# Patient Record
Sex: Female | Born: 1950 | Race: White | Hispanic: No | Smoking: Former smoker
Health system: Southern US, Community
[De-identification: ages and names within clinical notes are randomized; demographics above are authoritative.]

## PROBLEM LIST (undated history)

## (undated) DIAGNOSIS — I10 Essential (primary) hypertension: Secondary | ICD-10-CM

## (undated) DIAGNOSIS — Z85118 Personal history of other malignant neoplasm of bronchus and lung: Secondary | ICD-10-CM

## (undated) DIAGNOSIS — I350 Nonrheumatic aortic (valve) stenosis: Principal | ICD-10-CM

## (undated) DIAGNOSIS — R0600 Dyspnea, unspecified: Secondary | ICD-10-CM

## (undated) DIAGNOSIS — M545 Low back pain, unspecified: Secondary | ICD-10-CM

## (undated) DIAGNOSIS — I499 Cardiac arrhythmia, unspecified: Secondary | ICD-10-CM

## (undated) DIAGNOSIS — S99921A Unspecified injury of right foot, initial encounter: Secondary | ICD-10-CM

## (undated) DIAGNOSIS — E785 Hyperlipidemia, unspecified: Secondary | ICD-10-CM

## (undated) HISTORY — DX: Essential (primary) hypertension: I10

## (undated) HISTORY — PX: ANKLE FUSION: SHX881

## (undated) HISTORY — PX: TUBAL LIGATION: SHX77

## (undated) HISTORY — DX: Hyperlipidemia, unspecified: E78.5

## (undated) HISTORY — DX: Morbid (severe) obesity due to excess calories: E66.01

## (undated) HISTORY — DX: Low back pain, unspecified: M54.50

## (undated) HISTORY — DX: Nonrheumatic aortic (valve) stenosis: I35.0

## (undated) HISTORY — PX: TEE WITHOUT CARDIOVERSION: SHX5443

## (undated) HISTORY — DX: Low back pain: M54.5

## (undated) HISTORY — PX: CHOLECYSTECTOMY: SHX55

## (undated) HISTORY — PX: LUNG REMOVAL, PARTIAL: SHX233

## (undated) HISTORY — DX: Personal history of other malignant neoplasm of bronchus and lung: Z85.118

---

## 1997-08-20 ENCOUNTER — Other Ambulatory Visit: Admission: RE | Admit: 1997-08-20 | Discharge: 1997-08-20 | Payer: Self-pay | Admitting: Gynecology

## 1998-11-11 ENCOUNTER — Other Ambulatory Visit: Admission: RE | Admit: 1998-11-11 | Discharge: 1998-11-11 | Payer: Self-pay | Admitting: Gynecology

## 1999-10-28 ENCOUNTER — Other Ambulatory Visit: Admission: RE | Admit: 1999-10-28 | Discharge: 1999-10-28 | Payer: Self-pay | Admitting: Gynecology

## 1999-10-28 ENCOUNTER — Encounter (INDEPENDENT_AMBULATORY_CARE_PROVIDER_SITE_OTHER): Payer: Self-pay

## 1999-11-24 ENCOUNTER — Other Ambulatory Visit: Admission: RE | Admit: 1999-11-24 | Discharge: 1999-11-24 | Payer: Self-pay | Admitting: Gynecology

## 2000-07-05 ENCOUNTER — Other Ambulatory Visit: Admission: RE | Admit: 2000-07-05 | Discharge: 2000-07-05 | Payer: Self-pay | Admitting: Gynecology

## 2000-12-19 ENCOUNTER — Other Ambulatory Visit: Admission: RE | Admit: 2000-12-19 | Discharge: 2000-12-19 | Payer: Self-pay | Admitting: Gynecology

## 2001-07-13 ENCOUNTER — Encounter: Admission: RE | Admit: 2001-07-13 | Discharge: 2001-07-13 | Payer: Self-pay | Admitting: Family Medicine

## 2001-07-13 ENCOUNTER — Encounter: Payer: Self-pay | Admitting: Family Medicine

## 2002-01-08 ENCOUNTER — Other Ambulatory Visit: Admission: RE | Admit: 2002-01-08 | Discharge: 2002-01-08 | Payer: Self-pay | Admitting: Gynecology

## 2002-06-22 ENCOUNTER — Encounter: Admission: RE | Admit: 2002-06-22 | Discharge: 2002-06-22 | Payer: Self-pay | Admitting: Family Medicine

## 2002-06-22 ENCOUNTER — Encounter: Payer: Self-pay | Admitting: Family Medicine

## 2003-07-25 ENCOUNTER — Other Ambulatory Visit: Admission: RE | Admit: 2003-07-25 | Discharge: 2003-07-25 | Payer: Self-pay | Admitting: Gynecology

## 2003-08-27 ENCOUNTER — Encounter: Admission: RE | Admit: 2003-08-27 | Discharge: 2003-08-27 | Payer: Self-pay | Admitting: Gynecology

## 2004-04-13 ENCOUNTER — Observation Stay (HOSPITAL_COMMUNITY): Admission: RE | Admit: 2004-04-13 | Discharge: 2004-04-14 | Payer: Self-pay | Admitting: General Surgery

## 2004-04-13 ENCOUNTER — Encounter (INDEPENDENT_AMBULATORY_CARE_PROVIDER_SITE_OTHER): Payer: Self-pay | Admitting: *Deleted

## 2004-09-07 ENCOUNTER — Other Ambulatory Visit: Admission: RE | Admit: 2004-09-07 | Discharge: 2004-09-07 | Payer: Self-pay | Admitting: Gynecology

## 2004-09-07 ENCOUNTER — Encounter: Admission: RE | Admit: 2004-09-07 | Discharge: 2004-09-07 | Payer: Self-pay | Admitting: Gynecology

## 2004-12-17 ENCOUNTER — Ambulatory Visit (HOSPITAL_COMMUNITY): Admission: RE | Admit: 2004-12-17 | Discharge: 2004-12-17 | Payer: Self-pay | Admitting: Family Medicine

## 2005-11-09 ENCOUNTER — Other Ambulatory Visit: Admission: RE | Admit: 2005-11-09 | Discharge: 2005-11-09 | Payer: Self-pay | Admitting: Gynecology

## 2006-01-10 ENCOUNTER — Ambulatory Visit: Payer: Self-pay | Admitting: Family Medicine

## 2006-02-14 ENCOUNTER — Ambulatory Visit: Payer: Self-pay | Admitting: Family Medicine

## 2006-03-04 ENCOUNTER — Ambulatory Visit: Payer: Self-pay | Admitting: Internal Medicine

## 2006-04-14 ENCOUNTER — Ambulatory Visit: Payer: Self-pay | Admitting: Family Medicine

## 2006-04-14 LAB — CONVERTED CEMR LAB
ALT: 29 units/L (ref 0–40)
Chloride: 104 meq/L (ref 96–112)
Chol/HDL Ratio, serum: 3.1
Cholesterol: 137 mg/dL (ref 0–200)
GFR calc non Af Amer: 69 mL/min
Glomerular Filtration Rate, Af Am: 83 mL/min/{1.73_m2}
Glucose, Bld: 91 mg/dL (ref 70–99)
HDL: 44.1 mg/dL (ref >39.0)
LDL Cholesterol: 78 mg/dL (ref 0–99)
Potassium: 4.2 meq/L (ref 3.5–5.1)
Sodium: 142 meq/L (ref 135–145)
VLDL: 15 mg/dL (ref 0–40)

## 2006-05-06 ENCOUNTER — Ambulatory Visit: Payer: Self-pay | Admitting: Family Medicine

## 2006-05-10 ENCOUNTER — Ambulatory Visit: Payer: Self-pay | Admitting: Cardiology

## 2006-09-15 ENCOUNTER — Encounter: Payer: Self-pay | Admitting: Family Medicine

## 2006-09-15 ENCOUNTER — Encounter (INDEPENDENT_AMBULATORY_CARE_PROVIDER_SITE_OTHER): Payer: Self-pay | Admitting: Family Medicine

## 2006-09-15 ENCOUNTER — Ambulatory Visit: Payer: Self-pay | Admitting: Family Medicine

## 2006-09-15 DIAGNOSIS — E785 Hyperlipidemia, unspecified: Secondary | ICD-10-CM | POA: Insufficient documentation

## 2006-09-15 DIAGNOSIS — I10 Essential (primary) hypertension: Secondary | ICD-10-CM | POA: Insufficient documentation

## 2006-09-15 DIAGNOSIS — M545 Low back pain, unspecified: Secondary | ICD-10-CM | POA: Insufficient documentation

## 2006-11-01 ENCOUNTER — Encounter (INDEPENDENT_AMBULATORY_CARE_PROVIDER_SITE_OTHER): Payer: Self-pay | Admitting: Family Medicine

## 2006-11-01 ENCOUNTER — Other Ambulatory Visit: Admission: RE | Admit: 2006-11-01 | Discharge: 2006-11-01 | Payer: Self-pay | Admitting: Gynecology

## 2006-11-08 ENCOUNTER — Ambulatory Visit: Payer: Self-pay | Admitting: Family Medicine

## 2006-11-09 LAB — CONVERTED CEMR LAB
ALT: 35 units/L (ref 0–35)
AST: 38 units/L — ABNORMAL HIGH (ref 0–37)
BUN: 15 mg/dL (ref 6–23)
CO2: 33 meq/L — ABNORMAL HIGH (ref 19–32)
Calcium: 9.2 mg/dL (ref 8.4–10.5)
Free T4: 0.9 ng/dL (ref 0.6–1.6)
GFR calc Af Amer: 83 mL/min
GFR calc non Af Amer: 69 mL/min
LDL Cholesterol: 66 mg/dL (ref 0–99)
Potassium: 3.3 meq/L — ABNORMAL LOW (ref 3.5–5.1)
Total CHOL/HDL Ratio: 3.1
Triglycerides: 105 mg/dL (ref 0–149)
VLDL: 21 mg/dL (ref 0–40)

## 2006-11-10 ENCOUNTER — Telehealth (INDEPENDENT_AMBULATORY_CARE_PROVIDER_SITE_OTHER): Payer: Self-pay | Admitting: *Deleted

## 2006-11-15 ENCOUNTER — Ambulatory Visit: Payer: Self-pay | Admitting: Cardiology

## 2006-11-16 ENCOUNTER — Telehealth (INDEPENDENT_AMBULATORY_CARE_PROVIDER_SITE_OTHER): Payer: Self-pay | Admitting: *Deleted

## 2006-11-16 DIAGNOSIS — R599 Enlarged lymph nodes, unspecified: Secondary | ICD-10-CM | POA: Insufficient documentation

## 2006-11-17 ENCOUNTER — Ambulatory Visit: Payer: Self-pay | Admitting: Family Medicine

## 2006-11-21 LAB — CONVERTED CEMR LAB: Potassium: 3.8 meq/L (ref 3.5–5.1)

## 2006-12-26 ENCOUNTER — Telehealth (INDEPENDENT_AMBULATORY_CARE_PROVIDER_SITE_OTHER): Payer: Self-pay | Admitting: *Deleted

## 2006-12-29 ENCOUNTER — Ambulatory Visit: Payer: Self-pay | Admitting: Family Medicine

## 2007-01-03 ENCOUNTER — Telehealth (INDEPENDENT_AMBULATORY_CARE_PROVIDER_SITE_OTHER): Payer: Self-pay | Admitting: *Deleted

## 2007-02-02 ENCOUNTER — Ambulatory Visit: Payer: Self-pay | Admitting: Family Medicine

## 2007-03-09 ENCOUNTER — Encounter (INDEPENDENT_AMBULATORY_CARE_PROVIDER_SITE_OTHER): Payer: Self-pay | Admitting: Family Medicine

## 2007-04-06 ENCOUNTER — Ambulatory Visit: Payer: Self-pay | Admitting: Family Medicine

## 2007-04-20 ENCOUNTER — Ambulatory Visit: Payer: Self-pay | Admitting: Family Medicine

## 2007-04-20 LAB — CONVERTED CEMR LAB
ALT: 49 units/L — ABNORMAL HIGH (ref 0–35)
Creatinine, Ser: 0.9 mg/dL (ref 0.4–1.2)
GFR calc Af Amer: 83 mL/min
HDL: 31.6 mg/dL — ABNORMAL LOW (ref >39.0)
Potassium: 4.1 meq/L (ref 3.5–5.1)
Sodium: 143 meq/L (ref 135–145)
Total CHOL/HDL Ratio: 4.7
Triglycerides: 108 mg/dL (ref 0–149)
VLDL: 22 mg/dL (ref 0–40)

## 2007-04-21 ENCOUNTER — Encounter (INDEPENDENT_AMBULATORY_CARE_PROVIDER_SITE_OTHER): Payer: Self-pay | Admitting: *Deleted

## 2007-07-07 ENCOUNTER — Encounter (INDEPENDENT_AMBULATORY_CARE_PROVIDER_SITE_OTHER): Payer: Self-pay | Admitting: *Deleted

## 2007-07-07 ENCOUNTER — Ambulatory Visit: Payer: Self-pay | Admitting: Family Medicine

## 2007-07-07 DIAGNOSIS — B353 Tinea pedis: Secondary | ICD-10-CM | POA: Insufficient documentation

## 2007-07-11 ENCOUNTER — Telehealth (INDEPENDENT_AMBULATORY_CARE_PROVIDER_SITE_OTHER): Payer: Self-pay | Admitting: *Deleted

## 2007-07-27 ENCOUNTER — Encounter: Payer: Self-pay | Admitting: Family Medicine

## 2007-07-31 ENCOUNTER — Telehealth (INDEPENDENT_AMBULATORY_CARE_PROVIDER_SITE_OTHER): Payer: Self-pay | Admitting: *Deleted

## 2007-08-28 ENCOUNTER — Telehealth (INDEPENDENT_AMBULATORY_CARE_PROVIDER_SITE_OTHER): Payer: Self-pay | Admitting: *Deleted

## 2007-09-05 ENCOUNTER — Telehealth (INDEPENDENT_AMBULATORY_CARE_PROVIDER_SITE_OTHER): Payer: Self-pay | Admitting: *Deleted

## 2007-09-07 ENCOUNTER — Ambulatory Visit: Payer: Self-pay | Admitting: Family Medicine

## 2007-09-13 ENCOUNTER — Encounter (INDEPENDENT_AMBULATORY_CARE_PROVIDER_SITE_OTHER): Payer: Self-pay | Admitting: *Deleted

## 2007-09-13 LAB — CONVERTED CEMR LAB: Vit D, 1,25-Dihydroxy: 20 — ABNORMAL LOW (ref 30–89)

## 2007-09-15 ENCOUNTER — Telehealth (INDEPENDENT_AMBULATORY_CARE_PROVIDER_SITE_OTHER): Payer: Self-pay | Admitting: *Deleted

## 2007-09-16 LAB — CONVERTED CEMR LAB: AST: 37 units/L (ref 0–37)

## 2007-09-18 ENCOUNTER — Encounter (INDEPENDENT_AMBULATORY_CARE_PROVIDER_SITE_OTHER): Payer: Self-pay | Admitting: *Deleted

## 2007-10-24 ENCOUNTER — Encounter: Payer: Self-pay | Admitting: Family Medicine

## 2007-11-07 ENCOUNTER — Ambulatory Visit: Payer: Self-pay | Admitting: Family Medicine

## 2007-11-07 ENCOUNTER — Other Ambulatory Visit: Admission: RE | Admit: 2007-11-07 | Discharge: 2007-11-07 | Payer: Self-pay | Admitting: Gynecology

## 2007-11-07 DIAGNOSIS — E559 Vitamin D deficiency, unspecified: Secondary | ICD-10-CM

## 2007-11-09 ENCOUNTER — Telehealth (INDEPENDENT_AMBULATORY_CARE_PROVIDER_SITE_OTHER): Payer: Self-pay | Admitting: *Deleted

## 2007-11-09 LAB — CONVERTED CEMR LAB: Vit D, 1,25-Dihydroxy: 18 — ABNORMAL LOW (ref 30–89)

## 2007-11-13 ENCOUNTER — Ambulatory Visit: Payer: Self-pay | Admitting: Family Medicine

## 2007-11-13 DIAGNOSIS — R05 Cough: Secondary | ICD-10-CM

## 2007-11-13 DIAGNOSIS — R059 Cough, unspecified: Secondary | ICD-10-CM | POA: Insufficient documentation

## 2007-11-13 DIAGNOSIS — E538 Deficiency of other specified B group vitamins: Secondary | ICD-10-CM | POA: Insufficient documentation

## 2007-11-13 LAB — CONVERTED CEMR LAB
Calcium: 9.2 mg/dL (ref 8.4–10.5)
Chloride: 98 meq/L (ref 96–112)
Creatinine, Ser: 1 mg/dL (ref 0.4–1.2)
Folate: 7.3 ng/mL
GFR calc non Af Amer: 61 mL/min
Sodium: 138 meq/L (ref 135–145)
TSH: 2.6 microintl units/mL (ref 0.35–5.50)
Total Bilirubin: 0.7 mg/dL (ref 0.3–1.2)
Vitamin B-12: 277 pg/mL (ref 211–911)

## 2007-11-14 ENCOUNTER — Ambulatory Visit: Payer: Self-pay | Admitting: Family Medicine

## 2007-11-20 ENCOUNTER — Ambulatory Visit: Payer: Self-pay | Admitting: Family Medicine

## 2007-11-23 ENCOUNTER — Ambulatory Visit: Payer: Self-pay | Admitting: Family Medicine

## 2007-11-24 ENCOUNTER — Encounter (INDEPENDENT_AMBULATORY_CARE_PROVIDER_SITE_OTHER): Payer: Self-pay | Admitting: *Deleted

## 2007-11-25 LAB — CONVERTED CEMR LAB
Basophils Absolute: 0 10*3/uL (ref 0.0–0.1)
Bilirubin, Direct: 0.1 mg/dL (ref 0.0–0.3)
Calcium: 9.5 mg/dL (ref 8.4–10.5)
Cholesterol: 178 mg/dL (ref 0–200)
Eosinophils Absolute: 0.1 10*3/uL (ref 0.0–0.7)
GFR calc Af Amer: 83 mL/min
Glucose, Bld: 100 mg/dL — ABNORMAL HIGH (ref 70–99)
HCT: 38.4 % (ref 36.0–46.0)
Hemoglobin: 13.2 g/dL (ref 12.0–15.0)
MCHC: 34.4 g/dL (ref 30.0–36.0)
MCV: 95.8 fL (ref 78.0–100.0)
Monocytes Absolute: 0.5 10*3/uL (ref 0.1–1.0)
Monocytes Relative: 7.1 % (ref 3.0–12.0)
Neutro Abs: 4.4 10*3/uL (ref 1.4–7.7)
RDW: 12.2 % (ref 11.5–14.6)
Sodium: 141 meq/L (ref 135–145)
Total Protein: 7.2 g/dL (ref 6.0–8.3)
Triglycerides: 179 mg/dL — ABNORMAL HIGH (ref 0–149)

## 2007-11-27 ENCOUNTER — Ambulatory Visit: Payer: Self-pay | Admitting: Family Medicine

## 2007-11-27 ENCOUNTER — Encounter (INDEPENDENT_AMBULATORY_CARE_PROVIDER_SITE_OTHER): Payer: Self-pay | Admitting: *Deleted

## 2007-12-04 ENCOUNTER — Ambulatory Visit: Payer: Self-pay | Admitting: Family Medicine

## 2007-12-11 ENCOUNTER — Telehealth (INDEPENDENT_AMBULATORY_CARE_PROVIDER_SITE_OTHER): Payer: Self-pay | Admitting: *Deleted

## 2007-12-11 ENCOUNTER — Ambulatory Visit: Payer: Self-pay | Admitting: Family Medicine

## 2007-12-12 ENCOUNTER — Encounter (INDEPENDENT_AMBULATORY_CARE_PROVIDER_SITE_OTHER): Payer: Self-pay | Admitting: *Deleted

## 2007-12-12 LAB — CONVERTED CEMR LAB
Folate: 8.6 ng/mL
Vitamin B-12: 696 pg/mL (ref 211–911)

## 2008-01-15 ENCOUNTER — Telehealth (INDEPENDENT_AMBULATORY_CARE_PROVIDER_SITE_OTHER): Payer: Self-pay | Admitting: *Deleted

## 2008-02-26 ENCOUNTER — Ambulatory Visit: Payer: Self-pay | Admitting: Family Medicine

## 2008-02-28 ENCOUNTER — Encounter (INDEPENDENT_AMBULATORY_CARE_PROVIDER_SITE_OTHER): Payer: Self-pay | Admitting: *Deleted

## 2008-03-04 ENCOUNTER — Telehealth (INDEPENDENT_AMBULATORY_CARE_PROVIDER_SITE_OTHER): Payer: Self-pay | Admitting: *Deleted

## 2008-04-01 ENCOUNTER — Ambulatory Visit: Payer: Self-pay | Admitting: Family Medicine

## 2008-04-01 DIAGNOSIS — Z8639 Personal history of other endocrine, nutritional and metabolic disease: Secondary | ICD-10-CM

## 2008-04-01 DIAGNOSIS — Z862 Personal history of diseases of the blood and blood-forming organs and certain disorders involving the immune mechanism: Secondary | ICD-10-CM

## 2008-04-02 LAB — CONVERTED CEMR LAB
ALT: 54 units/L — ABNORMAL HIGH (ref 0–35)
AST: 51 units/L — ABNORMAL HIGH (ref 0–37)
Albumin: 4 g/dL (ref 3.5–5.2)
Alkaline Phosphatase: 62 units/L (ref 39–117)
BUN: 26 mg/dL — ABNORMAL HIGH (ref 6–23)
CO2: 31 meq/L (ref 19–32)
Chloride: 101 meq/L (ref 96–112)
Glucose, Bld: 112 mg/dL — ABNORMAL HIGH (ref 70–99)
Potassium: 4.1 meq/L (ref 3.5–5.1)
Total Protein: 7.6 g/dL (ref 6.0–8.3)

## 2008-04-03 ENCOUNTER — Encounter (INDEPENDENT_AMBULATORY_CARE_PROVIDER_SITE_OTHER): Payer: Self-pay | Admitting: *Deleted

## 2008-04-03 ENCOUNTER — Telehealth (INDEPENDENT_AMBULATORY_CARE_PROVIDER_SITE_OTHER): Payer: Self-pay | Admitting: *Deleted

## 2008-04-05 ENCOUNTER — Encounter (INDEPENDENT_AMBULATORY_CARE_PROVIDER_SITE_OTHER): Payer: Self-pay | Admitting: *Deleted

## 2008-04-15 ENCOUNTER — Ambulatory Visit: Payer: Self-pay | Admitting: Cardiovascular Disease

## 2008-04-15 ENCOUNTER — Encounter (INDEPENDENT_AMBULATORY_CARE_PROVIDER_SITE_OTHER): Payer: Self-pay | Admitting: *Deleted

## 2008-04-18 ENCOUNTER — Ambulatory Visit: Payer: Self-pay | Admitting: Family Medicine

## 2008-04-18 DIAGNOSIS — J984 Other disorders of lung: Secondary | ICD-10-CM | POA: Insufficient documentation

## 2008-05-02 ENCOUNTER — Ambulatory Visit: Payer: Self-pay | Admitting: Family Medicine

## 2008-05-16 ENCOUNTER — Ambulatory Visit: Payer: Self-pay | Admitting: Internal Medicine

## 2008-05-20 ENCOUNTER — Encounter (INDEPENDENT_AMBULATORY_CARE_PROVIDER_SITE_OTHER): Payer: Self-pay | Admitting: *Deleted

## 2008-06-20 ENCOUNTER — Ambulatory Visit: Payer: Self-pay | Admitting: Family Medicine

## 2008-06-24 LAB — CONVERTED CEMR LAB
ALT: 46 units/L — ABNORMAL HIGH (ref 0–35)
AST: 46 units/L — ABNORMAL HIGH (ref 0–37)
Bilirubin, Direct: 0.1 mg/dL (ref 0.0–0.3)
CO2: 31 meq/L (ref 19–32)
Calcium: 9.6 mg/dL (ref 8.4–10.5)
Chloride: 103 meq/L (ref 96–112)
GFR calc non Af Amer: 91 mL/min
Sodium: 141 meq/L (ref 135–145)
Total Bilirubin: 0.9 mg/dL (ref 0.3–1.2)
Total CHOL/HDL Ratio: 5

## 2008-06-25 ENCOUNTER — Encounter (INDEPENDENT_AMBULATORY_CARE_PROVIDER_SITE_OTHER): Payer: Self-pay | Admitting: *Deleted

## 2008-07-11 ENCOUNTER — Ambulatory Visit: Payer: Self-pay | Admitting: Family Medicine

## 2008-07-11 DIAGNOSIS — K5289 Other specified noninfective gastroenteritis and colitis: Secondary | ICD-10-CM

## 2008-07-29 ENCOUNTER — Encounter (INDEPENDENT_AMBULATORY_CARE_PROVIDER_SITE_OTHER): Payer: Self-pay | Admitting: *Deleted

## 2008-07-29 LAB — CONVERTED CEMR LAB
Albumin: 3.9 g/dL (ref 3.5–5.2)
Alkaline Phosphatase: 66 units/L (ref 39–117)
BUN: 21 mg/dL (ref 6–23)
Calcium: 9.3 mg/dL (ref 8.4–10.5)
Creatinine, Ser: 1 mg/dL (ref 0.4–1.2)
Eosinophils Absolute: 0.3 10*3/uL (ref 0.0–0.7)
Eosinophils Relative: 3.1 % (ref 0.0–5.0)
GFR calc Af Amer: 73 mL/min
GFR calc non Af Amer: 61 mL/min
Glucose, Bld: 88 mg/dL (ref 70–99)
HCT: 36.9 % (ref 36.0–46.0)
Hemoglobin: 12.6 g/dL (ref 12.0–15.0)
MCV: 96.8 fL (ref 78.0–100.0)
Monocytes Absolute: 0.3 10*3/uL (ref 0.1–1.0)
Neutro Abs: 5.5 10*3/uL (ref 1.4–7.7)
Platelets: 181 10*3/uL (ref 150–400)
Potassium: 4.4 meq/L (ref 3.5–5.1)
TSH: 1.54 microintl units/mL (ref 0.35–5.50)
Total Protein: 7.3 g/dL (ref 6.0–8.3)
WBC: 9.4 10*3/uL (ref 4.5–10.5)

## 2008-08-28 ENCOUNTER — Telehealth (INDEPENDENT_AMBULATORY_CARE_PROVIDER_SITE_OTHER): Payer: Self-pay | Admitting: *Deleted

## 2008-08-28 ENCOUNTER — Ambulatory Visit: Payer: Self-pay | Admitting: Family Medicine

## 2008-09-11 ENCOUNTER — Telehealth (INDEPENDENT_AMBULATORY_CARE_PROVIDER_SITE_OTHER): Payer: Self-pay | Admitting: *Deleted

## 2008-09-18 ENCOUNTER — Ambulatory Visit: Payer: Self-pay | Admitting: Cardiology

## 2008-09-20 ENCOUNTER — Ambulatory Visit: Payer: Self-pay | Admitting: Internal Medicine

## 2008-09-23 ENCOUNTER — Encounter: Payer: Self-pay | Admitting: Internal Medicine

## 2008-09-23 ENCOUNTER — Ambulatory Visit (HOSPITAL_COMMUNITY): Admission: RE | Admit: 2008-09-23 | Discharge: 2008-09-23 | Payer: Self-pay | Admitting: Internal Medicine

## 2008-09-26 ENCOUNTER — Ambulatory Visit: Payer: Self-pay | Admitting: Thoracic Surgery

## 2008-09-26 ENCOUNTER — Ambulatory Visit (HOSPITAL_COMMUNITY): Admission: RE | Admit: 2008-09-26 | Discharge: 2008-09-26 | Payer: Self-pay | Admitting: Thoracic Surgery

## 2008-09-27 ENCOUNTER — Ambulatory Visit: Payer: Self-pay | Admitting: Family Medicine

## 2008-10-01 DIAGNOSIS — Z85118 Personal history of other malignant neoplasm of bronchus and lung: Secondary | ICD-10-CM

## 2008-10-01 HISTORY — DX: Personal history of other malignant neoplasm of bronchus and lung: Z85.118

## 2008-10-18 ENCOUNTER — Encounter: Payer: Self-pay | Admitting: Thoracic Surgery

## 2008-10-18 ENCOUNTER — Inpatient Hospital Stay (HOSPITAL_COMMUNITY): Admission: RE | Admit: 2008-10-18 | Discharge: 2008-10-24 | Payer: Self-pay | Admitting: Thoracic Surgery

## 2008-10-18 ENCOUNTER — Ambulatory Visit: Payer: Self-pay | Admitting: Thoracic Surgery

## 2008-10-30 ENCOUNTER — Ambulatory Visit: Payer: Self-pay | Admitting: Thoracic Surgery

## 2008-10-30 ENCOUNTER — Encounter: Admission: RE | Admit: 2008-10-30 | Discharge: 2008-10-30 | Payer: Self-pay | Admitting: Thoracic Surgery

## 2008-10-31 ENCOUNTER — Ambulatory Visit: Payer: Self-pay | Admitting: Internal Medicine

## 2008-11-12 ENCOUNTER — Encounter: Payer: Self-pay | Admitting: Family Medicine

## 2008-11-12 LAB — COMPREHENSIVE METABOLIC PANEL
CO2: 25 mEq/L (ref 19–32)
Calcium: 9.3 mg/dL (ref 8.4–10.5)
Glucose, Bld: 116 mg/dL — ABNORMAL HIGH (ref 70–99)
Sodium: 141 mEq/L (ref 135–145)
Total Bilirubin: 0.5 mg/dL (ref 0.3–1.2)
Total Protein: 7.7 g/dL (ref 6.0–8.3)

## 2008-11-12 LAB — CBC WITH DIFFERENTIAL/PLATELET
Eosinophils Absolute: 0.5 10*3/uL (ref 0.0–0.5)
HCT: 38.1 % (ref 34.8–46.6)
LYMPH%: 32.7 % (ref 14.0–49.7)
MONO#: 0.4 10*3/uL (ref 0.1–0.9)
NEUT#: 2.9 10*3/uL (ref 1.5–6.5)
NEUT%: 51.8 % (ref 38.4–76.8)
Platelets: 193 10*3/uL (ref 145–400)
WBC: 5.7 10*3/uL (ref 3.9–10.3)

## 2008-11-13 ENCOUNTER — Encounter: Admission: RE | Admit: 2008-11-13 | Discharge: 2008-11-13 | Payer: Self-pay | Admitting: Thoracic Surgery

## 2008-11-13 ENCOUNTER — Ambulatory Visit: Payer: Self-pay | Admitting: Thoracic Surgery

## 2008-11-19 ENCOUNTER — Ambulatory Visit (HOSPITAL_COMMUNITY): Admission: RE | Admit: 2008-11-19 | Discharge: 2008-11-19 | Payer: Self-pay | Admitting: Internal Medicine

## 2008-11-25 ENCOUNTER — Ambulatory Visit: Payer: Self-pay | Admitting: Family Medicine

## 2008-11-25 LAB — CONVERTED CEMR LAB
Alkaline Phosphatase: 57 units/L (ref 39–117)
Bilirubin, Direct: 0 mg/dL (ref 0.0–0.3)
Total Bilirubin: 0.8 mg/dL (ref 0.3–1.2)

## 2008-11-26 ENCOUNTER — Encounter (INDEPENDENT_AMBULATORY_CARE_PROVIDER_SITE_OTHER): Payer: Self-pay | Admitting: *Deleted

## 2008-12-01 LAB — CONVERTED CEMR LAB
HCV Ab: NEGATIVE
Hep A IgM: NEGATIVE
Hep B C IgM: NEGATIVE
Hepatitis B Surface Ag: NEGATIVE
Vit D, 25-Hydroxy: 40 ng/mL (ref 30–89)

## 2008-12-03 ENCOUNTER — Encounter (INDEPENDENT_AMBULATORY_CARE_PROVIDER_SITE_OTHER): Payer: Self-pay | Admitting: *Deleted

## 2008-12-24 ENCOUNTER — Ambulatory Visit: Payer: Self-pay | Admitting: Thoracic Surgery

## 2008-12-24 ENCOUNTER — Encounter: Admission: RE | Admit: 2008-12-24 | Discharge: 2008-12-24 | Payer: Self-pay | Admitting: Thoracic Surgery

## 2009-01-08 ENCOUNTER — Telehealth (INDEPENDENT_AMBULATORY_CARE_PROVIDER_SITE_OTHER): Payer: Self-pay | Admitting: *Deleted

## 2009-03-04 ENCOUNTER — Encounter: Admission: RE | Admit: 2009-03-04 | Discharge: 2009-03-04 | Payer: Self-pay | Admitting: Thoracic Surgery

## 2009-03-04 ENCOUNTER — Ambulatory Visit: Payer: Self-pay | Admitting: Thoracic Surgery

## 2009-03-17 ENCOUNTER — Ambulatory Visit: Payer: Self-pay | Admitting: Family Medicine

## 2009-03-17 DIAGNOSIS — M25569 Pain in unspecified knee: Secondary | ICD-10-CM | POA: Insufficient documentation

## 2009-04-23 ENCOUNTER — Ambulatory Visit: Payer: Self-pay | Admitting: Internal Medicine

## 2009-04-29 ENCOUNTER — Ambulatory Visit (HOSPITAL_COMMUNITY): Admission: RE | Admit: 2009-04-29 | Discharge: 2009-04-29 | Payer: Self-pay | Admitting: Internal Medicine

## 2009-04-29 LAB — COMPREHENSIVE METABOLIC PANEL
CO2: 29 mEq/L (ref 19–32)
Creatinine, Ser: 0.95 mg/dL (ref 0.40–1.20)
Glucose, Bld: 112 mg/dL — ABNORMAL HIGH (ref 70–99)
Total Bilirubin: 0.8 mg/dL (ref 0.3–1.2)

## 2009-04-29 LAB — CBC WITH DIFFERENTIAL/PLATELET
BASO%: 0.5 % (ref 0.0–2.0)
EOS%: 2.7 % (ref 0.0–7.0)
HCT: 37.8 % (ref 34.8–46.6)
LYMPH%: 34 % (ref 14.0–49.7)
MCH: 32.3 pg (ref 25.1–34.0)
MCHC: 34 g/dL (ref 31.5–36.0)
MONO%: 6.9 % (ref 0.0–14.0)
NEUT%: 55.9 % (ref 38.4–76.8)
lymph#: 2.8 10*3/uL (ref 0.9–3.3)

## 2009-05-01 ENCOUNTER — Encounter: Payer: Self-pay | Admitting: Family Medicine

## 2009-05-07 ENCOUNTER — Ambulatory Visit: Payer: Self-pay | Admitting: Thoracic Surgery

## 2009-05-08 ENCOUNTER — Ambulatory Visit: Payer: Self-pay | Admitting: Family Medicine

## 2009-05-13 LAB — CONVERTED CEMR LAB: Vit D, 25-Hydroxy: 50 ng/mL (ref 30–89)

## 2009-07-29 ENCOUNTER — Ambulatory Visit: Payer: Self-pay | Admitting: Family Medicine

## 2009-07-30 ENCOUNTER — Telehealth (INDEPENDENT_AMBULATORY_CARE_PROVIDER_SITE_OTHER): Payer: Self-pay | Admitting: *Deleted

## 2009-07-30 LAB — CONVERTED CEMR LAB
ALT: 62 units/L — ABNORMAL HIGH (ref 0–35)
AST: 57 units/L — ABNORMAL HIGH (ref 0–37)
BUN: 17 mg/dL (ref 6–23)
Basophils Relative: 1.1 % (ref 0.0–3.0)
Bilirubin, Direct: 0.1 mg/dL (ref 0.0–0.3)
Chloride: 103 meq/L (ref 96–112)
Cholesterol: 134 mg/dL (ref 0–200)
Creatinine, Ser: 0.9 mg/dL (ref 0.4–1.2)
Eosinophils Relative: 2.7 % (ref 0.0–5.0)
GFR calc non Af Amer: 67.93 mL/min (ref >60)
HCT: 39.7 % (ref 36.0–46.0)
LDL Cholesterol: 67 mg/dL (ref 0–99)
MCV: 97.2 fL (ref 78.0–100.0)
Monocytes Relative: 7 % (ref 3.0–12.0)
Neutrophils Relative %: 59.9 % (ref 43.0–77.0)
Platelets: 166 10*3/uL (ref 150.0–400.0)
Potassium: 3.7 meq/L (ref 3.5–5.1)
RBC: 4.09 M/uL (ref 3.87–5.11)
Total Bilirubin: 0.6 mg/dL (ref 0.3–1.2)
Total CHOL/HDL Ratio: 3
Total Protein: 8.4 g/dL — ABNORMAL HIGH (ref 6.0–8.3)
VLDL: 22.8 mg/dL (ref 0.0–40.0)
WBC: 6.7 10*3/uL (ref 4.5–10.5)

## 2009-10-09 ENCOUNTER — Ambulatory Visit: Payer: Self-pay | Admitting: Internal Medicine

## 2009-10-13 ENCOUNTER — Ambulatory Visit (HOSPITAL_COMMUNITY): Admission: RE | Admit: 2009-10-13 | Discharge: 2009-10-13 | Payer: Self-pay | Admitting: Internal Medicine

## 2009-10-13 LAB — CBC WITH DIFFERENTIAL/PLATELET
Basophils Absolute: 0 10*3/uL (ref 0.0–0.1)
Eosinophils Absolute: 0.2 10*3/uL (ref 0.0–0.5)
HCT: 39.2 % (ref 34.8–46.6)
LYMPH%: 34.6 % (ref 14.0–49.7)
MCHC: 34.7 g/dL (ref 31.5–36.0)
MONO#: 0.6 10*3/uL (ref 0.1–0.9)
NEUT%: 54.5 % (ref 38.4–76.8)
Platelets: 192 10*3/uL (ref 145–400)
WBC: 7.2 10*3/uL (ref 3.9–10.3)

## 2009-10-13 LAB — COMPREHENSIVE METABOLIC PANEL
BUN: 24 mg/dL — ABNORMAL HIGH (ref 6–23)
CO2: 33 mEq/L — ABNORMAL HIGH (ref 19–32)
Creatinine, Ser: 0.88 mg/dL (ref 0.40–1.20)
Glucose, Bld: 107 mg/dL — ABNORMAL HIGH (ref 70–99)
Total Bilirubin: 0.7 mg/dL (ref 0.3–1.2)

## 2009-10-15 ENCOUNTER — Ambulatory Visit: Payer: Self-pay | Admitting: Thoracic Surgery

## 2009-10-16 ENCOUNTER — Encounter: Payer: Self-pay | Admitting: Family Medicine

## 2009-10-23 ENCOUNTER — Encounter: Payer: Self-pay | Admitting: Family Medicine

## 2010-01-28 ENCOUNTER — Telehealth (INDEPENDENT_AMBULATORY_CARE_PROVIDER_SITE_OTHER): Payer: Self-pay | Admitting: *Deleted

## 2010-02-06 ENCOUNTER — Ambulatory Visit: Payer: Self-pay | Admitting: Family Medicine

## 2010-02-09 LAB — CONVERTED CEMR LAB
BUN: 22 mg/dL (ref 6–23)
Bilirubin, Direct: 0.1 mg/dL (ref 0.0–0.3)
CO2: 34 meq/L — ABNORMAL HIGH (ref 19–32)
Chloride: 103 meq/L (ref 96–112)
Glucose, Bld: 94 mg/dL (ref 70–99)
Potassium: 4.5 meq/L (ref 3.5–5.1)
Total Bilirubin: 0.7 mg/dL (ref 0.3–1.2)

## 2010-02-13 ENCOUNTER — Ambulatory Visit: Payer: Self-pay | Admitting: Family Medicine

## 2010-03-16 ENCOUNTER — Ambulatory Visit: Payer: Self-pay | Admitting: Family Medicine

## 2010-04-23 ENCOUNTER — Ambulatory Visit: Payer: Self-pay | Admitting: Internal Medicine

## 2010-04-24 ENCOUNTER — Ambulatory Visit (HOSPITAL_COMMUNITY)
Admission: RE | Admit: 2010-04-24 | Discharge: 2010-04-24 | Payer: Self-pay | Source: Home / Self Care | Attending: Internal Medicine | Admitting: Internal Medicine

## 2010-04-24 LAB — CMP (CANCER CENTER ONLY)
CO2: 30 mEq/L (ref 18–33)
Calcium: 8.7 mg/dL (ref 8.0–10.3)
Chloride: 100 mEq/L (ref 98–108)
Creat: 0.8 mg/dl (ref 0.6–1.2)
Glucose, Bld: 103 mg/dL (ref 73–118)
Sodium: 142 mEq/L (ref 128–145)
Total Bilirubin: 0.5 mg/dl (ref 0.20–1.60)
Total Protein: 7.2 g/dL (ref 6.4–8.1)

## 2010-04-24 LAB — CBC WITH DIFFERENTIAL/PLATELET
Eosinophils Absolute: 0.2 10*3/uL (ref 0.0–0.5)
HCT: 37.4 % (ref 34.8–46.6)
LYMPH%: 35.8 % (ref 14.0–49.7)
MONO#: 0.6 10*3/uL (ref 0.1–0.9)
NEUT#: 4.1 10*3/uL (ref 1.5–6.5)
NEUT%: 53.6 % (ref 38.4–76.8)
Platelets: 186 10*3/uL (ref 145–400)
WBC: 7.7 10*3/uL (ref 3.9–10.3)
lymph#: 2.7 10*3/uL (ref 0.9–3.3)

## 2010-04-28 ENCOUNTER — Encounter: Payer: Self-pay | Admitting: Internal Medicine

## 2010-05-05 ENCOUNTER — Ambulatory Visit
Admission: RE | Admit: 2010-05-05 | Discharge: 2010-05-05 | Payer: Self-pay | Source: Home / Self Care | Attending: Family Medicine | Admitting: Family Medicine

## 2010-05-06 ENCOUNTER — Telehealth (INDEPENDENT_AMBULATORY_CARE_PROVIDER_SITE_OTHER): Payer: Self-pay | Admitting: *Deleted

## 2010-05-06 LAB — CONVERTED CEMR LAB
Albumin: 3.8 g/dL (ref 3.5–5.2)
CO2: 31 meq/L (ref 19–32)
Calcium: 9.3 mg/dL (ref 8.4–10.5)
Creatinine, Ser: 0.9 mg/dL (ref 0.4–1.2)
Glucose, Bld: 110 mg/dL — ABNORMAL HIGH (ref 70–99)
Total Protein: 7.5 g/dL (ref 6.0–8.3)

## 2010-05-18 ENCOUNTER — Other Ambulatory Visit: Payer: Self-pay | Admitting: Family Medicine

## 2010-05-18 ENCOUNTER — Encounter: Payer: Self-pay | Admitting: Family Medicine

## 2010-05-18 ENCOUNTER — Ambulatory Visit
Admission: RE | Admit: 2010-05-18 | Discharge: 2010-05-18 | Payer: Self-pay | Source: Home / Self Care | Attending: Family Medicine | Admitting: Family Medicine

## 2010-05-19 LAB — HEPATIC FUNCTION PANEL
ALT: 68 U/L — ABNORMAL HIGH (ref 0–35)
AST: 76 U/L — ABNORMAL HIGH (ref 0–37)
Albumin: 3.9 g/dL (ref 3.5–5.2)
Alkaline Phosphatase: 74 U/L (ref 39–117)
Bilirubin, Direct: 0.1 mg/dL (ref 0.0–0.3)
Total Bilirubin: 0.6 mg/dL (ref 0.3–1.2)
Total Protein: 7.5 g/dL (ref 6.0–8.3)

## 2010-05-19 LAB — BASIC METABOLIC PANEL
BUN: 14 mg/dL (ref 6–23)
CO2: 32 mEq/L (ref 19–32)
Calcium: 9.1 mg/dL (ref 8.4–10.5)
Chloride: 104 mEq/L (ref 96–112)
Creatinine, Ser: 0.9 mg/dL (ref 0.4–1.2)
GFR: 68.63 mL/min (ref >60.00)
Glucose, Bld: 115 mg/dL — ABNORMAL HIGH (ref 70–99)
Potassium: 4.4 mEq/L (ref 3.5–5.1)
Sodium: 143 mEq/L (ref 135–145)

## 2010-05-19 LAB — GAMMA GT: GGT: 35 U/L (ref 7–51)

## 2010-05-19 LAB — HEMOGLOBIN A1C: Hgb A1c MFr Bld: 5.9 % (ref 4.6–6.5)

## 2010-05-20 LAB — CONVERTED CEMR LAB
Hep A IgM: NEGATIVE
Hep B C IgM: NEGATIVE
Hepatitis B Surface Ag: NEGATIVE

## 2010-05-22 ENCOUNTER — Other Ambulatory Visit: Payer: Self-pay | Admitting: Internal Medicine

## 2010-05-22 DIAGNOSIS — C3490 Malignant neoplasm of unspecified part of unspecified bronchus or lung: Secondary | ICD-10-CM

## 2010-05-24 ENCOUNTER — Encounter: Payer: Self-pay | Admitting: Family Medicine

## 2010-05-27 ENCOUNTER — Encounter
Admission: RE | Admit: 2010-05-27 | Discharge: 2010-05-27 | Payer: Self-pay | Source: Home / Self Care | Attending: Family Medicine | Admitting: Family Medicine

## 2010-05-31 LAB — CONVERTED CEMR LAB
ALT: 64 units/L — ABNORMAL HIGH (ref 0–35)
AST: 68 units/L — ABNORMAL HIGH (ref 0–37)
Bilirubin, Direct: 0.1 mg/dL (ref 0.0–0.3)
HDL: 39.3 mg/dL (ref >39.0)
Hgb A1c MFr Bld: 5.7 % (ref 4.6–6.0)
Pap Smear: NORMAL
Total Bilirubin: 0.7 mg/dL (ref 0.3–1.2)

## 2010-06-04 NOTE — Assessment & Plan Note (Signed)
Summary: cough/cbs   Vital Signs:  Patient profile:   60 year old female Weight:      281.4 pounds O2 Sat:      93 % on Room air Temp:     98.0 degrees F oral Pulse rate:   96 / minute Pulse rhythm:   regular BP sitting:   128 / 74  (right arm) Cuff size:   large  Vitals Entered By: Almeta Monas CMA Duncan Dull) (March 16, 2010 10:46 AM)  O2 Flow:  Room air CC: c/o cough, sinus pressure, post nasal drainage and green sputum, URI symptoms   History of Present Illness:       This is a 60 year old woman who presents with URI symptoms.  The symptoms began 5 days ago.  Pt here c/o sinus pressure,  no fever.   No otc meds.  + b/l sinus pressure.  The patient complains of nasal congestion, purulent nasal discharge, productive cough, and sick contacts, but denies clear nasal discharge, sore throat, dry cough, and earache.  Associated symptoms include wheezing.  The patient denies fever, low-grade fever (<100.5 degrees), fever of 100.5-103 degrees, fever of 103.1-104 degrees, fever to >104 degrees, stiff neck, dyspnea, rash, vomiting, diarrhea, use of an antipyretic, and response to antipyretic.  The patient also reports itchy throat and headache.  The patient denies itchy watery eyes, sneezing, seasonal symptoms, response to antihistamine, muscle aches, and severe fatigue.  The patient denies the following risk factors for Strep sinusitis: unilateral facial pain, unilateral nasal discharge, poor response to decongestant, double sickening, tooth pain, Strep exposure, tender adenopathy, and absence of cough.    Current Medications (verified): 1)  Mobic 7.5 Mg Tabs (Meloxicam) .Marland Kitchen.. 1 Tablet By Mouth Twice A Day 2)  Multivitamins  Tabs (Multiple Vitamin) .... 2 By Mouth Once Daily 3)  Vitamin D3 2000 Unit Caps (Cholecalciferol) .... Take 2 Tab Once Daily 4)  Diovan Hct 160-12.5 Mg  Tabs (Valsartan-Hydrochlorothiazide) .... One By Mouth Daily  Allergies (verified): 1)  ! Penicillin  Past  History:  Past Medical History: Last updated: 09/20/2008 Hyperlipidemia Hypertension    - Try off ace Sep 20, 2008 (upper airway cough) Low back pain vita d defi Right lung nodule............................................................................Marland KitchenWert    - CT 04/15/08  6mm    - CT   09/18/08  7x12 mm > referred to [facility] Sep 20, 2008   Past Surgical History: Last updated: 11/13/2007  Tubal ligation Cholecystectomy  Family History: Last updated: 05/16/2008 Family History Hypertension Family History of Stroke F 1st degree relative at 68 y-- deceased Family History Lung cancer- Mother (smoker) F-triple bypass---at age 31 Family History of Skin cancer-- brother  Social History: Last updated: 05/16/2008 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 Family History Hypertension Family History of Stroke F 1st degree relative at 66 y-- deceased Family History Lung cancer- Mother (smoker) F-triple bypass---at age 22 Family History of Skin cancer-- brother  Social History: Reviewed history from 05/16/2008 and no changes required. 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  Otoscopic examination reveals clear canals, tympanic membranes  are intact bilaterally without bulging, retraction, inflammation or discharge. Hearing is grossly normal bilaterally. Nose:  L maxillary sinus tenderness and R maxillary sinus tenderness.   Mouth:  Oral mucosa and oropharynx without lesions or exudates.  Teeth in good repair. Neck:  No deformities, masses, or tenderness noted. Lungs:  few scattered rhonci b/l Heart:  normal rate and no murmur.   Psych:  Cognition and judgment appear intact. Alert and cooperative with normal attention span and concentration. No apparent delusions, illusions, hallucinations   Impression & Recommendations:  Problem # 1:  BRONCHITIS- ACUTE (ICD-466.0)  Her updated medication list for this problem includes:    Zithromax Z-pak 250 Mg Tabs (Azithromycin) .Marland Kitchen... As directed  Take antibiotics and other medications as directed. Encouraged to push clear liquids, get enough rest, and take acetaminophen as needed. To be seen in 5-7 days if no improvement, sooner if worse.  Complete Medication List: 1)  Mobic 7.5 Mg Tabs (Meloxicam) .Marland Kitchen.. 1 tablet by mouth twice a day 2)  Multivitamins Tabs (Multiple vitamin) .... 2 by mouth once daily 3)  Vitamin D3 2000 Unit Caps (Cholecalciferol) .... Take 2 tab once daily 4)  Diovan Hct 160-12.5 Mg Tabs (Valsartan-hydrochlorothiazide) .... One by mouth daily 5)  Zithromax Z-pak 250 Mg Tabs (Azithromycin) .... As directed 6)  Omnaris 50 Mcg/act Susp (Ciclesonide) .... 2 sprays each nostril once daily  Patient Instructions: 1)  Acute Bronchitis symptoms for less then 10 days are not  helped by antibiotics. Take over the counter cough medications. Call if no improvement in 5-7 days, sooner if increasing cough, fever, or new symptoms ( shortness of breath, chest pain) .  Prescriptions: MOBIC 7.5 MG TABS (MELOXICAM) 1 tablet by mouth twice a day  #60 x 3   Entered and Authorized by:   Loreen Freud DO   Signed by:   Loreen Freud DO on 03/16/2010   Method used:   Electronically to         [facility] [phone]* (retail)       [address]       [place], [place]  [address]       Ph: [phone]       Fax: [phone]   RxID:   [id number] Christena Deem Z-PAK 250 MG TABS (AZITHROMYCIN) as directed  #1 x 0   Entered and Authorized by:   Loreen Freud DO   Signed by:   Loreen Freud DO on 03/16/2010   Method used:   Electronically to        [facility] [phone]* (retail)       [address]       [place], [place]  [address]       Ph: [phone]       Fax: [phone]   RxID:   [id number]    Orders Added: 1)  Est. Patient Level III [19622]

## 2010-06-04 NOTE — Letter (Signed)
Summary: Work Dietitian at [employer]  [address] [place], [place] [address]   Phone: [phone]  Fax: [phone]    Today's Date: July 29, 2009  Name of Patient: Elizabeth [name]  The above named patient had a medical visit today at:  1045 am.   Please take this into consideration when reviewing the time away from work/school.    Special Instructions:  [  ] None  [  ] To be off the remainder of today, returning to the normal work / school schedule tomorrow.  [  ] To be off until the next scheduled appointment on ______________________.  Arly.Keller  ] Other _________The above patient needs to be able to sit down as needed secondary to Knee pain.  _______________________________________________________ ________________________________________________________________________   Sincerely yours,   Loreen Freud DO

## 2010-06-04 NOTE — Letter (Signed)
Summary: Out of Work  [employer] at [employer]  [address] [place], [place] [address]   Phone: [phone]  Fax: [phone]    March 16, 2010   Employee:  Elizabeth [name]    To Whom It May Concern:   For Medical reasons, please excuse the above named employee from work for the following dates:  Start:   March 16, 2010  End:   March 17, 2010   If you need additional information, please feel free to contact our office.         Sincerely,    Loreen Freud DO

## 2010-06-04 NOTE — Letter (Signed)
[facility]   Imported By: Lanelle Bal 11/11/2009 10:54:19  _____________________________________________________________________  External Attachment:    Type:   Image     Comment:   External Document

## 2010-06-04 NOTE — Progress Notes (Signed)
Summary: discuss labs  Phone Note Outgoing Call   Call placed by: Doristine Devoid,  July 30, 2009 10:21 AM Call placed to: Patient Summary of Call: LFT slightly elevated----  any increased tylenol , alcohol or otc meds?----  only [employer]  --recheck 3 months  790. 4  hep  low normal-----con't vita D3 2000u daily recheck 3 months  Follow-up for Phone Call        left message on machine ..........Marland KitchenDoristine Devoid  July 30, 2009 10:21 AM   spoke w/ patient says she had been having some leg pain over the last week and has been taking up to 4 tablets daily informed to recheck in 3 months....Marland KitchenMarland KitchenDoristine Devoid  July 30, 2009 2:24 PM

## 2010-06-04 NOTE — Progress Notes (Signed)
Summary: Results   Phone Note Outgoing Call   Call placed by: Almeta Monas CMA Duncan Dull),  May 06, 2010 10:02 AM Details for Reason: LFT elevated---any alcohol or tylenol?   recheck 2 weeks---790.4   790.6  hgba1c,, bmp, hep, GGT, acute hep   Summary of Call: Left message to call back..... Almeta Monas CMA (AAMA)  May 06, 2010 10:03 AM  Pt aware appt scheduled...................Marland KitchenFelecia Deloach CMA  May 06, 2010 2:28 PM

## 2010-06-04 NOTE — Assessment & Plan Note (Signed)
Summary: RT HAND INDEX FINGER HAS BUMP, DISCUSS LAB///SPH   Vital Signs:  Patient profile:   60 year old female Height:      65 inches (165.10 cm) Weight:      283.38 pounds (128.81 kg) BMI:     47.33 Temp:     98.1 degrees F (36.72 degrees C) oral BP sitting:   136 / 72  (right arm) Cuff size:   large  Vitals Entered By: Lucious Groves CMA (February 13, 2010 4:09 PM) CC: C/O right hand index finger has a bump on it/discuss labs./kb Is Patient Diabetic? No Pain Assessment Patient in pain? no      Comments Patient notes that the bump is soft to the touch and only hurts if she hits it on something. Patient denies pain, injury, or it affecting her motion./kb   History of Present Illness: Pt here to go over labs and c/o knot on Right index finger.    Current Medications (verified): 1)  Mobic 7.5 Mg Tabs (Meloxicam) .Marland Kitchen.. 1 Tablet By Mouth Twice A Day 2)  Multivitamins  Tabs (Multiple Vitamin) .... 2 By Mouth Once Daily 3)  Vitamin D3 2000 Unit Caps (Cholecalciferol) .... Take 2 Tab Once Daily 4)  Diovan Hct 160-12.5 Mg  Tabs (Valsartan-Hydrochlorothiazide) .... One By Mouth Daily  Allergies (verified): 1)  ! Penicillin  Past History:  Past medical, surgical, family and social histories (including risk factors) reviewed for relevance to current acute and chronic problems.  Past Medical History: Reviewed history from 09/20/2008 and no changes required. Hyperlipidemia Hypertension    - Try off ace Sep 20, 2008 (upper airway cough) Low back pain vita d defi Right lung nodule............................................................................Marland KitchenWert    - CT 04/15/08  6mm    - CT   09/18/08  7x12 mm > referred to [facility] Sep 20, 2008   Past Surgical History: Reviewed history from 11/13/2007 and no changes required.  Tubal ligation Cholecystectomy  Family History: Reviewed history from 05/16/2008 and no changes required. Family History Hypertension Family History of  Stroke F 1st degree relative at 49 y-- deceased Family History Lung cancer- Mother (smoker) F-triple bypass---at age 55 Family History of Skin cancer-- brother  Social History: Reviewed history from 05/16/2008 and no changes required. 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 ganglion cyst--soft round Psych:  Cognition and judgment appear intact. Alert and cooperative with normal attention span and concentration. No apparent delusions, illusions, hallucinations   Impression & Recommendations:  Problem # 1:  LIVER FUNCTION TESTS, ABNORMAL, HX OF (ICD-V12.2) Assessment Improved much better con't to monitor  Problem # 2:  ? of GANGLION CYST (ICD-727.43) Assessment: Improved pt wants to hold off any further evaluation   Complete Medication List: 1)  Mobic 7.5 Mg Tabs (Meloxicam) .Marland Kitchen.. 1 tablet by mouth twice a day 2)  Multivitamins Tabs (Multiple vitamin) .... 2 by mouth once daily 3)  Vitamin D3 2000 Unit Caps (Cholecalciferol) .... Take 2 tab once daily 4)  Diovan Hct 160-12.5 Mg Tabs (Valsartan-hydrochlorothiazide) .... One by mouth daily  Patient Instructions: 1)  rto 3 months for bmp, hep  790.4  401.9    Orders Added: 1)  Est. Patient Level  III [id number]

## 2010-06-04 NOTE — Procedures (Signed)
Summary: [facility]   Imported By: Lanelle Bal 11/06/2009 11:07:36  _____________________________________________________________________  External Attachment:    Type:   Image     Comment:   External Document

## 2010-06-04 NOTE — Assessment & Plan Note (Signed)
Summary: fu on meds/kdc   Vital Signs:  Patient profile:   60 year old female Height:      65 inches Weight:      276 pounds BMI:     46.09 Pulse rate:   78 / minute Pulse rhythm:   regular BP sitting:   130 / 76  (left arm) Cuff size:   large  Vitals Entered By: Army Fossa CMA (July 29, 2009 10:42 AM) CC: Pt here for follow up on meds, having pain in her left knee.    History of Present Illness:  Hypertension follow-up      This is a 60 year old woman who presents for Hypertension follow-up.  Pt states she is not exercising because of pain knee Left.  No known injury.  The patient denies lightheadedness, urinary frequency, headaches, edema, impotence, rash, and fatigue.  The patient denies the following associated symptoms: chest pain, chest pressure, exercise intolerance, dyspnea, palpitations, syncope, leg edema, and pedal edema.  Compliance with medications (by patient report) has been near 100%.  The patient reports that dietary compliance has been good.  The patient reports no exercise.  Adjunctive measures currently used by the patient include salt restriction.    Pt c/o L knee pain.  no known injury.     Current Medications (verified): 1)  Mobic 7.5 Mg Tabs (Meloxicam) .Marland Kitchen.. 1 Tablet By Mouth Twice A Day 2)  Multivitamins  Tabs (Multiple Vitamin) .... 2 By Mouth Once Daily 3)  Vitamin D3 2000 Unit Caps (Cholecalciferol) .... Take 1 Tab Once Daily 4)  Diovan Hct 160-12.5 Mg  Tabs (Valsartan-Hydrochlorothiazide) .... One By Mouth Daily  Allergies: 1)  ! Penicillin  Past History:  Past medical, surgical, family and social histories (including risk factors) reviewed for relevance to current acute and chronic problems.  Past Medical History: Reviewed history from 09/20/2008 and no changes required. Hyperlipidemia Hypertension    - Try off ace Sep 20, 2008 (upper airway cough) Low back pain vita d defi Right lung  nodule............................................................................Marland KitchenWert    - CT 04/15/08  6mm    - CT   09/18/08  7x12 mm > referred to [facility] Sep 20, 2008   Past Surgical History: Reviewed history from 11/13/2007 and no changes required.  Tubal ligation Cholecystectomy  Family History: Reviewed history from 05/16/2008 and no changes required. Family History Hypertension Family History of Stroke F 1st degree relative at 81 y-- deceased Family History Lung cancer- Mother (smoker) F-triple bypass---at age 15 Family History of Skin cancer-- brother  Social History: Reviewed history from 05/16/2008 and no changes required. 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 Lungs are clear to auscultation, no crackles or wheezes. Heart:  normal rate and no murmur.   Msk:  normal ROM, no joint tenderness, no joint swelling, no joint warmth, no redness over joints, and no joint deformities.   Skin:  Intact without suspicious lesions or rashes Cervical Nodes:  No lymphadenopathy noted Psych:  Cognition and judgment appear intact. Alert and cooperative with normal attention span and concentration. No apparent delusions, illusions, hallucinations   Impression & Recommendations:  Problem # 1:  UNSPECIFIED VITAMIN D DEFICIENCY (ICD-268.9)  Orders: Venipuncture (62694) TLB-B12 + Folate Pnl (82746_82607-B12/FOL) TLB-Lipid Panel (80061-LIPID) TLB-BMP (Basic Metabolic Panel-BMET) (80048-METABOL) TLB-CBC Platelet - w/Differential (85025-CBCD) TLB-Hepatic/Liver Function Pnl (80076-HEPATIC) TLB-TSH (Thyroid Stimulating  Hormone) (84443-TSH) T-Vitamin D (25-Hydroxy) (85462-70350)  Problem # 2:  HYPERTENSION (ICD-401.9)  Her updated medication list for this problem includes:    Diovan Hct 160-12.5 Mg Tabs (Valsartan-hydrochlorothiazide) ..... One by mouth daily  Orders: Venipuncture (09381) TLB-B12 + Folate Pnl (82993_71696-V89/FYB) TLB-Lipid Panel (80061-LIPID) TLB-BMP (Basic Metabolic Panel-BMET) (80048-METABOL) TLB-CBC Platelet - w/Differential (85025-CBCD) TLB-Hepatic/Liver Function Pnl (80076-HEPATIC) TLB-TSH (Thyroid Stimulating Hormone) (84443-TSH) T-Vitamin D (25-Hydroxy) (01751-02585)  BP today: 130/76 Prior BP: 132/82 (03/17/2009)  Labs Reviewed: K+: 4.4 (07/11/2008) Creat: : 1.0 (07/11/2008)   Chol: 171 (06/20/2008)   HDL: 34.5 (06/20/2008)   LDL: 107 (06/20/2008)   TG: 149 (06/20/2008)  Problem # 3:  HYPERLIPIDEMIA (ICD-272.4)  Orders: Venipuncture (27782) TLB-B12 + Folate Pnl (42353_61443-X54/MGQ) TLB-Lipid Panel (80061-LIPID) TLB-BMP (Basic Metabolic Panel-BMET) (80048-METABOL) TLB-CBC Platelet - w/Differential (85025-CBCD) TLB-Hepatic/Liver Function Pnl (80076-HEPATIC) TLB-TSH (Thyroid Stimulating Hormone) (84443-TSH) T-Vitamin D (25-Hydroxy) (67619-50932)  Labs Reviewed: SGOT: 34 (11/25/2008)   SGPT: 33 (11/25/2008)   HDL:34.5 (06/20/2008), 39.3 (02/26/2008)  LDL:107 (06/20/2008), 95 (67/04/4579)  Chol:171 (06/20/2008), 162 (02/26/2008)  Trig:149 (06/20/2008), 140 (02/26/2008)  Problem # 4:  KNEE PAIN, LEFT (ICD-719.46)  Her updated medication list for this problem includes:    Mobic 7.5 Mg Tabs (Meloxicam) .Marland Kitchen... 1 tablet by mouth twice a day  Discussed strengthening exercises, use of ice or heat, and medications.   Complete Medication List: 1)  Mobic 7.5 Mg Tabs (Meloxicam) .Marland Kitchen.. 1 tablet by mouth twice a day 2)  Multivitamins Tabs (Multiple vitamin) .... 2 by mouth once daily 3)  Vitamin D3 2000 Unit Caps (Cholecalciferol) .... Take 1 tab once daily 4)  Diovan Hct  160-12.5 Mg Tabs (Valsartan-hydrochlorothiazide) .... One by mouth daily Prescriptions: DIOVAN HCT 160-12.5 MG  TABS (VALSARTAN-HYDROCHLOROTHIAZIDE) One by mouth daily  #30 x 5   Entered and Authorized by:   Loreen Freud DO   Signed by:   Loreen Freud DO on 07/29/2009   Method used:   Electronically to        [facility] [phone]* (retail)       [address]       [place], [place]  [address]       Ph: [phone]       Fax: [phone]   RxID:   [id number] MOBIC 7.5 MG TABS (MELOXICAM) 1 tablet by mouth twice a day  #60 x 3   Entered and Authorized by:   Loreen Freud DO   Signed by:   Loreen Freud DO on 07/29/2009   Method used:   Electronically to        [facility] [phone]* (retail)       [address]       [place], [place]  [address]       Ph: [phone]       Fax: [phone]   RxID:   [id number]

## 2010-06-04 NOTE — Letter (Signed)
[facility]   Imported By: Lanelle Bal 06/09/2009 08:53:13  _____________________________________________________________________  External Attachment:    Type:   Image     Comment:   External Document

## 2010-06-04 NOTE — Letter (Signed)
Summary: [facility]  [facility]   Imported By: Lester Appling 05/06/2010 09:00:55  _____________________________________________________________________  External Attachment:    Type:   Image     Comment:   External Document

## 2010-06-04 NOTE — Progress Notes (Signed)
Summary: ADDL? ORDERS FOR 10/7 LABS??  Phone Note Call from Patient   Caller: Patient Summary of Call: PATIENT HAS LABS ON 10/7 FOR HEP 790.4 AND RECHECK VIT D--WHAT CODE FOR VIT D RECHECK;   DOES DR LOWNE NEED TO ADD ANY MORE LABS??     PT HAS APPT WITH DR Laury Axon ON 10/14 Initial call taken by: Jerolyn Shin,  January 28, 2010 2:04 PM  Follow-up for Phone Call        please advise Follow-up by: Almeta Monas CMA Duncan Dull),  January 29, 2010 4:41 PM  Additional Follow-up for Phone Call Additional follow up Details #1::        vita d deficiency 268.9 add bmp 401.9 Additional Follow-up by: Loreen Freud DO,  January 29, 2010 4:42 PM    Additional Follow-up for Phone Call Additional follow up Details #2::    ADDED BMP 401.9 AND ADDED CODE 268.9 FOR VIT D DEFICIENCY Follow-up by: Jerolyn Shin,  January 30, 2010 3:22 PM

## 2010-08-10 LAB — COMPREHENSIVE METABOLIC PANEL
ALT: 42 U/L — ABNORMAL HIGH (ref 0–35)
Alkaline Phosphatase: 56 U/L (ref 39–117)
BUN: 6 mg/dL (ref 6–23)
CO2: 28 mEq/L (ref 19–32)
Calcium: 9.5 mg/dL (ref 8.4–10.5)
Chloride: 95 mEq/L — ABNORMAL LOW (ref 96–112)
Chloride: 99 mEq/L (ref 96–112)
Creatinine, Ser: 0.9 mg/dL (ref 0.4–1.2)
Creatinine, Ser: 0.96 mg/dL (ref 0.4–1.2)
GFR calc non Af Amer: >60 mL/min (ref >60)
Glucose, Bld: 122 mg/dL — ABNORMAL HIGH (ref 70–99)
Glucose, Bld: 81 mg/dL (ref 70–99)
Potassium: 4 mEq/L (ref 3.5–5.1)
Total Bilirubin: 0.5 mg/dL (ref 0.3–1.2)
Total Bilirubin: 1 mg/dL (ref 0.3–1.2)
Total Protein: 6.6 g/dL (ref 6.0–8.3)

## 2010-08-10 LAB — CULTURE, BLOOD (ROUTINE X 2): Culture: NO GROWTH

## 2010-08-10 LAB — CBC
HCT: 32.9 % — ABNORMAL LOW (ref 36.0–46.0)
HCT: 34.8 % — ABNORMAL LOW (ref 36.0–46.0)
Hemoglobin: 11.5 g/dL — ABNORMAL LOW (ref 12.0–15.0)
Hemoglobin: 13 g/dL (ref 12.0–15.0)
MCHC: 33.9 g/dL (ref 30.0–36.0)
MCHC: 34.5 g/dL (ref 30.0–36.0)
MCV: 95.4 fL (ref 78.0–100.0)
MCV: 96.5 fL (ref 78.0–100.0)
MCV: 97.1 fL (ref 78.0–100.0)
MCV: 97.2 fL (ref 78.0–100.0)
MCV: 97.5 fL (ref 78.0–100.0)
Platelets: 128 10*3/uL — ABNORMAL LOW (ref 150–400)
Platelets: 132 10*3/uL — ABNORMAL LOW (ref 150–400)
Platelets: 148 10*3/uL — ABNORMAL LOW (ref 150–400)
RBC: 3.32 MIL/uL — ABNORMAL LOW (ref 3.87–5.11)
RBC: 3.41 MIL/uL — ABNORMAL LOW (ref 3.87–5.11)
RBC: 3.96 MIL/uL (ref 3.87–5.11)
RDW: 12.9 % (ref 11.5–15.5)
RDW: 13.1 % (ref 11.5–15.5)
WBC: 12.8 10*3/uL — ABNORMAL HIGH (ref 4.0–10.5)
WBC: 7.8 10*3/uL (ref 4.0–10.5)
WBC: 9.7 10*3/uL (ref 4.0–10.5)

## 2010-08-10 LAB — TYPE AND SCREEN
ABO/RH(D): O POS
Antibody Screen: NEGATIVE

## 2010-08-10 LAB — GRAM STAIN

## 2010-08-10 LAB — URINALYSIS, ROUTINE W REFLEX MICROSCOPIC
Bilirubin Urine: NEGATIVE
Glucose, UA: NEGATIVE mg/dL
Ketones, ur: NEGATIVE mg/dL
Protein, ur: NEGATIVE mg/dL

## 2010-08-10 LAB — BASIC METABOLIC PANEL
BUN: 8 mg/dL (ref 6–23)
BUN: 8 mg/dL (ref 6–23)
CO2: 34 mEq/L — ABNORMAL HIGH (ref 19–32)
Chloride: 95 mEq/L — ABNORMAL LOW (ref 96–112)
Chloride: 97 mEq/L (ref 96–112)
Creatinine, Ser: 0.99 mg/dL (ref 0.4–1.2)
GFR calc Af Amer: >60 mL/min (ref >60)
GFR calc Af Amer: >60 mL/min (ref >60)
GFR calc non Af Amer: >60 mL/min (ref >60)
Glucose, Bld: 113 mg/dL — ABNORMAL HIGH (ref 70–99)
Potassium: 3.7 mEq/L (ref 3.5–5.1)
Sodium: 132 mEq/L — ABNORMAL LOW (ref 135–145)

## 2010-08-10 LAB — BLOOD GAS, ARTERIAL
Bicarbonate: 28.1 mEq/L — ABNORMAL HIGH (ref 20.0–24.0)
Drawn by: 313941
FIO2: 0.21 %
O2 Saturation: 95.7 %
Patient temperature: 98.6
pH, Arterial: 7.42 — ABNORMAL HIGH (ref 7.350–7.400)

## 2010-08-10 LAB — POCT I-STAT 3, ART BLOOD GAS (G3+)
pCO2 arterial: 57 mmHg — ABNORMAL HIGH (ref 35.0–45.0)
pH, Arterial: 7.305 — ABNORMAL LOW (ref 7.350–7.400)
pO2, Arterial: 85 mmHg (ref 80.0–100.0)

## 2010-08-10 LAB — PROTIME-INR: Prothrombin Time: 13.5 seconds (ref 11.6–15.2)

## 2010-08-10 LAB — EXPECTORATED SPUTUM ASSESSMENT W GRAM STAIN, RFLX TO RESP C

## 2010-08-10 LAB — URINE CULTURE

## 2010-08-10 LAB — APTT: aPTT: 29 seconds (ref 24–37)

## 2010-08-10 LAB — ABO/RH: ABO/RH(D): O POS

## 2010-08-11 LAB — GLUCOSE, CAPILLARY: Glucose-Capillary: 105 mg/dL — ABNORMAL HIGH (ref 70–99)

## 2010-08-31 ENCOUNTER — Other Ambulatory Visit: Payer: Self-pay

## 2010-08-31 MED ORDER — VALSARTAN-HYDROCHLOROTHIAZIDE 160-12.5 MG PO TABS: 1.0000 | ORAL_TABLET | Freq: Every day | ORAL | Status: DC

## 2010-09-15 NOTE — Op Note (Signed)
Elizabeth [name], Elizabeth [name]                 ACCOUNT NO.:  [id number]   MEDICAL RECORD NO.:  [id number]          PATIENT TYPE:  INP   LOCATION:  2315                         FACILITY:  [facility]   PHYSICIAN:  [name], M.D. DATE OF BIRTH:  [birth year]   DATE OF PROCEDURE:  10/18/2008  DATE OF DISCHARGE:                               OPERATIVE REPORT   PREOPERATIVE DIAGNOSIS:  Right lower lobe mass.   POSTOPERATIVE DIAGNOSIS:  Right lower lobe nonsmall-cell lung cancer.   OPERATION PERFORMED:  Right video-assisted thoracoscopy, right  thoracotomy, and right lower lobe superior segmentectomy.   SURGEON:  [name], M.D.   ANESTHESIA:  General anesthesia.   PROCEDURE IN DETAIL:  After percutaneous insertion of all monitor lines,  the patient underwent general anesthesia, was prepped and draped in the  usual sterile manner.  A dual-lumen tube was inserted.  The patient was  turned to the right lateral thoracotomy position.  The patient was  morbidly obese.  A trocar site was made in the eighth intercostal space  at the anterior axillary line, and a 0-degree scope was inserted.  After  this had been done, we decided to go with thoracotomy, and a  posterolateral thoracotomy was made, and this was divided with  electrocautery, 2300 electrocautery of the subcutaneous tissues and the  latissimus dorsi muscle were divided.  The serratus was reflected  anterior.  The fifth intercostal space was entered.  Two Tuffiers were  placed at right angles.  The lesion was palpated in the superior segment  of the right lower lobe.  Dissection was started in the fissure, and the  patient had pretty much complete fissure.  We dissected out several 11R  and 10R nodes and then stapled and divided superior segmental artery,  exposed the superior segmental bronchus, and this was dissected out,  stapled, and divided with the 3.2 mm stapler.  Next, the inferior  pulmonary ligament was taken down with  electrocautery.  Several more 10L  nodes were removed.  We then identified the superior segmental veins,  and this was stapled and divided with the 3.2 mm Autosuture stapler.  The segmentectomy was completed with the Autosuture 4.5 mm stapler with  several applications.  Diagnosis was nonsmall-cell lung cancer, and  margins were negative.  We then applied ProGEL to the staple line,  brought in 2 chest tubes, one through the trocar sites and one through  another stab strip, sutured in place with 0 silk.  A single On-Q was  placed in the usual fashion.  Chest was closed with 4 pericostals  drilling through the sixth rib and passing around the fifth rib, #1  Vicryl in the muscle layer, 2-0 Vicryl in the subcutaneous tissue, and  #0 Prolene horizontal mattresses in the skin as well as staples.  The  patient was returned to the recovery room in stable condition.      [name], M.D.  Electronically Signed     DPB/MEDQ  D:  10/18/2008  T:  10/18/2008  Job:  [id number]

## 2010-09-15 NOTE — Letter (Signed)
October 15, 2009   Lajuana Matte, MD  [phone]  [place], [place] [address]   Re:  Elizabeth [name], Elizabeth [name]                 DOB:  [birth year]   Dear Arbutus Ped:   I saw the patient today and her CT scan is fine, 1 year after having a  right lower lobe superior segmentectomy.  She is doing well overall, and  I will let you continue her long-term followup.  I appreciate the  opportunity of seeing the patient.   Sincerely,   [name], M.D.  Electronically Signed   DPB/MEDQ  D:  10/15/2009  T:  10/16/2009  Job:  [id number]

## 2010-09-15 NOTE — Assessment & Plan Note (Signed)
OFFICE VISIT   Elizabeth [name], Elizabeth [name] A  DOB:  01-06-1951                                        March 04, 2009  CHART #:  [id number]   The patient came for follow up today.  Her chest x-ray was stable.  Her  blood pressure is 146/70, pulse 78, respirations 18, sats were 96%.  Lungs clear to auscultation and percussion.  Heart regular sinus rhythm.  She is going to get a CT scan in January by Dr. Arbutus Ped, and I will see  her back again at that time.   [name], M.D.  Electronically Signed   DPB/MEDQ  D:  03/04/2009  T:  03/05/2009  Job:  [id number]

## 2010-09-15 NOTE — Assessment & Plan Note (Signed)
OFFICE VISIT   Elizabeth [name], Elizabeth [name]  DOB:  Oct 01, 1950                                        November 13, 2008  CHART #:  [id number]   The patient came today.  She is doing well.  She saw Dr. Arbutus Ped and he  recommended no further treatment.  Her blood pressure was 115/79, pulse  86, respirations 20, and sats were 93%.  I told her, she can start  increasing her activities.  Her chest x-ray was stable.  We will see her  back again in 6 weeks.   [name], M.D.  Electronically Signed   DPB/MEDQ  D:  11/13/2008  T:  11/13/2008  Job:  [id number]

## 2010-09-15 NOTE — Assessment & Plan Note (Signed)
OFFICE VISIT   Elizabeth [name], Elizabeth [name] A  DOB:  [birth year]                                        May 07, 2009  CHART #:  [id number]   The patient returns today and her CT scan done 2 weeks ago was negative  for recurrence of cancer, some mild postthoracotomy pain after right  lower lobe segmentectomy.  Her blood pressure is 120/64, pulse 90,  respiration 18, sats were 99%.  I will see her back again in 4 months  with a chest x-ray.   [name], M.D.  Electronically Signed   DPB/MEDQ  D:  05/07/2009  T:  05/08/2009  Job:  [id number]

## 2010-09-15 NOTE — Assessment & Plan Note (Signed)
OFFICE VISIT   SHERBY, MONCAYO A  DOB:  [birth year]                                        December 24, 2008  CHART #:  [id number]   The patient came today and she is seen.  Her blood pressure is 120/70,  pulse 98, respirations 18, sats were 96%.  Her incisions are well-  healed.  She is having some mild dyspnea and some mild pain.  Her chest  x-ray showed normal changes in the right and she is scheduled for a CT  scan in December.  Overall, she is doing well from my standpoint.  I  will plan to see her back again in 2 months with a chest x-ray.   [name], M.D.  Electronically Signed   DPB/MEDQ  D:  12/24/2008  T:  12/25/2008  Job:  [id number]

## 2010-09-15 NOTE — Discharge Summary (Signed)
NAMEJYLIAN, Elizabeth [name]                 ACCOUNT NO.:  [id number]   MEDICAL RECORD NO.:  [id number]          PATIENT TYPE:  INP   LOCATION:  2023                         FACILITY:  [facility]   PHYSICIAN:  [name], M.D. DATE OF BIRTH:  November 10, 1950   DATE OF ADMISSION:  10/18/2008  DATE OF DISCHARGE:                               DISCHARGE SUMMARY   ADDENDUM   Again, the patient had been started on her Diovan/HCTZ.  Blood pressure  was stable on this.  Her lisinopril was held, and due to blood pressure  being stable it was not restarted during her admission.  Postoperatively, the patient was up ambulating well without difficulty.  She was tolerating diet well.  No nausea or vomiting noted.  Incisions  were clean, dry, and intact and healing well.  Plan will be 2  discontinue half of her staples today and other half in the a.m. prior  to discharge.   On postop day 5, October 23, 2008, the patient was noted to be afebrile.  Vital signs stable.  Her most recent lab work shows white blood cell  count of 9.7, hemoglobin of 10.2, hematocrit 29., platelet count 128.  Sodium of 137, potassium 4.5, chloride of 95, BUN of 8, creatinine of  0.99, glucose of 113.  PA and lateral chest x-ray is stable with no  pneumothorax noted.  Plan will be to obtain PA and lateral chest x-ray  in a.m. for followup and discharge to home in a.m., October 24, 2008,  postop day #6.   FOLLOWUP APPOINTMENTS:  A followup appointment has been arranged with  Dr. Edwyna Shell for October 30, 2008, at 4:15 p.m.  The patient will need to  obtain PA and lateral chest x-ray 45 minutes prior to this appointment.   ACTIVITY:  The patient instructed no driving until released to do so, no  heavy lifting over 10 pounds.  She is told to ambulate 3-4 times per  day, progress as tolerated, and continue her breathing exercises.   INCISIONAL CARE:  The patient is told to shower, washing her incisions  using soap and water.  She is to contact the  office if she develops any  drainage or opening from any of her incision sites.   DIET:  The patient educated on diet to be low fat, low salt.   DISCHARGE MEDICATIONS:  1. Diovan HCT 160/12.5 mg daily.  2. Vitamin D 2000 units daily.  3. Multivitamin daily.  4. Calcium and vitamin D daily.  5. Omega-3 daily.  6. Vitamin D 50,000 units weekly.  7. Folic acid daily.  8. Vitamin C daily.  9. Avelox 400 mg daily x5 days.  10.Percocet 5/325 one to two tabs q.4- 6 hours p.r.n. pain.  11.Leg cramp pills daily.       Sol Blazing, PA      [name], M.D.  Electronically Signed    KMD/MEDQ  D:  10/23/2008  T:  10/23/2008  Job:  [id number]

## 2010-09-15 NOTE — Discharge Summary (Signed)
Elizabeth [name], Elizabeth [name]                 ACCOUNT NO.:  [id number]   MEDICAL RECORD NO.:  [id number]          PATIENT TYPE:  INP   LOCATION:  2023                         FACILITY:  [facility]   PHYSICIAN:  [name], M.D. DATE OF BIRTH:  04/15/1951   DATE OF ADMISSION:  10/18/2008  DATE OF DISCHARGE:                               DISCHARGE SUMMARY   FINAL DIAGNOSIS:  Right lower lobe mass, positive for squamous cell  carcinoma T1a N0 MX.   IN-HOSPITAL DIAGNOSIS:  Postoperative urinary tract infection.   SECONDARY DIAGNOSES:  1. Hypertension.  2. Hyperlipidemia.  3. Morbid obesity.  4. Status post tubal ligation.  5. Status post cholecystectomy.   IN-HOSPITAL OPERATIONS AND PROCEDURES:  Right video-assisted  thoracoscopic surgery with right thoracotomy; superior segmentectomy,  right lower lobe with lymph node biopsy.   HISTORY AND PHYSICAL AND HOSPITAL COURSE:  The patient is a 60 year old  Caucasian female who was found to have a pulmonary nodule on abdominal  CT scan for her abdominal pain.  She has had no cough or excessive  sputum.  There was an 8- to 10- x 12-cm nodule in the right lower lobe  superior segment.  The patient quit smoking in 2006.  A PET scan was  then done, which showed uptake of 2.6.  Pulmonary function tests done  showed an FVC of 2.86 with an FEV-1 of 2.15 and diffusion capacity of  100%.  The patient was seen and evaluated by Dr. Edwyna Shell.  Dr. Edwyna Shell  discussed with the patient undergoing resection of this lesion.  He  discussed the risks and benefits with the patient.  The patient nods her  understanding and agreed to proceed.  Surgery was scheduled for October 18, 2008.  For details of the patient's past medical history and physical  exam please see dictated H and P.   The patient was taken to the operating room on October 18, 2008, where she  underwent right video-assisted thoracoscopic surgery with right  thoracotomy, superior segmentectomy of right lower  lobe with lymph node  biopsy.  The patient tolerated this procedure well was transferred to  the intensive care unit in stable condition.  The patient's pathology  report came back positive for squamous cell carcinoma T1a N0 MX.  Postoperatively, the patient was able to be extubated following surgery.  Post extubation, she was noted to be alert and oriented x4.  She was  noted to be hemodynamically stable.  Chest x-rays were done daily,  postoperatively.  These were stable.  No pneumothorax noted.  The  patient had no air leak noted from chest tubes.  Posterior chest tube  discontinued postop day #2 with remaining chest tube discontinuing on  postop day #3.  Follow up chest x-ray remained stable with left lower  lobe atelectasis.  During this time, the patient was encouraged to use  her incentive spirometer.  Currently, attempting to wean off oxygen to  maintain O2 sats of 88% or greater.  Postoperatively, the patient's  vitals were monitored.  She did develop a fever of 102.9, by postop day  #  3.  Blood cultures were obtained, which were negative.  Urinalysis and  urine culture were obtained, which was positive for Enterobacter.  The  patient was started on antibiotics.  The patient's fever resolved.  Her  white blood cell count was mildly elevated and stabilized after  initiation of antibiotics.  Postoperatively, the patient remained in  normal sinus rhythm.  Blood pressure was stable.  She had been restarted  on her Diovan/hydrochlorothiazide.  The patient's blood pressure was  noted to be stable.    Dictation ended at this point.      Sol Blazing, PA      [name], M.D.  Electronically Signed    KMD/MEDQ  D:  10/23/2008  T:  10/23/2008  Job:  [id number]

## 2010-09-15 NOTE — Letter (Signed)
October 30, 2008   Casimiro Needle B. Sherene Sires, MD, FCCP  [address]  [place], [place] [address]   Re:  [name], [name]                 DOB:  14-Aug-1950   Dear [name],   I saw the patient back in the office today.  We did a right lower lobe  superior segmentectomy and she was a stage IA cancer and referred to Dr.  Arbutus Ped for followup.  We removed her chest sutures today.  Her blood  pressure was 131/78, pulse 64, respirations 18, and saturations were  97%.  She is doing well overall and I will see her back again in 3 weeks  with a chest x-ray.  The chest x-ray today showed normal postoperative  changes.   [name], M.D.  Electronically Signed   DPB/MEDQ  D:  10/30/2008  T:  10/31/2008  Job:  [id number]

## 2010-09-15 NOTE — Letter (Signed)
Sep 26, 2008   Casimiro Needle B. Sherene Sires, MD, FCCP  [address]  [place], [place] [address]   Re:  Elizabeth [name], Elizabeth [name]                 DOB:  Jul 09, 1950   Dear [name],   I appreciate the opportunity of seeing the patient who was found to have  a pulmonary nodule that was found after her abdominal CT scan for her  recurrent abdominal pain.  She has had no cough, fever, chills, or  excessive sputum.  She was found to have an 8-12 mm lesion in the right  lower lobe superior segment.  She quit smoking in  2006.  She smoked  approximately at least a pack a day for 20 years.  PET scan was positive  with an SUV of 2.6.  This lesion has increased in size.  It was  previously 7 x 7 mm and now increased to 12 x 7 mm when this is compared  to the CT scan of approximately 6 months ago.  Her pulmonary function  test showed an FVC of 2.86 with an FEV1 of 2.17, diffusion capacity is  100%.   MEDICATIONS:  1. Mobic 7.5 mg twice a day.  2. Klor-Con daily.  3. Multivitamin.  4. Vitamin C.  5. Vitamin B12.  6. Vitamin D.  7. __________  8. Caltrate.   ALLERGIES:  She is allergic to PENICILLIN.   She has hyperlipidemia, hypertension, lobectomy, and morbid obesity.  She was on lisinopril and was treated for chronic cough.   FAMILY HISTORY:  Noncontributory.  Positive for CVA, stroke, she had  previous surgical history of a tubal ligation and cholecystectomy.   SOCIAL HISTORY:  She works as a Conservation officer, nature, she is  single, one child, quit  smoking in 2006.  Does not drink alcohol.   REVIEW OF SYSTEMS:  Vital Signs:  Her weight is 275 pounds.  She is 5  feet 3 inches.  Cardiac:  No angina or atrial fibrillation.  She gets  shortness of breath on exertion.  Pulmonary:  See history of present  illness.  GI:  No nausea, vomiting, constipation, or diarrhea.  GU:  No  kidney disease, dysuria, or frequent urination.  Vascular:  No  claudication, DVT, or TIAs.  Neurologic:  No dizziness, headaches,  blackouts, or  seizures.  Musculoskeletal:  She has got arthritis.  Psychiatric:  No depression or nervousness.  Eye/ENT:  No changes in  eyesight or hearing.  Hematologic:  No problems with bleeding, clotting  disorders, or anemia.   PHYSICAL EXAMINATION:  GENERAL:  She is a well developed,  obese  Caucasian female in no acute distress.  VITAL SIGNS:  Her blood pressure is 137/80, pulse 80, respirations 18,  and sats were 95%.  HEAD, EYES, EARS, NOSE, AND THROAT:  Unremarkable.  NECK:  Supple without thyromegaly.  There is no supraclavicular or  axillary adenopathy.  CHEST:  Clear to auscultation and percussion.  HEART:  Regular, sinus rhythm, no murmurs.  ABDOMEN:  Obese.  Bowel sounds are normal.  EXTREMITIES:  Pulses are 2+.  There is no clubbing or edema.  NEUROLOGIC:  She is oriented x3.  Sensory and motor intact.  Cranial  nerves are intact.   We feel this is an enlarging lesion that does require removal.  Her main  risk factor is her morbid obesity, which will probably require a larger  incision than usual.  We plan to do this  in approximately 2 weeks at the  [facility].  We will try the best approach, which will  probably  require a thoracotomy given the size of her lesion.  I hope to do a  right lower lobe segmentectomy, but may have to do a right lower  lobectomy.   Sincerely,   [name], M.D.  Electronically Signed   DPB/MEDQ  D:  09/26/2008  T:  09/26/2008  Job:  [id number]

## 2010-09-15 NOTE — H&P (Signed)
Elizabeth [name], Elizabeth [name]                 ACCOUNT NO.:  [id number]   MEDICAL RECORD NO.:  [id number]          PATIENT TYPE:  INP   LOCATION:  NA                           FACILITY:  [facility]   PHYSICIAN:  [name], M.D. DATE OF BIRTH:  Nov 24, 1950   DATE OF ADMISSION:  10/16/2008  DATE OF DISCHARGE:                              HISTORY & PHYSICAL   HISTORY OF PRESENT ILLNESS:  This 60 year old Caucasian female was found  to have pulmonary nodule on abdominal CT scan for recurrent abdominal  pain.  She has had no cough, excessive sputum.  There is an 8-10 x 12 cm  nodule in the right lower lobe superior segment. She quit smoking in  2006. Had smoked at least a pack a day for 20 years. The standard uptake  value on PET was 2.6. The lesion increased from size from 7.7 mm to 12 x  7 mm in the last 4 to 5 months. Her pulmonary function tests shows an  FVC of 2.86 with a FEV-1 of 2.15 and diffusion capacity of 100%.  She  has had no hemoptysis.   MEDICATIONS:  1. Mobic 7.5 mg twice day.  2. Klor-Con daily.  3. Multivitamins.  4. Vitamin C and B12.  5. Caltrate.  6. She was on Lisinopril but was taken off of that because of chronic      cough.   ALLERGIES:  PENICILLIN.   PAST MEDICAL HISTORY:  1. Hyperlipidemia.  2. Hypertension.  3. Morbid obesity.   FAMILY HISTORY:  Positive for CVA and stroke.   PAST SURGICAL HISTORY:  1. Tubal ligation.  2. Cholecystectomy.   SOCIAL HISTORY:  She is single. Quit smoking in 2006. Does not drink  alcohol on a regular basis.   REVIEW OF SYSTEMS:  VITAL SIGNS:  She is 5 feet 3. She is 275 pounds.  CARDIOVASCULAR:  No angina or atrial fibrillation.  She gets shortness  of breath exertion.  PULMONARY:  See history of present illness. GI:  No nausea, vomiting,  constipation, diarrhea. GU:  No kidney disease, dysuria or frequent  urination.  VASCULAR:  No claudication, DVT, TIAs. NEUROLOGICAL:  No dizziness,  headaches, blackouts or seizures.  MUSCULOSKELETAL:  Arthritis.  PSYCHIATRIC:  No depression or nervous. HEENT:  No changes in eyesight  or hearing. HEMATOLOGICAL:  No problems with bleeding, clotting  disorders or anemia.   PHYSICAL EXAMINATION:  GENERAL:  She is a obese Caucasian female in no  acute distress.  VITAL SIGNS:  Her blood pressure is 137/80, pulse 80, respirations 18,  sat's were 95%.  HEENT:  Head is atraumatic.  Eyes: Pupils equal and reactive to light  and accommodation.  Extraocular movements are normal.  Ears: Tympanic  membranes intact.  Nose: There is no septal deviation.  Throat without  lesion.  NECK:  Supple without thyromegaly.  There is no supraclavicular axillary  adenopathy.  CHEST:  Clear to auscultation and percussion.  HEART:  Regular sinus rhythm, no murmurs.  ABDOMEN:  Soft and obese.  Bowel sounds are normal.  EXTREMITIES:  Pulses are 1+. There  is no clubbing or edema.  NEUROLOGICAL:  She is oriented x3.  Sensory and motor intact.  Cranial  nerves intact.   IMPRESSION:  PET positive right lower lobe superior segmental lesion,  rule out cancer.  1. Morbid obesity.  2. Hyperlipidemia.  3. Hypertension.   PLAN:  Right VATS, right lower lobe superior segmentectomy.      [name], M.D.  Electronically Signed     DPB/MEDQ  D:  10/16/2008  T:  10/16/2008  Job:  [id number]

## 2010-09-18 NOTE — Op Note (Signed)
Elizabeth [name], Elizabeth [name]                 ACCOUNT NO.:  [id number]   MEDICAL RECORD NO.:  [id number]          PATIENT TYPE:  AMB   LOCATION:  DAY                          FACILITY:  [facility]   PHYSICIAN:  Leonie Man, M.D.   DATE OF BIRTH:  1950-08-09   DATE OF PROCEDURE:  04/13/2004  DATE OF DISCHARGE:                                 OPERATIVE REPORT   PREOPERATIVE DIAGNOSIS:  Chronic calculous cholecystitis.   POSTOPERATIVE DIAGNOSIS:  Chronic calculous cholecystitis.   PROCEDURE:  Laparoscopic cholecystectomy, with intraoperative cholangiogram.   SURGEON:  Leonie Man, M.D.   ASSISTANT:  Angelia Mould. Derrell Lolling, M.D.   ANESTHESIA:  General.   NOTE:  The patient is a 60 year old woman presenting with upper abdominal  pain located primarily in the right upper quadrant, associated with nausea  and vomiting.  She was seen and evaluated by gallbladder ultrasound which  showed cholelithiasis.  Liver function studies, amylase, and lipase were all  within normal limits.  She had no leukocytosis, fever, or jaundice.  She  comes to the operating room now after the risks and potential benefits of  surgery have been fully discussed, all questions answered, and consent  obtained.   PROCEDURE:  Following the induction of satisfactory general anesthesia, with  the patient positioned supinely, the abdomen was routinely prepped and  draped to be included in the sterile operative field.  Open laparoscopy  created at the umbilicus, with insertion of a Hasson type cannula and  insufflation of the peritoneal cavity to 40 mmHg pressure using carbon  dioxide.  The camera was inserted, and visual exploration of the abdomen  carried out, and the gallbladder was scarred, with multiple adhesions to the  gallbladder wall.  Liver edge was sharp.  Liver surface was smooth.  The  anterior gastric wall, duodenal sweep was normal.  No other small or large  intestine viewed appeared to be abnormal.  Under direct  vision, epigastric  and lateral ports were placed.  The gallbladder was grasped and retracted  cephalad, and the adhesions to the gallbladder were taken down, carrying the  dissection down to the ampulla.  At the ampulla, the cystic artery and  cystic duct were isolated, tracing the cystic duct out to the cystic  duct/gallbladder junction, and I could see the cystic duct/common duct  junction.  The cystic artery was similarly traced up to its entry into the  gallbladder wall.  The cystic duct was clipped proximally and opened.  Cystic ductal angiogram was then carried out by placing a Cook catheter  through the abdominal wall and into the cystic duct, injecting one-half  strength Hypaque into the biliary system under fluoroscopic guidance.  Resulting cholangiogram showed prompt flow of contrast into the duodenum.  The ducts appeared to be of normal caliber.  The upper radicals were also  normal.  I think I did get a gas bubble in there floated within the duct.  This was round and smooth and did not appear to be a stone.  The cystic duct  catheter was then removed, and the cystic duct was then  triply clamped and  transected, the cystic artery then triply clipped and transected.  The  gallbladder was dissected free from the liver bed using electrocautery and  maintaining hemostasis along the course of the dissection.  At the end of  the dissection, the right upper quadrant was thoroughly irrigated with  normal saline.  Additional bleeding points were treated with electrocautery.  The camera was moved to the epigastric port and the gallbladder retrieved  through the umbilical port without difficulty.  Sponge and instrument counts  were then verified.  The lateral trocars were removed under direct vision.  Sponge, instrument, and sharp counts were again verified, and the wound was  closed in layers as follows:  The umbilical wound closed in two layers using  0 Vicryl and 4-0 Monocryl.  The  pneumoperitoneum was released, and the  epigastric and lateral flank wounds closed with 4-0 Monocryl sutures.  All  wounds were reinforced with Steri-Strips.  Sterile dressings applied.  Anesthetic reversed.  Patient removed from the operating room to the  recovery room in stable condition.  She tolerated the procedure well.     Patr   PB/MEDQ  D:  04/13/2004  T:  04/13/2004  Job:  [id number]   cc:   Leonie Man, M.D.  [address]  [address]  [place]  [place] [address]  Fax: [phone]   Leanne Chang, M.D.  [address]  [place]  [place] [address]  Fax: [phone]

## 2010-10-27 ENCOUNTER — Ambulatory Visit (HOSPITAL_COMMUNITY)
Admission: RE | Admit: 2010-10-27 | Discharge: 2010-10-27 | Disposition: A | Discharge status: Home / Self Care | Payer: BC Managed Care – PPO | Source: Ambulatory Visit | Attending: Internal Medicine | Admitting: Internal Medicine

## 2010-10-27 ENCOUNTER — Encounter (HOSPITAL_COMMUNITY): Payer: Self-pay

## 2010-10-27 ENCOUNTER — Encounter (HOSPITAL_BASED_OUTPATIENT_CLINIC_OR_DEPARTMENT_OTHER): Payer: BC Managed Care – PPO | Admitting: Internal Medicine

## 2010-10-27 ENCOUNTER — Other Ambulatory Visit: Payer: Self-pay | Admitting: Internal Medicine

## 2010-10-27 DIAGNOSIS — C3490 Malignant neoplasm of unspecified part of unspecified bronchus or lung: Secondary | ICD-10-CM | POA: Insufficient documentation

## 2010-10-27 DIAGNOSIS — Z902 Acquired absence of lung [part of]: Secondary | ICD-10-CM | POA: Insufficient documentation

## 2010-10-27 DIAGNOSIS — C3430 Malignant neoplasm of lower lobe, unspecified bronchus or lung: Secondary | ICD-10-CM

## 2010-10-27 LAB — CBC WITH DIFFERENTIAL/PLATELET
BASO%: 0.4 % (ref 0.0–2.0)
EOS%: 3.1 % (ref 0.0–7.0)
MCH: 32 pg (ref 25.1–34.0)
MCHC: 34.5 g/dL (ref 31.5–36.0)
MCV: 92.8 fL (ref 79.5–101.0)
MONO%: 7 % (ref 0.0–14.0)
RBC: 4.31 10*6/uL (ref 3.70–5.45)
RDW: 13.2 % (ref 11.2–14.5)
lymph#: 2.4 10*3/uL (ref 0.9–3.3)

## 2010-10-27 LAB — CMP (CANCER CENTER ONLY)
ALT(SGPT): 50 U/L — ABNORMAL HIGH (ref 10–47)
AST: 59 U/L — ABNORMAL HIGH (ref 11–38)
Albumin: 3.6 g/dL (ref 3.3–5.5)
Alkaline Phosphatase: 70 U/L (ref 26–84)
BUN, Bld: 15 mg/dL (ref 7–22)
Calcium: 9.2 mg/dL (ref 8.0–10.3)
Chloride: 95 mEq/L — ABNORMAL LOW (ref 98–108)
Potassium: 4.4 mEq/L (ref 3.3–4.7)
Sodium: 144 mEq/L (ref 128–145)
Total Protein: 7.7 g/dL (ref 6.4–8.1)

## 2010-10-27 MED ORDER — IOHEXOL 300 MG/ML  SOLN
100.0000 mL | Freq: Once | INTRAMUSCULAR | Status: AC | PRN
  Administered 2010-10-27: 100 mL via INTRAVENOUS

## 2010-10-29 ENCOUNTER — Other Ambulatory Visit: Payer: Self-pay | Admitting: Internal Medicine

## 2010-10-29 ENCOUNTER — Encounter (HOSPITAL_BASED_OUTPATIENT_CLINIC_OR_DEPARTMENT_OTHER): Payer: BC Managed Care – PPO | Admitting: Internal Medicine

## 2010-10-29 DIAGNOSIS — C3430 Malignant neoplasm of lower lobe, unspecified bronchus or lung: Secondary | ICD-10-CM

## 2010-10-29 DIAGNOSIS — C3490 Malignant neoplasm of unspecified part of unspecified bronchus or lung: Secondary | ICD-10-CM

## 2010-10-30 ENCOUNTER — Telehealth: Payer: Self-pay | Admitting: Family Medicine

## 2010-10-30 NOTE — Telephone Encounter (Signed)
Added lab info to 7/6 lab

## 2010-10-30 NOTE — Telephone Encounter (Signed)
---  790.6  790.4  hgba1c, lipid, hep---- KP

## 2010-11-05 ENCOUNTER — Other Ambulatory Visit: Payer: Self-pay | Admitting: Family Medicine

## 2010-11-05 DIAGNOSIS — R7989 Other specified abnormal findings of blood chemistry: Secondary | ICD-10-CM

## 2010-11-06 ENCOUNTER — Other Ambulatory Visit (INDEPENDENT_AMBULATORY_CARE_PROVIDER_SITE_OTHER): Payer: BC Managed Care – PPO

## 2010-11-06 DIAGNOSIS — R7402 Elevation of levels of lactic acid dehydrogenase (LDH): Secondary | ICD-10-CM

## 2010-11-06 DIAGNOSIS — R7989 Other specified abnormal findings of blood chemistry: Secondary | ICD-10-CM

## 2010-11-06 LAB — LIPID PANEL
Cholesterol: 175 mg/dL (ref 0–200)
Total CHOL/HDL Ratio: 4
Triglycerides: 174 mg/dL — ABNORMAL HIGH (ref 0.0–149.0)

## 2010-11-06 LAB — HEPATIC FUNCTION PANEL
AST: 52 U/L — ABNORMAL HIGH (ref 0–37)
Albumin: 4 g/dL (ref 3.5–5.2)
Alkaline Phosphatase: 63 U/L (ref 39–117)

## 2010-11-06 NOTE — Progress Notes (Signed)
Labs only

## 2010-11-09 ENCOUNTER — Ambulatory Visit (INDEPENDENT_AMBULATORY_CARE_PROVIDER_SITE_OTHER): Payer: BC Managed Care – PPO | Admitting: Family Medicine

## 2010-11-09 ENCOUNTER — Encounter: Payer: Self-pay | Admitting: Family Medicine

## 2010-11-09 VITALS — BP 142/80 | HR 78 | Temp 98.9°F | Wt 279.8 lb

## 2010-11-09 DIAGNOSIS — M129 Arthropathy, unspecified: Secondary | ICD-10-CM

## 2010-11-09 DIAGNOSIS — M1990 Unspecified osteoarthritis, unspecified site: Secondary | ICD-10-CM

## 2010-11-09 DIAGNOSIS — R7309 Other abnormal glucose: Secondary | ICD-10-CM

## 2010-11-09 DIAGNOSIS — I10 Essential (primary) hypertension: Secondary | ICD-10-CM

## 2010-11-09 DIAGNOSIS — R739 Hyperglycemia, unspecified: Secondary | ICD-10-CM

## 2010-11-09 MED ORDER — VALSARTAN-HYDROCHLOROTHIAZIDE 160-12.5 MG PO TABS: 1.0000 | ORAL_TABLET | Freq: Every day | ORAL | Status: DC

## 2010-11-09 MED ORDER — MELOXICAM 7.5 MG PO TABS: 7.5000 mg | ORAL_TABLET | ORAL | Status: DC | PRN

## 2010-11-09 NOTE — Patient Instructions (Signed)

## 2010-11-09 NOTE — Progress Notes (Signed)
  Subjective:    Patient here for follow-up of elevated blood pressure.  She is not exercising and is adherent to a low-salt diet.  Blood pressure not checked at home. Cardiac symptoms: none. Patient denies: chest pain, chest pressure/discomfort, dyspnea, exertional chest pressure/discomfort, fatigue and palpitations. Cardiovascular risk factors: hypertension, obesity (BMI >= 30 kg/m2) and sedentary lifestyle. Use of agents associated with hypertension: none. History of target organ damage: none.  The following portions of the patient's history were reviewed and updated as appropriate: allergies, current medications, past family history, past medical history, past social history, past surgical history and problem list.  Review of Systems Pertinent items are noted in HPI.     Objective:    BP 142/80  Pulse 78  Temp(Src) 98.9 F (37.2 C) (Oral)  Wt 279 lb 12.8 oz (126.916 kg)  SpO2 97% General appearance: alert, cooperative, appears stated age and no distress Neck: no adenopathy, no carotid bruit, no JVD, supple, symmetrical, trachea midline and thyroid not enlarged, symmetric, no tenderness/mass/nodules Lungs: clear to auscultation bilaterally Heart: S1, S2 normal Extremities: trace pitting edema LE b/l   Assessment:    Hypertension, stage 1 . Evidence of target organ damage: none.   Hyperlipidemia  arthritis Plan:    Medication: no change. Regular aerobic exercise. Follow up: 3 months and as needed.  D/w pt diet and exercise

## 2010-11-16 ENCOUNTER — Encounter: Payer: Self-pay | Admitting: *Deleted

## 2011-02-09 ENCOUNTER — Other Ambulatory Visit: Payer: BC Managed Care – PPO

## 2011-03-02 ENCOUNTER — Other Ambulatory Visit: Payer: Self-pay | Admitting: Family Medicine

## 2011-03-02 DIAGNOSIS — R7989 Other specified abnormal findings of blood chemistry: Secondary | ICD-10-CM

## 2011-03-02 DIAGNOSIS — E785 Hyperlipidemia, unspecified: Secondary | ICD-10-CM

## 2011-03-02 DIAGNOSIS — I10 Essential (primary) hypertension: Secondary | ICD-10-CM

## 2011-03-03 ENCOUNTER — Other Ambulatory Visit (INDEPENDENT_AMBULATORY_CARE_PROVIDER_SITE_OTHER): Payer: BC Managed Care – PPO

## 2011-03-03 DIAGNOSIS — I10 Essential (primary) hypertension: Secondary | ICD-10-CM

## 2011-03-03 DIAGNOSIS — R7989 Other specified abnormal findings of blood chemistry: Secondary | ICD-10-CM

## 2011-03-03 DIAGNOSIS — E785 Hyperlipidemia, unspecified: Secondary | ICD-10-CM

## 2011-03-03 LAB — HEPATIC FUNCTION PANEL
ALT: 45 U/L — ABNORMAL HIGH (ref 0–35)
Albumin: 3.8 g/dL (ref 3.5–5.2)
Total Protein: 7.5 g/dL (ref 6.0–8.3)

## 2011-03-03 LAB — HEMOGLOBIN A1C: Hgb A1c MFr Bld: 5.6 % (ref 4.6–6.5)

## 2011-03-03 LAB — BASIC METABOLIC PANEL
Chloride: 105 mEq/L (ref 96–112)
GFR: 70.27 mL/min (ref >60.00)
Potassium: 4.1 mEq/L (ref 3.5–5.1)
Sodium: 141 mEq/L (ref 135–145)

## 2011-03-03 LAB — POCT URINALYSIS DIPSTICK
Bilirubin, UA: NEGATIVE
Blood, UA: NEGATIVE
Glucose, UA: NEGATIVE
Leukocytes, UA: NEGATIVE
Nitrite, UA: NEGATIVE
Urobilinogen, UA: 0.2

## 2011-03-03 LAB — LIPID PANEL
Cholesterol: 152 mg/dL (ref 0–200)
HDL: 41.4 mg/dL (ref >39.00)
LDL Cholesterol: 89 mg/dL (ref 0–99)
VLDL: 21.4 mg/dL (ref 0.0–40.0)

## 2011-03-03 NOTE — Progress Notes (Signed)
Labs only

## 2011-03-08 ENCOUNTER — Encounter: Payer: Self-pay | Admitting: Family Medicine

## 2011-03-09 ENCOUNTER — Encounter: Payer: Self-pay | Admitting: Family Medicine

## 2011-03-09 ENCOUNTER — Ambulatory Visit (INDEPENDENT_AMBULATORY_CARE_PROVIDER_SITE_OTHER): Payer: BC Managed Care – PPO | Admitting: Family Medicine

## 2011-03-09 VITALS — BP 136/80 | HR 72 | Temp 97.9°F | Wt 274.0 lb

## 2011-03-09 DIAGNOSIS — K1379 Other lesions of oral mucosa: Secondary | ICD-10-CM

## 2011-03-09 DIAGNOSIS — R739 Hyperglycemia, unspecified: Secondary | ICD-10-CM

## 2011-03-09 DIAGNOSIS — M1990 Unspecified osteoarthritis, unspecified site: Secondary | ICD-10-CM

## 2011-03-09 DIAGNOSIS — I10 Essential (primary) hypertension: Secondary | ICD-10-CM

## 2011-03-09 MED ORDER — MELOXICAM 7.5 MG PO TABS: 7.5000 mg | ORAL_TABLET | ORAL | Status: DC | PRN

## 2011-03-09 MED ORDER — VALSARTAN-HYDROCHLOROTHIAZIDE 160-12.5 MG PO TABS: 1.0000 | ORAL_TABLET | Freq: Every day | ORAL | Status: DC

## 2011-03-09 NOTE — Patient Instructions (Signed)

## 2011-03-09 NOTE — Progress Notes (Signed)
  Subjective:    Patient here for follow-up of elevated blood pressure.  She is not exercising and is adherent to a low-salt diet.  Blood pressure is well controlled at home. Cardiac symptoms: none. Patient denies: chest pain, chest pressure/discomfort, claudication, dyspnea, exertional chest pressure/discomfort, fatigue, irregular heart beat, lower extremity edema, near-syncope, orthopnea, palpitations, paroxysmal nocturnal dyspnea, syncope and tachypnea. Cardiovascular risk factors: hypertension, obesity (BMI >= 30 kg/m2) and sedentary lifestyle. Use of agents associated with hypertension: none. History of target organ damage: none.  The following portions of the patient's history were reviewed and updated as appropriate: allergies, current medications, past family history, past medical history, past social history, past surgical history and problem list.  Review of Systems Pertinent items are noted in HPI.     Objective:    BP 136/80  Pulse 72  Temp(Src) 97.9 F (36.6 C) (Oral)  Wt 274 lb (124.286 kg)  SpO2 97% General appearance: alert, cooperative, appears stated age and no distress Head: Normocephalic, without obvious abnormality, atraumatic Ears: normal TM's and external ear canals both ears Nose: Nares normal. Septum midline. Mucosa normal. No drainage or sinus tenderness. Throat: abnormal findings: mild oropharyngeal erythema and papule in R side of mouth Neck: no adenopathy and thyroid not enlarged, symmetric, no tenderness/mass/nodules Lungs: clear to auscultation bilaterally Heart: regular rate and rhythm, S1, S2 normal, no murmur, click, rub or gallop Extremities: extremities normal, atraumatic, no cyanosis or edema    Assessment:    Hypertension, normal blood pressure . Evidence of target organ damage: none.    Plan:    Medication: no change. Dietary sodium restriction. Regular aerobic exercise. Check blood pressures 2-3 times weekly and record.

## 2011-04-22 ENCOUNTER — Other Ambulatory Visit (HOSPITAL_COMMUNITY): Payer: BC Managed Care – PPO

## 2011-04-26 ENCOUNTER — Other Ambulatory Visit: Payer: Self-pay | Admitting: Internal Medicine

## 2011-04-26 ENCOUNTER — Other Ambulatory Visit (HOSPITAL_BASED_OUTPATIENT_CLINIC_OR_DEPARTMENT_OTHER): Payer: BC Managed Care – PPO | Admitting: Lab

## 2011-04-26 ENCOUNTER — Ambulatory Visit (HOSPITAL_COMMUNITY)
Admission: RE | Admit: 2011-04-26 | Discharge: 2011-04-26 | Disposition: A | Discharge status: Home / Self Care | Payer: BC Managed Care – PPO | Source: Ambulatory Visit | Attending: Internal Medicine | Admitting: Internal Medicine

## 2011-04-26 DIAGNOSIS — C3490 Malignant neoplasm of unspecified part of unspecified bronchus or lung: Secondary | ICD-10-CM

## 2011-04-26 DIAGNOSIS — C3430 Malignant neoplasm of lower lobe, unspecified bronchus or lung: Secondary | ICD-10-CM

## 2011-04-26 DIAGNOSIS — Z0389 Encounter for observation for other suspected diseases and conditions ruled out: Secondary | ICD-10-CM | POA: Insufficient documentation

## 2011-04-26 LAB — CBC WITH DIFFERENTIAL/PLATELET
Basophils Absolute: 0 10*3/uL (ref 0.0–0.1)
HCT: 39.8 % (ref 34.8–46.6)
HGB: 13.5 g/dL (ref 11.6–15.9)
MONO#: 0.5 10*3/uL (ref 0.1–0.9)
NEUT#: 4.2 10*3/uL (ref 1.5–6.5)
NEUT%: 58.8 % (ref 38.4–76.8)
WBC: 7.1 10*3/uL (ref 3.9–10.3)
lymph#: 2.2 10*3/uL (ref 0.9–3.3)

## 2011-04-26 LAB — CMP (CANCER CENTER ONLY)
ALT(SGPT): 43 U/L (ref 10–47)
BUN, Bld: 14 mg/dL (ref 7–22)
CO2: 33 mEq/L (ref 18–33)
Calcium: 8.9 mg/dL (ref 8.0–10.3)
Chloride: 100 mEq/L (ref 98–108)
Creat: 1.1 mg/dl (ref 0.6–1.2)

## 2011-04-26 MED ORDER — IOHEXOL 300 MG/ML  SOLN
80.0000 mL | Freq: Once | INTRAMUSCULAR | Status: AC | PRN
  Administered 2011-04-26: 80 mL via INTRAVENOUS

## 2011-04-28 ENCOUNTER — Ambulatory Visit (HOSPITAL_BASED_OUTPATIENT_CLINIC_OR_DEPARTMENT_OTHER): Payer: BC Managed Care – PPO | Admitting: Internal Medicine

## 2011-04-28 VITALS — BP 136/73 | HR 71 | Temp 97.1°F | Ht 65.0 in | Wt 271.8 lb

## 2011-04-28 DIAGNOSIS — Z85118 Personal history of other malignant neoplasm of bronchus and lung: Secondary | ICD-10-CM

## 2011-04-28 DIAGNOSIS — C3490 Malignant neoplasm of unspecified part of unspecified bronchus or lung: Secondary | ICD-10-CM

## 2011-04-28 NOTE — Progress Notes (Signed)
[facility] OFFICE PROGRESS NOTE  Loreen Freud, DO, [place] [address] [facility] [address] [place] [address]  DIAGNOSIS: Stage IA (T1a, NX, MX) non-small cell lung cancer, squamous cell carcinoma diagnosed in May 2010.  PRIOR THERAPY: Status post right upper lobe superior segmentectomy under the care of Dr. Edwyna Shell on 10/18/2008.  CURRENT THERAPY: Observation.  INTERVAL HISTORY: Elizabeth [name] 60 y.o. female returns to the clinic today for six-month followup visit. The patient has no complaints today she is feeling fine. She denied having any significant weight loss or night sweats, no chest pain or shortness of breath, no cough or hemoptysis. She has repeat CT scan of the chest performed recently and she is here for evaluation and discussion of her scan results.  MEDICAL HISTORY: Past Medical History  Diagnosis Date  . Cancer     lung ca  . Hyperlipidemia   . Hypertension   . Low back pain   . Vitamin D deficiency     ALLERGIES:  is allergic to penicillins.  MEDICATIONS:  Current Outpatient Prescriptions  Medication Sig Dispense Refill  . Cholecalciferol (VITAMIN D) 2000 UNITS tablet Take 3,000 Units by mouth daily.       . meloxicam (MOBIC) 7.5 MG tablet Take 1 tablet (7.5 mg total) by mouth as needed.  90 tablet  1  . valsartan-hydrochlorothiazide (DIOVAN HCT) 160-12.5 MG per tablet Take 1 tablet by mouth daily.  30 tablet  5    SURGICAL HISTORY:  Past Surgical History  Procedure Date  . Cholecystectomy   . Tubal ligation     REVIEW OF SYSTEMS:  A comprehensive review of systems was negative.   PHYSICAL EXAMINATION: General appearance: alert, cooperative and no distress Head: Normocephalic, without obvious abnormality, atraumatic Neck: no adenopathy Lymph nodes: Cervical, supraclavicular, and axillary nodes normal. Resp: clear to auscultation bilaterally Cardio: regular rate and rhythm, S1, S2 normal, no murmur, click, rub or  gallop GI: soft, non-tender; bowel sounds normal; no masses,  no organomegaly Extremities: extremities normal, atraumatic, no cyanosis or edema Neurologic: Alert and oriented X 3, normal strength and tone. Normal symmetric reflexes. Normal coordination and gait  ECOG PERFORMANCE STATUS: 0 - Asymptomatic  Blood pressure 136/73, pulse 71, temperature 97.1 F (36.2 C), temperature source Oral, height 5\' 5"  (1.651 m), weight 271 lb 12.8 oz (123.288 kg).  LABORATORY DATA: Lab Results  Component Value Date   WBC 7.1 04/26/2011   HGB 13.5 04/26/2011   HCT 39.8 04/26/2011   MCV 93.6 04/26/2011   PLT 193 04/26/2011      Chemistry      Component Value Date/Time   NA 143 04/26/2011 0943   NA 141 03/03/2011 1052   K 4.6 04/26/2011 0943   K 4.1 03/03/2011 1052   CL 100 04/26/2011 0943   CL 105 03/03/2011 1052   CO2 33 04/26/2011 0943   CO2 30 03/03/2011 1052   BUN 14 04/26/2011 0943   BUN 13 03/03/2011 1052   CREATININE 1.1 04/26/2011 0943   CREATININE 0.9 03/03/2011 1052      Component Value Date/Time   CALCIUM 8.9 04/26/2011 0943   CALCIUM 8.9 03/03/2011 1052   ALKPHOS 74 04/26/2011 0943   ALKPHOS 60 03/03/2011 1052   AST 41* 04/26/2011 0943   AST 47* 03/03/2011 1052   ALT 45* 03/03/2011 1052   BILITOT 0.80 04/26/2011 0943   BILITOT 0.7 03/03/2011 1052       RADIOGRAPHIC STUDIES: Ct Chest W Contrast  04/26/2011  *RADIOLOGY REPORT*  Clinical Data: evaluate for lung cancer  CT CHEST WITH CONTRAST  Technique:  Multidetector CT imaging of the chest was performed following the standard protocol during bolus administration of intravenous contrast.  Contrast: 80mL OMNIPAQUE IOHEXOL 300 MG/ML IV SOLN  Comparison: 10/27/2010  Findings: No enlarged supraclavicular or axillary adenopathy.  The subcarinal lymph node is stable measuring 1.7 cm, image 32.  There is a precarinal lymph node which measures 1 cm, image number 26.  This is compared with the same previously.  No pericardial or  pleural effusions identified.  Stable postoperative change within the right lower lobe.  The lungs appear clear.  The review of the visualized osseous structures shows thoracic spondylosis.  No worrisome lytic or sclerotic lesion.  Limited imaging through the upper abdomen is unremarkable.  IMPRESSION:  1.  Stable CT of the chest.  Original Report Authenticated By: Rosealee Albee, M.D.    ASSESSMENT: Ms. a very pleasant 60 years old white female with history of stage IA non-small cell lung cancer status post right upper lobe superior segmentectomy in June of 2010. The patient is doing fine and she has no evidence for disease recurrence. I discussed the scan results with her.   PLAN: I recommended for her continuous observation. I would see her back for followup visit in 6 months with repeat CT scan of the chest. She was advised to call me immediately if she has any questions having symptoms in the interval   All questions were answered. The patient knows to call the clinic with any problems, questions or concerns. We can certainly see the patient much sooner if necessary.

## 2011-05-27 ENCOUNTER — Other Ambulatory Visit (HOSPITAL_COMMUNITY): Payer: BC Managed Care – PPO

## 2011-06-04 ENCOUNTER — Other Ambulatory Visit: Payer: Self-pay

## 2011-06-04 DIAGNOSIS — I10 Essential (primary) hypertension: Secondary | ICD-10-CM

## 2011-06-04 MED ORDER — DIOVAN HCT 160-12.5 MG PO TABS: 1.0000 | ORAL_TABLET | Freq: Every day | ORAL | Status: DC

## 2011-06-04 NOTE — Telephone Encounter (Signed)
Patient called and needed the Diovan DAW so she could use the discount card. Rx faxed      KP

## 2011-06-21 ENCOUNTER — Other Ambulatory Visit (INDEPENDENT_AMBULATORY_CARE_PROVIDER_SITE_OTHER): Payer: BC Managed Care – PPO

## 2011-06-21 DIAGNOSIS — I10 Essential (primary) hypertension: Secondary | ICD-10-CM

## 2011-06-21 DIAGNOSIS — C3490 Malignant neoplasm of unspecified part of unspecified bronchus or lung: Secondary | ICD-10-CM

## 2011-06-21 DIAGNOSIS — R739 Hyperglycemia, unspecified: Secondary | ICD-10-CM

## 2011-06-21 DIAGNOSIS — R7309 Other abnormal glucose: Secondary | ICD-10-CM

## 2011-06-21 LAB — LIPID PANEL
Cholesterol: 153 mg/dL (ref 0–200)
VLDL: 20.6 mg/dL (ref 0.0–40.0)

## 2011-06-21 LAB — HEPATIC FUNCTION PANEL
ALT: 28 U/L (ref 0–35)
AST: 30 U/L (ref 0–37)
Bilirubin, Direct: 0.1 mg/dL (ref 0.0–0.3)
Total Protein: 7.3 g/dL (ref 6.0–8.3)

## 2011-06-21 LAB — BASIC METABOLIC PANEL
BUN: 13 mg/dL (ref 6–23)
Calcium: 9.1 mg/dL (ref 8.4–10.5)
Creatinine, Ser: 1 mg/dL (ref 0.4–1.2)
GFR: 60.47 mL/min (ref >60.00)
Glucose, Bld: 98 mg/dL (ref 70–99)
Potassium: 4.3 mEq/L (ref 3.5–5.1)

## 2011-06-21 LAB — HEMOGLOBIN A1C: Hgb A1c MFr Bld: 5.7 % (ref 4.6–6.5)

## 2011-06-28 ENCOUNTER — Encounter: Payer: Self-pay | Admitting: Family Medicine

## 2011-06-28 ENCOUNTER — Ambulatory Visit (INDEPENDENT_AMBULATORY_CARE_PROVIDER_SITE_OTHER): Payer: BC Managed Care – PPO | Admitting: Family Medicine

## 2011-06-28 VITALS — BP 142/70 | HR 73 | Temp 98.2°F | Wt 265.8 lb

## 2011-06-28 DIAGNOSIS — M25559 Pain in unspecified hip: Secondary | ICD-10-CM

## 2011-06-28 MED ORDER — MELOXICAM 15 MG PO TABS: 15.0000 mg | ORAL_TABLET | Freq: Every day | ORAL | Status: DC

## 2011-06-28 NOTE — Progress Notes (Signed)
  Subjective:    Patient ID: Elizabeth [name], female    DOB: Oct 07, 1950, 61 y.o.   MRN: [id number]  HPI Pt here to review labs which were normal.  She also c/o R shoulder  Pain x 2 months which is improving and R hip pain that has been bothering her a week.  Shoulder pain has almost resolved.      Review of Systems As above    Objective:   Physical Exam  Constitutional: She is oriented to person, place, and time. She appears well-developed and well-nourished.  Musculoskeletal: Normal range of motion. She exhibits tenderness. She exhibits no edema.       Pain R hip with walking and sitting long periods No pain with palpation  Neurological: She is alert and oriented to person, place, and time.  Psychiatric: She has a normal mood and affect. Her behavior is normal. Judgment and thought content normal.          Assessment & Plan:

## 2011-06-28 NOTE — Patient Instructions (Signed)
Hip Pain The hips join the upper legs to the lower pelvis. The bones, cartilage, tendons, and muscles of the hip joint perform a lot of work each day holding your body weight and allowing you to move around. Hip pain is a common symptom. It can range from a minor ache to severe pain on 1 or both hips. Pain may be felt on the inside of the hip joint near the groin, or the outside near the buttocks and upper thigh. There may be swelling or stiffness as well. It occurs more often when a person walks or performs activity. There are many reasons hip pain can develop. CAUSES  It is important to work with your caregiver to identify the cause since many conditions can impact the bones, cartilage, muscles, and tendons of the hips. Causes for hip pain include:  Broken (fractured) bones.   Separation of the thighbone from the hip socket (dislocation).   Torn cartilage of the hip joint.   Swelling (inflammation) of a tendon (tendonitis), the sac within the hip joint (bursitis), or a joint.   A weakening in the abdominal wall (hernia), affecting the nerves to the hip.   Arthritis in the hip joint or lining of the hip joint.   Pinched nerves in the back, hip, or upper thigh.   A bulging disc in the spine (herniated disc).   Rarely, bone infection or cancer.  DIAGNOSIS  The location of your hip pain will help your caregiver understand what may be causing the pain. A diagnosis is based on your medical history, your symptoms, results from your physical exam, and results from diagnostic tests. Diagnostic tests may include X-ray exams, a computerized magnetic scan (magnetic resonance imaging, MRI), or bone scan. TREATMENT  Treatment will depend on the cause of your hip pain. Treatment may include:  Limiting activities and resting until symptoms improve.   Crutches or other walking supports (a cane or brace).   Ice, elevation, and compression.   Physical therapy or home exercises.   Shoe inserts or  special shoes.   Losing weight.   Medications to reduce pain.   Undergoing surgery.  HOME CARE INSTRUCTIONS   Only take over-the-counter or prescription medicines for pain, discomfort, or fever as directed by your caregiver.   Put ice on the injured area:   Put ice in a plastic bag.   Place a towel between your skin and the bag.   Leave the ice on for 15 to 20 minutes at a time, 3 to 4 times a day.   Keep your leg raised (elevated) when possible to lessen swelling.   Avoid activities that cause pain.   Follow specific exercises as directed by your caregiver.   Sleep with a pillow between your legs on your most comfortable side.   Record how often you have hip pain, the location of the pain, and what it feels like. This information may be helpful to you and your caregiver.   Ask your caregiver about returning to work or sports and whether you should drive.   Follow up with your caregiver for further exams, therapy, or testing as directed.  SEEK MEDICAL CARE IF:   Your pain or swelling continues or worsens after 1 week.   You are feeling unwell or have chills.   You have increasing difficulty with walking.   You have a loss of sensation or other new symptoms.   You have questions or concerns.  SEEK IMMEDIATE MEDICAL CARE IF:   You   cannot put weight on the affected hip.   You have fallen.   You have a sudden increase in pain and swelling in your hip.   You have a fever.  MAKE SURE YOU:   Understand these instructions.   Will watch your condition.   Will get help right away if you are not doing well or get worse.  Document Released: 10/07/2009 Document Revised: 12/30/2010 Document Reviewed: 10/07/2009 ExitCare Patient Information 2012 [employer]. 

## 2011-06-29 DIAGNOSIS — M25552 Pain in left hip: Secondary | ICD-10-CM | POA: Insufficient documentation

## 2011-06-29 NOTE — Assessment & Plan Note (Signed)
mobic , heat, rest Ortho vs pt prn

## 2011-06-30 ENCOUNTER — Encounter (HOSPITAL_BASED_OUTPATIENT_CLINIC_OR_DEPARTMENT_OTHER): Payer: Self-pay | Admitting: *Deleted

## 2011-06-30 ENCOUNTER — Emergency Department (HOSPITAL_BASED_OUTPATIENT_CLINIC_OR_DEPARTMENT_OTHER)
Admission: EM | Admit: 2011-06-30 | Discharge: 2011-06-30 | Disposition: A | Discharge status: Home / Self Care | Payer: Worker's Compensation | Attending: Emergency Medicine | Admitting: Emergency Medicine

## 2011-06-30 DIAGNOSIS — Y9289 Other specified places as the place of occurrence of the external cause: Secondary | ICD-10-CM | POA: Insufficient documentation

## 2011-06-30 DIAGNOSIS — E785 Hyperlipidemia, unspecified: Secondary | ICD-10-CM | POA: Insufficient documentation

## 2011-06-30 DIAGNOSIS — S61209A Unspecified open wound of unspecified finger without damage to nail, initial encounter: Secondary | ICD-10-CM | POA: Insufficient documentation

## 2011-06-30 DIAGNOSIS — W268XXA Contact with other sharp object(s), not elsewhere classified, initial encounter: Secondary | ICD-10-CM | POA: Insufficient documentation

## 2011-06-30 DIAGNOSIS — S61219A Laceration without foreign body of unspecified finger without damage to nail, initial encounter: Secondary | ICD-10-CM

## 2011-06-30 NOTE — Discharge Instructions (Signed)
Tissue Adhesive Wound Care °A wound can be repaired by using tissue adhesive. Tissue adhesive holds the skin together and allows faster healing. It forms a strong bond on the skin in about 1 minute and reaches its full strength in about 2 or 3 minutes. The adhesive disappears naturally while healing. Follow up is required if your caregiver wants to recheck for infection and to make sure your wound is healing properly.  °You may need a tetanus shot if: °· You cannot remember when you had your last tetanus shot.  °· You have never had a tetanus shot.  °· The injury broke your skin.  °If you got a tetanus shot, your arm may swell, get red, and feel warm to the touch. This is common and not a problem. If you need a tetanus shot and you choose not to have one, there is a rare chance of getting tetanus. Sickness from tetanus can be serious. °HOME CARE INSTRUCTIONS  °· Only take over-the-counter or prescription medicines for pain, discomfort, or fever as directed by your caregiver.  °· Showers are allowed. Do not soak the area containing the tissue adhesive. Do not take baths, swim, or use hot tubs. Do not use any soaps or ointments on the wound until it has healed.  °· If a bandage (dressing) has been applied, follow your caregiver's instructions for how often to change the dressing.  °· Keep the dressing dry if one has been applied.  °· Do not scratch, pick, or rub the adhesive.  °· Do not place tape over the adhesive. The adhesive could come off when pulling the tape off.  °· Protect the wound from further injury until it is healed.  °· Protect the wound from sun and tanning bed exposure while it is healing and for several weeks after healing.  °· Keep all follow-up appointments as directed by your caregiver.  °SEEK IMMEDIATE MEDICAL CARE IF:  °· Your wound becomes red, swollen, hot, or tender.  °· You have increasing pain in the wound.  °· You have a red streak that goes away from the wound.  °· You have pus coming  from the wound.  °· You have a fever.  °· You have shaking chills.  °· There is a bad smell coming from the wound.  °· The wound or adhesive breaks open.  °MAKE SURE YOU:  °· Understand these instructions.  °· Will watch your condition.  °· Will get help right away if you are not doing well or get worse.  °Document Released: 10/13/2000 Document Revised: 12/31/2010 Document Reviewed: 08/23/2010 °ExitCare® Patient Information ©2012 [employer]. °

## 2011-06-30 NOTE — ED Notes (Signed)
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

## 2011-06-30 NOTE — ED Provider Notes (Signed)
History     CSN: [id number]  Arrival date & time 06/30/11  1142   First MD Initiated Contact with Patient 06/30/11 1235      Chief Complaint  Patient presents with  . Finger Injury    (Consider location/radiation/quality/duration/timing/severity/associated sxs/prior treatment) Patient is a 61 y.o. female presenting with hand pain. The history is provided by the patient.  Hand Pain This is a new problem. The current episode started today. The problem occurs constantly. Pertinent negatives include no numbness. The symptoms are aggravated by bending. She has tried immobilization for the symptoms. The treatment provided no relief.  Pt reports she cut her finger on a metal pan at work.  Pt complains of a cut to her rigth 4th finger,  Minimal gapping  Past Medical History  Diagnosis Date  . Cancer     lung ca  . Hyperlipidemia   . Hypertension   . Low back pain   . Vitamin d deficiency   . Lung cancer     Past Surgical History  Procedure Date  . Cholecystectomy   . Tubal ligation   . Lung removal, partial     Family History  Problem Relation Age of Onset  . Hypertension Mother   . Stroke Mother   . Cancer Mother     lung  . Heart disease Father     cabg  . Cancer Brother     skin    History  Substance Use Topics  . Smoking status: Never Smoker   . Smokeless tobacco: Not on file  . Alcohol Use: No    OB History    Grav Para Term Preterm Abortions TAB SAB Ect Mult Living                  Review of Systems  Skin: Positive for wound.  Neurological: Negative for numbness.  All other systems reviewed and are negative.    Allergies  Penicillins  Home Medications   Current Outpatient Rx  Name Route Sig Dispense Refill  . VITAMIN B-12 CR PO Oral Take by mouth.    Marland Kitchen BIOTIN 5000 MCG PO CAPS Oral Take 1 capsule by mouth daily.    Marland Kitchen VITAMIN D 2000 UNITS PO TABS Oral Take 3,000 Units by mouth daily.     Marland Kitchen DIOVAN HCT 160-12.5 MG PO TABS Oral Take 1 tablet by  mouth daily. 30 tablet 5    Dispense as written.  . MELOXICAM 15 MG PO TABS Oral Take 1 tablet (15 mg total) by mouth daily. 90 tablet 1    BP 148/78  Pulse 70  Temp(Src) 97.6 F (36.4 C) (Oral)  Resp 20  Ht 5\' 4"  (1.626 m)  Wt 260 lb (117.935 kg)  BMI 44.63 kg/m2  SpO2 98%  Physical Exam  Nursing note and vitals reviewed. Constitutional: She is oriented to person, place, and time. She appears well-developed and well-nourished.  HENT:  Head: Normocephalic.  Musculoskeletal:       4mm superficial flap laceration right 4th finger  from  Neurological: She is alert and oriented to person, place, and time.  Skin: Skin is warm and dry.  Psychiatric: She has a normal mood and affect.    ED Course  Procedures (including critical care time)  Labs Reviewed - No data to display No results found.   No diagnosis found.    MDM      steristips x 2,  Bandage applied,     Langston Masker, [place] 06/30/11 1550

## 2011-07-01 NOTE — ED Provider Notes (Signed)
Medical screening examination/treatment/procedure(s) were performed by non-physician practitioner and as supervising physician I was immediately available for consultation/collaboration.   Rolan Bucco, MD 07/01/11 1034

## 2011-07-07 ENCOUNTER — Emergency Department (INDEPENDENT_AMBULATORY_CARE_PROVIDER_SITE_OTHER): Payer: Worker's Compensation

## 2011-07-07 ENCOUNTER — Encounter (HOSPITAL_BASED_OUTPATIENT_CLINIC_OR_DEPARTMENT_OTHER): Payer: Self-pay

## 2011-07-07 ENCOUNTER — Emergency Department (HOSPITAL_BASED_OUTPATIENT_CLINIC_OR_DEPARTMENT_OTHER)
Admission: EM | Admit: 2011-07-07 | Discharge: 2011-07-07 | Disposition: A | Discharge status: Home Health | Payer: Worker's Compensation | Attending: Emergency Medicine | Admitting: Emergency Medicine

## 2011-07-07 DIAGNOSIS — E785 Hyperlipidemia, unspecified: Secondary | ICD-10-CM | POA: Insufficient documentation

## 2011-07-07 DIAGNOSIS — S8990XA Unspecified injury of unspecified lower leg, initial encounter: Secondary | ICD-10-CM

## 2011-07-07 DIAGNOSIS — E559 Vitamin D deficiency, unspecified: Secondary | ICD-10-CM | POA: Insufficient documentation

## 2011-07-07 DIAGNOSIS — M25569 Pain in unspecified knee: Secondary | ICD-10-CM

## 2011-07-07 DIAGNOSIS — Y990 Civilian activity done for income or pay: Secondary | ICD-10-CM | POA: Insufficient documentation

## 2011-07-07 DIAGNOSIS — S8390XA Sprain of unspecified site of unspecified knee, initial encounter: Secondary | ICD-10-CM

## 2011-07-07 DIAGNOSIS — I10 Essential (primary) hypertension: Secondary | ICD-10-CM | POA: Insufficient documentation

## 2011-07-07 DIAGNOSIS — Y9289 Other specified places as the place of occurrence of the external cause: Secondary | ICD-10-CM | POA: Insufficient documentation

## 2011-07-07 DIAGNOSIS — X500XXA Overexertion from strenuous movement or load, initial encounter: Secondary | ICD-10-CM | POA: Insufficient documentation

## 2011-07-07 DIAGNOSIS — IMO0002 Reserved for concepts with insufficient information to code with codable children: Secondary | ICD-10-CM | POA: Insufficient documentation

## 2011-07-07 MED ORDER — HYDROCODONE-ACETAMINOPHEN 5-500 MG PO TABS: 1.0000 | ORAL_TABLET | Freq: Four times a day (QID) | ORAL | Status: AC | PRN

## 2011-07-07 NOTE — Discharge Instructions (Signed)
Joint Sprain A sprain is a tear or stretch in the ligaments that hold a joint together. Severe sprains may need as long as 3-6 weeks of immobilization and/or exercises to heal completely. Sprained joints should be rested and protected. If not, they can become unstable and prone to re-injury. Proper treatment can reduce your pain, shorten the period of disability, and reduce the risk of repeated injuries. TREATMENT   Rest and elevate the injured joint to reduce pain and swelling.   Apply ice packs to the injury for 20-30 minutes every 2-3 hours for the next 2-3 days.   Keep the injury wrapped in a compression bandage or splint as long as the joint is painful or as instructed by your caregiver.   Do not use the injured joint until it is completely healed to prevent re-injury and chronic instability. Follow the instructions of your caregiver.   Long-term sprain management may require exercises and/or treatment by a physical therapist. Taping or special braces may help stabilize the joint until it is completely better.  SEEK MEDICAL CARE IF:   You develop increased pain or swelling of the joint.   You develop increasing redness and warmth of the joint.   You develop a fever.   It becomes stiff.   Your hand or foot gets cold or numb.  Document Released: 05/27/2004 Document Revised: 04/08/2011 Document Reviewed: 05/06/2008 [facility] Patient Information 2012 [place], [place].Knee Pain The knee is the complex joint between your thigh and your lower leg. It is made up of bones, tendons, ligaments, and cartilage. The bones that make up the knee are:  The femur in the thigh.   The tibia and fibula in the lower leg.   The patella or kneecap riding in the groove on the lower femur.  CAUSES  Knee pain is a common complaint with many causes. A few of these causes are:  Injury, such as:   A ruptured ligament or tendon injury.   Torn cartilage.   Medical conditions, such as:   Gout    Arthritis   Infections   Overuse, over training or overdoing a physical activity.  Knee pain can be minor or severe. Knee pain can accompany debilitating injury. Minor knee problems often respond well to self-care measures or get well on their own. More serious injuries may need medical intervention or even surgery. SYMPTOMS The knee is complex. Symptoms of knee problems can vary widely. Some of the problems are:  Pain with movement and weight bearing.   Swelling and tenderness.   Buckling of the knee.   Inability to straighten or extend your knee.   Your knee locks and you cannot straighten it.   Warmth and redness with pain and fever.   Deformity or dislocation of the kneecap.  DIAGNOSIS  Determining what is wrong may be very straight forward such as when there is an injury. It can also be challenging because of the complexity of the knee. Tests to make a diagnosis may include:  Your caregiver taking a history and doing a physical exam.   Routine X-rays can be used to rule out other problems. X-rays will not reveal a cartilage tear. Some injuries of the knee can be diagnosed by:   Arthroscopy a surgical technique by which a small video camera is inserted through tiny incisions on the sides of the knee. This procedure is used to examine and repair internal knee joint problems. Tiny instruments can be used during arthroscopy to repair the torn knee cartilage (  meniscus).   Arthrography is a radiology technique. A contrast liquid is directly injected into the knee joint. Internal structures of the knee joint then become visible on X-ray film.   An MRI scan is a non x-ray radiology procedure in which magnetic fields and a computer produce two- or three-dimensional images of the inside of the knee. Cartilage tears are often visible using an MRI scanner. MRI scans have largely replaced arthrography in diagnosing cartilage tears of the knee.   Blood work.   Examination of the fluid  that helps to lubricate the knee joint (synovial fluid). This is done by taking a sample out using a needle and a syringe.  TREATMENT The treatment of knee problems depends on the cause. Some of these treatments are:  Depending on the injury, proper casting, splinting, surgery or physical therapy care will be needed.   Give yourself adequate recovery time. Do not overuse your joints. If you begin to get sore during workout routines, back off. Slow down or do fewer repetitions.   For repetitive activities such as cycling or running, maintain your strength and nutrition.   Alternate muscle groups. For example if you are a weight lifter, work the upper body on one day and the lower body the next.   Either tight or weak muscles do not give the proper support for your knee. Tight or weak muscles do not absorb the stress placed on the knee joint. Keep the muscles surrounding the knee strong.   Take care of mechanical problems.   If you have flat feet, orthotics or special shoes may help. See your caregiver if you need help.   Arch supports, sometimes with wedges on the inner or outer aspect of the heel, can help. These can shift pressure away from the side of the knee most bothered by osteoarthritis.   A brace called an "unloader" brace also may be used to help ease the pressure on the most arthritic side of the knee.   If your caregiver has prescribed crutches, braces, wraps or ice, use as directed. The acronym for this is PRICE. This means protection, rest, ice, compression and elevation.   Nonsteroidal anti-inflammatory drugs (NSAID's), can help relieve pain. But if taken immediately after an injury, they may actually increase swelling. Take NSAID's with food in your stomach. Stop them if you develop stomach problems. Do not take these if you have a history of ulcers, stomach pain or bleeding from the bowel. Do not take without your caregiver's approval if you have problems with fluid retention,  heart failure, or kidney problems.   For ongoing knee problems, physical therapy may be helpful.   Glucosamine and chondroitin are over-the-counter dietary supplements. Both may help relieve the pain of osteoarthritis in the knee. These medicines are different from the usual anti-inflammatory drugs. Glucosamine may decrease the rate of cartilage destruction.   Injections of a corticosteroid drug into your knee joint may help reduce the symptoms of an arthritis flare-up. They may provide pain relief that lasts a few months. You may have to wait a few months between injections. The injections do have a small increased risk of infection, water retention and elevated blood sugar levels.   Hyaluronic acid injected into damaged joints may ease pain and provide lubrication. These injections may work by reducing inflammation. A series of shots may give relief for as long as 6 months.   Topical painkillers. Applying certain ointments to your skin may help relieve the pain and stiffness of osteoarthritis.  Ask your pharmacist for suggestions. Many over the-counter products are approved for temporary relief of arthritis pain.   In some countries, doctors often prescribe topical NSAID's for relief of chronic conditions such as arthritis and tendinitis. A review of treatment with NSAID creams found that they worked as well as oral medications but without the serious side effects.  PREVENTION  Maintain a healthy weight. Extra pounds put more strain on your joints.   Get strong, stay limber. Weak muscles are a common cause of knee injuries. Stretching is important. Include flexibility exercises in your workouts.   Be smart about exercise. If you have osteoarthritis, chronic knee pain or recurring injuries, you may need to change the way you exercise. This does not mean you have to stop being active. If your knees ache after jogging or playing basketball, consider switching to swimming, water aerobics or other  low-impact activities, at least for a few days a week. Sometimes limiting high-impact activities will provide relief.   Make sure your shoes fit well. Choose footwear that is right for your sport.   Protect your knees. Use the proper gear for knee-sensitive activities. Use kneepads when playing volleyball or laying carpet. Buckle your seat belt every time you drive. Most shattered kneecaps occur in car accidents.   Rest when you are tired.  SEEK MEDICAL CARE IF:  You have knee pain that is continual and does not seem to be getting better.  SEEK IMMEDIATE MEDICAL CARE IF:  Your knee joint feels hot to the touch and you have a high fever. MAKE SURE YOU:   Understand these instructions.   Will watch your condition.   Will get help right away if you are not doing well or get worse.  Document Released: 02/14/2007 Document Revised: 04/08/2011 Document Reviewed: 02/14/2007 [facility] Patient Information 2012 [place], [place].

## 2011-07-07 NOTE — ED Provider Notes (Addendum)
History     CSN: [id number]  Arrival date & time 07/07/11  0830   First MD Initiated Contact with Patient 07/07/11 0845      Chief Complaint  Patient presents with  . Knee Injury    (Consider location/radiation/quality/duration/timing/severity/associated sxs/prior treatment) Patient is a 61 y.o. female presenting with knee pain. The history is provided by the patient.  Knee Pain This is a new (Patient was at work and she twisted her left knee causing pain but she was able to walk and then twisted a second time causing severe pain and inability to bear weight) problem. The current episode started 1 to 2 hours ago. The problem occurs constantly. The problem has not changed since onset.Associated symptoms comments: None. The symptoms are aggravated by twisting, bending and walking. The symptoms are relieved by nothing. She has tried acetaminophen for the symptoms. The treatment provided no relief.    Past Medical History  Diagnosis Date  . Cancer     lung ca  . Hyperlipidemia   . Hypertension   . Low back pain   . Vitamin d deficiency   . Lung cancer     Past Surgical History  Procedure Date  . Cholecystectomy   . Tubal ligation   . Lung removal, partial     Family History  Problem Relation Age of Onset  . Hypertension Mother   . Stroke Mother   . Cancer Mother     lung  . Heart disease Father     cabg  . Cancer Brother     skin    History  Substance Use Topics  . Smoking status: Never Smoker   . Smokeless tobacco: Not on file  . Alcohol Use: No    OB History    Grav Para Term Preterm Abortions TAB SAB Ect Mult Living                  Review of Systems  All other systems reviewed and are negative.    Allergies  Penicillins  Home Medications   Current Outpatient Rx  Name Route Sig Dispense Refill  . BIOTIN 5000 MCG PO CAPS Oral Take 1 capsule by mouth daily.    Marland Kitchen VITAMIN D 2000 UNITS PO TABS Oral Take 3,000 Units by mouth daily.     Marland Kitchen VITAMIN  B-12 CR PO Oral Take by mouth.    . DIOVAN HCT 160-12.5 MG PO TABS Oral Take 1 tablet by mouth daily. 30 tablet 5    Dispense as written.  . MELOXICAM 15 MG PO TABS Oral Take 1 tablet (15 mg total) by mouth daily. 90 tablet 1    BP 133/69  Pulse 83  Temp(Src) 97.5 F (36.4 C) (Oral)  Resp 20  Ht 5\' 4"  (1.626 m)  Wt 260 lb (117.935 kg)  BMI 44.63 kg/m2  SpO2 96%  Physical Exam  Nursing note and vitals reviewed. Constitutional: She is oriented to person, place, and time. She appears well-developed and well-nourished. No distress.  HENT:  Head: Normocephalic and atraumatic.  Eyes: EOM are normal. Pupils are equal, round, and reactive to light.  Musculoskeletal:       Left knee: She exhibits no swelling, no effusion, no LCL laxity, normal patellar mobility and no MCL laxity. tenderness found. No medial joint line, no lateral joint line and no MCL tenderness noted.       Legs: Neurological: She is alert and oriented to person, place, and time. She has normal strength. No  sensory deficit.  Skin: Skin is warm and dry. No rash noted. No erythema.    ED Course  Procedures (including critical care time)  Labs Reviewed - No data to display Dg Knee Complete 4 Views Left  07/07/2011  *RADIOLOGY REPORT*  Clinical Data: Knee injury with pain.  LEFT KNEE - COMPLETE 4+ VIEW  Comparison: None.  Findings: No evidence for fracture.  No subluxation or dislocation. There is loss of joint space in medial compartment.  Hypertrophic spurring is visible in the medial and patellofemoral compartments. No evidence for joint effusion.  IMPRESSION: Degenerative changes without acute bony findings.  Original Report Authenticated By: ERIC A. MANSELL, M.D.     1. Knee sprain       MDM   Issue with mechanical injury to her left knee while at work. She twisted it twice and having a lot of pain in the posterior area of her left knee and pain with extension. No pain along the medial or lateral joint lines or  patella. 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 Patient will continue to take meloxicam and will be placed in knee immobilizer and given followup.        Gwyneth Sprout, MD 07/07/11 1610  Gwyneth Sprout, MD 07/07/11 [id number]

## 2011-07-07 NOTE — ED Notes (Signed)
Patient transported to X-ray via wheelchair 

## 2011-07-07 NOTE — ED Notes (Signed)
20in Knee immobilizer applied.

## 2011-07-07 NOTE — ED Notes (Signed)
Injury to left knee PTA while at work.

## 2011-07-14 ENCOUNTER — Encounter (HOSPITAL_BASED_OUTPATIENT_CLINIC_OR_DEPARTMENT_OTHER): Payer: Self-pay | Admitting: *Deleted

## 2011-07-14 ENCOUNTER — Emergency Department (HOSPITAL_BASED_OUTPATIENT_CLINIC_OR_DEPARTMENT_OTHER)
Admission: EM | Admit: 2011-07-14 | Discharge: 2011-07-14 | Disposition: A | Discharge status: Home / Self Care | Payer: BC Managed Care – PPO | Attending: Emergency Medicine | Admitting: Emergency Medicine

## 2011-07-14 DIAGNOSIS — X500XXA Overexertion from strenuous movement or load, initial encounter: Secondary | ICD-10-CM | POA: Insufficient documentation

## 2011-07-14 DIAGNOSIS — E785 Hyperlipidemia, unspecified: Secondary | ICD-10-CM | POA: Insufficient documentation

## 2011-07-14 DIAGNOSIS — IMO0002 Reserved for concepts with insufficient information to code with codable children: Secondary | ICD-10-CM | POA: Insufficient documentation

## 2011-07-14 DIAGNOSIS — I10 Essential (primary) hypertension: Secondary | ICD-10-CM | POA: Insufficient documentation

## 2011-07-14 DIAGNOSIS — Y9289 Other specified places as the place of occurrence of the external cause: Secondary | ICD-10-CM | POA: Insufficient documentation

## 2011-07-14 DIAGNOSIS — S8392XA Sprain of unspecified site of left knee, initial encounter: Secondary | ICD-10-CM

## 2011-07-14 DIAGNOSIS — Z85118 Personal history of other malignant neoplasm of bronchus and lung: Secondary | ICD-10-CM | POA: Insufficient documentation

## 2011-07-14 DIAGNOSIS — Y990 Civilian activity done for income or pay: Secondary | ICD-10-CM | POA: Insufficient documentation

## 2011-07-14 NOTE — ED Notes (Signed)
Pt amb to room 5 with slow, steady gait smiling in nad. Pt reports left knee injury while working one week ago, states she is supposed to go back to work tomorrow and cont with knee pain.

## 2011-07-14 NOTE — ED Provider Notes (Signed)
History     CSN: [id number]  Arrival date & time 07/14/11  5621   First MD Initiated Contact with Patient 07/14/11 1036      Chief Complaint  Patient presents with  . Knee Pain    (Consider location/radiation/quality/duration/timing/severity/associated sxs/prior treatment) HPI Complains of left anterior knee pain onset 07/07/2011 after she twisted her knee while at work. Pain is much improving with time she denies other injury no other complaint treated with hydrocodone-A. Pap with relief. Pain worse with moving or walking. Presents today as she was due to go back to work today and feels she needs a few more days of rest. No other complaint no other associated symptoms. Pain is mild at present, nonradiating, dull in call Past Medical History  Diagnosis Date  . Cancer     lung ca  . Hyperlipidemia   . Hypertension   . Low back pain   . Vitamin d deficiency   . Lung cancer     Past Surgical History  Procedure Date  . Cholecystectomy   . Tubal ligation   . Lung removal, partial     Family History  Problem Relation Age of Onset  . Hypertension Mother   . Stroke Mother   . Cancer Mother     lung  . Heart disease Father     cabg  . Cancer Brother     skin    History  Substance Use Topics  . Smoking status: Never Smoker   . Smokeless tobacco: Not on file  . Alcohol Use: No    OB History    Grav Para Term Preterm Abortions TAB SAB Ect Mult Living                  Review of Systems  Constitutional: Negative.   Respiratory: Positive for wheezing.   Musculoskeletal: Positive for arthralgias.       Left knee pain    Allergies  Penicillins  Home Medications   Current Outpatient Rx  Name Route Sig Dispense Refill  . BIOTIN 5000 MCG PO CAPS Oral Take 1 capsule by mouth daily.    Marland Kitchen VITAMIN D 2000 UNITS PO TABS Oral Take 3,000 Units by mouth daily.     Marland Kitchen VITAMIN B-12 CR PO Oral Take by mouth.    . DIOVAN HCT 160-12.5 MG PO TABS Oral Take 1 tablet by mouth  daily. 30 tablet 5    Dispense as written.  Marland Kitchen HYDROCODONE-ACETAMINOPHEN 5-500 MG PO TABS Oral Take 1-2 tablets by mouth every 6 (six) hours as needed for pain. 15 tablet 0  . MELOXICAM 15 MG PO TABS Oral Take 1 tablet (15 mg total) by mouth daily. 90 tablet 1    BP 145/68  Pulse 86  Temp(Src) 97.8 F (36.6 C) (Oral)  Resp 20  SpO2 98%  Physical Exam  Nursing note and vitals reviewed. Constitutional: She is oriented to person, place, and time. She appears well-developed and well-nourished. No distress.  HENT:  Head: Normocephalic and atraumatic.  Eyes: EOM are normal.  Neck: Neck supple.  Cardiovascular: Normal rate.   Pulmonary/Chest: Effort normal.  Abdominal:       Obese  Musculoskeletal: Normal range of motion. She exhibits no edema.       Left lower extremity Minimally tender anterior knee, no effusion no ligamentous laxity no redness no warmth neurovascularly intact. All other extremities without redness or deformity or tenderness neurovascularly intact  Neurological: She is alert and oriented to person, place, and  time.        Gait normal walks without limp    ED Course  Procedures (including critical care time)  Labs Reviewed - No data to display No results found.   No diagnosis found.    MDM  ED chart from 07/07/2011 reviewed. Patient declines further pain medicine Plan followup Dr.Ramos if unable to return to work full duty by 07/19/2011 Diagnosis sprain of left knee        Doug Sou, MD 07/14/11 1053

## 2011-07-14 NOTE — Discharge Instructions (Signed)
Knee Pain The knee is the complex joint between your thigh and your lower leg. It is made up of bones, tendons, ligaments, and cartilage. The bones that make up the knee are:  The femur in the thigh.   The tibia and fibula in the lower leg.   The patella or kneecap riding in the groove on the lower femur.  CAUSES  Knee pain is a common complaint with many causes. A few of these causes are:  Injury, such as:   A ruptured ligament or tendon injury.   Torn cartilage.   Medical conditions, such as:   Gout   Arthritis   Infections   Overuse, over training or overdoing a physical activity.  Knee pain can be minor or severe. Knee pain can accompany debilitating injury. Minor knee problems often respond well to self-care measures or get well on their own. More serious injuries may need medical intervention or even surgery. SYMPTOMS The knee is complex. Symptoms of knee problems can vary widely. Some of the problems are:  Pain with movement and weight bearing.   Swelling and tenderness.   Buckling of the knee.   Inability to straighten or extend your knee.   Your knee locks and you cannot straighten it.   Warmth and redness with pain and fever.   Deformity or dislocation of the kneecap.  DIAGNOSIS  Determining what is wrong may be very straight forward such as when there is an injury. It can also be challenging because of the complexity of the knee. Tests to make a diagnosis may include:  Your caregiver taking a history and doing a physical exam.   Routine X-rays can be used to rule out other problems. X-rays will not reveal a cartilage tear. Some injuries of the knee can be diagnosed by:   Arthroscopy a surgical technique by which a small video camera is inserted through tiny incisions on the sides of the knee. This procedure is used to examine and repair internal knee joint problems. Tiny instruments can be used during arthroscopy to repair the torn knee cartilage  (meniscus).   Arthrography is a radiology technique. A contrast liquid is directly injected into the knee joint. Internal structures of the knee joint then become visible on X-ray film.   An MRI scan is a non x-ray radiology procedure in which magnetic fields and a computer produce two- or three-dimensional images of the inside of the knee. Cartilage tears are often visible using an MRI scanner. MRI scans have largely replaced arthrography in diagnosing cartilage tears of the knee.   Blood work.   Examination of the fluid that helps to lubricate the knee joint (synovial fluid). This is done by taking a sample out using a needle and a syringe.  TREATMENT The treatment of knee problems depends on the cause. Some of these treatments are:  Depending on the injury, proper casting, splinting, surgery or physical therapy care will be needed.   Give yourself adequate recovery time. Do not overuse your joints. If you begin to get sore during workout routines, back off. Slow down or do fewer repetitions.   For repetitive activities such as cycling or running, maintain your strength and nutrition.   Alternate muscle groups. For example if you are a weight lifter, work the upper body on one day and the lower body the next.   Either tight or weak muscles do not give the proper support for your knee. Tight or weak muscles do not absorb the stress placed  on the knee joint. Keep the muscles surrounding the knee strong.   Take care of mechanical problems.   If you have flat feet, orthotics or special shoes may help. See your caregiver if you need help.   Arch supports, sometimes with wedges on the inner or outer aspect of the heel, can help. These can shift pressure away from the side of the knee most bothered by osteoarthritis.   A brace called an "unloader" brace also may be used to help ease the pressure on the most arthritic side of the knee.   If your caregiver has prescribed crutches, braces,  wraps or ice, use as directed. The acronym for this is PRICE. This means protection, rest, ice, compression and elevation.   Nonsteroidal anti-inflammatory drugs (NSAID's), can help relieve pain. But if taken immediately after an injury, they may actually increase swelling. Take NSAID's with food in your stomach. Stop them if you develop stomach problems. Do not take these if you have a history of ulcers, stomach pain or bleeding from the bowel. Do not take without your caregiver's approval if you have problems with fluid retention, heart failure, or kidney problems.   For ongoing knee problems, physical therapy may be helpful.   Glucosamine and chondroitin are over-the-counter dietary supplements. Both may help relieve the pain of osteoarthritis in the knee. These medicines are different from the usual anti-inflammatory drugs. Glucosamine may decrease the rate of cartilage destruction.   Injections of a corticosteroid drug into your knee joint may help reduce the symptoms of an arthritis flare-up. They may provide pain relief that lasts a few months. You may have to wait a few months between injections. The injections do have a small increased risk of infection, water retention and elevated blood sugar levels.   Hyaluronic acid injected into damaged joints may ease pain and provide lubrication. These injections may work by reducing inflammation. A series of shots may give relief for as long as 6 months.   Topical painkillers. Applying certain ointments to your skin may help relieve the pain and stiffness of osteoarthritis. Ask your pharmacist for suggestions. Many over the-counter products are approved for temporary relief of arthritis pain.   In some countries, doctors often prescribe topical NSAID's for relief of chronic conditions such as arthritis and tendinitis. A review of treatment with NSAID creams found that they worked as well as oral medications but without the serious side effects.    PREVENTION  Maintain a healthy weight. Extra pounds put more strain on your joints.   Get strong, stay limber. Weak muscles are a common cause of knee injuries. Stretching is important. Include flexibility exercises in your workouts.   Be smart about exercise. If you have osteoarthritis, chronic knee pain or recurring injuries, you may need to change the way you exercise. This does not mean you have to stop being active. If your knees ache after jogging or playing basketball, consider switching to swimming, water aerobics or other low-impact activities, at least for a few days a week. Sometimes limiting high-impact activities will provide relief.   Make sure your shoes fit well. Choose footwear that is right for your sport.   Protect your knees. Use the proper gear for knee-sensitive activities. Use kneepads when playing volleyball or laying carpet. Buckle your seat belt every time you drive. Most shattered kneecaps occur in car accidents.   Rest when you are tired.  SEEK MEDICAL CARE IF:  You have knee pain that is continual and does not  seem to be getting better.  SEEK IMMEDIATE MEDICAL CARE IF:  Your knee joint feels hot to the touch and you have a high fever. MAKE SURE YOU:   Understand these instructions.   Will watch your condition.   Will get help right away if you are not doing well or get worse.  Document Released: 02/14/2007 Document Revised: 04/08/2011 Document Reviewed: 02/14/2007 [facility] Patient Information 2012 [place], [place].  See Dr. Ethelene Hal or your workers comp clinic if unable to work full duty by 07/19/11

## 2011-07-29 ENCOUNTER — Ambulatory Visit (INDEPENDENT_AMBULATORY_CARE_PROVIDER_SITE_OTHER): Payer: BC Managed Care – PPO | Admitting: Family Medicine

## 2011-07-29 ENCOUNTER — Encounter: Payer: Self-pay | Admitting: Family Medicine

## 2011-07-29 VITALS — BP 114/72 | HR 76 | Temp 98.2°F | Wt 264.6 lb

## 2011-07-29 DIAGNOSIS — M7710 Lateral epicondylitis, unspecified elbow: Secondary | ICD-10-CM

## 2011-07-29 MED ORDER — PREDNISONE 10 MG PO TABS: ORAL_TABLET | ORAL | Status: DC

## 2011-07-29 NOTE — Progress Notes (Signed)
  Subjective:    Elizabeth [name] is a 61 y.o. female who presents with right elbow pain. Onset of the symptoms was several months ago. 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 Current symptoms include: point tenderness lat mall--- some pain in forearm and upper arm on occassion. Pain is aggravated by: supination/pronation as when opening doors, stirring. Symptoms have progressed to a point and plateaued. Patient has had no prior elbow problems. Evaluation to date: none. Treatment to date: nothing specific.  The following portions of the patient's history were reviewed and updated as appropriate: allergies, current medications, past family history, past medical history, past social history, past surgical history and problem list.  Review of Systems Pertinent items are noted in HPI.   Objective:    BP 114/72  Pulse 76  Temp(Src) 98.2 F (36.8 C) (Oral)  Wt 264 lb 9.6 oz (120.022 kg)  SpO2 94% Right elbow: without deformity, full active ROM and tenderness over lateral epicondyle  Left elbow:  without deformity and full active ROM   X-ray right elbow: not indicated   Assessment:    right elbow tendonitis    Plan:    Natural history and expected course discussed. Questions answered. Rest, ice, compression, and elevation (RICE) therapy. Reduction in offending activity. Tennis elbow splint. refer to ortho if no better

## 2011-07-29 NOTE — Patient Instructions (Signed)
Tennis Elbow  Your caregiver has diagnosed you with a condition often referred to as "tennis elbow." This results from small tears or soreness (inflammation) at the start (origin) of the extensor muscles of the forearm. Although the condition is often called tennis or golfer's elbow, it is caused by any repetitive action performed by your elbow.  HOME CARE INSTRUCTIONS   If the condition has been short lived, rest may be the only treatment required. Using your opposite hand or arm to perform the task may help. Even changing your grip may help rest the extremity. These may even prevent the condition from recurring.   Longer standing problems, however, will often be relieved faster by:   Using anti-inflammatory agents.   Applying ice packs for 30 minutes at the end of the working day, at bed time, or when activities are finished.   Your caregiver may also have you wear a splint or sling. This will allow the inflamed tendon to heal.  At times, steroid injections aided with a local anesthetic will be required along with splinting for 1 to 2 weeks. Two to three steroid injections will often solve the problem. In some long standing cases, the inflamed tendon does not respond to conservative (non-surgical) therapy. Then surgery may be required to repair it.  MAKE SURE YOU:    Understand these instructions.   Will watch your condition.   Will get help right away if you are not doing well or get worse.  Document Released: 04/19/2005 Document Revised: 04/08/2011 Document Reviewed: 12/06/2007  [employer] Patient Information 2012 [employer].

## 2011-09-13 ENCOUNTER — Telehealth: Payer: Self-pay | Admitting: Internal Medicine

## 2011-09-13 NOTE — Telephone Encounter (Signed)
oved 6/13 appt to 6/17 - MM out. S/w pt and also confirmed 6/12 lb/ct.

## 2011-10-11 ENCOUNTER — Encounter: Payer: Self-pay | Admitting: Family Medicine

## 2011-10-11 ENCOUNTER — Ambulatory Visit (INDEPENDENT_AMBULATORY_CARE_PROVIDER_SITE_OTHER): Payer: BC Managed Care – PPO | Admitting: Family Medicine

## 2011-10-11 VITALS — BP 122/70 | HR 76 | Temp 98.3°F | Ht 63.5 in | Wt 271.0 lb

## 2011-10-11 DIAGNOSIS — Z23 Encounter for immunization: Secondary | ICD-10-CM

## 2011-10-11 DIAGNOSIS — M1990 Unspecified osteoarthritis, unspecified site: Secondary | ICD-10-CM

## 2011-10-11 DIAGNOSIS — I10 Essential (primary) hypertension: Secondary | ICD-10-CM

## 2011-10-11 DIAGNOSIS — Z0000 Encounter for general adult medical examination without abnormal findings: Secondary | ICD-10-CM

## 2011-10-11 DIAGNOSIS — E785 Hyperlipidemia, unspecified: Secondary | ICD-10-CM

## 2011-10-11 DIAGNOSIS — M25559 Pain in unspecified hip: Secondary | ICD-10-CM

## 2011-10-11 LAB — CBC WITH DIFFERENTIAL/PLATELET
Basophils Absolute: 0 10*3/uL (ref 0.0–0.1)
HCT: 40.9 % (ref 36.0–46.0)
Lymphs Abs: 2 10*3/uL (ref 0.7–4.0)
Monocytes Absolute: 0.5 10*3/uL (ref 0.1–1.0)
Monocytes Relative: 5.8 % (ref 3.0–12.0)
Neutrophils Relative %: 65.9 % (ref 43.0–77.0)
Platelets: 195 10*3/uL (ref 150.0–400.0)
RDW: 13.4 % (ref 11.5–14.6)
WBC: 7.9 10*3/uL (ref 4.5–10.5)

## 2011-10-11 LAB — LIPID PANEL
Cholesterol: 173 mg/dL (ref 0–200)
LDL Cholesterol: 101 mg/dL — ABNORMAL HIGH (ref 0–99)
Total CHOL/HDL Ratio: 4
Triglycerides: 142 mg/dL (ref 0.0–149.0)
VLDL: 28.4 mg/dL (ref 0.0–40.0)

## 2011-10-11 LAB — BASIC METABOLIC PANEL
BUN: 18 mg/dL (ref 6–23)
Creatinine, Ser: 0.9 mg/dL (ref 0.4–1.2)
GFR: 68.31 mL/min (ref >60.00)
Glucose, Bld: 96 mg/dL (ref 70–99)
Potassium: 4 mEq/L (ref 3.5–5.1)

## 2011-10-11 LAB — POCT URINALYSIS DIPSTICK
Blood, UA: NEGATIVE
Nitrite, UA: NEGATIVE
Protein, UA: NEGATIVE
Spec Grav, UA: <=1.005
Urobilinogen, UA: 0.2

## 2011-10-11 LAB — HEPATIC FUNCTION PANEL
AST: 25 U/L (ref 0–37)
Bilirubin, Direct: 0 mg/dL (ref 0.0–0.3)
Total Bilirubin: 0.5 mg/dL (ref 0.3–1.2)

## 2011-10-11 MED ORDER — MELOXICAM 7.5 MG PO TABS: 7.5000 mg | ORAL_TABLET | Freq: Every day | ORAL | Status: DC

## 2011-10-11 MED ORDER — DIOVAN HCT 160-12.5 MG PO TABS: 1.0000 | ORAL_TABLET | Freq: Every day | ORAL | Status: DC

## 2011-10-11 MED ORDER — DIOVAN HCT 160-12.5 MG PO TABS
1.0000 | ORAL_TABLET | Freq: Every day | ORAL | Status: DC
Start: 2011-10-11 — End: 2012-06-12

## 2011-10-11 NOTE — Patient Instructions (Signed)
Preventive Care for Adults, Female A healthy lifestyle and preventive care can promote health and wellness. Preventive health guidelines for women include the following key practices.  A routine yearly physical is a good way to check with your caregiver about your health and preventive screening. It is a chance to share any concerns and updates on your health, and to receive a thorough exam.   Visit your dentist for a routine exam and preventive care every 6 months. Brush your teeth twice a day and floss once a day. Good oral hygiene prevents tooth decay and gum disease.   The frequency of eye exams is based on your age, health, family medical history, use of contact lenses, and other factors. Follow your caregiver's recommendations for frequency of eye exams.   Eat a healthy diet. Foods like vegetables, fruits, whole grains, low-fat dairy products, and lean protein foods contain the nutrients you need without too many calories. Decrease your intake of foods high in solid fats, added sugars, and salt. Eat the right amount of calories for you.Get information about a proper diet from your caregiver, if necessary.   Regular physical exercise is one of the most important things you can do for your health. Most adults should get at least 150 minutes of moderate-intensity exercise (any activity that increases your heart rate and causes you to sweat) each week. In addition, most adults need muscle-strengthening exercises on 2 or more days a week.   Maintain a healthy weight. The body mass index (BMI) is a screening tool to identify possible weight problems. It provides an estimate of body fat based on height and weight. Your caregiver can help determine your BMI, and can help you achieve or maintain a healthy weight.For adults 20 years and older:   A BMI below 18.5 is considered underweight.   A BMI of 18.5 to 24.9 is normal.   A BMI of 25 to 29.9 is considered overweight.   A BMI of 30 and above is  considered obese.   Maintain normal blood lipids and cholesterol levels by exercising and minimizing your intake of saturated fat. Eat a balanced diet with plenty of fruit and vegetables. Blood tests for lipids and cholesterol should begin at age 20 and be repeated every 5 years. If your lipid or cholesterol levels are high, you are over 50, or you are at high risk for heart disease, you may need your cholesterol levels checked more frequently.Ongoing high lipid and cholesterol levels should be treated with medicines if diet and exercise are not effective.   If you smoke, find out from your caregiver how to quit. If you do not use tobacco, do not start.   If you are pregnant, do not drink alcohol. If you are breastfeeding, be very cautious about drinking alcohol. If you are not pregnant and choose to drink alcohol, do not exceed 1 drink per day. One drink is considered to be 12 ounces (355 mL) of beer, 5 ounces (148 mL) of wine, or 1.5 ounces (44 mL) of liquor.   Avoid use of street drugs. Do not share needles with anyone. Ask for help if you need support or instructions about stopping the use of drugs.   High blood pressure causes heart disease and increases the risk of stroke. Your blood pressure should be checked at least every 1 to 2 years. Ongoing high blood pressure should be treated with medicines if weight loss and exercise are not effective.   If you are 55 to 62   years old, ask your caregiver if you should take aspirin to prevent strokes.   Diabetes screening involves taking a blood sample to check your fasting blood sugar level. This should be done once every 3 years, after age 45, if you are within normal weight and without risk factors for diabetes. Testing should be considered at a younger age or be carried out more frequently if you are overweight and have at least 1 risk factor for diabetes.   Breast cancer screening is essential preventive care for women. You should practice "breast  self-awareness." This means understanding the normal appearance and feel of your breasts and may include breast self-examination. Any changes detected, no matter how small, should be reported to a caregiver. Women in their 20s and 30s should have a clinical breast exam (CBE) by a caregiver as part of a regular health exam every 1 to 3 years. After age 40, women should have a CBE every year. Starting at age 40, women should consider having a mammography (breast X-ray test) every year. Women who have a family history of breast cancer should talk to their caregiver about genetic screening. Women at a high risk of breast cancer should talk to their caregivers about having magnetic resonance imaging (MRI) and a mammography every year.   The Pap test is a screening test for cervical cancer. A Pap test can show cell changes on the cervix that might become cervical cancer if left untreated. A Pap test is a procedure in which cells are obtained and examined from the lower end of the uterus (cervix).   Women should have a Pap test starting at age 21.   Between ages 21 and 29, Pap tests should be repeated every 2 years.   Beginning at age 30, you should have a Pap test every 3 years as long as the past 3 Pap tests have been normal.   Some women have medical problems that increase the chance of getting cervical cancer. Talk to your caregiver about these problems. It is especially important to talk to your caregiver if a new problem develops soon after your last Pap test. In these cases, your caregiver may recommend more frequent screening and Pap tests.   The above recommendations are the same for women who have or have not gotten the vaccine for human papillomavirus (HPV).   If you had a hysterectomy for a problem that was not cancer or a condition that could lead to cancer, then you no longer need Pap tests. Even if you no longer need a Pap test, a regular exam is a good idea to make sure no other problems are  starting.   If you are between ages 65 and 70, and you have had normal Pap tests going back 10 years, you no longer need Pap tests. Even if you no longer need a Pap test, a regular exam is a good idea to make sure no other problems are starting.   If you have had past treatment for cervical cancer or a condition that could lead to cancer, you need Pap tests and screening for cancer for at least 20 years after your treatment.   If Pap tests have been discontinued, risk factors (such as a new sexual partner) need to be reassessed to determine if screening should be resumed.   The HPV test is an additional test that may be used for cervical cancer screening. The HPV test looks for the virus that can cause the cell changes on the cervix.   The cells collected during the Pap test can be tested for HPV. The HPV test could be used to screen women aged 30 years and older, and should be used in women of any age who have unclear Pap test results. After the age of 30, women should have HPV testing at the same frequency as a Pap test.   Colorectal cancer can be detected and often prevented. Most routine colorectal cancer screening begins at the age of 50 and continues through age 75. However, your caregiver may recommend screening at an earlier age if you have risk factors for colon cancer. On a yearly basis, your caregiver may provide home test kits to check for hidden blood in the stool. Use of a small camera at the end of a tube, to directly examine the colon (sigmoidoscopy or colonoscopy), can detect the earliest forms of colorectal cancer. Talk to your caregiver about this at age 50, when routine screening begins. Direct examination of the colon should be repeated every 5 to 10 years through age 75, unless early forms of pre-cancerous polyps or small growths are found.   Hepatitis C blood testing is recommended for all people born from 1945 through 1965 and any individual with known risks for hepatitis C.    Practice safe sex. Use condoms and avoid high-risk sexual practices to reduce the spread of sexually transmitted infections (STIs). STIs include gonorrhea, chlamydia, syphilis, trichomonas, herpes, HPV, and human immunodeficiency virus (HIV). Herpes, HIV, and HPV are viral illnesses that have no cure. They can result in disability, cancer, and death. Sexually active women aged 25 and younger should be checked for chlamydia. Older women with new or multiple partners should also be tested for chlamydia. Testing for other STIs is recommended if you are sexually active and at increased risk.   Osteoporosis is a disease in which the bones lose minerals and strength with aging. This can result in serious bone fractures. The risk of osteoporosis can be identified using a bone density scan. Women ages 65 and over and women at risk for fractures or osteoporosis should discuss screening with their caregivers. Ask your caregiver whether you should take a calcium supplement or vitamin D to reduce the rate of osteoporosis.   Menopause can be associated with physical symptoms and risks. Hormone replacement therapy is available to decrease symptoms and risks. You should talk to your caregiver about whether hormone replacement therapy is right for you.   Use sunscreen with sun protection factor (SPF) of 30 or more. Apply sunscreen liberally and repeatedly throughout the day. You should seek shade when your shadow is shorter than you. Protect yourself by wearing long sleeves, pants, a wide-brimmed hat, and sunglasses year round, whenever you are outdoors.   Once a month, do a whole body skin exam, using a mirror to look at the skin on your back. Notify your caregiver of new moles, moles that have irregular borders, moles that are larger than a pencil eraser, or moles that have changed in shape or color.   Stay current with required immunizations.   Influenza. You need a dose every fall (or winter). The composition of  the flu vaccine changes each year, so being vaccinated once is not enough.   Pneumococcal polysaccharide. You need 1 to 2 doses if you smoke cigarettes or if you have certain chronic medical conditions. You need 1 dose at age 65 (or older) if you have never been vaccinated.   Tetanus, diphtheria, pertussis (Tdap, Td). Get 1 dose of   Tdap vaccine if you are younger than age 65, are over 65 and have contact with an infant, are a healthcare worker, are pregnant, or simply want to be protected from whooping cough. After that, you need a Td booster dose every 10 years. Consult your caregiver if you have not had at least 3 tetanus and diphtheria-containing shots sometime in your life or have a deep or dirty wound.   HPV. You need this vaccine if you are a woman age 26 or younger. The vaccine is given in 3 doses over 6 months.   Measles, mumps, rubella (MMR). You need at least 1 dose of MMR if you were born in 1957 or later. You may also need a second dose.   Meningococcal. If you are age 19 to 21 and a first-year college student living in a residence hall, or have one of several medical conditions, you need to get vaccinated against meningococcal disease. You may also need additional booster doses.   Zoster (shingles). If you are age 60 or older, you should get this vaccine.   Varicella (chickenpox). If you have never had chickenpox or you were vaccinated but received only 1 dose, talk to your caregiver to find out if you need this vaccine.   Hepatitis A. You need this vaccine if you have a specific risk factor for hepatitis A virus infection or you simply wish to be protected from this disease. The vaccine is usually given as 2 doses, 6 to 18 months apart.   Hepatitis B. You need this vaccine if you have a specific risk factor for hepatitis B virus infection or you simply wish to be protected from this disease. The vaccine is given in 3 doses, usually over 6 months.  Preventive Services /  Frequency Ages 19 to 39  Blood pressure check.** / Every 1 to 2 years.   Lipid and cholesterol check.** / Every 5 years beginning at age 20.   Clinical breast exam.** / Every 3 years for women in their 20s and 30s.   Pap test.** / Every 2 years from ages 21 through 29. Every 3 years starting at age 30 through age 65 or 70 with a history of 3 consecutive normal Pap tests.   HPV screening.** / Every 3 years from ages 30 through ages 65 to 70 with a history of 3 consecutive normal Pap tests.   Hepatitis C blood test.** / For any individual with known risks for hepatitis C.   Skin self-exam. / Monthly.   Influenza immunization.** / Every year.   Pneumococcal polysaccharide immunization.** / 1 to 2 doses if you smoke cigarettes or if you have certain chronic medical conditions.   Tetanus, diphtheria, pertussis (Tdap, Td) immunization. / A one-time dose of Tdap vaccine. After that, you need a Td booster dose every 10 years.   HPV immunization. / 3 doses over 6 months, if you are 26 and younger.   Measles, mumps, rubella (MMR) immunization. / You need at least 1 dose of MMR if you were born in 1957 or later. You may also need a second dose.   Meningococcal immunization. / 1 dose if you are age 19 to 21 and a first-year college student living in a residence hall, or have one of several medical conditions, you need to get vaccinated against meningococcal disease. You may also need additional booster doses.   Varicella immunization.** / Consult your caregiver.   Hepatitis A immunization.** / Consult your caregiver. 2 doses, 6 to 18 months   apart.   Hepatitis B immunization.** / Consult your caregiver. 3 doses usually over 6 months.  Ages 40 to 64  Blood pressure check.** / Every 1 to 2 years.   Lipid and cholesterol check.** / Every 5 years beginning at age 20.   Clinical breast exam.** / Every year after age 40.   Mammogram.** / Every year beginning at age 40 and continuing for as  long as you are in good health. Consult with your caregiver.   Pap test.** / Every 3 years starting at age 30 through age 65 or 70 with a history of 3 consecutive normal Pap tests.   HPV screening.** / Every 3 years from ages 30 through ages 65 to 70 with a history of 3 consecutive normal Pap tests.   Fecal occult blood test (FOBT) of stool. / Every year beginning at age 50 and continuing until age 75. You may not need to do this test if you get a colonoscopy every 10 years.   Flexible sigmoidoscopy or colonoscopy.** / Every 5 years for a flexible sigmoidoscopy or every 10 years for a colonoscopy beginning at age 50 and continuing until age 75.   Hepatitis C blood test.** / For all people born from 1945 through 1965 and any individual with known risks for hepatitis C.   Skin self-exam. / Monthly.   Influenza immunization.** / Every year.   Pneumococcal polysaccharide immunization.** / 1 to 2 doses if you smoke cigarettes or if you have certain chronic medical conditions.   Tetanus, diphtheria, pertussis (Tdap, Td) immunization.** / A one-time dose of Tdap vaccine. After that, you need a Td booster dose every 10 years.   Measles, mumps, rubella (MMR) immunization. / You need at least 1 dose of MMR if you were born in 1957 or later. You may also need a second dose.   Varicella immunization.** / Consult your caregiver.   Meningococcal immunization.** / Consult your caregiver.   Hepatitis A immunization.** / Consult your caregiver. 2 doses, 6 to 18 months apart.   Hepatitis B immunization.** / Consult your caregiver. 3 doses, usually over 6 months.  Ages 65 and over  Blood pressure check.** / Every 1 to 2 years.   Lipid and cholesterol check.** / Every 5 years beginning at age 20.   Clinical breast exam.** / Every year after age 40.   Mammogram.** / Every year beginning at age 40 and continuing for as long as you are in good health. Consult with your caregiver.   Pap test.** /  Every 3 years starting at age 30 through age 65 or 70 with a 3 consecutive normal Pap tests. Testing can be stopped between 65 and 70 with 3 consecutive normal Pap tests and no abnormal Pap or HPV tests in the past 10 years.   HPV screening.** / Every 3 years from ages 30 through ages 65 or 70 with a history of 3 consecutive normal Pap tests. Testing can be stopped between 65 and 70 with 3 consecutive normal Pap tests and no abnormal Pap or HPV tests in the past 10 years.   Fecal occult blood test (FOBT) of stool. / Every year beginning at age 50 and continuing until age 75. You may not need to do this test if you get a colonoscopy every 10 years.   Flexible sigmoidoscopy or colonoscopy.** / Every 5 years for a flexible sigmoidoscopy or every 10 years for a colonoscopy beginning at age 50 and continuing until age 75.   Hepatitis   C blood test.** / For all people born from 1945 through 1965 and any individual with known risks for hepatitis C.   Osteoporosis screening.** / A one-time screening for women ages 65 and over and women at risk for fractures or osteoporosis.   Skin self-exam. / Monthly.   Influenza immunization.** / Every year.   Pneumococcal polysaccharide immunization.** / 1 dose at age 65 (or older) if you have never been vaccinated.   Tetanus, diphtheria, pertussis (Tdap, Td) immunization. / A one-time dose of Tdap vaccine if you are over 65 and have contact with an infant, are a healthcare worker, or simply want to be protected from whooping cough. After that, you need a Td booster dose every 10 years.   Varicella immunization.** / Consult your caregiver.   Meningococcal immunization.** / Consult your caregiver.   Hepatitis A immunization.** / Consult your caregiver. 2 doses, 6 to 18 months apart.   Hepatitis B immunization.** / Check with your caregiver. 3 doses, usually over 6 months.  ** Family history and personal history of risk and conditions may change your caregiver's  recommendations. Document Released: 06/15/2001 Document Revised: 04/08/2011 Document Reviewed: 09/14/2010 [employer] Patient Information 2012 [employer]. 

## 2011-10-11 NOTE — Assessment & Plan Note (Signed)
Stable con't meds 

## 2011-10-11 NOTE — Progress Notes (Signed)
  Subjective:     Elizabeth [name] is a 61 y.o. female and is here for a comprehensive physical exam. The patient reports no problems.  History   Social History  . Marital Status: Divorced    Spouse Name: N/A    Number of Children: 1  . Years of Education: N/A   Occupational History  . School Nutritionist    Social History Main Topics  . Smoking status: Never Smoker   . Smokeless tobacco: Not on file  . Alcohol Use: No  . Drug Use: No  . Sexually Active: Yes   Other Topics Concern  . Not on file   Social History Narrative   Exercise--  no   Health Maintenance  Topic Date Due  . Zostavax  10/14/2009  . Influenza Vaccine  02/01/2012  . Mammogram  12/25/2012  . Pap Smear  12/25/2013  . Colonoscopy  06/28/2019  . Tetanus/tdap  10/10/2021    The following portions of the patient's history were reviewed and updated as appropriate: allergies, current medications, past family history, past medical history, past social history, past surgical history and problem list.  Review of Systems Review of Systems  Constitutional: Negative for activity change, appetite change and fatigue.  HENT: Negative for hearing loss, congestion, tinnitus and ear discharge.  dentist -no Eyes: Negative for visual disturbance (see optho --no) Respiratory: Negative for cough, chest tightness and shortness of breath.   Cardiovascular: Negative for chest pain, palpitations and leg swelling.  Gastrointestinal: Negative for abdominal pain, diarrhea, constipation and abdominal distention.  Genitourinary: Negative for urgency, frequency, decreased urine volume and difficulty urinating.  Musculoskeletal: Negative for back pain, arthralgias and gait problem.  Skin: Negative for color change, pallor and rash. --Derm--Dr Yetta Barre Neurological: Negative for dizziness, light-headedness, numbness and headaches.  Hematological: Negative for adenopathy. Does not bruise/bleed easily.  Psychiatric/Behavioral: Negative for  suicidal ideas, confusion, sleep disturbance, self-injury, dysphoric mood, decreased concentration and agitation.       Objective:    BP 122/70  Pulse 76  Temp(Src) 98.3 F (36.8 C) (Oral)  Ht 5' 3.5" (1.613 m)  Wt 271 lb (122.925 kg)  BMI 47.25 kg/m2  SpO2 97% General appearance: alert, cooperative, appears stated age and no distress Head: Normocephalic, without obvious abnormality, atraumatic Eyes: conjunctivae/corneas clear. PERRL, EOM's intact. Fundi benign. Ears: normal TM's and external ear canals both ears Nose: Nares normal. Septum midline. Mucosa normal. No drainage or sinus tenderness. Throat: lips, mucosa, and tongue normal; teeth and gums normal Neck: no adenopathy, no carotid bruit, no JVD, supple, symmetrical, trachea midline and thyroid not enlarged, symmetric, no tenderness/mass/nodules Back: symmetric, no curvature. ROM normal. No CVA tenderness. Lungs: clear to auscultation bilaterally Breasts: gyn Heart: regular rate and rhythm, S1, S2 normal, no murmur, click, rub or gallop Abdomen: soft, non-tender; bowel sounds normal; no masses,  no organomegaly Pelvic: gyn Extremities: extremities normal, atraumatic, no cyanosis or edema Pulses: 2+ and symmetric Skin: Skin color, texture, turgor normal. No rashes or lesions Lymph nodes: Cervical, supraclavicular, and axillary nodes normal. Neurologic: Alert and oriented X 3, normal strength and tone. Normal symmetric reflexes. Normal coordination and gait psych--  no depression/ anxiety    Assessment:    Healthy female exam.      Plan:    ghm utd Check fasting labs See After Visit Summary for Counseling Recommendations

## 2011-10-11 NOTE — Assessment & Plan Note (Signed)
Check labs 

## 2011-10-13 ENCOUNTER — Ambulatory Visit (HOSPITAL_COMMUNITY)
Admission: RE | Admit: 2011-10-13 | Discharge: 2011-10-13 | Disposition: A | Discharge status: Home / Self Care | Payer: BC Managed Care – PPO | Source: Ambulatory Visit | Attending: Internal Medicine | Admitting: Internal Medicine

## 2011-10-13 ENCOUNTER — Other Ambulatory Visit (HOSPITAL_BASED_OUTPATIENT_CLINIC_OR_DEPARTMENT_OTHER): Payer: BC Managed Care – PPO | Admitting: Lab

## 2011-10-13 DIAGNOSIS — C3490 Malignant neoplasm of unspecified part of unspecified bronchus or lung: Secondary | ICD-10-CM

## 2011-10-13 DIAGNOSIS — I2510 Atherosclerotic heart disease of native coronary artery without angina pectoris: Secondary | ICD-10-CM | POA: Insufficient documentation

## 2011-10-13 DIAGNOSIS — C3430 Malignant neoplasm of lower lobe, unspecified bronchus or lung: Secondary | ICD-10-CM

## 2011-10-13 LAB — CBC WITH DIFFERENTIAL/PLATELET
Basophils Absolute: 0 10*3/uL (ref 0.0–0.1)
EOS%: 2.3 % (ref 0.0–7.0)
HCT: 40.2 % (ref 34.8–46.6)
HGB: 13.4 g/dL (ref 11.6–15.9)
LYMPH%: 27.2 % (ref 14.0–49.7)
MCH: 31.1 pg (ref 25.1–34.0)
MCV: 92.9 fL (ref 79.5–101.0)
MONO%: 6.5 % (ref 0.0–14.0)
NEUT%: 63.6 % (ref 38.4–76.8)
Platelets: 202 10*3/uL (ref 145–400)

## 2011-10-13 LAB — CMP (CANCER CENTER ONLY)
AST: 31 U/L (ref 11–38)
BUN, Bld: 16 mg/dL (ref 7–22)
Calcium: 9.1 mg/dL (ref 8.0–10.3)
Chloride: 101 mEq/L (ref 98–108)
Creat: 1 mg/dl (ref 0.6–1.2)
Total Bilirubin: 0.6 mg/dl (ref 0.20–1.60)

## 2011-10-13 MED ORDER — IOHEXOL 300 MG/ML  SOLN
100.0000 mL | Freq: Once | INTRAMUSCULAR | Status: AC | PRN
  Administered 2011-10-13: 100 mL via INTRAVENOUS

## 2011-10-14 ENCOUNTER — Ambulatory Visit: Payer: BC Managed Care – PPO | Admitting: Internal Medicine

## 2011-10-18 ENCOUNTER — Telehealth: Payer: Self-pay | Admitting: Internal Medicine

## 2011-10-18 ENCOUNTER — Ambulatory Visit (HOSPITAL_BASED_OUTPATIENT_CLINIC_OR_DEPARTMENT_OTHER): Payer: BC Managed Care – PPO | Admitting: Internal Medicine

## 2011-10-18 VITALS — BP 141/66 | HR 81 | Temp 98.6°F | Ht 63.5 in | Wt 272.9 lb

## 2011-10-18 DIAGNOSIS — C3410 Malignant neoplasm of upper lobe, unspecified bronchus or lung: Secondary | ICD-10-CM

## 2011-10-18 DIAGNOSIS — C3490 Malignant neoplasm of unspecified part of unspecified bronchus or lung: Secondary | ICD-10-CM

## 2011-10-18 DIAGNOSIS — C3411 Malignant neoplasm of upper lobe, right bronchus or lung: Secondary | ICD-10-CM | POA: Insufficient documentation

## 2011-10-18 NOTE — Telephone Encounter (Signed)
appts made and printed for pt aom °

## 2011-10-18 NOTE — Progress Notes (Signed)
[facility] [phone]   [phone]  OFFICE PROGRESS NOTE  Loreen Freud, DO [phone] [facility] [address] [place] [address]  DIAGNOSIS: Stage IA (T1a, NX, MX) non-small cell lung cancer, squamous cell carcinoma diagnosed in May 2010.   PRIOR THERAPY: Status post right upper lobe superior segmentectomy under the care of Dr. Edwyna Shell on 10/18/2008.   CURRENT THERAPY: Observation.  INTERVAL HISTORY: Elizabeth [name] 61 y.o. female returns to the clinic today for routine six-month followup visit. The patient is doing fine with no specific complaints today. He denied having any significant chest pain or shortness breath, no cough or hemoptysis. He has no weight loss or night sweats. She has repeat CT scan of the chest with contrast performed recently and she is here today for evaluation and discussion of her scan results.  MEDICAL HISTORY: Past Medical History  Diagnosis Date  . Cancer     lung ca  . Hyperlipidemia   . Hypertension   . Low back pain   . Vitamin d deficiency   . Lung cancer     ALLERGIES:  is allergic to penicillins.  MEDICATIONS:  Current Outpatient Prescriptions  Medication Sig Dispense Refill  . Biotin 5000 MCG CAPS Take 1 capsule by mouth daily.      . Cholecalciferol (VITAMIN D) 2000 UNITS tablet Take 3,000 Units by mouth daily.       . Cyanocobalamin (VITAMIN B-12 CR PO) Take by mouth.      . DIOVAN HCT 160-12.5 MG per tablet Take 1 tablet by mouth daily.  30 tablet  5  . meloxicam (MOBIC) 7.5 MG tablet Take 1 tablet (7.5 mg total) by mouth daily.  30 tablet  11  . quiNINE (QUALAQUIN) 324 MG capsule Take 648 mg by mouth as needed. Brand name: legcramps        SURGICAL HISTORY:  Past Surgical History  Procedure Date  . Cholecystectomy   . Tubal ligation   . Lung removal, partial     REVIEW OF SYSTEMS:  A comprehensive review of systems was negative.   PHYSICAL EXAMINATION: General  appearance: alert, cooperative and no distress Head: Normocephalic, without obvious abnormality, atraumatic Neck: no adenopathy Lymph nodes: Cervical, supraclavicular, and axillary nodes normal. Resp: clear to auscultation bilaterally Cardio: regular rate and rhythm, S1, S2 normal, no murmur, click, rub or gallop GI: soft, non-tender; bowel sounds normal; no masses,  no organomegaly Extremities: extremities normal, atraumatic, no cyanosis or edema Neurologic: Alert and oriented X 3, normal strength and tone. Normal symmetric reflexes. Normal coordination and gait  ECOG PERFORMANCE STATUS: 1 - Symptomatic but completely ambulatory  Blood pressure 141/66, pulse 81, temperature 98.6 F (37 C), temperature source Oral, height 5' 3.5" (1.613 m), weight 272 lb 14.4 oz (123.787 kg).  LABORATORY DATA: Lab Results  Component Value Date   WBC 7.6 10/13/2011   HGB 13.4 10/13/2011   HCT 40.2 10/13/2011   MCV 92.9 10/13/2011   PLT 202 10/13/2011      Chemistry      Component Value Date/Time   NA 144 10/13/2011 1204   NA 142 10/11/2011 1022   K 5.1* 10/13/2011 1204   K 4.0 10/11/2011 1022   CL 101 10/13/2011 1204   CL 103 10/11/2011 1022   CO2 33 10/13/2011 1204   CO2 32 10/11/2011 1022   BUN 16 10/13/2011 1204   BUN 18 10/11/2011 1022   CREATININE 1.0 10/13/2011 1204  CREATININE 0.9 10/11/2011 1022      Component Value Date/Time   CALCIUM 9.1 10/13/2011 1204   CALCIUM 9.1 10/11/2011 1022   ALKPHOS 71 10/13/2011 1204   ALKPHOS 63 10/11/2011 1022   AST 31 10/13/2011 1204   AST 25 10/11/2011 1022   ALT 24 10/11/2011 1022   BILITOT 0.60 10/13/2011 1204   BILITOT 0.5 10/11/2011 1022       RADIOGRAPHIC STUDIES: Ct Chest W Contrast  10/13/2011  *RADIOLOGY REPORT*  Clinical Data: Lung cancer with right lower lobectomy.  CT CHEST WITH CONTRAST  Technique:  Multidetector CT imaging of the chest was performed following the standard protocol during bolus administration of intravenous contrast.  Contrast:  OMNIPAQUE IOHEXOL 300 MG/ML  SOLN  Comparison: 04/26/2011  Findings: No axillary, mediastinal, or hilar lymphadenopathy. Postsurgical changes in the right hilum are stable.  10 mm short- axis right hilar lymph node measured on the previous study is now 8 mm in short axis.  Heart size is normal. Coronary artery calcification is noted.  No pericardial or pleural effusion.  Surgical suture line with associated scarring again noted in the right lower lobe.  No focal airspace consolidation.  No worrisome lung nodule or mass.  Bone windows reveal no worrisome lytic or sclerotic osseous lesions.  IMPRESSION: Stable exam.  No evidence for new or progressive disease.  Original Report Authenticated By: ERIC A. MANSELL, M.D.    ASSESSMENT: This is a very pleasant 61 years old white female with history of stage IA non-small cell lung cancer status post right upper lobe superior segmentectomy and she has been observation since June of 2000 and with no evidence for disease recurrence.  PLAN: I discussed the scan results with the patient and recommended for her continuous observation for now with repeat CT scan of the chest in one year. She was advised to call me immediately if she has any concerning symptoms in the interval.  All questions were answered. The patient knows to call the clinic with any problems, questions or concerns. We can certainly see the patient much sooner if necessary.

## 2011-10-25 ENCOUNTER — Ambulatory Visit (INDEPENDENT_AMBULATORY_CARE_PROVIDER_SITE_OTHER): Payer: BC Managed Care – PPO

## 2011-10-25 DIAGNOSIS — Z2911 Encounter for prophylactic immunotherapy for respiratory syncytial virus (RSV): Secondary | ICD-10-CM

## 2011-10-25 DIAGNOSIS — Z23 Encounter for immunization: Secondary | ICD-10-CM

## 2011-12-23 ENCOUNTER — Other Ambulatory Visit: Payer: Self-pay | Admitting: Family Medicine

## 2011-12-23 DIAGNOSIS — M1990 Unspecified osteoarthritis, unspecified site: Secondary | ICD-10-CM

## 2011-12-23 MED ORDER — MELOXICAM 7.5 MG PO TABS: 7.5000 mg | ORAL_TABLET | Freq: Every day | ORAL | Status: DC

## 2011-12-23 NOTE — Telephone Encounter (Signed)
MELOXICAM 15 MG TABLET QTY:90 LAST REFILL: 09/23/11 TAKE 1 TABLET (15MG  TOTAL) BY MOUTH DAILY

## 2012-02-08 ENCOUNTER — Other Ambulatory Visit: Payer: Self-pay | Admitting: Family Medicine

## 2012-05-17 ENCOUNTER — Other Ambulatory Visit: Payer: Self-pay | Admitting: Family Medicine

## 2012-05-22 ENCOUNTER — Other Ambulatory Visit: Payer: Self-pay | Admitting: Family Medicine

## 2012-05-23 ENCOUNTER — Telehealth: Payer: Self-pay | Admitting: *Deleted

## 2012-05-23 NOTE — Telephone Encounter (Signed)
Pt left VM stating that diovan is too expensive and would like to know if there is a cheaper alternative. .Please advise

## 2012-05-23 NOTE — Telephone Encounter (Signed)
She can get it for $4 with coupon

## 2012-05-23 NOTE — Telephone Encounter (Signed)
Coupon card left at check in.       [place]

## 2012-06-01 ENCOUNTER — Ambulatory Visit: Payer: BC Managed Care – PPO | Admitting: Family Medicine

## 2012-06-08 ENCOUNTER — Ambulatory Visit: Payer: BC Managed Care – PPO | Admitting: Family Medicine

## 2012-06-12 ENCOUNTER — Ambulatory Visit (INDEPENDENT_AMBULATORY_CARE_PROVIDER_SITE_OTHER): Payer: BC Managed Care – PPO | Admitting: Family Medicine

## 2012-06-12 ENCOUNTER — Encounter: Payer: Self-pay | Admitting: Family Medicine

## 2012-06-12 VITALS — BP 120/72 | HR 82 | Temp 98.2°F | Wt 276.0 lb

## 2012-06-12 DIAGNOSIS — I10 Essential (primary) hypertension: Secondary | ICD-10-CM

## 2012-06-12 DIAGNOSIS — M1990 Unspecified osteoarthritis, unspecified site: Secondary | ICD-10-CM

## 2012-06-12 MED ORDER — DIOVAN HCT 160-12.5 MG PO TABS: ORAL_TABLET | ORAL | Status: DC

## 2012-06-12 MED ORDER — MELOXICAM 7.5 MG PO TABS: 7.5000 mg | ORAL_TABLET | Freq: Every day | ORAL | Status: DC

## 2012-06-12 NOTE — Progress Notes (Signed)
  Subjective:    Patient here for follow-up of elevated blood pressure.  She is not exercising and is adherent to a low-salt diet.  Blood pressure is well controlled at home. Cardiac symptoms: none. Patient denies: chest pain, chest pressure/discomfort, claudication, dyspnea, exertional chest pressure/discomfort, fatigue, irregular heart beat, lower extremity edema, near-syncope, orthopnea, palpitations, paroxysmal nocturnal dyspnea, syncope and tachypnea. Cardiovascular risk factors: hypertension, obesity (BMI >= 30 kg/m2) and sedentary lifestyle. Use of agents associated with hypertension: none. History of target organ damage: none.  The following portions of the patient's history were reviewed and updated as appropriate: allergies, current medications, past family history, past medical history, past social history, past surgical history and problem list.  Review of Systems Pertinent items are noted in HPI.     Objective:    BP 120/72  Pulse 82  Temp(Src) 98.2 F (36.8 C) (Oral)  Wt 276 lb (125.193 kg)  BMI 48.12 kg/m2  SpO2 96% General appearance: alert, cooperative, appears stated age and no distress Neck: no adenopathy, supple, symmetrical, trachea midline and thyroid not enlarged, symmetric, no tenderness/mass/nodules Lungs: clear to auscultation bilaterally Heart: S1, S2 normal Extremities: extremities normal, atraumatic, no cyanosis or edema    Assessment:    Hypertension, normal blood pressure . Evidence of target organ damage: none.    Plan:    Medication: no change. Dietary sodium restriction. Regular aerobic exercise. Follow up: 6 months and as needed.

## 2012-06-12 NOTE — Patient Instructions (Addendum)

## 2012-06-23 ENCOUNTER — Telehealth: Payer: Self-pay | Admitting: *Deleted

## 2012-06-23 NOTE — Telephone Encounter (Signed)
Pt Left VM that she received a no show charge.Pt notes that at the time of appt she did not call to cancel or reschedule it because she did not know about it, however she has since reschedule and had appt. Pt indicated that she has been coming here for awhile and has never not show for a appt. Pt is requesting that we waive this fee this time. .Please advise

## 2012-06-23 NOTE — Telephone Encounter (Signed)
We can waive it this time but it is common courtesy to inform us so that we can put someone else in that spot.   We started the no show fee a while ago because too many people were being told they could not come in because schedule was full.  If she had given [place] notice a sick pt could have come in her place.

## 2012-06-27 ENCOUNTER — Encounter: Payer: Self-pay | Admitting: Family Medicine

## 2012-06-27 NOTE — Telephone Encounter (Signed)
Waiver request was sent to [employer] for a one time waiver-pt was mailed a letter explaining office policy as well as waiver of this charge only

## 2012-07-31 ENCOUNTER — Encounter: Payer: Self-pay | Admitting: Family Medicine

## 2012-07-31 ENCOUNTER — Ambulatory Visit (INDEPENDENT_AMBULATORY_CARE_PROVIDER_SITE_OTHER): Payer: BC Managed Care – PPO | Admitting: Family Medicine

## 2012-07-31 VITALS — BP 130/76 | HR 75 | Temp 98.4°F | Wt 278.2 lb

## 2012-07-31 DIAGNOSIS — L089 Local infection of the skin and subcutaneous tissue, unspecified: Secondary | ICD-10-CM

## 2012-07-31 DIAGNOSIS — W57XXXA Bitten or stung by nonvenomous insect and other nonvenomous arthropods, initial encounter: Secondary | ICD-10-CM

## 2012-07-31 MED ORDER — DOXYCYCLINE HYCLATE 100 MG PO TABS: 100.0000 mg | ORAL_TABLET | Freq: Two times a day (BID) | ORAL | Status: DC

## 2012-07-31 NOTE — Assessment & Plan Note (Signed)
Doxy for 10 days Warm soaks

## 2012-07-31 NOTE — Patient Instructions (Signed)
Insect Bite Mosquitoes, flies, fleas, bedbugs, and many other insects can bite. Insect bites are different from insect stings. A sting is when venom is injected into the skin. Some insect bites can transmit infectious diseases. SYMPTOMS  Insect bites usually turn red, swell, and itch for 2 to 4 days. They often go away on their own. TREATMENT  Your caregiver may prescribe antibiotic medicines if a bacterial infection develops in the bite. HOME CARE INSTRUCTIONS  Do not scratch the bite area.  Keep the bite area clean and dry. Wash the bite area thoroughly with soap and water.  Put ice or cool compresses on the bite area.  Put ice in a plastic bag.  Place a towel between your skin and the bag.  Leave the ice on for 20 minutes, 4 times a day for the first 2 to 3 days, or as directed.  You may apply a baking soda paste, cortisone cream, or calamine lotion to the bite area as directed by your caregiver. This can help reduce itching and swelling.  Only take over-the-counter or prescription medicines as directed by your caregiver.  If you are given antibiotics, take them as directed. Finish them even if you start to feel better. You may need a tetanus shot if:  You cannot remember when you had your last tetanus shot.  You have never had a tetanus shot.  The injury broke your skin. If you get a tetanus shot, your arm may swell, get red, and feel warm to the touch. This is common and not a problem. If you need a tetanus shot and you choose not to have one, there is a rare chance of getting tetanus. Sickness from tetanus can be serious. SEEK IMMEDIATE MEDICAL CARE IF:   You have increased pain, redness, or swelling in the bite area.  You see a red line on the skin coming from the bite.  You have a fever.  You have joint pain.  You have a headache or neck pain.  You have unusual weakness.  You have a rash.  You have chest pain or shortness of breath.  You have abdominal pain,  nausea, or vomiting.  You feel unusually tired or sleepy. MAKE SURE YOU:   Understand these instructions.  Will watch your condition.  Will get help right away if you are not doing well or get worse. Document Released: 05/27/2004 Document Revised: 07/12/2011 Document Reviewed: 11/18/2010 Exit[employer] Patient Information 2013 [employer].  

## 2012-07-31 NOTE — Progress Notes (Signed)
  Subjective:    Patient ID: Elizabeth [name], female    DOB: 1950/11/04, 62 y.o.   MRN: [id number]  HPI Pt here c/o possible insect bite R ankle.  It has become red and irritated.     Review of Systems As above     Objective:   Physical Exam BP 130/76  Pulse 75  Temp(Src) 98.4 F (36.9 C) (Oral)  Wt 278 lb 3.2 oz (126.191 kg)  BMI 48.5 kg/m2  SpO2 97% General appearance: alert, cooperative, appears stated age and no distress Skin: + ? insect bite back R ankle---warm to touch and some induration with central ulceration       Assessment & Plan:

## 2012-09-05 ENCOUNTER — Telehealth: Payer: Self-pay | Admitting: Family Medicine

## 2012-09-05 ENCOUNTER — Ambulatory Visit: Payer: Self-pay | Admitting: Family Medicine

## 2012-09-05 ENCOUNTER — Encounter: Payer: Self-pay | Admitting: Family Medicine

## 2012-09-05 ENCOUNTER — Ambulatory Visit (INDEPENDENT_AMBULATORY_CARE_PROVIDER_SITE_OTHER): Payer: BC Managed Care – PPO | Admitting: Family Medicine

## 2012-09-05 VITALS — BP 132/76 | HR 64 | Temp 98.0°F | Wt 279.0 lb

## 2012-09-05 DIAGNOSIS — J40 Bronchitis, not specified as acute or chronic: Secondary | ICD-10-CM

## 2012-09-05 MED ORDER — AZITHROMYCIN 250 MG PO TABS: ORAL_TABLET | ORAL | Status: DC

## 2012-09-05 MED ORDER — GUAIFENESIN-CODEINE 100-10 MG/5ML PO SYRP: ORAL_SOLUTION | ORAL | Status: DC

## 2012-09-05 NOTE — Telephone Encounter (Signed)
Pt has a appt for 09/05/2012 11:30:00

## 2012-09-05 NOTE — Telephone Encounter (Signed)
Patient Information:  Caller Name: Imari  Phone: [phone]  Patient: Elizabeth [name], Elizabeth [name]  Gender: Female  DOB: [birth year]  Age: 62 Years  PCP: Lelon Perla.  Office Follow Up:  Does the office need to follow up with this patient?: No  Instructions For The Office: N/A   Symptoms  Reason For Call & Symptoms: Nasal congestion, scratchy/raw throat, productive cough with green/brown mucus. Patient is requesting prescription for symptoms.  Reviewed Health History In EMR: Yes  Reviewed Medications In EMR: Yes  Reviewed Allergies In EMR: Yes  Reviewed Surgeries / Procedures: Yes  Date of Onset of Symptoms: 09/02/2012  Guideline(s) Used:  Colds  Disposition Per Guideline:   See Today or Tomorrow in Office  Reason For Disposition Reached:   Patient wants to be seen  Advice Given:  N/A  Patient Will Follow Care Advice:  YES  Appointment Scheduled:  09/05/2012 11:30:00 Appointment Scheduled Provider:  Lelon Perla.

## 2012-09-05 NOTE — Patient Instructions (Signed)

## 2012-09-05 NOTE — Progress Notes (Signed)
  Subjective:     Elizabeth [name] is a 62 y.o. female who presents for evaluation of sinus pain. Symptoms include: congestion, cough, facial pain, headaches, nasal congestion and sinus pressure. Onset of symptoms was 10 days ago. Symptoms have been gradually worsening since that time. Past history is significant for no history of pneumonia or bronchitis. Patient is a former smoker.  The following portions of the patient's history were reviewed and updated as appropriate: allergies, current medications, past family history, past medical history, past social history, past surgical history and problem list.  Review of Systems Pertinent items are noted in HPI.   Objective:    BP 132/76  Pulse 64  Temp(Src) 98 F (36.7 C) (Oral)  Wt 279 lb (126.554 kg)  BMI 48.64 kg/m2  SpO2 96% General appearance: alert, cooperative, appears stated age and no distress Ears: normal TM's and external ear canals both ears Nose: green discharge, mild congestion, turbinates red, swollen Throat: lips, mucosa, and tongue normal; teeth and gums normal Neck: no adenopathy, supple, symmetrical, trachea midline and thyroid not enlarged, symmetric, no tenderness/mass/nodules Lungs: diminished breath sounds RLL Heart: S1, S2 normal    Assessment:    Acute bacterial sinusitis.    Plan:    Nasal steroids per medication orders. Antihistamines per medication orders. Azithromycin per medication orders.

## 2012-09-21 ENCOUNTER — Telehealth: Payer: Self-pay | Admitting: *Deleted

## 2012-09-21 NOTE — Telephone Encounter (Signed)
Pt called stating that her PCP auscultated her lungs when she had bronchitis and they did not hear any air movement in the RLL.  She wanted to see if she could get her CT scan done sooner.  Per Dr Donnald Garre, okay to move f/u sooner.  Left pt message with new appts.  SLJ

## 2012-10-02 ENCOUNTER — Ambulatory Visit (HOSPITAL_COMMUNITY)
Admission: RE | Admit: 2012-10-02 | Discharge: 2012-10-02 | Disposition: A | Discharge status: Home / Self Care | Payer: BC Managed Care – PPO | Source: Ambulatory Visit | Attending: Internal Medicine | Admitting: Internal Medicine

## 2012-10-02 ENCOUNTER — Other Ambulatory Visit (HOSPITAL_BASED_OUTPATIENT_CLINIC_OR_DEPARTMENT_OTHER): Payer: BC Managed Care – PPO | Admitting: Lab

## 2012-10-02 DIAGNOSIS — C3430 Malignant neoplasm of lower lobe, unspecified bronchus or lung: Secondary | ICD-10-CM

## 2012-10-02 DIAGNOSIS — C3410 Malignant neoplasm of upper lobe, unspecified bronchus or lung: Secondary | ICD-10-CM

## 2012-10-02 DIAGNOSIS — I2510 Atherosclerotic heart disease of native coronary artery without angina pectoris: Secondary | ICD-10-CM | POA: Insufficient documentation

## 2012-10-02 DIAGNOSIS — C3490 Malignant neoplasm of unspecified part of unspecified bronchus or lung: Secondary | ICD-10-CM

## 2012-10-02 DIAGNOSIS — K7689 Other specified diseases of liver: Secondary | ICD-10-CM | POA: Insufficient documentation

## 2012-10-02 LAB — COMPREHENSIVE METABOLIC PANEL (CC13)
ALT: 29 U/L (ref 0–55)
AST: 31 U/L (ref 5–34)
Albumin: 3.6 g/dL (ref 3.5–5.0)
BUN: 13 mg/dL (ref 7.0–26.0)
Calcium: 9.5 mg/dL (ref 8.4–10.4)
Chloride: 104 mEq/L (ref 98–107)
Potassium: 4.5 mEq/L (ref 3.5–5.1)

## 2012-10-02 LAB — CBC WITH DIFFERENTIAL/PLATELET
BASO%: 0.6 % (ref 0.0–2.0)
EOS%: 3.1 % (ref 0.0–7.0)
HGB: 12.7 g/dL (ref 11.6–15.9)
MCH: 30.6 pg (ref 25.1–34.0)
MCHC: 33.1 g/dL (ref 31.5–36.0)
RDW: 13.1 % (ref 11.2–14.5)
lymph#: 2.1 10*3/uL (ref 0.9–3.3)

## 2012-10-02 MED ORDER — IOHEXOL 300 MG/ML  SOLN
80.0000 mL | Freq: Once | INTRAMUSCULAR | Status: AC | PRN
  Administered 2012-10-02: 80 mL via INTRAVENOUS

## 2012-10-05 ENCOUNTER — Ambulatory Visit (HOSPITAL_BASED_OUTPATIENT_CLINIC_OR_DEPARTMENT_OTHER): Payer: BC Managed Care – PPO | Admitting: Internal Medicine

## 2012-10-05 ENCOUNTER — Other Ambulatory Visit: Payer: Self-pay | Admitting: Lab

## 2012-10-05 ENCOUNTER — Encounter: Payer: Self-pay | Admitting: Internal Medicine

## 2012-10-05 ENCOUNTER — Telehealth: Payer: Self-pay | Admitting: Internal Medicine

## 2012-10-05 VITALS — BP 163/72 | HR 75 | Temp 97.6°F | Resp 18 | Ht 63.0 in | Wt 281.2 lb

## 2012-10-05 DIAGNOSIS — C3490 Malignant neoplasm of unspecified part of unspecified bronchus or lung: Secondary | ICD-10-CM

## 2012-10-05 DIAGNOSIS — C3430 Malignant neoplasm of lower lobe, unspecified bronchus or lung: Secondary | ICD-10-CM

## 2012-10-05 NOTE — Telephone Encounter (Signed)
sw. pt and advised on June 2015 appt....pt ok and aware

## 2012-10-05 NOTE — Progress Notes (Signed)
[facility] [phone]   Fax:(336) [phone]  OFFICE PROGRESS NOTE  Loreen Freud, DO [phone] [facility] [address] [place] [address]  DIAGNOSIS: Stage IA (T1a, NX, MX) non-small cell lung cancer, squamous cell carcinoma diagnosed in May 2010.   PRIOR THERAPY: Status post right upper lobe superior segmentectomy under the care of Dr. Edwyna Shell on 10/18/2008.   CURRENT THERAPY: Observation.  INTERVAL HISTORY: Elizabeth [name] 62 y.o. female returns to the clinic today for annual followup visit. The patient is feeling fine today with no specific complaints. She denied having any significant chest pain, shortness breath, cough or hemoptysis. She has no weight loss and night sweats. The patient denied having any fever or chills. She had repeat CT scan of the chest performed recently and she is here for evaluation and discussion of her scan results.  MEDICAL HISTORY: Past Medical History  Diagnosis Date  . Hyperlipidemia   . Hypertension   . Low back pain   . Vitamin D deficiency   . Hx of cancer of lung 6/10    Removed June 2010    ALLERGIES:  is allergic to penicillins.  MEDICATIONS:  Current Outpatient Prescriptions  Medication Sig Dispense Refill  . Cyanocobalamin (VITAMIN B-12 CR PO) Take by mouth.      . DIOVAN HCT 160-12.5 MG per tablet 1 po qd  90 tablet  3  . meloxicam (MOBIC) 7.5 MG tablet Take 1 tablet (7.5 mg total) by mouth daily.  90 tablet  2  . quiNINE (QUALAQUIN) 324 MG capsule Take 648 mg by mouth as needed. Brand name: legcramps      . Cholecalciferol (VITAMIN D) 2000 UNITS tablet Take 3,000 Units by mouth daily.       Marland Kitchen triamcinolone cream (KENALOG) 0.1 % Apply 1 application topically 2 (two) times daily.       No current facility-administered medications for this visit.    SURGICAL HISTORY:  Past Surgical History  Procedure Laterality Date  . Cholecystectomy    . Tubal ligation    . Lung removal, partial        REVIEW OF SYSTEMS:  A comprehensive review of systems was negative.   PHYSICAL EXAMINATION: General appearance: alert, cooperative and no distress Head: Normocephalic, without obvious abnormality, atraumatic Neck: no adenopathy Lymph nodes: Cervical, supraclavicular, and axillary nodes normal. Resp: clear to auscultation bilaterally Cardio: regular rate and rhythm, S1, S2 normal, no murmur, click, rub or gallop GI: soft, non-tender; bowel sounds normal; no masses,  no organomegaly Extremities: extremities normal, atraumatic, no cyanosis or edema  ECOG PERFORMANCE STATUS: 0 - Asymptomatic  Blood pressure 163/72, pulse 75, temperature 97.6 F (36.4 C), temperature source Oral, resp. rate 18, height 5\' 3"  (1.6 m), weight 281 lb 3.2 oz (127.551 kg).  LABORATORY DATA: Lab Results  Component Value Date   WBC 8.3 10/02/2012   HGB 12.7 10/02/2012   HCT 38.4 10/02/2012   MCV 92.6 10/02/2012   PLT 179 10/02/2012      Chemistry      Component Value Date/Time   NA 143 10/02/2012 0949   NA 144 10/13/2011 1204   NA 142 10/11/2011 1022   K 4.5 10/02/2012 0949   K 5.1* 10/13/2011 1204   K 4.0 10/11/2011 1022   CL 104 10/02/2012 0949   CL 101 10/13/2011 1204   CL 103 10/11/2011 1022   CO2 31* 10/02/2012 0949   CO2 33 10/13/2011 1204  CO2 32 10/11/2011 1022   BUN 13.0 10/02/2012 0949   BUN 16 10/13/2011 1204   BUN 18 10/11/2011 1022   CREATININE 0.9 10/02/2012 0949   CREATININE 1.0 10/13/2011 1204   CREATININE 0.9 10/11/2011 1022      Component Value Date/Time   CALCIUM 9.5 10/02/2012 0949   CALCIUM 9.1 10/13/2011 1204   CALCIUM 9.1 10/11/2011 1022   ALKPHOS 71 10/02/2012 0949   ALKPHOS 71 10/13/2011 1204   ALKPHOS 63 10/11/2011 1022   AST 31 10/02/2012 0949   AST 31 10/13/2011 1204   AST 25 10/11/2011 1022   ALT 29 10/02/2012 0949   ALT 24 10/11/2011 1022   BILITOT 0.59 10/02/2012 0949   BILITOT 0.60 10/13/2011 1204   BILITOT 0.5 10/11/2011 1022       RADIOGRAPHIC STUDIES: Ct Chest W Contrast  10/02/2012    *RADIOLOGY REPORT*  Clinical Data: Lung cancer, restaging.  CT CHEST WITH CONTRAST  Technique:  Multidetector CT imaging of the chest was performed following the standard protocol during bolus administration of intravenous contrast.  Contrast: 80mL OMNIPAQUE IOHEXOL 300 MG/ML  SOLN  Comparison: 10/13/2011.  Findings: No pathologically enlarged mediastinal lymph nodes. Hilar lymph nodes measure up to 9 mm on the left, as before.  Right hilar lymph node measures 8 mm, stable.  No axillary adenopathy. Atherosclerotic calcification of the arterial vasculature, including coronary arteries.  Heart size normal.  No pericardial effusion.  Pre pericardiac lymph node is sub centimeter in size.  There are postoperative changes and scarring in the right hemithorax with associated volume loss and pleural thickening.  3 mm nodular density in the peripheral left upper lobe is unchanged. No pleural fluid.  Airway is otherwise unremarkable.  Incidental imaging of the upper abdomen shows low attenuation throughout the visualized portion of the liver.  No worrisome lytic or sclerotic lesions.  IMPRESSION:  1.  Postoperative changes and volume loss in the right hemithorax without evidence of recurrent or metastatic disease. 2.  Coronary artery calcification. 3.  Hepatic steatosis.   Original Report Authenticated By: Leanna Battles, M.D.    ASSESSMENT: This is a very pleasant 62 years old white female with history of stage IA non-small cell lung cancer status post right upper lobe superior segmentectomy in June of 2000 and has been observation since that time with no evidence for disease recurrence.   PLAN: I discussed the scan results with the patient today. I recommended for her to continue on observation with repeat CT scan of the chest in one year. She was advised to call immediately if she has any concerning symptoms in the interval.   All questions were answered. The patient knows to call the clinic with any problems,  questions or concerns. We can certainly see the patient much sooner if necessary.

## 2012-10-05 NOTE — Patient Instructions (Signed)
No evidence for disease recurrence on recent scan.  Followup in one year with repeat CT scan of the chest.

## 2012-10-11 ENCOUNTER — Ambulatory Visit (HOSPITAL_COMMUNITY): Payer: BC Managed Care – PPO

## 2012-10-11 ENCOUNTER — Other Ambulatory Visit: Payer: Self-pay | Admitting: Lab

## 2012-10-16 ENCOUNTER — Ambulatory Visit: Payer: Self-pay | Admitting: Internal Medicine

## 2013-03-07 ENCOUNTER — Other Ambulatory Visit: Payer: Self-pay

## 2013-03-07 DIAGNOSIS — M1990 Unspecified osteoarthritis, unspecified site: Secondary | ICD-10-CM

## 2013-03-07 MED ORDER — MELOXICAM 7.5 MG PO TABS: 7.5000 mg | ORAL_TABLET | Freq: Every day | ORAL | Status: DC

## 2013-03-07 NOTE — Telephone Encounter (Signed)
Last seen 09/05/12 and filled 06/12/12 #90 with 2 refills. Please advise      KP

## 2013-03-08 ENCOUNTER — Other Ambulatory Visit: Payer: Self-pay

## 2013-04-06 ENCOUNTER — Encounter: Payer: Self-pay | Admitting: Family Medicine

## 2013-04-06 ENCOUNTER — Ambulatory Visit (INDEPENDENT_AMBULATORY_CARE_PROVIDER_SITE_OTHER): Payer: BC Managed Care – PPO | Admitting: Family Medicine

## 2013-04-06 VITALS — BP 130/86 | HR 81 | Temp 98.1°F | Resp 16 | Wt 279.1 lb

## 2013-04-06 DIAGNOSIS — A084 Viral intestinal infection, unspecified: Secondary | ICD-10-CM

## 2013-04-06 DIAGNOSIS — A088 Other specified intestinal infections: Secondary | ICD-10-CM

## 2013-04-06 MED ORDER — ONDANSETRON 4 MG PO TBDP: 4.0000 mg | ORAL_TABLET | Freq: Three times a day (TID) | ORAL | Status: DC | PRN

## 2013-04-06 NOTE — Assessment & Plan Note (Signed)
New.  No evidence of bacterial infxn.  No abd pain on PE.  Start Zofran for nausea, immodium prn.  Reviewed supportive care and red flags that should prompt return.  Pt expressed understanding and is in agreement w/ plan.

## 2013-04-06 NOTE — Patient Instructions (Signed)
Follow up as needed This is a viral illness and will continue to improve w/ time Drink plenty of fluids- you will eat when you're ready Use the Zofran as needed for nausea- this dissolves under your tongue Immodium over the counter as needed for diarrhea REST! Hang in there! Happy Holidays!!!

## 2013-04-06 NOTE — Progress Notes (Signed)
   Subjective:    Patient ID: Elizabeth [name], female    DOB: 27-May-1950, 62 y.o.   MRN: [id number]  HPI Pre visit review using our clinic review tool, if applicable. No additional management support is needed unless otherwise documented below in the visit note.  Vomiting and diarrhea- no fever, + chills.  No sore throat.  sxs started Tuesday morning.  + sick contacts.  Able to hold down fluids but 'food- no so much'.  Is slowly improving.   Review of Systems For ROS see HPI     Objective:   Physical Exam  Vitals reviewed. Constitutional: She is oriented to person, place, and time. She appears well-developed and well-nourished. No distress.  HENT:  Head: Normocephalic and atraumatic.  MMM  Neck: Neck supple.  Cardiovascular: Normal rate, regular rhythm and intact distal pulses.   Pulmonary/Chest: Effort normal and breath sounds normal. No respiratory distress. She has no wheezes. She has no rales.  Abdominal: Soft. She exhibits no distension. There is no tenderness. There is no rebound.  Hyperactive BS  Lymphadenopathy:    She has no cervical adenopathy.  Neurological: She is alert and oriented to person, place, and time.  Skin: Skin is warm and dry.          Assessment & Plan:

## 2013-05-31 ENCOUNTER — Other Ambulatory Visit: Payer: Self-pay

## 2013-05-31 DIAGNOSIS — I10 Essential (primary) hypertension: Secondary | ICD-10-CM

## 2013-05-31 MED ORDER — DIOVAN HCT 160-12.5 MG PO TABS: ORAL_TABLET | ORAL | Status: DC

## 2013-05-31 NOTE — Telephone Encounter (Signed)
Diovan-HCT 160-12.5 mg PO Daily.  Refilled 90, 1 refill.   Last office visit 04/06/13.    Patient made aware.

## 2013-06-01 ENCOUNTER — Telehealth: Payer: Self-pay | Admitting: Family Medicine

## 2013-06-01 NOTE — Telephone Encounter (Signed)
Pt states she is on her way right now to pick up coupons for Diovan.

## 2013-06-01 NOTE — Telephone Encounter (Signed)
Pt given coupon.

## 2013-06-29 ENCOUNTER — Telehealth: Payer: Self-pay | Admitting: Medical Oncology

## 2013-06-29 NOTE — Telephone Encounter (Signed)
Requests to r/s June appt to week of June 23.Onc Tx request sent.

## 2013-07-02 ENCOUNTER — Telehealth: Payer: Self-pay | Admitting: Internal Medicine

## 2013-07-02 NOTE — Telephone Encounter (Signed)
, °

## 2013-10-02 ENCOUNTER — Other Ambulatory Visit: Payer: Self-pay

## 2013-10-02 ENCOUNTER — Other Ambulatory Visit (HOSPITAL_COMMUNITY): Payer: Self-pay

## 2013-10-04 ENCOUNTER — Ambulatory Visit: Payer: Self-pay | Admitting: Internal Medicine

## 2013-10-23 ENCOUNTER — Encounter (HOSPITAL_COMMUNITY): Payer: Self-pay

## 2013-10-23 ENCOUNTER — Other Ambulatory Visit (HOSPITAL_BASED_OUTPATIENT_CLINIC_OR_DEPARTMENT_OTHER): Payer: BC Managed Care – PPO

## 2013-10-23 ENCOUNTER — Ambulatory Visit (HOSPITAL_COMMUNITY)
Admission: RE | Admit: 2013-10-23 | Discharge: 2013-10-23 | Disposition: A | Discharge status: Home / Self Care | Payer: BC Managed Care – PPO | Source: Ambulatory Visit | Attending: Internal Medicine | Admitting: Internal Medicine

## 2013-10-23 DIAGNOSIS — C3490 Malignant neoplasm of unspecified part of unspecified bronchus or lung: Secondary | ICD-10-CM

## 2013-10-23 DIAGNOSIS — C3430 Malignant neoplasm of lower lobe, unspecified bronchus or lung: Secondary | ICD-10-CM

## 2013-10-23 LAB — COMPREHENSIVE METABOLIC PANEL (CC13)
ALK PHOS: 75 U/L (ref 40–150)
ALT: 21 U/L (ref 0–55)
AST: 23 U/L (ref 5–34)
Albumin: 3.6 g/dL (ref 3.5–5.0)
Anion Gap: 8 mEq/L (ref 3–11)
BILIRUBIN TOTAL: 0.35 mg/dL (ref 0.20–1.20)
BUN: 22.3 mg/dL (ref 7.0–26.0)
CO2: 29 mEq/L (ref 22–29)
Calcium: 9.3 mg/dL (ref 8.4–10.4)
Chloride: 106 mEq/L (ref 98–109)
Creatinine: 1 mg/dL (ref 0.6–1.1)
Glucose: 105 mg/dl (ref 70–140)
Potassium: 4.5 mEq/L (ref 3.5–5.1)
SODIUM: 143 meq/L (ref 136–145)
Total Protein: 7.5 g/dL (ref 6.4–8.3)

## 2013-10-25 ENCOUNTER — Telehealth: Payer: Self-pay | Admitting: Internal Medicine

## 2013-10-25 ENCOUNTER — Encounter: Payer: Self-pay | Admitting: Internal Medicine

## 2013-10-25 ENCOUNTER — Ambulatory Visit (HOSPITAL_BASED_OUTPATIENT_CLINIC_OR_DEPARTMENT_OTHER): Payer: BC Managed Care – PPO | Admitting: Internal Medicine

## 2013-10-25 VITALS — BP 162/70 | HR 76 | Temp 97.9°F | Resp 20 | Ht 63.0 in | Wt 289.2 lb

## 2013-10-25 DIAGNOSIS — Z85118 Personal history of other malignant neoplasm of bronchus and lung: Secondary | ICD-10-CM

## 2013-10-25 DIAGNOSIS — C3490 Malignant neoplasm of unspecified part of unspecified bronchus or lung: Secondary | ICD-10-CM

## 2013-10-25 NOTE — Progress Notes (Signed)
[facility] [phone]   [phone] OFFICE PROGRESS NOTE  Elizabeth Koyanagi, DO [phone] [place] [address]  DIAGNOSIS: Stage IA (T1a, NX, MX) non-small cell lung cancer, squamous cell carcinoma diagnosed in May 2010.   PRIOR THERAPY: Status post right upper lobe superior segmentectomy under the care of Dr. Arlyce Dice on 10/18/2008.   CURRENT THERAPY: Observation.  INTERVAL HISTORY: RENALDA LOCKLIN 63 y.o. female returns to the clinic today for annual followup visit. The patient has been on observation for the last 5 years. The patient is feeling fine today with no specific complaints. She denied having any significant chest pain, shortness breath, cough or hemoptysis. She has no weight loss and night sweats. The patient denied having any fever or chills. She had repeat CT scan of the chest performed recently and she is here for evaluation and discussion of her scan results.  MEDICAL HISTORY: Past Medical History  Diagnosis Date  . Hyperlipidemia   . Hypertension   . Low back pain   . Vitamin D deficiency   . Hx of cancer of lung 6/10    Removed June 2010    ALLERGIES:  is allergic to penicillins.  MEDICATIONS:  Current Outpatient Prescriptions  Medication Sig Dispense Refill  . Cholecalciferol (VITAMIN D) 2000 UNITS tablet Take 3,000 Units by mouth daily.       . clobetasol ointment (TEMOVATE) 0.05 %       . Cyanocobalamin (VITAMIN B-12 CR PO) Take by mouth as needed.       Marland Kitchen DIOVAN HCT 160-12.5 MG per tablet 1 po qd  90 tablet  1  . meloxicam (MOBIC) 7.5 MG tablet Take 1 tablet (7.5 mg total) by mouth daily.  90 tablet  2  . UNABLE TO FIND Brand name: Legcramps   2 tablets as needed for muscle cramps       No current facility-administered medications for this visit.    SURGICAL HISTORY:  Past Surgical History  Procedure Laterality Date  . Cholecystectomy    . Tubal ligation    . Lung removal, partial      REVIEW OF  SYSTEMS:  A comprehensive review of systems was negative.   PHYSICAL EXAMINATION: General appearance: alert, cooperative and no distress Head: Normocephalic, without obvious abnormality, atraumatic Neck: no adenopathy Lymph nodes: Cervical, supraclavicular, and axillary nodes normal. Resp: clear to auscultation bilaterally Cardio: regular rate and rhythm, S1, S2 normal, no murmur, click, rub or gallop GI: soft, non-tender; bowel sounds normal; no masses,  no organomegaly Extremities: extremities normal, atraumatic, no cyanosis or edema  ECOG PERFORMANCE STATUS: 0 - Asymptomatic  Blood pressure 162/70, pulse 76, temperature 97.9 F (36.6 C), temperature source Oral, resp. rate 20, height 5\' 3"  (1.6 m), weight 289 lb 3.2 oz (131.18 kg), SpO2 100.00%.  LABORATORY DATA: Lab Results  Component Value Date   WBC 8.3 10/02/2012   HGB 12.7 10/02/2012   HCT 38.4 10/02/2012   MCV 92.6 10/02/2012   PLT 179 10/02/2012      Chemistry      Component Value Date/Time   NA 143 10/23/2013 0843   NA 144 10/13/2011 1204   NA 142 10/11/2011 1022   K 4.5 10/23/2013 0843   K 5.1* 10/13/2011 1204   K 4.0 10/11/2011 1022   CL 104 10/02/2012 0949   CL 101 10/13/2011 1204   CL 103 10/11/2011 1022   CO2 29 10/23/2013 0843   CO2 33 10/13/2011  1204   CO2 32 10/11/2011 1022   BUN 22.3 10/23/2013 0843   BUN 16 10/13/2011 1204   BUN 18 10/11/2011 1022   CREATININE 1.0 10/23/2013 0843   CREATININE 1.0 10/13/2011 1204   CREATININE 0.9 10/11/2011 1022      Component Value Date/Time   CALCIUM 9.3 10/23/2013 0843   CALCIUM 9.1 10/13/2011 1204   CALCIUM 9.1 10/11/2011 1022   ALKPHOS 75 10/23/2013 0843   ALKPHOS 71 10/13/2011 1204   ALKPHOS 63 10/11/2011 1022   AST 23 10/23/2013 0843   AST 31 10/13/2011 1204   AST 25 10/11/2011 1022   ALT 21 10/23/2013 0843   ALT 31 10/13/2011 1204   ALT 24 10/11/2011 1022   BILITOT 0.35 10/23/2013 0843   BILITOT 0.60 10/13/2011 1204   BILITOT 0.5 10/11/2011 1022       RADIOGRAPHIC STUDIES: Ct  Chest Wo Contrast  10/23/2013   CLINICAL DATA:  History of lung cancer diagnosed in June 2010 status post right lower lobe wedge resection.  EXAM: CT CHEST WITHOUT CONTRAST  TECHNIQUE: Multidetector CT imaging of the chest was performed following the standard protocol without IV contrast.  COMPARISON:  Chest CT 10/02/2012.  FINDINGS: Mediastinum: Heart size is borderline enlarged. There is no significant pericardial fluid, thickening or pericardial calcification. There is atherosclerosis of the thoracic aorta, the great vessels of the mediastinum and the coronary arteries, including calcified atherosclerotic plaque in the left main, left anterior descending, left circumflex and right coronary arteries. Mildly enlarged subcarinal lymph node measuring 14 mm in short axis, similar to the prior examination. No definite pathologically enlarged hilar lymph nodes are noted on today's noncontrast CT examination. Please note that accurate exclusion of hilar adenopathy is limited on noncontrast CT scans. Esophagus is unremarkable in appearance.  Lungs/Pleura: Postoperative changes of wedge resection in the right lower lobe are again noted. This is associated with some architectural distortion in the central aspect of the remaining portions of the right lower lobe that is somewhat nodular in appearance, best appreciated on image 35 of series 2. However, this appears stable when compared to prior studies dating back to at least 10/13/2011 (compared to image 36 of series 2 of that examination). This presumably represents chronic postoperative scarring. There is also some focal pleural thickening and expansion of extrapleural fat in the periphery of the apex of the right hemithorax posterolaterally (image 16 of series 2) which is unchanged. Mild septal thickening throughout the right middle and lower lobes appears to be chronic and unchanged compared to prior examinations. No new suspicious appearing pulmonary nodules or masses  are noted. No acute consolidative airspace disease. There is a small amount of chronic pleural thickening in the right hemithorax. No pleural effusions.  Upper Abdomen: Status post cholecystectomy. Diffuse low attenuation throughout the hepatic parenchyma, compatible with hepatic steatosis.  Musculoskeletal: There are no aggressive appearing lytic or blastic lesions noted in the visualized portions of the skeleton.  IMPRESSION: 1. Status post right lower lobe wedge resection, with stable postoperative scarring in the right hemithorax, as discussed above. No definite signs to suggest local recurrence of disease or metastatic disease in the thorax on today's examination. 2. Mildly enlarged subcarinal lymph node is stable in retrospect compared to numerous prior examinations, and is presumably reactive. 3. Atherosclerosis, including left main and 3 vessel coronary artery disease. Please note that although the presence of coronary artery calcium documents the presence of coronary artery disease, the severity of this disease and any potential stenosis  cannot be assessed on this non-gated CT examination. Assessment for potential risk factor modification, dietary therapy or pharmacologic therapy may be warranted, if clinically indicated. 4. Hepatic steatosis.   Electronically Signed   By: Vinnie Langton M.D.   On: 10/23/2013 11:37   ASSESSMENT AND PLAN: This is a very pleasant 63 years old white female with history of stage IA non-small cell lung cancer status post right upper lobe superior segmentectomy in June of 2010 and has been observation since that time with no evidence for disease recurrence. I discussed the scan results with the patient today. I recommended for her to continue on observation with repeat CT scan of the chest in one year.  I advised her to consult with her primary care physician regarding the findings on the CT scan of coronary artery disease. She was advised to call immediately if she has  any concerning symptoms in the interval.   All questions were answered. The patient knows to call the clinic with any problems, questions or concerns. We can certainly see the patient much sooner if necessary.  Disclaimer: This note was dictated with voice recognition software. Similar sounding words can inadvertently be transcribed and may not be corrected upon review.

## 2013-10-25 NOTE — Telephone Encounter (Signed)
Gave pt appt for lab and MD for June 2016

## 2013-11-25 ENCOUNTER — Other Ambulatory Visit: Payer: Self-pay | Admitting: Family Medicine

## 2013-11-26 ENCOUNTER — Other Ambulatory Visit: Payer: Self-pay | Admitting: Family Medicine

## 2014-02-27 ENCOUNTER — Other Ambulatory Visit: Payer: Self-pay | Admitting: Family Medicine

## 2014-02-28 NOTE — Telephone Encounter (Signed)
Patient has not been seen since 09/05/12 and last filled 11/26/13 #90. Please advise     KP

## 2014-03-27 ENCOUNTER — Other Ambulatory Visit: Payer: Self-pay | Admitting: Family Medicine

## 2014-03-30 ENCOUNTER — Other Ambulatory Visit: Payer: Self-pay | Admitting: Family Medicine

## 2014-04-03 ENCOUNTER — Telehealth: Payer: Self-pay | Admitting: Family Medicine

## 2014-04-03 NOTE — Telephone Encounter (Signed)
Caller name:Louischarles, Ivin Booty Relation to ZH:GDJM Call back number:[phone] Pharmacy:[facility]  Reason for call: pt states pharmacy is stated they did not receive request for DIOVAN HCT 160-12.5 MG per. Please resend

## 2014-04-04 MED ORDER — DIOVAN HCT 160-12.5 MG PO TABS: ORAL_TABLET | ORAL | Status: DC

## 2014-04-04 NOTE — Telephone Encounter (Signed)
Rx sent      KP 

## 2014-04-16 ENCOUNTER — Encounter: Payer: Self-pay | Admitting: Family Medicine

## 2014-04-16 ENCOUNTER — Ambulatory Visit (INDEPENDENT_AMBULATORY_CARE_PROVIDER_SITE_OTHER): Payer: BC Managed Care – PPO | Admitting: Family Medicine

## 2014-04-16 VITALS — BP 122/68 | HR 74 | Temp 98.2°F | Wt 273.8 lb

## 2014-04-16 DIAGNOSIS — M159 Polyosteoarthritis, unspecified: Secondary | ICD-10-CM

## 2014-04-16 DIAGNOSIS — M150 Primary generalized (osteo)arthritis: Secondary | ICD-10-CM

## 2014-04-16 DIAGNOSIS — I10 Essential (primary) hypertension: Secondary | ICD-10-CM

## 2014-04-16 MED ORDER — MELOXICAM 7.5 MG PO TABS: ORAL_TABLET | ORAL | Status: DC

## 2014-04-16 MED ORDER — DIOVAN HCT 160-12.5 MG PO TABS: ORAL_TABLET | ORAL | Status: DC

## 2014-04-16 NOTE — Patient Instructions (Signed)

## 2014-04-16 NOTE — Progress Notes (Signed)
Pre visit review using our clinic review tool, if applicable. No additional management support is needed unless otherwise documented below in the visit note. 

## 2014-04-16 NOTE — Progress Notes (Signed)
  Subjective:    Patient here for follow-up of elevated blood pressure.  She is not exercising and is adherent to a low-salt diet.  Blood pressure is well controlled at home. Cardiac symptoms: none. Patient denies: chest pain, chest pressure/discomfort, claudication, dyspnea, exertional chest pressure/discomfort, fatigue, irregular heart beat, lower extremity edema, near-syncope, orthopnea, palpitations, paroxysmal nocturnal dyspnea, syncope and tachypnea. Cardiovascular risk factors: advanced age (older than 57 for men, 62 for women), hypertension, obesity (BMI >= 30 kg/m2) and sedentary lifestyle. Use of agents associated with hypertension: none. History of target organ damage: none.  The following portions of the patient's history were reviewed and updated as appropriate: allergies, current medications, past family history, past medical history, past social history, past surgical history and problem list.  Review of Systems Pertinent items are noted in HPI.     Objective:    BP 122/68 mmHg  Pulse 74  Temp(Src) 98.2 F (36.8 C) (Oral)  Wt 273 lb 12.8 oz (124.195 kg)  SpO2 96% General appearance: alert, cooperative, appears stated age and no distress Neck: no adenopathy, no carotid bruit, no JVD, supple, symmetrical, trachea midline and thyroid not enlarged, symmetric, no tenderness/mass/nodules Lungs: clear to auscultation bilaterally Heart: S1, S2 normal Extremities: extremities normal, atraumatic, no cyanosis or edema and venous stasis dermatitis noted    Assessment:    Hypertension, normal blood pressure . Evidence of target organ damage: none.    Plan:    Medication: no change. Dietary sodium restriction. Regular aerobic exercise. Follow up: 6 months and as needed. -- or sooner prn

## 2014-04-23 ENCOUNTER — Ambulatory Visit: Payer: Self-pay | Admitting: Family Medicine

## 2014-04-24 ENCOUNTER — Other Ambulatory Visit (INDEPENDENT_AMBULATORY_CARE_PROVIDER_SITE_OTHER): Payer: BC Managed Care – PPO

## 2014-04-24 DIAGNOSIS — C3490 Malignant neoplasm of unspecified part of unspecified bronchus or lung: Secondary | ICD-10-CM

## 2014-04-24 LAB — BASIC METABOLIC PANEL
BUN: 18 mg/dL (ref 6–23)
CALCIUM: 9.1 mg/dL (ref 8.4–10.5)
CO2: 28 meq/L (ref 19–32)
Chloride: 103 mEq/L (ref 96–112)
Creatinine, Ser: 0.9 mg/dL (ref 0.4–1.2)
GFR: 71.45 mL/min (ref >60.00)
Glucose, Bld: 93 mg/dL (ref 70–99)
POTASSIUM: 3.8 meq/L (ref 3.5–5.1)
SODIUM: 138 meq/L (ref 135–145)

## 2014-04-25 ENCOUNTER — Other Ambulatory Visit: Payer: Self-pay | Admitting: Medical Oncology

## 2014-04-25 DIAGNOSIS — C3490 Malignant neoplasm of unspecified part of unspecified bronchus or lung: Secondary | ICD-10-CM

## 2014-05-08 ENCOUNTER — Other Ambulatory Visit: Payer: Self-pay | Admitting: Family Medicine

## 2014-05-21 ENCOUNTER — Encounter: Payer: Self-pay | Admitting: Family Medicine

## 2014-05-21 ENCOUNTER — Ambulatory Visit (INDEPENDENT_AMBULATORY_CARE_PROVIDER_SITE_OTHER): Payer: BC Managed Care – PPO | Admitting: Family Medicine

## 2014-05-21 VITALS — BP 126/74 | HR 73 | Temp 98.2°F | Wt 277.0 lb

## 2014-05-21 DIAGNOSIS — R252 Cramp and spasm: Secondary | ICD-10-CM

## 2014-05-21 NOTE — Progress Notes (Signed)
Pre visit review using our clinic review tool, if applicable. No additional management support is needed unless otherwise documented below in the visit note. 

## 2014-05-21 NOTE — Patient Instructions (Signed)

## 2014-05-21 NOTE — Progress Notes (Signed)
   Subjective:    Patient ID: Elizabeth [name], female    DOB: Sep 26, 1950, 64 y.o.   MRN: [id number]  HPI Pt here c/o leg cramps in both legs but mostly right.   No cp, sob.   Review of Systems  Respiratory: Negative for cough, chest tightness, shortness of breath and wheezing.   Cardiovascular: Negative for chest pain and palpitations.  Musculoskeletal: Negative for myalgias, joint swelling and neck stiffness.       Leg cramps --comes and goes  Psychiatric/Behavioral: Negative.        Objective:   Physical Exam  BP 126/74 mmHg  Pulse 73  Temp(Src) 98.2 F (36.8 C) (Oral)  Wt 277 lb (125.646 kg)  SpO2 96% General appearance: alert, cooperative and no distress Lungs: clear to auscultation bilaterally Heart: S1, S2 normal Extremities: extremities normal, atraumatic, no cyanosis or edema  No calf pain      Assessment & Plan:  1. Cramp of both lower extremities K rich diet, check labs  - Basic metabolic panel - Vitamin D 1,25 dihydroxy - Magnesium - Phosphorus

## 2014-05-22 ENCOUNTER — Other Ambulatory Visit (INDEPENDENT_AMBULATORY_CARE_PROVIDER_SITE_OTHER): Payer: BC Managed Care – PPO

## 2014-05-22 DIAGNOSIS — R252 Cramp and spasm: Secondary | ICD-10-CM

## 2014-05-22 LAB — BASIC METABOLIC PANEL
BUN: 20 mg/dL (ref 6–23)
CHLORIDE: 101 meq/L (ref 96–112)
CO2: 32 meq/L (ref 19–32)
Calcium: 9.5 mg/dL (ref 8.4–10.5)
Creatinine, Ser: 0.87 mg/dL (ref 0.40–1.20)
GFR: 69.54 mL/min (ref >60.00)
GLUCOSE: 98 mg/dL (ref 70–99)
POTASSIUM: 3.9 meq/L (ref 3.5–5.1)
SODIUM: 138 meq/L (ref 135–145)

## 2014-05-22 LAB — MAGNESIUM: Magnesium: 1.9 mg/dL (ref 1.5–2.5)

## 2014-05-22 LAB — PHOSPHORUS: Phosphorus: 3.8 mg/dL (ref 2.3–4.6)

## 2014-05-27 ENCOUNTER — Encounter: Payer: Self-pay | Admitting: Family Medicine

## 2014-05-27 LAB — VITAMIN D 1,25 DIHYDROXY
VITAMIN D3 1, 25 (OH): 39 pg/mL
Vitamin D 1, 25 (OH)2 Total: 39 pg/mL (ref 18–72)
Vitamin D2 1, 25 (OH)2: <8 pg/mL

## 2014-05-27 MED ORDER — VITAMIN D (ERGOCALCIFEROL) 1.25 MG (50000 UNIT) PO CAPS: 50000.0000 [IU] | ORAL_CAPSULE | ORAL | Status: DC

## 2014-05-29 ENCOUNTER — Other Ambulatory Visit: Payer: Self-pay | Admitting: Family Medicine

## 2014-06-21 ENCOUNTER — Encounter: Payer: Self-pay | Admitting: Family Medicine

## 2014-06-21 ENCOUNTER — Ambulatory Visit (INDEPENDENT_AMBULATORY_CARE_PROVIDER_SITE_OTHER): Payer: BC Managed Care – PPO | Admitting: Family Medicine

## 2014-06-21 VITALS — BP 122/74 | HR 71 | Temp 97.7°F | Wt 276.2 lb

## 2014-06-21 DIAGNOSIS — H6500 Acute serous otitis media, unspecified ear: Secondary | ICD-10-CM

## 2014-06-21 MED ORDER — LEVOCETIRIZINE DIHYDROCHLORIDE 5 MG PO TABS: ORAL_TABLET | ORAL | Status: DC

## 2014-06-21 MED ORDER — FLUTICASONE PROPIONATE 50 MCG/ACT NA SUSP: 2.0000 | Freq: Every day | NASAL | Status: DC

## 2014-06-21 NOTE — Patient Instructions (Signed)
Serous Otitis Media °Serous otitis media is fluid in the middle ear space. This space contains the bones for hearing and air. Air in the middle ear space helps to transmit sound.  °The air gets there through the eustachian tube. This tube goes from the back of the nose (nasopharynx) to the middle ear space. It keeps the pressure in the middle ear the same as the outside world. It also helps to drain fluid from the middle ear space. °CAUSES  °Serous otitis media occurs when the eustachian tube gets blocked. Blockage can come from: °· Ear infections. °· Colds and other upper respiratory infections. °· Allergies. °· Irritants such as cigarette smoke. °· Sudden changes in air pressure (such as descending in an airplane). °· Enlarged adenoids. °· A mass in the nasopharynx. °During colds and upper respiratory infections, the middle ear space can become temporarily filled with fluid. This can happen after an ear infection also. Once the infection clears, the fluid will generally drain out of the ear through the eustachian tube. If it does not, then serous otitis media occurs. °SIGNS AND SYMPTOMS  °· Hearing loss. °· A feeling of fullness in the ear, without pain. °· Young children may not show any symptoms but may show slight behavioral changes, such as agitation, ear pulling, or crying. °DIAGNOSIS  °Serous otitis media is diagnosed by an ear exam. Tests may be done to check on the movement of the eardrum. Hearing exams may also be done. °TREATMENT  °The fluid most often goes away without treatment. If allergy is the cause, allergy treatment may be helpful. Fluid that persists for several months may require minor surgery. A small tube is placed in the eardrum to: °· Drain the fluid. °· Restore the air in the middle ear space. °In certain situations, antibiotic medicines are used to avoid surgery. Surgery may be done to remove enlarged adenoids (if this is the cause). °HOME CARE INSTRUCTIONS  °· Keep children away from  tobacco smoke. °· Keep all follow-up visits as directed by your health care provider. °SEEK MEDICAL CARE IF:  °· Your hearing is not better in 3 months. °· Your hearing is worse. °· You have ear pain. °· You have drainage from the ear. °· You have dizziness. °· You have serous otitis media only in one ear or have any bleeding from your nose (epistaxis). °· You notice a lump on your neck. °MAKE SURE YOU: °· Understand these instructions.   °· Will watch your condition.   °· Will get help right away if you are not doing well or get worse.   °Document Released: 07/10/2003 Document Revised: 09/03/2013 Document Reviewed: 11/14/2012 °[employer]® Patient Information ©2015 [employer]. This information is not intended to replace advice given to you by your health care provider. Make sure you discuss any questions you have with your health care provider. ° °

## 2014-06-21 NOTE — Progress Notes (Signed)
Subjective:    Patient ID: Elizabeth [name], female    DOB: [birth year], 64 y.o.   MRN: [id number]  HPI  Patient here for R ear pain --ear feels full ---"feels like she is under water".  No other complaints.    Past Medical History  Diagnosis Date  . Hyperlipidemia   . Hypertension   . Low back pain   . Vitamin D deficiency   . Hx of cancer of lung 6/10    Removed June 2010    Review of Systems  Constitutional: Positive for chills. Negative for fever and fatigue.  HENT: Positive for ear pain, postnasal drip and rhinorrhea. Negative for congestion and sinus pressure.   Respiratory: Negative for cough, chest tightness, shortness of breath and wheezing.   Cardiovascular: Negative for chest pain, palpitations and leg swelling.  Allergic/Immunologic: Negative for environmental allergies.  Psychiatric/Behavioral: Negative for decreased concentration and agitation. The patient is not nervous/anxious.        Objective:    Physical Exam  Constitutional: She is oriented to person, place, and time. She appears well-developed and well-nourished.  HENT:  Right Ear: Hearing, external ear and ear canal normal. Tympanic membrane is bulging. Tympanic membrane mobility is abnormal.  Left Ear: Hearing, tympanic membrane, external ear and ear canal normal.  + PND + errythema  Eyes: Conjunctivae are normal. Right eye exhibits no discharge. Left eye exhibits no discharge.  Cardiovascular: Normal rate, regular rhythm and normal heart sounds.   No murmur heard. Pulmonary/Chest: Effort normal and breath sounds normal. No respiratory distress. She has no wheezes. She has no rales. She exhibits no tenderness.  Musculoskeletal: She exhibits no edema.  Lymphadenopathy:    She has no cervical adenopathy.  Neurological: She is alert and oriented to person, place, and time.    BP 122/74 mmHg  Pulse 71  Temp(Src) 97.7 F (36.5 C) (Oral)  Wt 276 lb 3.2 oz (125.283 kg)  SpO2 97% Wt Readings from Last  3 Encounters:  06/21/14 276 lb 3.2 oz (125.283 kg)  05/21/14 277 lb (125.646 kg)  04/16/14 273 lb 12.8 oz (124.195 kg)     Lab Results  Component Value Date   WBC 8.3 10/02/2012   HGB 12.7 10/02/2012   HCT 38.4 10/02/2012   PLT 179 10/02/2012   GLUCOSE 98 05/22/2014   CHOL 173 10/11/2011   TRIG 142.0 10/11/2011   HDL 43.50 10/11/2011   LDLCALC 101* 10/11/2011   ALT 21 10/23/2013   AST 23 10/23/2013   NA 138 05/22/2014   K 3.9 05/22/2014   CL 101 05/22/2014   CREATININE 0.87 05/22/2014   BUN 20 05/22/2014   CO2 32 05/22/2014   TSH 1.90 10/11/2011   INR 1.0 10/16/2008   HGBA1C 5.7 06/21/2011    Ct Chest Wo Contrast  10/23/2013   CLINICAL DATA:  History of lung cancer diagnosed in June 2010 status post right lower lobe wedge resection.  EXAM: CT CHEST WITHOUT CONTRAST  TECHNIQUE: Multidetector CT imaging of the chest was performed following the standard protocol without IV contrast.  COMPARISON:  Chest CT 10/02/2012.  FINDINGS: Mediastinum: Heart size is borderline enlarged. There is no significant pericardial fluid, thickening or pericardial calcification. There is atherosclerosis of the thoracic aorta, the great vessels of the mediastinum and the coronary arteries, including calcified atherosclerotic plaque in the left main, left anterior descending, left circumflex and right coronary arteries. Mildly enlarged subcarinal lymph node measuring 14 mm in short axis, similar to the prior examination.  No definite pathologically enlarged hilar lymph nodes are noted on today's noncontrast CT examination. Please note that accurate exclusion of hilar adenopathy is limited on noncontrast CT scans. Esophagus is unremarkable in appearance.  Lungs/Pleura: Postoperative changes of wedge resection in the right lower lobe are again noted. This is associated with some architectural distortion in the central aspect of the remaining portions of the right lower lobe that is somewhat nodular in appearance,  best appreciated on image 35 of series 2. However, this appears stable when compared to prior studies dating back to at least 10/13/2011 (compared to image 36 of series 2 of that examination). This presumably represents chronic postoperative scarring. There is also some focal pleural thickening and expansion of extrapleural fat in the periphery of the apex of the right hemithorax posterolaterally (image 16 of series 2) which is unchanged. Mild septal thickening throughout the right middle and lower lobes appears to be chronic and unchanged compared to prior examinations. No new suspicious appearing pulmonary nodules or masses are noted. No acute consolidative airspace disease. There is a small amount of chronic pleural thickening in the right hemithorax. No pleural effusions.  Upper Abdomen: Status post cholecystectomy. Diffuse low attenuation throughout the hepatic parenchyma, compatible with hepatic steatosis.  Musculoskeletal: There are no aggressive appearing lytic or blastic lesions noted in the visualized portions of the skeleton.  IMPRESSION: 1. Status post right lower lobe wedge resection, with stable postoperative scarring in the right hemithorax, as discussed above. No definite signs to suggest local recurrence of disease or metastatic disease in the thorax on today's examination. 2. Mildly enlarged subcarinal lymph node is stable in retrospect compared to numerous prior examinations, and is presumably reactive. 3. Atherosclerosis, including left main and 3 vessel coronary artery disease. Please note that although the presence of coronary artery calcium documents the presence of coronary artery disease, the severity of this disease and any potential stenosis cannot be assessed on this non-gated CT examination. Assessment for potential risk factor modification, dietary therapy or pharmacologic therapy may be warranted, if clinically indicated. 4. Hepatic steatosis.   Electronically Signed   By: Vinnie Langton M.D.   On: 10/23/2013 11:37       Assessment & Plan:   Problem List Items Addressed This Visit    None    Visit Diagnoses    Acute serous otitis media, recurrence not specified, unspecified laterality    -  Primary    Relevant Medications    levocetirizine (XYZAL) tablet 5 mg    fluticasone (FLONASE) 50 MCG nasal spray      rto or call if symptoms no better or worsen.     [name], DO

## 2014-06-21 NOTE — Progress Notes (Signed)
Pre visit review using our clinic review tool, if applicable. No additional management support is needed unless otherwise documented below in the visit note. 

## 2014-07-15 ENCOUNTER — Ambulatory Visit (INDEPENDENT_AMBULATORY_CARE_PROVIDER_SITE_OTHER): Payer: BC Managed Care – PPO | Admitting: Family Medicine

## 2014-07-15 ENCOUNTER — Encounter: Payer: Self-pay | Admitting: Family Medicine

## 2014-07-15 VITALS — BP 128/82 | HR 75 | Temp 98.1°F | Wt 274.6 lb

## 2014-07-15 DIAGNOSIS — J208 Acute bronchitis due to other specified organisms: Secondary | ICD-10-CM

## 2014-07-15 MED ORDER — AZITHROMYCIN 250 MG PO TABS: ORAL_TABLET | ORAL | Status: DC

## 2014-07-15 NOTE — Progress Notes (Signed)
Subjective:    Patient ID: Elizabeth [name], female    DOB: [birth year], 64 y.o.   MRN: [id number]  HPI  Patient here for c/o cough that is productive and has specks of blood in it.  She has hx lung cancer and sees onc annually.  CT set for 6/ 2016.   No fevers-- + chills. + nasal congestion.  + body aches Symptoms x 1 month  Past Medical History  Diagnosis Date  . Hyperlipidemia   . Hypertension   . Low back pain   . Vitamin D deficiency   . Hx of cancer of lung 6/10    Removed June 2010    Review of Systems  Constitutional: Positive for chills. Negative for fever.  HENT: Positive for congestion and ear pain. Negative for postnasal drip and sinus pressure.   Respiratory: Positive for cough, chest tightness and shortness of breath. Negative for wheezing.   Cardiovascular: Negative for chest pain, palpitations and leg swelling.  Allergic/Immunologic: Negative for environmental allergies.  Psychiatric/Behavioral: Negative for dysphoric mood, decreased concentration and agitation. The patient is not nervous/anxious.     Current Outpatient Prescriptions on File Prior to Visit  Medication Sig Dispense Refill  . clobetasol ointment (TEMOVATE) 0.05 %     . DIOVAN HCT 160-12.5 MG per tablet Take 1 tablet by mouth daily. 30 tablet 5  . Homeopathic Products ([employer] LEG CRAMPS PAIN RELIEF) TABS Take by mouth.    . levocetirizine (XYZAL) 5 MG tablet 1 po qd prn 30 tablet 11  . Magnesium 200 MG TABS Take 1 tablet by mouth as needed.    . meloxicam (MOBIC) 7.5 MG tablet Take 1 tablet (7.5 mg total) by mouth daily. 90 tablet 0  . Vitamin D, Ergocalciferol, (DRISDOL) 50000 UNITS CAPS capsule Take 1 capsule (50,000 Units total) by mouth every 7 (seven) days. 12 capsule 0   No current facility-administered medications on file prior to visit.       Objective:    Physical Exam  Constitutional: She is oriented to person, place, and time. She appears well-developed and well-nourished. No distress.   HENT:  Head: Normocephalic and atraumatic.  Right Ear: Tympanic membrane and external ear normal.  Left Ear: There is tenderness. Tympanic membrane is injected, erythematous and bulging. No decreased hearing is noted.  Nose: Nose normal.  Mouth/Throat: Oropharynx is clear and moist.  TMs normal bilaterally Mild nasal congestion Throat w/out erythema, edema, or exudate  Eyes: Conjunctivae and EOM are normal. Pupils are equal, round, and reactive to light.  Neck: Normal range of motion. Neck supple.  Cardiovascular: Normal rate, regular rhythm, normal heart sounds and intact distal pulses.   No murmur heard. Pulmonary/Chest: Breath sounds normal. No respiratory distress. She has no wheezes. She has no rales. She exhibits no tenderness.  + hacking cough  Lymphadenopathy:    She has no cervical adenopathy.  Neurological: She is alert and oriented to person, place, and time.  Psychiatric: She has a normal mood and affect. Her behavior is normal. Judgment and thought content normal.    BP 128/82 mmHg  Pulse 75  Temp(Src) 98.1 F (36.7 C) (Oral)  Wt 274 lb 9.6 oz (124.558 kg)  SpO2 94% Wt Readings from Last 3 Encounters:  07/15/14 274 lb 9.6 oz (124.558 kg)  06/21/14 276 lb 3.2 oz (125.283 kg)  05/21/14 277 lb (125.646 kg)     Lab Results  Component Value Date   WBC 8.3 10/02/2012   HGB 12.7 10/02/2012  HCT 38.4 10/02/2012   PLT 179 10/02/2012   GLUCOSE 98 05/22/2014   CHOL 173 10/11/2011   TRIG 142.0 10/11/2011   HDL 43.50 10/11/2011   LDLCALC 101* 10/11/2011   ALT 21 10/23/2013   AST 23 10/23/2013   NA 138 05/22/2014   K 3.9 05/22/2014   CL 101 05/22/2014   CREATININE 0.87 05/22/2014   BUN 20 05/22/2014   CO2 32 05/22/2014   TSH 1.90 10/11/2011   INR 1.0 10/16/2008   HGBA1C 5.7 06/21/2011       Assessment & Plan:   Problem List Items Addressed This Visit    None      I have discontinued Ms. Hudspeth's Vitamin D and fluticasone. I am also having her maintain  her clobetasol ointment, DIOVAN HCT, [employer] LE[employer] CRAMPS PAIN RELIEF, Vitamin D (Ergocalciferol), meloxicam, Magnesium, levocetirizine, and Cholecalciferol.  Meds ordered this encounter  Medications  . Cholecalciferol 4000 UNITS CAPS    Sig: Take 4,000 capsules by mouth daily.   1. Acute bronchitis due to other specified organisms mucinex otc rto 2 weeks --- if no better will get cxr and consider ct with hx lung ca - azithromycin (ZITHROMAX Z-PAK) 250 MG tablet; As directed  Dispense: 6 each; Refill: 0   [name], DO

## 2014-07-15 NOTE — Patient Instructions (Signed)

## 2014-07-15 NOTE — Progress Notes (Signed)
Pre visit review using our clinic review tool, if applicable. No additional management support is needed unless otherwise documented below in the visit note. 

## 2014-07-17 ENCOUNTER — Encounter: Payer: Self-pay | Admitting: Family Medicine

## 2014-07-19 NOTE — Telephone Encounter (Signed)
Ok to extend note

## 2014-08-09 ENCOUNTER — Encounter: Payer: Self-pay | Admitting: Family Medicine

## 2014-08-09 NOTE — Telephone Encounter (Signed)
We can not do abx over phone-- she can use otc for symptomatic relief.  If it does come back we would go with something different anyway

## 2014-08-19 ENCOUNTER — Encounter: Payer: Self-pay | Admitting: Family Medicine

## 2014-08-19 DIAGNOSIS — R7989 Other specified abnormal findings of blood chemistry: Secondary | ICD-10-CM

## 2014-08-19 NOTE — Telephone Encounter (Signed)
Future lab ordered and lab appointment scheduled for 08/22/14 per patient preference.

## 2014-08-19 NOTE — Telephone Encounter (Signed)
We should check it again

## 2014-08-20 ENCOUNTER — Encounter: Payer: Self-pay | Admitting: Family Medicine

## 2014-08-22 ENCOUNTER — Other Ambulatory Visit (INDEPENDENT_AMBULATORY_CARE_PROVIDER_SITE_OTHER): Payer: BC Managed Care – PPO

## 2014-08-22 ENCOUNTER — Encounter: Payer: Self-pay | Admitting: Family Medicine

## 2014-08-22 ENCOUNTER — Ambulatory Visit (INDEPENDENT_AMBULATORY_CARE_PROVIDER_SITE_OTHER): Payer: BC Managed Care – PPO | Admitting: Family Medicine

## 2014-08-22 ENCOUNTER — Ambulatory Visit (HOSPITAL_BASED_OUTPATIENT_CLINIC_OR_DEPARTMENT_OTHER)
Admission: RE | Admit: 2014-08-22 | Discharge: 2014-08-22 | Disposition: A | Discharge status: Home / Self Care | Payer: BC Managed Care – PPO | Source: Ambulatory Visit | Attending: Family Medicine | Admitting: Family Medicine

## 2014-08-22 VITALS — BP 144/78 | HR 76 | Temp 97.8°F | Resp 18 | Ht 64.0 in | Wt 272.0 lb

## 2014-08-22 DIAGNOSIS — R7989 Other specified abnormal findings of blood chemistry: Secondary | ICD-10-CM | POA: Diagnosis not present

## 2014-08-22 DIAGNOSIS — R059 Cough, unspecified: Secondary | ICD-10-CM

## 2014-08-22 DIAGNOSIS — Z85118 Personal history of other malignant neoplasm of bronchus and lung: Secondary | ICD-10-CM | POA: Insufficient documentation

## 2014-08-22 DIAGNOSIS — Z87891 Personal history of nicotine dependence: Secondary | ICD-10-CM | POA: Insufficient documentation

## 2014-08-22 DIAGNOSIS — R918 Other nonspecific abnormal finding of lung field: Secondary | ICD-10-CM | POA: Diagnosis not present

## 2014-08-22 DIAGNOSIS — R05 Cough: Secondary | ICD-10-CM

## 2014-08-22 MED ORDER — BECLOMETHASONE DIPROPIONATE 40 MCG/ACT IN AERS: 2.0000 | INHALATION_SPRAY | Freq: Two times a day (BID) | RESPIRATORY_TRACT | Status: DC

## 2014-08-22 MED ORDER — ALBUTEROL SULFATE HFA 108 (90 BASE) MCG/ACT IN AERS: 2.0000 | INHALATION_SPRAY | Freq: Four times a day (QID) | RESPIRATORY_TRACT | Status: DC | PRN

## 2014-08-22 NOTE — Progress Notes (Signed)
Pre visit review using our clinic review tool, if applicable. No additional management support is needed unless otherwise documented below in the visit note. 

## 2014-08-22 NOTE — Progress Notes (Signed)
Patient ID: Elizabeth [name], female    DOB: 31-Jan-1951  Age: 64 y.o. MRN: [id number]    Subjective:  Subjective HPI Elizabeth [name] presents for recurrent cough.  She got better after last ov but then symptoms returned.  She thinks it has to do with being in walk in freezer at work.  When she does not have to go in she is fine.  She has not had to be in it for last few days and symptoms have improved significantly.   Review of Systems  Constitutional: Negative for fever and chills.  HENT: Negative for congestion, postnasal drip, rhinorrhea and sinus pressure.   Respiratory: Positive for cough and chest tightness. Negative for shortness of breath and wheezing.   Cardiovascular: Negative for chest pain, palpitations and leg swelling.  Allergic/Immunologic: Negative for environmental allergies.    History Past Medical History  Diagnosis Date  . Hyperlipidemia   . Hypertension   . Low back pain   . Vitamin D deficiency   . Hx of cancer of lung 6/10    Removed June 2010    She has past surgical history that includes Cholecystectomy; Tubal ligation; and Lung removal, partial.   Her family history includes Cancer in her brother and mother; Heart disease in her father; Hypertension in her mother; Stroke in her mother.She reports that she quit smoking about 9 years ago. Her smoking use included Cigarettes. She has never used smokeless tobacco. She reports that she does not drink alcohol or use illicit drugs.  Current Outpatient Prescriptions on File Prior to Visit  Medication Sig Dispense Refill  . Cholecalciferol 4000 UNITS CAPS Take 4,000 capsules by mouth daily.    . clobetasol ointment (TEMOVATE) 0.05 %     . DIOVAN HCT 160-12.5 MG per tablet Take 1 tablet by mouth daily. 30 tablet 5  . Homeopathic Products ([employer] LEG CRAMPS PAIN RELIEF) TABS Take by mouth.    . meloxicam (MOBIC) 7.5 MG tablet Take 1 tablet (7.5 mg total) by mouth daily. 90 tablet 0   No current facility-administered  medications on file prior to visit.     Objective:  Objective Physical Exam  Constitutional: She is oriented to person, place, and time. She appears well-developed and well-nourished. No distress.  HENT:  Head: Normocephalic and atraumatic.  TMs normal bilaterally Throat w/out erythema, edema, or exudate  Eyes: Conjunctivae and EOM are normal. Pupils are equal, round, and reactive to light.  Neck: Normal range of motion. Neck supple.  Cardiovascular: Normal rate, regular rhythm, normal heart sounds and intact distal pulses.   No murmur heard. Pulmonary/Chest: Effort normal and breath sounds normal. No respiratory distress. She has no wheezes.  + hacking cough  Lymphadenopathy:    She has no cervical adenopathy.  Neurological: She is alert and oriented to person, place, and time.  Psychiatric: She has a normal mood and affect. Her behavior is normal. Thought content normal.   BP 144/78 mmHg  Pulse 76  Temp(Src) 97.8 F (36.6 C) (Oral)  Resp 18  Ht '5\' 4"'$  (1.626 m)  Wt 272 lb (123.378 kg)  BMI 46.67 kg/m2  SpO2 97% Wt Readings from Last 3 Encounters:  08/22/14 272 lb (123.378 kg)  07/15/14 274 lb 9.6 oz (124.558 kg)  06/21/14 276 lb 3.2 oz (125.283 kg)     Lab Results  Component Value Date   WBC 8.3 10/02/2012   HGB 12.7 10/02/2012   HCT 38.4 10/02/2012   PLT 179 10/02/2012   GLUCOSE  98 05/22/2014   CHOL 173 10/11/2011   TRIG 142.0 10/11/2011   HDL 43.50 10/11/2011   LDLCALC 101* 10/11/2011   ALT 21 10/23/2013   AST 23 10/23/2013   NA 138 05/22/2014   K 3.9 05/22/2014   CL 101 05/22/2014   CREATININE 0.87 05/22/2014   BUN 20 05/22/2014   CO2 32 05/22/2014   TSH 1.90 10/11/2011   INR 1.0 10/16/2008   HGBA1C 5.7 06/21/2011    No results found.   Assessment & Plan:  Plan I have discontinued Ms. Samples's Vitamin D (Ergocalciferol), Magnesium, levocetirizine, and azithromycin. I am also having her start on beclomethasone and albuterol. Additionally, I am having  her maintain her clobetasol ointment, DIOVAN HCT, [employer] LEG CRAMPS PAIN RELIEF, meloxicam, and Cholecalciferol.  Meds ordered this encounter  Medications  . beclomethasone (QVAR) 40 MCG/ACT inhaler    Sig: Inhale 2 puffs into the lungs 2 (two) times daily.    Dispense:  1 Inhaler    Refill:  12  . albuterol (PROAIR HFA) 108 (90 BASE) MCG/ACT inhaler    Sig: Inhale 2 puffs into the lungs every 6 (six) hours as needed for wheezing or shortness of breath.    Dispense:  1 Inhaler    Refill:  2    Problem List Items Addressed This Visit    COUGH - Primary   Relevant Medications   beclomethasone (QVAR) 40 MCG/ACT inhaler   albuterol (PROAIR HFA) 108 (90 BASE) MCG/ACT inhaler   Other Relevant Orders   DG Chest 2 View      Follow-up: Return if symptoms worsen or fail to improve.  [name], DO

## 2014-08-22 NOTE — Patient Instructions (Signed)
Bronchospasm °A bronchospasm is a spasm or tightening of the airways going into the lungs. During a bronchospasm breathing becomes more difficult because the airways get smaller. When this happens there can be coughing, a whistling sound when breathing (wheezing), and difficulty breathing. Bronchospasm is often associated with asthma, but not all patients who experience a bronchospasm have asthma. °CAUSES  °A bronchospasm is caused by inflammation or irritation of the airways. The inflammation or irritation may be triggered by:  °· Allergies (such as to animals, pollen, food, or mold). Allergens that cause bronchospasm may cause wheezing immediately after exposure or many hours later.   °· Infection. Viral infections are believed to be the most common cause of bronchospasm.   °· Exercise.   °· Irritants (such as pollution, cigarette smoke, strong odors, aerosol sprays, and paint fumes).   °· Weather changes. Winds increase molds and pollens in the air. Rain refreshes the air by washing irritants out. Cold air may cause inflammation.   °· Stress and emotional upset.   °SIGNS AND SYMPTOMS  °· Wheezing.   °· Excessive nighttime coughing.   °· Frequent or severe coughing with a simple cold.   °· Chest tightness.   °· Shortness of breath.   °DIAGNOSIS  °Bronchospasm is usually diagnosed through a history and physical exam. Tests, such as chest X-rays, are sometimes done to look for other conditions. °TREATMENT  °· Inhaled medicines can be given to open up your airways and help you breathe. The medicines can be given using either an inhaler or a nebulizer machine. °· Corticosteroid medicines may be given for severe bronchospasm, usually when it is associated with asthma. °HOME CARE INSTRUCTIONS  °· Always have a plan prepared for seeking medical care. Know when to call your health care provider and local emergency services (911 in the [place] Know where you can access local emergency care. °· Only take medicines as  directed by your health care provider. °· If you were prescribed an inhaler or nebulizer machine, ask your health care provider to explain how to use it correctly. Always use a spacer with your inhaler if you were given one. °· It is necessary to remain calm during an attack. Try to relax and breathe more slowly.  °· Control your home environment in the following ways:   °¨ Change your heating and air conditioning filter at least once a month.   °¨ Limit your use of fireplaces and wood stoves. °¨ Do not smoke and do not allow smoking in your home.   °¨ Avoid exposure to perfumes and fragrances.   °¨ Get rid of pests (such as roaches and mice) and their droppings.   °¨ Throw away plants if you see mold on them.   °¨ Keep your house clean and dust free.   °¨ Replace carpet with wood, tile, or vinyl flooring. Carpet can trap dander and dust.   °¨ Use allergy-proof pillows, mattress covers, and box spring covers.   °¨ Wash bed sheets and blankets every week in hot water and dry them in a dryer.   °¨ Use blankets that are made of polyester or cotton.   °¨ Wash hands frequently. °SEEK MEDICAL CARE IF:  °· You have muscle aches.   °· You have chest pain.   °· The sputum changes from clear or white to yellow, green, gray, or bloody.   °· The sputum you cough up gets thicker.   °· There are problems that may be related to the medicine you are given, such as a rash, itching, swelling, or trouble breathing.   °SEEK IMMEDIATE MEDICAL CARE IF:  °· You have worsening wheezing and coughing even   after taking your prescribed medicines.   °· You have increased difficulty breathing.   °· You develop severe chest pain. °MAKE SURE YOU:  °· Understand these instructions. °· Will watch your condition. °· Will get help right away if you are not doing well or get worse. °Document Released: 04/22/2003 Document Revised: 04/24/2013 Document Reviewed: 10/09/2012 °[employer]® Patient Information ©2015 [employer]. This information is not  intended to replace advice given to you by your health care provider. Make sure you discuss any questions you have with your health care provider. ° °

## 2014-08-23 ENCOUNTER — Encounter: Payer: Self-pay | Admitting: Family Medicine

## 2014-08-23 ENCOUNTER — Telehealth: Payer: Self-pay | Admitting: Family Medicine

## 2014-08-23 DIAGNOSIS — Z85118 Personal history of other malignant neoplasm of bronchus and lung: Secondary | ICD-10-CM

## 2014-08-23 DIAGNOSIS — R911 Solitary pulmonary nodule: Secondary | ICD-10-CM

## 2014-08-23 NOTE — Telephone Encounter (Signed)
Caller name: Beverlee Nims from [facility]  Call back number: [phone]   Reason for call:  Please call regarding  chest x ray results

## 2014-08-23 NOTE — Telephone Encounter (Signed)
Diane wanted to be sure we see the report of thus CXR.      Advise patient,    No acute findings such as pneumonia.    She has developed a very small nodule on the left side, please arrange for a CT chest without contrast DX nodule, history of lung cancer.    Arrange a visit with PCP to discuss CT results    CT scan entered.  Message left for the patient to call the office       KP

## 2014-08-24 ENCOUNTER — Ambulatory Visit (HOSPITAL_BASED_OUTPATIENT_CLINIC_OR_DEPARTMENT_OTHER)
Admission: RE | Admit: 2014-08-24 | Discharge: 2014-08-24 | Disposition: A | Discharge status: Home / Self Care | Payer: BC Managed Care – PPO | Source: Ambulatory Visit | Attending: Family Medicine | Admitting: Family Medicine

## 2014-08-24 ENCOUNTER — Ambulatory Visit (HOSPITAL_BASED_OUTPATIENT_CLINIC_OR_DEPARTMENT_OTHER): Payer: BC Managed Care – PPO

## 2014-08-24 DIAGNOSIS — Z87891 Personal history of nicotine dependence: Secondary | ICD-10-CM | POA: Insufficient documentation

## 2014-08-24 DIAGNOSIS — R911 Solitary pulmonary nodule: Secondary | ICD-10-CM | POA: Diagnosis not present

## 2014-08-24 DIAGNOSIS — Z85118 Personal history of other malignant neoplasm of bronchus and lung: Secondary | ICD-10-CM | POA: Diagnosis not present

## 2014-08-25 ENCOUNTER — Other Ambulatory Visit: Payer: Self-pay | Admitting: Family Medicine

## 2014-08-25 LAB — VITAMIN D 1,25 DIHYDROXY
VITAMIN D 1, 25 (OH) TOTAL: 36 pg/mL (ref 18–72)
VITAMIN D2 1, 25 (OH): 11 pg/mL
Vitamin D3 1, 25 (OH)2: 25 pg/mL

## 2014-08-26 ENCOUNTER — Ambulatory Visit: Payer: Self-pay | Admitting: Family Medicine

## 2014-08-26 ENCOUNTER — Ambulatory Visit (INDEPENDENT_AMBULATORY_CARE_PROVIDER_SITE_OTHER): Payer: BC Managed Care – PPO | Admitting: Family Medicine

## 2014-08-26 ENCOUNTER — Encounter: Payer: Self-pay | Admitting: Family Medicine

## 2014-08-26 VITALS — BP 118/70 | HR 76 | Temp 97.9°F | Resp 18 | Ht 64.0 in | Wt 274.0 lb

## 2014-08-26 DIAGNOSIS — R938 Abnormal findings on diagnostic imaging of other specified body structures: Secondary | ICD-10-CM | POA: Diagnosis not present

## 2014-08-26 DIAGNOSIS — R9389 Abnormal findings on diagnostic imaging of other specified body structures: Secondary | ICD-10-CM

## 2014-08-26 DIAGNOSIS — E559 Vitamin D deficiency, unspecified: Secondary | ICD-10-CM | POA: Diagnosis not present

## 2014-08-26 DIAGNOSIS — E785 Hyperlipidemia, unspecified: Secondary | ICD-10-CM | POA: Diagnosis not present

## 2014-08-26 MED ORDER — VITAMIN D (ERGOCALCIFEROL) 1.25 MG (50000 UNIT) PO CAPS: 50000.0000 [IU] | ORAL_CAPSULE | ORAL | Status: DC

## 2014-08-26 NOTE — Progress Notes (Signed)
Patient ID: Elizabeth [name], female    DOB: [birth year]  Age: 64 y.o. MRN: [id number]    Subjective:  Subjective HPI Elizabeth [name] presents for f/u ct and has questions about vita d and mammogram.  Review of Systems  Constitutional: Negative for activity change, appetite change, fatigue and unexpected weight change.  Respiratory: Negative for cough and shortness of breath.   Cardiovascular: Negative for chest pain and palpitations.  Psychiatric/Behavioral: Negative for behavioral problems and dysphoric mood. The patient is not nervous/anxious.     History Past Medical History  Diagnosis Date  . Hyperlipidemia   . Hypertension   . Low back pain   . Vitamin D deficiency   . Hx of cancer of lung 6/10    Removed June 2010    She has past surgical history that includes Cholecystectomy; Tubal ligation; and Lung removal, partial.   Her family history includes Cancer in her brother and mother; Heart disease in her father; Hypertension in her mother; Stroke in her mother.She reports that she quit smoking about 9 years ago. Her smoking use included Cigarettes. She has never used smokeless tobacco. She reports that she does not drink alcohol or use illicit drugs.  Current Outpatient Prescriptions on File Prior to Visit  Medication Sig Dispense Refill  . albuterol (PROAIR HFA) 108 (90 BASE) MCG/ACT inhaler Inhale 2 puffs into the lungs every 6 (six) hours as needed for wheezing or shortness of breath. 1 Inhaler 2  . beclomethasone (QVAR) 40 MCG/ACT inhaler Inhale 2 puffs into the lungs 2 (two) times daily. 1 Inhaler 12  . Cholecalciferol 4000 UNITS CAPS Take 4,000 capsules by mouth daily.    . clobetasol ointment (TEMOVATE) 0.05 %     . DIOVAN HCT 160-12.5 MG per tablet Take 1 tablet by mouth daily. 30 tablet 5  . Homeopathic Products ([employer] LEG CRAMPS PAIN RELIEF) TABS Take by mouth.    . meloxicam (MOBIC) 7.5 MG tablet TAKE 1 TABLET (7.5 MG TOTAL) BY MOUTH DAILY. 90 tablet 1   No current  facility-administered medications on file prior to visit.     Objective:  Objective Physical Exam  Constitutional: She is oriented to person, place, and time. She appears well-developed and well-nourished. No distress.  HENT:  Head: Normocephalic and atraumatic.  Eyes: Conjunctivae and EOM are normal. Pupils are equal, round, and reactive to light.  Neck: Normal range of motion. Neck supple. No thyromegaly present.  Cardiovascular: Normal rate, regular rhythm, normal heart sounds and intact distal pulses.   No murmur heard. Pulmonary/Chest: Effort normal and breath sounds normal. No respiratory distress.  Abdominal: Soft. She exhibits no distension. There is no tenderness.  Musculoskeletal: She exhibits no edema.  Lymphadenopathy:    She has no cervical adenopathy.  Neurological: She is alert and oriented to person, place, and time.  Skin: Skin is warm and dry.  Psychiatric: She has a normal mood and affect. Her behavior is normal.   BP 118/70 mmHg  Pulse 76  Temp(Src) 97.9 F (36.6 C) (Oral)  Resp 18  Ht '5\' 4"'$  (1.626 m)  Wt 274 lb (124.286 kg)  BMI 47.01 kg/m2  SpO2 97% Wt Readings from Last 3 Encounters:  08/26/14 274 lb (124.286 kg)  08/22/14 272 lb (123.378 kg)  07/15/14 274 lb 9.6 oz (124.558 kg)     Lab Results  Component Value Date   WBC 8.3 10/02/2012   HGB 12.7 10/02/2012   HCT 38.4 10/02/2012   PLT 179 10/02/2012  GLUCOSE 98 05/22/2014   CHOL 173 10/11/2011   TRIG 142.0 10/11/2011   HDL 43.50 10/11/2011   LDLCALC 101* 10/11/2011   ALT 21 10/23/2013   AST 23 10/23/2013   NA 138 05/22/2014   K 3.9 05/22/2014   CL 101 05/22/2014   CREATININE 0.87 05/22/2014   BUN 20 05/22/2014   CO2 32 05/22/2014   TSH 1.90 10/11/2011   INR 1.0 10/16/2008   HGBA1C 5.7 06/21/2011    Ct Chest Wo Contrast  08/24/2014   CLINICAL DATA:  RIGHT lower lobe lung cancer 7 years ago. Pulmonary nodule on chest x-ray 08/22/2014.  EXAM: CT CHEST WITHOUT CONTRAST  TECHNIQUE:  Multidetector CT imaging of the chest was performed following the standard protocol without IV contrast.  COMPARISON:  Chest CT 10/23/2013.  FINDINGS: There is no corresponding pulmonary nodule identified in the LEFT mid lung. Buckling of the LEFT fifth and sixth ribs is incidentally noted, chronic. Coronary artery atherosclerosis is present. If office based assessment of coronary risk factors has not been performed, it is now recommended. Aortic and branch vessel atherosclerosis. Chronic pleural thickening at the RIGHT lung base. No axillary adenopathy. No mediastinal adenopathy. Hilar adenopathy poorly evaluated in the absence of IV contrast. No pericardial effusion. No pleural effusion.  Incidental imaging of the upper abdomen is within normal limits. Cholecystectomy clips are present. Abdominal aortic atherosclerosis. Surgical clips are present at the RIGHT hilum extending to the RIGHT lower lobe. RIGHT lower lobe scarring. Prominent extrapleural fat is present in the RIGHT upper chest associated with prior surgery. No aggressive osseous lesions are identified. Thoracic vertebral body height is preserved.  IMPRESSION: 1. RIGHT upper lobe nodule on prior chest radiograph represents extrapleural fat associated with prior operation. 2. No LEFT lung pulmonary nodule is identified on today's exam. 3. Atherosclerosis and coronary artery disease. 4. Postsurgical changes at the RIGHT hilum compatible with partial lobectomy.   Electronically Signed   By: Dereck Ligas M.D.   On: 08/24/2014 21:59     Assessment & Plan:  Plan I am having Elizabeth [name] start on Vitamin D (Ergocalciferol). I am also having her maintain her clobetasol ointment, DIOVAN HCT, [employer] LEG CRAMPS PAIN RELIEF, Cholecalciferol, beclomethasone, albuterol, and meloxicam.  Meds ordered this encounter  Medications  . Vitamin D, Ergocalciferol, (DRISDOL) 50000 UNITS CAPS capsule    Sig: Take 1 capsule (50,000 Units total) by mouth every 7 (seven)  days.    Dispense:  12 capsule    Refill:  5    Problem List Items Addressed This Visit    Hyperlipidemia    rto for cpe and check labs Asa 81 mg daily       Other Visit Diagnoses    Vitamin D deficiency    -  Primary    Relevant Medications    Vitamin D, Ergocalciferol, (DRISDOL) 50000 UNITS CAPS capsule    Abnormal CXR           Follow-up: Return if symptoms worsen or fail to improve, for cpe as scheduled.  [name], DO

## 2014-08-26 NOTE — Patient Instructions (Signed)
Vitamin D Deficiency Vitamin D is an important vitamin that your body needs. Having too little of it in your body is called a deficiency. A very bad deficiency can make your bones soft and can cause a condition called rickets.  Vitamin D is important to your body for different reasons, such as:   It helps your body absorb 2 minerals called calcium and phosphorus.  It helps make your bones healthy.  It may prevent some diseases, such as diabetes and multiple sclerosis.  It helps your muscles and heart. You can get vitamin D in several ways. It is a natural part of some foods. The vitamin is also added to some dairy products and cereals. Some people take vitamin D supplements. Also, your body makes vitamin D when you are in the sun. It changes the sun's rays into a form of the vitamin that your body can use. CAUSES   Not eating enough foods that contain vitamin D.  Not getting enough sunlight.  Having certain digestive system diseases that make it hard to absorb vitamin D. These diseases include Crohn's disease, chronic pancreatitis, and cystic fibrosis.  Having a surgery in which part of the stomach or small intestine is removed.  Being obese. Fat cells pull vitamin D out of your blood. That means that obese people may not have enough vitamin D left in their blood and in other body tissues.  Having chronic kidney or liver disease. RISK FACTORS Risk factors are things that make you more likely to develop a vitamin D deficiency. They include:  Being older.  Not being able to get outside very much.  Living in a nursing home.  Having had broken bones.  Having weak or thin bones (osteoporosis).  Having a disease or condition that changes how your body absorbs vitamin D.  Having dark skin.  Some medicines such as seizure medicines or steroids.  Being overweight or obese. SYMPTOMS Mild cases of vitamin D deficiency may not have any symptoms. If you have a very bad case, symptoms  may include:  Bone pain.  Muscle pain.  Falling often.  Broken bones caused by a minor injury, due to osteoporosis. DIAGNOSIS A blood test is the best way to tell if you have a vitamin D deficiency. TREATMENT Vitamin D deficiency can be treated in different ways. Treatment for vitamin D deficiency depends on what is causing it. Options include:  Taking vitamin D supplements.  Taking a calcium supplement. Your caregiver will suggest what dose is best for you. HOME CARE INSTRUCTIONS  Take any supplements that your caregiver prescribes. Follow the directions carefully. Take only the suggested amount.  Have your blood tested 2 months after you start taking supplements.  Eat foods that contain vitamin D. Healthy choices include:  Fortified dairy products, cereals, or juices. Fortified means vitamin D has been added to the food. Check the label on the package to be sure.  Fatty fish like salmon or trout.  Eggs.  Oysters.  Do not use a tanning bed.  Keep your weight at a healthy level. Lose weight if you need to.  Keep all follow-up appointments. Your caregiver will need to perform blood tests to make sure your vitamin D deficiency is going away. SEEK MEDICAL CARE IF:  You have any questions about your treatment.  You continue to have symptoms of vitamin D deficiency.  You have nausea or vomiting.  You are constipated.  You feel confused.  You have severe abdominal or back pain. MAKE   SURE YOU:  Understand these instructions.  Will watch your condition.  Will get help right away if you are not doing well or get worse. Document Released: 07/12/2011 Document Revised: 08/14/2012 Document Reviewed: 07/12/2011 [employer] Patient Information 2015 [employer]. This information is not intended to replace advice given to you by your health care provider. Make sure you discuss any questions you have with your health care provider.  

## 2014-08-26 NOTE — Assessment & Plan Note (Signed)
Abn xray-- CT was done-- essentially normal--d/w pt F/u onc

## 2014-08-26 NOTE — Progress Notes (Signed)
Pre visit review using our clinic review tool, if applicable. No additional management support is needed unless otherwise documented below in the visit note. 

## 2014-08-26 NOTE — Assessment & Plan Note (Signed)
rto for cpe and check labs Asa 81 mg daily

## 2014-09-09 ENCOUNTER — Encounter: Payer: Self-pay | Admitting: Internal Medicine

## 2014-10-03 ENCOUNTER — Encounter: Payer: Self-pay | Admitting: Internal Medicine

## 2014-10-03 NOTE — Telephone Encounter (Signed)
I called and cancelled Ct for June.

## 2014-10-04 ENCOUNTER — Other Ambulatory Visit: Payer: Self-pay | Admitting: Medical Oncology

## 2014-10-04 ENCOUNTER — Telehealth: Payer: Self-pay | Admitting: Internal Medicine

## 2014-10-04 NOTE — Telephone Encounter (Signed)
s.w. pt and advised on June appt

## 2014-10-15 ENCOUNTER — Telehealth: Payer: Self-pay

## 2014-10-15 NOTE — Telephone Encounter (Signed)
Pre visit completed

## 2014-10-17 ENCOUNTER — Ambulatory Visit (INDEPENDENT_AMBULATORY_CARE_PROVIDER_SITE_OTHER): Payer: BC Managed Care – PPO | Admitting: Family Medicine

## 2014-10-17 ENCOUNTER — Encounter: Payer: Self-pay | Admitting: Family Medicine

## 2014-10-17 VITALS — BP 136/83 | HR 72 | Temp 98.0°F | Ht 64.0 in | Wt 276.0 lb

## 2014-10-17 DIAGNOSIS — Z23 Encounter for immunization: Secondary | ICD-10-CM

## 2014-10-17 DIAGNOSIS — R7989 Other specified abnormal findings of blood chemistry: Secondary | ICD-10-CM

## 2014-10-17 DIAGNOSIS — Z0000 Encounter for general adult medical examination without abnormal findings: Secondary | ICD-10-CM | POA: Diagnosis not present

## 2014-10-17 DIAGNOSIS — R252 Cramp and spasm: Secondary | ICD-10-CM | POA: Diagnosis not present

## 2014-10-17 DIAGNOSIS — I10 Essential (primary) hypertension: Secondary | ICD-10-CM

## 2014-10-17 LAB — LIPID PANEL
CHOLESTEROL: 183 mg/dL (ref 0–200)
HDL: 42.7 mg/dL (ref >39.00)
LDL Cholesterol: 104 mg/dL — ABNORMAL HIGH (ref 0–99)
NonHDL: 140.3
Total CHOL/HDL Ratio: 4
Triglycerides: 180 mg/dL — ABNORMAL HIGH (ref 0.0–149.0)
VLDL: 36 mg/dL (ref 0.0–40.0)

## 2014-10-17 LAB — CBC WITH DIFFERENTIAL/PLATELET
BASOS ABS: 0 10*3/uL (ref 0.0–0.1)
Basophils Relative: 0.5 % (ref 0.0–3.0)
EOS ABS: 0.2 10*3/uL (ref 0.0–0.7)
Eosinophils Relative: 2.3 % (ref 0.0–5.0)
HEMATOCRIT: 40.2 % (ref 36.0–46.0)
Hemoglobin: 13.4 g/dL (ref 12.0–15.0)
Lymphocytes Relative: 26.2 % (ref 12.0–46.0)
Lymphs Abs: 1.9 10*3/uL (ref 0.7–4.0)
MCHC: 33.5 g/dL (ref 30.0–36.0)
MCV: 91.8 fl (ref 78.0–100.0)
MONO ABS: 0.5 10*3/uL (ref 0.1–1.0)
Monocytes Relative: 7.3 % (ref 3.0–12.0)
NEUTROS PCT: 63.7 % (ref 43.0–77.0)
Neutro Abs: 4.5 10*3/uL (ref 1.4–7.7)
PLATELETS: 222 10*3/uL (ref 150.0–400.0)
RBC: 4.38 Mil/uL (ref 3.87–5.11)
RDW: 13.5 % (ref 11.5–15.5)
WBC: 7.1 10*3/uL (ref 4.0–10.5)

## 2014-10-17 LAB — BASIC METABOLIC PANEL
BUN: 20 mg/dL (ref 6–23)
CALCIUM: 9.5 mg/dL (ref 8.4–10.5)
CO2: 33 meq/L — AB (ref 19–32)
CREATININE: 0.83 mg/dL (ref 0.40–1.20)
Chloride: 102 mEq/L (ref 96–112)
GFR: 73.33 mL/min (ref >60.00)
Glucose, Bld: 100 mg/dL — ABNORMAL HIGH (ref 70–99)
Potassium: 4.1 mEq/L (ref 3.5–5.1)
Sodium: 139 mEq/L (ref 135–145)

## 2014-10-17 LAB — HEPATIC FUNCTION PANEL
ALBUMIN: 4.2 g/dL (ref 3.5–5.2)
ALK PHOS: 66 U/L (ref 39–117)
ALT: 17 U/L (ref 0–35)
AST: 19 U/L (ref 0–37)
Bilirubin, Direct: 0.1 mg/dL (ref 0.0–0.3)
Total Bilirubin: 0.5 mg/dL (ref 0.2–1.2)
Total Protein: 7.5 g/dL (ref 6.0–8.3)

## 2014-10-17 LAB — MAGNESIUM: Magnesium: 1.9 mg/dL (ref 1.5–2.5)

## 2014-10-17 LAB — TSH: TSH: 1.93 u[IU]/mL (ref 0.35–4.50)

## 2014-10-17 NOTE — Progress Notes (Signed)
Subjective:     Elizabeth [name] is a 64 y.o. female and is here for a comprehensive physical exam. The patient reports no problems.  History   Social History  . Marital Status: Divorced    Spouse Name: N/A  . Number of Children: 1  . Years of Education: N/A   Occupational History  . School Nutritionist    Social History Main Topics  . Smoking status: Former Smoker    Types: Cigarettes    Quit date: 02/01/2005  . Smokeless tobacco: Never Used  . Alcohol Use: No  . Drug Use: No  . Sexual Activity: Yes   Other Topics Concern  . Not on file   Social History Narrative   Exercise--  no   Health Maintenance  Topic Date Due  . HIV Screening  08/26/2015 (Originally 10/14/1965)  . INFLUENZA VACCINE  12/02/2014  . MAMMOGRAM  12/12/2014  . PNA vac Low Risk Adult (2 of 2 - PPSV23) 10/17/2015  . COLONOSCOPY  06/28/2019  . TETANUS/TDAP  10/10/2021  . DEXA SCAN  Completed  . ZOSTAVAX  Completed    The following portions of the patient's history were reviewed and updated as appropriate:  She  has a past medical history of Hyperlipidemia; Hypertension; Low back pain; Vitamin D deficiency; and cancer of lung (6/10). She  does not have any pertinent problems on file. She  has past surgical history that includes Cholecystectomy; Tubal ligation; and Lung removal, partial. Her family history includes Cancer in her brother and mother; Heart disease in her father; Hypertension in her mother; Stroke in her mother. She  reports that she quit smoking about 9 years ago. Her smoking use included Cigarettes. She has never used smokeless tobacco. She reports that she does not drink alcohol or use illicit drugs. She has a current medication list which includes the following prescription(s): albuterol, aspirin, beclomethasone, cholecalciferol, clobetasol ointment, diovan hct, cvs leg cramps pain relief, levocetirizine, meloxicam, and vitamin d (ergocalciferol). Current Outpatient Prescriptions on File  Prior to Visit  Medication Sig Dispense Refill  . albuterol (PROAIR HFA) 108 (90 BASE) MCG/ACT inhaler Inhale 2 puffs into the lungs every 6 (six) hours as needed for wheezing or shortness of breath. 1 Inhaler 2  . beclomethasone (QVAR) 40 MCG/ACT inhaler Inhale 2 puffs into the lungs 2 (two) times daily. 1 Inhaler 12  . Cholecalciferol 4000 UNITS CAPS Take 4,000 capsules by mouth daily.    . clobetasol ointment (TEMOVATE) 0.05 %     . DIOVAN HCT 160-12.5 MG per tablet Take 1 tablet by mouth daily. 30 tablet 5  . Homeopathic Products ([facility] LEG CRAMPS PAIN RELIEF) TABS Take by mouth.    . meloxicam (MOBIC) 7.5 MG tablet TAKE 1 TABLET (7.5 MG TOTAL) BY MOUTH DAILY. 90 tablet 1  . Vitamin D, Ergocalciferol, (DRISDOL) 50000 UNITS CAPS capsule Take 1 capsule (50,000 Units total) by mouth every 7 (seven) days. 12 capsule 5   No current facility-administered medications on file prior to visit.   She is allergic to penicillins..  Review of Systems Review of Systems  Constitutional: Negative for activity change, appetite change and fatigue.  HENT: Negative for hearing loss, congestion, tinnitus and ear discharge.  dentist q40mEyes: Negative for visual disturbance--due Respiratory: Negative for cough, chest tightness and shortness of breath.   Cardiovascular: Negative for chest pain, palpitations and leg swelling.  Gastrointestinal: Negative for abdominal pain, diarrhea, constipation and abdominal distention.  Genitourinary: Negative for urgency, frequency, decreased urine volume and  difficulty urinating.  Musculoskeletal: Negative for back pain, arthralgias and gait problem.  Skin: Negative for color change, pallor and rash.  Neurological: Negative for dizziness, light-headedness, numbness and headaches.  Hematological: Negative for adenopathy. Does not bruise/bleed easily.  Psychiatric/Behavioral: Negative for suicidal ideas, confusion, sleep disturbance, self-injury, dysphoric mood, decreased  concentration and agitation.       Objective:    BP 136/83 mmHg  Pulse 72  Temp(Src) 98 F (36.7 C) (Oral)  Ht '5\' 4"'$  (1.626 m)  Wt 276 lb (125.193 kg)  BMI 47.35 kg/m2  SpO2 96% General appearance: alert, cooperative, appears stated age and no distress Head: Normocephalic, without obvious abnormality, atraumatic Eyes: conjunctivae/corneas clear. PERRL, EOM's intact. Fundi benign. Ears: normal TM's and external ear canals both ears Nose: Nares normal. Septum midline. Mucosa normal. No drainage or sinus tenderness. Throat: lips, mucosa, and tongue normal; teeth and gums normal Neck: no adenopathy, no carotid bruit, no JVD, supple, symmetrical, trachea midline and thyroid not enlarged, symmetric, no tenderness/mass/nodules Back: symmetric, no curvature. ROM normal. No CVA tenderness. Lungs: clear to auscultation bilaterally Breasts: gyn Heart: regular rate and rhythm, S1, S2 normal, no murmur, click, rub or gallop Abdomen: soft, non-tender; bowel sounds normal; no masses,  no organomegaly Pelvic: deferred--- gyn Extremities: extremities normal, atraumatic, no cyanosis or edema Pulses: 2+ and symmetric Skin: Skin color, texture, turgor normal. No rashes or lesions Lymph nodes: Cervical, supraclavicular, and axillary nodes normal. Neurologic: Alert and oriented X 3, normal strength and tone. Normal symmetric reflexes. Normal coordination and gait-- pt c/o achy legs but no calf pain Psych- no depression, no anxiety      Assessment:    Healthy female exam.       Plan:    see AVS con't meds See After Visit Summary for Counseling Recommendations   1. Need for pneumococcal vaccination   - Pneumococcal conjugate vaccine 13-valent  2. Low serum vitamin D   - Vitamin D 1,25 dihydroxy  3. Essential hypertension stable - Basic metabolic panel - CBC with Differential/Platelet - Hepatic function panel - Lipid panel - POCT urinalysis dipstick  4. Preventative health  care See avs - TSH  5. Cramp of both lower extremities Check labs Pt is taking magnesium.   She will also f/u with Dr Nelva Bush - --- ? If some of this is from her back Consider neurology - Magnesium

## 2014-10-17 NOTE — Patient Instructions (Signed)
Preventive Care for Adults A healthy lifestyle and preventive care can promote health and wellness. Preventive health guidelines for women include the following key practices.  A routine yearly physical is a good way to check with your health care provider about your health and preventive screening. It is a chance to share any concerns and updates on your health and to receive a thorough exam.  Visit your dentist for a routine exam and preventive care every 6 months. Brush your teeth twice a day and floss once a day. Good oral hygiene prevents tooth decay and gum disease.  The frequency of eye exams is based on your age, health, family medical history, use of contact lenses, and other factors. Follow your health care provider's recommendations for frequency of eye exams.  Eat a healthy diet. Foods like vegetables, fruits, whole grains, low-fat dairy products, and lean protein foods contain the nutrients you need without too many calories. Decrease your intake of foods high in solid fats, added sugars, and salt. Eat the right amount of calories for you.Get information about a proper diet from your health care provider, if necessary.  Regular physical exercise is one of the most important things you can do for your health. Most adults should get at least 150 minutes of moderate-intensity exercise (any activity that increases your heart rate and causes you to sweat) each week. In addition, most adults need muscle-strengthening exercises on 2 or more days a week.  Maintain a healthy weight. The body mass index (BMI) is a screening tool to identify possible weight problems. It provides an estimate of body fat based on height and weight. Your health care provider can find your BMI and can help you achieve or maintain a healthy weight.For adults 20 years and older:  A BMI below 18.5 is considered underweight.  A BMI of 18.5 to 24.9 is normal.  A BMI of 25 to 29.9 is considered overweight.  A BMI of  30 and above is considered obese.  Maintain normal blood lipids and cholesterol levels by exercising and minimizing your intake of saturated fat. Eat a balanced diet with plenty of fruit and vegetables. Blood tests for lipids and cholesterol should begin at age 76 and be repeated every 5 years. If your lipid or cholesterol levels are high, you are over 50, or you are at high risk for heart disease, you may need your cholesterol levels checked more frequently.Ongoing high lipid and cholesterol levels should be treated with medicines if diet and exercise are not working.  If you smoke, find out from your health care provider how to quit. If you do not use tobacco, do not start.  Lung cancer screening is recommended for adults aged 22-80 years who are at high risk for developing lung cancer because of a history of smoking. A yearly low-dose CT scan of the lungs is recommended for people who have at least a 30-pack-year history of smoking and are a current smoker or have quit within the past 15 years. A pack year of smoking is smoking an average of 1 pack of cigarettes a day for 1 year (for example: 1 pack a day for 30 years or 2 packs a day for 15 years). Yearly screening should continue until the smoker has stopped smoking for at least 15 years. Yearly screening should be stopped for people who develop a health problem that would prevent them from having lung cancer treatment.  If you are pregnant, do not drink alcohol. If you are breastfeeding,  be very cautious about drinking alcohol. If you are not pregnant and choose to drink alcohol, do not have more than 1 drink per day. One drink is considered to be 12 ounces (355 mL) of beer, 5 ounces (148 mL) of wine, or 1.5 ounces (44 mL) of liquor.  Avoid use of street drugs. Do not share needles with anyone. Ask for help if you need support or instructions about stopping the use of drugs.  High blood pressure causes heart disease and increases the risk of  stroke. Your blood pressure should be checked at least every 1 to 2 years. Ongoing high blood pressure should be treated with medicines if weight loss and exercise do not work.  If you are 75-52 years old, ask your health care provider if you should take aspirin to prevent strokes.  Diabetes screening involves taking a blood sample to check your fasting blood sugar level. This should be done once every 3 years, after age 15, if you are within normal weight and without risk factors for diabetes. Testing should be considered at a younger age or be carried out more frequently if you are overweight and have at least 1 risk factor for diabetes.  Breast cancer screening is essential preventive care for women. You should practice "breast self-awareness." This means understanding the normal appearance and feel of your breasts and may include breast self-examination. Any changes detected, no matter how small, should be reported to a health care provider. Women in their 58s and 30s should have a clinical breast exam (CBE) by a health care provider as part of a regular health exam every 1 to 3 years. After age 16, women should have a CBE every year. Starting at age 53, women should consider having a mammogram (breast X-ray test) every year. Women who have a family history of breast cancer should talk to their health care provider about genetic screening. Women at a high risk of breast cancer should talk to their health care providers about having an MRI and a mammogram every year.  Breast cancer gene (BRCA)-related cancer risk assessment is recommended for women who have family members with BRCA-related cancers. BRCA-related cancers include breast, ovarian, tubal, and peritoneal cancers. Having family members with these cancers may be associated with an increased risk for harmful changes (mutations) in the breast cancer genes BRCA1 and BRCA2. Results of the assessment will determine the need for genetic counseling and  BRCA1 and BRCA2 testing.  Routine pelvic exams to screen for cancer are no longer recommended for nonpregnant women who are considered low risk for cancer of the pelvic organs (ovaries, uterus, and vagina) and who do not have symptoms. Ask your health care provider if a screening pelvic exam is right for you.  If you have had past treatment for cervical cancer or a condition that could lead to cancer, you need Pap tests and screening for cancer for at least 20 years after your treatment. If Pap tests have been discontinued, your risk factors (such as having a new sexual partner) need to be reassessed to determine if screening should be resumed. Some women have medical problems that increase the chance of getting cervical cancer. In these cases, your health care provider may recommend more frequent screening and Pap tests.  The HPV test is an additional test that may be used for cervical cancer screening. The HPV test looks for the virus that can cause the cell changes on the cervix. The cells collected during the Pap test can be  tested for HPV. The HPV test could be used to screen women aged 30 years and older, and should be used in women of any age who have unclear Pap test results. After the age of 30, women should have HPV testing at the same frequency as a Pap test.  Colorectal cancer can be detected and often prevented. Most routine colorectal cancer screening begins at the age of 50 years and continues through age 75 years. However, your health care provider may recommend screening at an earlier age if you have risk factors for colon cancer. On a yearly basis, your health care provider may provide home test kits to check for hidden blood in the stool. Use of a small camera at the end of a tube, to directly examine the colon (sigmoidoscopy or colonoscopy), can detect the earliest forms of colorectal cancer. Talk to your health care provider about this at age 50, when routine screening begins. Direct  exam of the colon should be repeated every 5-10 years through age 75 years, unless early forms of pre-cancerous polyps or small growths are found.  People who are at an increased risk for hepatitis B should be screened for this virus. You are considered at high risk for hepatitis B if:  You were born in a country where hepatitis B occurs often. Talk with your health care provider about which countries are considered high risk.  Your parents were born in a high-risk country and you have not received a shot to protect against hepatitis B (hepatitis B vaccine).  You have HIV or AIDS.  You use needles to inject street drugs.  You live with, or have sex with, someone who has hepatitis B.  You get hemodialysis treatment.  You take certain medicines for conditions like cancer, organ transplantation, and autoimmune conditions.  Hepatitis C blood testing is recommended for all people born from 1945 through 1965 and any individual with known risks for hepatitis C.  Practice safe sex. Use condoms and avoid high-risk sexual practices to reduce the spread of sexually transmitted infections (STIs). STIs include gonorrhea, chlamydia, syphilis, trichomonas, herpes, HPV, and human immunodeficiency virus (HIV). Herpes, HIV, and HPV are viral illnesses that have no cure. They can result in disability, cancer, and death.  You should be screened for sexually transmitted illnesses (STIs) including gonorrhea and chlamydia if:  You are sexually active and are younger than 24 years.  You are older than 24 years and your health care provider tells you that you are at risk for this type of infection.  Your sexual activity has changed since you were last screened and you are at an increased risk for chlamydia or gonorrhea. Ask your health care provider if you are at risk.  If you are at risk of being infected with HIV, it is recommended that you take a prescription medicine daily to prevent HIV infection. This is  called preexposure prophylaxis (PrEP). You are considered at risk if:  You are a heterosexual woman, are sexually active, and are at increased risk for HIV infection.  You take drugs by injection.  You are sexually active with a partner who has HIV.  Talk with your health care provider about whether you are at high risk of being infected with HIV. If you choose to begin PrEP, you should first be tested for HIV. You should then be tested every 3 months for as long as you are taking PrEP.  Osteoporosis is a disease in which the bones lose minerals and strength   with aging. This can result in serious bone fractures or breaks. The risk of osteoporosis can be identified using a bone density scan. Women ages 65 years and over and women at risk for fractures or osteoporosis should discuss screening with their health care providers. Ask your health care provider whether you should take a calcium supplement or vitamin D to reduce the rate of osteoporosis.  Menopause can be associated with physical symptoms and risks. Hormone replacement therapy is available to decrease symptoms and risks. You should talk to your health care provider about whether hormone replacement therapy is right for you.  Use sunscreen. Apply sunscreen liberally and repeatedly throughout the day. You should seek shade when your shadow is shorter than you. Protect yourself by wearing long sleeves, pants, a wide-brimmed hat, and sunglasses year round, whenever you are outdoors.  Once a month, do a whole body skin exam, using a mirror to look at the skin on your back. Tell your health care provider of new moles, moles that have irregular borders, moles that are larger than a pencil eraser, or moles that have changed in shape or color.  Stay current with required vaccines (immunizations).  Influenza vaccine. All adults should be immunized every year.  Tetanus, diphtheria, and acellular pertussis (Td, Tdap) vaccine. Pregnant women should  receive 1 dose of Tdap vaccine during each pregnancy. The dose should be obtained regardless of the length of time since the last dose. Immunization is preferred during the 27th-36th week of gestation. An adult who has not previously received Tdap or who does not know her vaccine status should receive 1 dose of Tdap. This initial dose should be followed by tetanus and diphtheria toxoids (Td) booster doses every 10 years. Adults with an unknown or incomplete history of completing a 3-dose immunization series with Td-containing vaccines should begin or complete a primary immunization series including a Tdap dose. Adults should receive a Td booster every 10 years.  Varicella vaccine. An adult without evidence of immunity to varicella should receive 2 doses or a second dose if she has previously received 1 dose. Pregnant females who do not have evidence of immunity should receive the first dose after pregnancy. This first dose should be obtained before leaving the health care facility. The second dose should be obtained 4-8 weeks after the first dose.  Human papillomavirus (HPV) vaccine. Females aged 13-26 years who have not received the vaccine previously should obtain the 3-dose series. The vaccine is not recommended for use in pregnant females. However, pregnancy testing is not needed before receiving a dose. If a female is found to be pregnant after receiving a dose, no treatment is needed. In that case, the remaining doses should be delayed until after the pregnancy. Immunization is recommended for any person with an immunocompromised condition through the age of 26 years if she did not get any or all doses earlier. During the 3-dose series, the second dose should be obtained 4-8 weeks after the first dose. The third dose should be obtained 24 weeks after the first dose and 16 weeks after the second dose.  Zoster vaccine. One dose is recommended for adults aged 60 years or older unless certain conditions are  present.  Measles, mumps, and rubella (MMR) vaccine. Adults born before 1957 generally are considered immune to measles and mumps. Adults born in 1957 or later should have 1 or more doses of MMR vaccine unless there is a contraindication to the vaccine or there is laboratory evidence of immunity to   each of the three diseases. A routine second dose of MMR vaccine should be obtained at least 28 days after the first dose for students attending postsecondary schools, health care workers, or international travelers. People who received inactivated measles vaccine or an unknown type of measles vaccine during 1963-1967 should receive 2 doses of MMR vaccine. People who received inactivated mumps vaccine or an unknown type of mumps vaccine before 1979 and are at high risk for mumps infection should consider immunization with 2 doses of MMR vaccine. For females of childbearing age, rubella immunity should be determined. If there is no evidence of immunity, females who are not pregnant should be vaccinated. If there is no evidence of immunity, females who are pregnant should delay immunization until after pregnancy. Unvaccinated health care workers born before 1957 who lack laboratory evidence of measles, mumps, or rubella immunity or laboratory confirmation of disease should consider measles and mumps immunization with 2 doses of MMR vaccine or rubella immunization with 1 dose of MMR vaccine.  Pneumococcal 13-valent conjugate (PCV13) vaccine. When indicated, a person who is uncertain of her immunization history and has no record of immunization should receive the PCV13 vaccine. An adult aged 19 years or older who has certain medical conditions and has not been previously immunized should receive 1 dose of PCV13 vaccine. This PCV13 should be followed with a dose of pneumococcal polysaccharide (PPSV23) vaccine. The PPSV23 vaccine dose should be obtained at least 8 weeks after the dose of PCV13 vaccine. An adult aged 19  years or older who has certain medical conditions and previously received 1 or more doses of PPSV23 vaccine should receive 1 dose of PCV13. The PCV13 vaccine dose should be obtained 1 or more years after the last PPSV23 vaccine dose.  Pneumococcal polysaccharide (PPSV23) vaccine. When PCV13 is also indicated, PCV13 should be obtained first. All adults aged 65 years and older should be immunized. An adult younger than age 65 years who has certain medical conditions should be immunized. Any person who resides in a nursing home or long-term care facility should be immunized. An adult smoker should be immunized. People with an immunocompromised condition and certain other conditions should receive both PCV13 and PPSV23 vaccines. People with human immunodeficiency virus (HIV) infection should be immunized as soon as possible after diagnosis. Immunization during chemotherapy or radiation therapy should be avoided. Routine use of PPSV23 vaccine is not recommended for American Indians, [place] Natives, or people younger than 65 years unless there are medical conditions that require PPSV23 vaccine. When indicated, people who have unknown immunization and have no record of immunization should receive PPSV23 vaccine. One-time revaccination 5 years after the first dose of PPSV23 is recommended for people aged 19-64 years who have chronic kidney failure, nephrotic syndrome, asplenia, or immunocompromised conditions. People who received 1-2 doses of PPSV23 before age 65 years should receive another dose of PPSV23 vaccine at age 65 years or later if at least 5 years have passed since the previous dose. Doses of PPSV23 are not needed for people immunized with PPSV23 at or after age 65 years.  Meningococcal vaccine. Adults with asplenia or persistent complement component deficiencies should receive 2 doses of quadrivalent meningococcal conjugate (MenACWY-D) vaccine. The doses should be obtained at least 2 months apart.  Microbiologists working with certain meningococcal bacteria, military recruits, people at risk during an outbreak, and people who travel to or live in countries with a high rate of meningitis should be immunized. A first-year college student up through age   21 years who is living in a residence hall should receive a dose if she did not receive a dose on or after her 16th birthday. Adults who have certain high-risk conditions should receive one or more doses of vaccine.  Hepatitis A vaccine. Adults who wish to be protected from this disease, have certain high-risk conditions, work with hepatitis A-infected animals, work in hepatitis A research labs, or travel to or work in countries with a high rate of hepatitis A should be immunized. Adults who were previously unvaccinated and who anticipate close contact with an international adoptee during the first 60 days after arrival in the [place] from a country with a high rate of hepatitis A should be immunized.  Hepatitis B vaccine. Adults who wish to be protected from this disease, have certain high-risk conditions, may be exposed to blood or other infectious body fluids, are household contacts or sex partners of hepatitis B positive people, are clients or workers in certain care facilities, or travel to or work in countries with a high rate of hepatitis B should be immunized.  Haemophilus influenzae type b (Hib) vaccine. A previously unvaccinated person with asplenia or sickle cell disease or having a scheduled splenectomy should receive 1 dose of Hib vaccine. Regardless of previous immunization, a recipient of a hematopoietic stem cell transplant should receive a 3-dose series 6-12 months after her successful transplant. Hib vaccine is not recommended for adults with HIV infection. Preventive Services / Frequency Ages 64 to 68 years  Blood pressure check.** / Every 1 to 2 years.  Lipid and cholesterol check.** / Every 5 years beginning at age  22.  Clinical breast exam.** / Every 3 years for women in their 88s and 53s.  BRCA-related cancer risk assessment.** / For women who have family members with a BRCA-related cancer (breast, ovarian, tubal, or peritoneal cancers).  Pap test.** / Every 2 years from ages 90 through 51. Every 3 years starting at age 21 through age 56 or 3 with a history of 3 consecutive normal Pap tests.  HPV screening.** / Every 3 years from ages 24 through ages 1 to 46 with a history of 3 consecutive normal Pap tests.  Hepatitis C blood test.** / For any individual with known risks for hepatitis C.  Skin self-exam. / Monthly.  Influenza vaccine. / Every year.  Tetanus, diphtheria, and acellular pertussis (Tdap, Td) vaccine.** / Consult your health care provider. Pregnant women should receive 1 dose of Tdap vaccine during each pregnancy. 1 dose of Td every 10 years.  Varicella vaccine.** / Consult your health care provider. Pregnant females who do not have evidence of immunity should receive the first dose after pregnancy.  HPV vaccine. / 3 doses over 6 months, if 72 and younger. The vaccine is not recommended for use in pregnant females. However, pregnancy testing is not needed before receiving a dose.  Measles, mumps, rubella (MMR) vaccine.** / You need at least 1 dose of MMR if you were born in 1957 or later. You may also need a 2nd dose. For females of childbearing age, rubella immunity should be determined. If there is no evidence of immunity, females who are not pregnant should be vaccinated. If there is no evidence of immunity, females who are pregnant should delay immunization until after pregnancy.  Pneumococcal 13-valent conjugate (PCV13) vaccine.** / Consult your health care provider.  Pneumococcal polysaccharide (PPSV23) vaccine.** / 1 to 2 doses if you smoke cigarettes or if you have certain conditions.  Meningococcal vaccine.** /  1 dose if you are age 19 to 21 years and a first-year college  student living in a residence hall, or have one of several medical conditions, you need to get vaccinated against meningococcal disease. You may also need additional booster doses.  Hepatitis A vaccine.** / Consult your health care provider.  Hepatitis B vaccine.** / Consult your health care provider.  Haemophilus influenzae type b (Hib) vaccine.** / Consult your health care provider. Ages 40 to 64 years  Blood pressure check.** / Every 1 to 2 years.  Lipid and cholesterol check.** / Every 5 years beginning at age 20 years.  Lung cancer screening. / Every year if you are aged 55-80 years and have a 30-pack-year history of smoking and currently smoke or have quit within the past 15 years. Yearly screening is stopped once you have quit smoking for at least 15 years or develop a health problem that would prevent you from having lung cancer treatment.  Clinical breast exam.** / Every year after age 40 years.  BRCA-related cancer risk assessment.** / For women who have family members with a BRCA-related cancer (breast, ovarian, tubal, or peritoneal cancers).  Mammogram.** / Every year beginning at age 40 years and continuing for as long as you are in good health. Consult with your health care provider.  Pap test.** / Every 3 years starting at age 30 years through age 65 or 70 years with a history of 3 consecutive normal Pap tests.  HPV screening.** / Every 3 years from ages 30 years through ages 65 to 70 years with a history of 3 consecutive normal Pap tests.  Fecal occult blood test (FOBT) of stool. / Every year beginning at age 50 years and continuing until age 75 years. You may not need to do this test if you get a colonoscopy every 10 years.  Flexible sigmoidoscopy or colonoscopy.** / Every 5 years for a flexible sigmoidoscopy or every 10 years for a colonoscopy beginning at age 50 years and continuing until age 75 years.  Hepatitis C blood test.** / For all people born from 1945 through  1965 and any individual with known risks for hepatitis C.  Skin self-exam. / Monthly.  Influenza vaccine. / Every year.  Tetanus, diphtheria, and acellular pertussis (Tdap/Td) vaccine.** / Consult your health care provider. Pregnant women should receive 1 dose of Tdap vaccine during each pregnancy. 1 dose of Td every 10 years.  Varicella vaccine.** / Consult your health care provider. Pregnant females who do not have evidence of immunity should receive the first dose after pregnancy.  Zoster vaccine.** / 1 dose for adults aged 60 years or older.  Measles, mumps, rubella (MMR) vaccine.** / You need at least 1 dose of MMR if you were born in 1957 or later. You may also need a 2nd dose. For females of childbearing age, rubella immunity should be determined. If there is no evidence of immunity, females who are not pregnant should be vaccinated. If there is no evidence of immunity, females who are pregnant should delay immunization until after pregnancy.  Pneumococcal 13-valent conjugate (PCV13) vaccine.** / Consult your health care provider.  Pneumococcal polysaccharide (PPSV23) vaccine.** / 1 to 2 doses if you smoke cigarettes or if you have certain conditions.  Meningococcal vaccine.** / Consult your health care provider.  Hepatitis A vaccine.** / Consult your health care provider.  Hepatitis B vaccine.** / Consult your health care provider.  Haemophilus influenzae type b (Hib) vaccine.** / Consult your health care provider. Ages 65   years and over  Blood pressure check.** / Every 1 to 2 years.  Lipid and cholesterol check.** / Every 5 years beginning at age 22 years.  Lung cancer screening. / Every year if you are aged 73-80 years and have a 30-pack-year history of smoking and currently smoke or have quit within the past 15 years. Yearly screening is stopped once you have quit smoking for at least 15 years or develop a health problem that would prevent you from having lung cancer  treatment.  Clinical breast exam.** / Every year after age 4 years.  BRCA-related cancer risk assessment.** / For women who have family members with a BRCA-related cancer (breast, ovarian, tubal, or peritoneal cancers).  Mammogram.** / Every year beginning at age 40 years and continuing for as long as you are in good health. Consult with your health care provider.  Pap test.** / Every 3 years starting at age 9 years through age 34 or 91 years with 3 consecutive normal Pap tests. Testing can be stopped between 65 and 70 years with 3 consecutive normal Pap tests and no abnormal Pap or HPV tests in the past 10 years.  HPV screening.** / Every 3 years from ages 57 years through ages 64 or 45 years with a history of 3 consecutive normal Pap tests. Testing can be stopped between 65 and 70 years with 3 consecutive normal Pap tests and no abnormal Pap or HPV tests in the past 10 years.  Fecal occult blood test (FOBT) of stool. / Every year beginning at age 15 years and continuing until age 17 years. You may not need to do this test if you get a colonoscopy every 10 years.  Flexible sigmoidoscopy or colonoscopy.** / Every 5 years for a flexible sigmoidoscopy or every 10 years for a colonoscopy beginning at age 86 years and continuing until age 71 years.  Hepatitis C blood test.** / For all people born from [birth year] through 1966 and any individual with known risks for hepatitis C.  Osteoporosis screening.** / A one-time screening for women ages 83 years and over and women at risk for fractures or osteoporosis.  Skin self-exam. / Monthly.  Influenza vaccine. / Every year.  Tetanus, diphtheria, and acellular pertussis (Tdap/Td) vaccine.** / 1 dose of Td every 10 years.  Varicella vaccine.** / Consult your health care provider.  Zoster vaccine.** / 1 dose for adults aged 61 years or older.  Pneumococcal 13-valent conjugate (PCV13) vaccine.** / Consult your health care provider.  Pneumococcal  polysaccharide (PPSV23) vaccine.** / 1 dose for all adults aged 28 years and older.  Meningococcal vaccine.** / Consult your health care provider.  Hepatitis A vaccine.** / Consult your health care provider.  Hepatitis B vaccine.** / Consult your health care provider.  Haemophilus influenzae type b (Hib) vaccine.** / Consult your health care provider. ** Family history and personal history of risk and conditions may change your health care provider's recommendations. Document Released: 06/15/2001 Document Revised: 09/03/2013 Document Reviewed: 09/14/2010 [facility] Patient Information 2015 [place], [place]. This information is not intended to replace advice given to you by your health care provider. Make sure you discuss any questions you have with your health care provider.

## 2014-10-17 NOTE — Progress Notes (Signed)
Pre visit review using our clinic review tool, if applicable. No additional management support is needed unless otherwise documented below in the visit note. 

## 2014-10-18 LAB — POCT URINALYSIS DIPSTICK
BILIRUBIN UA: NEGATIVE
Blood, UA: NEGATIVE
Glucose, UA: NEGATIVE
Ketones, UA: NEGATIVE
Leukocytes, UA: NEGATIVE
Nitrite, UA: NEGATIVE
PH UA: 6.5
Protein, UA: NEGATIVE
Spec Grav, UA: 1.02
Urobilinogen, UA: 4

## 2014-10-19 ENCOUNTER — Encounter: Payer: Self-pay | Admitting: Internal Medicine

## 2014-10-21 ENCOUNTER — Telehealth: Payer: Self-pay | Admitting: Internal Medicine

## 2014-10-21 ENCOUNTER — Telehealth: Payer: Self-pay | Admitting: *Deleted

## 2014-10-21 LAB — VITAMIN D 1,25 DIHYDROXY
Vitamin D 1, 25 (OH)2 Total: 33 pg/mL (ref 18–72)
Vitamin D2 1, 25 (OH)2: 9 pg/mL
Vitamin D3 1, 25 (OH)2: 24 pg/mL

## 2014-10-21 NOTE — Telephone Encounter (Signed)
Reviewed pt concern with MD, pt will not need labs as they were drawn on 6/16. POF to scheduling, pt notified

## 2014-10-21 NOTE — Telephone Encounter (Signed)
cx appt per pof...per orders pof pt ok and aware

## 2014-10-22 ENCOUNTER — Other Ambulatory Visit: Payer: Self-pay

## 2014-10-22 ENCOUNTER — Ambulatory Visit (HOSPITAL_COMMUNITY): Payer: BC Managed Care – PPO

## 2014-10-23 ENCOUNTER — Encounter: Payer: Self-pay | Admitting: Family Medicine

## 2014-10-24 ENCOUNTER — Encounter: Payer: Self-pay | Admitting: Internal Medicine

## 2014-10-24 ENCOUNTER — Encounter: Payer: Self-pay | Admitting: Family Medicine

## 2014-10-24 ENCOUNTER — Telehealth: Payer: Self-pay | Admitting: Internal Medicine

## 2014-10-24 ENCOUNTER — Ambulatory Visit (HOSPITAL_BASED_OUTPATIENT_CLINIC_OR_DEPARTMENT_OTHER): Payer: BC Managed Care – PPO | Admitting: Internal Medicine

## 2014-10-24 ENCOUNTER — Other Ambulatory Visit: Payer: Self-pay

## 2014-10-24 VITALS — BP 146/64 | HR 72 | Temp 97.9°F | Resp 17 | Ht 64.0 in | Wt 279.1 lb

## 2014-10-24 DIAGNOSIS — C3411 Malignant neoplasm of upper lobe, right bronchus or lung: Secondary | ICD-10-CM

## 2014-10-24 DIAGNOSIS — Z85118 Personal history of other malignant neoplasm of bronchus and lung: Secondary | ICD-10-CM

## 2014-10-24 NOTE — Progress Notes (Signed)
[place] [phone]   [phone] OFFICE PROGRESS NOTE  [name], DO [address] [place] [address]  DIAGNOSIS: Stage IA (T1a, NX, MX) non-small cell lung cancer, squamous cell carcinoma diagnosed in May 2010.   PRIOR THERAPY: Status post right upper lobe superior segmentectomy under the care of Dr. Arlyce Dice on 10/18/2008.   CURRENT THERAPY: Observation.  INTERVAL HISTORY: Elizabeth [name] 64 y.o. female returns to the clinic today for annual followup visit. The patient has been on observation for the last 6 years. The patient is feeling fine today with no specific complaints. She denied having any significant chest pain, shortness breath, cough or hemoptysis. She has no weight loss and night sweats. The patient denied having any fever or chills. She had CT scan of the chest performed during her hospitalization in April 2016 for pneumonia and it showed no evidence for disease recurrence. The patient is here today for evaluation.  MEDICAL HISTORY: Past Medical History  Diagnosis Date  . Hyperlipidemia   . Hypertension   . Low back pain   . Vitamin D deficiency   . Hx of cancer of lung 6/10    Removed June 2010    ALLERGIES:  is allergic to penicillins.  MEDICATIONS:  Current Outpatient Prescriptions  Medication Sig Dispense Refill  . albuterol (PROAIR HFA) 108 (90 BASE) MCG/ACT inhaler Inhale 2 puffs into the lungs every 6 (six) hours as needed for wheezing or shortness of breath. 1 Inhaler 2  . aspirin 81 MG tablet Take 81 mg by mouth daily.    . beclomethasone (QVAR) 40 MCG/ACT inhaler Inhale 2 puffs into the lungs 2 (two) times daily. 1 Inhaler 12  . Cholecalciferol 4000 UNITS CAPS Take 4,000 capsules by mouth daily.    . clobetasol ointment (TEMOVATE) 0.05 %     . DIOVAN HCT 160-12.5 MG per tablet Take 1 tablet by mouth daily. 30 tablet 5  . Homeopathic Products ([employer] LEG CRAMPS PAIN RELIEF) TABS Take by mouth.      Marland Kitchen HYDROcodone-acetaminophen (NORCO/VICODIN) 5-325 MG per tablet     . levocetirizine (XYZAL) 5 MG tablet Take 1 tablet by mouth daily.    . meloxicam (MOBIC) 7.5 MG tablet TAKE 1 TABLET (7.5 MG TOTAL) BY MOUTH DAILY. 90 tablet 1  . Vitamin D, Ergocalciferol, (DRISDOL) 50000 UNITS CAPS capsule Take 1 capsule (50,000 Units total) by mouth every 7 (seven) days. 12 capsule 5   No current facility-administered medications for this visit.    SURGICAL HISTORY:  Past Surgical History  Procedure Laterality Date  . Cholecystectomy    . Tubal ligation    . Lung removal, partial      REVIEW OF SYSTEMS:  A comprehensive review of systems was negative.   PHYSICAL EXAMINATION: General appearance: alert, cooperative and no distress Head: Normocephalic, without obvious abnormality, atraumatic Neck: no adenopathy Lymph nodes: Cervical, supraclavicular, and axillary nodes normal. Resp: clear to auscultation bilaterally Cardio: regular rate and rhythm, S1, S2 normal, no murmur, click, rub or gallop GI: soft, non-tender; bowel sounds normal; no masses,  no organomegaly Extremities: extremities normal, atraumatic, no cyanosis or edema  ECOG PERFORMANCE STATUS: 0 - Asymptomatic  Blood pressure 146/64, pulse 72, temperature 97.9 F (36.6 C), temperature source Oral, resp. rate 17, height '5\' 4"'$  (1.626 m), weight 279 lb 1.6 oz (126.599 kg), SpO2 98 %.  LABORATORY DATA: Lab Results  Component Value Date   WBC 7.1 10/17/2014  HGB 13.4 10/17/2014   HCT 40.2 10/17/2014   MCV 91.8 10/17/2014   PLT 222.0 10/17/2014      Chemistry      Component Value Date/Time   NA 139 10/17/2014 1034   NA 143 10/23/2013 0843   NA 144 10/13/2011 1204   K 4.1 10/17/2014 1034   K 4.5 10/23/2013 0843   K 5.1* 10/13/2011 1204   CL 102 10/17/2014 1034   CL 104 10/02/2012 0949   CL 101 10/13/2011 1204   CO2 33* 10/17/2014 1034   CO2 29 10/23/2013 0843   CO2 33 10/13/2011 1204   BUN 20 10/17/2014 1034   BUN  22.3 10/23/2013 0843   BUN 16 10/13/2011 1204   CREATININE 0.83 10/17/2014 1034   CREATININE 1.0 10/23/2013 0843   CREATININE 1.0 10/13/2011 1204      Component Value Date/Time   CALCIUM 9.5 10/17/2014 1034   CALCIUM 9.3 10/23/2013 0843   CALCIUM 9.1 10/13/2011 1204   ALKPHOS 66 10/17/2014 1034   ALKPHOS 75 10/23/2013 0843   ALKPHOS 71 10/13/2011 1204   AST 19 10/17/2014 1034   AST 23 10/23/2013 0843   AST 31 10/13/2011 1204   ALT 17 10/17/2014 1034   ALT 21 10/23/2013 0843   ALT 31 10/13/2011 1204   BILITOT 0.5 10/17/2014 1034   BILITOT 0.35 10/23/2013 0843   BILITOT 0.60 10/13/2011 1204       RADIOGRAPHIC STUDIES: CLINICAL DATA: RIGHT lower lobe lung cancer 7 years ago. Pulmonary nodule on chest x-ray 08/22/2014.  EXAM: CT CHEST WITHOUT CONTRAST  TECHNIQUE: Multidetector CT imaging of the chest was performed following the standard protocol without IV contrast.  COMPARISON: Chest CT 10/23/2013.  FINDINGS: There is no corresponding pulmonary nodule identified in the LEFT mid lung. Buckling of the LEFT fifth and sixth ribs is incidentally noted, chronic. Coronary artery atherosclerosis is present. If office based assessment of coronary risk factors has not been performed, it is now recommended. Aortic and branch vessel atherosclerosis. Chronic pleural thickening at the RIGHT lung base. No axillary adenopathy. No mediastinal adenopathy. Hilar adenopathy poorly evaluated in the absence of IV contrast. No pericardial effusion. No pleural effusion.  Incidental imaging of the upper abdomen is within normal limits. Cholecystectomy clips are present. Abdominal aortic atherosclerosis. Surgical clips are present at the RIGHT hilum extending to the RIGHT lower lobe. RIGHT lower lobe scarring. Prominent extrapleural fat is present in the RIGHT upper chest associated with prior surgery. No aggressive osseous lesions are identified. Thoracic vertebral body height is  preserved.  IMPRESSION: 1. RIGHT upper lobe nodule on prior chest radiograph represents extrapleural fat associated with prior operation. 2. No LEFT lung pulmonary nodule is identified on today's exam. 3. Atherosclerosis and coronary artery disease. 4. Postsurgical changes at the RIGHT hilum compatible with partial lobectomy.   Electronically Signed  By: Dereck Ligas M.D.  On: 08/24/2014 21:59 ASSESSMENT AND PLAN: This is a very pleasant 64 years old white female with history of stage IA non-small cell lung cancer status post right upper lobe superior segmentectomy in June of 2010 and has been observation since that time with no evidence for disease recurrence.I discussed the scan results with the patient today. I recommended for her to continue on observation with repeat CT scan of the chest in one year.  I advised her to consult with her primary care physician regarding the findings on the CT scan of coronary artery disease. She was advised to call immediately if she has any concerning  symptoms in the interval. All questions were answered. The patient knows to call the clinic with any problems, questions or concerns. We can certainly see the patient much sooner if necessary.  Disclaimer: This note was dictated with voice recognition software. Similar sounding words can inadvertently be transcribed and may not be corrected upon review.

## 2014-10-24 NOTE — Telephone Encounter (Signed)
s.w. pt and advised on June 2017 appt.Marland KitchenMarland KitchenMarland KitchenMarland Kitchenpt ok and aware

## 2014-10-25 ENCOUNTER — Encounter: Payer: Self-pay | Admitting: Internal Medicine

## 2014-10-29 ENCOUNTER — Other Ambulatory Visit: Payer: Self-pay | Admitting: Family Medicine

## 2014-11-13 ENCOUNTER — Encounter: Payer: Self-pay | Admitting: Family Medicine

## 2014-12-05 ENCOUNTER — Ambulatory Visit (INDEPENDENT_AMBULATORY_CARE_PROVIDER_SITE_OTHER): Payer: BC Managed Care – PPO | Admitting: Family Medicine

## 2014-12-05 ENCOUNTER — Encounter: Payer: Self-pay | Admitting: Family Medicine

## 2014-12-05 VITALS — BP 129/79 | HR 80 | Temp 97.9°F | Resp 18 | Ht 64.0 in | Wt 283.0 lb

## 2014-12-05 DIAGNOSIS — R0683 Snoring: Secondary | ICD-10-CM | POA: Diagnosis not present

## 2014-12-05 DIAGNOSIS — G4710 Hypersomnia, unspecified: Secondary | ICD-10-CM | POA: Diagnosis not present

## 2014-12-05 DIAGNOSIS — I10 Essential (primary) hypertension: Secondary | ICD-10-CM | POA: Diagnosis not present

## 2014-12-05 DIAGNOSIS — R011 Cardiac murmur, unspecified: Secondary | ICD-10-CM | POA: Diagnosis not present

## 2014-12-05 DIAGNOSIS — R400 Somnolence: Secondary | ICD-10-CM

## 2014-12-05 MED ORDER — DIOVAN HCT 320-25 MG PO TABS: 1.0000 | ORAL_TABLET | Freq: Every day | ORAL | Status: DC

## 2014-12-05 MED ORDER — DIOVAN HCT 320-25 MG PO TABS
1.0000 | ORAL_TABLET | Freq: Every day | ORAL | Status: DC
Start: 2014-12-05 — End: 2014-12-05

## 2014-12-05 NOTE — Progress Notes (Signed)
Pre visit review using our clinic review tool, if applicable. No additional management support is needed unless otherwise documented below in the visit note. 

## 2014-12-05 NOTE — Patient Instructions (Signed)
Heart Murmur A heart murmur is an extra sound heard by your health care provider when listening to your heart with a device called a stethoscope. The sound comes from turbulence when blood flows through the heart and may be a "hum" or "whoosh" sound heard when the heart beats. There are two types of heart murmurs:  Innocent murmurs. Most people with this type of heart murmur do not have a heart problem. Many children have innocent heart murmurs. Your health care provider may suggest some basic testing to know whether your murmur is an innocent murmur. If an innocent heart murmur is found, there is no need for further tests or treatment and no need to restrict activities or stop playing sports.  Abnormal murmurs. These types of murmurs can occur in children and adults. In children, abnormal heart murmurs are typically caused from heart defects that are present at birth (congenital). In adults, abnormal murmurs are usually from heart valve problems caused by disease, infection, or aging. CAUSES  All heart murmurs are a result of an issue with your heart valves. Normally, these valves open to let blood flow through or out of your heart and then shut to keep it from flowing backward. If they do not work properly, you could have:  Regurgitation--When blood leaks back through the valve in the wrong direction.  Mitral valve prolapse--When the mitral valve of the heart has a loose flap and does not close tightly.  Stenosis--When the valve does not open enough and blocks blood flow. SIGNS AND SYMPTOMS  Innocent murmurs do not cause symptoms, and many people with abnormal murmurs may or may not have symptoms. If symptoms do develop, they may include:  Shortness of breath.  Blue coloring of the skin, especially on the fingertips.  Chest pain.  Palpitations, or feeling a fluttering or skipped heartbeat.  Fainting.  Persistent cough.  Getting tired much faster than expected. DIAGNOSIS  A heart  murmur might be heard during a sports physical or during any type of examination. When a murmur is heard, it may suggest a possible problem. When this happens, your health care provider may ask you to see a heart specialist (cardiologist). You may also be asked to have one or more heart tests. In these cases, testing may vary depending on what your health care provider heard. Tests for a heart murmur may include:  Electrocardiogram.  Echocardiogram.  MRI. For children and adults who have an abnormal heart murmur and want to play sports, it is important to complete testing, review test results, and receive recommendations from your health care provider. If heart disease is present, it may not be safe to play. TREATMENT  Innocent murmurs require no treatment or activity restriction. If an abnormal murmur represents a problem with the heart, treatment will depend on the exact nature of the problem. In these cases, medicine or surgery may be needed to treat the problem. HOME CARE INSTRUCTIONS If you want to participate in sports or other types of strenuous physical activity, it is important to discuss this first with your health care provider. If the murmur represents a problem with the heart and you choose to participate in sports, there is a small chance that a serious problem (including sudden death) could result.  SEEK MEDICAL CARE IF:   You feel that your symptoms are slowly worsening.  You develop any new symptoms that cause concern.  You feel that you are having side effects from any medicines prescribed. SEEK IMMEDIATE MEDICAL CARE IF:  You develop chest pain.  You have shortness of breath.  You notice that your heart beats irregularly often enough to cause you to worry.  You have fainting spells.  Your symptoms suddenly get worse. Document Released: 05/27/2004 Document Revised: 04/24/2013 Document Reviewed: 12/25/2012 [facility] Patient Information 2015 [place], [place]. This  information is not intended to replace advice given to you by your health care provider. Make sure you discuss any questions you have with your health care provider.

## 2014-12-05 NOTE — Progress Notes (Signed)
Patient ID: Elizabeth [name], female    DOB: November 10, 1950  Age: 64 y.o. MRN: [id number]    Subjective:  Subjective HPI Elizabeth [name] presents for low ext swelling L > R ,  Sob no worse than usual she says , no chest pain.   Pt feels the swellling began when she switched from brand diovan to generic and would like the brand back.    Review of Systems  Constitutional: Negative for diaphoresis, appetite change, fatigue and unexpected weight change.  Eyes: Negative for pain, redness and visual disturbance.  Respiratory: Negative for cough, chest tightness, shortness of breath and wheezing.   Cardiovascular: Positive for leg swelling. Negative for chest pain and palpitations.  Endocrine: Negative for cold intolerance, heat intolerance, polydipsia, polyphagia and polyuria.  Genitourinary: Negative for dysuria, frequency and difficulty urinating.  Neurological: Negative for dizziness, light-headedness, numbness and headaches.    History Past Medical History  Diagnosis Date  . Hyperlipidemia   . Hypertension   . Low back pain   . Vitamin D deficiency   . Hx of cancer of lung 6/10    Removed June 2010    She has past surgical history that includes Cholecystectomy; Tubal ligation; and Lung removal, partial.   Her family history includes Cancer in her brother and mother; Heart disease in her father; Hypertension in her mother; Stroke in her mother.She reports that she quit smoking about 9 years ago. Her smoking use included Cigarettes. She has never used smokeless tobacco. She reports that she does not drink alcohol or use illicit drugs.  Current Outpatient Prescriptions on File Prior to Visit  Medication Sig Dispense Refill  . albuterol (PROAIR HFA) 108 (90 BASE) MCG/ACT inhaler Inhale 2 puffs into the lungs every 6 (six) hours as needed for wheezing or shortness of breath. 1 Inhaler 2  . aspirin 81 MG tablet Take 81 mg by mouth daily.    . beclomethasone (QVAR) 40 MCG/ACT inhaler Inhale 2 puffs  into the lungs 2 (two) times daily. 1 Inhaler 12  . Cholecalciferol 4000 UNITS CAPS Take 4,000 capsules by mouth daily.    . Homeopathic Products ([employer] LEG CRAMPS PAIN RELIEF) TABS Take by mouth.    . levocetirizine (XYZAL) 5 MG tablet Take 1 tablet by mouth daily.    . Vitamin D, Ergocalciferol, (DRISDOL) 50000 UNITS CAPS capsule Take 1 capsule (50,000 Units total) by mouth every 7 (seven) days. 12 capsule 5  . clobetasol ointment (TEMOVATE) 0.05 %      No current facility-administered medications on file prior to visit.     Objective:  Objective Physical Exam  Constitutional: She is oriented to person, place, and time. She appears well-developed and well-nourished.  HENT:  Head: Normocephalic and atraumatic.  Eyes: Conjunctivae and EOM are normal.  Neck: Normal range of motion. Neck supple. No JVD present. Carotid bruit is not present. No thyromegaly present.  Cardiovascular: Normal rate and regular rhythm.   Murmur heard. Pulmonary/Chest: Effort normal and breath sounds normal. No respiratory distress. She has no wheezes. She has no rales. She exhibits no tenderness.  Musculoskeletal: She exhibits edema.  Tr pitting edema b/l L > R  Neurological: She is alert and oriented to person, place, and time.  Psychiatric: She has a normal mood and affect.   BP 129/79 mmHg  Pulse 80  Temp(Src) 97.9 F (36.6 C)  Resp 18  Ht '5\' 4"'$  (1.626 m)  Wt 283 lb (128.368 kg)  BMI 48.55 kg/m2  SpO2 96% Wt  Readings from Last 3 Encounters:  12/05/14 283 lb (128.368 kg)  10/24/14 279 lb 1.6 oz (126.599 kg)  10/17/14 276 lb (125.193 kg)     Lab Results  Component Value Date   WBC 7.1 10/17/2014   HGB 13.4 10/17/2014   HCT 40.2 10/17/2014   PLT 222.0 10/17/2014   GLUCOSE 100* 10/17/2014   CHOL 183 10/17/2014   TRIG 180.0* 10/17/2014   HDL 42.70 10/17/2014   LDLCALC 104* 10/17/2014   ALT 17 10/17/2014   AST 19 10/17/2014   NA 139 10/17/2014   K 4.1 10/17/2014   CL 102 10/17/2014    CREATININE 0.83 10/17/2014   BUN 20 10/17/2014   CO2 33* 10/17/2014   TSH 1.93 10/17/2014   INR 1.0 10/16/2008   HGBA1C 5.7 06/21/2011    Ct Chest Wo Contrast  08/24/2014   CLINICAL DATA:  RIGHT lower lobe lung cancer 7 years ago. Pulmonary nodule on chest x-ray 08/22/2014.  EXAM: CT CHEST WITHOUT CONTRAST  TECHNIQUE: Multidetector CT imaging of the chest was performed following the standard protocol without IV contrast.  COMPARISON:  Chest CT 10/23/2013.  FINDINGS: There is no corresponding pulmonary nodule identified in the LEFT mid lung. Buckling of the LEFT fifth and sixth ribs is incidentally noted, chronic. Coronary artery atherosclerosis is present. If office based assessment of coronary risk factors has not been performed, it is now recommended. Aortic and branch vessel atherosclerosis. Chronic pleural thickening at the RIGHT lung base. No axillary adenopathy. No mediastinal adenopathy. Hilar adenopathy poorly evaluated in the absence of IV contrast. No pericardial effusion. No pleural effusion.  Incidental imaging of the upper abdomen is within normal limits. Cholecystectomy clips are present. Abdominal aortic atherosclerosis. Surgical clips are present at the RIGHT hilum extending to the RIGHT lower lobe. RIGHT lower lobe scarring. Prominent extrapleural fat is present in the RIGHT upper chest associated with prior surgery. No aggressive osseous lesions are identified. Thoracic vertebral body height is preserved.  IMPRESSION: 1. RIGHT upper lobe nodule on prior chest radiograph represents extrapleural fat associated with prior operation. 2. No LEFT lung pulmonary nodule is identified on today's exam. 3. Atherosclerosis and coronary artery disease. 4. Postsurgical changes at the RIGHT hilum compatible with partial lobectomy.   Electronically Signed   By: Dereck Ligas M.D.   On: 08/24/2014 21:59     Assessment & Plan:  Plan I have discontinued [name]'s meloxicam and  HYDROcodone-acetaminophen. I am also having her maintain her clobetasol ointment, [employer] LEG CRAMPS PAIN RELIEF, Cholecalciferol, beclomethasone, albuterol, Vitamin D (Ergocalciferol), aspirin, levocetirizine, and DIOVAN HCT.  Meds ordered this encounter  Medications  . DISCONTD: DIOVAN HCT 320-25 MG per tablet    Sig: Take 1 tablet by mouth daily.    Dispense:  90 tablet    Refill:  3    Brand medically necessary  . DISCONTD: DIOVAN HCT 320-25 MG per tablet    Sig: Take 1 tablet by mouth daily.    Dispense:  90 tablet    Refill:  3    Brand medically necessary  . DIOVAN HCT 320-25 MG per tablet    Sig: Take 1 tablet by mouth daily.    Dispense:  90 tablet    Refill:  3    Brand medically necessary    Problem List Items Addressed This Visit    Snoring    Refer to pulmonary R/o osa       Relevant Orders   Ambulatory referral to Pulmonology  Other Visit Diagnoses    Newly recognized murmur    -  Primary    Relevant Orders    ECHOCARDIOGRAM COMPLETE    Daytime somnolence        Relevant Orders    Ambulatory referral to Pulmonology    Essential hypertension        Relevant Medications    DIOVAN HCT 320-25 MG per tablet       Follow-up: Return in about 3 weeks (around 12/26/2014) for hypertension.  [name], DO

## 2014-12-06 DIAGNOSIS — R0683 Snoring: Secondary | ICD-10-CM | POA: Insufficient documentation

## 2014-12-06 NOTE — Assessment & Plan Note (Signed)
Refer to pulmonary R/o osa

## 2014-12-09 ENCOUNTER — Telehealth: Payer: Self-pay | Admitting: *Deleted

## 2014-12-09 NOTE — Telephone Encounter (Signed)
Prior authorization for Diovan approved effective 11/09/14 - 12/09/15. JG//CMA

## 2014-12-11 ENCOUNTER — Ambulatory Visit (HOSPITAL_BASED_OUTPATIENT_CLINIC_OR_DEPARTMENT_OTHER)
Admission: RE | Admit: 2014-12-11 | Discharge: 2014-12-11 | Disposition: A | Discharge status: Home / Self Care | Payer: BC Managed Care – PPO | Source: Ambulatory Visit | Attending: Family Medicine | Admitting: Family Medicine

## 2014-12-11 DIAGNOSIS — I517 Cardiomegaly: Secondary | ICD-10-CM | POA: Diagnosis not present

## 2014-12-11 DIAGNOSIS — I10 Essential (primary) hypertension: Secondary | ICD-10-CM | POA: Insufficient documentation

## 2014-12-11 DIAGNOSIS — R011 Cardiac murmur, unspecified: Secondary | ICD-10-CM

## 2014-12-11 DIAGNOSIS — E785 Hyperlipidemia, unspecified: Secondary | ICD-10-CM | POA: Diagnosis not present

## 2014-12-11 DIAGNOSIS — I350 Nonrheumatic aortic (valve) stenosis: Secondary | ICD-10-CM | POA: Diagnosis not present

## 2014-12-11 NOTE — Progress Notes (Signed)
*  PRELIMINARY RESULTS* Echocardiogram 2D Echocardiogram has been performed.  Leavy Cella 12/11/2014, 3:44 PM

## 2014-12-12 ENCOUNTER — Other Ambulatory Visit: Payer: Self-pay | Admitting: Gynecology

## 2014-12-12 ENCOUNTER — Telehealth: Payer: Self-pay | Admitting: Family Medicine

## 2014-12-12 ENCOUNTER — Other Ambulatory Visit: Payer: Self-pay

## 2014-12-12 DIAGNOSIS — I5189 Other ill-defined heart diseases: Secondary | ICD-10-CM

## 2014-12-12 DIAGNOSIS — R079 Chest pain, unspecified: Secondary | ICD-10-CM

## 2014-12-12 NOTE — Telephone Encounter (Signed)
°  Relation to TV:IFXG Call back number:[phone] Pharmacy:  Reason for call: pt states she received her results from her echo on my chart and needs to speak with someone regarding the results Pt would like a call back to pt is nervous

## 2014-12-12 NOTE — Telephone Encounter (Signed)
Diastolic dysfunction-- moderate stenosis (murmur) bp control important If symptoms persist-- refer to cards   Discussed with patient who stated she has chest pain all of the time, she said she would like to see Cardiology. She is taking 3 baby asa daily and I advised not necessary. She said she takes it due to the chest pain and I advised if the chest pain is that often try Ranitidine but she wants to be seen by a physician. I advised she needs to go to the ED if she starts to experience chest pain, SOB, arm pain and sweats. She verbalized understanding, said she has chest pain so often that she would be in the hospital daily, I advised the choice is hers but I have to make her aware of the precautions, she verbalized understanding and I made her ware to call if she has anymore questions or concerns.   KP

## 2014-12-13 LAB — CYTOLOGY - PAP

## 2014-12-30 ENCOUNTER — Ambulatory Visit (INDEPENDENT_AMBULATORY_CARE_PROVIDER_SITE_OTHER): Payer: BC Managed Care – PPO | Admitting: Family Medicine

## 2014-12-30 ENCOUNTER — Encounter: Payer: Self-pay | Admitting: Family Medicine

## 2014-12-30 VITALS — BP 122/68 | HR 81 | Temp 98.0°F | Wt 281.4 lb

## 2014-12-30 DIAGNOSIS — I10 Essential (primary) hypertension: Secondary | ICD-10-CM | POA: Diagnosis not present

## 2014-12-30 DIAGNOSIS — R609 Edema, unspecified: Secondary | ICD-10-CM

## 2014-12-30 MED ORDER — FUROSEMIDE 20 MG PO TABS: 20.0000 mg | ORAL_TABLET | Freq: Every day | ORAL | Status: DC

## 2014-12-30 MED ORDER — VALSARTAN 320 MG PO TABS: 320.0000 mg | ORAL_TABLET | Freq: Every day | ORAL | Status: DC

## 2014-12-30 NOTE — Patient Instructions (Addendum)
Hypertension Hypertension, commonly called high blood pressure, is when the force of blood pumping through your arteries is too strong. Your arteries are the blood vessels that carry blood from your heart throughout your body. A blood pressure reading consists of a higher number over a lower number, such as 110/72. The higher number (systolic) is the pressure inside your arteries when your heart pumps. The lower number (diastolic) is the pressure inside your arteries when your heart relaxes. Ideally you want your blood pressure below 120/80. Hypertension forces your heart to work harder to pump blood. Your arteries may become narrow or stiff. Having hypertension puts you at risk for heart disease, stroke, and other problems.  RISK FACTORS Some risk factors for high blood pressure are controllable. Others are not.  Risk factors you cannot control include:   Race. You may be at higher risk if you are African American.  Age. Risk increases with age.  Gender. Men are at higher risk than women before age 45 years. After age 65, women are at higher risk than men. Risk factors you can control include:  Not getting enough exercise or physical activity.  Being overweight.  Getting too much fat, sugar, calories, or salt in your diet.  Drinking too much alcohol. SIGNS AND SYMPTOMS Hypertension does not usually cause signs or symptoms. Extremely high blood pressure (hypertensive crisis) may cause headache, anxiety, shortness of breath, and nosebleed. DIAGNOSIS  To check if you have hypertension, your health care provider will measure your blood pressure while you are seated, with your arm held at the level of your heart. It should be measured at least twice using the same arm. Certain conditions can cause a difference in blood pressure between your right and left arms. A blood pressure reading that is higher than normal on one occasion does not mean that you need treatment. If one blood pressure reading  is high, ask your health care provider about having it checked again. TREATMENT  Treating high blood pressure includes making lifestyle changes and possibly taking medicine. Living a healthy lifestyle can help lower high blood pressure. You may need to change some of your habits. Lifestyle changes may include:  Following the DASH diet. This diet is high in fruits, vegetables, and whole grains. It is low in salt, red meat, and added sugars.  Getting at least 2 hours of brisk physical activity every week.  Losing weight if necessary.  Not smoking.  Limiting alcoholic beverages.  Learning ways to reduce stress. If lifestyle changes are not enough to get your blood pressure under control, your health care provider may prescribe medicine. You may need to take more than one. Work closely with your health care provider to understand the risks and benefits. HOME CARE INSTRUCTIONS  Have your blood pressure rechecked as directed by your health care provider.   Take medicines only as directed by your health care provider. Follow the directions carefully. Blood pressure medicines must be taken as prescribed. The medicine does not work as well when you skip doses. Skipping doses also puts you at risk for problems.   Do not smoke.   Monitor your blood pressure at home as directed by your health care provider. SEEK MEDICAL CARE IF:   You think you are having a reaction to medicines taken.  You have recurrent headaches or feel dizzy.  You have swelling in your ankles.  You have trouble with your vision. SEEK IMMEDIATE MEDICAL CARE IF:  You develop a severe headache or confusion.    You have unusual weakness, numbness, or feel faint.  You have severe chest or abdominal pain.  You vomit repeatedly.  You have trouble breathing. MAKE SURE YOU:   Understand these instructions.  Will watch your condition.  Will get help right away if you are not doing well or get worse. Document  Released: 04/19/2005 Document Revised: 09/03/2013 Document Reviewed: 02/09/2013 [facility] Patient Information 2015 [place], [place]. This information is not intended to replace advice given to you by your health care provider. Make sure you discuss any questions you have with your health care provider.   Tarsal Coalition Tarsal coalition is a condition that develops before birth. In this condition, an incomplete separation exists between the hind foot bones (tarsal bones). Tarsal coalition often lacks symptoms. However, it may cause problems during the teenage or early adult years. SYMPTOMS   Recurring ankle sprains.  Rigid, flat foot (or feet).  Foot fatigue.  Pain in the hind foot that gets worse with activity. CAUSES  During a baby's development, the tarsal bones fail to separate completely from each other. RISK INCREASES WITH: Family history of tarsal coalition. PREVENTION There are no known preventive measures. PROGNOSIS  If left untreated, adults often develop arthritis of the joints between the tarsal bones in the foot. If treated before arthritis develops, you have a good chance of a full return to activity, with some foot stiffness. RELATED COMPLICATIONS   Arthritis of the foot and ankle, due to increased stress on other joints.  Persistent and recurring foot and ankle pain.  Recurring ankle sprains. TREATMENT  Treatment first involves the use of ice and medicine to reduce pain and inflammation. The foot may be restrained to reduce pain and swelling. Arch supports (orthotics) may be used to reduce pressure on the joints between the tarsal bones. Corticosteroid injections may be recommended to reduce inflammation. If non-surgical treatment is unsuccessful, surgery may be needed to prevent arthritis. Surgery may involve removing the bony bridge (coalition) between the tarsal bones, or fusion of the tarsal joints (eliminating motion between the joints). MEDICATION   If pain  medicine is needed, nonsteroidal anti-inflammatory medicines (aspirin and ibuprofen), or other minor pain relievers (acetaminophen), are often recommended.  Do not take pain medicine for 7 days before surgery.  Stronger pain relievers may be prescribed. Use only as directed and only as much as you need.  Injections of corticosteroids may be given to reduce inflammation. However, they can only be given a certain number of times. These injections may increase your risk of developing arthritis. HEAT AND COLD  Cold treatment (icing) relieves pain and reduces inflammation. Cold treatment should be applied for 10 to 15 minutes every 2 to 3 hours, and immediately after activity that aggravates your symptoms. Use ice packs or an ice massage.  Heat treatment may be used before performing stretching and strengthening activities prescribed by your caregiver, physical therapist, or athletic trainer. Use a heat pack or a warm water soak. SEEK MEDICAL CARE IF:   Pain, tenderness, or swelling worsens, despite treatment.  You experience pain, numbness, or coldness in the foot.  Blue, gray, or dark color appears in the toenails.  Any of the following occur after surgery: fever, increased pain, swelling, redness, drainage of fluids, or bleeding in the affected area.  New, unexplained symptoms develop. (Drugs used in treatment may produce side effects.) Document Released: 04/19/2005 Document Revised: 07/12/2011 Document Reviewed: 08/01/2008 [facility] Patient Information 2015 [place], [place]. This information is not intended to replace advice given to  you by your health care provider. Make sure you discuss any questions you have with your health care provider.

## 2014-12-30 NOTE — Progress Notes (Signed)
kkPatient ID: Elizabeth [name], female   DOB: [birth year], 64 y.o.   MRN: [id number]    SubjectiveL    Patient ID: Elizabeth [name], female    DOB: 1950/11/06, 64 y.o.   MRN: [id number]  Chief Complaint  Patient presents with  . Hypertension    BP follow up  . Edema    left foot edema and pain that has gotten worst over the last 4 days    HPI Patient is in today for F/U HTN AND L FOOT EDEMA. Swelling is worsening.   Past Medical History  Diagnosis Date  . Hyperlipidemia   . Hypertension   . Low back pain   . Vitamin D deficiency   . Hx of cancer of lung 6/10    Removed June 2010    Past Surgical History  Procedure Laterality Date  . Cholecystectomy    . Tubal ligation    . Lung removal, partial      Family History  Problem Relation Age of Onset  . Hypertension Mother   . Stroke Mother   . Cancer Mother     lung  . Heart disease Father     cabg  . Cancer Brother     skin    Social History   Social History  . Marital Status: Divorced    Spouse Name: N/A  . Number of Children: 1  . Years of Education: N/A   Occupational History  . School Nutritionist    Social History Main Topics  . Smoking status: Former Smoker    Types: Cigarettes    Quit date: 02/01/2005  . Smokeless tobacco: Never Used  . Alcohol Use: No  . Drug Use: No  . Sexual Activity: Yes   Other Topics Concern  . Not on file   Social History Narrative   Exercise--  no    Outpatient Prescriptions Prior to Visit  Medication Sig Dispense Refill  . aspirin 81 MG tablet Take 81 mg by mouth daily.    . Cholecalciferol 4000 UNITS CAPS Take 4,000 capsules by mouth daily.    . Homeopathic Products ([employer] LEG CRAMPS PAIN RELIEF) TABS Take by mouth.    . levocetirizine (XYZAL) 5 MG tablet Take 1 tablet by mouth daily.    . Vitamin D, Ergocalciferol, (DRISDOL) 50000 UNITS CAPS capsule Take 1 capsule (50,000 Units total) by mouth every 7 (seven) days. 12 capsule 5  . DIOVAN HCT 320-25 MG per tablet Take 1  tablet by mouth daily. 90 tablet 3  . albuterol (PROAIR HFA) 108 (90 BASE) MCG/ACT inhaler Inhale 2 puffs into the lungs every 6 (six) hours as needed for wheezing or shortness of breath. 1 Inhaler 2  . beclomethasone (QVAR) 40 MCG/ACT inhaler Inhale 2 puffs into the lungs 2 (two) times daily. 1 Inhaler 12  . clobetasol ointment (TEMOVATE) 0.05 %      No facility-administered medications prior to visit.    Allergies  Allergen Reactions  . Penicillins Other (See Comments)    Fever     Review of Systems  Constitutional: Negative for fever and malaise/fatigue.  HENT: Negative for congestion.   Eyes: Negative for discharge.  Respiratory: Negative for shortness of breath.   Cardiovascular: Positive for leg swelling. Negative for chest pain and palpitations.  Gastrointestinal: Negative for nausea and abdominal pain.  Genitourinary: Negative for dysuria.  Musculoskeletal: Negative for falls.  Skin: Negative for rash.  Neurological: Negative for loss of consciousness and headaches.  Endo/Heme/Allergies: Negative for environmental  allergies.  Psychiatric/Behavioral: Negative for depression. The patient is not nervous/anxious.        Objective:    Physical Exam  Constitutional: She is oriented to person, place, and time. She appears well-developed and well-nourished.  HENT:  Head: Normocephalic and atraumatic.  Eyes: Conjunctivae and EOM are normal.  Neck: Normal range of motion. Neck supple. No JVD present. Carotid bruit is not present. No thyromegaly present.  Cardiovascular: Normal rate, regular rhythm and normal heart sounds.   No murmur heard. Pulmonary/Chest: Effort normal and breath sounds normal. No respiratory distress. She has no wheezes. She has no rales. She exhibits no tenderness.  Musculoskeletal: She exhibits no edema.  Neurological: She is alert and oriented to person, place, and time.  Psychiatric: She has a normal mood and affect. Her behavior is normal.    BP  122/68 mmHg  Pulse 81  Temp(Src) 98 F (36.7 C) (Oral)  Wt 281 lb 6.4 oz (127.642 kg)  SpO2 97% Wt Readings from Last 3 Encounters:  01/02/15 278 lb (126.1 kg)  12/30/14 281 lb 6.4 oz (127.642 kg)  12/05/14 283 lb (128.368 kg)     Lab Results  Component Value Date   WBC 7.1 10/17/2014   HGB 13.4 10/17/2014   HCT 40.2 10/17/2014   PLT 222.0 10/17/2014   GLUCOSE 100* 10/17/2014   CHOL 183 10/17/2014   TRIG 180.0* 10/17/2014   HDL 42.70 10/17/2014   LDLCALC 104* 10/17/2014   ALT 17 10/17/2014   AST 19 10/17/2014   NA 139 10/17/2014   K 4.1 10/17/2014   CL 102 10/17/2014   CREATININE 0.83 10/17/2014   BUN 20 10/17/2014   CO2 33* 10/17/2014   TSH 1.93 10/17/2014   INR 1.0 10/16/2008   HGBA1C 5.7 06/21/2011    Lab Results  Component Value Date   TSH 1.93 10/17/2014   Lab Results  Component Value Date   WBC 7.1 10/17/2014   HGB 13.4 10/17/2014   HCT 40.2 10/17/2014   MCV 91.8 10/17/2014   PLT 222.0 10/17/2014   Lab Results  Component Value Date   NA 139 10/17/2014   K 4.1 10/17/2014   CHLORIDE 106 10/23/2013   CO2 33* 10/17/2014   GLUCOSE 100* 10/17/2014   BUN 20 10/17/2014   CREATININE 0.83 10/17/2014   BILITOT 0.5 10/17/2014   ALKPHOS 66 10/17/2014   AST 19 10/17/2014   ALT 17 10/17/2014   PROT 7.5 10/17/2014   ALBUMIN 4.2 10/17/2014   CALCIUM 9.5 10/17/2014   ANIONGAP 8 10/23/2013   GFR 73.33 10/17/2014   Lab Results  Component Value Date   CHOL 183 10/17/2014   Lab Results  Component Value Date   HDL 42.70 10/17/2014   Lab Results  Component Value Date   LDLCALC 104* 10/17/2014   Lab Results  Component Value Date   TRIG 180.0* 10/17/2014   Lab Results  Component Value Date   CHOLHDL 4 10/17/2014   Lab Results  Component Value Date   HGBA1C 5.7 06/21/2011       Assessment & Plan:   Problem List Items Addressed This Visit    Essential hypertension - Primary    Cont' diovan  Recheck 3 m      Relevant Medications    furosemide (LASIX) 20 MG tablet    Other Visit Diagnoses    Edema           I have discontinued Elizabeth [name]'s clobetasol ointment, beclomethasone, albuterol, and DIOVAN HCT. I am also  having her start on furosemide. Additionally, I am having her maintain her [employer] LEG CRAMPS PAIN RELIEF, Cholecalciferol, Vitamin D (Ergocalciferol), aspirin, levocetirizine, Calcium-Magnesium-Vitamin D, and Acetaminophen (TYLENOL 8 HOUR PO).  Meds ordered this encounter  Medications  . Calcium-Magnesium-Vitamin D 300-20-200 MG-MG-UNIT CHEW    Sig: Chew by mouth.  . Acetaminophen (TYLENOL 8 HOUR PO)    Sig: Take by mouth.  . furosemide (LASIX) 20 MG tablet    Sig: Take 1 tablet (20 mg total) by mouth daily.    Dispense:  30 tablet    Refill:  5  . DISCONTD: valsartan (DIOVAN) 320 MG tablet    Sig: Take 1 tablet (320 mg total) by mouth daily.    Dispense:  30 tablet    Refill:  Faxon, DO

## 2014-12-30 NOTE — Progress Notes (Signed)
Pre visit review using our clinic review tool, if applicable. No additional management support is needed unless otherwise documented below in the visit note. 

## 2014-12-31 ENCOUNTER — Encounter: Payer: Self-pay | Admitting: Family Medicine

## 2014-12-31 NOTE — Telephone Encounter (Signed)
Please advise      KP 

## 2014-12-31 NOTE — Telephone Encounter (Signed)
I would think if it was that low she would feel terrible--- have we checked her machine?   Any way she can bring it in if not.?

## 2015-01-01 ENCOUNTER — Encounter: Payer: Self-pay | Admitting: Family Medicine

## 2015-01-01 NOTE — Progress Notes (Unsigned)
Called patient left message to call back. Want to see if issues have been addressed. Called left meessage for patient to call baco tb call mand

## 2015-01-02 ENCOUNTER — Ambulatory Visit (INDEPENDENT_AMBULATORY_CARE_PROVIDER_SITE_OTHER): Payer: BC Managed Care – PPO | Admitting: Family Medicine

## 2015-01-02 ENCOUNTER — Encounter: Payer: Self-pay | Admitting: Family Medicine

## 2015-01-02 VITALS — BP 108/64 | HR 69 | Temp 98.1°F | Wt 278.0 lb

## 2015-01-02 DIAGNOSIS — I10 Essential (primary) hypertension: Secondary | ICD-10-CM | POA: Diagnosis not present

## 2015-01-02 DIAGNOSIS — R002 Palpitations: Secondary | ICD-10-CM | POA: Diagnosis not present

## 2015-01-02 DIAGNOSIS — I8390 Asymptomatic varicose veins of unspecified lower extremity: Secondary | ICD-10-CM

## 2015-01-02 DIAGNOSIS — I868 Varicose veins of other specified sites: Secondary | ICD-10-CM

## 2015-01-02 MED ORDER — VALSARTAN 160 MG PO TABS: 160.0000 mg | ORAL_TABLET | Freq: Every day | ORAL | Status: DC

## 2015-01-02 MED ORDER — DIOVAN 160 MG PO TABS: ORAL_TABLET | ORAL | Status: DC

## 2015-01-02 MED ORDER — NONFORMULARY OR COMPOUNDED ITEM: Status: DC

## 2015-01-02 NOTE — Patient Instructions (Signed)
Heart Murmur A heart murmur is an extra sound heard by your health care provider when listening to your heart with a device called a stethoscope. The sound comes from turbulence when blood flows through the heart and may be a "hum" or "whoosh" sound heard when the heart beats. There are two types of heart murmurs:  Innocent murmurs. Most people with this type of heart murmur do not have a heart problem. Many children have innocent heart murmurs. Your health care provider may suggest some basic testing to know whether your murmur is an innocent murmur. If an innocent heart murmur is found, there is no need for further tests or treatment and no need to restrict activities or stop playing sports.  Abnormal murmurs. These types of murmurs can occur in children and adults. In children, abnormal heart murmurs are typically caused from heart defects that are present at birth (congenital). In adults, abnormal murmurs are usually from heart valve problems caused by disease, infection, or aging. CAUSES  All heart murmurs are a result of an issue with your heart valves. Normally, these valves open to let blood flow through or out of your heart and then shut to keep it from flowing backward. If they do not work properly, you could have:  Regurgitation--When blood leaks back through the valve in the wrong direction.  Mitral valve prolapse--When the mitral valve of the heart has a loose flap and does not close tightly.  Stenosis--When the valve does not open enough and blocks blood flow. SIGNS AND SYMPTOMS  Innocent murmurs do not cause symptoms, and many people with abnormal murmurs may or may not have symptoms. If symptoms do develop, they may include:  Shortness of breath.  Blue coloring of the skin, especially on the fingertips.  Chest pain.  Palpitations, or feeling a fluttering or skipped heartbeat.  Fainting.  Persistent cough.  Getting tired much faster than expected. DIAGNOSIS  A heart  murmur might be heard during a sports physical or during any type of examination. When a murmur is heard, it may suggest a possible problem. When this happens, your health care provider may ask you to see a heart specialist (cardiologist). You may also be asked to have one or more heart tests. In these cases, testing may vary depending on what your health care provider heard. Tests for a heart murmur may include:  Electrocardiogram.  Echocardiogram.  MRI. For children and adults who have an abnormal heart murmur and want to play sports, it is important to complete testing, review test results, and receive recommendations from your health care provider. If heart disease is present, it may not be safe to play. TREATMENT  Innocent murmurs require no treatment or activity restriction. If an abnormal murmur represents a problem with the heart, treatment will depend on the exact nature of the problem. In these cases, medicine or surgery may be needed to treat the problem. HOME CARE INSTRUCTIONS If you want to participate in sports or other types of strenuous physical activity, it is important to discuss this first with your health care provider. If the murmur represents a problem with the heart and you choose to participate in sports, there is a small chance that a serious problem (including sudden death) could result.  SEEK MEDICAL CARE IF:   You feel that your symptoms are slowly worsening.  You develop any new symptoms that cause concern.  You feel that you are having side effects from any medicines prescribed. SEEK IMMEDIATE MEDICAL CARE IF:  You develop chest pain.  You have shortness of breath.  You notice that your heart beats irregularly often enough to cause you to worry.  You have fainting spells.  Your symptoms suddenly get worse. Document Released: 05/27/2004 Document Revised: 04/24/2013 Document Reviewed: 12/25/2012 [facility] Patient Information 2015 [place], [place]. This  information is not intended to replace advice given to you by your health care provider. Make sure you discuss any questions you have with your health care provider.

## 2015-01-02 NOTE — Progress Notes (Signed)
Pre visit review using our clinic review tool, if applicable. No additional management support is needed unless otherwise documented below in the visit note. 

## 2015-01-02 NOTE — Progress Notes (Signed)
Patient ID: Elizabeth [name], female    DOB: Nov 20, 1950  Age: 64 y.o. MRN: [id number]    Subjective:  Subjective HPI Elizabeth [name] presents for f/u bp and she c/o palpitations. No chest pain, or sob.     Review of Systems  Constitutional: Negative for diaphoresis, appetite change, fatigue and unexpected weight change.  Eyes: Negative for pain, redness and visual disturbance.  Respiratory: Negative for cough, chest tightness, shortness of breath and wheezing.   Cardiovascular: Positive for palpitations. Negative for chest pain and leg swelling.  Endocrine: Negative for cold intolerance, heat intolerance, polydipsia, polyphagia and polyuria.  Genitourinary: Negative for dysuria, frequency and difficulty urinating.  Neurological: Negative for dizziness, light-headedness, numbness and headaches.    History Past Medical History  Diagnosis Date  . Hyperlipidemia   . Hypertension   . Low back pain   . Vitamin D deficiency   . Hx of cancer of lung 6/10    Removed June 2010    She has past surgical history that includes Cholecystectomy; Tubal ligation; and Lung removal, partial.   Her family history includes Cancer in her brother and mother; Heart disease in her father; Hypertension in her mother; Stroke in her mother.She reports that she quit smoking about 9 years ago. Her smoking use included Cigarettes. She has never used smokeless tobacco. She reports that she does not drink alcohol or use illicit drugs.  Current Outpatient Prescriptions on File Prior to Visit  Medication Sig Dispense Refill  . Acetaminophen (TYLENOL 8 HOUR PO) Take by mouth.    Marland Kitchen aspirin 81 MG tablet Take 81 mg by mouth daily.    . Calcium-Magnesium-Vitamin D 300-20-200 MG-MG-UNIT CHEW Chew by mouth.    . Cholecalciferol 4000 UNITS CAPS Take 4,000 capsules by mouth daily.    . furosemide (LASIX) 20 MG tablet Take 1 tablet (20 mg total) by mouth daily. 30 tablet 5  . Homeopathic Products ([employer] LEG CRAMPS PAIN RELIEF)  TABS Take by mouth.    . levocetirizine (XYZAL) 5 MG tablet Take 1 tablet by mouth daily.    . valsartan (DIOVAN) 320 MG tablet Take 1 tablet (320 mg total) by mouth daily. 30 tablet 5  . Vitamin D, Ergocalciferol, (DRISDOL) 50000 UNITS CAPS capsule Take 1 capsule (50,000 Units total) by mouth every 7 (seven) days. 12 capsule 5   No current facility-administered medications on file prior to visit.     Objective:  Objective Physical Exam  Constitutional: She is oriented to person, place, and time. She appears well-developed and well-nourished.  HENT:  Head: Normocephalic and atraumatic.  Eyes: Conjunctivae and EOM are normal.  Neck: Normal range of motion. Neck supple. No JVD present. Carotid bruit is not present. No thyromegaly present.  Cardiovascular: Normal rate and regular rhythm.   Murmur heard. Pulmonary/Chest: Effort normal and breath sounds normal. No respiratory distress. She has no wheezes. She has no rales. She exhibits no tenderness.  Musculoskeletal: She exhibits edema.  Neurological: She is alert and oriented to person, place, and time.  Skin:     Psychiatric: She has a normal mood and affect. Her behavior is normal.   BP 108/64 mmHg  Pulse 69  Temp(Src) 98.1 F (36.7 C) (Oral)  Wt 278 lb (126.1 kg)  SpO2 98% Wt Readings from Last 3 Encounters:  01/02/15 278 lb (126.1 kg)  12/30/14 281 lb 6.4 oz (127.642 kg)  12/05/14 283 lb (128.368 kg)     Lab Results  Component Value Date   WBC  7.1 10/17/2014   HGB 13.4 10/17/2014   HCT 40.2 10/17/2014   PLT 222.0 10/17/2014   GLUCOSE 100* 10/17/2014   CHOL 183 10/17/2014   TRIG 180.0* 10/17/2014   HDL 42.70 10/17/2014   LDLCALC 104* 10/17/2014   ALT 17 10/17/2014   AST 19 10/17/2014   NA 139 10/17/2014   K 4.1 10/17/2014   CL 102 10/17/2014   CREATININE 0.83 10/17/2014   BUN 20 10/17/2014   CO2 33* 10/17/2014   TSH 1.93 10/17/2014   INR 1.0 10/16/2008   HGBA1C 5.7 06/21/2011    No results found.     Assessment & Plan:  Plan I am having Ms. Dormer maintain her [employer] LEG CRAMPS PAIN RELIEF, Cholecalciferol, Vitamin D (Ergocalciferol), aspirin, levocetirizine, Calcium-Magnesium-Vitamin D, Acetaminophen (TYLENOL 8 HOUR PO), furosemide, and valsartan.  No orders of the defined types were placed in this encounter.    Problem List Items Addressed This Visit    None    Visit Diagnoses    Palpitations    -  Primary    Relevant Orders    EKG 12-Lead (Completed)       Follow-up: No Follow-up on file.  [name], DO     +

## 2015-01-02 NOTE — Telephone Encounter (Signed)
The nurse schedule is blocked please advise      KP

## 2015-01-03 ENCOUNTER — Other Ambulatory Visit: Payer: Self-pay | Admitting: Family Medicine

## 2015-01-03 ENCOUNTER — Telehealth: Payer: Self-pay | Admitting: *Deleted

## 2015-01-03 ENCOUNTER — Ambulatory Visit: Payer: BC Managed Care – PPO

## 2015-01-03 DIAGNOSIS — I10 Essential (primary) hypertension: Secondary | ICD-10-CM

## 2015-01-03 MED ORDER — DIOVAN 160 MG PO TABS: ORAL_TABLET | ORAL | Status: DC

## 2015-01-03 NOTE — Telephone Encounter (Signed)
PA for Diovan '160mg'$  tablets approved 12/04/2014 - 01/03/2016.

## 2015-01-04 NOTE — Assessment & Plan Note (Signed)
Cont' diovan  Recheck 3 m

## 2015-01-08 ENCOUNTER — Encounter: Payer: Self-pay | Admitting: Cardiovascular Disease

## 2015-01-08 ENCOUNTER — Ambulatory Visit (INDEPENDENT_AMBULATORY_CARE_PROVIDER_SITE_OTHER): Payer: BC Managed Care – PPO | Admitting: Cardiovascular Disease

## 2015-01-08 VITALS — BP 128/66 | HR 72 | Ht 64.0 in | Wt 278.0 lb

## 2015-01-08 DIAGNOSIS — R002 Palpitations: Secondary | ICD-10-CM

## 2015-01-08 DIAGNOSIS — I350 Nonrheumatic aortic (valve) stenosis: Secondary | ICD-10-CM | POA: Diagnosis not present

## 2015-01-08 DIAGNOSIS — I352 Nonrheumatic aortic (valve) stenosis with insufficiency: Secondary | ICD-10-CM | POA: Insufficient documentation

## 2015-01-08 DIAGNOSIS — E66813 Obesity, class 3: Secondary | ICD-10-CM | POA: Insufficient documentation

## 2015-01-08 DIAGNOSIS — I739 Peripheral vascular disease, unspecified: Secondary | ICD-10-CM

## 2015-01-08 DIAGNOSIS — E8881 Metabolic syndrome: Secondary | ICD-10-CM

## 2015-01-08 DIAGNOSIS — E785 Hyperlipidemia, unspecified: Secondary | ICD-10-CM

## 2015-01-08 HISTORY — DX: Nonrheumatic aortic (valve) stenosis: I35.0

## 2015-01-08 HISTORY — DX: Morbid (severe) obesity due to excess calories: E66.01

## 2015-01-08 NOTE — Patient Instructions (Signed)
Labs---BMP, HGA1C, do not eat or drink before test.  Do lipids in 3 months, do not eat or drink before test. (We will mail you your lab slip).   Your physician has requested that you have an ankle brachial index (ABI). During this test an ultrasound and blood pressure cuff are used to evaluate the arteries that supply the arms and legs with blood. Allow thirty minutes for this exam. There are no restrictions or special instructions.   Your physician recommends that you schedule a follow-up appointment in: 3 months with Doctor Oval Linsey.

## 2015-01-08 NOTE — Progress Notes (Signed)
Cardiology Office Note   Date:  01/08/2015   ID:  Elizabeth [name], DOB [birth year], MRN [id number]  PCP:  [name], DO  Cardiologist:   Sharol Harness, MD   Chief Complaint  Patient presents with  . New Evaluation    Both of patient's legs swell.  . Dizziness    She feels dizzy at times.  . Chest Pain    Chest pain last week.       History of Present Illness: Elizabeth [name] is a 64 y.o. female who presents for an evaluation of newly diagnosed aortic stenosis. Ms. Elizabeth [name] was evaluated by her PCP, Dr. Etter Sjogren on 12/05/14.  At that appointment she was noted to have a heart murmur and lower extremity edema.  At that appointment her Diovan/HCTZ was doubled.  However, she developed hypotension so the HCTZ was discontinued and lasix was started instead.  This has helped her LE edema.  Echo showed LVEF 60-65% and grade 1 diastolic dysfunction.  There was also mild aortic stenosis with a mean gradient of 41mHg, peak velocity of 2.9 m/s and valve area of 1.5 cm^2.  On  9/1 she was evaluated for palpitations and referred to cardiology for further evaluation.  ECG showed sinus rhythm at 81 bpm.  Overall Ms. Elizabeth [name] feeling well. She does note one episode of heart fluttering that occurred one month ago at rest.  The episode lasted for seconds and was not associated with shortness of breath or chest pain. She does occasionally notice chest pain that typically occurs when resting. The episodes last for seconds but recur over the course of 30 minutes to one hour. This happened approximately once per month. It is not associated with any shortness of breath, nausea, vomiting, lightheadedness, dizziness, or palpitations. At work she lifts 15-40 pound boxes repetitively and denies any chest pain or shortness of breath with this activity. She notes that her feet hurt at the end of the day and she does not get any formal exercise. She is also reported 3 weeks of intermittent left eye twitching and a rash  on her left lower leg.  Past Medical History  Diagnosis Date  . Hyperlipidemia   . Hypertension   . Low back pain   . Vitamin D deficiency   . Hx of cancer of lung 6/10    Removed June 2010    Past Surgical History  Procedure Laterality Date  . Cholecystectomy    . Tubal ligation    . Lung removal, partial       Current Outpatient Prescriptions  Medication Sig Dispense Refill  . Acetaminophen (TYLENOL 8 HOUR PO) Take by mouth daily. TAKE TWO TABLETS WITH EACH MEAL.    .Marland Kitchenaspirin 81 MG tablet Take 81 mg by mouth 3 (three) times daily.     . Calcium-Magnesium-Vitamin D 300-20-200 MG-MG-UNIT CHEW Chew by mouth.    . Cholecalciferol 4000 UNITS CAPS Take 4,000 capsules by mouth daily.    .Marland KitchenDIOVAN 160 MG tablet 1 po qd 30 tablet 2  . furosemide (LASIX) 20 MG tablet Take 1 tablet (20 mg total) by mouth daily. 30 tablet 5  . Homeopathic Products ([employer] LEG CRAMPS PAIN RELIEF) TABS Take by mouth as needed.     .Marland Kitchenlevocetirizine (XYZAL) 5 MG tablet Take 1 tablet by mouth daily.    . NONFORMULARY OR COMPOUNDED ITEM Compression stocking   Dx varicose veins-----20-30 mg hg 1 each 0  . Vitamin D, Ergocalciferol, (DRISDOL)  50000 UNITS CAPS capsule Take 1 capsule (50,000 Units total) by mouth every 7 (seven) days. 12 capsule 5   No current facility-administered medications for this visit.    Allergies:   Penicillins    Social History:  The patient  reports that she quit smoking about 9 years ago. Her smoking use included Cigarettes. She has never used smokeless tobacco. She reports that she does not drink alcohol or use illicit drugs.   Family History:  The patient's family history includes Cancer in her brother and mother; Heart disease in her father; Hypertension in her mother; Stroke in her mother.    ROS:  Please see the history of present illness.   Otherwise, review of systems are positive for none.   All other systems are reviewed and negative.    PHYSICAL EXAM: VS:  BP 128/66 mmHg   Pulse 72  Ht '5\' 4"'$  (1.626 m)  Wt 126.1 kg (278 lb)  BMI 47.70 kg/m2 , BMI Body mass index is 47.7 kg/(m^2). GENERAL:  Well appearing HEENT:  Pupils equal round and reactive, fundi not visualized, oral mucosa unremarkable NECK:  No jugular venous distention, waveform within normal limits, carotid upstroke brisk and symmetric, no bruits, no thyromegaly LYMPHATICS:  No cervical adenopathy LUNGS:  Clear to auscultation bilaterally HEART:  RRR.  PMI not displaced or sustained,S1 and S2 within normal limits, no S3, no S4, no clicks, no rubs, III/VI early-peaking crescendo-decrescendo murmur at the LUSB. ABD:  Flat, positive bowel sounds normal in frequency in pitch, no bruits, no rebound, no guarding, no midline pulsatile mass, no hepatomegaly, no splenomegaly EXT:  2 plus pulses throughout, no edema, no cyanosis no clubbing SKIN:  No rashes no nodules NEURO:  Cranial nerves II through XII grossly intact, motor grossly intact throughout PSYCH:  Cognitively intact, oriented to person place and time    EKG:  EKG is not ordered today. The ekg ordered 01/02/15  demonstrates sinus rhythm at 81 bpm.  TTE 12/11/14: Study Conclusions  - Left ventricle: The cavity size was normal. There was mild concentric hypertrophy. Systolic function was normal. The estimated ejection fraction was in the range of 60% to 65%. Wall motion was normal; there were no regional wall motion abnormalities. Doppler parameters are consistent with abnormal left ventricular relaxation (grade 1 diastolic dysfunction). - Aortic valve: Cusp separation was reduced. There was moderate stenosis. Peak velocity (S): 292 cm/s. Mean gradient (S): 17 mm Hg. Valve area (VTI): 1.53 cm^2. Valve area (Vmax): 1.38 cm^2. Valve area (Vmean): 1.48 cm^2.   Recent Labs: 10/17/2014: ALT 17; BUN 20; Creatinine, Ser 0.83; Hemoglobin 13.4; Magnesium 1.9; Platelets 222.0; Potassium 4.1; Sodium 139; TSH 1.93    Lipid Panel      Component Value Date/Time   CHOL 183 10/17/2014 1034   TRIG 180.0* 10/17/2014 1034   TRIG 73 04/14/2006 1429   HDL 42.70 10/17/2014 1034   CHOLHDL 4 10/17/2014 1034   CHOLHDL 3.1 CALC 04/14/2006 1429   VLDL 36.0 10/17/2014 1034   LDLCALC 104* 10/17/2014 1034      Wt Readings from Last 3 Encounters:  01/08/15 126.1 kg (278 lb)  01/02/15 126.1 kg (278 lb)  12/30/14 127.642 kg (281 lb 6.4 oz)      Other studies Reviewed: Additional studies/ records that were reviewed today include: . Review of the above records demonstrates:  Please see elsewhere in the note.     ASSESSMENT AND PLAN:  # Mild-moderate aortic stenosis:  Ms. Dewberry echo is consistent with mild-moderate  aortic stenosis.  It is very unlikely that any of her symptoms are related to the aortic stenosis. At this point we will monitor it every 3 years to assess for disease progression. We will re-image if new symptoms develop.  # Hyperlipidemia: ASCVD 10 year risk of MI or CVA is 7.9%.  This can be decreased to 4% with optimal risk factor management.  Ms. Murfin is going to work on increasing her exercise and improving her diet over the next 3 months.  At that time her lipids will be re-checked.  If her 10 year risk is >5%, we will start a statin at that time.  Agree with ASA '81mg'$  daily.  # Hypertension: BP well-controlled.  No changes.  # Obesity: Ms. Brymer was encouraged to increase her physical activity and make at least one dietary intevention as above.  # Claudication: Ms. Rahe reports pain in her feet and calves and into the day after exercising and being on her feet. We will obtain ABIs to assess for peripheral vascular disease. She will continue on aspirin and consider statin therapy at her next appointment as above.  # Eye twitching: BMP given lasix and prior HCTZ therapy.  Current medicines are reviewed at length with the patient today.  The patient does not have concerns regarding medicines.  The following  changes have [name] made:  no change  Labs/ tests ordered today include:   Orders Placed This Encounter  Procedures  . Hemoglobin A1c  . Basic metabolic panel  . Lipid panel     Disposition:   FU with Dr. Jonelle Sidle C. Winn in 3 months   Signed, Sharol Harness, MD  01/08/2015 4:48 PM    [place]

## 2015-01-09 ENCOUNTER — Other Ambulatory Visit: Payer: Self-pay | Admitting: Cardiovascular Disease

## 2015-01-09 DIAGNOSIS — I739 Peripheral vascular disease, unspecified: Secondary | ICD-10-CM

## 2015-01-12 ENCOUNTER — Encounter: Payer: Self-pay | Admitting: Family Medicine

## 2015-01-14 ENCOUNTER — Ambulatory Visit (HOSPITAL_COMMUNITY)
Admission: RE | Admit: 2015-01-14 | Discharge: 2015-01-14 | Disposition: A | Discharge status: Home / Self Care | Payer: BC Managed Care – PPO | Source: Ambulatory Visit | Attending: Cardiovascular Disease | Admitting: Cardiovascular Disease

## 2015-01-14 DIAGNOSIS — E785 Hyperlipidemia, unspecified: Secondary | ICD-10-CM | POA: Diagnosis not present

## 2015-01-14 DIAGNOSIS — I739 Peripheral vascular disease, unspecified: Secondary | ICD-10-CM | POA: Diagnosis not present

## 2015-01-14 DIAGNOSIS — I10 Essential (primary) hypertension: Secondary | ICD-10-CM | POA: Diagnosis not present

## 2015-01-14 NOTE — Telephone Encounter (Signed)
Ok to reprint and mail it to her

## 2015-01-16 ENCOUNTER — Telehealth: Payer: Self-pay | Admitting: *Deleted

## 2015-01-16 NOTE — Telephone Encounter (Signed)
-----   Message from Skeet Latch, MD sent at 01/15/2015 12:06 AM EDT ----- Normal ABIs.  There is no evidence of peripheral arterial disease.

## 2015-01-16 NOTE — Telephone Encounter (Signed)
Spoke to patient. Result given . Verbalized understanding  

## 2015-01-18 LAB — BASIC METABOLIC PANEL
BUN: 19 mg/dL (ref 7–25)
CO2: 27 mmol/L (ref 20–31)
CREATININE: 0.96 mg/dL (ref 0.50–0.99)
Calcium: 9.4 mg/dL (ref 8.6–10.4)
Chloride: 105 mmol/L (ref 98–110)
Glucose, Bld: 98 mg/dL (ref 65–99)
POTASSIUM: 4.8 mmol/L (ref 3.5–5.3)
Sodium: 143 mmol/L (ref 135–146)

## 2015-01-18 LAB — HEMOGLOBIN A1C
HEMOGLOBIN A1C: 5.5 % (ref <5.7)
Mean Plasma Glucose: 111 mg/dL (ref <117)

## 2015-01-20 ENCOUNTER — Encounter: Payer: Self-pay | Admitting: Family Medicine

## 2015-01-20 ENCOUNTER — Ambulatory Visit (INDEPENDENT_AMBULATORY_CARE_PROVIDER_SITE_OTHER): Payer: BC Managed Care – PPO | Admitting: Family Medicine

## 2015-01-20 VITALS — BP 142/70 | HR 76 | Temp 98.3°F | Wt 274.6 lb

## 2015-01-20 DIAGNOSIS — I8390 Asymptomatic varicose veins of unspecified lower extremity: Secondary | ICD-10-CM

## 2015-01-20 DIAGNOSIS — F418 Other specified anxiety disorders: Secondary | ICD-10-CM | POA: Diagnosis not present

## 2015-01-20 DIAGNOSIS — Z23 Encounter for immunization: Secondary | ICD-10-CM

## 2015-01-20 DIAGNOSIS — F419 Anxiety disorder, unspecified: Secondary | ICD-10-CM | POA: Insufficient documentation

## 2015-01-20 DIAGNOSIS — I10 Essential (primary) hypertension: Secondary | ICD-10-CM

## 2015-01-20 DIAGNOSIS — I868 Varicose veins of other specified sites: Secondary | ICD-10-CM | POA: Diagnosis not present

## 2015-01-20 MED ORDER — NONFORMULARY OR COMPOUNDED ITEM: Status: DC

## 2015-01-20 NOTE — Patient Instructions (Signed)

## 2015-01-20 NOTE — Progress Notes (Signed)
Patient ID: Elizabeth [name], female   DOB: 1950-07-17, 64 y.o.   MRN: [id number]   Subjective:    Patient ID: Elizabeth [name], female    DOB: 01/23/1951, 64 y.o.   MRN: [id number]  Chief Complaint  Patient presents with  . Hypertension    elevated BP due to work related stress    HPI Patient is in today for htn.   Her bp is elevated at work and when thinking about work.  On weekends and days off bp is < 120 / 80.  Pt started crying when talking about it.  Her daughter is with her.  I've never seen her like this.  Pt is not suicidal.    Past Medical History  Diagnosis Date  . Hyperlipidemia   . Hypertension   . Low back pain   . Vitamin D deficiency   . Hx of cancer of lung 6/10    Removed June 2010  . Aortic stenosis, mild 01/08/2015  . Morbid obesity 01/08/2015    Past Surgical History  Procedure Laterality Date  . Cholecystectomy    . Tubal ligation    . Lung removal, partial      Family History  Problem Relation Age of Onset  . Hypertension Mother   . Stroke Mother   . Cancer Mother     lung  . Heart disease Father     cabg  . Cancer Brother     skin    Social History   Social History  . Marital Status: Divorced    Spouse Name: N/A  . Number of Children: 1  . Years of Education: N/A   Occupational History  . School Nutritionist    Social History Main Topics  . Smoking status: Former Smoker    Types: Cigarettes    Quit date: 02/01/2005  . Smokeless tobacco: Never Used  . Alcohol Use: No  . Drug Use: No  . Sexual Activity: Yes   Other Topics Concern  . Not on file   Social History Narrative   Exercise--  no    Outpatient Prescriptions Prior to Visit  Medication Sig Dispense Refill  . Acetaminophen (TYLENOL 8 HOUR PO) Take by mouth daily. TAKE TWO TABLETS WITH EACH MEAL.    Marland Kitchen aspirin 81 MG tablet Take 81 mg by mouth 3 (three) times daily.     . Calcium-Magnesium-Vitamin D 300-20-200 MG-MG-UNIT CHEW Chew by mouth.    . Cholecalciferol 4000 UNITS CAPS  Take 4,000 capsules by mouth daily.    Marland Kitchen DIOVAN 160 MG tablet 1 po qd 30 tablet 2  . furosemide (LASIX) 20 MG tablet Take 1 tablet (20 mg total) by mouth daily. 30 tablet 5  . Homeopathic Products ([employer] LEG CRAMPS PAIN RELIEF) TABS Take by mouth as needed.     Marland Kitchen levocetirizine (XYZAL) 5 MG tablet Take 1 tablet by mouth daily.    . Vitamin D, Ergocalciferol, (DRISDOL) 50000 UNITS CAPS capsule Take 1 capsule (50,000 Units total) by mouth every 7 (seven) days. 12 capsule 5  . NONFORMULARY OR COMPOUNDED ITEM Compression stocking   Dx varicose veins-----20-30 mg hg 1 each 0   No facility-administered medications prior to visit.    Allergies  Allergen Reactions  . Penicillins Other (See Comments)    Fever     Review of Systems  Constitutional: Negative for fever and malaise/fatigue.  HENT: Negative for congestion.   Eyes: Negative for discharge.  Respiratory: Negative for shortness of breath.   Cardiovascular:  Negative for chest pain, palpitations and leg swelling.  Gastrointestinal: Negative for nausea and abdominal pain.  Genitourinary: Negative for dysuria.  Musculoskeletal: Negative for falls.  Skin: Negative for rash.  Neurological: Negative for loss of consciousness and headaches.  Endo/Heme/Allergies: Negative for environmental allergies.  Psychiatric/Behavioral: Positive for depression. Negative for suicidal ideas. The patient is nervous/anxious.        Objective:    Physical Exam  Constitutional: She is oriented to person, place, and time. She appears well-developed and well-nourished.  HENT:  Head: Normocephalic and atraumatic.  Eyes: Conjunctivae and EOM are normal.  Neck: Normal range of motion. Neck supple. No JVD present. Carotid bruit is not present. No thyromegaly present.  Cardiovascular: Normal rate and regular rhythm.   Murmur heard. Pulmonary/Chest: Effort normal and breath sounds normal. No respiratory distress. She has no wheezes. She has no rales. She  exhibits no tenderness.  Musculoskeletal: She exhibits no edema.  Neurological: She is alert and oriented to person, place, and time.  Psychiatric: Her mood appears anxious. She exhibits a depressed mood. She expresses no homicidal and no suicidal ideation. She expresses no homicidal plans.    BP 142/70 mmHg  Pulse 76  Temp(Src) 98.3 F (36.8 C) (Oral)  Wt 274 lb 9.6 oz (124.558 kg)  SpO2 97% Wt Readings from Last 3 Encounters:  01/20/15 274 lb 9.6 oz (124.558 kg)  01/08/15 278 lb (126.1 kg)  01/02/15 278 lb (126.1 kg)     Lab Results  Component Value Date   WBC 7.1 10/17/2014   HGB 13.4 10/17/2014   HCT 40.2 10/17/2014   PLT 222.0 10/17/2014   GLUCOSE 98 01/18/2015   CHOL 183 10/17/2014   TRIG 180.0* 10/17/2014   HDL 42.70 10/17/2014   LDLCALC 104* 10/17/2014   ALT 17 10/17/2014   AST 19 10/17/2014   NA 143 01/18/2015   K 4.8 01/18/2015   CL 105 01/18/2015   CREATININE 0.96 01/18/2015   BUN 19 01/18/2015   CO2 27 01/18/2015   TSH 1.93 10/17/2014   INR 1.0 10/16/2008   HGBA1C 5.5 01/18/2015    Lab Results  Component Value Date   TSH 1.93 10/17/2014   Lab Results  Component Value Date   WBC 7.1 10/17/2014   HGB 13.4 10/17/2014   HCT 40.2 10/17/2014   MCV 91.8 10/17/2014   PLT 222.0 10/17/2014   Lab Results  Component Value Date   NA 143 01/18/2015   K 4.8 01/18/2015   CHLORIDE 106 10/23/2013   CO2 27 01/18/2015   GLUCOSE 98 01/18/2015   BUN 19 01/18/2015   CREATININE 0.96 01/18/2015   BILITOT 0.5 10/17/2014   ALKPHOS 66 10/17/2014   AST 19 10/17/2014   ALT 17 10/17/2014   PROT 7.5 10/17/2014   ALBUMIN 4.2 10/17/2014   CALCIUM 9.4 01/18/2015   ANIONGAP 8 10/23/2013   GFR 73.33 10/17/2014   Lab Results  Component Value Date   CHOL 183 10/17/2014   Lab Results  Component Value Date   HDL 42.70 10/17/2014   Lab Results  Component Value Date   LDLCALC 104* 10/17/2014   Lab Results  Component Value Date   TRIG 180.0* 10/17/2014   Lab  Results  Component Value Date   CHOLHDL 4 10/17/2014   Lab Results  Component Value Date   HGBA1C 5.5 01/18/2015       Assessment & Plan:   Problem List Items Addressed This Visit    Varicose veins   Relevant Medications  NONFORMULARY OR COMPOUNDED ITEM   Essential hypertension - Primary    High only at work rto 1 month con't diovan      Depression with anxiety    Situational Refer to counseling Note written for 1 month unless counselor extends it       Other Visit Diagnoses    Encounter for immunization           I am having Ms. Andrus maintain her [employer] LEG CRAMPS PAIN RELIEF, Cholecalciferol, Vitamin D (Ergocalciferol), aspirin, levocetirizine, Calcium-Magnesium-Vitamin D, Acetaminophen (TYLENOL 8 HOUR PO), furosemide, DIOVAN, and NONFORMULARY OR COMPOUNDED ITEM.  Meds ordered this encounter  Medications  . NONFORMULARY OR COMPOUNDED ITEM    Sig: Compression stocking   Dx varicose veins-----20-30 mg hg    Dispense:  1 each    Refill:  0     [name], DO

## 2015-01-20 NOTE — Assessment & Plan Note (Signed)
Situational Refer to counseling Note written for 1 month unless counselor extends it

## 2015-01-20 NOTE — Assessment & Plan Note (Signed)
High only at work rto 1 month con't diovan

## 2015-01-20 NOTE — Progress Notes (Signed)
Pre visit review using our clinic review tool, if applicable. No additional management support is needed unless otherwise documented below in the visit note. 

## 2015-01-21 ENCOUNTER — Telehealth: Payer: Self-pay | Admitting: *Deleted

## 2015-01-21 NOTE — Telephone Encounter (Signed)
Spoke to patient. Result given . Verbalized understanding  

## 2015-01-21 NOTE — Telephone Encounter (Signed)
-----   Message from Skeet Latch, MD sent at 01/20/2015 10:57 PM EDT ----- Labs normal.

## 2015-01-22 ENCOUNTER — Encounter: Payer: Self-pay | Admitting: Family Medicine

## 2015-01-27 ENCOUNTER — Ambulatory Visit: Payer: Self-pay | Admitting: Family Medicine

## 2015-01-28 ENCOUNTER — Institutional Professional Consult (permissible substitution): Payer: Self-pay | Admitting: Pulmonary Disease

## 2015-01-28 ENCOUNTER — Ambulatory Visit (INDEPENDENT_AMBULATORY_CARE_PROVIDER_SITE_OTHER): Payer: BC Managed Care – PPO | Admitting: Licensed Clinical Social Worker

## 2015-01-28 DIAGNOSIS — F321 Major depressive disorder, single episode, moderate: Secondary | ICD-10-CM | POA: Diagnosis not present

## 2015-02-03 ENCOUNTER — Encounter: Payer: Self-pay | Admitting: Family Medicine

## 2015-02-03 ENCOUNTER — Ambulatory Visit (INDEPENDENT_AMBULATORY_CARE_PROVIDER_SITE_OTHER): Payer: BC Managed Care – PPO | Admitting: Family Medicine

## 2015-02-03 VITALS — BP 124/70 | HR 80 | Temp 98.5°F | Wt 274.6 lb

## 2015-02-03 DIAGNOSIS — M79672 Pain in left foot: Secondary | ICD-10-CM

## 2015-02-03 DIAGNOSIS — I10 Essential (primary) hypertension: Secondary | ICD-10-CM | POA: Diagnosis not present

## 2015-02-03 NOTE — Patient Instructions (Addendum)
VIONIC shoes can be found at [employer] or inserts found online.    Hypertension Hypertension, commonly called high blood pressure, is when the force of blood pumping through your arteries is too strong. Your arteries are the blood vessels that carry blood from your heart throughout your body. A blood pressure reading consists of a higher number over a lower number, such as 110/72. The higher number (systolic) is the pressure inside your arteries when your heart pumps. The lower number (diastolic) is the pressure inside your arteries when your heart relaxes. Ideally you want your blood pressure below 120/80. Hypertension forces your heart to work harder to pump blood. Your arteries may become narrow or stiff. Having hypertension puts you at risk for heart disease, stroke, and other problems.  RISK FACTORS Some risk factors for high blood pressure are controllable. Others are not.  Risk factors you cannot control include:   Race. You may be at higher risk if you are African American.  Age. Risk increases with age.  Gender. Men are at higher risk than women before age 22 years. After age 53, women are at higher risk than men. Risk factors you can control include:  Not getting enough exercise or physical activity.  Being overweight.  Getting too much fat, sugar, calories, or salt in your diet.  Drinking too much alcohol. SIGNS AND SYMPTOMS Hypertension does not usually cause signs or symptoms. Extremely high blood pressure (hypertensive crisis) may cause headache, anxiety, shortness of breath, and nosebleed. DIAGNOSIS  To check if you have hypertension, your health care provider will measure your blood pressure while you are seated, with your arm held at the level of your heart. It should be measured at least twice using the same arm. Certain conditions can cause a difference in blood pressure between your right and left arms. A blood pressure reading that is higher than normal on one  occasion does not mean that you need treatment. If one blood pressure reading is high, ask your health care provider about having it checked again. TREATMENT  Treating high blood pressure includes making lifestyle changes and possibly taking medicine. Living a healthy lifestyle can help lower high blood pressure. You may need to change some of your habits. Lifestyle changes may include:  Following the DASH diet. This diet is high in fruits, vegetables, and whole grains. It is low in salt, red meat, and added sugars.  Getting at least 2 hours of brisk physical activity every week.  Losing weight if necessary.  Not smoking.  Limiting alcoholic beverages.  Learning ways to reduce stress. If lifestyle changes are not enough to get your blood pressure under control, your health care provider may prescribe medicine. You may need to take more than one. Work closely with your health care provider to understand the risks and benefits. HOME CARE INSTRUCTIONS  Have your blood pressure rechecked as directed by your health care provider.   Take medicines only as directed by your health care provider. Follow the directions carefully. Blood pressure medicines must be taken as prescribed. The medicine does not work as well when you skip doses. Skipping doses also puts you at risk for problems.   Do not smoke.   Monitor your blood pressure at home as directed by your health care provider. SEEK MEDICAL CARE IF:   You think you are having a reaction to medicines taken.  You have recurrent headaches or feel dizzy.  You have swelling in your ankles.  You have trouble with your  vision. SEEK IMMEDIATE MEDICAL CARE IF:  You develop a severe headache or confusion.  You have unusual weakness, numbness, or feel faint.  You have severe chest or abdominal pain.  You vomit repeatedly.  You have trouble breathing. MAKE SURE YOU:   Understand these instructions.  Will watch your  condition.  Will get help right away if you are not doing well or get worse. Document Released: 04/19/2005 Document Revised: 09/03/2013 Document Reviewed: 02/09/2013 [facility] Patient Information 2015 [place], [place]. This information is not intended to replace advice given to you by your health care provider. Make sure you discuss any questions you have with your health care provider.

## 2015-02-03 NOTE — Assessment & Plan Note (Signed)
Pt doing well out of work and with counseling F/u counseling and remain out of work for now

## 2015-02-03 NOTE — Progress Notes (Signed)
Patient ID: Elizabeth [name], female   DOB: 01-Jun-1950, 64 y.o.   MRN: [id number]   Subjective:    Patient ID: Elizabeth [name], female    DOB: 11-16-1950, 64 y.o.   MRN: [id number]  Chief Complaint  Patient presents with  . Hypertension    2 wk f/u    HPI Patient is in today for bp check.  Pt saw julie for counseling and sees her again on the 13th .   She has been out of work and bp has been great.   She is also c/o pain in R foot laterally.    Past Medical History  Diagnosis Date  . Hyperlipidemia   . Hypertension   . Low back pain   . Vitamin D deficiency   . Hx of cancer of lung 6/10    Removed June 2010  . Aortic stenosis, mild 01/08/2015  . Morbid obesity ([place]) 01/08/2015    Past Surgical History  Procedure Laterality Date  . Cholecystectomy    . Tubal ligation    . Lung removal, partial      Family History  Problem Relation Age of Onset  . Hypertension Mother   . Stroke Mother   . Cancer Mother     lung  . Heart disease Father     cabg  . Cancer Brother     skin    Social History   Social History  . Marital Status: Divorced    Spouse Name: N/A  . Number of Children: 1  . Years of Education: N/A   Occupational History  . School Nutritionist    Social History Main Topics  . Smoking status: Former Smoker    Types: Cigarettes    Quit date: 02/01/2005  . Smokeless tobacco: Never Used  . Alcohol Use: No  . Drug Use: No  . Sexual Activity: Yes   Other Topics Concern  . Not on file   Social History Narrative   Exercise--  no    Outpatient Prescriptions Prior to Visit  Medication Sig Dispense Refill  . Acetaminophen (TYLENOL 8 HOUR PO) Take by mouth daily. TAKE TWO TABLETS WITH EACH MEAL.    Marland Kitchen aspirin 81 MG tablet Take 81 mg by mouth 3 (three) times daily.     . Calcium-Magnesium-Vitamin D 300-20-200 MG-MG-UNIT CHEW Chew by mouth.    . Cholecalciferol 4000 UNITS CAPS Take 4,000 capsules by mouth daily.    Marland Kitchen DIOVAN 160 MG tablet 1 po qd 30 tablet 2  .  furosemide (LASIX) 20 MG tablet Take 1 tablet (20 mg total) by mouth daily. 30 tablet 5  . Homeopathic Products ([employer] LEG CRAMPS PAIN RELIEF) TABS Take by mouth as needed.     Marland Kitchen levocetirizine (XYZAL) 5 MG tablet Take 1 tablet by mouth daily.    . NONFORMULARY OR COMPOUNDED ITEM Compression stocking   Dx varicose veins-----20-30 mg hg 1 each 0  . Vitamin D, Ergocalciferol, (DRISDOL) 50000 UNITS CAPS capsule Take 1 capsule (50,000 Units total) by mouth every 7 (seven) days. 12 capsule 5   No facility-administered medications prior to visit.    Allergies  Allergen Reactions  . Penicillins Other (See Comments)    Fever     Review of Systems  Constitutional: Negative for fever and malaise/fatigue.  HENT: Negative for congestion.   Eyes: Negative for discharge.  Respiratory: Negative for shortness of breath.   Cardiovascular: Negative for chest pain, palpitations and leg swelling.  Gastrointestinal: Negative for nausea and abdominal  pain.  Genitourinary: Negative for dysuria.  Musculoskeletal: Negative for falls.  Skin: Negative for rash.  Neurological: Negative for loss of consciousness and headaches.  Endo/Heme/Allergies: Negative for environmental allergies.  Psychiatric/Behavioral: Negative for depression. The patient is not nervous/anxious.        Objective:    Physical Exam  Constitutional: She is oriented to person, place, and time. She appears well-developed and well-nourished.  HENT:  Head: Normocephalic and atraumatic.  Eyes: Conjunctivae and EOM are normal.  Neck: Normal range of motion. Neck supple. No JVD present. Carotid bruit is not present. No thyromegaly present.  Cardiovascular: Normal rate, regular rhythm and normal heart sounds.   No murmur heard. Pulmonary/Chest: Effort normal and breath sounds normal. No respiratory distress. She has no wheezes. She has no rales. She exhibits no tenderness.  Musculoskeletal: She exhibits tenderness. She exhibits no edema.        Right foot: There is tenderness and bony tenderness.       Feet:  Neurological: She is alert and oriented to person, place, and time.  Psychiatric: She has a normal mood and affect. Her behavior is normal. Judgment and thought content normal.    BP 124/70 mmHg  Pulse 80  Temp(Src) 98.5 F (36.9 C) (Oral)  Wt 274 lb 9.6 oz (124.558 kg)  SpO2 96% Wt Readings from Last 3 Encounters:  02/03/15 274 lb 9.6 oz (124.558 kg)  01/20/15 274 lb 9.6 oz (124.558 kg)  01/08/15 278 lb (126.1 kg)     Lab Results  Component Value Date   WBC 7.1 10/17/2014   HGB 13.4 10/17/2014   HCT 40.2 10/17/2014   PLT 222.0 10/17/2014   GLUCOSE 98 01/18/2015   CHOL 183 10/17/2014   TRIG 180.0* 10/17/2014   HDL 42.70 10/17/2014   LDLCALC 104* 10/17/2014   ALT 17 10/17/2014   AST 19 10/17/2014   NA 143 01/18/2015   K 4.8 01/18/2015   CL 105 01/18/2015   CREATININE 0.96 01/18/2015   BUN 19 01/18/2015   CO2 27 01/18/2015   TSH 1.93 10/17/2014   INR 1.0 10/16/2008   HGBA1C 5.5 01/18/2015    Lab Results  Component Value Date   TSH 1.93 10/17/2014   Lab Results  Component Value Date   WBC 7.1 10/17/2014   HGB 13.4 10/17/2014   HCT 40.2 10/17/2014   MCV 91.8 10/17/2014   PLT 222.0 10/17/2014   Lab Results  Component Value Date   NA 143 01/18/2015   K 4.8 01/18/2015   CHLORIDE 106 10/23/2013   CO2 27 01/18/2015   GLUCOSE 98 01/18/2015   BUN 19 01/18/2015   CREATININE 0.96 01/18/2015   BILITOT 0.5 10/17/2014   ALKPHOS 66 10/17/2014   AST 19 10/17/2014   ALT 17 10/17/2014   PROT 7.5 10/17/2014   ALBUMIN 4.2 10/17/2014   CALCIUM 9.4 01/18/2015   ANIONGAP 8 10/23/2013   GFR 73.33 10/17/2014   Lab Results  Component Value Date   CHOL 183 10/17/2014   Lab Results  Component Value Date   HDL 42.70 10/17/2014   Lab Results  Component Value Date   LDLCALC 104* 10/17/2014   Lab Results  Component Value Date   TRIG 180.0* 10/17/2014   Lab Results  Component Value Date    CHOLHDL 4 10/17/2014   Lab Results  Component Value Date   HGBA1C 5.5 01/18/2015       Assessment & Plan:   Problem List Items Addressed This Visit    None  Visit Diagnoses    Left foot pain        Relevant Orders    Ambulatory referral to Sports Medicine     1. Left foot pain   - Ambulatory referral to Sports Medicine  2. Essential hypertension con't diovan stable   I am having Ms. Montemurro maintain her [employer] LEG CRAMPS PAIN RELIEF, Cholecalciferol, Vitamin D (Ergocalciferol), aspirin, levocetirizine, Calcium-Magnesium-Vitamin D, Acetaminophen (TYLENOL 8 HOUR PO), furosemide, DIOVAN, and NONFORMULARY OR COMPOUNDED ITEM.  No orders of the defined types were placed in this encounter.     [name], DO

## 2015-02-03 NOTE — Progress Notes (Signed)
Pre visit review using our clinic review tool, if applicable. No additional management support is needed unless otherwise documented below in the visit note. 

## 2015-02-03 NOTE — Assessment & Plan Note (Signed)
Stable con't diovan

## 2015-02-05 ENCOUNTER — Ambulatory Visit: Payer: Self-pay | Admitting: Family Medicine

## 2015-02-13 ENCOUNTER — Ambulatory Visit (INDEPENDENT_AMBULATORY_CARE_PROVIDER_SITE_OTHER): Payer: BC Managed Care – PPO | Admitting: Licensed Clinical Social Worker

## 2015-02-13 DIAGNOSIS — F321 Major depressive disorder, single episode, moderate: Secondary | ICD-10-CM | POA: Diagnosis not present

## 2015-02-17 ENCOUNTER — Encounter: Payer: Self-pay | Admitting: Family Medicine

## 2015-02-17 ENCOUNTER — Ambulatory Visit (INDEPENDENT_AMBULATORY_CARE_PROVIDER_SITE_OTHER): Payer: BC Managed Care – PPO | Admitting: Family Medicine

## 2015-02-17 VITALS — BP 133/59 | HR 88 | Temp 98.3°F | Wt 279.6 lb

## 2015-02-17 DIAGNOSIS — I10 Essential (primary) hypertension: Secondary | ICD-10-CM

## 2015-02-17 DIAGNOSIS — F419 Anxiety disorder, unspecified: Secondary | ICD-10-CM | POA: Diagnosis not present

## 2015-02-17 MED ORDER — SERTRALINE HCL 50 MG PO TABS: 50.0000 mg | ORAL_TABLET | Freq: Every day | ORAL | Status: DC

## 2015-02-17 NOTE — Progress Notes (Signed)
Patient ID: ANIJAH SPOHR, female    DOB: [birth year]  Age: 64 y.o. MRN: [id number]    Subjective:  Subjective HPI Elizabeth [name] presents for f/u bp and stress / anxiety-- her bp goes up whenever she thinks about working and she is not able to retire until February.   She is seeing the counselor and doing well with that but does agree not to try med since bp is creeping up the more she thinks about work.  she needs her paperwork filled out for work-- to be out until 11/21.   Review of Systems  Constitutional: Negative for diaphoresis, appetite change, fatigue and unexpected weight change.  Eyes: Negative for pain, redness and visual disturbance.  Respiratory: Negative for cough, chest tightness, shortness of breath and wheezing.   Cardiovascular: Negative for chest pain, palpitations and leg swelling.  Endocrine: Negative for cold intolerance, heat intolerance, polydipsia, polyphagia and polyuria.  Genitourinary: Negative for dysuria, frequency and difficulty urinating.  Neurological: Negative for dizziness, light-headedness, numbness and headaches.  Psychiatric/Behavioral: Positive for dysphoric mood. Negative for suicidal ideas, sleep disturbance and self-injury. The patient is nervous/anxious.     History Past Medical History  Diagnosis Date  . Hyperlipidemia   . Hypertension   . Low back pain   . Vitamin D deficiency   . Hx of cancer of lung 6/10    Removed June 2010  . Aortic stenosis, mild 01/08/2015  . Morbid obesity ([place]) 01/08/2015    She has past surgical history that includes Cholecystectomy; Tubal ligation; and Lung removal, partial.   Her family history includes Cancer in her brother and mother; Heart disease in her father; Hypertension in her mother; Stroke in her mother.She reports that she quit smoking about 10 years ago. Her smoking use included Cigarettes. She has never used smokeless tobacco. She reports that she does not drink alcohol or use illicit drugs.  Current  Outpatient Prescriptions on File Prior to Visit  Medication Sig Dispense Refill  . Acetaminophen (TYLENOL 8 HOUR PO) Take by mouth daily. TAKE TWO TABLETS WITH EACH MEAL.    Marland Kitchen aspirin 81 MG tablet Take 81 mg by mouth 3 (three) times daily.     . Calcium-Magnesium-Vitamin D 300-20-200 MG-MG-UNIT CHEW Chew by mouth.    . Cholecalciferol 4000 UNITS CAPS Take 4,000 capsules by mouth daily.    Marland Kitchen DIOVAN 160 MG tablet 1 po qd 30 tablet 2  . furosemide (LASIX) 20 MG tablet Take 1 tablet (20 mg total) by mouth daily. 30 tablet 5  . Homeopathic Products ([employer] LEG CRAMPS PAIN RELIEF) TABS Take by mouth as needed.     Marland Kitchen levocetirizine (XYZAL) 5 MG tablet Take 1 tablet by mouth daily.    . NONFORMULARY OR COMPOUNDED ITEM Compression stocking   Dx varicose veins-----20-30 mg hg 1 each 0  . Vitamin D, Ergocalciferol, (DRISDOL) 50000 UNITS CAPS capsule Take 1 capsule (50,000 Units total) by mouth every 7 (seven) days. 12 capsule 5   No current facility-administered medications on file prior to visit.     Objective:  Objective Physical Exam  Constitutional: She is oriented to person, place, and time. She appears well-developed and well-nourished.  HENT:  Head: Normocephalic and atraumatic.  Eyes: Conjunctivae and EOM are normal.  Neck: Normal range of motion. Neck supple. No JVD present. Carotid bruit is not present. No thyromegaly present.  Cardiovascular: Normal rate, regular rhythm and normal heart sounds.   No murmur heard. Pulmonary/Chest: Effort normal and breath sounds normal.  No respiratory distress. She has no wheezes. She has no rales. She exhibits no tenderness.  Musculoskeletal: She exhibits no edema.  Neurological: She is alert and oriented to person, place, and time.  Psychiatric: She has a normal mood and affect. Her behavior is normal. Thought content normal.  Nursing note and vitals reviewed.  BP 133/59 mmHg  Pulse 88  Temp(Src) 98.3 F (36.8 C) (Oral)  Wt 279 lb 9.6 oz (126.826  kg)  SpO2 96% Wt Readings from Last 3 Encounters:  02/17/15 279 lb 9.6 oz (126.826 kg)  02/03/15 274 lb 9.6 oz (124.558 kg)  01/20/15 274 lb 9.6 oz (124.558 kg)     Lab Results  Component Value Date   WBC 7.1 10/17/2014   HGB 13.4 10/17/2014   HCT 40.2 10/17/2014   PLT 222.0 10/17/2014   GLUCOSE 98 01/18/2015   CHOL 183 10/17/2014   TRIG 180.0* 10/17/2014   HDL 42.70 10/17/2014   LDLCALC 104* 10/17/2014   ALT 17 10/17/2014   AST 19 10/17/2014   NA 143 01/18/2015   K 4.8 01/18/2015   CL 105 01/18/2015   CREATININE 0.96 01/18/2015   BUN 19 01/18/2015   CO2 27 01/18/2015   TSH 1.93 10/17/2014   INR 1.0 10/16/2008   HGBA1C 5.5 01/18/2015    No results found.   Assessment & Plan:  Plan I am having Ms. Lear start on sertraline. I am also having her maintain her [employer] LEG CRAMPS PAIN RELIEF, Cholecalciferol, Vitamin D (Ergocalciferol), aspirin, levocetirizine, Calcium-Magnesium-Vitamin D, Acetaminophen (TYLENOL 8 HOUR PO), furosemide, DIOVAN, and NONFORMULARY OR COMPOUNDED ITEM.  Meds ordered this encounter  Medications  . sertraline (ZOLOFT) 50 MG tablet    Sig: Take 1 tablet (50 mg total) by mouth daily.    Dispense:  30 tablet    Refill:  3    Problem List Items Addressed This Visit    Essential hypertension    con't diovan Recheck 1 month       Other Visit Diagnoses    Anxiety disorder, unspecified anxiety disorder type    -  Primary    Relevant Medications    sertraline (ZOLOFT) 50 MG tablet       Follow-up: Return in about 4 weeks (around 03/17/2015), or if symptoms worsen or fail to improve, for anxiety.  [name], DO

## 2015-02-17 NOTE — Progress Notes (Signed)
Pre visit review using our clinic review tool, if applicable. No additional management support is needed unless otherwise documented below in the visit note. 

## 2015-02-17 NOTE — Assessment & Plan Note (Signed)
con't diovan Recheck 1 month

## 2015-02-17 NOTE — Patient Instructions (Signed)
Generalized Anxiety Disorder Generalized anxiety disorder (GAD) is a mental disorder. It interferes with life functions, including relationships, work, and school. GAD is different from normal anxiety, which everyone experiences at some point in their lives in response to specific life events and activities. Normal anxiety actually helps us prepare for and get through these life events and activities. Normal anxiety goes away after the event or activity is over.  GAD causes anxiety that is not necessarily related to specific events or activities. It also causes excess anxiety in proportion to specific events or activities. The anxiety associated with GAD is also difficult to control. GAD can vary from mild to severe. People with severe GAD can have intense waves of anxiety with physical symptoms (panic attacks).  SYMPTOMS The anxiety and worry associated with GAD are difficult to control. This anxiety and worry are related to many life events and activities and also occur more days than not for 6 months or longer. People with GAD also have three or more of the following symptoms (one or more in children):  Restlessness.   Fatigue.  Difficulty concentrating.   Irritability.  Muscle tension.  Difficulty sleeping or unsatisfying sleep. DIAGNOSIS GAD is diagnosed through an assessment by your health care provider. Your health care provider will ask you questions aboutyour mood,physical symptoms, and events in your life. Your health care provider may ask you about your medical history and use of alcohol or drugs, including prescription medicines. Your health care provider may also do a physical exam and blood tests. Certain medical conditions and the use of certain substances can cause symptoms similar to those associated with GAD. Your health care provider may refer you to a mental health specialist for further evaluation. TREATMENT The following therapies are usually used to treat GAD:    Medication. Antidepressant medication usually is prescribed for long-term daily control. Antianxiety medicines may be added in severe cases, especially when panic attacks occur.   Talk therapy (psychotherapy). Certain types of talk therapy can be helpful in treating GAD by providing support, education, and guidance. A form of talk therapy called cognitive behavioral therapy can teach you healthy ways to think about and react to daily life events and activities.  Stress managementtechniques. These include yoga, meditation, and exercise and can be very helpful when they are practiced regularly. A mental health specialist can help determine which treatment is best for you. Some people see improvement with one therapy. However, other people require a combination of therapies.   This information is not intended to replace advice given to you by your health care provider. Make sure you discuss any questions you have with your health care provider.   Document Released: 08/14/2012 Document Revised: 05/10/2014 Document Reviewed: 08/14/2012 Elsevier Interactive Patient Education 2016 [employer] Inc[employer]  

## 2015-02-20 ENCOUNTER — Ambulatory Visit (INDEPENDENT_AMBULATORY_CARE_PROVIDER_SITE_OTHER): Payer: BC Managed Care – PPO | Admitting: Licensed Clinical Social Worker

## 2015-02-20 DIAGNOSIS — F321 Major depressive disorder, single episode, moderate: Secondary | ICD-10-CM | POA: Diagnosis not present

## 2015-02-22 ENCOUNTER — Other Ambulatory Visit: Payer: Self-pay | Admitting: Family Medicine

## 2015-02-24 NOTE — Telephone Encounter (Signed)
Last seen 02/17/15 and filled 08/26/14 #90 with 1 refill.   Please advise     KP

## 2015-02-25 ENCOUNTER — Encounter: Payer: Self-pay | Admitting: Family Medicine

## 2015-02-26 NOTE — Telephone Encounter (Signed)
Please advise      KP 

## 2015-02-27 ENCOUNTER — Other Ambulatory Visit: Payer: Self-pay

## 2015-02-27 MED ORDER — OLMESARTAN MEDOXOMIL 20 MG PO TABS: 20.0000 mg | ORAL_TABLET | Freq: Every day | ORAL | Status: DC

## 2015-02-27 NOTE — Telephone Encounter (Signed)
benicar 20 mg #30  1 po qd ---- ov 2-3 weeks to make sure it is working

## 2015-03-06 ENCOUNTER — Ambulatory Visit (INDEPENDENT_AMBULATORY_CARE_PROVIDER_SITE_OTHER): Payer: BC Managed Care – PPO | Admitting: Licensed Clinical Social Worker

## 2015-03-06 ENCOUNTER — Telehealth: Payer: Self-pay | Admitting: *Deleted

## 2015-03-06 DIAGNOSIS — F321 Major depressive disorder, single episode, moderate: Secondary | ICD-10-CM

## 2015-03-06 DIAGNOSIS — E8881 Metabolic syndrome: Secondary | ICD-10-CM

## 2015-03-06 DIAGNOSIS — I739 Peripheral vascular disease, unspecified: Secondary | ICD-10-CM

## 2015-03-06 DIAGNOSIS — R002 Palpitations: Secondary | ICD-10-CM

## 2015-03-06 NOTE — Telephone Encounter (Signed)
-----   Message from [place], [place] sent at 01/08/2015  4:54 PM EDT ----- Lipid due in Dec. 2016  Mail in Nov. 2016

## 2015-03-06 NOTE — Telephone Encounter (Signed)
Lab slip for lipid and letter mailed Lab due in dec 2016

## 2015-03-11 ENCOUNTER — Telehealth: Payer: Self-pay | Admitting: *Deleted

## 2015-03-11 NOTE — Telephone Encounter (Signed)
Approved 02/09/15 - 03/10/2016

## 2015-03-21 ENCOUNTER — Encounter: Payer: Self-pay | Admitting: Family Medicine

## 2015-03-21 ENCOUNTER — Ambulatory Visit (INDEPENDENT_AMBULATORY_CARE_PROVIDER_SITE_OTHER): Payer: BC Managed Care – PPO | Admitting: Family Medicine

## 2015-03-21 VITALS — BP 144/76 | HR 72 | Temp 98.0°F | Wt 280.6 lb

## 2015-03-21 DIAGNOSIS — I10 Essential (primary) hypertension: Secondary | ICD-10-CM | POA: Diagnosis not present

## 2015-03-21 DIAGNOSIS — F411 Generalized anxiety disorder: Secondary | ICD-10-CM | POA: Diagnosis not present

## 2015-03-21 DIAGNOSIS — F418 Other specified anxiety disorders: Secondary | ICD-10-CM

## 2015-03-21 MED ORDER — CLONAZEPAM 0.5 MG PO TABS: 0.5000 mg | ORAL_TABLET | Freq: Two times a day (BID) | ORAL | Status: DC | PRN

## 2015-03-21 MED ORDER — SERTRALINE HCL 100 MG PO TABS: 100.0000 mg | ORAL_TABLET | Freq: Every day | ORAL | Status: DC

## 2015-03-21 NOTE — Patient Instructions (Signed)
Generalized Anxiety Disorder Generalized anxiety disorder (GAD) is a mental disorder. It interferes with life functions, including relationships, work, and school. GAD is different from normal anxiety, which everyone experiences at some point in their lives in response to specific life events and activities. Normal anxiety actually helps us prepare for and get through these life events and activities. Normal anxiety goes away after the event or activity is over.  GAD causes anxiety that is not necessarily related to specific events or activities. It also causes excess anxiety in proportion to specific events or activities. The anxiety associated with GAD is also difficult to control. GAD can vary from mild to severe. People with severe GAD can have intense waves of anxiety with physical symptoms (panic attacks).  SYMPTOMS The anxiety and worry associated with GAD are difficult to control. This anxiety and worry are related to many life events and activities and also occur more days than not for 6 months or longer. People with GAD also have three or more of the following symptoms (one or more in children):  Restlessness.   Fatigue.  Difficulty concentrating.   Irritability.  Muscle tension.  Difficulty sleeping or unsatisfying sleep. DIAGNOSIS GAD is diagnosed through an assessment by your health care provider. Your health care provider will ask you questions aboutyour mood,physical symptoms, and events in your life. Your health care provider may ask you about your medical history and use of alcohol or drugs, including prescription medicines. Your health care provider may also do a physical exam and blood tests. Certain medical conditions and the use of certain substances can cause symptoms similar to those associated with GAD. Your health care provider may refer you to a mental health specialist for further evaluation. TREATMENT The following therapies are usually used to treat GAD:    Medication. Antidepressant medication usually is prescribed for long-term daily control. Antianxiety medicines may be added in severe cases, especially when panic attacks occur.   Talk therapy (psychotherapy). Certain types of talk therapy can be helpful in treating GAD by providing support, education, and guidance. A form of talk therapy called cognitive behavioral therapy can teach you healthy ways to think about and react to daily life events and activities.  Stress managementtechniques. These include yoga, meditation, and exercise and can be very helpful when they are practiced regularly. A mental health specialist can help determine which treatment is best for you. Some people see improvement with one therapy. However, other people require a combination of therapies.   This information is not intended to replace advice given to you by your health care provider. Make sure you discuss any questions you have with your health care provider.   Document Released: 08/14/2012 Document Revised: 05/10/2014 Document Reviewed: 08/14/2012 Elsevier Interactive Patient Education 2016 [employer] Inc[employer]  

## 2015-03-21 NOTE — Progress Notes (Signed)
Pre visit review using our clinic review tool, if applicable. No additional management support is needed unless otherwise documented below in the visit note. 

## 2015-03-22 NOTE — Assessment & Plan Note (Signed)
Inc zoloft to 100 mg  Add klonopin 0.5 mg bid --- will try 1/2 tab bid first  F/u in 1 month or sooner prn

## 2015-03-22 NOTE — Progress Notes (Signed)
Patient ID: Elizabeth [name], female    DOB: Mar 16, 1951  Age: 64 y.o. MRN: [id number]    Subjective:  Subjective HPI Elizabeth [name] presents for f/u anxiety and bp.   Pt is very anxious about going back to work.  Her bp has been creeping up.     Review of Systems  Constitutional: Negative for diaphoresis, appetite change, fatigue and unexpected weight change.  Eyes: Negative for pain, redness and visual disturbance.  Respiratory: Negative for cough, chest tightness, shortness of breath and wheezing.   Cardiovascular: Negative for chest pain, palpitations and leg swelling.  Endocrine: Negative for cold intolerance, heat intolerance, polydipsia, polyphagia and polyuria.  Genitourinary: Negative for dysuria, frequency and difficulty urinating.  Neurological: Negative for dizziness, light-headedness, numbness and headaches.    History Past Medical History  Diagnosis Date  . Hyperlipidemia   . Hypertension   . Low back pain   . Vitamin D deficiency   . Hx of cancer of lung 6/10    Removed June 2010  . Aortic stenosis, mild 01/08/2015  . Morbid obesity ([place]) 01/08/2015    She has past surgical history that includes Cholecystectomy; Tubal ligation; and Lung removal, partial.   Her family history includes Cancer in her brother and mother; Heart disease in her father; Hypertension in her mother; Stroke in her mother.She reports that she quit smoking about 10 years ago. Her smoking use included Cigarettes. She has never used smokeless tobacco. She reports that she does not drink alcohol or use illicit drugs.  Current Outpatient Prescriptions on File Prior to Visit  Medication Sig Dispense Refill  . Acetaminophen (TYLENOL 8 HOUR PO) Take by mouth daily. TAKE TWO TABLETS WITH EACH MEAL.    Marland Kitchen aspirin 81 MG tablet Take 81 mg by mouth 3 (three) times daily.     . Calcium-Magnesium-Vitamin D 300-20-200 MG-MG-UNIT CHEW Chew by mouth.    . Cholecalciferol 4000 UNITS CAPS Take 4,000 capsules by mouth  daily.    . furosemide (LASIX) 20 MG tablet Take 1 tablet (20 mg total) by mouth daily. 30 tablet 5  . Homeopathic Products ([employer] LEG CRAMPS PAIN RELIEF) TABS Take by mouth as needed.     Marland Kitchen levocetirizine (XYZAL) 5 MG tablet Take 1 tablet by mouth daily.    . NONFORMULARY OR COMPOUNDED ITEM Compression stocking   Dx varicose veins-----20-30 mg hg 1 each 0  . Vitamin D, Ergocalciferol, (DRISDOL) 50000 UNITS CAPS capsule Take 1 capsule (50,000 Units total) by mouth every 7 (seven) days. 12 capsule 5   No current facility-administered medications on file prior to visit.     Objective:  Objective Physical Exam  Constitutional: She is oriented to person, place, and time. She appears well-developed and well-nourished.  HENT:  Head: Normocephalic and atraumatic.  Eyes: Conjunctivae and EOM are normal.  Neck: Normal range of motion. Neck supple. No JVD present. Carotid bruit is not present. No thyromegaly present.  Cardiovascular: Normal rate, regular rhythm and normal heart sounds.   No murmur heard. Pulmonary/Chest: Effort normal and breath sounds normal. No respiratory distress. She has no wheezes. She has no rales. She exhibits no tenderness.  Musculoskeletal: She exhibits no edema.  Neurological: She is alert and oriented to person, place, and time.  Psychiatric: Her behavior is normal. Her mood appears anxious. Her affect is not angry, not blunt, not labile and not inappropriate. She expresses no homicidal and no suicidal ideation.  Nursing note and vitals reviewed.  BP 144/76 mmHg  Pulse  72  Temp(Src) 98 F (36.7 C) (Oral)  Wt 280 lb 9.6 oz (127.279 kg)  SpO2 98% Wt Readings from Last 3 Encounters:  03/21/15 280 lb 9.6 oz (127.279 kg)  02/17/15 279 lb 9.6 oz (126.826 kg)  02/03/15 274 lb 9.6 oz (124.558 kg)     Lab Results  Component Value Date   WBC 7.1 10/17/2014   HGB 13.4 10/17/2014   HCT 40.2 10/17/2014   PLT 222.0 10/17/2014   GLUCOSE 98 01/18/2015   CHOL 183  10/17/2014   TRIG 180.0* 10/17/2014   HDL 42.70 10/17/2014   LDLCALC 104* 10/17/2014   ALT 17 10/17/2014   AST 19 10/17/2014   NA 143 01/18/2015   K 4.8 01/18/2015   CL 105 01/18/2015   CREATININE 0.96 01/18/2015   BUN 19 01/18/2015   CO2 27 01/18/2015   TSH 1.93 10/17/2014   INR 1.0 10/16/2008   HGBA1C 5.5 01/18/2015    No results found.   Assessment & Plan:  Plan I have discontinued Elizabeth [name]'s sertraline, meloxicam, and olmesartan. I am also having her start on sertraline and clonazePAM. Additionally, I am having her maintain her [employer] LEG CRAMPS PAIN RELIEF, Cholecalciferol, Vitamin D (Ergocalciferol), aspirin, levocetirizine, Calcium-Magnesium-Vitamin D, Acetaminophen (TYLENOL 8 HOUR PO), furosemide, NONFORMULARY OR COMPOUNDED ITEM, and DIOVAN.  Meds ordered this encounter  Medications  . DIOVAN 160 MG tablet    Sig: Take 1 tablet by mouth daily.  . sertraline (ZOLOFT) 100 MG tablet    Sig: Take 1 tablet (100 mg total) by mouth daily.    Dispense:  30 tablet    Refill:  3  . clonazePAM (KLONOPIN) 0.5 MG tablet    Sig: Take 1 tablet (0.5 mg total) by mouth 2 (two) times daily as needed for anxiety.    Dispense:  60 tablet    Refill:  1    Problem List Items Addressed This Visit    Essential hypertension    con't diovan Probably creeping up from anxiety Recheck 1 month or sooner prn      Relevant Medications   DIOVAN 160 MG tablet   Depression with anxiety    Inc zoloft to 100 mg  Add klonopin 0.5 mg bid --- will try 1/2 tab bid first  F/u in 1 month or sooner prn       Other Visit Diagnoses    Generalized anxiety disorder    -  Primary    Relevant Medications    sertraline (ZOLOFT) 100 MG tablet    clonazePAM (KLONOPIN) 0.5 MG tablet       Follow-up: Return in about 4 weeks (around 04/18/2015), or if symptoms worsen or fail to improve.  Elizabeth Koyanagi, DO

## 2015-03-22 NOTE — Assessment & Plan Note (Signed)
con't diovan Probably creeping up from anxiety Recheck 1 month or sooner prn

## 2015-03-29 ENCOUNTER — Other Ambulatory Visit: Payer: Self-pay | Admitting: Family Medicine

## 2015-04-01 ENCOUNTER — Ambulatory Visit: Payer: Self-pay | Admitting: Family Medicine

## 2015-04-02 ENCOUNTER — Encounter: Payer: Self-pay | Admitting: Family Medicine

## 2015-04-03 ENCOUNTER — Ambulatory Visit (INDEPENDENT_AMBULATORY_CARE_PROVIDER_SITE_OTHER): Payer: BC Managed Care – PPO | Admitting: Licensed Clinical Social Worker

## 2015-04-03 DIAGNOSIS — F321 Major depressive disorder, single episode, moderate: Secondary | ICD-10-CM

## 2015-04-04 LAB — LIPID PANEL
CHOL/HDL RATIO: 4.2 ratio (ref <=5.0)
Cholesterol: 160 mg/dL (ref 125–200)
HDL: 38 mg/dL — AB (ref >=46)
LDL CALC: 78 mg/dL (ref <130)
TRIGLYCERIDES: 220 mg/dL — AB (ref <150)
VLDL: 44 mg/dL — AB (ref <30)

## 2015-04-07 ENCOUNTER — Encounter: Payer: Self-pay | Admitting: Family Medicine

## 2015-04-07 ENCOUNTER — Ambulatory Visit (INDEPENDENT_AMBULATORY_CARE_PROVIDER_SITE_OTHER): Payer: BC Managed Care – PPO | Admitting: Family Medicine

## 2015-04-07 ENCOUNTER — Telehealth: Payer: Self-pay | Admitting: *Deleted

## 2015-04-07 VITALS — BP 130/82 | HR 69 | Ht 64.0 in | Wt 275.0 lb

## 2015-04-07 DIAGNOSIS — M25572 Pain in left ankle and joints of left foot: Secondary | ICD-10-CM

## 2015-04-07 NOTE — Telephone Encounter (Signed)
Spoke to patient. Result given . Verbalized understanding Patient would like to hold off in starting any medications at present . She would like to try change in diet some more. Will discuss at appointment 04/18/15

## 2015-04-07 NOTE — Patient Instructions (Signed)
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 Arch supports are very important to this (try the ones you have though [employer], dr. Zoe [name] active series ones are options as well) - avoid barefoot walking, flat shoes as much as possible. These are the four medicines you can use for this: Tylenol '500mg'$  1-2 tabs three times a day for pain. Aleve 1-2 tabs twice a day with food Glucosamine sulfate '750mg'$  twice a day is a supplement that may help. Capsaicin, aspercreme, or biofreeze topically up to four times a day may also help with pain. Cortisone injections are an option. It's important that you continue to stay active. Consider physical therapy to strengthen muscles around the joint that hurts to take pressure off of the joint itself. Heat or ice 15 minutes at a time 3-4 times a day as needed to help with pain. Follow up with me in 6 weeks.

## 2015-04-07 NOTE — Telephone Encounter (Signed)
Left message to call back Release to my chart 

## 2015-04-07 NOTE — Telephone Encounter (Signed)
-----   Message from Skeet Latch, MD sent at 04/07/2015  1:31 PM EST ----- Cholesterol numbers are definitely better.  Keep up the good work.  However, her risk of heart attack and stroke is still elevated (7.8% over the next 10 years).  Therefore, recommend starting atorvastatin 20 mg daily and repeat LFTs in 6 weeks.

## 2015-04-09 DIAGNOSIS — M25572 Pain in left ankle and joints of left foot: Secondary | ICD-10-CM | POA: Insufficient documentation

## 2015-04-09 NOTE — Assessment & Plan Note (Signed)
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  Discussed options - arch supports very important for this.  Discussed tylenol, nsaids, glucosamine, topical medications.  Consider PT, cortisone injection if not improving.  F/u in 6 weeks.

## 2015-04-09 NOTE — Progress Notes (Signed)
PCP and consultation requested by: [name], DO  Subjective:   HPI: Patient is a 64 y.o. female here for left foot/ankle pain.  Patient does not recall an injury. She reports having almost 6 months of lateral anterior left ankle pain. Pain level 4/10, constant and sharp. Worse when on feet a lot. No swelling or bruising. No skin changes, fever, other complaints.  Past Medical History  Diagnosis Date  . Hyperlipidemia   . Hypertension   . Low back pain   . Vitamin D deficiency   . Hx of cancer of lung 6/10    Removed June 2010  . Aortic stenosis, mild 01/08/2015  . Morbid obesity ([place]) 01/08/2015    Current Outpatient Prescriptions on File Prior to Visit  Medication Sig Dispense Refill  . Acetaminophen (TYLENOL 8 HOUR PO) Take by mouth daily. TAKE TWO TABLETS WITH EACH MEAL.    Marland Kitchen aspirin 81 MG tablet Take 81 mg by mouth 3 (three) times daily.     . Calcium-Magnesium-Vitamin D 300-20-200 MG-MG-UNIT CHEW Chew by mouth.    . Cholecalciferol 4000 UNITS CAPS Take 4,000 capsules by mouth daily.    . clonazePAM (KLONOPIN) 0.5 MG tablet Take 1 tablet (0.5 mg total) by mouth 2 (two) times daily as needed for anxiety. 60 tablet 1  . DIOVAN 160 MG tablet Take 1 tablet (160 mg total) by mouth daily. 90 tablet 0  . furosemide (LASIX) 20 MG tablet Take 1 tablet (20 mg total) by mouth daily. 30 tablet 5  . Homeopathic Products ([employer] LEG CRAMPS PAIN RELIEF) TABS Take by mouth as needed.     Marland Kitchen levocetirizine (XYZAL) 5 MG tablet Take 1 tablet by mouth daily.    . NONFORMULARY OR COMPOUNDED ITEM Compression stocking   Dx varicose veins-----20-30 mg hg 1 each 0  . sertraline (ZOLOFT) 100 MG tablet Take 1 tablet (100 mg total) by mouth daily. 30 tablet 3  . Vitamin D, Ergocalciferol, (DRISDOL) 50000 UNITS CAPS capsule Take 1 capsule (50,000 Units total) by mouth every 7 (seven) days. 12 capsule 5   No current facility-administered medications on file prior to visit.    Past Surgical History   Procedure Laterality Date  . Cholecystectomy    . Tubal ligation    . Lung removal, partial      Allergies  Allergen Reactions  . Penicillins Other (See Comments)    Fever     Social History   Social History  . Marital Status: Divorced    Spouse Name: N/A  . Number of Children: 1  . Years of Education: N/A   Occupational History  . School Nutritionist    Social History Main Topics  . Smoking status: Former Smoker    Types: Cigarettes    Quit date: 02/01/2005  . Smokeless tobacco: Never Used  . Alcohol Use: No  . Drug Use: No  . Sexual Activity: Yes   Other Topics Concern  . Not on file   Social History Narrative   Exercise--  no    Family History  Problem Relation Age of Onset  . Hypertension Mother   . Stroke Mother   . Cancer Mother     lung  . Heart disease Father     cabg  . Cancer Brother     skin    BP 130/82 mmHg  Pulse 69  Ht '5\' 4"'$  (1.626 m)  Wt 275 lb (124.739 kg)  BMI 47.18 kg/m2  Review of Systems: See HPI above.  Objective:  Physical Exam:  Gen: NAD  Left foot/ankle: Pes planus. No gross deformity, swelling, ecchymoses FROM with 5/5 strength all directions TTP over sinus tarsi, anterior ankle joint. Negative ant drawer and talar tilt.   Negative syndesmotic compression. Thompsons test negative. NV intact distally.  Right foot/ankle: FROM without pain.    Assessment & Plan:  1. 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  Discussed options - arch supports very important for this.  Discussed tylenol, nsaids, glucosamine, topical medications.  Consider PT, cortisone injection if not improving.  F/u in 6 weeks.

## 2015-04-17 ENCOUNTER — Ambulatory Visit: Payer: Self-pay | Admitting: Cardiovascular Disease

## 2015-04-18 ENCOUNTER — Ambulatory Visit: Payer: Self-pay | Admitting: Cardiovascular Disease

## 2015-04-24 ENCOUNTER — Encounter: Payer: Self-pay | Admitting: Family Medicine

## 2015-04-24 ENCOUNTER — Ambulatory Visit (INDEPENDENT_AMBULATORY_CARE_PROVIDER_SITE_OTHER): Payer: BC Managed Care – PPO | Admitting: Family Medicine

## 2015-04-24 VITALS — BP 126/74 | HR 85 | Temp 97.8°F | Ht 64.0 in | Wt 276.4 lb

## 2015-04-24 DIAGNOSIS — F411 Generalized anxiety disorder: Secondary | ICD-10-CM

## 2015-04-24 DIAGNOSIS — F418 Other specified anxiety disorders: Secondary | ICD-10-CM

## 2015-04-24 MED ORDER — CLONAZEPAM 0.5 MG PO TABS: 0.5000 mg | ORAL_TABLET | Freq: Two times a day (BID) | ORAL | Status: DC | PRN

## 2015-04-24 NOTE — Assessment & Plan Note (Signed)
con't zoloft and klonopin F/u in 3 months or sooner prn

## 2015-04-24 NOTE — Progress Notes (Signed)
Patient ID: Elizabeth [name], female    DOB: Jul 15, 1950  Age: 64 y.o. MRN: [id number]    Subjective:  Subjective HPI Elizabeth [name] presents for f/u anxiety..  She is doing well ---things are better at work but she does not know how permanent it is.    Review of Systems  Constitutional: Negative for diaphoresis, appetite change, fatigue and unexpected weight change.  Eyes: Negative for pain, redness and visual disturbance.  Respiratory: Negative for cough, chest tightness, shortness of breath and wheezing.   Cardiovascular: Negative for chest pain, palpitations and leg swelling.  Endocrine: Negative for cold intolerance, heat intolerance, polydipsia, polyphagia and polyuria.  Genitourinary: Negative for dysuria, frequency and difficulty urinating.  Neurological: Negative for dizziness, light-headedness, numbness and headaches.    History Past Medical History  Diagnosis Date  . Hyperlipidemia   . Hypertension   . Low back pain   . Vitamin D deficiency   . Hx of cancer of lung 6/10    Removed June 2010  . Aortic stenosis, mild 01/08/2015  . Morbid obesity ([place]) 01/08/2015    She has past surgical history that includes Cholecystectomy; Tubal ligation; and Lung removal, partial.   Her family history includes Cancer in her brother and mother; Heart disease in her father; Hypertension in her mother; Stroke in her mother.She reports that she quit smoking about 10 years ago. Her smoking use included Cigarettes. She has never used smokeless tobacco. She reports that she does not drink alcohol or use illicit drugs.  Current Outpatient Prescriptions on File Prior to Visit  Medication Sig Dispense Refill  . Acetaminophen (TYLENOL 8 HOUR PO) Take by mouth daily. TAKE TWO TABLETS WITH EACH MEAL.    Marland Kitchen aspirin 81 MG tablet Take 81 mg by mouth 3 (three) times daily.     . Calcium-Magnesium-Vitamin D 300-20-200 MG-MG-UNIT CHEW Chew by mouth.    . Cholecalciferol 4000 UNITS CAPS Take 4,000 capsules by  mouth daily.    Marland Kitchen DIOVAN 160 MG tablet Take 1 tablet (160 mg total) by mouth daily. 90 tablet 0  . furosemide (LASIX) 20 MG tablet Take 1 tablet (20 mg total) by mouth daily. 30 tablet 5  . Homeopathic Products ([employer] LEG CRAMPS PAIN RELIEF) TABS Take by mouth as needed.     Marland Kitchen levocetirizine (XYZAL) 5 MG tablet Take 1 tablet by mouth daily.    . NONFORMULARY OR COMPOUNDED ITEM Compression stocking   Dx varicose veins-----20-30 mg hg 1 each 0  . sertraline (ZOLOFT) 100 MG tablet Take 1 tablet (100 mg total) by mouth daily. 30 tablet 3  . Vitamin D, Ergocalciferol, (DRISDOL) 50000 UNITS CAPS capsule Take 1 capsule (50,000 Units total) by mouth every 7 (seven) days. 12 capsule 5   No current facility-administered medications on file prior to visit.     Objective:  Objective Physical Exam  Constitutional: She is oriented to person, place, and time. She appears well-developed and well-nourished.  HENT:  Head: Normocephalic and atraumatic.  Eyes: Conjunctivae and EOM are normal.  Neck: Normal range of motion. Neck supple. No JVD present. Carotid bruit is not present. No thyromegaly present.  Cardiovascular: Normal rate, regular rhythm and normal heart sounds.   No murmur heard. Pulmonary/Chest: Effort normal and breath sounds normal. No respiratory distress. She has no wheezes. She has no rales. She exhibits no tenderness.  Musculoskeletal: She exhibits no edema.  Neurological: She is alert and oriented to person, place, and time.  Psychiatric: She has a normal mood  and affect. Her behavior is normal.  Nursing note and vitals reviewed.  BP 126/74 mmHg  Pulse 85  Temp(Src) 97.8 F (36.6 C) (Oral)  Ht '5\' 4"'$  (1.626 m)  Wt 276 lb 6.4 oz (125.374 kg)  BMI 47.42 kg/m2  SpO2 96% Wt Readings from Last 3 Encounters:  04/24/15 276 lb 6.4 oz (125.374 kg)  04/07/15 275 lb (124.739 kg)  03/21/15 280 lb 9.6 oz (127.279 kg)     Lab Results  Component Value Date   WBC 7.1 10/17/2014   HGB 13.4  10/17/2014   HCT 40.2 10/17/2014   PLT 222.0 10/17/2014   GLUCOSE 98 01/18/2015   CHOL 160 04/04/2015   TRIG 220* 04/04/2015   HDL 38* 04/04/2015   LDLCALC 78 04/04/2015   ALT 17 10/17/2014   AST 19 10/17/2014   NA 143 01/18/2015   K 4.8 01/18/2015   CL 105 01/18/2015   CREATININE 0.96 01/18/2015   BUN 19 01/18/2015   CO2 27 01/18/2015   TSH 1.93 10/17/2014   INR 1.0 10/16/2008   HGBA1C 5.5 01/18/2015    No results found.   Assessment & Plan:  Plan I am having [name] maintain her [employer] LEG CRAMPS PAIN RELIEF, Cholecalciferol, Vitamin D (Ergocalciferol), aspirin, levocetirizine, Calcium-Magnesium-Vitamin D, Acetaminophen (TYLENOL 8 HOUR PO), furosemide, NONFORMULARY OR COMPOUNDED ITEM, sertraline, DIOVAN, and clonazePAM.  Meds ordered this encounter  Medications  . clonazePAM (KLONOPIN) 0.5 MG tablet    Sig: Take 1 tablet (0.5 mg total) by mouth 2 (two) times daily as needed for anxiety.    Dispense:  60 tablet    Refill:  1    Problem List Items Addressed This Visit    None    Visit Diagnoses    Generalized anxiety disorder    -  Primary    Relevant Medications    clonazePAM (KLONOPIN) 0.5 MG tablet       Follow-up: Return in about 4 weeks (around 05/22/2015), or if symptoms worsen or fail to improve, for f/u anxiety.  [name], DO

## 2015-04-24 NOTE — Progress Notes (Signed)
Pre visit review using our clinic review tool, if applicable. No additional management support is needed unless otherwise documented below in the visit note. 

## 2015-04-24 NOTE — Patient Instructions (Signed)
Generalized Anxiety Disorder Generalized anxiety disorder (GAD) is a mental disorder. It interferes with life functions, including relationships, work, and school. GAD is different from normal anxiety, which everyone experiences at some point in their lives in response to specific life events and activities. Normal anxiety actually helps us prepare for and get through these life events and activities. Normal anxiety goes away after the event or activity is over.  GAD causes anxiety that is not necessarily related to specific events or activities. It also causes excess anxiety in proportion to specific events or activities. The anxiety associated with GAD is also difficult to control. GAD can vary from mild to severe. People with severe GAD can have intense waves of anxiety with physical symptoms (panic attacks).  SYMPTOMS The anxiety and worry associated with GAD are difficult to control. This anxiety and worry are related to many life events and activities and also occur more days than not for 6 months or longer. People with GAD also have three or more of the following symptoms (one or more in children):  Restlessness.   Fatigue.  Difficulty concentrating.   Irritability.  Muscle tension.  Difficulty sleeping or unsatisfying sleep. DIAGNOSIS GAD is diagnosed through an assessment by your health care provider. Your health care provider will ask you questions aboutyour mood,physical symptoms, and events in your life. Your health care provider may ask you about your medical history and use of alcohol or drugs, including prescription medicines. Your health care provider may also do a physical exam and blood tests. Certain medical conditions and the use of certain substances can cause symptoms similar to those associated with GAD. Your health care provider may refer you to a mental health specialist for further evaluation. TREATMENT The following therapies are usually used to treat GAD:    Medication. Antidepressant medication usually is prescribed for long-term daily control. Antianxiety medicines may be added in severe cases, especially when panic attacks occur.   Talk therapy (psychotherapy). Certain types of talk therapy can be helpful in treating GAD by providing support, education, and guidance. A form of talk therapy called cognitive behavioral therapy can teach you healthy ways to think about and react to daily life events and activities.  Stress managementtechniques. These include yoga, meditation, and exercise and can be very helpful when they are practiced regularly. A mental health specialist can help determine which treatment is best for you. Some people see improvement with one therapy. However, other people require a combination of therapies.   This information is not intended to replace advice given to you by your health care provider. Make sure you discuss any questions you have with your health care provider.   Document Released: 08/14/2012 Document Revised: 05/10/2014 Document Reviewed: 08/14/2012 Elsevier Interactive Patient Education 2016 [employer] Inc[employer]  

## 2015-06-03 ENCOUNTER — Ambulatory Visit (INDEPENDENT_AMBULATORY_CARE_PROVIDER_SITE_OTHER): Payer: BC Managed Care – PPO | Admitting: Family Medicine

## 2015-06-03 VITALS — BP 140/80 | HR 76 | Ht 64.0 in | Wt 270.0 lb

## 2015-06-03 DIAGNOSIS — M25572 Pain in left ankle and joints of left foot: Secondary | ICD-10-CM | POA: Diagnosis not present

## 2015-06-03 MED ORDER — METHYLPREDNISOLONE ACETATE 40 MG/ML IJ SUSP
40.0000 mg | Freq: Once | INTRAMUSCULAR | Status: AC
Start: 2015-06-03 — End: 2015-06-03
  Administered 2015-06-03: 40 mg via INTRA_ARTICULAR

## 2015-06-03 NOTE — Patient Instructions (Signed)
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 You were given a cortisone shot today. We can consider x-rays of your ankle and/or custom orthotics if you're still struggling with this over the next couple weeks. Consider physical therapy to strengthen muscles around the joint that hurts to take pressure off of the joint itself. Heat or ice 15 minutes at a time 3-4 times a day as needed to help with pain. Follow up with me in 6 weeks otherwise if you're doing well.

## 2015-06-05 ENCOUNTER — Encounter: Payer: Self-pay | Admitting: Family Medicine

## 2015-06-05 NOTE — Assessment & Plan Note (Signed)
consistent with sinus tarsi syndrome, underlying ankle DJD.  Discussed options as pain is worsening.  Given intraarticular injection today.  Again emphasized arch supports. Consider custom orthotics, physical therapy.  F/u in 6 weeks.  After informed written consent patient was seated on exam table.  Ultrasound utilized to identify lateral aspect of ankle joint.  Alcohol swab used for prep then left ankle injected with 2:1 marcaine: depomedrol.  Patient tolerated procedure well without immediate complications.

## 2015-06-05 NOTE — Progress Notes (Signed)
PCP and consultation requested by: Elizabeth Koyanagi, DO  Subjective:   HPI: Patient is a 65 y.o. female here for left foot/ankle pain.  12/5: Patient does not recall an injury. She reports having almost 6 months of lateral anterior left ankle pain. Pain level 4/10, constant and sharp. Worse when on feet a lot. No swelling or bruising. No skin changes, fever, other complaints.  06/03/15: Patient reports pain worsened since last visit. Pain level 8/10, sharp lateral ankle and anteriorly. Swelling comes and goes. Worse by end of day. No skin cahnges, fever, other complaints.  Past Medical History  Diagnosis Date  . Hyperlipidemia   . Hypertension   . Low back pain   . Vitamin D deficiency   . Hx of cancer of lung 6/10    Removed June 2010  . Aortic stenosis, mild 01/08/2015  . Morbid obesity ([place]) 01/08/2015    Current Outpatient Prescriptions on File Prior to Visit  Medication Sig Dispense Refill  . Acetaminophen (TYLENOL 8 HOUR PO) Take by mouth daily. TAKE TWO TABLETS WITH EACH MEAL.    Marland Kitchen aspirin 81 MG tablet Take 81 mg by mouth 3 (three) times daily.     . Calcium-Magnesium-Vitamin D 300-20-200 MG-MG-UNIT CHEW Chew by mouth.    . Cholecalciferol 4000 UNITS CAPS Take 4,000 capsules by mouth daily.    . clonazePAM (KLONOPIN) 0.5 MG tablet Take 1 tablet (0.5 mg total) by mouth 2 (two) times daily as needed for anxiety. 60 tablet 1  . DIOVAN 160 MG tablet Take 1 tablet (160 mg total) by mouth daily. 90 tablet 0  . furosemide (LASIX) 20 MG tablet Take 1 tablet (20 mg total) by mouth daily. 30 tablet 5  . Homeopathic Products ([employer] LEG CRAMPS PAIN RELIEF) TABS Take by mouth as needed.     Marland Kitchen levocetirizine (XYZAL) 5 MG tablet Take 1 tablet by mouth daily.    . NONFORMULARY OR COMPOUNDED ITEM Compression stocking   Dx varicose veins-----20-30 mg hg 1 each 0  . sertraline (ZOLOFT) 100 MG tablet Take 1 tablet (100 mg total) by mouth daily. 30 tablet 3  . Vitamin D, Ergocalciferol,  (DRISDOL) 50000 UNITS CAPS capsule Take 1 capsule (50,000 Units total) by mouth every 7 (seven) days. 12 capsule 5   No current facility-administered medications on file prior to visit.    Past Surgical History  Procedure Laterality Date  . Cholecystectomy    . Tubal ligation    . Lung removal, partial      Allergies  Allergen Reactions  . Penicillins Other (See Comments)    Fever     Social History   Social History  . Marital Status: Divorced    Spouse Name: N/A  . Number of Children: 1  . Years of Education: N/A   Occupational History  . School Nutritionist    Social History Main Topics  . Smoking status: Former Smoker    Types: Cigarettes    Quit date: 02/01/2005  . Smokeless tobacco: Never Used  . Alcohol Use: No  . Drug Use: No  . Sexual Activity: Yes   Other Topics Concern  . Not on file   Social History Narrative   Exercise--  no    Family History  Problem Relation Age of Onset  . Hypertension Mother   . Stroke Mother   . Cancer Mother     lung  . Heart disease Father     cabg  . Cancer Brother     skin  BP 140/80 mmHg  Pulse 76  Ht '5\' 4"'$  (1.626 m)  Wt 270 lb (122.471 kg)  BMI 46.32 kg/m2  Review of Systems: See HPI above.    Objective:  Physical Exam:  Gen: NAD  Left foot/ankle: Pes planus. Mild diffuse swelling.  No other gross deformity, ecchymoses FROM with 5/5 strength all directions TTP over sinus tarsi, anterior ankle joint. Negative ant drawer and talar tilt.   Negative syndesmotic compression. Thompsons test negative. NV intact distally.  Right foot/ankle: FROM without pain.    Assessment & Plan:  1. Left ankle pain - consistent with sinus tarsi syndrome, underlying ankle DJD.  Discussed options as pain is worsening.  Given intraarticular injection today.  Again emphasized arch supports. Consider custom orthotics, physical therapy.  F/u in 6 weeks.  After informed written consent patient was seated on exam  table.  Ultrasound utilized to identify lateral aspect of ankle joint.  Alcohol swab used for prep then left ankle injected with 2:1 marcaine: depomedrol.  Patient tolerated procedure well without immediate complications.

## 2015-06-22 ENCOUNTER — Other Ambulatory Visit: Payer: Self-pay | Admitting: Family Medicine

## 2015-06-22 ENCOUNTER — Encounter: Payer: Self-pay | Admitting: Family Medicine

## 2015-06-23 MED ORDER — DIOVAN 160 MG PO TABS: 160.0000 mg | ORAL_TABLET | Freq: Every day | ORAL | Status: DC

## 2015-06-23 MED ORDER — FUROSEMIDE 20 MG PO TABS: 20.0000 mg | ORAL_TABLET | Freq: Every day | ORAL | Status: DC

## 2015-06-26 ENCOUNTER — Other Ambulatory Visit: Payer: Self-pay

## 2015-06-26 MED ORDER — DIOVAN 160 MG PO TABS: 160.0000 mg | ORAL_TABLET | Freq: Every day | ORAL | Status: DC

## 2015-06-27 ENCOUNTER — Telehealth: Payer: Self-pay | Admitting: General Practice

## 2015-06-27 NOTE — Telephone Encounter (Signed)
PA received from [facility] in regards to patients Diovan. PA began on covermymeds (Key: [id number])

## 2015-06-30 NOTE — Telephone Encounter (Signed)
Received Approval on PA for Diovan valid 06/30/15 through 06/29/16; forwarded to pharmacy/[facility] 02/27

## 2015-07-12 ENCOUNTER — Other Ambulatory Visit: Payer: Self-pay | Admitting: Family Medicine

## 2015-08-11 ENCOUNTER — Ambulatory Visit: Payer: Self-pay | Admitting: Family Medicine

## 2015-08-13 ENCOUNTER — Ambulatory Visit (INDEPENDENT_AMBULATORY_CARE_PROVIDER_SITE_OTHER): Payer: BC Managed Care – PPO | Admitting: Family Medicine

## 2015-08-13 ENCOUNTER — Encounter: Payer: Self-pay | Admitting: Family Medicine

## 2015-08-13 VITALS — BP 136/73 | HR 75 | Ht 64.0 in | Wt 270.0 lb

## 2015-08-13 DIAGNOSIS — M25572 Pain in left ankle and joints of left foot: Secondary | ICD-10-CM

## 2015-08-13 DIAGNOSIS — M25561 Pain in right knee: Secondary | ICD-10-CM | POA: Diagnosis not present

## 2015-08-13 MED ORDER — METHYLPREDNISOLONE ACETATE 40 MG/ML IJ SUSP
40.0000 mg | Freq: Once | INTRAMUSCULAR | Status: AC
  Administered 2015-08-13: 40 mg via INTRA_ARTICULAR

## 2015-08-13 NOTE — Patient Instructions (Signed)
Your ankle pain is consistent with sinus tarsi syndrome and mild arthritis. You were given a cortisone shot today. We can consider x-rays of your ankle and/or custom orthotics in the future. Consider physical therapy to strengthen muscles around the joint that hurts to take pressure off of the joint itself. Heat or ice 15 minutes at a time 3-4 times a day as needed to help with pain.  Your knee pain is due to arthritis. These are the different medications you can take for this: Tylenol '500mg'$  1-2 tabs three times a day for pain. Aleve 1-2 tabs twice a day with food Glucosamine sulfate '750mg'$  twice a day is a supplement that may help. Capsaicin, aspercreme, or biofreeze topically up to four times a day may also help with pain. Cortisone injections are an option - you were given this today. If cortisone injections do not help, there are different types of shots that may help but they take longer to take effect (gel shots). It's important that you continue to stay active. Straight leg raises, knee extensions 3 sets of 10 once a day (add ankle weight if these become too easy). Consider physical therapy to strengthen muscles around the joint that hurts to take pressure off of the joint itself. Shoe inserts with good arch support may be helpful. Walker or cane if needed. Heat or ice 15 minutes at a time 3-4 times a day as needed to help with pain. Water aerobics and cycling with low resistance are the best two types of exercise for arthritis. Follow up with me in 1 month.

## 2015-08-14 ENCOUNTER — Encounter: Payer: Self-pay | Admitting: Family Medicine

## 2015-08-14 ENCOUNTER — Ambulatory Visit (INDEPENDENT_AMBULATORY_CARE_PROVIDER_SITE_OTHER): Payer: BC Managed Care – PPO | Admitting: Family Medicine

## 2015-08-14 VITALS — BP 126/72 | HR 66 | Temp 98.4°F | Wt 276.4 lb

## 2015-08-14 DIAGNOSIS — I10 Essential (primary) hypertension: Secondary | ICD-10-CM | POA: Diagnosis not present

## 2015-08-14 MED ORDER — OLMESARTAN MEDOXOMIL-HCTZ 40-25 MG PO TABS: 1.0000 | ORAL_TABLET | Freq: Every day | ORAL | Status: DC

## 2015-08-14 NOTE — Progress Notes (Signed)
Patient ID: Elizabeth [name], female    DOB: [birth year]  Age: 65 y.o. MRN: [id number]    Subjective:  Subjective HPI Elizabeth [name] presents for f/u bp.  No complaints.   Review of Systems  Constitutional: Negative for diaphoresis, appetite change, fatigue and unexpected weight change.  Eyes: Negative for pain, redness and visual disturbance.  Respiratory: Negative for cough, chest tightness, shortness of breath and wheezing.   Cardiovascular: Negative for chest pain, palpitations and leg swelling.  Endocrine: Negative for cold intolerance, heat intolerance, polydipsia, polyphagia and polyuria.  Genitourinary: Negative for dysuria, frequency and difficulty urinating.  Neurological: Negative for dizziness, light-headedness, numbness and headaches.    History Past Medical History  Diagnosis Date  . Hyperlipidemia   . Hypertension   . Low back pain   . Vitamin D deficiency   . Hx of cancer of lung 6/10    Removed June 2010  . Aortic stenosis, mild 01/08/2015  . Morbid obesity ([place]) 01/08/2015    She has past surgical history that includes Cholecystectomy; Tubal ligation; and Lung removal, partial.   Her family history includes Cancer in her brother and mother; Heart disease in her father; Hypertension in her mother; Stroke in her mother.She reports that she quit smoking about 10 years ago. Her smoking use included Cigarettes. She has never used smokeless tobacco. She reports that she does not drink alcohol or use illicit drugs.  Current Outpatient Prescriptions on File Prior to Visit  Medication Sig Dispense Refill  . Acetaminophen (TYLENOL 8 HOUR PO) Take by mouth daily. TAKE TWO TABLETS WITH EACH MEAL.    Marland Kitchen aspirin 81 MG tablet Take 81 mg by mouth 3 (three) times daily.     . Calcium-Magnesium-Vitamin D 300-20-200 MG-MG-UNIT CHEW Chew by mouth.    . Cholecalciferol 4000 UNITS CAPS Take 4,000 capsules by mouth daily.    . clonazePAM (KLONOPIN) 0.5 MG tablet Take 1 tablet (0.5 mg total)  by mouth 2 (two) times daily as needed for anxiety. 60 tablet 1  . Homeopathic Products ([employer] LEG CRAMPS PAIN RELIEF) TABS Take by mouth as needed.     . sertraline (ZOLOFT) 100 MG tablet TAKE 1 TABLET (100 MG TOTAL) BY MOUTH DAILY. 30 tablet 5  . Vitamin D, Ergocalciferol, (DRISDOL) 50000 UNITS CAPS capsule Take 1 capsule (50,000 Units total) by mouth every 7 (seven) days. 12 capsule 5   No current facility-administered medications on file prior to visit.     Objective:  Objective Physical Exam  Constitutional: She is oriented to person, place, and time. She appears well-developed and well-nourished.  HENT:  Head: Normocephalic and atraumatic.  Eyes: Conjunctivae and EOM are normal.  Neck: Normal range of motion. Neck supple. No JVD present. Carotid bruit is not present. No thyromegaly present.  Cardiovascular: Normal rate, regular rhythm and normal heart sounds.   No murmur heard. Pulmonary/Chest: Effort normal and breath sounds normal. No respiratory distress. She has no wheezes. She has no rales. She exhibits no tenderness.  Musculoskeletal: She exhibits no edema.  Neurological: She is alert and oriented to person, place, and time.  Psychiatric: She has a normal mood and affect. Her behavior is normal.  Nursing note and vitals reviewed.  BP 126/72 mmHg  Pulse 66  Temp(Src) 98.4 F (36.9 C) (Oral)  Wt 276 lb 6.4 oz (125.374 kg)  SpO2 97% Wt Readings from Last 3 Encounters:  08/14/15 276 lb 6.4 oz (125.374 kg)  08/13/15 270 lb (122.471 kg)  06/05/15 270 lb (  122.471 kg)     Lab Results  Component Value Date   WBC 7.1 10/17/2014   HGB 13.4 10/17/2014   HCT 40.2 10/17/2014   PLT 222.0 10/17/2014   GLUCOSE 98 01/18/2015   CHOL 160 04/04/2015   TRIG 220* 04/04/2015   HDL 38* 04/04/2015   LDLCALC 78 04/04/2015   ALT 17 10/17/2014   AST 19 10/17/2014   NA 143 01/18/2015   K 4.8 01/18/2015   CL 105 01/18/2015   CREATININE 0.96 01/18/2015   BUN 19 01/18/2015   CO2 27  01/18/2015   TSH 1.93 10/17/2014   INR 1.0 10/16/2008   HGBA1C 5.5 01/18/2015    No results found.   Assessment & Plan:  Plan I have discontinued Ms. Mcconahy's levocetirizine, NONFORMULARY OR COMPOUNDED ITEM, olmesartan, furosemide, DIOVAN, and olmesartan. I am also having her start on olmesartan-hydrochlorothiazide. Additionally, I am having her maintain her [employer] LEG CRAMPS PAIN RELIEF, Cholecalciferol, Vitamin D (Ergocalciferol), aspirin, Calcium-Magnesium-Vitamin D, Acetaminophen (TYLENOL 8 HOUR PO), clonazePAM, and sertraline.  Meds ordered this encounter  Medications  . DISCONTD: olmesartan (BENICAR) 20 MG tablet    Sig: Take 1 tablet (20 mg total) by mouth daily.    Dispense:  30 tablet    Refill:  2  . olmesartan-hydrochlorothiazide (BENICAR HCT) 40-25 MG tablet    Sig: Take 1 tablet by mouth daily.    Dispense:  30 tablet    Refill:  2    Problem List Items Addressed This Visit      Unprioritized   Essential hypertension - Primary    Elevated many times at home and work  Inc to [employer] 40 / 25 and d/c lasix at pt request rto 3 months or sooner prn       Relevant Medications   olmesartan-hydrochlorothiazide (BENICAR HCT) 40-25 MG tablet      Follow-up: Return in about 6 months (around 02/13/2016), or if symptoms worsen or fail to improve, for hypertension, annual exam, fasting.  Ann Held, DO

## 2015-08-14 NOTE — Assessment & Plan Note (Signed)
Elevated many times at home and work  Inc to [employer] 40 / 25 and d/c lasix at pt request rto 3 months or sooner prn

## 2015-08-14 NOTE — Patient Instructions (Addendum)
Hypertension Hypertension, commonly called high blood pressure, is when the force of blood pumping through your arteries is too strong. Your arteries are the blood vessels that carry blood from your heart throughout your body. A blood pressure reading consists of a higher number over a lower number, such as 110/72. The higher number (systolic) is the pressure inside your arteries when your heart pumps. The lower number (diastolic) is the pressure inside your arteries when your heart relaxes. Ideally you want your blood pressure below 120/80. Hypertension forces your heart to work harder to pump blood. Your arteries may become narrow or stiff. Having untreated or uncontrolled hypertension can cause heart attack, stroke, kidney disease, and other problems. RISK FACTORS Some risk factors for high blood pressure are controllable. Others are not.  Risk factors you cannot control include:   Race. You may be at higher risk if you are African American.  Age. Risk increases with age.  Gender. Men are at higher risk than women before age 45 years. After age 65, women are at higher risk than men. Risk factors you can control include:  Not getting enough exercise or physical activity.  Being overweight.  Getting too much fat, sugar, calories, or salt in your diet.  Drinking too much alcohol. SIGNS AND SYMPTOMS Hypertension does not usually cause signs or symptoms. Extremely high blood pressure (hypertensive crisis) may cause headache, anxiety, shortness of breath, and nosebleed. DIAGNOSIS To check if you have hypertension, your health care provider will measure your blood pressure while you are seated, with your arm held at the level of your heart. It should be measured at least twice using the same arm. Certain conditions can cause a difference in blood pressure between your right and left arms. A blood pressure reading that is higher than normal on one occasion does not mean that you need treatment. If  it is not clear whether you have high blood pressure, you may be asked to return on a different day to have your blood pressure checked again. Or, you may be asked to monitor your blood pressure at home for 1 or more weeks. TREATMENT Treating high blood pressure includes making lifestyle changes and possibly taking medicine. Living a healthy lifestyle can help lower high blood pressure. You may need to change some of your habits. Lifestyle changes may include:  Following the DASH diet. This diet is high in fruits, vegetables, and whole grains. It is low in salt, red meat, and added sugars.  Keep your sodium intake below 2,300 mg per day.  Getting at least 30-45 minutes of aerobic exercise at least 4 times per week.  Losing weight if necessary.  Not smoking.  Limiting alcoholic beverages.  Learning ways to reduce stress. Your health care provider may prescribe medicine if lifestyle changes are not enough to get your blood pressure under control, and if one of the following is true:  You are 18-59 years of age and your systolic blood pressure is above 140.  You are 60 years of age or older, and your systolic blood pressure is above 150.  Your diastolic blood pressure is above 90.  You have diabetes, and your systolic blood pressure is over 140 or your diastolic blood pressure is over 90.  You have kidney disease and your blood pressure is above 140/90.  You have heart disease and your blood pressure is above 140/90. Your personal target blood pressure may vary depending on your medical conditions, your age, and other factors. HOME CARE INSTRUCTIONS    Have your blood pressure rechecked as directed by your health care provider.   Take medicines only as directed by your health care provider. Follow the directions carefully. Blood pressure medicines must be taken as prescribed. The medicine does not work as well when you skip doses. Skipping doses also puts you at risk for  problems.  Do not smoke.   Monitor your blood pressure at home as directed by your health care provider. SEEK MEDICAL CARE IF:   You think you are having a reaction to medicines taken.  You have recurrent headaches or feel dizzy.  You have swelling in your ankles.  You have trouble with your vision. SEEK IMMEDIATE MEDICAL CARE IF:  You develop a severe headache or confusion.  You have unusual weakness, numbness, or feel faint.  You have severe chest or abdominal pain.  You vomit repeatedly.  You have trouble breathing. MAKE SURE YOU:   Understand these instructions.  Will watch your condition.  Will get help right away if you are not doing well or get worse.   This information is not intended to replace advice given to you by your health care provider. Make sure you discuss any questions you have with your health care provider.   Document Released: 04/19/2005 Document Revised: 09/03/2014 Document Reviewed: 02/09/2013 Elsevier Interactive Patient Education 2016 Elsevier Inc.  

## 2015-08-14 NOTE — Progress Notes (Signed)
Pre visit review using our clinic review tool, if applicable. No additional management support is needed unless otherwise documented below in the visit note. 

## 2015-08-18 NOTE — Assessment & Plan Note (Signed)
known DJD.  Discussed tylenol, nsaids, glucosamine, topical medications.  Intraarticular injection given today.  Shown home exercises to do daily.  Continue with arch supports.  After informed written consent, patient was seated on exam table. Right knee was prepped with alcohol swab and utilizing anteromedial approach, patient's right knee was injected intraarticularly with 3:1 marcaine: depomedrol. Patient tolerated the procedure well without immediate complications.

## 2015-08-18 NOTE — Progress Notes (Signed)
PCP and consultation requested by: Ann Held, DO  Subjective:   HPI: Patient is a 65 y.o. female here for left foot/ankle pain.  12/5: Patient does not recall an injury. She reports having almost 6 months of lateral anterior left ankle pain. Pain level 4/10, constant and sharp. Worse when on feet a lot. No swelling or bruising. No skin changes, fever, other complaints.  06/03/15: Patient reports pain worsened since last visit. Pain level 8/10, sharp lateral ankle and anteriorly. Swelling comes and goes. Worse by end of day. No skin cahnges, fever, other complaints.  4/12: Patient reports she did well with injection for left ankle but pain started coming back past 1-2 weeks. Pain is 2/10, sharp anterior left ankle. Using arch supports. Some slight swelling. Worse with ambulation. 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 No locking, giving out. No skin changes, numbness.  Past Medical History  Diagnosis Date  . Hyperlipidemia   . Hypertension   . Low back pain   . Vitamin D deficiency   . Hx of cancer of lung 6/10    Removed June 2010  . Aortic stenosis, mild 01/08/2015  . Morbid obesity ([place]) 01/08/2015    Current Outpatient Prescriptions on File Prior to Visit  Medication Sig Dispense Refill  . Acetaminophen (TYLENOL 8 HOUR PO) Take by mouth daily. TAKE TWO TABLETS WITH EACH MEAL.    Marland Kitchen aspirin 81 MG tablet Take 81 mg by mouth 3 (three) times daily.     . Calcium-Magnesium-Vitamin D 300-20-200 MG-MG-UNIT CHEW Chew by mouth.    . Cholecalciferol 4000 UNITS CAPS Take 4,000 capsules by mouth daily.    . clonazePAM (KLONOPIN) 0.5 MG tablet Take 1 tablet (0.5 mg total) by mouth 2 (two) times daily as needed for anxiety. 60 tablet 1  . Homeopathic Products ([employer] LEG CRAMPS PAIN RELIEF) TABS Take by mouth as needed.     . sertraline (ZOLOFT) 100 MG tablet TAKE 1 TABLET (100 MG TOTAL) BY MOUTH DAILY. 30 tablet 5  . Vitamin D,  Ergocalciferol, (DRISDOL) 50000 UNITS CAPS capsule Take 1 capsule (50,000 Units total) by mouth every 7 (seven) days. 12 capsule 5   No current facility-administered medications on file prior to visit.    Past Surgical History  Procedure Laterality Date  . Cholecystectomy    . Tubal ligation    . Lung removal, partial      Allergies  Allergen Reactions  . Penicillins Other (See Comments)    Fever     Social History   Social History  . Marital Status: Divorced    Spouse Name: N/A  . Number of Children: 1  . Years of Education: N/A   Occupational History  . School Nutritionist    Social History Main Topics  . Smoking status: Former Smoker    Types: Cigarettes    Quit date: 02/01/2005  . Smokeless tobacco: Never Used  . Alcohol Use: No  . Drug Use: No  . Sexual Activity: Yes   Other Topics Concern  . Not on file   Social History Narrative   Exercise--  no    Family History  Problem Relation Age of Onset  . Hypertension Mother   . Stroke Mother   . Cancer Mother     lung  . Heart disease Father     cabg  . Cancer Brother     skin    BP 136/73 mmHg  Pulse 75  Ht 5'  4" (1.626 m)  Wt 270 lb (122.471 kg)  BMI 46.32 kg/m2  Review of Systems: See HPI above.    Objective:  Physical Exam:  Gen: NAD  Left foot/ankle: Pes planus. Mild diffuse swelling.  No other gross deformity, ecchymoses FROM with 5/5 strength all directions TTP over sinus tarsi, anterior ankle joint. Negative ant drawer and talar tilt.   Negative syndesmotic compression. Thompsons test negative. NV intact distally.  Right foot/ankle: FROM without pain.    Right knee: No gross deformity, ecchymoses, effusion. TTP mildly medial and lateral joint lines. FROM. Negative ant/post drawers. Negative valgus/varus testing. Negative lachmanns. Negative mcmurrays, apleys, patellar apprehension. NV intact distally.  Assessment & Plan:  1. Left ankle pain - consistent with sinus  tarsi syndrome, underlying ankle DJD.  Repeated injection today.  Continue arch supports, home exercises.  Heat/ice.  Consider radiographs or orthotics if not improving.  F/u in 1 month.  After informed written consent patient was seated on exam table.  Ultrasound utilized to identify lateral aspect of ankle joint.  Alcohol swab used for prep then left ankle injected with 2:1 marcaine: depomedrol.  Patient tolerated procedure well without immediate complications.  2. Right knee pain - known DJD.  Discussed tylenol, nsaids, glucosamine, topical medications.  Intraarticular injection given today.  Shown home exercises to do daily.  Continue with arch supports.  After informed written consent, patient was seated on exam table. Right knee was prepped with alcohol swab and utilizing anteromedial approach, patient's right knee was injected intraarticularly with 3:1 marcaine: depomedrol. Patient tolerated the procedure well without immediate complications.

## 2015-08-18 NOTE — Assessment & Plan Note (Signed)
consistent with sinus tarsi syndrome, underlying ankle DJD.  Repeated injection today.  Continue arch supports, home exercises.  Heat/ice.  Consider radiographs or orthotics if not improving.  F/u in 1 month.  After informed written consent patient was seated on exam table.  Ultrasound utilized to identify lateral aspect of ankle joint.  Alcohol swab used for prep then left ankle injected with 2:1 marcaine: depomedrol.  Patient tolerated procedure well without immediate complications.

## 2015-08-21 ENCOUNTER — Encounter: Payer: Self-pay | Admitting: Family Medicine

## 2015-08-21 ENCOUNTER — Ambulatory Visit (INDEPENDENT_AMBULATORY_CARE_PROVIDER_SITE_OTHER): Payer: BC Managed Care – PPO | Admitting: Family Medicine

## 2015-08-21 VITALS — BP 126/74 | HR 72 | Temp 98.1°F | Ht 64.0 in | Wt 277.4 lb

## 2015-08-21 DIAGNOSIS — I10 Essential (primary) hypertension: Secondary | ICD-10-CM

## 2015-08-21 DIAGNOSIS — R252 Cramp and spasm: Secondary | ICD-10-CM

## 2015-08-21 MED ORDER — OLMESARTAN MEDOXOMIL-HCTZ 40-12.5 MG PO TABS: 1.0000 | ORAL_TABLET | Freq: Every day | ORAL | Status: DC

## 2015-08-21 NOTE — Progress Notes (Signed)
Patient ID: Elizabeth [name], female    DOB: 1951-04-13  Age: 65 y.o. MRN: [id number]    Subjective:  Subjective HPI Elizabeth [name] presents for f/u bp and c/o leg cramps since increasing the benicar   No other complaints.   Review of Systems  Constitutional: Negative for diaphoresis, appetite change, fatigue and unexpected weight change.  Eyes: Negative for pain, redness and visual disturbance.  Respiratory: Negative for cough, chest tightness, shortness of breath and wheezing.   Cardiovascular: Negative for chest pain, palpitations and leg swelling.  Endocrine: Negative for cold intolerance, heat intolerance, polydipsia, polyphagia and polyuria.  Genitourinary: Negative for dysuria, frequency and difficulty urinating.  Neurological: Negative for dizziness, light-headedness, numbness and headaches.    History Past Medical History  Diagnosis Date  . Hyperlipidemia   . Hypertension   . Low back pain   . Vitamin D deficiency   . Hx of cancer of lung 6/10    Removed June 2010  . Aortic stenosis, mild 01/08/2015  . Morbid obesity ([place]) 01/08/2015    She has past surgical history that includes Cholecystectomy; Tubal ligation; and Lung removal, partial.   Her family history includes Cancer in her brother and mother; Heart disease in her father; Hypertension in her mother; Stroke in her mother.She reports that she quit smoking about 10 years ago. Her smoking use included Cigarettes. She has never used smokeless tobacco. She reports that she does not drink alcohol or use illicit drugs.  Current Outpatient Prescriptions on File Prior to Visit  Medication Sig Dispense Refill  . Acetaminophen (TYLENOL 8 HOUR PO) Take by mouth daily. TAKE THREE TABLETS WITH BID EACH MEAL.    Marland Kitchen aspirin 81 MG tablet Take 81 mg by mouth 3 (three) times daily.     . Calcium-Magnesium-Vitamin D 300-20-200 MG-MG-UNIT CHEW Chew by mouth.    . Cholecalciferol 4000 UNITS CAPS Take 4,000 capsules by mouth daily.    .  clonazePAM (KLONOPIN) 0.5 MG tablet Take 1 tablet (0.5 mg total) by mouth 2 (two) times daily as needed for anxiety. 60 tablet 1  . sertraline (ZOLOFT) 100 MG tablet TAKE 1 TABLET (100 MG TOTAL) BY MOUTH DAILY. 30 tablet 5  . Vitamin D, Ergocalciferol, (DRISDOL) 50000 UNITS CAPS capsule Take 1 capsule (50,000 Units total) by mouth every 7 (seven) days. 12 capsule 5  . Homeopathic Products ([employer] LEG CRAMPS PAIN RELIEF) TABS Take by mouth as needed. [place] [employer]     No current facility-administered medications on file prior to visit.     Objective:  Objective Physical Exam  Constitutional: She is oriented to person, place, and time. She appears well-developed and well-nourished.  HENT:  Head: Normocephalic and atraumatic.  Eyes: Conjunctivae and EOM are normal.  Neck: Normal range of motion. Neck supple. No JVD present. Carotid bruit is not present. No thyromegaly present.  Cardiovascular: Normal rate and regular rhythm.   Murmur heard. Pulmonary/Chest: Effort normal and breath sounds normal. No respiratory distress. She has no wheezes. She has no rales. She exhibits no tenderness.  Musculoskeletal: She exhibits no edema.  Neurological: She is alert and oriented to person, place, and time.  Psychiatric: She has a normal mood and affect.  Nursing note and vitals reviewed.  BP 126/74 mmHg  Pulse 72  Temp(Src) 98.1 F (36.7 C) (Oral)  Ht '5\' 4"'$  (1.626 m)  Wt 277 lb 6 oz (125.816 kg)  BMI 47.59 kg/m2  SpO2 95% Wt Readings from Last 3 Encounters:  08/21/15 277 lb  6 oz (125.816 kg)  08/14/15 276 lb 6.4 oz (125.374 kg)  08/13/15 270 lb (122.471 kg)     Lab Results  Component Value Date   WBC 7.1 10/17/2014   HGB 13.4 10/17/2014   HCT 40.2 10/17/2014   PLT 222.0 10/17/2014   GLUCOSE 70 08/21/2015   CHOL 160 04/04/2015   TRIG 220* 04/04/2015   HDL 38* 04/04/2015   LDLCALC 78 04/04/2015   ALT 17 10/17/2014   AST 19 10/17/2014   NA 140 08/21/2015   K 4.7 08/21/2015   CL 101  08/21/2015   CREATININE 1.01 08/21/2015   BUN 24* 08/21/2015   CO2 30 08/21/2015   TSH 1.93 10/17/2014   INR 1.0 10/16/2008   HGBA1C 5.5 01/18/2015    No results found.   Assessment & Plan:  Plan I have discontinued Ms. Dade's olmesartan-hydrochlorothiazide. I am also having her start on olmesartan-hydrochlorothiazide. Additionally, I am having her maintain her [employer] LEG CRAMPS PAIN RELIEF, Cholecalciferol, Vitamin D (Ergocalciferol), aspirin, Calcium-Magnesium-Vitamin D, Acetaminophen (TYLENOL 8 HOUR PO), clonazePAM, and sertraline.  Meds ordered this encounter  Medications  . olmesartan-hydrochlorothiazide (BENICAR HCT) 40-12.5 MG tablet    Sig: Take 1 tablet by mouth daily.    Dispense:  30 tablet    Refill:  2    Problem List Items Addressed This Visit      Unprioritized   Essential hypertension - Primary   Relevant Medications   olmesartan-hydrochlorothiazide (BENICAR HCT) 40-12.5 MG tablet   Other Relevant Orders   Basic metabolic panel (Completed)   Leg cramps    Using hyland for leg cramps Check labs         Follow-up: Return in about 3 months (around 11/20/2015), or if symptoms worsen or fail to improve, for annual exam, fasting.  Ann Held, DO

## 2015-08-21 NOTE — Progress Notes (Signed)
Pre visit review using our clinic review tool, if applicable. No additional management support is needed unless otherwise documented below in the visit note. 

## 2015-08-21 NOTE — Patient Instructions (Signed)
Hypertension Hypertension, commonly called high blood pressure, is when the force of blood pumping through your arteries is too strong. Your arteries are the blood vessels that carry blood from your heart throughout your body. A blood pressure reading consists of a higher number over a lower number, such as 110/72. The higher number (systolic) is the pressure inside your arteries when your heart pumps. The lower number (diastolic) is the pressure inside your arteries when your heart relaxes. Ideally you want your blood pressure below 120/80. Hypertension forces your heart to work harder to pump blood. Your arteries may become narrow or stiff. Having untreated or uncontrolled hypertension can cause heart attack, stroke, kidney disease, and other problems. RISK FACTORS Some risk factors for high blood pressure are controllable. Others are not.  Risk factors you cannot control include:   Race. You may be at higher risk if you are African American.  Age. Risk increases with age.  Gender. Men are at higher risk than women before age 45 years. After age 65, women are at higher risk than men. Risk factors you can control include:  Not getting enough exercise or physical activity.  Being overweight.  Getting too much fat, sugar, calories, or salt in your diet.  Drinking too much alcohol. SIGNS AND SYMPTOMS Hypertension does not usually cause signs or symptoms. Extremely high blood pressure (hypertensive crisis) may cause headache, anxiety, shortness of breath, and nosebleed. DIAGNOSIS To check if you have hypertension, your health care provider will measure your blood pressure while you are seated, with your arm held at the level of your heart. It should be measured at least twice using the same arm. Certain conditions can cause a difference in blood pressure between your right and left arms. A blood pressure reading that is higher than normal on one occasion does not mean that you need treatment. If  it is not clear whether you have high blood pressure, you may be asked to return on a different day to have your blood pressure checked again. Or, you may be asked to monitor your blood pressure at home for 1 or more weeks. TREATMENT Treating high blood pressure includes making lifestyle changes and possibly taking medicine. Living a healthy lifestyle can help lower high blood pressure. You may need to change some of your habits. Lifestyle changes may include:  Following the DASH diet. This diet is high in fruits, vegetables, and whole grains. It is low in salt, red meat, and added sugars.  Keep your sodium intake below 2,300 mg per day.  Getting at least 30-45 minutes of aerobic exercise at least 4 times per week.  Losing weight if necessary.  Not smoking.  Limiting alcoholic beverages.  Learning ways to reduce stress. Your health care provider may prescribe medicine if lifestyle changes are not enough to get your blood pressure under control, and if one of the following is true:  You are 18-59 years of age and your systolic blood pressure is above 140.  You are 60 years of age or older, and your systolic blood pressure is above 150.  Your diastolic blood pressure is above 90.  You have diabetes, and your systolic blood pressure is over 140 or your diastolic blood pressure is over 90.  You have kidney disease and your blood pressure is above 140/90.  You have heart disease and your blood pressure is above 140/90. Your personal target blood pressure may vary depending on your medical conditions, your age, and other factors. HOME CARE INSTRUCTIONS    Have your blood pressure rechecked as directed by your health care provider.   Take medicines only as directed by your health care provider. Follow the directions carefully. Blood pressure medicines must be taken as prescribed. The medicine does not work as well when you skip doses. Skipping doses also puts you at risk for  problems.  Do not smoke.   Monitor your blood pressure at home as directed by your health care provider. SEEK MEDICAL CARE IF:   You think you are having a reaction to medicines taken.  You have recurrent headaches or feel dizzy.  You have swelling in your ankles.  You have trouble with your vision. SEEK IMMEDIATE MEDICAL CARE IF:  You develop a severe headache or confusion.  You have unusual weakness, numbness, or feel faint.  You have severe chest or abdominal pain.  You vomit repeatedly.  You have trouble breathing. MAKE SURE YOU:   Understand these instructions.  Will watch your condition.  Will get help right away if you are not doing well or get worse.   This information is not intended to replace advice given to you by your health care provider. Make sure you discuss any questions you have with your health care provider.   Document Released: 04/19/2005 Document Revised: 09/03/2014 Document Reviewed: 02/09/2013 Elsevier Interactive Patient Education 2016 Elsevier Inc.  

## 2015-08-21 NOTE — Assessment & Plan Note (Signed)
Using hyland for leg cramps Check labs

## 2015-08-22 LAB — BASIC METABOLIC PANEL
BUN: 24 mg/dL — ABNORMAL HIGH (ref 6–23)
CO2: 30 mEq/L (ref 19–32)
Calcium: 9.5 mg/dL (ref 8.4–10.5)
Chloride: 101 mEq/L (ref 96–112)
Creatinine, Ser: 1.01 mg/dL (ref 0.40–1.20)
GFR: 58.31 mL/min — AB (ref >60.00)
Glucose, Bld: 70 mg/dL (ref 70–99)
POTASSIUM: 4.7 meq/L (ref 3.5–5.1)
SODIUM: 140 meq/L (ref 135–145)

## 2015-09-02 ENCOUNTER — Ambulatory Visit: Payer: Self-pay | Admitting: Family Medicine

## 2015-09-17 ENCOUNTER — Other Ambulatory Visit: Payer: Self-pay | Admitting: Family Medicine

## 2015-09-18 NOTE — Telephone Encounter (Signed)
Last OV: 08/21/15 Last filled: 04/24/15, #60, 1 RF Sig: Take 1 tablet (0.5 mg total) by mouth 2 (two) times daily as needed for anxiety. UDS: Not on file

## 2015-09-18 NOTE — Telephone Encounter (Signed)
Rx faxed to pharmacy  

## 2015-10-15 ENCOUNTER — Encounter: Payer: Self-pay | Admitting: Family Medicine

## 2015-10-15 ENCOUNTER — Ambulatory Visit (INDEPENDENT_AMBULATORY_CARE_PROVIDER_SITE_OTHER): Payer: BC Managed Care – PPO | Admitting: Family Medicine

## 2015-10-15 VITALS — BP 117/70 | HR 71 | Ht 64.0 in | Wt 270.0 lb

## 2015-10-15 DIAGNOSIS — M25572 Pain in left ankle and joints of left foot: Secondary | ICD-10-CM | POA: Diagnosis not present

## 2015-10-15 DIAGNOSIS — M25561 Pain in right knee: Secondary | ICD-10-CM | POA: Diagnosis not present

## 2015-10-15 NOTE — Patient Instructions (Signed)
Follow up on or after July 12th if you want to repeat injections. Continue biofreeze. Consider trying the theraband exercises for your ankle 3 sets of 10 once a day. You've tried the brace. Shoes or sandals with really good arch support are important. Physical therapy is a consideration though as we discussed cost is a barrier here. Gel shots of the knee are another consideration.

## 2015-10-16 ENCOUNTER — Ambulatory Visit (HOSPITAL_COMMUNITY)
Admission: RE | Admit: 2015-10-16 | Discharge: 2015-10-16 | Disposition: A | Discharge status: Home / Self Care | Payer: BC Managed Care – PPO | Source: Ambulatory Visit | Attending: Internal Medicine | Admitting: Internal Medicine

## 2015-10-16 ENCOUNTER — Other Ambulatory Visit (HOSPITAL_BASED_OUTPATIENT_CLINIC_OR_DEPARTMENT_OTHER): Payer: BC Managed Care – PPO

## 2015-10-16 ENCOUNTER — Encounter (HOSPITAL_COMMUNITY): Payer: Self-pay

## 2015-10-16 DIAGNOSIS — I358 Other nonrheumatic aortic valve disorders: Secondary | ICD-10-CM | POA: Diagnosis not present

## 2015-10-16 DIAGNOSIS — Z85118 Personal history of other malignant neoplasm of bronchus and lung: Secondary | ICD-10-CM

## 2015-10-16 DIAGNOSIS — C3411 Malignant neoplasm of upper lobe, right bronchus or lung: Secondary | ICD-10-CM | POA: Diagnosis not present

## 2015-10-16 DIAGNOSIS — K760 Fatty (change of) liver, not elsewhere classified: Secondary | ICD-10-CM | POA: Diagnosis not present

## 2015-10-16 DIAGNOSIS — Z9889 Other specified postprocedural states: Secondary | ICD-10-CM | POA: Diagnosis not present

## 2015-10-16 DIAGNOSIS — I2510 Atherosclerotic heart disease of native coronary artery without angina pectoris: Secondary | ICD-10-CM | POA: Insufficient documentation

## 2015-10-16 LAB — COMPREHENSIVE METABOLIC PANEL
ALT: 16 U/L (ref 0–55)
AST: 18 U/L (ref 5–34)
Albumin: 4 g/dL (ref 3.5–5.0)
Alkaline Phosphatase: 66 U/L (ref 40–150)
Anion Gap: 5 mEq/L (ref 3–11)
BUN: 19.8 mg/dL (ref 7.0–26.0)
CHLORIDE: 104 meq/L (ref 98–109)
CO2: 33 meq/L — AB (ref 22–29)
CREATININE: 0.9 mg/dL (ref 0.6–1.1)
Calcium: 9.9 mg/dL (ref 8.4–10.4)
EGFR: 67 mL/min/{1.73_m2} — ABNORMAL LOW (ref >90)
GLUCOSE: 98 mg/dL (ref 70–140)
POTASSIUM: 5 meq/L (ref 3.5–5.1)
SODIUM: 142 meq/L (ref 136–145)
Total Bilirubin: 0.41 mg/dL (ref 0.20–1.20)
Total Protein: 8.1 g/dL (ref 6.4–8.3)

## 2015-10-16 LAB — CBC WITH DIFFERENTIAL/PLATELET
BASO%: 0.3 % (ref 0.0–2.0)
BASOS ABS: 0 10*3/uL (ref 0.0–0.1)
EOS%: 2.3 % (ref 0.0–7.0)
Eosinophils Absolute: 0.2 10*3/uL (ref 0.0–0.5)
HEMATOCRIT: 39.1 % (ref 34.8–46.6)
HEMOGLOBIN: 12.9 g/dL (ref 11.6–15.9)
LYMPH#: 2.2 10*3/uL (ref 0.9–3.3)
LYMPH%: 30.8 % (ref 14.0–49.7)
MCH: 30.7 pg (ref 25.1–34.0)
MCHC: 33 g/dL (ref 31.5–36.0)
MCV: 93.1 fL (ref 79.5–101.0)
MONO#: 0.6 10*3/uL (ref 0.1–0.9)
MONO%: 8.1 % (ref 0.0–14.0)
NEUT#: 4.2 10*3/uL (ref 1.5–6.5)
NEUT%: 58.5 % (ref 38.4–76.8)
Platelets: 187 10*3/uL (ref 145–400)
RBC: 4.2 10*6/uL (ref 3.70–5.45)
RDW: 13.1 % (ref 11.2–14.5)
WBC: 7.2 10*3/uL (ref 3.9–10.3)

## 2015-10-16 MED ORDER — IOPAMIDOL (ISOVUE-300) INJECTION 61%
75.0000 mL | Freq: Once | INTRAVENOUS | Status: AC | PRN
  Administered 2015-10-16: 75 mL via INTRAVENOUS

## 2015-10-16 NOTE — Assessment & Plan Note (Signed)
consistent with sinus tarsi syndrome, underlying ankle DJD.  Discussed it's too soon to repeat injection.  Continue arch supports, home exercises.  Heat/ice.  Has tried ASO.  Can repeat injection in a month if she would like.  Consider radiographs or orthotics if not improving.  F/u prn otherwise.

## 2015-10-16 NOTE — Progress Notes (Signed)
PCP and consultation requested by: Ann Held, DO  Subjective:   HPI: Patient is a 65 y.o. female here for left foot/ankle pain.  12/5: Patient does not recall an injury. She reports having almost 6 months of lateral anterior left ankle pain. Pain level 4/10, constant and sharp. Worse when on feet a lot. No swelling or bruising. No skin changes, fever, other complaints.  06/03/15: Patient reports pain worsened since last visit. Pain level 8/10, sharp lateral ankle and anteriorly. Swelling comes and goes. Worse by end of day. No skin cahnges, fever, other complaints.  4/12: Patient reports she did well with injection for left ankle but pain started coming back past 1-2 weeks. Pain is 2/10, sharp anterior left ankle. Using arch supports. Some slight swelling. Worse with ambulation. 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 No locking, giving out. No skin changes, numbness.  6/14: Patient reports over past few weeks pain has returned in left ankle and right knee. Pain level 3/10 in knee, 5/10 in ankle. Pain in knee is anteromedial, worse with walking and can be sharp. Pain in ankle is still lateral, sharp. Also worse with ambulation. No catching, locking of knee or ankle. Will feel like left heel is going to give out. No skin changes, numbness.  Past Medical History  Diagnosis Date  . Hyperlipidemia   . Hypertension   . Low back pain   . Vitamin D deficiency   . Hx of cancer of lung 6/10    Removed June 2010  . Aortic stenosis, mild 01/08/2015  . Morbid obesity ([place]) 01/08/2015  . Cancer [facility]) lung     lung    Current Outpatient Prescriptions on File Prior to Visit  Medication Sig Dispense Refill  . Acetaminophen (TYLENOL 8 HOUR PO) Take by mouth daily. TAKE THREE TABLETS WITH BID EACH MEAL.    Marland Kitchen aspirin 81 MG tablet Take 81 mg by mouth 3 (three) times daily.     . Calcium-Magnesium-Vitamin D 300-20-200 MG-MG-UNIT CHEW Chew  by mouth.    . Cholecalciferol 4000 UNITS CAPS Take 4,000 capsules by mouth daily.    . clonazePAM (KLONOPIN) 0.5 MG tablet TAKE 1 TABLET BY MOUTH TWICE A DAY AS NEEDED FOR ANXIETY 60 tablet 1  . Homeopathic Products ([employer] LEG CRAMPS PAIN RELIEF) TABS Take by mouth as needed. [employer]    . olmesartan-hydrochlorothiazide (BENICAR HCT) 40-12.5 MG tablet Take 1 tablet by mouth daily. 30 tablet 2  . sertraline (ZOLOFT) 100 MG tablet TAKE 1 TABLET (100 MG TOTAL) BY MOUTH DAILY. 30 tablet 5  . Vitamin D, Ergocalciferol, (DRISDOL) 50000 UNITS CAPS capsule Take 1 capsule (50,000 Units total) by mouth every 7 (seven) days. 12 capsule 5   No current facility-administered medications on file prior to visit.    Past Surgical History  Procedure Laterality Date  . Cholecystectomy    . Tubal ligation    . Lung removal, partial      Allergies  Allergen Reactions  . Penicillins Other (See Comments)    Fever     Social History   Social History  . Marital Status: Divorced    Spouse Name: N/A  . Number of Children: 1  . Years of Education: N/A   Occupational History  . School Nutritionist    Social History Main Topics  . Smoking status: Former Smoker    Types: Cigarettes    Quit date: 02/01/2005  . Smokeless tobacco: Never Used  .  Alcohol Use: No  . Drug Use: No  . Sexual Activity: Yes   Other Topics Concern  . Not on file   Social History Narrative   Exercise--  no    Family History  Problem Relation Age of Onset  . Hypertension Mother   . Stroke Mother   . Cancer Mother     lung  . Heart disease Father     cabg  . Cancer Brother     skin    BP 117/70 mmHg  Pulse 71  Ht '5\' 4"'$  (1.626 m)  Wt 270 lb (122.471 kg)  BMI 46.32 kg/m2  Review of Systems: See HPI above.    Objective:  Physical Exam:  Gen: NAD  Left foot/ankle: Pes planus. Mild diffuse swelling.  No other gross deformity, ecchymoses FROM with 5/5 strength all directions TTP over sinus  tarsi. Negative ant drawer and talar tilt.   Negative syndesmotic compression. Thompsons test negative. NV intact distally.  Right foot/ankle: FROM without pain.    Right knee: No gross deformity, ecchymoses, effusion. TTP mildly medial and lateral joint lines. FROM. Negative ant/post drawers. Negative valgus/varus testing. Negative lachmanns. Negative mcmurrays, apleys, patellar apprehension. NV intact distally.  Assessment & Plan:  1. Left ankle pain - consistent with sinus tarsi syndrome, underlying ankle DJD.  Discussed it's too soon to repeat injection.  Continue arch supports, home exercises.  Heat/ice.  Has tried ASO.  Can repeat injection in a month if she would like.  Consider radiographs or orthotics if not improving.  F/u prn otherwise.  2. Right knee pain - known DJD.  Discussed tylenol, nsaids, glucosamine, topical medications.  Too early to repeat injection- can consider in a month.  Discussed viscosupplementation also.  F/u prn.

## 2015-10-16 NOTE — Assessment & Plan Note (Signed)
known DJD.  Discussed tylenol, nsaids, glucosamine, topical medications.  Too early to repeat injection- can consider in a month.  Discussed viscosupplementation also.  F/u prn.

## 2015-10-21 ENCOUNTER — Ambulatory Visit (INDEPENDENT_AMBULATORY_CARE_PROVIDER_SITE_OTHER): Payer: BC Managed Care – PPO | Admitting: Family Medicine

## 2015-10-21 ENCOUNTER — Encounter: Payer: Self-pay | Admitting: Family Medicine

## 2015-10-21 VITALS — BP 130/66 | HR 87 | Temp 98.7°F | Wt 281.6 lb

## 2015-10-21 DIAGNOSIS — F419 Anxiety disorder, unspecified: Secondary | ICD-10-CM

## 2015-10-21 DIAGNOSIS — R600 Localized edema: Secondary | ICD-10-CM | POA: Diagnosis not present

## 2015-10-21 DIAGNOSIS — I10 Essential (primary) hypertension: Secondary | ICD-10-CM

## 2015-10-21 DIAGNOSIS — E559 Vitamin D deficiency, unspecified: Secondary | ICD-10-CM | POA: Diagnosis not present

## 2015-10-21 MED ORDER — OLMESARTAN MEDOXOMIL-HCTZ 40-12.5 MG PO TABS: 1.0000 | ORAL_TABLET | Freq: Every day | ORAL | Status: DC

## 2015-10-21 MED ORDER — VITAMIN D (ERGOCALCIFEROL) 1.25 MG (50000 UNIT) PO CAPS: 50000.0000 [IU] | ORAL_CAPSULE | ORAL | Status: DC

## 2015-10-21 MED ORDER — FUROSEMIDE 20 MG PO TABS: 20.0000 mg | ORAL_TABLET | Freq: Every day | ORAL | Status: DC

## 2015-10-21 MED ORDER — SERTRALINE HCL 100 MG PO TABS: ORAL_TABLET | ORAL | Status: DC

## 2015-10-21 NOTE — Assessment & Plan Note (Signed)
Add lasix prn  [employer] fluids

## 2015-10-21 NOTE — Patient Instructions (Signed)
Edema °Edema is an abnormal buildup of fluids in your body tissues. Edema is somewhat dependent on gravity to pull the fluid to the lowest place in your body. That makes the condition more common in the legs and thighs (lower extremities). Painless swelling of the feet and ankles is common and becomes more likely as you get older. It is also common in looser tissues, like around your eyes.  °When the affected area is squeezed, the fluid may move out of that spot and leave a dent for a few moments. This dent is called pitting.  °CAUSES  °There are many possible causes of edema. Eating too much salt and being on your feet or sitting for a long time can cause edema in your legs and ankles. Hot weather may make edema worse. Common medical causes of edema include: °· Heart failure. °· Liver disease. °· Kidney disease. °· Weak blood vessels in your legs. °· Cancer. °· An injury. °· Pregnancy. °· Some medications. °· Obesity.  °SYMPTOMS  °Edema is usually painless. Your skin may look swollen or shiny.  °DIAGNOSIS  °Your health care provider may be able to diagnose edema by asking about your medical history and doing a physical exam. You may need to have tests such as X-rays, an electrocardiogram, or blood tests to check for medical conditions that may cause edema.  °TREATMENT  °Edema treatment depends on the cause. If you have heart, liver, or kidney disease, you need the treatment appropriate for these conditions. General treatment may include: °· Elevation of the affected body part above the level of your heart. °· Compression of the affected body part. Pressure from elastic bandages or support stockings squeezes the tissues and forces fluid back into the blood vessels. This keeps fluid from entering the tissues. °· Restriction of fluid and salt intake. °· Use of a water pill (diuretic). These medications are appropriate only for some types of edema. They pull fluid out of your body and make you urinate more often. This  gets rid of fluid and reduces swelling, but diuretics can have side effects. Only use diuretics as directed by your health care provider. °HOME CARE INSTRUCTIONS  °· Keep the affected body part above the level of your heart when you are lying down.   °· Do not sit still or stand for prolonged periods.   °· Do not put anything directly under your knees when lying down. °· Do not wear constricting clothing or garters on your upper legs.   °· Exercise your legs to work the fluid back into your blood vessels. This may help the swelling go down.   °· Wear elastic bandages or support stockings to reduce ankle swelling as directed by your health care provider.   °· Eat a low-salt diet to reduce fluid if your health care provider recommends it.   °· Only take medicines as directed by your health care provider.  °SEEK MEDICAL CARE IF:  °· Your edema is not responding to treatment. °· You have heart, liver, or kidney disease and notice symptoms of edema. °· You have edema in your legs that does not improve after elevating them.   °· You have sudden and unexplained weight gain. °SEEK IMMEDIATE MEDICAL CARE IF:  °· You develop shortness of breath or chest pain.   °· You cannot breathe when you lie down. °· You develop pain, redness, or warmth in the swollen areas.   °· You have heart, liver, or kidney disease and suddenly get edema. °· You have a fever and your symptoms suddenly get worse. °MAKE SURE YOU:  °·   Understand these instructions. °· Will watch your condition. °· Will get help right away if you are not doing well or get worse. °  °This information is not intended to replace advice given to you by your health care provider. Make sure you discuss any questions you have with your health care provider. °  °Document Released: 04/19/2005 Document Revised: 05/10/2014 Document Reviewed: 02/09/2013 °Elsevier Interactive Patient Education ©2016 [employer]. ° °

## 2015-10-21 NOTE — Progress Notes (Signed)
Patient ID: Elizabeth [name], female    DOB: 12-02-1950  Age: 65 y.o. MRN: [id number]    Subjective:  Subjective HPI Elizabeth [name] presents for f/u bp and c/o swelling low ext--- she is requesting prn diuretic.  No sob. No cp  Review of Systems  Constitutional: Negative for diaphoresis, appetite change, fatigue and unexpected weight change.  Eyes: Negative for pain, redness and visual disturbance.  Respiratory: Negative for cough, chest tightness, shortness of breath and wheezing.   Cardiovascular: Negative for chest pain, palpitations and leg swelling.  Endocrine: Negative for cold intolerance, heat intolerance, polydipsia, polyphagia and polyuria.  Genitourinary: Negative for dysuria, frequency and difficulty urinating.  Neurological: Negative for dizziness, light-headedness, numbness and headaches.    History Past Medical History  Diagnosis Date  . Hyperlipidemia   . Hypertension   . Low back pain   . Vitamin D deficiency   . Hx of cancer of lung 6/10    Removed June 2010  . Aortic stenosis, mild 01/08/2015  . Morbid obesity ([place]) 01/08/2015  . Cancer [facility]) lung     lung    She has past surgical history that includes Cholecystectomy; Tubal ligation; and Lung removal, partial.   Her family history includes Cancer in her brother and mother; Heart disease in her father; Hypertension in her mother; Stroke in her mother.She reports that she quit smoking about 10 years ago. Her smoking use included Cigarettes. She has never used smokeless tobacco. She reports that she does not drink alcohol or use illicit drugs.  Current Outpatient Prescriptions on File Prior to Visit  Medication Sig Dispense Refill  . Acetaminophen (TYLENOL 8 HOUR PO) Take by mouth daily. TAKE THREE TABLETS WITH BID EACH MEAL.    Marland Kitchen aspirin 81 MG tablet Take 81 mg by mouth 3 (three) times daily.     . Calcium-Magnesium-Vitamin D 300-20-200 MG-MG-UNIT CHEW Chew by mouth.    . Cholecalciferol 4000 UNITS CAPS Take 4,000  capsules by mouth daily.    . clonazePAM (KLONOPIN) 0.5 MG tablet TAKE 1 TABLET BY MOUTH TWICE A DAY AS NEEDED FOR ANXIETY 60 tablet 1  . Homeopathic Products ([employer] LEG CRAMPS PAIN RELIEF) TABS Take by mouth as needed. [employer]     No current facility-administered medications on file prior to visit.     Objective:  Objective Physical Exam  Constitutional: She is oriented to person, place, and time. She appears well-developed and well-nourished.  HENT:  Head: Normocephalic and atraumatic.  Eyes: Conjunctivae and EOM are normal.  Neck: Normal range of motion. Neck supple. No JVD present. Carotid bruit is not present. No thyromegaly present.  Cardiovascular: Normal rate, regular rhythm and normal heart sounds.   No murmur heard. Pulmonary/Chest: Effort normal and breath sounds normal. No respiratory distress. She has no wheezes. She has no rales. She exhibits no tenderness.  Musculoskeletal: She exhibits edema. She exhibits no tenderness.  Neurological: She is alert and oriented to person, place, and time.  Psychiatric: She has a normal mood and affect. Her behavior is normal. Judgment and thought content normal.  Nursing note and vitals reviewed.  BP 130/66 mmHg  Pulse 87  Temp(Src) 98.7 F (37.1 C) (Oral)  Wt 281 lb 9.6 oz (127.733 kg)  SpO2 96% Wt Readings from Last 3 Encounters:  10/21/15 281 lb 9.6 oz (127.733 kg)  10/15/15 270 lb (122.471 kg)  08/21/15 277 lb 6 oz (125.816 kg)     Lab Results  Component Value Date   WBC  7.2 10/16/2015   HGB 12.9 10/16/2015   HCT 39.1 10/16/2015   PLT 187 10/16/2015   GLUCOSE 98 10/16/2015   CHOL 160 04/04/2015   TRIG 220* 04/04/2015   HDL 38* 04/04/2015   LDLCALC 78 04/04/2015   ALT 16 10/16/2015   AST 18 10/16/2015   NA 142 10/16/2015   K 5.0 10/16/2015   CL 101 08/21/2015   CREATININE 0.9 10/16/2015   BUN 19.8 10/16/2015   CO2 33* 10/16/2015   TSH 1.93 10/17/2014   INR 1.0 10/16/2008   HGBA1C 5.5 01/18/2015    Ct  Chest W Contrast  10/16/2015  CLINICAL DATA:  65 year old female with history of right-sided lung cancer status post right lower lobe wedge resection. Restaging examination. EXAM: CT CHEST WITH CONTRAST TECHNIQUE: Multidetector CT imaging of the chest was performed during intravenous contrast administration. CONTRAST:  4m ISOVUE-300 IOPAMIDOL (ISOVUE-300) INJECTION 61% COMPARISON:  Multiple priors, most recently 08/24/2014. FINDINGS: Mediastinum/Lymph Nodes: Heart size is normal. There is no significant pericardial fluid, thickening or pericardial calcification. There is atherosclerosis of the thoracic aorta, the great vessels of the mediastinum and the coronary arteries, including calcified atherosclerotic plaque in the left main, left anterior descending and right coronary arteries. Calcifications of the aortic valve. There is prominent nodal tissue in the subcarinal region. This appears to be a conglomerate of smaller lymph nodes, but this measures 3.1 x 1.9 cm overall (image 69 of series 2), and is overall similar to prior examinations dating back to at least 10/13/2011. No other mediastinal or hilar lymphadenopathy is noted. Esophagus is normal in appearance. No axillary lymphadenopathy. Lungs/Pleura: Postoperative changes of wedge resection in the right lower lobe are again noted. Mild postoperative architectural distortion in the right lower lobe is similar to prior studies. Chronic right-sided pleural thickening again noted. Focal area of expansion of extrapleural fat in the periphery of the right hemithorax posterolaterally (image 37 of series 2), unchanged, presumably related to prior surgery. No new suspicious appearing pulmonary nodules or masses. No acute consolidative airspace disease. Upper Abdomen: Diffuse low attenuation throughout the hepatic parenchyma, compatible with hepatic steatosis. Status post cholecystectomy. Atherosclerosis. Musculoskeletal/Soft Tissues: There are no aggressive  appearing lytic or blastic lesions noted in the visualized portions of the skeleton. IMPRESSION: 1. Stable postoperative findings in the right chest related to prior right lower lobe wedge resection. No definite findings to suggest recurrent or metastatic disease in the thorax. 2. Persistent prominence of subcarinal nodal tissue dating back to 10/13/2011, likely a conglomeration of small lymph nodes. 3. Atherosclerosis, including left main and 2 vessel coronary artery disease. Please note that although the presence of coronary artery calcium documents the presence of coronary artery disease, the severity of this disease and any potential stenosis cannot be assessed on this non-gated CT examination. Assessment for potential risk factor modification, dietary therapy or pharmacologic therapy may be warranted, if clinically indicated. 4. There are calcifications of the aortic valve. Echocardiographic correlation for evaluation of potential valvular dysfunction may be warranted if clinically indicated. 5. Hepatic steatosis. Electronically Signed   By: DVinnie LangtonM.D.   On: 10/16/2015 14:05     Assessment & Plan:  Plan I am having Elizabeth [name] start on furosemide. I am also having her maintain her [employer] LEG CRAMPS PAIN RELIEF, Cholecalciferol, aspirin, Calcium-Magnesium-Vitamin D, Acetaminophen (TYLENOL 8 HOUR PO), clonazePAM, olmesartan-hydrochlorothiazide, sertraline, and Vitamin D (Ergocalciferol).  Meds ordered this encounter  Medications  . olmesartan-hydrochlorothiazide (BENICAR HCT) 40-12.5 MG tablet    Sig: Take 1  tablet by mouth daily.    Dispense:  30 tablet    Refill:  2  . sertraline (ZOLOFT) 100 MG tablet    Sig: TAKE 1 TABLET (100 MG TOTAL) BY MOUTH DAILY.    Dispense:  30 tablet    Refill:  5  . Vitamin D, Ergocalciferol, (DRISDOL) 50000 units CAPS capsule    Sig: Take 1 capsule (50,000 Units total) by mouth every 7 (seven) days.    Dispense:  12 capsule    Refill:  5  . furosemide  (LASIX) 20 MG tablet    Sig: Take 1 tablet (20 mg total) by mouth daily.    Dispense:  30 tablet    Refill:  3    Problem List Items Addressed This Visit    Essential hypertension - Primary   Relevant Medications   olmesartan-hydrochlorothiazide (BENICAR HCT) 40-12.5 MG tablet   furosemide (LASIX) 20 MG tablet   Lower extremity edema    Add lasix prn  [employer] fluids       Relevant Medications   furosemide (LASIX) 20 MG tablet    Other Visit Diagnoses    Vitamin D deficiency        Relevant Medications    Vitamin D, Ergocalciferol, (DRISDOL) 50000 units CAPS capsule    Anxiety disorder, unspecified anxiety disorder type        Relevant Medications    sertraline (ZOLOFT) 100 MG tablet       Follow-up: Return in about 6 months (around 04/21/2016), or if symptoms worsen or fail to improve, for hypertension, hyperlipidemia.  Ann Held, DO

## 2015-10-22 ENCOUNTER — Ambulatory Visit: Payer: BC Managed Care – PPO | Admitting: Licensed Clinical Social Worker

## 2015-10-22 ENCOUNTER — Other Ambulatory Visit: Payer: Self-pay | Admitting: Family Medicine

## 2015-10-22 NOTE — Telephone Encounter (Signed)
Rx denied.  Filled on (10/21/15-#12,5).//AB/CMA

## 2015-10-23 ENCOUNTER — Ambulatory Visit (HOSPITAL_BASED_OUTPATIENT_CLINIC_OR_DEPARTMENT_OTHER): Payer: BC Managed Care – PPO | Admitting: Internal Medicine

## 2015-10-23 ENCOUNTER — Telehealth: Payer: Self-pay | Admitting: Internal Medicine

## 2015-10-23 ENCOUNTER — Encounter: Payer: Self-pay | Admitting: Internal Medicine

## 2015-10-23 VITALS — BP 130/50 | HR 80 | Temp 98.2°F | Resp 19 | Ht 64.0 in | Wt 282.7 lb

## 2015-10-23 DIAGNOSIS — Z85118 Personal history of other malignant neoplasm of bronchus and lung: Secondary | ICD-10-CM | POA: Diagnosis not present

## 2015-10-23 DIAGNOSIS — C3411 Malignant neoplasm of upper lobe, right bronchus or lung: Secondary | ICD-10-CM

## 2015-10-23 NOTE — Telephone Encounter (Signed)
Printed avs for pt sent msg to [facility]

## 2015-10-23 NOTE — Progress Notes (Signed)
[place] [phone]   [phone] OFFICE PROGRESS NOTE  Ann Held, DO [address] [place] [address]  DIAGNOSIS: Stage IA (T1a, NX, MX) non-small cell lung cancer, squamous cell carcinoma diagnosed in May 2010.   PRIOR THERAPY: Status post right upper lobe superior segmentectomy under the care of Dr. Arlyce Dice on 10/18/2008.   CURRENT THERAPY: Observation.  INTERVAL HISTORY: Elizabeth [name] 65 y.o. female returns to the clinic today for annual followup visit. The patient has been on observation for the last 7 years. The patient is feeling fine today with no specific complaints except for arthritis. She denied having any significant chest pain, shortness breath, cough or hemoptysis. She has no weight loss and night sweats. The patient denied having any fever or chills. She had repeat CT scan of the chest performed recently and she is here for evaluation and discussion of her scan results.  MEDICAL HISTORY: Past Medical History  Diagnosis Date  . Hyperlipidemia   . Hypertension   . Low back pain   . Vitamin D deficiency   . Hx of cancer of lung 6/10    Removed June 2010  . Aortic stenosis, mild 01/08/2015  . Morbid obesity ([place]) 01/08/2015  . Cancer ([place]) lung     lung    ALLERGIES:  is allergic to penicillins.  MEDICATIONS:  Current Outpatient Prescriptions  Medication Sig Dispense Refill  . Acetaminophen (TYLENOL 8 HOUR PO) Take by mouth daily. TAKE THREE TABLETS WITH BID EACH MEAL.    Marland Kitchen aspirin 81 MG tablet Take 81 mg by mouth 3 (three) times daily.     . Calcium-Magnesium-Vitamin D 300-20-200 MG-MG-UNIT CHEW Chew by mouth.    . Cholecalciferol 4000 UNITS CAPS Take 4,000 capsules by mouth daily.    . clonazePAM (KLONOPIN) 0.5 MG tablet TAKE 1 TABLET BY MOUTH TWICE A DAY AS NEEDED FOR ANXIETY 60 tablet 1  . furosemide (LASIX) 20 MG tablet Take 1 tablet (20 mg total) by mouth daily. 30 tablet 3  . Homeopathic  Products ([employer] LEG CRAMPS PAIN RELIEF) TABS Take by mouth as needed. [employer]    . olmesartan-hydrochlorothiazide (BENICAR HCT) 40-12.5 MG tablet Take 1 tablet by mouth daily. 30 tablet 2  . sertraline (ZOLOFT) 100 MG tablet TAKE 1 TABLET (100 MG TOTAL) BY MOUTH DAILY. 30 tablet 5  . Vitamin D, Ergocalciferol, (DRISDOL) 50000 units CAPS capsule Take 1 capsule (50,000 Units total) by mouth every 7 (seven) days. 12 capsule 5   No current facility-administered medications for this visit.    SURGICAL HISTORY:  Past Surgical History  Procedure Laterality Date  . Cholecystectomy    . Tubal ligation    . Lung removal, partial      REVIEW OF SYSTEMS:  A comprehensive review of systems was negative except for: Musculoskeletal: positive for arthralgias   PHYSICAL EXAMINATION: General appearance: alert, cooperative and no distress Head: Normocephalic, without obvious abnormality, atraumatic Neck: no adenopathy Lymph nodes: Cervical, supraclavicular, and axillary nodes normal. Resp: clear to auscultation bilaterally Cardio: regular rate and rhythm, S1, S2 normal, no murmur, click, rub or gallop GI: soft, non-tender; bowel sounds normal; no masses,  no organomegaly Extremities: extremities normal, atraumatic, no cyanosis or edema  ECOG PERFORMANCE STATUS: 1 - Symptomatic but completely ambulatory  Blood pressure 130/50, pulse 80, temperature 98.2 F (36.8 C), temperature source Oral, resp. rate 19, height '5\' 4"'$  (1.626 m), weight 282 lb 11.2  oz (128.232 kg), SpO2 98 %.  LABORATORY DATA: Lab Results  Component Value Date   WBC 7.2 10/16/2015   HGB 12.9 10/16/2015   HCT 39.1 10/16/2015   MCV 93.1 10/16/2015   PLT 187 10/16/2015      Chemistry      Component Value Date/Time   NA 142 10/16/2015 1125   NA 140 08/21/2015 1534   NA 144 10/13/2011 1204   K 5.0 10/16/2015 1125   K 4.7 08/21/2015 1534   K 5.1* 10/13/2011 1204   CL 101 08/21/2015 1534   CL 104 10/02/2012 0949   CL  101 10/13/2011 1204   CO2 33* 10/16/2015 1125   CO2 30 08/21/2015 1534   CO2 33 10/13/2011 1204   BUN 19.8 10/16/2015 1125   BUN 24* 08/21/2015 1534   BUN 16 10/13/2011 1204   CREATININE 0.9 10/16/2015 1125   CREATININE 1.01 08/21/2015 1534   CREATININE 0.96 01/18/2015 1033      Component Value Date/Time   CALCIUM 9.9 10/16/2015 1125   CALCIUM 9.5 08/21/2015 1534   CALCIUM 9.1 10/13/2011 1204   ALKPHOS 66 10/16/2015 1125   ALKPHOS 66 10/17/2014 1034   ALKPHOS 71 10/13/2011 1204   AST 18 10/16/2015 1125   AST 19 10/17/2014 1034   AST 31 10/13/2011 1204   ALT 16 10/16/2015 1125   ALT 17 10/17/2014 1034   ALT 31 10/13/2011 1204   BILITOT 0.41 10/16/2015 1125   BILITOT 0.5 10/17/2014 1034   BILITOT 0.60 10/13/2011 1204       RADIOGRAPHIC STUDIES: Ct Chest W Contrast  10/16/2015  CLINICAL DATA:  65 year old female with history of right-sided lung cancer status post right lower lobe wedge resection. Restaging examination. EXAM: CT CHEST WITH CONTRAST TECHNIQUE: Multidetector CT imaging of the chest was performed during intravenous contrast administration. CONTRAST:  52m ISOVUE-300 IOPAMIDOL (ISOVUE-300) INJECTION 61% COMPARISON:  Multiple priors, most recently 08/24/2014. FINDINGS: Mediastinum/Lymph Nodes: Heart size is normal. There is no significant pericardial fluid, thickening or pericardial calcification. There is atherosclerosis of the thoracic aorta, the great vessels of the mediastinum and the coronary arteries, including calcified atherosclerotic plaque in the left main, left anterior descending and right coronary arteries. Calcifications of the aortic valve. There is prominent nodal tissue in the subcarinal region. This appears to be a conglomerate of smaller lymph nodes, but this measures 3.1 x 1.9 cm overall (image 69 of series 2), and is overall similar to prior examinations dating back to at least 10/13/2011. No other mediastinal or hilar lymphadenopathy is noted. Esophagus  is normal in appearance. No axillary lymphadenopathy. Lungs/Pleura: Postoperative changes of wedge resection in the right lower lobe are again noted. Mild postoperative architectural distortion in the right lower lobe is similar to prior studies. Chronic right-sided pleural thickening again noted. Focal area of expansion of extrapleural fat in the periphery of the right hemithorax posterolaterally (image 37 of series 2), unchanged, presumably related to prior surgery. No new suspicious appearing pulmonary nodules or masses. No acute consolidative airspace disease. Upper Abdomen: Diffuse low attenuation throughout the hepatic parenchyma, compatible with hepatic steatosis. Status post cholecystectomy. Atherosclerosis. Musculoskeletal/Soft Tissues: There are no aggressive appearing lytic or blastic lesions noted in the visualized portions of the skeleton. IMPRESSION: 1. Stable postoperative findings in the right chest related to prior right lower lobe wedge resection. No definite findings to suggest recurrent or metastatic disease in the thorax. 2. Persistent prominence of subcarinal nodal tissue dating back to 10/13/2011, likely a conglomeration of small lymph  nodes. 3. Atherosclerosis, including left main and 2 vessel coronary artery disease. Please note that although the presence of coronary artery calcium documents the presence of coronary artery disease, the severity of this disease and any potential stenosis cannot be assessed on this non-gated CT examination. Assessment for potential risk factor modification, dietary therapy or pharmacologic therapy may be warranted, if clinically indicated. 4. There are calcifications of the aortic valve. Echocardiographic correlation for evaluation of potential valvular dysfunction may be warranted if clinically indicated. 5. Hepatic steatosis. Electronically Signed   By: Vinnie Langton M.D.   On: 10/16/2015 14:05   ASSESSMENT AND PLAN: This is a very pleasant 65 years old  white female with history of stage IA non-small cell lung cancer status post right upper lobe superior segmentectomy in June of 2010 and has been observation since that time with no evidence for disease recurrence.I discussed the scan results with the patient today. The patient has been doing fine for the last 7 years. I recommended for her to continue on observation and I will refer her to the survivorship clinic at the [facility] for further evaluation. She was advised to call immediately if she has any concerning symptoms in the interval. All questions were answered. The patient knows to call the clinic with any problems, questions or concerns. We can certainly see the patient much sooner if necessary.  Disclaimer: This note was dictated with voice recognition software. Similar sounding words can inadvertently be transcribed and may not be corrected upon review.

## 2015-11-20 ENCOUNTER — Ambulatory Visit: Payer: Self-pay | Admitting: Family Medicine

## 2015-12-08 ENCOUNTER — Other Ambulatory Visit: Payer: Self-pay | Admitting: Family Medicine

## 2015-12-08 ENCOUNTER — Encounter: Payer: Self-pay | Admitting: Family Medicine

## 2015-12-08 ENCOUNTER — Ambulatory Visit (INDEPENDENT_AMBULATORY_CARE_PROVIDER_SITE_OTHER): Payer: BC Managed Care – PPO | Admitting: Family Medicine

## 2015-12-08 VITALS — BP 118/60 | HR 74 | Temp 98.2°F | Resp 17 | Ht 64.0 in | Wt 277.2 lb

## 2015-12-08 DIAGNOSIS — I10 Essential (primary) hypertension: Secondary | ICD-10-CM | POA: Diagnosis not present

## 2015-12-08 DIAGNOSIS — Z23 Encounter for immunization: Secondary | ICD-10-CM | POA: Diagnosis not present

## 2015-12-08 DIAGNOSIS — Z1231 Encounter for screening mammogram for malignant neoplasm of breast: Secondary | ICD-10-CM

## 2015-12-08 DIAGNOSIS — F419 Anxiety disorder, unspecified: Secondary | ICD-10-CM

## 2015-12-08 DIAGNOSIS — Z0000 Encounter for general adult medical examination without abnormal findings: Secondary | ICD-10-CM | POA: Diagnosis not present

## 2015-12-08 LAB — COMPREHENSIVE METABOLIC PANEL
ALT: 17 U/L (ref 0–35)
AST: 19 U/L (ref 0–37)
Albumin: 4.3 g/dL (ref 3.5–5.2)
Alkaline Phosphatase: 55 U/L (ref 39–117)
BILIRUBIN TOTAL: 0.5 mg/dL (ref 0.2–1.2)
BUN: 20 mg/dL (ref 6–23)
CHLORIDE: 103 meq/L (ref 96–112)
CO2: 30 meq/L (ref 19–32)
Calcium: 9.6 mg/dL (ref 8.4–10.5)
Creatinine, Ser: 0.83 mg/dL (ref 0.40–1.20)
GFR: 73.07 mL/min (ref >60.00)
Glucose, Bld: 88 mg/dL (ref 70–99)
Potassium: 3.8 mEq/L (ref 3.5–5.1)
Sodium: 140 mEq/L (ref 135–145)
Total Protein: 7.8 g/dL (ref 6.0–8.3)

## 2015-12-08 LAB — LIPID PANEL
CHOL/HDL RATIO: 4
Cholesterol: 141 mg/dL (ref 0–200)
HDL: 36.4 mg/dL — AB (ref >39.00)
LDL CALC: 76 mg/dL (ref 0–99)
NONHDL: 104.44
Triglycerides: 141 mg/dL (ref 0.0–149.0)
VLDL: 28.2 mg/dL (ref 0.0–40.0)

## 2015-12-08 LAB — CBC WITH DIFFERENTIAL/PLATELET
BASOS ABS: 0 10*3/uL (ref 0.0–0.1)
Basophils Relative: 0.4 % (ref 0.0–3.0)
EOS ABS: 0.1 10*3/uL (ref 0.0–0.7)
Eosinophils Relative: 1.8 % (ref 0.0–5.0)
HCT: 37 % (ref 36.0–46.0)
Hemoglobin: 12.3 g/dL (ref 12.0–15.0)
LYMPHS ABS: 2.1 10*3/uL (ref 0.7–4.0)
Lymphocytes Relative: 29.7 % (ref 12.0–46.0)
MCHC: 33.3 g/dL (ref 30.0–36.0)
MCV: 91.6 fl (ref 78.0–100.0)
MONOS PCT: 7.4 % (ref 3.0–12.0)
Monocytes Absolute: 0.5 10*3/uL (ref 0.1–1.0)
NEUTROS ABS: 4.3 10*3/uL (ref 1.4–7.7)
NEUTROS PCT: 60.7 % (ref 43.0–77.0)
PLATELETS: 192 10*3/uL (ref 150.0–400.0)
RBC: 4.04 Mil/uL (ref 3.87–5.11)
RDW: 13.6 % (ref 11.5–15.5)
WBC: 7.1 10*3/uL (ref 4.0–10.5)

## 2015-12-08 LAB — POCT URINALYSIS DIPSTICK
Bilirubin, UA: NEGATIVE
Glucose, UA: NEGATIVE
Ketones, UA: NEGATIVE
LEUKOCYTES UA: NEGATIVE
NITRITE UA: NEGATIVE
PH UA: 5
Protein, UA: NEGATIVE
RBC UA: NEGATIVE
Spec Grav, UA: >=1.03
UROBILINOGEN UA: 0.2

## 2015-12-08 LAB — TSH: TSH: 2.3 u[IU]/mL (ref 0.35–4.50)

## 2015-12-08 MED ORDER — OLMESARTAN MEDOXOMIL-HCTZ 40-12.5 MG PO TABS: 1.0000 | ORAL_TABLET | Freq: Every day | ORAL | 3 refills | Status: DC

## 2015-12-08 MED ORDER — SERTRALINE HCL 100 MG PO TABS: ORAL_TABLET | ORAL | 3 refills | Status: DC

## 2015-12-08 NOTE — Progress Notes (Signed)
Patient ID: Elizabeth [name], female    DOB: 11-07-1950  Age: 65 y.o. MRN: [id number]    Subjective:  Subjective  HPI Elizabeth [name] presents for   Review of Systems  Constitutional: Negative for activity change, appetite change, fatigue and unexpected weight change.  Respiratory: Negative for cough and shortness of breath.   Cardiovascular: Negative for chest pain and palpitations.  Psychiatric/Behavioral: Negative for behavioral problems and dysphoric mood. The patient is not nervous/anxious.     History Past Medical History:  Diagnosis Date  . Aortic stenosis, mild 01/08/2015  . Cancer ([place]) lung    lung  . Hx of cancer of lung 6/10   Removed June 2010  . Hyperlipidemia   . Hypertension   . Low back pain   . Morbid obesity ([place]) 01/08/2015  . Vitamin D deficiency     She has a past surgical history that includes Cholecystectomy; Tubal ligation; and Lung removal, partial.   Her family history includes Cancer in her brother and mother; Heart disease in her father; Hypertension in her mother; Stroke in her mother.She reports that she quit smoking about 10 years ago. Her smoking use included Cigarettes. She has never used smokeless tobacco. She reports that she does not drink alcohol or use drugs.  Current Outpatient Prescriptions on File Prior to Visit  Medication Sig Dispense Refill  . Acetaminophen (TYLENOL 8 HOUR PO) Take by mouth daily. TAKE THREE TABLETS WITH BID EACH MEAL.    Marland Kitchen aspirin 81 MG tablet Take 81 mg by mouth 3 (three) times daily.     . Cholecalciferol 4000 UNITS CAPS Take 4,000 capsules by mouth daily.    . clonazePAM (KLONOPIN) 0.5 MG tablet TAKE 1 TABLET BY MOUTH TWICE A DAY AS NEEDED FOR ANXIETY 60 tablet 1  . furosemide (LASIX) 20 MG tablet Take 1 tablet (20 mg total) by mouth daily. 30 tablet 3  . Homeopathic Products ([employer] LEG CRAMPS PAIN RELIEF) TABS Take by mouth as needed. [employer]    . olmesartan-hydrochlorothiazide (BENICAR HCT) 40-12.5 MG tablet  Take 1 tablet by mouth daily. 30 tablet 2  . sertraline (ZOLOFT) 100 MG tablet TAKE 1 TABLET (100 MG TOTAL) BY MOUTH DAILY. 30 tablet 5  . Vitamin D, Ergocalciferol, (DRISDOL) 50000 units CAPS capsule Take 1 capsule (50,000 Units total) by mouth every 7 (seven) days. 12 capsule 5   No current facility-administered medications on file prior to visit.      Objective:  Objective  Physical Exam BP 118/60 (BP Location: Left Arm, Patient Position: Sitting, Cuff Size: Large)   Pulse 74   Temp 98.2 F (36.8 C)   Resp 17   Ht '5\' 4"'$  (1.626 m)   Wt 277 lb 3.2 oz (125.7 kg)   SpO2 96%   BMI 47.58 kg/m  Wt Readings from Last 3 Encounters:  12/08/15 277 lb 3.2 oz (125.7 kg)  10/23/15 282 lb 11.2 oz (128.2 kg)  10/21/15 281 lb 9.6 oz (127.7 kg)     Lab Results  Component Value Date   WBC 7.2 10/16/2015   HGB 12.9 10/16/2015   HCT 39.1 10/16/2015   PLT 187 10/16/2015   GLUCOSE 98 10/16/2015   CHOL 160 04/04/2015   TRIG 220 (H) 04/04/2015   HDL 38 (L) 04/04/2015   LDLCALC 78 04/04/2015   ALT 16 10/16/2015   AST 18 10/16/2015   NA 142 10/16/2015   K 5.0 10/16/2015   CL 101 08/21/2015   CREATININE 0.9 10/16/2015  BUN 19.8 10/16/2015   CO2 33 (H) 10/16/2015   TSH 1.93 10/17/2014   INR 1.0 10/16/2008   HGBA1C 5.5 01/18/2015    Ct Chest W Contrast  Result Date: 10/16/2015 CLINICAL DATA:  65 year old female with history of right-sided lung cancer status post right lower lobe wedge resection. Restaging examination. EXAM: CT CHEST WITH CONTRAST TECHNIQUE: Multidetector CT imaging of the chest was performed during intravenous contrast administration. CONTRAST:  33m ISOVUE-300 IOPAMIDOL (ISOVUE-300) INJECTION 61% COMPARISON:  Multiple priors, most recently 08/24/2014. FINDINGS: Mediastinum/Lymph Nodes: Heart size is normal. There is no significant pericardial fluid, thickening or pericardial calcification. There is atherosclerosis of the thoracic aorta, the great vessels of the  mediastinum and the coronary arteries, including calcified atherosclerotic plaque in the left main, left anterior descending and right coronary arteries. Calcifications of the aortic valve. There is prominent nodal tissue in the subcarinal region. This appears to be a conglomerate of smaller lymph nodes, but this measures 3.1 x 1.9 cm overall (image 69 of series 2), and is overall similar to prior examinations dating back to at least 10/13/2011. No other mediastinal or hilar lymphadenopathy is noted. Esophagus is normal in appearance. No axillary lymphadenopathy. Lungs/Pleura: Postoperative changes of wedge resection in the right lower lobe are again noted. Mild postoperative architectural distortion in the right lower lobe is similar to prior studies. Chronic right-sided pleural thickening again noted. Focal area of expansion of extrapleural fat in the periphery of the right hemithorax posterolaterally (image 37 of series 2), unchanged, presumably related to prior surgery. No new suspicious appearing pulmonary nodules or masses. No acute consolidative airspace disease. Upper Abdomen: Diffuse low attenuation throughout the hepatic parenchyma, compatible with hepatic steatosis. Status post cholecystectomy. Atherosclerosis. Musculoskeletal/Soft Tissues: There are no aggressive appearing lytic or blastic lesions noted in the visualized portions of the skeleton. IMPRESSION: 1. Stable postoperative findings in the right chest related to prior right lower lobe wedge resection. No definite findings to suggest recurrent or metastatic disease in the thorax. 2. Persistent prominence of subcarinal nodal tissue dating back to 10/13/2011, likely a conglomeration of small lymph nodes. 3. Atherosclerosis, including left main and 2 vessel coronary artery disease. Please note that although the presence of coronary artery calcium documents the presence of coronary artery disease, the severity of this disease and any potential  stenosis cannot be assessed on this non-gated CT examination. Assessment for potential risk factor modification, dietary therapy or pharmacologic therapy may be warranted, if clinically indicated. 4. There are calcifications of the aortic valve. Echocardiographic correlation for evaluation of potential valvular dysfunction may be warranted if clinically indicated. 5. Hepatic steatosis. Electronically Signed   By: DVinnie LangtonM.D.   On: 10/16/2015 14:05     Assessment & Plan:  Plan  I am having Ms. Susman maintain her CVS LEG CRAMPS PAIN RELIEF, Cholecalciferol, aspirin, Acetaminophen (TYLENOL 8 HOUR PO), clonazePAM, olmesartan-hydrochlorothiazide, sertraline, Vitamin D (Ergocalciferol), and furosemide.  No orders of the defined types were placed in this encounter.   Problem List Items Addressed This Visit    None    Visit Diagnoses    Need for pneumococcal vaccination    -  Primary   Relevant Orders   Pneumococcal polysaccharide vaccine 23-valent greater than or equal to 2yo subcutaneous/IM      Follow-up: No Follow-up on file.  YAnn Held DO    Subjective:     Elizabeth FRESEis a 66y.o. female and is here for a comprehensive physical exam. The patient  reports no problems.  Social History   Social History  . Marital status: Divorced    Spouse name: N/A  . Number of children: 1  . Years of education: N/A   Occupational History  . [employer] History Main Topics  . Smoking status: Former Smoker    Types: Cigarettes    Quit date: 02/01/2005  . Smokeless tobacco: Never Used  . Alcohol use No  . Drug use: No  . Sexual activity: Yes   Other Topics Concern  . Not on file   Social History Narrative   Exercise--  no   Health Maintenance  Topic Date Due  . INFLUENZA VACCINE  12/02/2015  . MAMMOGRAM  12/11/2016  . TETANUS/TDAP  10/10/2021  . COLONOSCOPY  02/10/2025  . DEXA SCAN  Completed  . ZOSTAVAX  Completed  .  Hepatitis C Screening  Completed  . PNA vac Low Risk Adult  Completed    The following portions of the patient's history were reviewed and updated as appropriate: She  has a past medical history of Aortic stenosis, mild (01/08/2015); Cancer ([place]) (lung ); cancer of lung (6/10); Hyperlipidemia; Hypertension; Low back pain; Morbid obesity ([place]) (01/08/2015); and Vitamin D deficiency. She  does not have any pertinent problems on file. She  has a past surgical history that includes Cholecystectomy; Tubal ligation; and Lung removal, partial. Her family history includes Cancer in her brother and mother; Heart disease in her father; Hypertension in her mother; Stroke in her mother. She  reports that she quit smoking about 10 years ago. Her smoking use included Cigarettes. She has never used smokeless tobacco. She reports that she does not drink alcohol or use drugs. She has a current medication list which includes the following prescription(s): acetaminophen, aspirin, cholecalciferol, clonazepam, furosemide, cvs leg cramps pain relief, olmesartan-hydrochlorothiazide, sertraline, and vitamin d (ergocalciferol). Current Outpatient Prescriptions on File Prior to Visit  Medication Sig Dispense Refill  . Acetaminophen (TYLENOL 8 HOUR PO) Take by mouth daily. TAKE THREE TABLETS WITH BID EACH MEAL.    Marland Kitchen aspirin 81 MG tablet Take 81 mg by mouth 3 (three) times daily.     . Cholecalciferol 4000 UNITS CAPS Take 4,000 capsules by mouth daily.    . clonazePAM (KLONOPIN) 0.5 MG tablet TAKE 1 TABLET BY MOUTH TWICE A DAY AS NEEDED FOR ANXIETY 60 tablet 1  . furosemide (LASIX) 20 MG tablet Take 1 tablet (20 mg total) by mouth daily. 30 tablet 3  . Homeopathic Products ([employer] LEG CRAMPS PAIN RELIEF) TABS Take by mouth as needed. [employer]    . olmesartan-hydrochlorothiazide (BENICAR HCT) 40-12.5 MG tablet Take 1 tablet by mouth daily. 30 tablet 2  . sertraline (ZOLOFT) 100 MG tablet TAKE 1 TABLET (100 MG TOTAL) BY MOUTH  DAILY. 30 tablet 5  . Vitamin D, Ergocalciferol, (DRISDOL) 50000 units CAPS capsule Take 1 capsule (50,000 Units total) by mouth every 7 (seven) days. 12 capsule 5   No current facility-administered medications on file prior to visit.    She is allergic to penicillins..  Review of Systems Review of Systems  Constitutional: Negative for activity change, appetite change and fatigue.  HENT: Negative for hearing loss, congestion, tinnitus and ear discharge.  dentist-- due Eyes: Negative for visual disturbance (see optho--- due) Respiratory: Negative for cough, chest tightness and shortness of breath.   Cardiovascular: Negative for chest pain, palpitations and leg swelling.  Gastrointestinal: Negative for abdominal pain, diarrhea, constipation and abdominal distention.  Genitourinary: Negative for urgency, frequency, decreased urine volume and difficulty urinating.  Musculoskeletal: Negative for back pain, arthralgias and gait problem.  Skin: Negative for color change, pallor and rash.  Neurological: Negative for dizziness, light-headedness, numbness and headaches.  Hematological: Negative for adenopathy. Does not bruise/bleed easily.  Psychiatric/Behavioral: Negative for suicidal ideas, confusion, sleep disturbance, self-injury, dysphoric mood, decreased concentration and agitation.       Objective:     BP 118/60 (BP Location: Left Arm, Patient Position: Sitting, Cuff Size: Large)   Pulse 74   Temp 98.2 F (36.8 C)   Resp 17   Ht '5\' 4"'$  (1.626 m)   Wt 277 lb 3.2 oz (125.7 kg)   SpO2 96%   BMI 47.58 kg/m  General appearance: alert, cooperative, appears stated age and no distress Head: Normocephalic, without obvious abnormality, atraumatic Eyes: conjunctivae/corneas clear. PERRL, EOM's intact. Fundi benign. Ears: normal TM's and external ear canals both ears Nose: Nares normal. Septum midline. Mucosa normal. No drainage or sinus tenderness. Throat: lips, mucosa, and tongue normal;  teeth and gums normal Neck: no adenopathy, no carotid bruit, no JVD, supple, symmetrical, trachea midline and thyroid not enlarged, symmetric, no tenderness/mass/nodules Back: symmetric, no curvature. ROM normal. No CVA tenderness. Lungs: clear to auscultation bilaterally Breasts: normal appearance, no masses or tenderness Heart: regular rate and rhythm, S1, S2 normal, no murmur, click, rub or gallop Abdomen: soft, non-tender; bowel sounds normal; no masses,  no organomegaly Pelvic: deferred--gyn Extremities: extremities normal, atraumatic, no cyanosis or edema Pulses: 2+ and symmetric Skin: Skin color, texture, turgor normal. No rashes or lesions Lymph nodes: Cervical, supraclavicular, and axillary nodes normal. Neurologic: Alert and oriented X 3, normal strength and tone. Normal symmetric reflexes. Normal coordination and gait Assessment:    Healthy female exam.      Plan:    ghm utd Check labs See After Visit Summary for Counseling Recommendations    1. Need for pneumococcal vaccination   - Pneumococcal polysaccharide vaccine 23-valent greater than or equal to 2yo subcutaneous/IM  2. Preventative health care See above - Comprehensive metabolic panel - Lipid panel - CBC with Differential/Platelet - POCT urinalysis dipstick - TSH  3. Anxiety disorder, unspecified anxiety disorder type stable - sertraline (ZOLOFT) 100 MG tablet; TAKE 1 TABLET (100 MG TOTAL) BY MOUTH DAILY.  Dispense: 90 tablet; Refill: 3  4. Essential hypertension stable - olmesartan-hydrochlorothiazide (BENICAR HCT) 40-12.5 MG tablet; Take 1 tablet by mouth daily.  Dispense: 90 tablet; Refill: 3

## 2015-12-08 NOTE — Patient Instructions (Signed)
Preventive Care for Adults, Female A healthy lifestyle and preventive care can promote health and wellness. Preventive health guidelines for women include the following key practices.  A routine yearly physical is a good way to check with your health care provider about your health and preventive screening. It is a chance to share any concerns and updates on your health and to receive a thorough exam.  Visit your dentist for a routine exam and preventive care every 6 months. Brush your teeth twice a day and floss once a day. Good oral hygiene prevents tooth decay and gum disease.  The frequency of eye exams is based on your age, health, family medical history, use of contact lenses, and other factors. Follow your health care provider's recommendations for frequency of eye exams.  Eat a healthy diet. Foods like vegetables, fruits, whole grains, low-fat dairy products, and lean protein foods contain the nutrients you need without too many calories. Decrease your intake of foods high in solid fats, added sugars, and salt. Eat the right amount of calories for you.Get information about a proper diet from your health care provider, if necessary.  Regular physical exercise is one of the most important things you can do for your health. Most adults should get at least 150 minutes of moderate-intensity exercise (any activity that increases your heart rate and causes you to sweat) each week. In addition, most adults need muscle-strengthening exercises on 2 or more days a week.  Maintain a healthy weight. The body mass index (BMI) is a screening tool to identify possible weight problems. It provides an estimate of body fat based on height and weight. Your health care provider can find your BMI and can help you achieve or maintain a healthy weight.For adults 20 years and older:  A BMI below 18.5 is considered underweight.  A BMI of 18.5 to 24.9 is normal.  A BMI of 25 to 29.9 is considered overweight.  A  BMI of 30 and above is considered obese.  Maintain normal blood lipids and cholesterol levels by exercising and minimizing your intake of saturated fat. Eat a balanced diet with plenty of fruit and vegetables. Blood tests for lipids and cholesterol should begin at age 45 and be repeated every 5 years. If your lipid or cholesterol levels are high, you are over 50, or you are at high risk for heart disease, you may need your cholesterol levels checked more frequently.Ongoing high lipid and cholesterol levels should be treated with medicines if diet and exercise are not working.  If you smoke, find out from your health care provider how to quit. If you do not use tobacco, do not start.  Lung cancer screening is recommended for adults aged 45-80 years who are at high risk for developing lung cancer because of a history of smoking. A yearly low-dose CT scan of the lungs is recommended for people who have at least a 30-pack-year history of smoking and are a current smoker or have quit within the past 15 years. A pack year of smoking is smoking an average of 1 pack of cigarettes a day for 1 year (for example: 1 pack a day for 30 years or 2 packs a day for 15 years). Yearly screening should continue until the smoker has stopped smoking for at least 15 years. Yearly screening should be stopped for people who develop a health problem that would prevent them from having lung cancer treatment.  If you are pregnant, do not drink alcohol. If you are  breastfeeding, be very cautious about drinking alcohol. If you are not pregnant and choose to drink alcohol, do not have more than 1 drink per day. One drink is considered to be 12 ounces (355 mL) of beer, 5 ounces (148 mL) of wine, or 1.5 ounces (44 mL) of liquor.  Avoid use of street drugs. Do not share needles with anyone. Ask for help if you need support or instructions about stopping the use of drugs.  High blood pressure causes heart disease and increases the risk  of stroke. Your blood pressure should be checked at least every 1 to 2 years. Ongoing high blood pressure should be treated with medicines if weight loss and exercise do not work.  If you are 65-73 years old, ask your health care provider if you should take aspirin to prevent strokes.  Diabetes screening is done by taking a blood sample to check your blood glucose level after you have not eaten for a certain period of time (fasting). If you are not overweight and you do not have risk factors for diabetes, you should be screened once every 3 years starting at age 66. If you are overweight or obese and you are 34-42 years of age, you should be screened for diabetes every year as part of your cardiovascular risk assessment.  Breast cancer screening is essential preventive care for women. You should practice "breast self-awareness." This means understanding the normal appearance and feel of your breasts and may include breast self-examination. Any changes detected, no matter how small, should be reported to a health care provider. Women in their 47s and 30s should have a clinical breast exam (CBE) by a health care provider as part of a regular health exam every 1 to 3 years. After age 32, women should have a CBE every year. Starting at age 17, women should consider having a mammogram (breast X-ray test) every year. Women who have a family history of breast cancer should talk to their health care provider about genetic screening. Women at a high risk of breast cancer should talk to their health care providers about having an MRI and a mammogram every year.  Breast cancer gene (BRCA)-related cancer risk assessment is recommended for women who have family members with BRCA-related cancers. BRCA-related cancers include breast, ovarian, tubal, and peritoneal cancers. Having family members with these cancers may be associated with an increased risk for harmful changes (mutations) in the breast cancer genes BRCA1 and  BRCA2. Results of the assessment will determine the need for genetic counseling and BRCA1 and BRCA2 testing.  Your health care provider may recommend that you be screened regularly for cancer of the pelvic organs (ovaries, uterus, and vagina). This screening involves a pelvic examination, including checking for microscopic changes to the surface of your cervix (Pap test). You may be encouraged to have this screening done every 3 years, beginning at age 102.  For women ages 55-65, health care providers may recommend pelvic exams and Pap testing every 3 years, or they may recommend the Pap and pelvic exam, combined with testing for human papilloma virus (HPV), every 5 years. Some types of HPV increase your risk of cervical cancer. Testing for HPV may also be done on women of any age with unclear Pap test results.  Other health care providers may not recommend any screening for nonpregnant women who are considered low risk for pelvic cancer and who do not have symptoms. Ask your health care provider if a screening pelvic exam is right for  you.  If you have had past treatment for cervical cancer or a condition that could lead to cancer, you need Pap tests and screening for cancer for at least 20 years after your treatment. If Pap tests have been discontinued, your risk factors (such as having a new sexual partner) need to be reassessed to determine if screening should resume. Some women have medical problems that increase the chance of getting cervical cancer. In these cases, your health care provider may recommend more frequent screening and Pap tests.  Colorectal cancer can be detected and often prevented. Most routine colorectal cancer screening begins at the age of 50 years and continues through age 75 years. However, your health care provider may recommend screening at an earlier age if you have risk factors for colon cancer. On a yearly basis, your health care provider may provide home test kits to check  for hidden blood in the stool. Use of a small camera at the end of a tube, to directly examine the colon (sigmoidoscopy or colonoscopy), can detect the earliest forms of colorectal cancer. Talk to your health care provider about this at age 50, when routine screening begins. Direct exam of the colon should be repeated every 5-10 years through age 75 years, unless early forms of precancerous polyps or small growths are found.  People who are at an increased risk for hepatitis B should be screened for this virus. You are considered at high risk for hepatitis B if:  You were born in a country where hepatitis B occurs often. Talk with your health care provider about which countries are considered high risk.  Your parents were born in a high-risk country and you have not received a shot to protect against hepatitis B (hepatitis B vaccine).  You have HIV or AIDS.  You use needles to inject street drugs.  You live with, or have sex with, someone who has hepatitis B.  You get hemodialysis treatment.  You take certain medicines for conditions like cancer, organ transplantation, and autoimmune conditions.  Hepatitis C blood testing is recommended for all people born from 1945 through 1965 and any individual with known risks for hepatitis C.  Practice safe sex. Use condoms and avoid high-risk sexual practices to reduce the spread of sexually transmitted infections (STIs). STIs include gonorrhea, chlamydia, syphilis, trichomonas, herpes, HPV, and human immunodeficiency virus (HIV). Herpes, HIV, and HPV are viral illnesses that have no cure. They can result in disability, cancer, and death.  You should be screened for sexually transmitted illnesses (STIs) including gonorrhea and chlamydia if:  You are sexually active and are younger than 24 years.  You are older than 24 years and your health care provider tells you that you are at risk for this type of infection.  Your sexual activity has changed  since you were last screened and you are at an increased risk for chlamydia or gonorrhea. Ask your health care provider if you are at risk.  If you are at risk of being infected with HIV, it is recommended that you take a prescription medicine daily to prevent HIV infection. This is called preexposure prophylaxis (PrEP). You are considered at risk if:  You are sexually active and do not regularly use condoms or know the HIV status of your partner(s).  You take drugs by injection.  You are sexually active with a partner who has HIV.  Talk with your health care provider about whether you are at high risk of being infected with HIV. If   you choose to begin PrEP, you should first be tested for HIV. You should then be tested every 3 months for as long as you are taking PrEP.  Osteoporosis is a disease in which the bones lose minerals and strength with aging. This can result in serious bone fractures or breaks. The risk of osteoporosis can be identified using a bone density scan. Women ages 38 years and over and women at risk for fractures or osteoporosis should discuss screening with their health care providers. Ask your health care provider whether you should take a calcium supplement or vitamin D to reduce the rate of osteoporosis.  Menopause can be associated with physical symptoms and risks. Hormone replacement therapy is available to decrease symptoms and risks. You should talk to your health care provider about whether hormone replacement therapy is right for you.  Use sunscreen. Apply sunscreen liberally and repeatedly throughout the day. You should seek shade when your shadow is shorter than you. Protect yourself by wearing long sleeves, pants, a wide-brimmed hat, and sunglasses year round, whenever you are outdoors.  Once a month, do a whole body skin exam, using a mirror to look at the skin on your back. Tell your health care provider of new moles, moles that have irregular borders, moles that  are larger than a pencil eraser, or moles that have changed in shape or color.  Stay current with required vaccines (immunizations).  Influenza vaccine. All adults should be immunized every year.  Tetanus, diphtheria, and acellular pertussis (Td, Tdap) vaccine. Pregnant women should receive 1 dose of Tdap vaccine during each pregnancy. The dose should be obtained regardless of the length of time since the last dose. Immunization is preferred during the 27th-36th week of gestation. An adult who has not previously received Tdap or who does not know her vaccine status should receive 1 dose of Tdap. This initial dose should be followed by tetanus and diphtheria toxoids (Td) booster doses every 10 years. Adults with an unknown or incomplete history of completing a 3-dose immunization series with Td-containing vaccines should begin or complete a primary immunization series including a Tdap dose. Adults should receive a Td booster every 10 years.  Varicella vaccine. An adult without evidence of immunity to varicella should receive 2 doses or a second dose if she has previously received 1 dose. Pregnant females who do not have evidence of immunity should receive the first dose after pregnancy. This first dose should be obtained before leaving the health care facility. The second dose should be obtained 4-8 weeks after the first dose.  Human papillomavirus (HPV) vaccine. Females aged 13-26 years who have not received the vaccine previously should obtain the 3-dose series. The vaccine is not recommended for use in pregnant females. However, pregnancy testing is not needed before receiving a dose. If a female is found to be pregnant after receiving a dose, no treatment is needed. In that case, the remaining doses should be delayed until after the pregnancy. Immunization is recommended for any person with an immunocompromised condition through the age of 57 years if she did not get any or all doses earlier. During the  3-dose series, the second dose should be obtained 4-8 weeks after the first dose. The third dose should be obtained 24 weeks after the first dose and 16 weeks after the second dose.  Zoster vaccine. One dose is recommended for adults aged 62 years or older unless certain conditions are present.  Measles, mumps, and rubella (MMR) vaccine. Adults born  before 1957 generally are considered immune to measles and mumps. Adults born in 1957 or later should have 1 or more doses of MMR vaccine unless there is a contraindication to the vaccine or there is laboratory evidence of immunity to each of the three diseases. A routine second dose of MMR vaccine should be obtained at least 28 days after the first dose for students attending postsecondary schools, health care workers, or international travelers. People who received inactivated measles vaccine or an unknown type of measles vaccine during 1963-1967 should receive 2 doses of MMR vaccine. People who received inactivated mumps vaccine or an unknown type of mumps vaccine before 1979 and are at high risk for mumps infection should consider immunization with 2 doses of MMR vaccine. For females of childbearing age, rubella immunity should be determined. If there is no evidence of immunity, females who are not pregnant should be vaccinated. If there is no evidence of immunity, females who are pregnant should delay immunization until after pregnancy. Unvaccinated health care workers born before 1957 who lack laboratory evidence of measles, mumps, or rubella immunity or laboratory confirmation of disease should consider measles and mumps immunization with 2 doses of MMR vaccine or rubella immunization with 1 dose of MMR vaccine.  Pneumococcal 13-valent conjugate (PCV13) vaccine. When indicated, a person who is uncertain of his immunization history and has no record of immunization should receive the PCV13 vaccine. All adults 65 years of age and older should receive this  vaccine. An adult aged 19 years or older who has certain medical conditions and has not been previously immunized should receive 1 dose of PCV13 vaccine. This PCV13 should be followed with a dose of pneumococcal polysaccharide (PPSV23) vaccine. Adults who are at high risk for pneumococcal disease should obtain the PPSV23 vaccine at least 8 weeks after the dose of PCV13 vaccine. Adults older than 65 years of age who have normal immune system function should obtain the PPSV23 vaccine dose at least 1 year after the dose of PCV13 vaccine.  Pneumococcal polysaccharide (PPSV23) vaccine. When PCV13 is also indicated, PCV13 should be obtained first. All adults aged 65 years and older should be immunized. An adult younger than age 65 years who has certain medical conditions should be immunized. Any person who resides in a nursing home or long-term care facility should be immunized. An adult smoker should be immunized. People with an immunocompromised condition and certain other conditions should receive both PCV13 and PPSV23 vaccines. People with human immunodeficiency virus (HIV) infection should be immunized as soon as possible after diagnosis. Immunization during chemotherapy or radiation therapy should be avoided. Routine use of PPSV23 vaccine is not recommended for American Indians, [place] Natives, or people younger than 65 years unless there are medical conditions that require PPSV23 vaccine. When indicated, people who have unknown immunization and have no record of immunization should receive PPSV23 vaccine. One-time revaccination 5 years after the first dose of PPSV23 is recommended for people aged 19-64 years who have chronic kidney failure, nephrotic syndrome, asplenia, or immunocompromised conditions. People who received 1-2 doses of PPSV23 before age 65 years should receive another dose of PPSV23 vaccine at age 65 years or later if at least 5 years have passed since the previous dose. Doses of PPSV23 are not  needed for people immunized with PPSV23 at or after age 65 years.  Meningococcal vaccine. Adults with asplenia or persistent complement component deficiencies should receive 2 doses of quadrivalent meningococcal conjugate (MenACWY-D) vaccine. The doses should be obtained   at least 2 months apart. Microbiologists working with certain meningococcal bacteria, [place] recruits, people at risk during an outbreak, and people who travel to or live in countries with a high rate of meningitis should be immunized. A first-year college student up through age 34 years who is living in a residence hall should receive a dose if she did not receive a dose on or after her 16th birthday. Adults who have certain high-risk conditions should receive one or more doses of vaccine.  Hepatitis A vaccine. Adults who wish to be protected from this disease, have certain high-risk conditions, work with hepatitis A-infected animals, work in hepatitis A research labs, or travel to or work in countries with a high rate of hepatitis A should be immunized. Adults who were previously unvaccinated and who anticipate close contact with an international adoptee during the first 60 days after arrival in the [place] from a country with a high rate of hepatitis A should be immunized.  Hepatitis B vaccine. Adults who wish to be protected from this disease, have certain high-risk conditions, may be exposed to blood or other infectious body fluids, are household contacts or sex partners of hepatitis B positive people, are clients or workers in certain care facilities, or travel to or work in countries with a high rate of hepatitis B should be immunized.  Haemophilus influenzae type b (Hib) vaccine. A previously unvaccinated person with asplenia or sickle cell disease or having a scheduled splenectomy should receive 1 dose of Hib vaccine. Regardless of previous immunization, a recipient of a hematopoietic stem cell transplant should receive a  3-dose series 6-12 months after her successful transplant. Hib vaccine is not recommended for adults with HIV infection. Preventive Services / Frequency Ages 39 to 87 years  Blood pressure check.** / Every 3-5 years.  Lipid and cholesterol check.** / Every 5 years beginning at age 17.  Clinical breast exam.** / Every 3 years for women in their 74s and 50s.  BRCA-related cancer risk assessment.** / For women who have family members with a BRCA-related cancer (breast, ovarian, tubal, or peritoneal cancers).  Pap test.** / Every 2 years from ages 54 through 75. Every 3 years starting at age 14 through age 83 or 56 with a history of 3 consecutive normal Pap tests.  HPV screening.** / Every 3 years from ages 46 through ages 55 to 1 with a history of 3 consecutive normal Pap tests.  Hepatitis C blood test.** / For any individual with known risks for hepatitis C.  Skin self-exam. / Monthly.  Influenza vaccine. / Every year.  Tetanus, diphtheria, and acellular pertussis (Tdap, Td) vaccine.** / Consult your health care provider. Pregnant women should receive 1 dose of Tdap vaccine during each pregnancy. 1 dose of Td every 10 years.  Varicella vaccine.** / Consult your health care provider. Pregnant females who do not have evidence of immunity should receive the first dose after pregnancy.  HPV vaccine. / 3 doses over 6 months, if 41 and younger. The vaccine is not recommended for use in pregnant females. However, pregnancy testing is not needed before receiving a dose.  Measles, mumps, rubella (MMR) vaccine.** / You need at least 1 dose of MMR if you were born in 1957 or later. You may also need a 2nd dose. For females of childbearing age, rubella immunity should be determined. If there is no evidence of immunity, females who are not pregnant should be vaccinated. If there is no evidence of immunity, females who are  pregnant should delay immunization until after pregnancy.  Pneumococcal  13-valent conjugate (PCV13) vaccine.** / Consult your health care provider.  Pneumococcal polysaccharide (PPSV23) vaccine.** / 1 to 2 doses if you smoke cigarettes or if you have certain conditions.  Meningococcal vaccine.** / 1 dose if you are age 68 to 8 years and a Market researcher living in a residence hall, or have one of several medical conditions, you need to get vaccinated against meningococcal disease. You may also need additional booster doses.  Hepatitis A vaccine.** / Consult your health care provider.  Hepatitis B vaccine.** / Consult your health care provider.  Haemophilus influenzae type b (Hib) vaccine.** / Consult your health care provider. Ages 7 to 53 years  Blood pressure check.** / Every year.  Lipid and cholesterol check.** / Every 5 years beginning at age 25 years.  Lung cancer screening. / Every year if you are aged 11-80 years and have a 30-pack-year history of smoking and currently smoke or have quit within the past 15 years. Yearly screening is stopped once you have quit smoking for at least 15 years or develop a health problem that would prevent you from having lung cancer treatment.  Clinical breast exam.** / Every year after age 48 years.  BRCA-related cancer risk assessment.** / For women who have family members with a BRCA-related cancer (breast, ovarian, tubal, or peritoneal cancers).  Mammogram.** / Every year beginning at age 41 years and continuing for as long as you are in good health. Consult with your health care provider.  Pap test.** / Every 3 years starting at age 65 years through age 37 or 70 years with a history of 3 consecutive normal Pap tests.  HPV screening.** / Every 3 years from ages 72 years through ages 60 to 40 years with a history of 3 consecutive normal Pap tests.  Fecal occult blood test (FOBT) of stool. / Every year beginning at age 21 years and continuing until age 5 years. You may not need to do this test if you get  a colonoscopy every 10 years.  Flexible sigmoidoscopy or colonoscopy.** / Every 5 years for a flexible sigmoidoscopy or every 10 years for a colonoscopy beginning at age 35 years and continuing until age 48 years.  Hepatitis C blood test.** / For all people born from [birth year] through 1966 and any individual with known risks for hepatitis C.  Skin self-exam. / Monthly.  Influenza vaccine. / Every year.  Tetanus, diphtheria, and acellular pertussis (Tdap/Td) vaccine.** / Consult your health care provider. Pregnant women should receive 1 dose of Tdap vaccine during each pregnancy. 1 dose of Td every 10 years.  Varicella vaccine.** / Consult your health care provider. Pregnant females who do not have evidence of immunity should receive the first dose after pregnancy.  Zoster vaccine.** / 1 dose for adults aged 30 years or older.  Measles, mumps, rubella (MMR) vaccine.** / You need at least 1 dose of MMR if you were born in 1957 or later. You may also need a second dose. For females of childbearing age, rubella immunity should be determined. If there is no evidence of immunity, females who are not pregnant should be vaccinated. If there is no evidence of immunity, females who are pregnant should delay immunization until after pregnancy.  Pneumococcal 13-valent conjugate (PCV13) vaccine.** / Consult your health care provider.  Pneumococcal polysaccharide (PPSV23) vaccine.** / 1 to 2 doses if you smoke cigarettes or if you have certain conditions.  Meningococcal vaccine.** /  Consult your health care provider.  Hepatitis A vaccine.** / Consult your health care provider.  Hepatitis B vaccine.** / Consult your health care provider.  Haemophilus influenzae type b (Hib) vaccine.** / Consult your health care provider. Ages 59 years and over  Blood pressure check.** / Every year.  Lipid and cholesterol check.** / Every 5 years beginning at age 12 years.  Lung cancer screening. / Every year if you  are aged 4-80 years and have a 30-pack-year history of smoking and currently smoke or have quit within the past 15 years. Yearly screening is stopped once you have quit smoking for at least 15 years or develop a health problem that would prevent you from having lung cancer treatment.  Clinical breast exam.** / Every year after age 62 years.  BRCA-related cancer risk assessment.** / For women who have family members with a BRCA-related cancer (breast, ovarian, tubal, or peritoneal cancers).  Mammogram.** / Every year beginning at age 53 years and continuing for as long as you are in good health. Consult with your health care provider.  Pap test.** / Every 3 years starting at age 12 years through age 41 or 75 years with 3 consecutive normal Pap tests. Testing can be stopped between 65 and 70 years with 3 consecutive normal Pap tests and no abnormal Pap or HPV tests in the past 10 years.  HPV screening.** / Every 3 years from ages 68 years through ages 70 or 75 years with a history of 3 consecutive normal Pap tests. Testing can be stopped between 65 and 70 years with 3 consecutive normal Pap tests and no abnormal Pap or HPV tests in the past 10 years.  Fecal occult blood test (FOBT) of stool. / Every year beginning at age 65 years and continuing until age 44 years. You may not need to do this test if you get a colonoscopy every 10 years.  Flexible sigmoidoscopy or colonoscopy.** / Every 5 years for a flexible sigmoidoscopy or every 10 years for a colonoscopy beginning at age 55 years and continuing until age 9 years.  Hepatitis C blood test.** / For all people born from [birth year] through 1966 and any individual with known risks for hepatitis C.  Osteoporosis screening.** / A one-time screening for women ages 25 years and over and women at risk for fractures or osteoporosis.  Skin self-exam. / Monthly.  Influenza vaccine. / Every year.  Tetanus, diphtheria, and acellular pertussis (Tdap/Td)  vaccine.** / 1 dose of Td every 10 years.  Varicella vaccine.** / Consult your health care provider.  Zoster vaccine.** / 1 dose for adults aged 69 years or older.  Pneumococcal 13-valent conjugate (PCV13) vaccine.** / Consult your health care provider.  Pneumococcal polysaccharide (PPSV23) vaccine.** / 1 dose for all adults aged 21 years and older.  Meningococcal vaccine.** / Consult your health care provider.  Hepatitis A vaccine.** / Consult your health care provider.  Hepatitis B vaccine.** / Consult your health care provider.  Haemophilus influenzae type b (Hib) vaccine.** / Consult your health care provider. ** Family history and personal history of risk and conditions may change your health care provider's recommendations.   This information is not intended to replace advice given to you by your health care provider. Make sure you discuss any questions you have with your health care provider.   Document Released: 06/15/2001 Document Revised: 05/10/2014 Document Reviewed: 09/14/2010 Elsevier Interactive Patient Education [employer].

## 2015-12-23 ENCOUNTER — Ambulatory Visit (HOSPITAL_BASED_OUTPATIENT_CLINIC_OR_DEPARTMENT_OTHER)
Admission: RE | Admit: 2015-12-23 | Discharge: 2015-12-23 | Disposition: A | Discharge status: Home / Self Care | Payer: BC Managed Care – PPO | Source: Ambulatory Visit | Attending: Family Medicine | Admitting: Family Medicine

## 2015-12-23 ENCOUNTER — Telehealth: Payer: Self-pay | Admitting: Family Medicine

## 2015-12-23 DIAGNOSIS — Z1231 Encounter for screening mammogram for malignant neoplasm of breast: Secondary | ICD-10-CM | POA: Insufficient documentation

## 2015-12-23 NOTE — Telephone Encounter (Signed)
She could get shots at this time.  There would be no copay, just the shots (we charged copay and did full evaluation a couple months ago).

## 2015-12-25 ENCOUNTER — Ambulatory Visit (INDEPENDENT_AMBULATORY_CARE_PROVIDER_SITE_OTHER): Payer: BC Managed Care – PPO | Admitting: Family Medicine

## 2015-12-25 ENCOUNTER — Encounter: Payer: Self-pay | Admitting: Family Medicine

## 2015-12-25 VITALS — BP 115/70 | HR 65 | Ht 64.0 in | Wt 270.0 lb

## 2015-12-25 DIAGNOSIS — M129 Arthropathy, unspecified: Secondary | ICD-10-CM | POA: Diagnosis not present

## 2015-12-25 DIAGNOSIS — M25572 Pain in left ankle and joints of left foot: Secondary | ICD-10-CM | POA: Diagnosis not present

## 2015-12-25 DIAGNOSIS — M25561 Pain in right knee: Secondary | ICD-10-CM | POA: Diagnosis not present

## 2015-12-25 DIAGNOSIS — M1711 Unilateral primary osteoarthritis, right knee: Secondary | ICD-10-CM

## 2015-12-26 DIAGNOSIS — M129 Arthropathy, unspecified: Secondary | ICD-10-CM | POA: Diagnosis not present

## 2015-12-26 DIAGNOSIS — M25572 Pain in left ankle and joints of left foot: Secondary | ICD-10-CM | POA: Diagnosis not present

## 2015-12-26 MED ORDER — METHYLPREDNISOLONE ACETATE 40 MG/ML IJ SUSP
40.0000 mg | Freq: Once | INTRAMUSCULAR | Status: AC
  Administered 2015-12-26: 40 mg via INTRA_ARTICULAR

## 2015-12-30 NOTE — Progress Notes (Signed)
PCP and consultation requested by: Ann Held, DO  Subjective:   HPI: Patient is a 65 y.o. female here for left foot/ankle pain.  12/5: Patient does not recall an injury. She reports having almost 6 months of lateral anterior left ankle pain. Pain level 4/10, constant and sharp. Worse when on feet a lot. No swelling or bruising. No skin changes, fever, other complaints.  06/03/15: Patient reports pain worsened since last visit. Pain level 8/10, sharp lateral ankle and anteriorly. Swelling comes and goes. Worse by end of day. No skin cahnges, fever, other complaints.  4/12: Patient reports she did well with injection for left ankle but pain started coming back past 1-2 weeks. Pain is 2/10, sharp anterior left ankle. Using arch supports. Some slight swelling. Worse with ambulation. 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 No locking, giving out. No skin changes, numbness.  6/14: Patient reports over past few weeks pain has returned in left ankle and right knee. Pain level 3/10 in knee, 5/10 in ankle. Pain in knee is anteromedial, worse with walking and can be sharp. Pain in ankle is still lateral, sharp. Also worse with ambulation. No catching, locking of knee or ankle. Will feel like left heel is going to give out. No skin changes, numbness.  8/24: Patient returns for injections for left ankle and right knee. Pain level 9/10 in ankle, 7/10 in right knee.  Past Medical History:  Diagnosis Date  . Aortic stenosis, mild 01/08/2015  . Cancer ([place]) lung    lung  . Hx of cancer of lung 6/10   Removed June 2010  . Hyperlipidemia   . Hypertension   . Low back pain   . Morbid obesity ([place]) 01/08/2015  . Vitamin D deficiency     Current Outpatient Prescriptions on File Prior to Visit  Medication Sig Dispense Refill  . Acetaminophen (TYLENOL 8 HOUR PO) Take by mouth daily. TAKE THREE TABLETS WITH BID EACH MEAL.    Marland Kitchen aspirin 81 MG  tablet Take 81 mg by mouth 3 (three) times daily.     . Cholecalciferol 4000 UNITS CAPS Take 4,000 capsules by mouth daily.    . clonazePAM (KLONOPIN) 0.5 MG tablet TAKE 1 TABLET BY MOUTH TWICE A DAY AS NEEDED FOR ANXIETY 60 tablet 1  . furosemide (LASIX) 20 MG tablet Take 1 tablet (20 mg total) by mouth daily. 30 tablet 3  . Homeopathic Products ([employer] LEG CRAMPS PAIN RELIEF) TABS Take by mouth as needed. [employer]    . olmesartan-hydrochlorothiazide (BENICAR HCT) 40-12.5 MG tablet Take 1 tablet by mouth daily. 90 tablet 3  . sertraline (ZOLOFT) 100 MG tablet TAKE 1 TABLET (100 MG TOTAL) BY MOUTH DAILY. 90 tablet 3  . Vitamin D, Ergocalciferol, (DRISDOL) 50000 units CAPS capsule Take 1 capsule (50,000 Units total) by mouth every 7 (seven) days. 12 capsule 5   No current facility-administered medications on file prior to visit.     Past Surgical History:  Procedure Laterality Date  . CHOLECYSTECTOMY    . LUNG REMOVAL, PARTIAL    . TUBAL LIGATION      Allergies  Allergen Reactions  . Penicillins Other (See Comments)    Fever     Social History   Social History  . Marital status: Divorced    Spouse name: N/A  . Number of children: 1  . Years of education: N/A   Occupational History  . [employer]  Social History Main Topics  . Smoking status: Former Smoker    Types: Cigarettes    Quit date: 02/01/2005  . Smokeless tobacco: Never Used  . Alcohol use No  . Drug use: No  . Sexual activity: Yes   Other Topics Concern  . Not on file   Social History Narrative   Exercise--  no    Family History  Problem Relation Age of Onset  . Hypertension Mother   . Stroke Mother   . Cancer Mother     lung  . Heart disease Father     cabg  . Cancer Brother     skin    BP 115/70   Pulse 65   Ht '5\' 4"'$  (1.626 m)   Wt 270 lb (122.5 kg)   BMI 46.35 kg/m   Review of Systems: See HPI above.    Objective:  Physical Exam:  Gen: NAD Exam  not repeated today.  Left foot/ankle: Pes planus. Mild diffuse swelling.  No other gross deformity, ecchymoses FROM with 5/5 strength all directions TTP over sinus tarsi. Negative ant drawer and talar tilt.   Negative syndesmotic compression. Thompsons test negative. NV intact distally.  Right foot/ankle: FROM without pain.    Right knee: No gross deformity, ecchymoses, effusion. TTP mildly medial and lateral joint lines. FROM. Negative ant/post drawers. Negative valgus/varus testing. Negative lachmanns. Negative mcmurrays, apleys, patellar apprehension. NV intact distally.  Assessment & Plan:  1. Left ankle pain - consistent with sinus tarsi syndrome, underlying ankle DJD.  Repeated intraarticular injection today.  Continue arch supports, home exercises.  Heat/ice.  Has tried ASO.  Consider radiographs or custom orthotics if not improving.  F/u prn otherwise.  After informed written consent, patient was seated on exam table. Right ankle joint identified by ultrasound laterally, was prepped with alcohol swab, and utilizing anterolateral approach, patient's right ankle was injected intraarticularly with 2:1 marcaine: depomedrol. Patient tolerated the procedure well without immediate complications.  2. Right knee pain - known DJD.  Previously discussed tylenol, nsaids, glucosamine, topical medications.  Repeated intraarticular cortisone injection.  Discussed viscosupplementation also.  F/u prn.  After informed written consent, patient was seated on exam table. Right knee was prepped with alcohol swab and utilizing anteromedial approach, patient's right knee was injected intraarticularly with 3:1 marcaine: depomedrol. Patient tolerated the procedure well without immediate complications.

## 2015-12-31 NOTE — Assessment & Plan Note (Signed)
known DJD.  Previously discussed tylenol, nsaids, glucosamine, topical medications.  Repeated intraarticular cortisone injection.  Discussed viscosupplementation also.  F/u prn.  After informed written consent, patient was seated on exam table. Right knee was prepped with alcohol swab and utilizing anteromedial approach, patient's right knee was injected intraarticularly with 3:1 marcaine: depomedrol. Patient tolerated the procedure well without immediate complications.

## 2015-12-31 NOTE — Assessment & Plan Note (Signed)
consistent with sinus tarsi syndrome, underlying ankle DJD.  Repeated intraarticular injection today.  Continue arch supports, home exercises.  Heat/ice.  Has tried ASO.  Consider radiographs or custom orthotics if not improving.  F/u prn otherwise.  After informed written consent, patient was seated on exam table. Right ankle joint identified by ultrasound laterally, was prepped with alcohol swab, and utilizing anterolateral approach, patient's right ankle was injected intraarticularly with 2:1 marcaine: depomedrol. Patient tolerated the procedure well without immediate complications.

## 2016-01-11 ENCOUNTER — Encounter: Payer: Self-pay | Admitting: Family Medicine

## 2016-01-13 ENCOUNTER — Ambulatory Visit: Payer: Self-pay | Admitting: Family Medicine

## 2016-01-13 ENCOUNTER — Encounter: Payer: Self-pay | Admitting: Family Medicine

## 2016-01-13 ENCOUNTER — Ambulatory Visit (INDEPENDENT_AMBULATORY_CARE_PROVIDER_SITE_OTHER): Payer: BC Managed Care – PPO | Admitting: Family Medicine

## 2016-01-13 ENCOUNTER — Ambulatory Visit (HOSPITAL_BASED_OUTPATIENT_CLINIC_OR_DEPARTMENT_OTHER)
Admission: RE | Admit: 2016-01-13 | Discharge: 2016-01-13 | Disposition: A | Discharge status: Home / Self Care | Payer: BC Managed Care – PPO | Source: Ambulatory Visit | Attending: Family Medicine | Admitting: Family Medicine

## 2016-01-13 VITALS — BP 104/65 | HR 72 | Ht 64.0 in | Wt 270.0 lb

## 2016-01-13 DIAGNOSIS — M25561 Pain in right knee: Secondary | ICD-10-CM | POA: Insufficient documentation

## 2016-01-13 DIAGNOSIS — M25572 Pain in left ankle and joints of left foot: Secondary | ICD-10-CM

## 2016-01-13 DIAGNOSIS — M779 Enthesopathy, unspecified: Secondary | ICD-10-CM | POA: Diagnosis not present

## 2016-01-13 DIAGNOSIS — M25552 Pain in left hip: Secondary | ICD-10-CM

## 2016-01-13 NOTE — Patient Instructions (Signed)
Get x-rays downstairs as you leave today.   After we get these and the results I will call you to go over next steps for each joint.

## 2016-01-15 ENCOUNTER — Other Ambulatory Visit: Payer: Self-pay | Admitting: Family Medicine

## 2016-01-15 DIAGNOSIS — M1612 Unilateral primary osteoarthritis, left hip: Secondary | ICD-10-CM

## 2016-01-15 NOTE — Assessment & Plan Note (Signed)
history, exam, and independent review of radiographs consistent with arthritis of the hip.  Discussed tylenol, nsaids, glucosamine, topical medications.  She would like to try intraarticular cortisone injection - will set this up.

## 2016-01-15 NOTE — Assessment & Plan Note (Signed)
known DJD confirmed on independent review of today's radiographs.  Previously discussed tylenol, nsaids, glucosamine, topical medications.  Repeat injection without much benefit.  Will get approval for viscosupplementation.

## 2016-01-15 NOTE — Assessment & Plan Note (Signed)
Independently reviewed radiographs and no arthritis seen.  Exam would fit with sinus tarsi syndrome but she hasn't improved with arch support, intermittent injections.  Will go ahead with MRI to assess for OCD or other abnormalities to account for her pain.

## 2016-01-15 NOTE — Progress Notes (Addendum)
PCP and consultation requested by: Ann Held, DO  Subjective:   HPI: Patient is a 65 y.o. female here for left foot/ankle pain.  12/5: Patient does not recall an injury. She reports having almost 6 months of lateral anterior left ankle pain. Pain level 4/10, constant and sharp. Worse when on feet a lot. No swelling or bruising. No skin changes, fever, other complaints.  06/03/15: Patient reports pain worsened since last visit. Pain level 8/10, sharp lateral ankle and anteriorly. Swelling comes and goes. Worse by end of day. No skin cahnges, fever, other complaints.  4/12: Patient reports she did well with injection for left ankle but pain started coming back past 1-2 weeks. Pain is 2/10, sharp anterior left ankle. Using arch supports. Some slight swelling. Worse with ambulation. 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 No locking, giving out. No skin changes, numbness.  6/14: Patient reports over past few weeks pain has returned in left ankle and right knee. Pain level 3/10 in knee, 5/10 in ankle. Pain in knee is anteromedial, worse with walking and can be sharp. Pain in ankle is still lateral, sharp. Also worse with ambulation. No catching, locking of knee or ankle. Will feel like left heel is going to give out. No skin changes, numbness.  8/24: Patient returns for injections for left ankle and right knee. Pain level 9/10 in ankle, 7/10 in right knee.  9/13: Patient reports only transient improvement from left ankle and right knee injections this time. Pain in both is 4/10. Also with left hip pain now also. Pain in hip is 9/10, sharp. Has to sit in car for 10-15 minutes before she gets out. No numbness or tingling. Pain in hip is anterior, lateral. No skin changes.  Past Medical History:  Diagnosis Date  . Aortic stenosis, mild 01/08/2015  . Cancer ([place]) lung    lung  . Hx of cancer of lung 6/10   Removed June 2010   . Hyperlipidemia   . Hypertension   . Low back pain   . Morbid obesity ([place]) 01/08/2015  . Vitamin D deficiency     Current Outpatient Prescriptions on File Prior to Visit  Medication Sig Dispense Refill  . Acetaminophen (TYLENOL 8 HOUR PO) Take by mouth daily. TAKE THREE TABLETS WITH BID EACH MEAL.    Marland Kitchen aspirin 81 MG tablet Take 81 mg by mouth 3 (three) times daily.     . Cholecalciferol 4000 UNITS CAPS Take 4,000 capsules by mouth daily.    . clonazePAM (KLONOPIN) 0.5 MG tablet TAKE 1 TABLET BY MOUTH TWICE A DAY AS NEEDED FOR ANXIETY 60 tablet 1  . furosemide (LASIX) 20 MG tablet Take 1 tablet (20 mg total) by mouth daily. 30 tablet 3  . Homeopathic Products ([employer] LEG CRAMPS PAIN RELIEF) TABS Take by mouth as needed. [employer]    . olmesartan-hydrochlorothiazide (BENICAR HCT) 40-12.5 MG tablet Take 1 tablet by mouth daily. 90 tablet 3  . sertraline (ZOLOFT) 100 MG tablet TAKE 1 TABLET (100 MG TOTAL) BY MOUTH DAILY. 90 tablet 3  . Vitamin D, Ergocalciferol, (DRISDOL) 50000 units CAPS capsule Take 1 capsule (50,000 Units total) by mouth every 7 (seven) days. 12 capsule 5   No current facility-administered medications on file prior to visit.     Past Surgical History:  Procedure Laterality Date  . CHOLECYSTECTOMY    . LUNG REMOVAL, PARTIAL    . TUBAL LIGATION      Allergies  Allergen Reactions  . Penicillins Other (See Comments)    Fever     Social History   Social History  . Marital status: Divorced    Spouse name: N/A  . Number of children: 1  . Years of education: N/A   Occupational History  . [employer] History Main Topics  . Smoking status: Former Smoker    Types: Cigarettes    Quit date: 02/01/2005  . Smokeless tobacco: Never Used  . Alcohol use No  . Drug use: No  . Sexual activity: Yes   Other Topics Concern  . Not on file   Social History Narrative   Exercise--  no    Family History  Problem Relation Age  of Onset  . Hypertension Mother   . Stroke Mother   . Cancer Mother     lung  . Heart disease Father     cabg  . Cancer Brother     skin    BP 104/65   Pulse 72   Ht '5\' 4"'$  (1.626 m)   Wt 270 lb (122.5 kg)   BMI 46.35 kg/m   Review of Systems: See HPI above.    Objective:  Physical Exam:  Gen: NAD  Left foot/ankle: Pes planus. Mild diffuse swelling.  No other gross deformity, ecchymoses FROM with 5/5 strength all directions TTP over sinus tarsi, anterior ankle joint only. Negative ant drawer and talar tilt.   Negative syndesmotic compression. Thompsons test negative. NV intact distally.  Right foot/ankle: FROM without pain.    Right knee: No gross deformity, ecchymoses, effusion. TTP mildly medial and lateral joint lines. FROM. Negative ant/post drawers. Negative valgus/varus testing. Negative lachmanns. Negative mcmurrays, apleys, patellar apprehension. NV intact distally.  Left hip: No gross deformity, swelling, bruising. Mod limitation of motion especially internal rotation. No focal tenderness. Pain reproduced on passive motion of the hip.  Assessment & Plan:  1. Left ankle pain - Independently reviewed radiographs and no arthritis seen.  Exam would fit with sinus tarsi syndrome but she hasn't improved with arch support, intermittent injections.  Will go ahead with MRI to assess for OCD or other abnormalities to account for her pain.  2. Right knee pain - known DJD confirmed on independent review of today's radiographs.  Previously discussed tylenol, nsaids, glucosamine, topical medications.  Repeat injection without much benefit.  Will get approval for viscosupplementation.  3. Left hip pain - history, exam, and independent review of radiographs consistent with arthritis of the hip.  Discussed tylenol, nsaids, glucosamine, topical medications.  She would like to try intraarticular cortisone injection - will set this up.  Addendum:  MRI reviewed and  discussed with patient.  Reassuring of the ankle - discussed orthotics - she just started using new shoes with good arch support - will continue with this for now.  She is getting hip injection Thursday, awaiting insurance approval for gel shots.

## 2016-01-17 ENCOUNTER — Ambulatory Visit (HOSPITAL_BASED_OUTPATIENT_CLINIC_OR_DEPARTMENT_OTHER)
Admission: RE | Admit: 2016-01-17 | Discharge: 2016-01-17 | Disposition: A | Discharge status: Home / Self Care | Payer: BC Managed Care – PPO | Source: Ambulatory Visit | Attending: Family Medicine | Admitting: Family Medicine

## 2016-01-17 DIAGNOSIS — M25572 Pain in left ankle and joints of left foot: Secondary | ICD-10-CM | POA: Insufficient documentation

## 2016-01-17 DIAGNOSIS — M25872 Other specified joint disorders, left ankle and foot: Secondary | ICD-10-CM | POA: Diagnosis not present

## 2016-01-17 DIAGNOSIS — M13872 Other specified arthritis, left ankle and foot: Secondary | ICD-10-CM | POA: Diagnosis not present

## 2016-01-17 DIAGNOSIS — M67874 Other specified disorders of tendon, left ankle and foot: Secondary | ICD-10-CM | POA: Diagnosis not present

## 2016-01-17 DIAGNOSIS — M25472 Effusion, left ankle: Secondary | ICD-10-CM | POA: Diagnosis not present

## 2016-01-22 ENCOUNTER — Ambulatory Visit
Admission: RE | Admit: 2016-01-22 | Discharge: 2016-01-22 | Disposition: A | Discharge status: Home / Self Care | Payer: Medicare Other | Source: Ambulatory Visit | Attending: Family Medicine | Admitting: Family Medicine

## 2016-01-22 DIAGNOSIS — M1612 Unilateral primary osteoarthritis, left hip: Secondary | ICD-10-CM

## 2016-01-22 MED ORDER — IOPAMIDOL (ISOVUE-M 200) INJECTION 41%
1.0000 mL | Freq: Once | INTRAMUSCULAR | Status: AC
  Administered 2016-01-22: 1 mL via INTRA_ARTICULAR

## 2016-01-22 MED ORDER — METHYLPREDNISOLONE ACETATE 40 MG/ML INJ SUSP (RADIOLOG
120.0000 mg | Freq: Once | INTRAMUSCULAR | Status: AC
  Administered 2016-01-22: 120 mg via INTRA_ARTICULAR

## 2016-01-29 ENCOUNTER — Telehealth: Payer: Self-pay | Admitting: Family Medicine

## 2016-01-29 ENCOUNTER — Ambulatory Visit (INDEPENDENT_AMBULATORY_CARE_PROVIDER_SITE_OTHER): Payer: BC Managed Care – PPO | Admitting: Family Medicine

## 2016-01-29 ENCOUNTER — Encounter: Payer: Self-pay | Admitting: Family Medicine

## 2016-01-29 VITALS — BP 124/70 | HR 64 | Ht 64.0 in | Wt 270.0 lb

## 2016-01-29 DIAGNOSIS — M1711 Unilateral primary osteoarthritis, right knee: Secondary | ICD-10-CM

## 2016-01-29 DIAGNOSIS — M129 Arthropathy, unspecified: Secondary | ICD-10-CM

## 2016-01-29 MED ORDER — SODIUM HYALURONATE (VISCOSUP) 25 MG/2.5ML IX SOSY
2.5000 mL | PREFILLED_SYRINGE | Freq: Once | INTRA_ARTICULAR | Status: AC
  Administered 2016-01-29: 2.5 mL via INTRA_ARTICULAR

## 2016-01-29 NOTE — Telephone Encounter (Signed)
Patient dropped off application for renewal of disability parking placard, patient will pick up when ready. Documents placed in tray at front ofc

## 2016-01-30 DIAGNOSIS — M1711 Unilateral primary osteoarthritis, right knee: Secondary | ICD-10-CM | POA: Insufficient documentation

## 2016-01-30 NOTE — Assessment & Plan Note (Signed)
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  Previously discussed tylenol, nsaids, glucosamine, topical medications.  Supartz series started today.  S/p cortisone injection without much benefit.  F/u in 1 week.  After informed written consent, patient was seated on exam table. Right knee was prepped with alcohol swab and utilizing anteromedial approach, patient's right knee was injected intraarticularly with 34m marcaine followed by sJacklyn Shell Patient tolerated the procedure well without immediate complications.

## 2016-01-30 NOTE — Progress Notes (Signed)
PCP and consultation requested by: Ann Held, DO  Subjective:   HPI: Patient is a 65 y.o. female here for left foot/ankle pain.  12/5: Patient does not recall an injury. She reports having almost 6 months of lateral anterior left ankle pain. Pain level 4/10, constant and sharp. Worse when on feet a lot. No swelling or bruising. No skin changes, fever, other complaints.  06/03/15: Patient reports pain worsened since last visit. Pain level 8/10, sharp lateral ankle and anteriorly. Swelling comes and goes. Worse by end of day. No skin cahnges, fever, other complaints.  4/12: Patient reports she did well with injection for left ankle but pain started coming back past 1-2 weeks. Pain is 2/10, sharp anterior left ankle. Using arch supports. Some slight swelling. Worse with ambulation. 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 No locking, giving out. No skin changes, numbness.  6/14: Patient reports over past few weeks pain has returned in left ankle and right knee. Pain level 3/10 in knee, 5/10 in ankle. Pain in knee is anteromedial, worse with walking and can be sharp. Pain in ankle is still lateral, sharp. Also worse with ambulation. No catching, locking of knee or ankle. Will feel like left heel is going to give out. No skin changes, numbness.  8/24: Patient returns for injections for left ankle and right knee. Pain level 9/10 in ankle, 7/10 in right knee.  9/13: Patient reports only transient improvement from left ankle and right knee injections this time. Pain in both is 4/10. Also with left hip pain now also. Pain in hip is 9/10, sharp. Has to sit in car for 10-15 minutes before she gets out. No numbness or tingling. Pain in hip is anterior, lateral. No skin changes.  9/28: Patient returns to start supartz series for right knee. Pain 5/10. Intraarticular hip injection has helped her.  Past Medical History:   Diagnosis Date  . Aortic stenosis, mild 01/08/2015  . Cancer ([place]) lung    lung  . Hx of cancer of lung 6/10   Removed June 2010  . Hyperlipidemia   . Hypertension   . Low back pain   . Morbid obesity ([place]) 01/08/2015  . Vitamin D deficiency     Current Outpatient Prescriptions on File Prior to Visit  Medication Sig Dispense Refill  . Acetaminophen (TYLENOL 8 HOUR PO) Take by mouth daily. TAKE THREE TABLETS WITH BID EACH MEAL.    Marland Kitchen aspirin 81 MG tablet Take 81 mg by mouth 3 (three) times daily.     . Cholecalciferol 4000 UNITS CAPS Take 4,000 capsules by mouth daily.    . clonazePAM (KLONOPIN) 0.5 MG tablet TAKE 1 TABLET BY MOUTH TWICE A DAY AS NEEDED FOR ANXIETY 60 tablet 1  . furosemide (LASIX) 20 MG tablet Take 1 tablet (20 mg total) by mouth daily. 30 tablet 3  . Homeopathic Products ([employer] LEG CRAMPS PAIN RELIEF) TABS Take by mouth as needed. [employer]    . olmesartan-hydrochlorothiazide (BENICAR HCT) 40-12.5 MG tablet Take 1 tablet by mouth daily. 90 tablet 3  . sertraline (ZOLOFT) 100 MG tablet TAKE 1 TABLET (100 MG TOTAL) BY MOUTH DAILY. 90 tablet 3  . Vitamin D, Ergocalciferol, (DRISDOL) 50000 units CAPS capsule Take 1 capsule (50,000 Units total) by mouth every 7 (seven) days. 12 capsule 5   No current facility-administered medications on file prior to visit.     Past Surgical History:  Procedure Laterality Date  . CHOLECYSTECTOMY    .  LUNG REMOVAL, PARTIAL    . TUBAL LIGATION      Allergies  Allergen Reactions  . Penicillins Other (See Comments)    Fever     Social History   Social History  . Marital status: Divorced    Spouse name: N/A  . Number of children: 1  . Years of education: N/A   Occupational History  . [employer] History Main Topics  . Smoking status: Former Smoker    Types: Cigarettes    Quit date: 02/01/2005  . Smokeless tobacco: Never Used  . Alcohol use No  . Drug use: No  . Sexual activity:  Yes   Other Topics Concern  . Not on file   Social History Narrative   Exercise--  no    Family History  Problem Relation Age of Onset  . Hypertension Mother   . Stroke Mother   . Cancer Mother     lung  . Heart disease Father     cabg  . Cancer Brother     skin    BP 124/70   Pulse 64   Ht '5\' 4"'$  (1.626 m)   Wt 270 lb (122.5 kg)   BMI 46.35 kg/m   Review of Systems: See HPI above.    Objective:  Physical Exam:  Gen: NAD  Left foot/ankle: Pes planus. Mild diffuse swelling.  No other gross deformity, ecchymoses FROM with 5/5 strength all directions TTP over sinus tarsi, anterior ankle joint only. Negative ant drawer and talar tilt.   Negative syndesmotic compression. Thompsons test negative. NV intact distally.  Right foot/ankle: FROM without pain.    Right knee: No gross deformity, ecchymoses, effusion. TTP mildly medial and lateral joint lines. FROM. Negative ant/post drawers. Negative valgus/varus testing. Negative lachmanns. Negative mcmurrays, apleys, patellar apprehension. NV intact distally.  Left hip: No gross deformity, swelling, bruising. Mod limitation of motion especially internal rotation. No focal tenderness. Pain reproduced on passive motion of the hip.  Assessment & Plan:  1. 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  Previously discussed tylenol, nsaids, glucosamine, topical medications.  Supartz series started today.  S/p cortisone injection without much benefit.  F/u in 1 week.  After informed written consent, patient was seated on exam table. Right knee was prepped with alcohol swab and utilizing anteromedial approach, patient's right knee was injected intraarticularly with 26m marcaine followed by sJacklyn Shell Patient tolerated the procedure well without immediate complications.

## 2016-02-04 ENCOUNTER — Ambulatory Visit (INDEPENDENT_AMBULATORY_CARE_PROVIDER_SITE_OTHER): Payer: BC Managed Care – PPO | Admitting: Family Medicine

## 2016-02-04 ENCOUNTER — Ambulatory Visit: Payer: BC Managed Care – PPO | Admitting: Family Medicine

## 2016-02-04 ENCOUNTER — Other Ambulatory Visit: Payer: Self-pay | Admitting: Family Medicine

## 2016-02-04 ENCOUNTER — Encounter: Payer: Self-pay | Admitting: Family Medicine

## 2016-02-04 VITALS — BP 128/73 | HR 69 | Ht 64.0 in | Wt 270.0 lb

## 2016-02-04 DIAGNOSIS — M1711 Unilateral primary osteoarthritis, right knee: Secondary | ICD-10-CM

## 2016-02-04 DIAGNOSIS — I10 Essential (primary) hypertension: Secondary | ICD-10-CM

## 2016-02-04 MED ORDER — SODIUM HYALURONATE (VISCOSUP) 25 MG/2.5ML IX SOSY
2.5000 mL | PREFILLED_SYRINGE | Freq: Once | INTRA_ARTICULAR | Status: AC
  Administered 2016-02-04: 2.5 mL via INTRA_ARTICULAR

## 2016-02-05 NOTE — Assessment & Plan Note (Signed)
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  Previously discussed tylenol, nsaids, glucosamine, topical medications.  Second supartz injection given today.  S/p cortisone injection without much benefit.  F/u in 1 week.  After informed written consent, patient was seated on exam table. Right knee was prepped with alcohol swab and utilizing anteromedial approach, patient's right knee was injected intraarticularly with 48m marcaine followed by sJacklyn Shell Patient tolerated the procedure well without immediate complications.

## 2016-02-05 NOTE — Progress Notes (Signed)
PCP and consultation requested by: Ann Held, DO  Subjective:   HPI: Patient is a 65 y.o. female here for left foot/ankle pain.  12/5: Patient does not recall an injury. She reports having almost 6 months of lateral anterior left ankle pain. Pain level 4/10, constant and sharp. Worse when on feet a lot. No swelling or bruising. No skin changes, fever, other complaints.  06/03/15: Patient reports pain worsened since last visit. Pain level 8/10, sharp lateral ankle and anteriorly. Swelling comes and goes. Worse by end of day. No skin cahnges, fever, other complaints.  4/12: Patient reports she did well with injection for left ankle but pain started coming back past 1-2 weeks. Pain is 2/10, sharp anterior left ankle. Using arch supports. Some slight swelling. Worse with ambulation. 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 No locking, giving out. No skin changes, numbness.  6/14: Patient reports over past few weeks pain has returned in left ankle and right knee. Pain level 3/10 in knee, 5/10 in ankle. Pain in knee is anteromedial, worse with walking and can be sharp. Pain in ankle is still lateral, sharp. Also worse with ambulation. No catching, locking of knee or ankle. Will feel like left heel is going to give out. No skin changes, numbness.  8/24: Patient returns for injections for left ankle and right knee. Pain level 9/10 in ankle, 7/10 in right knee.  9/13: Patient reports only transient improvement from left ankle and right knee injections this time. Pain in both is 4/10. Also with left hip pain now also. Pain in hip is 9/10, sharp. Has to sit in car for 10-15 minutes before she gets out. No numbness or tingling. Pain in hip is anterior, lateral. No skin changes.  9/28: Patient returns to start supartz series for right knee. Pain 5/10. Intraarticular hip injection has helped her.  10/4: Patient reports she feels  about the same. Pain is 6/10 today.  Past Medical History:  Diagnosis Date  . Aortic stenosis, mild 01/08/2015  . Cancer ([place]) lung    lung  . Hx of cancer of lung 6/10   Removed June 2010  . Hyperlipidemia   . Hypertension   . Low back pain   . Morbid obesity ([place]) 01/08/2015  . Vitamin D deficiency     Current Outpatient Prescriptions on File Prior to Visit  Medication Sig Dispense Refill  . Acetaminophen (TYLENOL 8 HOUR PO) Take by mouth daily. TAKE THREE TABLETS WITH BID EACH MEAL.    Marland Kitchen aspirin 81 MG tablet Take 81 mg by mouth 3 (three) times daily.     . Cholecalciferol 4000 UNITS CAPS Take 4,000 capsules by mouth daily.    . clonazePAM (KLONOPIN) 0.5 MG tablet TAKE 1 TABLET BY MOUTH TWICE A DAY AS NEEDED FOR ANXIETY 60 tablet 1  . furosemide (LASIX) 20 MG tablet Take 1 tablet (20 mg total) by mouth daily. 30 tablet 3  . Homeopathic Products ([employer] LEG CRAMPS PAIN RELIEF) TABS Take by mouth as needed. [employer]    . olmesartan-hydrochlorothiazide (BENICAR HCT) 40-12.5 MG tablet Take 1 tablet by mouth daily. 90 tablet 3  . olmesartan-hydrochlorothiazide (BENICAR HCT) 40-12.5 MG tablet TAKE 1 TABLET BY MOUTH DAILY. 30 tablet 5  . sertraline (ZOLOFT) 100 MG tablet TAKE 1 TABLET (100 MG TOTAL) BY MOUTH DAILY. 90 tablet 3  . Vitamin D, Ergocalciferol, (DRISDOL) 50000 units CAPS capsule Take 1 capsule (50,000 Units total) by mouth every 7 (  seven) days. 12 capsule 5   No current facility-administered medications on file prior to visit.     Past Surgical History:  Procedure Laterality Date  . CHOLECYSTECTOMY    . LUNG REMOVAL, PARTIAL    . TUBAL LIGATION      Allergies  Allergen Reactions  . Penicillins Other (See Comments)    Fever     Social History   Social History  . Marital status: Divorced    Spouse name: N/A  . Number of children: 1  . Years of education: N/A   Occupational History  . [employer] History Main Topics   . Smoking status: Former Smoker    Types: Cigarettes    Quit date: 02/01/2005  . Smokeless tobacco: Never Used  . Alcohol use No  . Drug use: No  . Sexual activity: Yes   Other Topics Concern  . Not on file   Social History Narrative   Exercise--  no    Family History  Problem Relation Age of Onset  . Hypertension Mother   . Stroke Mother   . Cancer Mother     lung  . Heart disease Father     cabg  . Cancer Brother     skin    BP 128/73   Pulse 69   Ht '5\' 4"'$  (1.626 m)   Wt 270 lb (122.5 kg)   BMI 46.35 kg/m   Review of Systems: See HPI above.    Objective:  Physical Exam:  Gen: NAD Exam not repeated today.  Left foot/ankle: Pes planus. Mild diffuse swelling.  No other gross deformity, ecchymoses FROM with 5/5 strength all directions TTP over sinus tarsi, anterior ankle joint only. Negative ant drawer and talar tilt.   Negative syndesmotic compression. Thompsons test negative. NV intact distally.  Right foot/ankle: FROM without pain.    Right knee: No gross deformity, ecchymoses, effusion. TTP mildly medial and lateral joint lines. FROM. Negative ant/post drawers. Negative valgus/varus testing. Negative lachmanns. Negative mcmurrays, apleys, patellar apprehension. NV intact distally.  Left hip: No gross deformity, swelling, bruising. Mod limitation of motion especially internal rotation. No focal tenderness. Pain reproduced on passive motion of the hip.  Assessment & Plan:  1. 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  Previously discussed tylenol, nsaids, glucosamine, topical medications.  Second supartz injection given today.  S/p cortisone injection without much benefit.  F/u in 1 week.  After informed written consent, patient was seated on exam table. Right knee was prepped with alcohol swab and utilizing anteromedial approach, patient's right knee was injected intraarticularly with 79m marcaine followed by sJacklyn Shell Patient  tolerated the procedure well without immediate complications.

## 2016-02-07 ENCOUNTER — Other Ambulatory Visit: Payer: Self-pay | Admitting: Family Medicine

## 2016-02-12 ENCOUNTER — Other Ambulatory Visit: Payer: Self-pay | Admitting: Family Medicine

## 2016-02-12 ENCOUNTER — Ambulatory Visit (INDEPENDENT_AMBULATORY_CARE_PROVIDER_SITE_OTHER): Payer: BC Managed Care – PPO | Admitting: Family Medicine

## 2016-02-12 VITALS — BP 115/67 | HR 70 | Ht 64.0 in | Wt 270.0 lb

## 2016-02-12 DIAGNOSIS — M1711 Unilateral primary osteoarthritis, right knee: Secondary | ICD-10-CM

## 2016-02-13 ENCOUNTER — Encounter: Payer: Self-pay | Admitting: Family Medicine

## 2016-02-13 MED ORDER — SODIUM HYALURONATE (VISCOSUP) 25 MG/2.5ML IX SOSY
2.5000 mL | PREFILLED_SYRINGE | Freq: Once | INTRA_ARTICULAR | Status: AC
  Administered 2016-02-13: 2.5 mL via INTRA_ARTICULAR

## 2016-02-14 NOTE — Progress Notes (Signed)
PCP and consultation requested by: Elizabeth Held, DO  Subjective:   HPI: Patient is a 65 y.o. female here for left foot/ankle pain.  12/5: Patient does not recall an injury. She reports having almost 6 months of lateral anterior left ankle pain. Pain level 4/10, constant and sharp. Worse when on feet a lot. No swelling or bruising. No skin changes, fever, other complaints.  06/03/15: Patient reports pain worsened since last visit. Pain level 8/10, sharp lateral ankle and anteriorly. Swelling comes and goes. Worse by end of day. No skin cahnges, fever, other complaints.  4/12: Patient reports she did well with injection for left ankle but pain started coming back past 1-2 weeks. Pain is 2/10, sharp anterior left ankle. Using arch supports. Some slight swelling. Worse with ambulation. 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 No locking, giving out. No skin changes, numbness.  6/14: Patient reports over past few weeks pain has returned in left ankle and right knee. Pain level 3/10 in knee, 5/10 in ankle. Pain in knee is anteromedial, worse with walking and can be sharp. Pain in ankle is still lateral, sharp. Also worse with ambulation. No catching, locking of knee or ankle. Will feel like left heel is going to give out. No skin changes, numbness.  8/24: Patient returns for injections for left ankle and right knee. Pain level 9/10 in ankle, 7/10 in right knee.  9/13: Patient reports only transient improvement from left ankle and right knee injections this time. Pain in both is 4/10. Also with left hip pain now also. Pain in hip is 9/10, sharp. Has to sit in car for 10-15 minutes before she gets out. No numbness or tingling. Pain in hip is anterior, lateral. No skin changes.  9/28: Patient returns to start supartz series for right knee. Pain 5/10. Intraarticular hip injection has helped her.  10/4: Patient reports she feels  about the same. Pain is 6/10 today.  10/12: Patient returns with 3/10 level of pain.  Past Medical History:  Diagnosis Date  . Aortic stenosis, mild 01/08/2015  . Cancer ([place]) lung    lung  . Hx of cancer of lung 6/10   Removed June 2010  . Hyperlipidemia   . Hypertension   . Low back pain   . Morbid obesity ([place]) 01/08/2015  . Vitamin D deficiency     Current Outpatient Prescriptions on File Prior to Visit  Medication Sig Dispense Refill  . Acetaminophen (TYLENOL 8 HOUR PO) Take by mouth daily. TAKE THREE TABLETS WITH BID EACH MEAL.    Marland Kitchen aspirin 81 MG tablet Take 81 mg by mouth 3 (three) times daily.     . Cholecalciferol 4000 UNITS CAPS Take 4,000 capsules by mouth daily.    . furosemide (LASIX) 20 MG tablet Take 1 tablet (20 mg total) by mouth daily. 30 tablet 3  . Homeopathic Products ([employer] LEG CRAMPS PAIN RELIEF) TABS Take by mouth as needed. [employer]    . olmesartan-hydrochlorothiazide (BENICAR HCT) 40-12.5 MG tablet Take 1 tablet by mouth daily. 90 tablet 3  . olmesartan-hydrochlorothiazide (BENICAR HCT) 40-12.5 MG tablet TAKE 1 TABLET BY MOUTH DAILY. 30 tablet 5  . sertraline (ZOLOFT) 100 MG tablet TAKE 1 TABLET (100 MG TOTAL) BY MOUTH DAILY. 90 tablet 3  . Vitamin D, Ergocalciferol, (DRISDOL) 50000 units CAPS capsule Take 1 capsule (50,000 Units total) by mouth every 7 (seven) days. 12 capsule 5   No current facility-administered medications on file  prior to visit.     Past Surgical History:  Procedure Laterality Date  . CHOLECYSTECTOMY    . LUNG REMOVAL, PARTIAL    . TUBAL LIGATION      Allergies  Allergen Reactions  . Penicillins Other (See Comments)    Fever     Social History   Social History  . Marital status: Divorced    Spouse name: N/A  . Number of children: 1  . Years of education: N/A   Occupational History  . [employer] History Main Topics  . Smoking status: Former Smoker    Types: Cigarettes     Quit date: 02/01/2005  . Smokeless tobacco: Never Used  . Alcohol use No  . Drug use: No  . Sexual activity: Yes   Other Topics Concern  . Not on file   Social History Narrative   Exercise--  no    Family History  Problem Relation Age of Onset  . Hypertension Mother   . Stroke Mother   . Cancer Mother     lung  . Heart disease Father     cabg  . Cancer Brother     skin    BP 115/67   Pulse 70   Ht '5\' 4"'$  (1.626 m)   Wt 270 lb (122.5 kg)   BMI 46.35 kg/m   Review of Systems: See HPI above.    Objective:  Physical Exam:  Gen: NAD Exam not repeated today.  Left foot/ankle: Pes planus. Mild diffuse swelling.  No other gross deformity, ecchymoses FROM with 5/5 strength all directions TTP over sinus tarsi, anterior ankle joint only. Negative ant drawer and talar tilt.   Negative syndesmotic compression. Thompsons test negative. NV intact distally.  Right foot/ankle: FROM without pain.    Right knee: No gross deformity, ecchymoses, effusion. TTP mildly medial and lateral joint lines. FROM. Negative ant/post drawers. Negative valgus/varus testing. Negative lachmanns. Negative mcmurrays, apleys, patellar apprehension. NV intact distally.  Left hip: No gross deformity, swelling, bruising. Mod limitation of motion especially internal rotation. No focal tenderness. Pain reproduced on passive motion of the hip.  Assessment & Plan:  1. 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  Previously discussed tylenol, nsaids, glucosamine, topical medications.  Third supartz injection given today.  S/p cortisone injection without much benefit.  Call us in 4 weeks to let us know how she's doing - consider 4th and 5th injections, ortho referral depending on her improvement.  After informed written consent, patient was seated on exam table. Right knee was prepped with alcohol swab and utilizing anteromedial approach, patient's right knee was injected  intraarticularly with 25m marcaine followed by sJacklyn Shell Patient tolerated the procedure well without immediate complications.

## 2016-02-14 NOTE — Assessment & Plan Note (Signed)
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  Previously discussed tylenol, nsaids, glucosamine, topical medications.  Third supartz injection given today.  S/p cortisone injection without much benefit.  Call us in 4 weeks to let us know how she's doing - consider 4th and 5th injections, ortho referral depending on her improvement.  After informed written consent, patient was seated on exam table. Right knee was prepped with alcohol swab and utilizing anteromedial approach, patient's right knee was injected intraarticularly with 46m marcaine followed by sJacklyn Shell Patient tolerated the procedure well without immediate complications.

## 2016-03-09 ENCOUNTER — Ambulatory Visit (INDEPENDENT_AMBULATORY_CARE_PROVIDER_SITE_OTHER): Payer: BC Managed Care – PPO

## 2016-03-09 DIAGNOSIS — Z23 Encounter for immunization: Secondary | ICD-10-CM | POA: Diagnosis not present

## 2016-03-10 ENCOUNTER — Other Ambulatory Visit: Payer: Self-pay | Admitting: Family Medicine

## 2016-03-10 DIAGNOSIS — R600 Localized edema: Secondary | ICD-10-CM

## 2016-03-21 ENCOUNTER — Encounter: Payer: Self-pay | Admitting: Family Medicine

## 2016-03-24 ENCOUNTER — Ambulatory Visit: Payer: Self-pay | Admitting: Family Medicine

## 2016-03-29 ENCOUNTER — Encounter: Payer: Self-pay | Admitting: Family Medicine

## 2016-03-29 ENCOUNTER — Ambulatory Visit (INDEPENDENT_AMBULATORY_CARE_PROVIDER_SITE_OTHER): Payer: BC Managed Care – PPO | Admitting: Family Medicine

## 2016-03-29 VITALS — BP 110/74 | HR 71 | Ht 64.0 in | Wt 275.0 lb

## 2016-03-29 DIAGNOSIS — M25552 Pain in left hip: Secondary | ICD-10-CM

## 2016-03-29 DIAGNOSIS — M1711 Unilateral primary osteoarthritis, right knee: Secondary | ICD-10-CM | POA: Diagnosis not present

## 2016-03-29 DIAGNOSIS — M25572 Pain in left ankle and joints of left foot: Secondary | ICD-10-CM | POA: Diagnosis not present

## 2016-03-29 MED ORDER — METHYLPREDNISOLONE ACETATE 40 MG/ML IJ SUSP
40.0000 mg | Freq: Once | INTRAMUSCULAR | Status: AC
  Administered 2016-03-29: 40 mg via INTRA_ARTICULAR

## 2016-03-29 NOTE — Patient Instructions (Addendum)
We will set up another cortisone shot for your left hip for 1-2 weeks from now. We will go ahead with 4th and 5th gel shots for your left knee. Cortisone injection today for the left ankle. I would strongly consider meeting with the orthopedic surgeon to get plugged in if and when you have joint replacement surgery (this doesn't mean you meet with them and have surgery right away). Continue with arch supports, comfortable shoes for your left ankle.

## 2016-03-30 NOTE — Progress Notes (Signed)
PCP and consultation requested by: Ann Held, DO  Subjective:   HPI: Patient is a 65 y.o. female here for left ankle, right knee, left hip pain  12/5: Patient does not recall an injury. She reports having almost 6 months of lateral anterior left ankle pain. Pain level 4/10, constant and sharp. Worse when on feet a lot. No swelling or bruising. No skin changes, fever, other complaints.  06/03/15: Patient reports pain worsened since last visit. Pain level 8/10, sharp lateral ankle and anteriorly. Swelling comes and goes. Worse by end of day. No skin cahnges, fever, other complaints.  4/12: Patient reports she did well with injection for left ankle but pain started coming back past 1-2 weeks. Pain is 2/10, sharp anterior left ankle. Using arch supports. Some slight swelling. Worse with ambulation. 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 No locking, giving out. No skin changes, numbness.  6/14: Patient reports over past few weeks pain has returned in left ankle and right knee. Pain level 3/10 in knee, 5/10 in ankle. Pain in knee is anteromedial, worse with walking and can be sharp. Pain in ankle is still lateral, sharp. Also worse with ambulation. No catching, locking of knee or ankle. Will feel like left heel is going to give out. No skin changes, numbness.  8/24: Patient returns for injections for left ankle and right knee. Pain level 9/10 in ankle, 7/10 in right knee.  9/13: Patient reports only transient improvement from left ankle and right knee injections this time. Pain in both is 4/10. Also with left hip pain now also. Pain in hip is 9/10, sharp. Has to sit in car for 10-15 minutes before she gets out. No numbness or tingling. Pain in hip is anterior, lateral. No skin changes.  9/28: Patient returns to start supartz series for right knee. Pain 5/10. Intraarticular hip injection has helped her.  10/4: Patient  reports she feels about the same. Pain is 6/10 today.  10/12: Patient returns with 3/10 level of pain.  11/27: Patient reports she continues to have problems with left ankle, right knee, left hip. Left hip injection helped until past 1-2 weeks and started hurting again. Pain in right knee is 7/10 level, sharp. Worse with stairs, bending the knee. Pain in left ankle anterolateral. Radiates across top of foot and to knee. Pain 9/10 and sharp. No skin changes, numbness.  Past Medical History:  Diagnosis Date  . Aortic stenosis, mild 01/08/2015  . Cancer ([place]) lung    lung  . Hx of cancer of lung 6/10   Removed June 2010  . Hyperlipidemia   . Hypertension   . Low back pain   . Morbid obesity ([place]) 01/08/2015  . Vitamin D deficiency     Current Outpatient Prescriptions on File Prior to Visit  Medication Sig Dispense Refill  . Acetaminophen (TYLENOL 8 HOUR PO) Take by mouth daily. TAKE THREE TABLETS WITH BID EACH MEAL.    Marland Kitchen aspirin 81 MG tablet Take 81 mg by mouth 3 (three) times daily.     . Cholecalciferol 4000 UNITS CAPS Take 4,000 capsules by mouth daily.    . clonazePAM (KLONOPIN) 0.5 MG tablet TAKE 1 TABLET BY MOUTH TWICE A DAY AS NEEDED FOR ANXIETY 60 tablet 1  . furosemide (LASIX) 20 MG tablet TAKE 1 TABLET (20 MG TOTAL) BY MOUTH DAILY. 30 tablet 5  . Homeopathic Products ([employer] LEG CRAMPS PAIN RELIEF) TABS Take by mouth as needed. [place]  [employer]    . olmesartan-hydrochlorothiazide (BENICAR HCT) 40-12.5 MG tablet Take 1 tablet by mouth daily. 90 tablet 3  . olmesartan-hydrochlorothiazide (BENICAR HCT) 40-12.5 MG tablet TAKE 1 TABLET BY MOUTH DAILY. 30 tablet 5  . sertraline (ZOLOFT) 100 MG tablet TAKE 1 TABLET (100 MG TOTAL) BY MOUTH DAILY. 90 tablet 3  . Vitamin D, Ergocalciferol, (DRISDOL) 50000 units CAPS capsule Take 1 capsule (50,000 Units total) by mouth every 7 (seven) days. 12 capsule 5   No current facility-administered medications on file prior to visit.     Past  Surgical History:  Procedure Laterality Date  . CHOLECYSTECTOMY    . LUNG REMOVAL, PARTIAL    . TUBAL LIGATION      Allergies  Allergen Reactions  . Penicillins Other (See Comments)    Fever     Social History   Social History  . Marital status: Divorced    Spouse name: N/A  . Number of children: 1  . Years of education: N/A   Occupational History  . [place] History Main Topics  . Smoking status: Former Smoker    Types: Cigarettes    Quit date: 02/01/2005  . Smokeless tobacco: Never Used  . Alcohol use No  . Drug use: No  . Sexual activity: Yes   Other Topics Concern  . Not on file   Social History Narrative   Exercise--  no    Family History  Problem Relation Age of Onset  . Hypertension Mother   . Stroke Mother   . Cancer Mother     lung  . Heart disease Father     cabg  . Cancer Brother     skin    BP 110/74   Pulse 71   Ht '5\' 4"'$  (1.626 m)   Wt 275 lb (124.7 kg)   BMI 47.20 kg/m   Review of Systems: See HPI above.    Objective:  Physical Exam:  Gen: NAD Exam not repeated today.  Left foot/ankle: Pes planus. Mild diffuse swelling.  No other gross deformity, ecchymoses FROM with 5/5 strength all directions TTP over sinus tarsi, anterior ankle joint only. Negative ant drawer and talar tilt.   Negative syndesmotic compression. Thompsons test negative. NV intact distally.  Right foot/ankle: FROM without pain.    Right knee: No gross deformity, ecchymoses, effusion. TTP mildly medial and lateral joint lines. FROM. Negative ant/post drawers. Negative valgus/varus testing. Negative lachmanns. Negative mcmurrays, apleys, patellar apprehension. NV intact distally.  Left hip: No gross deformity, swelling, bruising. Mod limitation of motion especially internal rotation. No focal tenderness. Positive logroll.  Assessment & Plan:  1. 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  S/p  cortisone injections and 3 supartz injections - she would like to try 4th and 5th injections.  Previously discussed tylenol, nsaids, glucosamine, topical medications.    2. 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  She tried orthotics but were uncomfortable.  She will try another injection which was given today.  After informed written consent, patient was seated on exam table. Right ankle joint identified by ultrasound laterally, was prepped with alcohol swab, and utilizing anterolateral approach, patient's right ankle was injected intraarticularly with 2:1 marcaine: depomedrol. Patient tolerated the procedure well without immediate complications.  3. Left hip pain - 2/2 DJD.  She would like to try another intraarticular injection.  We will set this up.  Also recommended referral to orthopedics to discuss hip  replacement - she would like to wait on this.

## 2016-03-31 ENCOUNTER — Other Ambulatory Visit: Payer: Self-pay | Admitting: Family Medicine

## 2016-03-31 DIAGNOSIS — M1612 Unilateral primary osteoarthritis, left hip: Secondary | ICD-10-CM

## 2016-03-31 NOTE — Assessment & Plan Note (Signed)
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  S/p cortisone injections and 3 supartz injections - she would like to try 4th and 5th injections.  Previously discussed tylenol, nsaids, glucosamine, topical medications.

## 2016-03-31 NOTE — Assessment & Plan Note (Signed)
2/2 DJD.  She would like to try another intraarticular injection.  We will set this up.  Also recommended referral to orthopedics to discuss hip replacement - she would like to wait on this.

## 2016-03-31 NOTE — Assessment & Plan Note (Signed)
2/2 sinus tarsi syndrome, arthropathy.  She tried orthotics but were uncomfortable.  She will try another injection which was given today.  After informed written consent, patient was seated on exam table. Right ankle joint identified by ultrasound laterally, was prepped with alcohol swab, and utilizing anterolateral approach, patient's right ankle was injected intraarticularly with 2:1 marcaine: depomedrol. Patient tolerated the procedure well without immediate complications.

## 2016-04-12 ENCOUNTER — Encounter: Payer: Self-pay | Admitting: Family Medicine

## 2016-04-12 ENCOUNTER — Ambulatory Visit
Admission: RE | Admit: 2016-04-12 | Discharge: 2016-04-12 | Disposition: A | Discharge status: Home / Self Care | Payer: BC Managed Care – PPO | Source: Ambulatory Visit | Attending: Family Medicine | Admitting: Family Medicine

## 2016-04-12 ENCOUNTER — Ambulatory Visit (INDEPENDENT_AMBULATORY_CARE_PROVIDER_SITE_OTHER): Payer: BC Managed Care – PPO | Admitting: Family Medicine

## 2016-04-12 VITALS — BP 139/79 | HR 80 | Ht 64.0 in | Wt 275.0 lb

## 2016-04-12 DIAGNOSIS — M1711 Unilateral primary osteoarthritis, right knee: Secondary | ICD-10-CM | POA: Diagnosis not present

## 2016-04-12 DIAGNOSIS — M1612 Unilateral primary osteoarthritis, left hip: Secondary | ICD-10-CM

## 2016-04-12 MED ORDER — METHYLPREDNISOLONE ACETATE 40 MG/ML INJ SUSP (RADIOLOG
120.0000 mg | Freq: Once | INTRAMUSCULAR | Status: AC
  Administered 2016-04-12: 120 mg via INTRA_ARTICULAR

## 2016-04-12 MED ORDER — SODIUM HYALURONATE (VISCOSUP) 25 MG/2.5ML IX SOSY
2.5000 mL | PREFILLED_SYRINGE | Freq: Once | INTRA_ARTICULAR | Status: AC
  Administered 2016-04-12: 2.5 mL via INTRA_ARTICULAR

## 2016-04-12 MED ORDER — IOPAMIDOL (ISOVUE-M 200) INJECTION 41%
1.0000 mL | Freq: Once | INTRAMUSCULAR | Status: AC
  Administered 2016-04-12: 1 mL via INTRA_ARTICULAR

## 2016-04-13 NOTE — Progress Notes (Signed)
PCP and consultation requested by: Ann Held, DO  Subjective:   HPI: Patient is a 65 y.o. female here for left ankle, right knee, left hip pain  12/5: Patient does not recall an injury. She reports having almost 6 months of lateral anterior left ankle pain. Pain level 4/10, constant and sharp. Worse when on feet a lot. No swelling or bruising. No skin changes, fever, other complaints.  06/03/15: Patient reports pain worsened since last visit. Pain level 8/10, sharp lateral ankle and anteriorly. Swelling comes and goes. Worse by end of day. No skin cahnges, fever, other complaints.  4/12: Patient reports she did well with injection for left ankle but pain started coming back past 1-2 weeks. Pain is 2/10, sharp anterior left ankle. Using arch supports. Some slight swelling. Worse with ambulation. 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 No locking, giving out. No skin changes, numbness.  6/14: Patient reports over past few weeks pain has returned in left ankle and right knee. Pain level 3/10 in knee, 5/10 in ankle. Pain in knee is anteromedial, worse with walking and can be sharp. Pain in ankle is still lateral, sharp. Also worse with ambulation. No catching, locking of knee or ankle. Will feel like left heel is going to give out. No skin changes, numbness.  8/24: Patient returns for injections for left ankle and right knee. Pain level 9/10 in ankle, 7/10 in right knee.  9/13: Patient reports only transient improvement from left ankle and right knee injections this time. Pain in both is 4/10. Also with left hip pain now also. Pain in hip is 9/10, sharp. Has to sit in car for 10-15 minutes before she gets out. No numbness or tingling. Pain in hip is anterior, lateral. No skin changes.  9/28: Patient returns to start supartz series for right knee. Pain 5/10. Intraarticular hip injection has helped her.  10/4: Patient  reports she feels about the same. Pain is 6/10 today.  10/12: Patient returns with 3/10 level of pain.  11/27: Patient reports she continues to have problems with left ankle, right knee, left hip. Left hip injection helped until past 1-2 weeks and started hurting again. Pain in right knee is 7/10 level, sharp. Worse with stairs, bending the knee. Pain in left ankle anterolateral. Radiates across top of foot and to knee. Pain 9/10 and sharp. No skin changes, numbness.  12/11: Patient returns for 4th supartz injection right knee. Cortisone injection given for left hip earlier today. Pain 4/10 level.  Past Medical History:  Diagnosis Date  . Aortic stenosis, mild 01/08/2015  . Cancer ([place]) lung    lung  . Hx of cancer of lung 6/10   Removed June 2010  . Hyperlipidemia   . Hypertension   . Low back pain   . Morbid obesity ([place]) 01/08/2015  . Vitamin D deficiency     Current Outpatient Prescriptions on File Prior to Visit  Medication Sig Dispense Refill  . Acetaminophen (TYLENOL 8 HOUR PO) Take by mouth daily. TAKE THREE TABLETS WITH BID EACH MEAL.    Marland Kitchen aspirin 81 MG tablet Take 81 mg by mouth 3 (three) times daily.     . Cholecalciferol 4000 UNITS CAPS Take 4,000 capsules by mouth daily.    . clonazePAM (KLONOPIN) 0.5 MG tablet TAKE 1 TABLET BY MOUTH TWICE A DAY AS NEEDED FOR ANXIETY 60 tablet 1  . furosemide (LASIX) 20 MG tablet TAKE 1 TABLET (20 MG TOTAL) BY  MOUTH DAILY. 30 tablet 5  . Homeopathic Products ([employer] LEG CRAMPS PAIN RELIEF) TABS Take by mouth as needed. [employer]    . olmesartan-hydrochlorothiazide (BENICAR HCT) 40-12.5 MG tablet TAKE 1 TABLET BY MOUTH DAILY. 30 tablet 5  . sertraline (ZOLOFT) 100 MG tablet TAKE 1 TABLET (100 MG TOTAL) BY MOUTH DAILY. 90 tablet 3  . Vitamin D, Ergocalciferol, (DRISDOL) 50000 units CAPS capsule Take 1 capsule (50,000 Units total) by mouth every 7 (seven) days. 12 capsule 5   No current facility-administered medications on file  prior to visit.     Past Surgical History:  Procedure Laterality Date  . CHOLECYSTECTOMY    . LUNG REMOVAL, PARTIAL    . TUBAL LIGATION      Allergies  Allergen Reactions  . Penicillins Other (See Comments)    Fever     Social History   Social History  . Marital status: Divorced    Spouse name: N/A  . Number of children: 1  . Years of education: N/A   Occupational History  . [place] History Main Topics  . Smoking status: Former Smoker    Types: Cigarettes    Quit date: 02/01/2005  . Smokeless tobacco: Never Used  . Alcohol use No  . Drug use: No  . Sexual activity: Yes   Other Topics Concern  . Not on file   Social History Narrative   Exercise--  no    Family History  Problem Relation Age of Onset  . Hypertension Mother   . Stroke Mother   . Cancer Mother     lung  . Heart disease Father     cabg  . Cancer Brother     skin    BP 139/79   Pulse 80   Ht '5\' 4"'$  (1.626 m)   Wt 275 lb (124.7 kg)   BMI 47.20 kg/m   Review of Systems: See HPI above.    Objective:  Physical Exam:  Gen: NAD Exam not repeated today.   Right knee: No gross deformity, ecchymoses, effusion. TTP mildly medial and lateral joint lines. FROM. Negative ant/post drawers. Negative valgus/varus testing. Negative lachmanns. Negative mcmurrays, apleys, patellar apprehension. NV intact distally.  Assessment & Plan:  1. 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  S/p cortisone injections and 3 supartz injections - went with 4th supartz injection today.  Previously discussed tylenol, nsaids, glucosamine, topical medications.    After informed written consent, patient was seated on exam table. Right knee was prepped with alcohol swab and utilizing anteromedial approach, patient's right knee was injected intraarticularly with 22m marcaine followed by sJacklyn Shell Patient tolerated the procedure well without immediate  complications.

## 2016-04-13 NOTE — Assessment & Plan Note (Addendum)
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  S/p cortisone injections and 3 supartz injections - went with 4th supartz injection today.  Previously discussed tylenol, nsaids, glucosamine, topical medications.    After informed written consent, patient was seated on exam table. Right knee was prepped with alcohol swab and utilizing anteromedial approach, patient's right knee was injected intraarticularly with 29m marcaine followed by sJacklyn Shell Patient tolerated the procedure well without immediate complications.

## 2016-04-19 ENCOUNTER — Ambulatory Visit (INDEPENDENT_AMBULATORY_CARE_PROVIDER_SITE_OTHER): Payer: BC Managed Care – PPO | Admitting: Family Medicine

## 2016-04-19 ENCOUNTER — Encounter: Payer: Self-pay | Admitting: Family Medicine

## 2016-04-19 VITALS — BP 121/77 | HR 59 | Ht 64.0 in | Wt 275.0 lb

## 2016-04-19 DIAGNOSIS — M1711 Unilateral primary osteoarthritis, right knee: Secondary | ICD-10-CM | POA: Diagnosis not present

## 2016-04-19 MED ORDER — SODIUM HYALURONATE (VISCOSUP) 25 MG/2.5ML IX SOSY
2.5000 mL | PREFILLED_SYRINGE | Freq: Once | INTRA_ARTICULAR | Status: AC
  Administered 2016-04-19: 2.5 mL via INTRA_ARTICULAR

## 2016-04-20 NOTE — Assessment & Plan Note (Signed)
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  S/p cortisone injections and 4 supartz injections - went with 5th supartz injection today.  Previously discussed tylenol, nsaids, glucosamine, topical medications.    After informed written consent, patient was seated on exam table. Right knee was prepped with alcohol swab and utilizing anteromedial approach, patient's right knee was injected intraarticularly with 27m bupivicaine followed by supartz. Patient tolerated the procedure well without immediate complications.

## 2016-04-20 NOTE — Progress Notes (Signed)
PCP and consultation requested by: Ann Held, DO  Subjective:   HPI: Patient is a 65 y.o. female here for left ankle, right knee, left hip pain  12/5: Patient does not recall an injury. She reports having almost 6 months of lateral anterior left ankle pain. Pain level 4/10, constant and sharp. Worse when on feet a lot. No swelling or bruising. No skin changes, fever, other complaints.  06/03/15: Patient reports pain worsened since last visit. Pain level 8/10, sharp lateral ankle and anteriorly. Swelling comes and goes. Worse by end of day. No skin cahnges, fever, other complaints.  4/12: Patient reports she did well with injection for left ankle but pain started coming back past 1-2 weeks. Pain is 2/10, sharp anterior left ankle. Using arch supports. Some slight swelling. Worse with ambulation. 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 No locking, giving out. No skin changes, numbness.  6/14: Patient reports over past few weeks pain has returned in left ankle and right knee. Pain level 3/10 in knee, 5/10 in ankle. Pain in knee is anteromedial, worse with walking and can be sharp. Pain in ankle is still lateral, sharp. Also worse with ambulation. No catching, locking of knee or ankle. Will feel like left heel is going to give out. No skin changes, numbness.  8/24: Patient returns for injections for left ankle and right knee. Pain level 9/10 in ankle, 7/10 in right knee.  9/13: Patient reports only transient improvement from left ankle and right knee injections this time. Pain in both is 4/10. Also with left hip pain now also. Pain in hip is 9/10, sharp. Has to sit in car for 10-15 minutes before she gets out. No numbness or tingling. Pain in hip is anterior, lateral. No skin changes.  9/28: Patient returns to start supartz series for right knee. Pain 5/10. Intraarticular hip injection has helped her.  10/4: Patient  reports she feels about the same. Pain is 6/10 today.  10/12: Patient returns with 3/10 level of pain.  11/27: Patient reports she continues to have problems with left ankle, right knee, left hip. Left hip injection helped until past 1-2 weeks and started hurting again. Pain in right knee is 7/10 level, sharp. Worse with stairs, bending the knee. Pain in left ankle anterolateral. Radiates across top of foot and to knee. Pain 9/10 and sharp. No skin changes, numbness.  12/11: Patient returns for 4th supartz injection right knee. Cortisone injection given for left hip earlier today. Pain 4/10 level.  12/18: Patient returns for 5th supartz injection. Pain level 3/10.  Past Medical History:  Diagnosis Date  . Aortic stenosis, mild 01/08/2015  . Cancer ([place]) lung    lung  . Hx of cancer of lung 6/10   Removed June 2010  . Hyperlipidemia   . Hypertension   . Low back pain   . Morbid obesity ([place]) 01/08/2015  . Vitamin D deficiency     Current Outpatient Prescriptions on File Prior to Visit  Medication Sig Dispense Refill  . Acetaminophen (TYLENOL 8 HOUR PO) Take by mouth daily. TAKE THREE TABLETS WITH BID EACH MEAL.    Marland Kitchen aspirin 81 MG tablet Take 81 mg by mouth 3 (three) times daily.     . Cholecalciferol 4000 UNITS CAPS Take 4,000 capsules by mouth daily.    . clonazePAM (KLONOPIN) 0.5 MG tablet TAKE 1 TABLET BY MOUTH TWICE A DAY AS NEEDED FOR ANXIETY 60 tablet 1  . furosemide (  LASIX) 20 MG tablet TAKE 1 TABLET (20 MG TOTAL) BY MOUTH DAILY. 30 tablet 5  . Homeopathic Products ([employer] LEG CRAMPS PAIN RELIEF) TABS Take by mouth as needed. [employer]    . olmesartan-hydrochlorothiazide (BENICAR HCT) 40-12.5 MG tablet TAKE 1 TABLET BY MOUTH DAILY. 30 tablet 5  . sertraline (ZOLOFT) 100 MG tablet TAKE 1 TABLET (100 MG TOTAL) BY MOUTH DAILY. 90 tablet 3  . Vitamin D, Ergocalciferol, (DRISDOL) 50000 units CAPS capsule Take 1 capsule (50,000 Units total) by mouth every 7 (seven)  days. 12 capsule 5   No current facility-administered medications on file prior to visit.     Past Surgical History:  Procedure Laterality Date  . CHOLECYSTECTOMY    . LUNG REMOVAL, PARTIAL    . TUBAL LIGATION      Allergies  Allergen Reactions  . Penicillins Other (See Comments)    Fever     Social History   Social History  . Marital status: Divorced    Spouse name: N/A  . Number of children: 1  . Years of education: N/A   Occupational History  . [place] History Main Topics  . Smoking status: Former Smoker    Types: Cigarettes    Quit date: 02/01/2005  . Smokeless tobacco: Never Used  . Alcohol use No  . Drug use: No  . Sexual activity: Yes   Other Topics Concern  . Not on file   Social History Narrative   Exercise--  no    Family History  Problem Relation Age of Onset  . Hypertension Mother   . Stroke Mother   . Cancer Mother     lung  . Heart disease Father     cabg  . Cancer Brother     skin    BP 121/77   Pulse (!) 59   Ht '5\' 4"'$  (1.626 m)   Wt 275 lb (124.7 kg)   BMI 47.20 kg/m   Review of Systems: See HPI above.    Objective:  Physical Exam:  Gen: NAD Exam not repeated today.   Right knee: No gross deformity, ecchymoses, effusion. TTP mildly medial and lateral joint lines. FROM. Negative ant/post drawers. Negative valgus/varus testing. Negative lachmanns. Negative mcmurrays, apleys, patellar apprehension. NV intact distally.  Assessment & Plan:  1. 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  S/p cortisone injections and 4 supartz injections - went with 5th supartz injection today.  Previously discussed tylenol, nsaids, glucosamine, topical medications.    After informed written consent, patient was seated on exam table. Right knee was prepped with alcohol swab and utilizing anteromedial approach, patient's right knee was injected intraarticularly with 74m bupivicaine followed  by supartz. Patient tolerated the procedure well without immediate complications.

## 2016-05-03 ENCOUNTER — Other Ambulatory Visit: Payer: Self-pay | Admitting: Family Medicine

## 2016-05-04 ENCOUNTER — Other Ambulatory Visit: Payer: Self-pay

## 2016-05-04 MED ORDER — CLONAZEPAM 0.5 MG PO TABS: ORAL_TABLET | ORAL | 0 refills | Status: DC

## 2016-05-04 NOTE — Telephone Encounter (Signed)
Refill x1 

## 2016-05-04 NOTE — Telephone Encounter (Signed)
Received Rx Request Clonazepam 0.'5mg'$  tab (take 1 tab BID PRN for anxiety).  Last Rf: 02/13/2016 Last Ov: 12/08/2015 Next Ov: 06/09/2016 UDS: None  Forwarded to Provider for review, signature and approval.

## 2016-05-24 ENCOUNTER — Other Ambulatory Visit: Payer: Self-pay | Admitting: Adult Health

## 2016-05-24 ENCOUNTER — Telehealth: Payer: Self-pay | Admitting: Internal Medicine

## 2016-05-24 DIAGNOSIS — C3411 Malignant neoplasm of upper lobe, right bronchus or lung: Secondary | ICD-10-CM

## 2016-05-24 NOTE — Telephone Encounter (Signed)
Left message re June appointments. Schedule mailed. Central radiology will call patient re scan once order entered.

## 2016-06-17 ENCOUNTER — Ambulatory Visit: Payer: Self-pay | Admitting: Family Medicine

## 2016-06-18 ENCOUNTER — Encounter: Payer: Self-pay | Admitting: Family Medicine

## 2016-06-18 ENCOUNTER — Ambulatory Visit (INDEPENDENT_AMBULATORY_CARE_PROVIDER_SITE_OTHER): Payer: BC Managed Care – PPO | Admitting: Family Medicine

## 2016-06-18 VITALS — BP 118/70 | HR 70 | Temp 98.2°F | Resp 16 | Ht 64.0 in | Wt 285.0 lb

## 2016-06-18 DIAGNOSIS — H538 Other visual disturbances: Secondary | ICD-10-CM

## 2016-06-18 NOTE — Patient Instructions (Signed)
Chalazion Introduction A chalazion is a swelling or lump on the eyelid. It can affect the upper or lower eyelid. What are the causes? This condition may be caused by:  Long-lasting (chronic) inflammation of the eyelid glands.  A blocked oil gland in the eyelid. What are the signs or symptoms? Symptoms of this condition include:  A swelling on the eyelid. The swelling may spread to areas around the eye.  A hard lump on the eyelid. This lump may make it hard to see out of the eye. How is this diagnosed? This condition is diagnosed with an examination of the eye. How is this treated? This condition is treated by applying a warm compress to the eyelid. If the condition does not improve after two days, it may be treated with:  Surgery.  Medicine that is injected into the chalazion by a health care provider.  Medicine that is applied to the eye. Follow these instructions at home:  Do not touch the chalazion.  Do not try to remove the pus, such as by squeezing the chalazion or sticking it with a pin or needle.  Do not rub your eyes.  Wash your hands often. Dry your hands with a clean towel.  Keep your face, scalp, and eyebrows clean.  Avoid wearing eye makeup.  Apply a warm, moist compress to the eyelid 4-6 times a day for 10-15 minutes at a time. This will help to open any blocked glands and help to reduce redness and swelling.  Apply over-the-counter and prescription medicines only as told by your health care provider.  If the chalazion does not break open (rupture) on its own in a month, return to your health care provider.  Keep all follow-up appointments as told by your health care provider. This is important. Contact a health care provider if:  Your eyelid has not improved in 4 weeks.  Your eyelid is getting worse.  You have a fever.  The chalazion does not rupture on its own with home treatment in a month. Get help right away if:  You have pain in your  eye.  Your vision changes.  The chalazion becomes painful or red  The chalazion gets bigger. This information is not intended to replace advice given to you by your health care provider. Make sure you discuss any questions you have with your health care provider. Document Released: 04/16/2000 Document Revised: 09/25/2015 Document Reviewed: 08/12/2014  2017 [employer]

## 2016-06-18 NOTE — Progress Notes (Signed)
Subjective:  I acted as a Education administrator for Dr. Carollee Herter.  Elizabeth [name], Elizabeth [name]   Patient ID: Elizabeth [name], female    DOB: 1950-12-25, 66 y.o.   MRN: [id number]  Chief Complaint  Patient presents with  . Eye Problem    redness right eye for over 2 weeks.    HPI Patient is in today for redness in the right eye.  Has been red for about 2 weeks.  No pain, no irritation, and no itching.  No problem with vision at all.  Past Medical History:  Diagnosis Date  . Aortic stenosis, mild 01/08/2015  . Cancer ([place]) lung    lung  . Hx of cancer of lung 6/10   Removed June 2010  . Hyperlipidemia   . Hypertension   . Low back pain   . Morbid obesity ([place]) 01/08/2015  . Vitamin D deficiency     Past Surgical History:  Procedure Laterality Date  . CHOLECYSTECTOMY    . LUNG REMOVAL, PARTIAL    . TUBAL LIGATION      Family History  Problem Relation Age of Onset  . Hypertension Mother   . Stroke Mother   . Cancer Mother     lung  . Heart disease Father     cabg  . Cancer Brother     skin    Social History   Social History  . Marital status: Divorced    Spouse name: N/A  . Number of children: 1  . Years of education: N/A   Occupational History  . [place] History Main Topics  . Smoking status: Former Smoker    Types: Cigarettes    Quit date: 02/01/2005  . Smokeless tobacco: Never Used  . Alcohol use No  . Drug use: No  . Sexual activity: Yes   Other Topics Concern  . Not on file   Social History Narrative   Exercise--  no    Outpatient Medications Prior to Visit  Medication Sig Dispense Refill  . Acetaminophen (TYLENOL 8 HOUR PO) Take by mouth daily. TAKE THREE TABLETS WITH BID EACH MEAL.    Marland Kitchen aspirin 81 MG tablet Take 81 mg by mouth daily.     . Cholecalciferol 4000 UNITS CAPS Take 4,000 capsules by mouth daily.    . clonazePAM (KLONOPIN) 0.5 MG tablet TAKE 1 TABLET BY MOUTH TWICE A DAY AS NEEDED FOR ANXIETY 60 tablet 0  .  furosemide (LASIX) 20 MG tablet TAKE 1 TABLET (20 MG TOTAL) BY MOUTH DAILY. (Patient taking differently: Take 20 mg by mouth daily as needed. ) 30 tablet 5  . Homeopathic Products ([employer] LEG CRAMPS PAIN RELIEF) TABS Take by mouth as needed. [employer]    . olmesartan-hydrochlorothiazide (BENICAR HCT) 40-12.5 MG tablet TAKE 1 TABLET BY MOUTH DAILY. 30 tablet 5  . sertraline (ZOLOFT) 100 MG tablet TAKE 1 TABLET (100 MG TOTAL) BY MOUTH DAILY. 90 tablet 3  . Vitamin D, Ergocalciferol, (DRISDOL) 50000 units CAPS capsule Take 1 capsule (50,000 Units total) by mouth every 7 (seven) days. 12 capsule 5   No facility-administered medications prior to visit.     Allergies  Allergen Reactions  . Penicillins Other (See Comments)    Fever     Review of Systems  Constitutional: Negative for chills, fever and malaise/fatigue.  HENT: Negative for congestion and hearing loss.   Eyes: Positive for redness. Negative for discharge.  Respiratory: Negative for cough, sputum production and shortness of  breath.   Cardiovascular: Negative for chest pain, palpitations and leg swelling.  Gastrointestinal: Negative for abdominal pain, blood in stool, constipation, diarrhea, heartburn, nausea and vomiting.  Genitourinary: Negative for dysuria, frequency, hematuria and urgency.  Musculoskeletal: Negative for back pain, falls and myalgias.  Skin: Negative for rash.  Neurological: Negative for dizziness, sensory change, loss of consciousness, weakness and headaches.  Endo/Heme/Allergies: Negative for environmental allergies. Does not bruise/bleed easily.  Psychiatric/Behavioral: Negative for depression and suicidal ideas. The patient is not nervous/anxious and does not have insomnia.        Objective:    Physical Exam  Constitutional: She is oriented to person, place, and time. She appears well-developed and well-nourished. No distress.  HENT:  Head: Normocephalic and atraumatic.  Eyes: Conjunctivae are  normal.  chalazon on right eye  Neck: Normal range of motion. No thyromegaly present.  Cardiovascular: Normal rate and regular rhythm.   Pulmonary/Chest: Effort normal and breath sounds normal. She has no wheezes.  Abdominal: Soft. Bowel sounds are normal. There is no tenderness.  Musculoskeletal: Normal range of motion. She exhibits no edema or deformity.  Neurological: She is alert and oriented to person, place, and time.  Skin: Skin is warm and dry. She is not diaphoretic.  Psychiatric: She has a normal mood and affect.  Nursing note and vitals reviewed.   BP 118/70 (BP Location: Right Arm, Cuff Size: Large)   Pulse 70   Temp 98.2 F (36.8 C) (Oral)   Resp 16   Ht '5\' 4"'  (1.626 m)   Wt 285 lb (129.3 kg)   SpO2 97%   BMI 48.92 kg/m  Wt Readings from Last 3 Encounters:  06/23/16 285 lb (129.3 kg)  06/18/16 285 lb (129.3 kg)  04/19/16 275 lb (124.7 kg)     Lab Results  Component Value Date   WBC 7.1 12/08/2015   HGB 12.3 12/08/2015   HCT 37.0 12/08/2015   PLT 192.0 12/08/2015   GLUCOSE 88 12/08/2015   CHOL 141 12/08/2015   TRIG 141.0 12/08/2015   HDL 36.40 (L) 12/08/2015   LDLCALC 76 12/08/2015   ALT 17 12/08/2015   AST 19 12/08/2015   NA 140 12/08/2015   K 3.8 12/08/2015   CL 103 12/08/2015   CREATININE 0.83 12/08/2015   BUN 20 12/08/2015   CO2 30 12/08/2015   TSH 2.30 12/08/2015   INR 1.0 10/16/2008   HGBA1C 5.5 01/18/2015    Lab Results  Component Value Date   TSH 2.30 12/08/2015   Lab Results  Component Value Date   WBC 7.1 12/08/2015   HGB 12.3 12/08/2015   HCT 37.0 12/08/2015   MCV 91.6 12/08/2015   PLT 192.0 12/08/2015   Lab Results  Component Value Date   NA 140 12/08/2015   K 3.8 12/08/2015   CHLORIDE 104 10/16/2015   CO2 30 12/08/2015   GLUCOSE 88 12/08/2015   BUN 20 12/08/2015   CREATININE 0.83 12/08/2015   BILITOT 0.5 12/08/2015   ALKPHOS 55 12/08/2015   AST 19 12/08/2015   ALT 17 12/08/2015   PROT 7.8 12/08/2015   ALBUMIN 4.3  12/08/2015   CALCIUM 9.6 12/08/2015   ANIONGAP 5 10/16/2015   EGFR 67 (L) 10/16/2015   GFR 73.07 12/08/2015   Lab Results  Component Value Date   CHOL 141 12/08/2015   Lab Results  Component Value Date   HDL 36.40 (L) 12/08/2015   Lab Results  Component Value Date   LDLCALC 76 12/08/2015   Lab  Results  Component Value Date   TRIG 141.0 12/08/2015   Lab Results  Component Value Date   CHOLHDL 4 12/08/2015   Lab Results  Component Value Date   HGBA1C 5.5 01/18/2015       Assessment & Plan:   Problem List Items Addressed This Visit    None    Visit Diagnoses    Blurry vision    -  Primary   Relevant Orders   Ambulatory referral to Ophthalmology      I am having Ms. Awwad maintain her CVS LEG CRAMPS PAIN RELIEF, Cholecalciferol, aspirin, Acetaminophen (TYLENOL 8 HOUR PO), Vitamin D (Ergocalciferol), sertraline, olmesartan-hydrochlorothiazide, furosemide, and clonazePAM.  No orders of the defined types were placed in this encounter.   CMA served as Education administrator during this visit. History, Physical and Plan performed by medical provider. Documentation and orders reviewed and attested to.  Ann Held, DO

## 2016-06-18 NOTE — Progress Notes (Signed)
Pre visit review using our clinic review tool, if applicable. No additional management support is needed unless otherwise documented below in the visit note. 

## 2016-06-23 ENCOUNTER — Encounter: Payer: Self-pay | Admitting: Family Medicine

## 2016-06-23 ENCOUNTER — Ambulatory Visit (INDEPENDENT_AMBULATORY_CARE_PROVIDER_SITE_OTHER): Payer: BC Managed Care – PPO | Admitting: Family Medicine

## 2016-06-23 VITALS — BP 124/79 | HR 77 | Ht 64.0 in | Wt 285.0 lb

## 2016-06-23 DIAGNOSIS — M25572 Pain in left ankle and joints of left foot: Secondary | ICD-10-CM

## 2016-06-23 DIAGNOSIS — M1711 Unilateral primary osteoarthritis, right knee: Secondary | ICD-10-CM | POA: Diagnosis not present

## 2016-06-23 MED ORDER — METHYLPREDNISOLONE ACETATE 40 MG/ML IJ SUSP
40.0000 mg | Freq: Once | INTRAMUSCULAR | Status: AC
  Administered 2016-06-23: 40 mg via INTRA_ARTICULAR

## 2016-06-23 NOTE — Patient Instructions (Signed)
We will refer you to a foot/ankle surgeon for your left ankle. Out of work at least the next month. We gave you an injection into your right knee. It's likely you will have to see a surgeon for this knee to discuss replacement as well but we need to address the ankle first.

## 2016-06-28 NOTE — Assessment & Plan Note (Signed)
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  S/p cortisone injections and 5 supartz injections - still struggling though ankle is a higher priority.  Previously discussed tylenol, nsaids, glucosamine, topical medications.  Repeat cortisone injection today.  Would like to hold off on ortho referral for this until ankle is improved.  After informed written consent, patient was seated on exam table. Right knee was prepped with alcohol swab and utilizing anteromedial approach, patient's right knee was injected intraarticularly with 60m bupivicaine:depomedrol. Patient tolerated the procedure well without immediate complications.

## 2016-06-28 NOTE — Progress Notes (Signed)
PCP and consultation requested by: Ann Held, DO  Subjective:   HPI: Patient is a 66 y.o. female here for left ankle, right knee, left hip pain  12/5: Patient does not recall an injury. She reports having almost 6 months of lateral anterior left ankle pain. Pain level 4/10, constant and sharp. Worse when on feet a lot. No swelling or bruising. No skin changes, fever, other complaints.  06/03/15: Patient reports pain worsened since last visit. Pain level 8/10, sharp lateral ankle and anteriorly. Swelling comes and goes. Worse by end of day. No skin cahnges, fever, other complaints.  4/12: Patient reports she did well with injection for left ankle but pain started coming back past 1-2 weeks. Pain is 2/10, sharp anterior left ankle. Using arch supports. Some slight swelling. Worse with ambulation. 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 No locking, giving out. No skin changes, numbness.  6/14: Patient reports over past few weeks pain has returned in left ankle and right knee. Pain level 3/10 in knee, 5/10 in ankle. Pain in knee is anteromedial, worse with walking and can be sharp. Pain in ankle is still lateral, sharp. Also worse with ambulation. No catching, locking of knee or ankle. Will feel like left heel is going to give out. No skin changes, numbness.  8/24: Patient returns for injections for left ankle and right knee. Pain level 9/10 in ankle, 7/10 in right knee.  9/13: Patient reports only transient improvement from left ankle and right knee injections this time. Pain in both is 4/10. Also with left hip pain now also. Pain in hip is 9/10, sharp. Has to sit in car for 10-15 minutes before she gets out. No numbness or tingling. Pain in hip is anterior, lateral. No skin changes.  9/28: Patient returns to start supartz series for right knee. Pain 5/10. Intraarticular hip injection has helped her.  10/4: Patient  reports she feels about the same. Pain is 6/10 today.  10/12: Patient returns with 3/10 level of pain.  11/27: Patient reports she continues to have problems with left ankle, right knee, left hip. Left hip injection helped until past 1-2 weeks and started hurting again. Pain in right knee is 7/10 level, sharp. Worse with stairs, bending the knee. Pain in left ankle anterolateral. Radiates across top of foot and to knee. Pain 9/10 and sharp. No skin changes, numbness.  12/11: Patient returns for 4th supartz injection right knee. Cortisone injection given for left hip earlier today. Pain 4/10 level.  12/18: Patient returns for 5th supartz injection. Pain level 3/10.  06/23/16: Patient reports she is really struggling mainly with left ankle. Pain here is 9/10, difficult to bear any weight, sharp, anterior. Right knee bothering her also - s/p cortisone and supartz series x 5 shots. Knee pain is 6/10, anterior, also sharp. Taking tylenol. Using shoes with good arch support. No skin changes, numbness.  Past Medical History:  Diagnosis Date  . Aortic stenosis, mild 01/08/2015  . Cancer ([place]) lung    lung  . Hx of cancer of lung 6/10   Removed June 2010  . Hyperlipidemia   . Hypertension   . Low back pain   . Morbid obesity ([place]) 01/08/2015  . Vitamin D deficiency     Current Outpatient Prescriptions on File Prior to Visit  Medication Sig Dispense Refill  . Acetaminophen (TYLENOL 8 HOUR PO) Take by mouth daily. TAKE THREE TABLETS WITH BID EACH MEAL.    Marland Kitchen  aspirin 81 MG tablet Take 81 mg by mouth daily.     . Cholecalciferol 4000 UNITS CAPS Take 4,000 capsules by mouth daily.    . clonazePAM (KLONOPIN) 0.5 MG tablet TAKE 1 TABLET BY MOUTH TWICE A DAY AS NEEDED FOR ANXIETY 60 tablet 0  . furosemide (LASIX) 20 MG tablet TAKE 1 TABLET (20 MG TOTAL) BY MOUTH DAILY. (Patient taking differently: Take 20 mg by mouth daily as needed. ) 30 tablet 5  . Homeopathic Products ([employer] LEG  CRAMPS PAIN RELIEF) TABS Take by mouth as needed. [employer]    . olmesartan-hydrochlorothiazide (BENICAR HCT) 40-12.5 MG tablet TAKE 1 TABLET BY MOUTH DAILY. 30 tablet 5  . sertraline (ZOLOFT) 100 MG tablet TAKE 1 TABLET (100 MG TOTAL) BY MOUTH DAILY. 90 tablet 3  . Vitamin D, Ergocalciferol, (DRISDOL) 50000 units CAPS capsule Take 1 capsule (50,000 Units total) by mouth every 7 (seven) days. 12 capsule 5   No current facility-administered medications on file prior to visit.     Past Surgical History:  Procedure Laterality Date  . CHOLECYSTECTOMY    . LUNG REMOVAL, PARTIAL    . TUBAL LIGATION      Allergies  Allergen Reactions  . Penicillins Other (See Comments)    Fever     Social History   Social History  . Marital status: Divorced    Spouse name: N/A  . Number of children: 1  . Years of education: N/A   Occupational History  . [place] History Main Topics  . Smoking status: Former Smoker    Types: Cigarettes    Quit date: 02/01/2005  . Smokeless tobacco: Never Used  . Alcohol use No  . Drug use: No  . Sexual activity: Yes   Other Topics Concern  . Not on file   Social History Narrative   Exercise--  no    Family History  Problem Relation Age of Onset  . Hypertension Mother   . Stroke Mother   . Cancer Mother     lung  . Heart disease Father     cabg  . Cancer Brother     skin    BP 124/79   Pulse 77   Ht '5\' 4"'$  (1.626 m)   Wt 285 lb (129.3 kg)   BMI 48.92 kg/m   Review of Systems: See HPI above.    Objective:  Physical Exam:  Gen: NAD Exam not repeated today.   Right knee: No gross deformity, ecchymoses, effusion. TTP mildly medial and lateral joint lines. FROM. Negative ant/post drawers. Negative valgus/varus testing. Negative lachmanns. Negative mcmurrays, apleys, patellar apprehension. NV intact distally.  Left foot/ankle: Pes planus. Mild diffuse swelling.  No other gross  deformity, ecchymoses FROM with 5/5 strength all directions TTP over sinus tarsi, anterior ankle joint only. Negative ant drawer and talar tilt.   Negative syndesmotic compression. Thompsons test negative. NV intact distally  Assessment & Plan:  1. 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  MRI showed the arthropathy with edema in subtalar joint.  She did not tolerate orthotics.  Only transient benefit with ankle injections.  Subtalar injection a consideration - will go ahead with ortho referral for their opinion regarding possible surgical intervention.  2. 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  S/p cortisone injections and 5 supartz injections - still struggling though ankle is a higher priority.  Previously discussed tylenol, nsaids, glucosamine, topical medications.  Repeat cortisone  injection today.  Would like to hold off on ortho referral for this until ankle is improved.  After informed written consent, patient was seated on exam table. Right knee was prepped with alcohol swab and utilizing anteromedial approach, patient's right knee was injected intraarticularly with 41m bupivicaine:depomedrol. Patient tolerated the procedure well without immediate complications.

## 2016-06-28 NOTE — Assessment & Plan Note (Signed)
2/2 sinus tarsi syndrome, arthropathy.  MRI showed the arthropathy with edema in subtalar joint.  She did not tolerate orthotics.  Only transient benefit with ankle injections.  Subtalar injection a consideration - will go ahead with ortho referral for their opinion regarding possible surgical intervention.

## 2016-06-29 ENCOUNTER — Ambulatory Visit (INDEPENDENT_AMBULATORY_CARE_PROVIDER_SITE_OTHER): Payer: BC Managed Care – PPO | Admitting: Orthopedic Surgery

## 2016-06-29 ENCOUNTER — Encounter (INDEPENDENT_AMBULATORY_CARE_PROVIDER_SITE_OTHER): Payer: Self-pay | Admitting: Orthopedic Surgery

## 2016-06-29 ENCOUNTER — Other Ambulatory Visit: Payer: Self-pay | Admitting: Family Medicine

## 2016-06-29 VITALS — Ht 64.0 in | Wt 285.0 lb

## 2016-06-29 DIAGNOSIS — F419 Anxiety disorder, unspecified: Secondary | ICD-10-CM

## 2016-06-29 DIAGNOSIS — I87323 Chronic venous hypertension (idiopathic) with inflammation of bilateral lower extremity: Secondary | ICD-10-CM | POA: Diagnosis not present

## 2016-06-29 DIAGNOSIS — M25375 Other instability, left foot: Secondary | ICD-10-CM | POA: Diagnosis not present

## 2016-06-29 NOTE — Progress Notes (Signed)
Office Visit Note   Patient: Elizabeth [name]           Date of Birth: 1951/02/22           MRN: [id number] Visit Date: 06/29/2016              Requested by: Ann Held, DO [address] [place], [place] [address] PCP: Ann Held, DO  Chief Complaint  Patient presents with  . Right Ankle - Pain    HPI: Pt is here for a second opinion for her left ankle. MRI in September result sinus tarsi syndrome, arthropathy. She has had injections and tried orthotics and this has not been helpful. Here to discuss surgical treatment options. Autumn L Forrest, RMA   Patient is undergone excellent conservative therapy for the left foot. She has tried injections with temporary relief she has used orthotics use braces without any permanent relief. Patient states she cannot work currently due to the subtalar pain left foot. Assessment & Plan: Visit Diagnoses:  1. Idiopathic chronic venous hypertension of both lower extremities with inflammation   2. Subtalar joint instability, left     Plan: Discussed due to failure of conservative care her surgical option would be to consider subtalar fusion. Discussed that this would give her a difficulty on uneven terrain. Discussed that with her elevated BMI she does have increased risk of infection neurovascular injury nonhealing need for additional surgery discussed the importance of wearing compression stockings knee-high forever. Patient states she understands all questions were encouraged and answered and they will call when she is ready to proceed with surgery. Would plan for a posterior arthroscopic subtalar arthrodesis.  Follow-Up Instructions: Return if symptoms worsen or fail to improve.   Ortho Exam Examination patient is alert oriented no adenopathy well-dressed normal affect normal respiratory effort BMI 49. She has a good dorsalis pedis posterior tibial pulse. She has brawny skin color changes over the [place] area with no  open ulcers no cellulitis no drainage there is pitting edema up to the tibial tubercle. Examination she has pain reproduced with palpation of the sinus Tarsi. Inversion and eversion of the hindfoot reproduces her pain. There is no pain with range of motion the ankle there is no pain to palpation over the posterior tibial or peroneal tendons. The forefoot is nontender to palpation the Lisfranc joint is nontender to palpation. MRI scan was reviewed which does show some subtalar arthrosis.  Imaging: No results found.  Orders:  No orders of the defined types were placed in this encounter.  No orders of the defined types were placed in this encounter.    Procedures: No procedures performed  Clinical Data: No additional findings.  Subjective: Review of Systems  Objective: Vital Signs: Ht '5\' 4"'$  (1.626 m)   Wt 285 lb (129.3 kg)   BMI 48.92 kg/m   Specialty Comments:  No specialty comments available.  PMFS History: Patient Active Problem List   Diagnosis Date Noted  . Idiopathic chronic venous hypertension of both lower extremities with inflammation 06/29/2016  . Subtalar joint instability, left 06/29/2016  . Arthritis of right knee 01/30/2016  . Lower extremity edema 10/21/2015  . Leg cramps 08/21/2015  . Left ankle pain 04/09/2015  . Depression with anxiety 01/20/2015  . Aortic stenosis, mild 01/08/2015  . Morbid obesity ([place]) 01/08/2015  . Varicose veins 01/02/2015  . Snoring 12/06/2014  . Viral gastroenteritis 04/06/2013  . Insect bite of ankle, right, infected 07/31/2012  .  Primary cancer of right upper lobe of lung ([place]) 10/18/2011  . Hip pain 06/29/2011  . Pain in joint, lower leg 03/17/2009  . GASTROENTERITIS 07/11/2008  . PULMONARY NODULE, SOLITARY 04/18/2008  . LIVER FUNCTION TESTS, ABNORMAL, HX OF 04/01/2008  . B12 DEFICIENCY 11/13/2007  . COUGH 11/13/2007  . UNSPECIFIED VITAMIN D DEFICIENCY 11/07/2007  . TINEA PEDIS 07/07/2007  . LYMPHADENOPATHY 11/16/2006    . Hyperlipidemia 09/15/2006  . Essential hypertension 09/15/2006  . LOW BACK PAIN 09/15/2006   Past Medical History:  Diagnosis Date  . Aortic stenosis, mild 01/08/2015  . Cancer ([place]) lung    lung  . Hx of cancer of lung 6/10   Removed June 2010  . Hyperlipidemia   . Hypertension   . Low back pain   . Morbid obesity ([place]) 01/08/2015  . Vitamin D deficiency     Family History  Problem Relation Age of Onset  . Hypertension Mother   . Stroke Mother   . Cancer Mother     lung  . Heart disease Father     cabg  . Cancer Brother     skin    Past Surgical History:  Procedure Laterality Date  . CHOLECYSTECTOMY    . LUNG REMOVAL, PARTIAL    . TUBAL LIGATION     Social History   Occupational History  . [place] History Main Topics  . Smoking status: Former Smoker    Types: Cigarettes    Quit date: 02/01/2005  . Smokeless tobacco: Never Used  . Alcohol use No  . Drug use: No  . Sexual activity: Yes

## 2016-07-05 ENCOUNTER — Encounter: Payer: Self-pay | Admitting: Family Medicine

## 2016-07-05 NOTE — Telephone Encounter (Signed)
Ok to start by cutting zoloft in half ----f/u in office in 1 month or sooner prn

## 2016-07-08 ENCOUNTER — Encounter: Payer: Self-pay | Admitting: Cardiovascular Disease

## 2016-07-08 ENCOUNTER — Ambulatory Visit (INDEPENDENT_AMBULATORY_CARE_PROVIDER_SITE_OTHER): Payer: BC Managed Care – PPO | Admitting: Family Medicine

## 2016-07-08 ENCOUNTER — Ambulatory Visit (INDEPENDENT_AMBULATORY_CARE_PROVIDER_SITE_OTHER): Payer: BC Managed Care – PPO | Admitting: Cardiovascular Disease

## 2016-07-08 VITALS — BP 132/68 | HR 73 | Ht 64.0 in | Wt 286.0 lb

## 2016-07-08 DIAGNOSIS — I350 Nonrheumatic aortic (valve) stenosis: Secondary | ICD-10-CM

## 2016-07-08 DIAGNOSIS — I10 Essential (primary) hypertension: Secondary | ICD-10-CM | POA: Diagnosis not present

## 2016-07-08 DIAGNOSIS — M1711 Unilateral primary osteoarthritis, right knee: Secondary | ICD-10-CM

## 2016-07-08 DIAGNOSIS — E7800 Pure hypercholesterolemia, unspecified: Secondary | ICD-10-CM | POA: Diagnosis not present

## 2016-07-08 DIAGNOSIS — Z01818 Encounter for other preprocedural examination: Secondary | ICD-10-CM | POA: Diagnosis not present

## 2016-07-08 DIAGNOSIS — M25572 Pain in left ankle and joints of left foot: Secondary | ICD-10-CM

## 2016-07-08 NOTE — Patient Instructions (Signed)
Pick the CD up downstairs and fill out the form to get records from [place]. We will refer you to Dr. Maureen Ralphs for your knee. Call me if you need anything or have any questions. I will fill out your paperwork as well - we will contact you when these are completed.

## 2016-07-08 NOTE — Patient Instructions (Signed)
Medication Instructions:  Your physician recommends that you continue on your current medications as directed. Please refer to the Current Medication list given to you today.  Labwork: NONE  Testing/Procedures: Your physician has requested that you have an echocardiogram in August 2018. Echocardiography is a painless test that uses sound waves to create images of your heart. It provides your doctor with information about the size and shape of your heart and how well your heart's chambers and valves are working. This procedure takes approximately one hour. There are no restrictions for this procedure.   Follow-Up: Your physician wants you to follow-up in: August 2019 with Dr. Oval Linsey. You will receive a reminder letter in the mail two months in advance. If you don't receive a letter, please call our office to schedule the follow-up appointment.   Any Other Special Instructions Will Be Listed Below (If Applicable).     If you need a refill on your cardiac medications before your next appointment, please call your pharmacy.

## 2016-07-08 NOTE — Progress Notes (Signed)
Cardiology Office Note   Date:  07/11/2016   ID:  Elizabeth, [name] Dec 06, 1950, MRN [id number]  PCP:  Elizabeth Held, DO  Cardiologist:   Elizabeth Latch, MD   Chief Complaint  Patient presents with  . Medical Clearance    Pt states no Sx.       History of Present Illness: Elizabeth [name] is a 66 y.o. female with hypertension and mild aortic stenosis who presents for follow up.  Dr. Etter [name] noted a murmur 12/2014.  She was referred for an echo that revealed LVEF 60-60% with grade 1 diastolic dysfunction. She also had mild aortic stenosis with a mean gradient of 21mHg, peak velocity of 2.9 m/s and valve area of 1.5 cm^2.  At her cardiology appointment she reported leg claudication. She was referred for ABIs 01/2015 that were negative for occlusive disease. Diet and exercise was recommended for her elevated lipids.    Ms. Elizabeth [name] doing well from a heart standpoint. She denies chest pain and her breathing has [name] stable. She has occasional lower extremity edema in her left ankle that she attributes to her arthritis. It improves with elevation. She denies orthopnea or PND. Her physical activity is limited by arthritis and bilateral  Hips, knees, ankles, and her back. She is able to walk a flight of stairs or 4 blocks, though she is more limited by her arthritis than her breathing.  She notes rare episodes of palpitations that last for a few seconds at a time. She checks her blood pressure at home and it has [name] well hydrated control. It is also [name] within normal limits when she saw her PCP recently.   Past Medical History:  Diagnosis Date  . Aortic stenosis, mild 01/08/2015  . [place]    lung  . Hx of cancer of lung 6/10   Removed June 2010  . Hyperlipidemia   . Hypertension   . Low back pain   . Morbid obesity ([place] 01/08/2015  . Vitamin D deficiency     Past Surgical History:  Procedure Laterality Date  . CHOLECYSTECTOMY    . LUNG REMOVAL, PARTIAL    .  TUBAL LIGATION       Current Outpatient Prescriptions  Medication Sig Dispense Refill  . Acetaminophen (TYLENOL 8 HOUR PO) Take by mouth daily. TAKE THREE TABLETS WITH BID EACH MEAL.    .Marland Kitchenaspirin 81 MG tablet Take 81 mg by mouth daily.     . Cholecalciferol 4000 UNITS CAPS Take 4,000 capsules by mouth daily.    . clonazePAM (KLONOPIN) 0.5 MG tablet TAKE 1 TABLET BY MOUTH TWICE A DAY AS NEEDED FOR ANXIETY 60 tablet 0  . Homeopathic Products ([employer] LEG CRAMPS PAIN RELIEF) TABS Take by mouth as needed. [employer]   . olmesartan-hydrochlorothiazide (BENICAR HCT) 40-12.5 MG tablet TAKE 1 TABLET BY MOUTH DAILY. 30 tablet 5  . sertraline (ZOLOFT) 100 MG tablet TAKE 1 TABLET (100 MG TOTAL) BY MOUTH DAILY. 90 tablet 3  . Vitamin D, Ergocalciferol, (DRISDOL) 50000 units CAPS capsule Take 1 capsule (50,000 Units total) by mouth every 7 (seven) days. 12 capsule 5   No current facility-administered medications for this visit.     Allergies:   Penicillins    Social History:  The patient  reports that she quit smoking about 11 years ago. Her smoking use included Cigarettes. She has never used smokeless tobacco. She reports that she does not drink alcohol or use  drugs.   Family History:  The patient's family history includes Cancer in her brother and mother; Heart disease in her father; Hypertension in her mother; Stroke in her mother.    ROS:  Please see the history of present illness.   Otherwise, review of systems are positive for none.   All other systems are reviewed and negative.    PHYSICAL EXAM: VS:  BP 132/68   Pulse 73   Ht '5\' 4"'$  (1.626 m)   Wt 129.7 kg (286 lb)   BMI 49.09 kg/m  , BMI Body mass index is 49.09 kg/m. GENERAL:  Well appearing HEENT:  Pupils equal round and reactive, fundi not visualized, oral mucosa unremarkable NECK:  No jugular venous distention, waveform within normal limits, carotid upstroke brisk and symmetric, no bruits LYMPHATICS:  No cervical  adenopathy LUNGS:  Clear to auscultation bilaterally HEART:  RRR.  PMI not displaced or sustained,S1 and S2 within normal limits, no S3, no S4, no clicks, no rubs, III/VI early-peaking crescendo-decrescendo murmur at the LUSB. ABD:  Flat, positive bowel sounds normal in frequency in pitch, no bruits, no rebound, no guarding, no midline pulsatile mass, no hepatomegaly, no splenomegaly EXT:  2 plus pulses throughout, no edema, no cyanosis no clubbing SKIN:  No rashes no nodules NEURO:  Cranial nerves II through XII grossly intact, motor grossly intact throughout PSYCH:  Cognitively intact, oriented to person place and time   EKG:  EKG is ordered today. The ekg ordered 01/02/15  demonstrates sinus rhythm at 81 bpm. 07/08/16: Sinus rhythm.  Rate 73 bpm.    TTE 12/11/14: Study Conclusions  - Left ventricle: The cavity size was normal. There was mild concentric hypertrophy. Systolic function was normal. The estimated ejection fraction was in the range of 60% to 65%. Wall motion was normal; there were no regional wall motion abnormalities. Doppler parameters are consistent with abnormal left ventricular relaxation (grade 1 diastolic dysfunction). - Aortic valve: Cusp separation was reduced. There was moderate stenosis. Peak velocity (S): 292 cm/s. Mean gradient (S): 17 mm Hg. Valve area (VTI): 1.53 cm^2. Valve area (Vmax): 1.38 cm^2. Valve area (Vmean): 1.48 cm^2.  ABI 01/14/15: R 0.9, L 0.92  Recent Labs: 12/08/2015: ALT 17; BUN 20; Creatinine, Ser 0.83; Hemoglobin 12.3; Platelets 192.0; Potassium 3.8; Sodium 140; TSH 2.30    Lipid Panel    Component Value Date/Time   CHOL 141 12/08/2015 1213   TRIG 141.0 12/08/2015 1213   TRIG 73 04/14/2006 1429   HDL 36.40 (L) 12/08/2015 1213   CHOLHDL 4 12/08/2015 1213   VLDL 28.2 12/08/2015 1213   LDLCALC 76 12/08/2015 1213      Wt Readings from Last 3 Encounters:  07/08/16 129.7 kg (286 lb)  06/29/16 129.3 kg (285 lb)   06/23/16 129.3 kg (285 lb)      Other studies Reviewed: Additional studies/ records that were reviewed today include: . Review of the above records demonstrates:  Please see elsewhere in the note.     ASSESSMENT AND PLAN:  # Pre-op risk assessment:   The patient does not have any unstable cardiac conditions.  Upon evaluation today, she can achieve 4 METs or greater without anginal symptoms.  According to [facility] and AHA guidelines, she requires no further cardiac workup prior to her noncardiac surgery and should be at acceptable risk.  It is OK to hold aspirin 3 days prior to surgery.   # Mild-moderate aortic stenosis:  Ms. Heiser echo is consistent with mild aortic stenosis. She has  no evidence of heart failure on exam.   Repeat echo 12/2016, which will be 2 years from her previous echo.  # Hyperlipidemia: Continue to recommend diet and exercise.  # Hypertension:  Repeat  Blood pressure 132/68.  Continue to monitor. Continue olmesartan-HCTZ.  # Obesity: Ms. Iannello was encouraged to increase her physical activity and make at least one dietary intevention as above.    Current medicines are reviewed at length with the patient today.  The patient does not have concerns regarding medicines.  The following changes have [name] made:  no change  Labs/ tests ordered today include:   Orders Placed This Encounter  Procedures  . EKG 12-Lead  . ECHOCARDIOGRAM COMPLETE     Disposition:   FU with Dr. Jonelle Sidle C. [place] in 12/2017   Signed, Elizabeth Latch, MD  07/11/2016 3:34 PM    [place]

## 2016-07-09 ENCOUNTER — Telehealth (INDEPENDENT_AMBULATORY_CARE_PROVIDER_SITE_OTHER): Payer: Self-pay | Admitting: Orthopedic Surgery

## 2016-07-09 NOTE — Telephone Encounter (Signed)
Patient called requesting copy of progress note.  I verified her DOB and address.  She would like note mailed.  Ov note mailed per her request.

## 2016-07-14 NOTE — Progress Notes (Signed)
Patient came in today just to go over visit with foot/ankle surgeon and to talk about referral for her knee as well - will put referral in to see Dr. Maureen Ralphs - she plans to have ankle addressed before considering knee replacement and knows wait time to see him is a few months.  Paperwork filled out for handicap license plate, FMLA.

## 2016-07-25 ENCOUNTER — Other Ambulatory Visit: Payer: Self-pay | Admitting: Family Medicine

## 2016-07-25 DIAGNOSIS — I10 Essential (primary) hypertension: Secondary | ICD-10-CM

## 2016-07-26 ENCOUNTER — Encounter: Payer: Self-pay | Admitting: Family Medicine

## 2016-08-19 ENCOUNTER — Encounter: Payer: Self-pay | Admitting: Family Medicine

## 2016-08-20 ENCOUNTER — Other Ambulatory Visit: Payer: Self-pay | Admitting: Family Medicine

## 2016-08-20 DIAGNOSIS — M25552 Pain in left hip: Secondary | ICD-10-CM

## 2016-08-25 ENCOUNTER — Other Ambulatory Visit: Payer: Self-pay

## 2016-09-03 DIAGNOSIS — Z6841 Body Mass Index (BMI) 40.0 and over, adult: Secondary | ICD-10-CM | POA: Diagnosis not present

## 2016-09-03 DIAGNOSIS — I872 Venous insufficiency (chronic) (peripheral): Secondary | ICD-10-CM | POA: Diagnosis not present

## 2016-09-03 DIAGNOSIS — I87309 Chronic venous hypertension (idiopathic) without complications of unspecified lower extremity: Secondary | ICD-10-CM | POA: Diagnosis not present

## 2016-09-03 DIAGNOSIS — I878 Other specified disorders of veins: Secondary | ICD-10-CM | POA: Diagnosis not present

## 2016-09-06 ENCOUNTER — Ambulatory Visit
Admission: RE | Admit: 2016-09-06 | Discharge: 2016-09-06 | Disposition: A | Discharge status: Home / Self Care | Payer: Medicare Other | Source: Ambulatory Visit | Attending: Family Medicine | Admitting: Family Medicine

## 2016-09-06 DIAGNOSIS — M25552 Pain in left hip: Secondary | ICD-10-CM

## 2016-09-06 MED ORDER — IOPAMIDOL (ISOVUE-M 200) INJECTION 41%
1.0000 mL | Freq: Once | INTRAMUSCULAR | Status: AC
  Administered 2016-09-06: 1 mL via INTRA_ARTICULAR

## 2016-09-06 MED ORDER — METHYLPREDNISOLONE ACETATE 40 MG/ML INJ SUSP (RADIOLOG
120.0000 mg | Freq: Once | INTRAMUSCULAR | Status: AC
  Administered 2016-09-06: 120 mg via INTRA_ARTICULAR

## 2016-09-07 DIAGNOSIS — Z7982 Long term (current) use of aspirin: Secondary | ICD-10-CM | POA: Diagnosis not present

## 2016-09-07 DIAGNOSIS — M2142 Flat foot [pes planus] (acquired), left foot: Secondary | ICD-10-CM | POA: Diagnosis not present

## 2016-09-07 DIAGNOSIS — Z880 Allergy status to penicillin: Secondary | ICD-10-CM | POA: Diagnosis not present

## 2016-09-07 DIAGNOSIS — Z87891 Personal history of nicotine dependence: Secondary | ICD-10-CM | POA: Diagnosis not present

## 2016-09-07 DIAGNOSIS — Z79899 Other long term (current) drug therapy: Secondary | ICD-10-CM | POA: Diagnosis not present

## 2016-09-07 DIAGNOSIS — E785 Hyperlipidemia, unspecified: Secondary | ICD-10-CM | POA: Diagnosis not present

## 2016-09-07 DIAGNOSIS — M19072 Primary osteoarthritis, left ankle and foot: Secondary | ICD-10-CM | POA: Diagnosis not present

## 2016-09-07 DIAGNOSIS — I10 Essential (primary) hypertension: Secondary | ICD-10-CM | POA: Diagnosis not present

## 2016-09-09 ENCOUNTER — Other Ambulatory Visit: Payer: Self-pay | Admitting: Family Medicine

## 2016-09-09 ENCOUNTER — Encounter: Payer: Self-pay | Admitting: Family Medicine

## 2016-09-15 DIAGNOSIS — D1801 Hemangioma of skin and subcutaneous tissue: Secondary | ICD-10-CM | POA: Diagnosis not present

## 2016-09-15 DIAGNOSIS — M19072 Primary osteoarthritis, left ankle and foot: Secondary | ICD-10-CM | POA: Diagnosis not present

## 2016-09-15 DIAGNOSIS — Z87891 Personal history of nicotine dependence: Secondary | ICD-10-CM | POA: Diagnosis not present

## 2016-09-15 DIAGNOSIS — Z7982 Long term (current) use of aspirin: Secondary | ICD-10-CM | POA: Diagnosis not present

## 2016-09-15 DIAGNOSIS — G8929 Other chronic pain: Secondary | ICD-10-CM | POA: Diagnosis not present

## 2016-09-15 DIAGNOSIS — L821 Other seborrheic keratosis: Secondary | ICD-10-CM | POA: Diagnosis not present

## 2016-09-15 DIAGNOSIS — Z880 Allergy status to penicillin: Secondary | ICD-10-CM | POA: Diagnosis not present

## 2016-09-15 DIAGNOSIS — L570 Actinic keratosis: Secondary | ICD-10-CM | POA: Diagnosis not present

## 2016-09-15 DIAGNOSIS — Z85118 Personal history of other malignant neoplasm of bronchus and lung: Secondary | ICD-10-CM | POA: Diagnosis not present

## 2016-09-15 DIAGNOSIS — D692 Other nonthrombocytopenic purpura: Secondary | ICD-10-CM | POA: Diagnosis not present

## 2016-09-16 ENCOUNTER — Other Ambulatory Visit: Payer: Self-pay | Admitting: Family Medicine

## 2016-09-16 DIAGNOSIS — Z01810 Encounter for preprocedural cardiovascular examination: Secondary | ICD-10-CM | POA: Diagnosis not present

## 2016-09-17 DIAGNOSIS — M1711 Unilateral primary osteoarthritis, right knee: Secondary | ICD-10-CM | POA: Diagnosis not present

## 2016-09-17 MED ORDER — CLONAZEPAM 0.5 MG PO TABS: ORAL_TABLET | ORAL | 2 refills | Status: DC

## 2016-09-24 DIAGNOSIS — Z880 Allergy status to penicillin: Secondary | ICD-10-CM | POA: Diagnosis not present

## 2016-09-24 DIAGNOSIS — Z981 Arthrodesis status: Secondary | ICD-10-CM | POA: Diagnosis not present

## 2016-09-24 DIAGNOSIS — Z85118 Personal history of other malignant neoplasm of bronchus and lung: Secondary | ICD-10-CM | POA: Diagnosis not present

## 2016-09-24 DIAGNOSIS — M7732 Calcaneal spur, left foot: Secondary | ICD-10-CM | POA: Diagnosis not present

## 2016-09-24 DIAGNOSIS — G8929 Other chronic pain: Secondary | ICD-10-CM | POA: Diagnosis not present

## 2016-09-24 DIAGNOSIS — M19072 Primary osteoarthritis, left ankle and foot: Secondary | ICD-10-CM | POA: Diagnosis not present

## 2016-09-24 DIAGNOSIS — Z87891 Personal history of nicotine dependence: Secondary | ICD-10-CM | POA: Diagnosis not present

## 2016-09-24 DIAGNOSIS — G8918 Other acute postprocedural pain: Secondary | ICD-10-CM | POA: Diagnosis not present

## 2016-09-24 DIAGNOSIS — Z7982 Long term (current) use of aspirin: Secondary | ICD-10-CM | POA: Diagnosis not present

## 2016-09-27 DIAGNOSIS — I998 Other disorder of circulatory system: Secondary | ICD-10-CM | POA: Diagnosis not present

## 2016-09-27 DIAGNOSIS — M1712 Unilateral primary osteoarthritis, left knee: Secondary | ICD-10-CM | POA: Diagnosis not present

## 2016-09-27 DIAGNOSIS — Q6689 Other  specified congenital deformities of feet: Secondary | ICD-10-CM | POA: Diagnosis not present

## 2016-09-27 DIAGNOSIS — M7752 Other enthesopathy of left foot: Secondary | ICD-10-CM | POA: Diagnosis not present

## 2016-09-27 DIAGNOSIS — M19072 Primary osteoarthritis, left ankle and foot: Secondary | ICD-10-CM | POA: Diagnosis not present

## 2016-09-27 DIAGNOSIS — M79672 Pain in left foot: Secondary | ICD-10-CM | POA: Diagnosis not present

## 2016-09-27 DIAGNOSIS — Z981 Arthrodesis status: Secondary | ICD-10-CM | POA: Diagnosis not present

## 2016-09-27 DIAGNOSIS — Z4689 Encounter for fitting and adjustment of other specified devices: Secondary | ICD-10-CM | POA: Diagnosis not present

## 2016-09-27 DIAGNOSIS — M25572 Pain in left ankle and joints of left foot: Secondary | ICD-10-CM | POA: Diagnosis not present

## 2016-10-07 ENCOUNTER — Telehealth: Payer: Self-pay | Admitting: Adult Health

## 2016-10-07 NOTE — Telephone Encounter (Signed)
Patient called and moved lab/LTS visit from June to third week in August due to just had surgery and an upcoming vacation. Gave patient new appointments for lab 8/13 and LTS 8/20. Patient will contact central radiology re scheduling scan in August.

## 2016-10-07 NOTE — Telephone Encounter (Signed)
per pt r/s from june to thrid week in aug due to surgery and vac

## 2016-10-14 ENCOUNTER — Other Ambulatory Visit: Payer: Self-pay

## 2016-10-15 ENCOUNTER — Encounter: Payer: Self-pay | Admitting: Family Medicine

## 2016-10-19 DIAGNOSIS — M19072 Primary osteoarthritis, left ankle and foot: Secondary | ICD-10-CM | POA: Diagnosis not present

## 2016-10-19 DIAGNOSIS — Z4789 Encounter for other orthopedic aftercare: Secondary | ICD-10-CM | POA: Diagnosis not present

## 2016-10-19 DIAGNOSIS — Z981 Arthrodesis status: Secondary | ICD-10-CM | POA: Diagnosis not present

## 2016-10-21 ENCOUNTER — Encounter: Payer: Self-pay | Admitting: Adult Health

## 2016-11-08 DIAGNOSIS — Z981 Arthrodesis status: Secondary | ICD-10-CM | POA: Diagnosis not present

## 2016-11-08 DIAGNOSIS — M85872 Other specified disorders of bone density and structure, left ankle and foot: Secondary | ICD-10-CM | POA: Diagnosis not present

## 2016-11-08 DIAGNOSIS — Z09 Encounter for follow-up examination after completed treatment for conditions other than malignant neoplasm: Secondary | ICD-10-CM | POA: Diagnosis not present

## 2016-11-08 DIAGNOSIS — M19072 Primary osteoarthritis, left ankle and foot: Secondary | ICD-10-CM | POA: Diagnosis not present

## 2016-11-15 ENCOUNTER — Encounter: Payer: Self-pay | Admitting: Family Medicine

## 2016-11-16 ENCOUNTER — Other Ambulatory Visit: Payer: Self-pay | Admitting: Family Medicine

## 2016-11-16 DIAGNOSIS — M25552 Pain in left hip: Secondary | ICD-10-CM

## 2016-11-22 ENCOUNTER — Other Ambulatory Visit: Payer: Self-pay | Admitting: Family Medicine

## 2016-11-22 DIAGNOSIS — Z1231 Encounter for screening mammogram for malignant neoplasm of breast: Secondary | ICD-10-CM

## 2016-11-24 ENCOUNTER — Encounter: Payer: Self-pay | Admitting: Family Medicine

## 2016-11-25 ENCOUNTER — Ambulatory Visit (HOSPITAL_COMMUNITY): Payer: Medicare Other

## 2016-11-25 ENCOUNTER — Other Ambulatory Visit: Payer: Self-pay

## 2016-11-29 ENCOUNTER — Encounter: Payer: Self-pay | Admitting: Adult Health

## 2016-11-29 ENCOUNTER — Ambulatory Visit
Admission: RE | Admit: 2016-11-29 | Discharge: 2016-11-29 | Disposition: A | Discharge status: Home / Self Care | Payer: Medicare Other | Source: Ambulatory Visit | Attending: Family Medicine | Admitting: Family Medicine

## 2016-11-29 DIAGNOSIS — M25552 Pain in left hip: Secondary | ICD-10-CM | POA: Diagnosis not present

## 2016-11-29 MED ORDER — METHYLPREDNISOLONE ACETATE 40 MG/ML INJ SUSP (RADIOLOG
120.0000 mg | Freq: Once | INTRAMUSCULAR | Status: AC
  Administered 2016-11-29: 120 mg via INTRA_ARTICULAR

## 2016-11-29 MED ORDER — IOPAMIDOL (ISOVUE-M 200) INJECTION 41%
1.0000 mL | Freq: Once | INTRAMUSCULAR | Status: AC
  Administered 2016-11-29: 1 mL via INTRA_ARTICULAR

## 2016-11-30 ENCOUNTER — Ambulatory Visit (INDEPENDENT_AMBULATORY_CARE_PROVIDER_SITE_OTHER): Payer: Medicare Other | Admitting: Family Medicine

## 2016-11-30 ENCOUNTER — Encounter: Payer: Self-pay | Admitting: Family Medicine

## 2016-11-30 VITALS — BP 102/68 | HR 72 | Ht 64.0 in | Wt 275.0 lb

## 2016-11-30 DIAGNOSIS — M1711 Unilateral primary osteoarthritis, right knee: Secondary | ICD-10-CM

## 2016-11-30 MED ORDER — METHYLPREDNISOLONE ACETATE 40 MG/ML IJ SUSP
40.0000 mg | Freq: Once | INTRAMUSCULAR | Status: AC
  Administered 2016-11-30: 40 mg via INTRA_ARTICULAR

## 2016-11-30 NOTE — Assessment & Plan Note (Signed)
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  Has done cortisone and viscosupplementation in past.  Discussed tylenol, aleve, topical medications, supplements that may help.  Repeated cortisone injection today.    After informed written consent, patient was seated on exam table. Right knee was prepped with alcohol swab and utilizing anteromedial approach, patient's right knee was injected intraarticularly with 3:56mL bupivicaine: depomedrol. Patient tolerated the procedure well without immediate complications.

## 2016-11-30 NOTE — Patient Instructions (Signed)

## 2016-11-30 NOTE — Progress Notes (Signed)
PCP and consultation requested by: Ann Held, DO  Subjective:   HPI: Patient is a 66 y.o. female here for left ankle, right knee, left hip pain  12/5: Patient does not recall an injury. She reports having almost 6 months of lateral anterior left ankle pain. Pain level 4/10, constant and sharp. Worse when on feet a lot. No swelling or bruising. No skin changes, fever, other complaints.  06/03/15: Patient reports pain worsened since last visit. Pain level 8/10, sharp lateral ankle and anteriorly. Swelling comes and goes. Worse by end of day. No skin cahnges, fever, other complaints.  4/12: Patient reports she did well with injection for left ankle but pain started coming back past 1-2 weeks. Pain is 2/10, sharp anterior left ankle. Using arch supports. Some slight swelling. Worse with ambulation. 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 No locking, giving out. No skin changes, numbness.  6/14: Patient reports over past few weeks pain has returned in left ankle and right knee. Pain level 3/10 in knee, 5/10 in ankle. Pain in knee is anteromedial, worse with walking and can be sharp. Pain in ankle is still lateral, sharp. Also worse with ambulation. No catching, locking of knee or ankle. Will feel like left heel is going to give out. No skin changes, numbness.  8/24: Patient returns for injections for left ankle and right knee. Pain level 9/10 in ankle, 7/10 in right knee.  9/13: Patient reports only transient improvement from left ankle and right knee injections this time. Pain in both is 4/10. Also with left hip pain now also. Pain in hip is 9/10, sharp. Has to sit in car for 10-15 minutes before she gets out. No numbness or tingling. Pain in hip is anterior, lateral. No skin changes.  9/28: Patient returns to start supartz series for right knee. Pain 5/10. Intraarticular hip injection has helped her.  10/4: Patient  reports she feels about the same. Pain is 6/10 today.  10/12: Patient returns with 3/10 level of pain.  11/27: Patient reports she continues to have problems with left ankle, right knee, left hip. Left hip injection helped until past 1-2 weeks and started hurting again. Pain in right knee is 7/10 level, sharp. Worse with stairs, bending the knee. Pain in left ankle anterolateral. Radiates across top of foot and to knee. Pain 9/10 and sharp. No skin changes, numbness.  12/11: Patient returns for 4th supartz injection right knee. Cortisone injection given for left hip earlier today. Pain 4/10 level.  12/18: Patient returns for 5th supartz injection. Pain level 3/10.  06/23/16: Patient reports she is really struggling mainly with left ankle. Pain here is 9/10, difficult to bear any weight, sharp, anterior. Right knee bothering her also - s/p cortisone and supartz series x 5 shots. Knee pain is 6/10, anterior, also sharp. Taking tylenol. Using shoes with good arch support. No skin changes, numbness.  7/31: Patient has had left subtalar arthrodesis 5/25 and is doing well. She is wearing cam walker. Here today as right knee has flared up again at 10/10 level sharp and anterior with walking especially. Associated swelling. Would like an injection. Has seen Dr. Maureen Ralphs and is losing weight with preparation to have joint replacement either at end of this year or early next year. No skin changes, numbness.  Past Medical History:  Diagnosis Date  . Aortic stenosis, mild 01/08/2015  . Cancer ([place] lung    lung  . Hx of cancer of  lung 6/10   Removed June 2010  . Hyperlipidemia   . Hypertension   . Low back pain   . Morbid obesity ([place]) 01/08/2015  . Vitamin D deficiency     Current Outpatient Prescriptions on File Prior to Visit  Medication Sig Dispense Refill  . Acetaminophen (TYLENOL 8 HOUR PO) Take by mouth daily. TAKE THREE TABLETS WITH BID EACH MEAL.    Marland Kitchen aspirin 81 MG  tablet Take 81 mg by mouth daily.     . Cholecalciferol 4000 UNITS CAPS Take 4,000 capsules by mouth daily.    . clonazePAM (KLONOPIN) 0.5 MG tablet TAKE 1 TABLET BY MOUTH TWICE A DAY AS NEEDED FOR ANXIETY 60 tablet 2  . Homeopathic Products ([employer]S LEG CRAMPS PAIN RELIEF) TABS Take by mouth as needed. [employer]    . olmesartan-hydrochlorothiazide (BENICAR HCT) 40-12.5 MG tablet TAKE 1 TABLET BY MOUTH DAILY. 30 tablet 5  . Vitamin D, Ergocalciferol, (DRISDOL) 50000 units CAPS capsule Take 1 capsule (50,000 Units total) by mouth every 7 (seven) days. 12 capsule 5   No current facility-administered medications on file prior to visit.     Past Surgical History:  Procedure Laterality Date  . CHOLECYSTECTOMY    . LUNG REMOVAL, PARTIAL    . TUBAL LIGATION      Allergies  Allergen Reactions  . Penicillins Other (See Comments)    Fever     Social History   Social History  . Marital status: Divorced    Spouse name: N/A  . Number of children: 1  . Years of education: N/A   Occupational History  . [place] History Main Topics  . Smoking status: Former Smoker    Types: Cigarettes    Quit date: 02/01/2005  . Smokeless tobacco: Never Used  . Alcohol use No  . Drug use: No  . Sexual activity: Yes   Other Topics Concern  . Not on file   Social History Narrative   Exercise--  no    Family History  Problem Relation Age of Onset  . Hypertension Mother   . Stroke Mother   . Cancer Mother        lung  . Heart disease Father        cabg  . Cancer Brother        skin    BP 102/68   Pulse 72   Ht 5\' 4"  (1.626 m)   Wt 275 lb (124.7 kg)   BMI 47.20 kg/m   Review of Systems: See HPI above.    Objective:  Physical Exam:  Gen: NAD Exam not repeated today.   Right knee: No gross deformity, ecchymoses, effusion. TTP mildly medial and lateral joint lines. FROM. Negative ant/post drawers. Negative valgus/varus testing.  Negative lachmanns. Negative mcmurrays, apleys, patellar apprehension. NV intact distally.  Left knee: FROM without pain.  Assessment & Plan:  1. 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  Has done cortisone and viscosupplementation in past.  Discussed tylenol, aleve, topical medications, supplements that may help.  Repeated cortisone injection today.    After informed written consent, patient was seated on exam table. Right knee was prepped with alcohol swab and utilizing anteromedial approach, patient's right knee was injected intraarticularly with 3:56mL bupivicaine: depomedrol. Patient tolerated the procedure well without immediate complications.

## 2016-12-09 ENCOUNTER — Other Ambulatory Visit: Payer: Self-pay

## 2016-12-09 ENCOUNTER — Ambulatory Visit (HOSPITAL_COMMUNITY): Payer: Medicare Other | Attending: Cardiology

## 2016-12-09 DIAGNOSIS — I350 Nonrheumatic aortic (valve) stenosis: Secondary | ICD-10-CM | POA: Insufficient documentation

## 2016-12-10 ENCOUNTER — Telehealth: Payer: Self-pay | Admitting: *Deleted

## 2016-12-10 NOTE — Telephone Encounter (Signed)
TC from patient requesting an appt be rescheduled for labs, long term survivorship and CT scan.  Pt has had to cancel a couple of times. Gave pt # to [employer] for CT Scan reschedule.  LOS sent for lab and survivorship.

## 2016-12-13 ENCOUNTER — Telehealth: Payer: Self-pay | Admitting: Adult Health

## 2016-12-13 ENCOUNTER — Other Ambulatory Visit: Payer: Self-pay

## 2016-12-13 NOTE — Telephone Encounter (Signed)
lvm to inform pt pt LTS appt 9/14 at 11 am per sch msg

## 2016-12-14 ENCOUNTER — Ambulatory Visit (INDEPENDENT_AMBULATORY_CARE_PROVIDER_SITE_OTHER): Payer: Medicare Other | Admitting: Family Medicine

## 2016-12-14 ENCOUNTER — Encounter: Payer: Self-pay | Admitting: Family Medicine

## 2016-12-14 ENCOUNTER — Ambulatory Visit: Payer: Self-pay | Admitting: Family Medicine

## 2016-12-14 VITALS — BP 124/72 | HR 91 | Temp 97.8°F | Ht 64.0 in | Wt 275.0 lb

## 2016-12-14 DIAGNOSIS — E785 Hyperlipidemia, unspecified: Secondary | ICD-10-CM | POA: Diagnosis not present

## 2016-12-14 DIAGNOSIS — I10 Essential (primary) hypertension: Secondary | ICD-10-CM | POA: Diagnosis not present

## 2016-12-14 DIAGNOSIS — E7800 Pure hypercholesterolemia, unspecified: Secondary | ICD-10-CM | POA: Diagnosis not present

## 2016-12-14 MED ORDER — OLMESARTAN MEDOXOMIL-HCTZ 40-12.5 MG PO TABS: 1.0000 | ORAL_TABLET | Freq: Every day | ORAL | 3 refills | Status: DC

## 2016-12-14 NOTE — Patient Instructions (Signed)

## 2016-12-14 NOTE — Assessment & Plan Note (Signed)
Tolerating statin, encouraged heart healthy diet, avoid trans fats, minimize simple carbs and saturated fats. Increase exercise as tolerated 

## 2016-12-14 NOTE — Assessment & Plan Note (Signed)
Well controlled, no changes to meds. Encouraged heart healthy diet such as the DASH diet and exercise as tolerated.  °

## 2016-12-14 NOTE — Progress Notes (Signed)
Pre visit review using our clinic review tool, if applicable. No additional management support is needed unless otherwise documented below in the visit note. 

## 2016-12-14 NOTE — Progress Notes (Signed)
Patient ID: Elizabeth [name], female    DOB: 05-30-1950  Age: 66 y.o. MRN: [id number]    Subjective:  Subjective  HPI Elizabeth [name] presents for f/u from d/c zoloft.-- she is doing well and she needs a refill on her bp meds.    Review of Systems  Constitutional: Negative for activity change, appetite change, fatigue and unexpected weight change.  Respiratory: Negative for cough and shortness of breath.   Cardiovascular: Negative for chest pain and palpitations.  Psychiatric/Behavioral: Negative for behavioral problems and dysphoric mood. The patient is not nervous/anxious.     History Past Medical History:  Diagnosis Date  . Aortic stenosis, mild 01/08/2015  . Cancer ([place]) lung    lung  . Hx of cancer of lung 6/10   Removed June 2010  . Hyperlipidemia   . Hypertension   . Low back pain   . Morbid obesity ([place]) 01/08/2015  . Vitamin D deficiency     She has a past surgical history that includes Cholecystectomy; Tubal ligation; and Lung removal, partial.   Her family history includes Cancer in her brother and mother; Heart disease in her father; Hypertension in her mother; Stroke in her mother.She reports that she quit smoking about 11 years ago. Her smoking use included Cigarettes. She has never used smokeless tobacco. She reports that she does not drink alcohol or use drugs.  Current Outpatient Prescriptions on File Prior to Visit  Medication Sig Dispense Refill  . Acetaminophen (TYLENOL 8 HOUR PO) Take by mouth daily. TAKE THREE TABLETS WITH BID EACH MEAL.    Marland Kitchen aspirin 81 MG tablet Take 81 mg by mouth daily.     . Cholecalciferol 4000 UNITS CAPS Take 4,000 capsules by mouth daily.    . clonazePAM (KLONOPIN) 0.5 MG tablet TAKE 1 TABLET BY MOUTH TWICE A DAY AS NEEDED FOR ANXIETY 60 tablet 2  . Homeopathic Products ([employer] LEG CRAMPS PAIN RELIEF) TABS Take by mouth as needed. [employer]    . HYDROcodone-acetaminophen (NORCO/VICODIN) 5-325 MG tablet TK 1 T PO Q 6-8 H.  WEAN AS ABLE  0    . Vitamin D, Ergocalciferol, (DRISDOL) 50000 units CAPS capsule Take 1 capsule (50,000 Units total) by mouth every 7 (seven) days. 12 capsule 5   No current facility-administered medications on file prior to visit.      Objective:  Objective  Physical Exam  Constitutional: She is oriented to person, place, and time. She appears well-developed and well-nourished.  HENT:  Head: Normocephalic and atraumatic.  Eyes: Conjunctivae and EOM are normal.  Neck: Normal range of motion. Neck supple. No JVD present. Carotid bruit is not present. No thyromegaly present.  Cardiovascular: Normal rate, regular rhythm and normal heart sounds.   No murmur heard. Pulmonary/Chest: Effort normal and breath sounds normal. No respiratory distress. She has no wheezes. She has no rales. She exhibits no tenderness.  Musculoskeletal: She exhibits no edema.  Neurological: She is alert and oriented to person, place, and time.  Psychiatric: She has a normal mood and affect. Her behavior is normal. Judgment and thought content normal.  Nursing note and vitals reviewed.  BP 124/72 (BP Location: Left Arm, Patient Position: Sitting, Cuff Size: Large)   Pulse 91   Temp 97.8 F (36.6 C) (Oral)   Ht 5\' 4"  (1.626 m)   Wt 275 lb (124.7 kg)   SpO2 97%   BMI 47.20 kg/m  Wt Readings from Last 3 Encounters:  12/14/16 275 lb (124.7 kg)  11/30/16  275 lb (124.7 kg)  07/08/16 286 lb (129.7 kg)     Lab Results  Component Value Date   WBC 7.1 12/08/2015   HGB 12.3 12/08/2015   HCT 37.0 12/08/2015   PLT 192.0 12/08/2015   GLUCOSE 88 12/08/2015   CHOL 141 12/08/2015   TRIG 141.0 12/08/2015   HDL 36.40 (L) 12/08/2015   LDLCALC 76 12/08/2015   ALT 17 12/08/2015   AST 19 12/08/2015   NA 140 12/08/2015   K 3.8 12/08/2015   CL 103 12/08/2015   CREATININE 0.83 12/08/2015   BUN 20 12/08/2015   CO2 30 12/08/2015   TSH 2.30 12/08/2015   INR 1.0 10/16/2008   HGBA1C 5.5 01/18/2015    Dg Fluoro Guided Needle Plc  Aspiration/injection Loc  Result Date: 11/29/2016 CLINICAL DATA:  Left hip pain. FLUOROSCOPY TIME:  Radiation Exposure Index (as provided by the fluoroscopic device): 85.20 uGy*m2 Fluoroscopy Time:  20 seconds Number of Acquired Images:  0 PROCEDURE: Following informed, written consent, the patient was positioned supine and the left hip was located under fluoroscopy. The skin was prepped and draped in the usual sterile fashion. The superficial soft tissues were anesthetized with 1% lidocaine. A 22 gauge needle was advanced into the joint. Placement was confirmed with 1.0 ml of Isovue M 200. I then injected 120 mg Depo-Medrol and 1.5 mL 0.5% bupivacaine. The patient tolerated the procedure without immediate complication. IMPRESSION: Technically successful left hip steroid injection. Electronically Signed   By: San Morelle M.D.   On: 11/29/2016 13:09     Assessment & Plan:  Plan  I am having Ms. Collyer maintain her CVS LEG CRAMPS PAIN RELIEF, Cholecalciferol, aspirin, Acetaminophen (TYLENOL 8 HOUR PO), Vitamin D (Ergocalciferol), clonazePAM, HYDROcodone-acetaminophen, and olmesartan-hydrochlorothiazide.  Meds ordered this encounter  Medications  . olmesartan-hydrochlorothiazide (BENICAR HCT) 40-12.5 MG tablet    Sig: Take 1 tablet by mouth daily.    Dispense:  90 tablet    Refill:  3    Problem List Items Addressed This Visit      Unprioritized   Essential hypertension    Well controlled, no changes to meds. Encouraged heart healthy diet such as the DASH diet and exercise as tolerated.       Relevant Medications   olmesartan-hydrochlorothiazide (BENICAR HCT) 40-12.5 MG tablet   Other Relevant Orders   Lipid panel   Hyperlipidemia    Tolerating statin, encouraged heart healthy diet, avoid trans fats, minimize simple carbs and saturated fats. Increase exercise as tolerated      Relevant Medications   olmesartan-hydrochlorothiazide (BENICAR HCT) 40-12.5 MG tablet    Other Visit  Diagnoses    Hyperlipidemia LDL goal <100    -  Primary   Relevant Medications   olmesartan-hydrochlorothiazide (BENICAR HCT) 40-12.5 MG tablet   Other Relevant Orders   Comprehensive metabolic panel      Follow-up: Return in about 6 months (around 06/16/2017) for hypertension.  Ann Held, DO

## 2016-12-15 ENCOUNTER — Ambulatory Visit: Payer: Medicare Other | Admitting: Cardiovascular Disease

## 2016-12-20 ENCOUNTER — Encounter: Payer: Self-pay | Admitting: Adult Health

## 2016-12-20 ENCOUNTER — Telehealth: Payer: Self-pay | Admitting: Family Medicine

## 2016-12-20 DIAGNOSIS — I10 Essential (primary) hypertension: Secondary | ICD-10-CM

## 2016-12-20 DIAGNOSIS — E1169 Type 2 diabetes mellitus with other specified complication: Secondary | ICD-10-CM

## 2016-12-20 DIAGNOSIS — E785 Hyperlipidemia, unspecified: Secondary | ICD-10-CM

## 2016-12-20 NOTE — Telephone Encounter (Signed)
Relation to [facility] Call back [phone] Pharmacy:  Reason for call:  Patient is due for labs only in September and patient would like orders placed at [facility], please advise when orders are placed.

## 2016-12-21 DIAGNOSIS — Z981 Arthrodesis status: Secondary | ICD-10-CM | POA: Diagnosis not present

## 2016-12-21 DIAGNOSIS — M19072 Primary osteoarthritis, left ankle and foot: Secondary | ICD-10-CM | POA: Diagnosis not present

## 2016-12-21 NOTE — Telephone Encounter (Signed)
Lab work entered per patient request. LB

## 2016-12-23 ENCOUNTER — Other Ambulatory Visit: Payer: Self-pay

## 2016-12-23 ENCOUNTER — Ambulatory Visit (HOSPITAL_BASED_OUTPATIENT_CLINIC_OR_DEPARTMENT_OTHER): Payer: Self-pay

## 2016-12-24 DIAGNOSIS — L821 Other seborrheic keratosis: Secondary | ICD-10-CM | POA: Diagnosis not present

## 2016-12-24 DIAGNOSIS — L570 Actinic keratosis: Secondary | ICD-10-CM | POA: Diagnosis not present

## 2016-12-27 ENCOUNTER — Ambulatory Visit: Payer: Medicare Other | Admitting: Physical Therapy

## 2016-12-30 ENCOUNTER — Encounter: Payer: Self-pay | Admitting: Physical Therapy

## 2016-12-30 ENCOUNTER — Ambulatory Visit: Payer: Medicare Other | Attending: Orthopedic Surgery | Admitting: Physical Therapy

## 2016-12-30 DIAGNOSIS — R262 Difficulty in walking, not elsewhere classified: Secondary | ICD-10-CM | POA: Diagnosis not present

## 2016-12-30 DIAGNOSIS — M25672 Stiffness of left ankle, not elsewhere classified: Secondary | ICD-10-CM

## 2016-12-30 DIAGNOSIS — R2689 Other abnormalities of gait and mobility: Secondary | ICD-10-CM | POA: Diagnosis not present

## 2016-12-30 DIAGNOSIS — M25572 Pain in left ankle and joints of left foot: Secondary | ICD-10-CM | POA: Insufficient documentation

## 2016-12-30 DIAGNOSIS — M6281 Muscle weakness (generalized): Secondary | ICD-10-CM | POA: Diagnosis not present

## 2016-12-30 NOTE — Therapy (Signed)
[facility] [address]  [place], [place], [address] Phone: [phone]   Fax:  [phone]  Physical Therapy Evaluation  Patient Details  Name: Elizabeth [name] MRN: [id number] Date of Birth: 06-04-1950 Referring Provider: Dr. Bernarda Caffey  Encounter Date: 12/30/2016      PT End of Session - 12/30/16 1809    Visit Number 1   Number of Visits 16   Date for PT Re-Evaluation 02/24/17   Authorization Type Medicare   PT Start Time 9518   PT Stop Time 1440   PT Time Calculation (min) 35 min   Activity Tolerance Patient tolerated treatment well   Behavior During Therapy [facility] for tasks assessed/performed      Past Medical History:  Diagnosis Date  . Aortic stenosis, mild 01/08/2015  . Cancer ([place]) lung    lung  . Hx of cancer of lung 6/10   Removed June 2010  . Hyperlipidemia   . Hypertension   . Low back pain   . Morbid obesity ([place]) 01/08/2015  . Vitamin D deficiency     Past Surgical History:  Procedure Laterality Date  . CHOLECYSTECTOMY    . LUNG REMOVAL, PARTIAL    . TUBAL LIGATION      There were no vitals filed for this visit.       Subjective Assessment - 12/30/16 1408    Subjective Patient s/p ankle fusion on 09/24/16 - prior to surgery patient reporitng "bone on bone" - works as a Designer, fashion/clothing at an [employer] - little time to sit down. Nw out of CAM boot for approx 1 week per patient. Does have some aching and sensitivity in ankle. Does have numbness from mid-shin down. Has not been icing. Has weaned self from pain meds. wants to improve balance and walking distance   Limitations Standing;Walking   Diagnostic tests Xray - normal healing   Patient Stated Goals improve balance and ability to walk   Currently in Pain? Yes   Pain Score 3   with walking   Pain Location Ankle   Pain Orientation Left   Pain Descriptors / Indicators Aching;Sore   Pain Type Surgical pain   Pain Onset  More than a month ago   Pain Frequency Intermittent            [facility] PT Assessment - 12/30/16 1413      Assessment   Medical Diagnosis s/p L ankle arthrodesis   Referring Provider Dr. Bernarda Caffey   Onset Date/Surgical Date 09/24/16   Next MD Visit --  ~3 months   Prior Therapy no     Precautions   Precautions None     Restrictions   Weight Bearing Restrictions Yes   LLE Weight Bearing Weight bearing as tolerated   Other Position/Activity Restrictions patient released from cam boot 12/21/16     Balance Screen   Has the patient fallen in the past 6 months No   Has the patient had a decrease in activity level because of a fear of falling?  No   Is the patient reluctant to leave their home because of a fear of falling?  No     Home Environment   Living Environment Private residence   Type of [place] to enter   Entrance Stairs-Number of Steps 1   [place] One level   Home Equipment Wheelchair - manual;Cane - single point   Additional Comments  uses w/c to carry items     Prior Function   Level of Independence Independent   Vocation Full time employment   Vocation Requirements elementary school cafeteria cook - Possible return to work 03/24/17     Cognition   Overall Cognitive Status Within Functional Limits for tasks assessed     Observation/Other Assessments   Focus on Therapeutic Outcomes (FOTO)  Ankle: 26 (74% limited, predicted 54% limited)     Sensation   Light Touch Impaired by gross assessment   Additional Comments patient reporting slight numbness from mid shin down     Coordination   Gross Motor Movements are Fluid and Coordinated Yes     Functional Tests   Functional tests Single leg stance;Other;Other2     Single Leg Stance   Comments B UE support ~ 5 seconds     Other:   Other/ Comments modified tandem - B UE support to gain position ~30 seconds     Other:   Other/Comments narrow BOS - B UE support to attain  position - hold for 1 min with no UE support     Posture/Postural Control   Posture/Postural Control Postural limitations   Postural Limitations Rounded Shoulders;Forward head     ROM / Strength   AROM / PROM / Strength AROM;Strength     AROM   AROM Assessment Site Ankle   Right/Left Ankle Left   Left Ankle Dorsiflexion 6   Left Ankle Plantar Flexion 28   Left Ankle Inversion 10   Left Ankle Eversion 2     Strength   Strength Assessment Site Ankle   Right/Left Ankle Left   Left Ankle Dorsiflexion 3+/5   Left Ankle Plantar Flexion 3+/5   Left Ankle Inversion 3/5     Palpation   Palpation comment diffusely non-tender; only mild sensitivity     Ambulation/Gait   Ambulation/Gait Yes   Ambulation Distance (Feet) 100 Feet   Assistive device None   Gait Pattern Decreased stance time - left;Decreased dorsiflexion - left;Decreased weight shift to left;Antalgic   Ambulation Surface Level;Indoor   Gait velocity 1.97 feet/seconds            Objective measurements completed on examination: See above findings.          [facility] Adult PT Treatment/Exercise - 12/30/16 1413      Exercises   Exercises Ankle     Ankle Exercises: Supine   T-Band resisted PF/DF with yellow tband x 15 reps each   Other Supine Ankle Exercises L ankle ABCs to promote ROM                PT Education - 12/30/16 1806    Education provided Yes   Education Details exam findings, POC, HEP   Person(s) Educated Patient   Methods Explanation;Demonstration;Handout   Comprehension Verbalized understanding;Returned demonstration;Need further instruction          PT Short Term Goals - 12/30/16 1816      PT SHORT TERM GOAL #1   Title patient to be independent with initial HEP   Status New   Target Date 01/27/17           PT Long Term Goals - 12/30/16 1818      PT LONG TERM GOAL #1   Title Patient to be independent with advanced HEP   Status New   Target Date 02/24/17     PT  LONG TERM GOAL #2   Title patient to improve L ankle/foot/LE strength to >/=  4+/5 without pain   Status New   Target Date 02/24/17     PT LONG TERM GOAL #3   Title patient to demonstrate good heel to gait mechanics over various surfaces with no evidence of LOB   Status New   Target Date 02/24/17     PT LONG TERM GOAL #4   Title patient to report ability to ambulate/stand for >/=1 hour in order to allow for return to work   Status New   Target Date 02/24/17     PT LONG TERM GOAL #5   Title patient to demonstrate L SL stance for >/= 10 seconds   Status New   Target Date 02/24/17                Plan - 12/30/16 1809    Clinical Impression Statement Patient is a 66 y/o female presenting to [place] today s/p L ankle arthrodesis on 09/24/16. Patient today with abnormal gait pattern with slow gait speed. Patient also with reduced strength and AROM at L ankle. Initial HEP given today for ankle ROM and gentle strenghtening with good carryover. patinet to benefit from PT to address ROM, strength, gait, and other aspects of functional mobility to allow for improved mobility and quality of life.    History and Personal Factors relevant to plan of care: HTN, history of lung cancer    Clinical Presentation Evolving   Clinical Presentation due to: subtalar fusion, history of lung cancer, HTN, other MSK pain   Clinical Decision Making Moderate   Rehab Potential Good   PT Frequency 2x / week   PT Duration 8 weeks   PT Treatment/Interventions ADLs/Self Care Home Management;Cryotherapy;Electrical Stimulation;Iontophoresis 4mg /ml Dexamethasone;Moist Heat;Ultrasound;Neuromuscular re-education;Balance training;Therapeutic exercise;Therapeutic activities;Functional mobility training;Stair training;Gait training;Patient/family education;Manual techniques;Passive range of motion;Vasopneumatic Device;Taping;Dry needling;Scar mobilization   Consulted and Agree with Plan of Care Patient      Patient will  benefit from skilled therapeutic intervention in order to improve the following deficits and impairments:  Abnormal gait, Decreased activity tolerance, Decreased balance, Decreased range of motion, Decreased mobility, Decreased strength, Difficulty walking, Pain  Visit Diagnosis: Pain in left ankle and joints of left foot - Plan: PT plan of care cert/re-cert  Stiffness of left ankle, not elsewhere classified - Plan: PT plan of care cert/re-cert  Difficulty in walking, not elsewhere classified - Plan: PT plan of care cert/re-cert  Other abnormalities of gait and mobility - Plan: PT plan of care cert/re-cert  Muscle weakness (generalized) - Plan: PT plan of care cert/re-cert      G-Codes - 73/41/93 1820    Functional Assessment Tool Used (Outpatient Only) FOTO: 26 (74% limited)   Functional Limitation Changing and maintaining body position   Changing and Maintaining Body Position Current Status (X9024) At least 60 percent but less than 80 percent impaired, limited or restricted   Changing and Maintaining Body Position Goal Status (O9735) At least 40 percent but less than 60 percent impaired, limited or restricted       Problem List Patient Active Problem List   Diagnosis Date Noted  . Idiopathic chronic venous hypertension of both lower extremities with inflammation 06/29/2016  . Subtalar joint instability, left 06/29/2016  . Arthritis of right knee 01/30/2016  . Lower extremity edema 10/21/2015  . Leg cramps 08/21/2015  . Left ankle pain 04/09/2015  . Depression with anxiety 01/20/2015  . Aortic stenosis, mild 01/08/2015  . Morbid obesity ([place]) 01/08/2015  . Varicose veins 01/02/2015  . Snoring 12/06/2014  . Viral  gastroenteritis 04/06/2013  . Insect bite of ankle, right, infected 07/31/2012  . Primary cancer of right upper lobe of lung ([place]) 10/18/2011  . Hip pain 06/29/2011  . Pain in joint, lower leg 03/17/2009  . GASTROENTERITIS 07/11/2008  . PULMONARY NODULE, SOLITARY  04/18/2008  . LIVER FUNCTION TESTS, ABNORMAL, HX OF 04/01/2008  . B12 DEFICIENCY 11/13/2007  . COUGH 11/13/2007  . UNSPECIFIED VITAMIN D DEFICIENCY 11/07/2007  . TINEA PEDIS 07/07/2007  . LYMPHADENOPATHY 11/16/2006  . Hyperlipidemia 09/15/2006  . Essential hypertension 09/15/2006  . LOW BACK PAIN 09/15/2006     Lanney Gins, PT, DPT 12/30/16 6:24 PM   [facility] [address]  [place], [place], [address] Phone: [phone]   Fax:  [phone]  Name: Elizabeth [name] MRN: [id number] Date of Birth: May 26, 1950

## 2016-12-30 NOTE — Patient Instructions (Signed)
Alphabet   Place pillow under calf so foot does not rub. Moving foot and ankle only, trace the letters of the alphabet from A to Z. Repeat __2__ times. Do _2___ sessions per day.  Dorsiflexion: Resisted    Facing anchor, tubing around left foot, pull toward face.  Repeat __15__ times per set. Do __2__ sets per session.   Plantar Flexion With Band   With resistive band around forefoot, press foot down while maintaining resistance with arms. Hold __5__ seconds. Repeat _15___ times. Do __2__ sessions per day.

## 2016-12-31 NOTE — Telephone Encounter (Signed)
Labs have been ordered. unfortunatly we do not have access to [facility] orders. Patient can have hospital change ordered for them due to being a [facility] facility.

## 2016-12-31 NOTE — Telephone Encounter (Signed)
Patient would like to ensure lab orders have been placed at [facility] at [facility], please advise

## 2017-01-04 ENCOUNTER — Ambulatory Visit (HOSPITAL_BASED_OUTPATIENT_CLINIC_OR_DEPARTMENT_OTHER)
Admission: RE | Admit: 2017-01-04 | Discharge: 2017-01-04 | Disposition: A | Discharge status: Home / Self Care | Payer: Medicare Other | Source: Ambulatory Visit | Attending: Family Medicine | Admitting: Family Medicine

## 2017-01-04 ENCOUNTER — Encounter (HOSPITAL_BASED_OUTPATIENT_CLINIC_OR_DEPARTMENT_OTHER): Payer: Self-pay

## 2017-01-04 DIAGNOSIS — Z1231 Encounter for screening mammogram for malignant neoplasm of breast: Secondary | ICD-10-CM | POA: Insufficient documentation

## 2017-01-05 ENCOUNTER — Encounter (HOSPITAL_COMMUNITY): Payer: Self-pay

## 2017-01-05 ENCOUNTER — Ambulatory Visit (HOSPITAL_COMMUNITY)
Admission: RE | Admit: 2017-01-05 | Discharge: 2017-01-05 | Disposition: A | Discharge status: Home / Self Care | Payer: Medicare Other | Source: Ambulatory Visit | Attending: Adult Health | Admitting: Adult Health

## 2017-01-05 ENCOUNTER — Other Ambulatory Visit: Payer: Self-pay | Admitting: Family Medicine

## 2017-01-05 ENCOUNTER — Other Ambulatory Visit (HOSPITAL_BASED_OUTPATIENT_CLINIC_OR_DEPARTMENT_OTHER): Payer: Medicare Other

## 2017-01-05 DIAGNOSIS — K760 Fatty (change of) liver, not elsewhere classified: Secondary | ICD-10-CM | POA: Diagnosis not present

## 2017-01-05 DIAGNOSIS — Z85118 Personal history of other malignant neoplasm of bronchus and lung: Secondary | ICD-10-CM | POA: Diagnosis present

## 2017-01-05 DIAGNOSIS — I700 Atherosclerosis of aorta: Secondary | ICD-10-CM | POA: Insufficient documentation

## 2017-01-05 DIAGNOSIS — C3411 Malignant neoplasm of upper lobe, right bronchus or lung: Secondary | ICD-10-CM

## 2017-01-05 DIAGNOSIS — C3490 Malignant neoplasm of unspecified part of unspecified bronchus or lung: Secondary | ICD-10-CM | POA: Diagnosis not present

## 2017-01-05 DIAGNOSIS — I10 Essential (primary) hypertension: Secondary | ICD-10-CM | POA: Diagnosis not present

## 2017-01-05 DIAGNOSIS — E1169 Type 2 diabetes mellitus with other specified complication: Secondary | ICD-10-CM | POA: Diagnosis not present

## 2017-01-05 DIAGNOSIS — E785 Hyperlipidemia, unspecified: Secondary | ICD-10-CM | POA: Diagnosis not present

## 2017-01-05 LAB — CBC WITH DIFFERENTIAL/PLATELET
BASO%: 0.2 % (ref 0.0–2.0)
BASOS ABS: 0 10*3/uL (ref 0.0–0.1)
EOS ABS: 0.1 10*3/uL (ref 0.0–0.5)
EOS%: 1.6 % (ref 0.0–7.0)
HCT: 40.3 % (ref 34.8–46.6)
HEMOGLOBIN: 13 g/dL (ref 11.6–15.9)
LYMPH#: 2.4 10*3/uL (ref 0.9–3.3)
LYMPH%: 29.2 % (ref 14.0–49.7)
MCH: 30 pg (ref 25.1–34.0)
MCHC: 32.3 g/dL (ref 31.5–36.0)
MCV: 93.1 fL (ref 79.5–101.0)
MONO#: 0.6 10*3/uL (ref 0.1–0.9)
MONO%: 7.1 % (ref 0.0–14.0)
NEUT#: 5.2 10*3/uL (ref 1.5–6.5)
NEUT%: 61.9 % (ref 38.4–76.8)
Platelets: 202 10*3/uL (ref 145–400)
RBC: 4.33 10*6/uL (ref 3.70–5.45)
RDW: 13.5 % (ref 11.2–14.5)
WBC: 8.3 10*3/uL (ref 3.9–10.3)

## 2017-01-05 LAB — COMPREHENSIVE METABOLIC PANEL
ALT: 13 U/L (ref 0–55)
AST: 20 U/L (ref 5–34)
Albumin: 3.7 g/dL (ref 3.5–5.0)
Alkaline Phosphatase: 80 U/L (ref 40–150)
Anion Gap: 8 mEq/L (ref 3–11)
BILIRUBIN TOTAL: 0.67 mg/dL (ref 0.20–1.20)
BUN: 10.2 mg/dL (ref 7.0–26.0)
CHLORIDE: 104 meq/L (ref 98–109)
CO2: 31 meq/L — AB (ref 22–29)
CREATININE: 0.9 mg/dL (ref 0.6–1.1)
Calcium: 10.2 mg/dL (ref 8.4–10.4)
EGFR: 64 mL/min/{1.73_m2} — ABNORMAL LOW (ref >90)
GLUCOSE: 99 mg/dL (ref 70–140)
Potassium: 4.4 mEq/L (ref 3.5–5.1)
Sodium: 143 mEq/L (ref 136–145)
Total Protein: 7.6 g/dL (ref 6.4–8.3)

## 2017-01-06 ENCOUNTER — Ambulatory Visit: Payer: Medicare Other | Attending: Orthopedic Surgery

## 2017-01-06 ENCOUNTER — Ambulatory Visit: Payer: Medicare Other | Admitting: Cardiovascular Disease

## 2017-01-06 DIAGNOSIS — M25672 Stiffness of left ankle, not elsewhere classified: Secondary | ICD-10-CM | POA: Diagnosis not present

## 2017-01-06 DIAGNOSIS — M6281 Muscle weakness (generalized): Secondary | ICD-10-CM | POA: Insufficient documentation

## 2017-01-06 DIAGNOSIS — R2689 Other abnormalities of gait and mobility: Secondary | ICD-10-CM | POA: Insufficient documentation

## 2017-01-06 DIAGNOSIS — R262 Difficulty in walking, not elsewhere classified: Secondary | ICD-10-CM | POA: Diagnosis not present

## 2017-01-06 DIAGNOSIS — M25572 Pain in left ankle and joints of left foot: Secondary | ICD-10-CM | POA: Insufficient documentation

## 2017-01-06 NOTE — Therapy (Signed)
[facility] [address]  [place], [place], [address] Phone: [phone]   Fax:  [phone]  Physical Therapy Treatment  Patient Details  Name: Elizabeth [name] MRN: [id number] Date of Birth: [birth year] Referring Provider: Dr. Bernarda Caffey  Encounter Date: 01/06/2017      PT End of Session - 01/06/17 1422    Visit Number 2   Number of Visits 16   Date for PT Re-Evaluation 02/24/17   Authorization Type Medicare   PT Start Time 1540  pt. arrived late    PT Stop Time 1455   PT Time Calculation (min) 48 min   Activity Tolerance Patient tolerated treatment well   Behavior During Therapy [facility] for tasks assessed/performed      Past Medical History:  Diagnosis Date  . Aortic stenosis, mild 01/08/2015  . Cancer ([place]) lung    lung  . Hx of cancer of lung 6/10   Removed June 2010  . Hyperlipidemia   . Hypertension   . Low back pain   . Morbid obesity ([place]) 01/08/2015  . Vitamin D deficiency     Past Surgical History:  Procedure Laterality Date  . CHOLECYSTECTOMY    . LUNG REMOVAL, PARTIAL    . TUBAL LIGATION      There were no vitals filed for this visit.      Subjective Assessment - 01/06/17 1409    Subjective Pt. noting hasn't been able to get to HEP due to father getting in car accident last Saturday.     Patient Stated Goals improve balance and ability to walk   Currently in Pain? Yes   Pain Score 6    Pain Location Ankle   Pain Orientation Left   Pain Descriptors / Indicators Aching   Pain Type Surgical pain   Pain Onset More than a month ago   Pain Frequency Intermittent   Aggravating Factors  wt. bearing   Pain Relieving Factors rest   Effect of Pain on Daily Activities walking and standing less    Multiple Pain Sites No                         [facility] Adult PT Treatment/Exercise - 01/06/17 1431      Modalities   Modalities Vasopneumatic     Vasopneumatic   Number  Minutes Vasopneumatic  10 minutes   Vasopnuematic Location  Ankle  L   Vasopneumatic Pressure Medium   Vasopneumatic Temperature  coldest temp. with pillow case      Ankle Exercises: Seated   ABC's 2 reps  L    Ankle Circles/Pumps 10 reps;Left   BAPS Level 2;15 reps;Sitting  DF/PF, IV/EV, CW/CCW   Other Seated Ankle Exercises Seated L ankle PF with red looped TB (pt. holding band) 2 x 15 reps   Other Seated Ankle Exercises Sit<>stand from mat table x 5 reps      Ankle Exercises: Aerobic   Stationary Bike NuStep: lvl 1, 6 min      Ankle Exercises: Standing   Other Standing Ankle Exercises standing side<>side weight shift x 10 reps each side; light UE support on chair                  PT Short Term Goals - 01/06/17 1427      PT SHORT TERM GOAL #1   Title patient to be independent with initial HEP   Status On-going  PT Long Term Goals - 01/06/17 1427      PT LONG TERM GOAL #1   Title Patient to be independent with advanced HEP   Status On-going     PT LONG TERM GOAL #2   Title patient to improve L ankle/foot/LE strength to >/= 4+/5 without pain   Status On-going     PT LONG TERM GOAL #3   Title patient to demonstrate good heel to gait mechanics over various surfaces with no evidence of LOB   Status On-going     PT LONG TERM GOAL #4   Title patient to report ability to ambulate/stand for >/=1 hour in order to allow for return to work   Status On-going     PT LONG TERM GOAL #5   Title patient to demonstrate L SL stance for >/= 10 seconds   Status On-going               Plan - 01/06/17 1419    Clinical Impression Statement Pt. arrived late thus treatment time limited.  No issues with HEP.  Good overall technique however some cueing to use full available ROM.  Tolerated gentle ROM and BAPS board activities well today.  Initiated conservative weight shift on firm surface.  Treatment ended with ice/compression to L ankle to promote reduction  in edema/pain.   PT Treatment/Interventions ADLs/Self Care Home Management;Cryotherapy;Electrical Stimulation;Iontophoresis 4mg /ml Dexamethasone;Moist Heat;Ultrasound;Neuromuscular re-education;Balance training;Therapeutic exercise;Therapeutic activities;Functional mobility training;Stair training;Gait training;Patient/family education;Manual techniques;Passive range of motion;Vasopneumatic Device;Taping;Dry needling;Scar mobilization      Patient will benefit from skilled therapeutic intervention in order to improve the following deficits and impairments:  Abnormal gait, Decreased activity tolerance, Decreased balance, Decreased range of motion, Decreased mobility, Decreased strength, Difficulty walking, Pain  Visit Diagnosis: Pain in left ankle and joints of left foot  Stiffness of left ankle, not elsewhere classified  Difficulty in walking, not elsewhere classified  Other abnormalities of gait and mobility  Muscle weakness (generalized)     Problem List Patient Active Problem List   Diagnosis Date Noted  . Idiopathic chronic venous hypertension of both lower extremities with inflammation 06/29/2016  . Subtalar joint instability, left 06/29/2016  . Arthritis of right knee 01/30/2016  . Lower extremity edema 10/21/2015  . Leg cramps 08/21/2015  . Left ankle pain 04/09/2015  . Depression with anxiety 01/20/2015  . Aortic stenosis, mild 01/08/2015  . Morbid obesity ([place]) 01/08/2015  . Varicose veins 01/02/2015  . Snoring 12/06/2014  . Viral gastroenteritis 04/06/2013  . Insect bite of ankle, right, infected 07/31/2012  . Primary cancer of right upper lobe of lung ([place]) 10/18/2011  . Hip pain 06/29/2011  . Pain in joint, lower leg 03/17/2009  . GASTROENTERITIS 07/11/2008  . PULMONARY NODULE, SOLITARY 04/18/2008  . LIVER FUNCTION TESTS, ABNORMAL, HX OF 04/01/2008  . B12 DEFICIENCY 11/13/2007  . COUGH 11/13/2007  . UNSPECIFIED VITAMIN D DEFICIENCY 11/07/2007  . TINEA PEDIS  07/07/2007  . LYMPHADENOPATHY 11/16/2006  . Hyperlipidemia 09/15/2006  . Essential hypertension 09/15/2006  . LOW BACK PAIN 09/15/2006    Bess Harvest, PTA 01/06/17 6:50 PM  [facility] [address]  [place], [place], [address] Phone: [phone]   Fax:  [phone]  Name: Elizabeth [name] MRN: [id number] Date of Birth: Aug 23, 1950

## 2017-01-07 ENCOUNTER — Telehealth: Payer: Self-pay | Admitting: *Deleted

## 2017-01-07 NOTE — Telephone Encounter (Signed)
Received letter to inform provider that [facility] drew blood for: Lipid panel, TSH, Hgb A1C; results will be faxed directly to provider from [facility]; forwarded to provider/SLS 09/07

## 2017-01-10 ENCOUNTER — Ambulatory Visit: Payer: Medicare Other | Admitting: Physical Therapy

## 2017-01-10 DIAGNOSIS — M6281 Muscle weakness (generalized): Secondary | ICD-10-CM | POA: Diagnosis not present

## 2017-01-10 DIAGNOSIS — R262 Difficulty in walking, not elsewhere classified: Secondary | ICD-10-CM | POA: Diagnosis not present

## 2017-01-10 DIAGNOSIS — M25672 Stiffness of left ankle, not elsewhere classified: Secondary | ICD-10-CM | POA: Diagnosis not present

## 2017-01-10 DIAGNOSIS — R2689 Other abnormalities of gait and mobility: Secondary | ICD-10-CM

## 2017-01-10 DIAGNOSIS — M25572 Pain in left ankle and joints of left foot: Secondary | ICD-10-CM | POA: Diagnosis not present

## 2017-01-10 LAB — TSH: TSH: 2.99 u[IU]/mL (ref 0.450–4.500)

## 2017-01-10 LAB — HGB A1C W/O EAG: HEMOGLOBIN A1C: 5.5 % (ref 4.8–5.6)

## 2017-01-10 LAB — LIPID PANEL W/O CHOL/HDL RATIO
Cholesterol, Total: 164 mg/dL (ref 100–199)
HDL: 44 mg/dL (ref >39)
LDL Calculated: 99 mg/dL (ref 0–99)
TRIGLYCERIDES: 106 mg/dL (ref 0–149)
VLDL Cholesterol Cal: 21 mg/dL (ref 5–40)

## 2017-01-10 NOTE — Therapy (Signed)
[facility] [address]  [place], [place], [address] Phone: [phone]   Fax:  [phone]  Physical Therapy Treatment  Patient Details  Name: Elizabeth [name] MRN: [id number] Date of Birth: May 10, 1950 Referring Provider: Dr. Bernarda Caffey  Encounter Date: 01/10/2017      PT End of Session - 01/10/17 1329    Visit Number 3   Number of Visits 16   Date for PT Re-Evaluation 02/24/17   Authorization Type Medicare   PT Start Time 1325  patient late   PT Stop Time 1415   PT Time Calculation (min) 50 min   Activity Tolerance Patient tolerated treatment well   Behavior During Therapy [facility] for tasks assessed/performed      Past Medical History:  Diagnosis Date  . Aortic stenosis, mild 01/08/2015  . Cancer ([place]) lung    lung  . Hx of cancer of lung 6/10   Removed June 2010  . Hyperlipidemia   . Hypertension   . Low back pain   . Morbid obesity ([place]) 01/08/2015  . Vitamin D deficiency     Past Surgical History:  Procedure Laterality Date  . CHOLECYSTECTOMY    . LUNG REMOVAL, PARTIAL    . TUBAL LIGATION      There were no vitals filed for this visit.      Subjective Assessment - 01/10/17 1328    Subjective Had to take pain meds after last visit.    Diagnostic tests Xray - normal healing   Patient Stated Goals improve balance and ability to walk   Currently in Pain? Yes   Pain Score 3    Pain Location Ankle   Pain Orientation Left   Pain Descriptors / Indicators Aching;Sore   Pain Type Surgical pain                         [facility] Adult PT Treatment/Exercise - 01/10/17 1330      Modalities   Modalities Vasopneumatic     Vasopneumatic   Number Minutes Vasopneumatic  15 minutes   Vasopnuematic Location  Ankle   Vasopneumatic Pressure Medium   Vasopneumatic Temperature  coldest temp. with pillow case      Manual Therapy   Manual Therapy Soft tissue mobilization   Soft tissue  mobilization STM to R ankle complex (anterior, posterior, and lateral)     Ankle Exercises: Aerobic   Stationary Bike NuStep: lvl 4, 6 min      Ankle Exercises: Seated   Other Seated Ankle Exercises foot intrinsic: yellow tband x 15 reps (toe curl, ankle PF, ankle DF, toe uncurl)                  PT Short Term Goals - 01/06/17 1427      PT SHORT TERM GOAL #1   Title patient to be independent with initial HEP   Status On-going           PT Long Term Goals - 01/06/17 1427      PT LONG TERM GOAL #1   Title Patient to be independent with advanced HEP   Status On-going     PT LONG TERM GOAL #2   Title patient to improve L ankle/foot/LE strength to >/= 4+/5 without pain   Status On-going     PT LONG TERM GOAL #3   Title patient to demonstrate good heel to gait mechanics over various surfaces with no  evidence of LOB   Status On-going     PT LONG TERM GOAL #4   Title patient to report ability to ambulate/stand for >/=1 hour in order to allow for return to work   Status On-going     PT [place] #5   Title patient to demonstrate L SL stance for >/= 10 seconds   Status On-going               Plan - 01/10/17 1330    Clinical Impression Statement Patient reporting need to take pain medications both after last session and last night upon completion of HEP - feels like "scabs" are moving, however, no evidence of open wound or dehissence of incision site. PT session today focusing heavily on manual for hopeful pain reduction to allow aptient to continue to progress towards goals.   PT Treatment/Interventions ADLs/Self Care Home Management;Cryotherapy;Electrical Stimulation;Iontophoresis 4mg /ml Dexamethasone;Moist Heat;Ultrasound;Neuromuscular re-education;Balance training;Therapeutic exercise;Therapeutic activities;Functional mobility training;Stair training;Gait training;Patient/family education;Manual techniques;Passive range of motion;Vasopneumatic  Device;Taping;Dry needling;Scar mobilization   Consulted and Agree with Plan of Care Patient      Patient will benefit from skilled therapeutic intervention in order to improve the following deficits and impairments:  Abnormal gait, Decreased activity tolerance, Decreased balance, Decreased range of motion, Decreased mobility, Decreased strength, Difficulty walking, Pain  Visit Diagnosis: Pain in left ankle and joints of left foot  Stiffness of left ankle, not elsewhere classified  Difficulty in walking, not elsewhere classified  Other abnormalities of gait and mobility  Muscle weakness (generalized)     Problem List Patient Active Problem List   Diagnosis Date Noted  . Idiopathic chronic venous hypertension of both lower extremities with inflammation 06/29/2016  . Subtalar joint instability, left 06/29/2016  . Arthritis of right knee 01/30/2016  . Lower extremity edema 10/21/2015  . Leg cramps 08/21/2015  . Left ankle pain 04/09/2015  . Depression with anxiety 01/20/2015  . Aortic stenosis, mild 01/08/2015  . Morbid obesity ([place]) 01/08/2015  . Varicose veins 01/02/2015  . Snoring 12/06/2014  . Viral gastroenteritis 04/06/2013  . Insect bite of ankle, right, infected 07/31/2012  . Primary cancer of right upper lobe of lung ([place]) 10/18/2011  . Hip pain 06/29/2011  . Pain in joint, lower leg 03/17/2009  . GASTROENTERITIS 07/11/2008  . PULMONARY NODULE, SOLITARY 04/18/2008  . LIVER FUNCTION TESTS, ABNORMAL, HX OF 04/01/2008  . B12 DEFICIENCY 11/13/2007  . COUGH 11/13/2007  . UNSPECIFIED VITAMIN D DEFICIENCY 11/07/2007  . TINEA PEDIS 07/07/2007  . LYMPHADENOPATHY 11/16/2006  . Hyperlipidemia 09/15/2006  . Essential hypertension 09/15/2006  . LOW BACK PAIN 09/15/2006     Lanney Gins, PT, DPT 01/10/17 3:10 PM   [facility] [address]  [place], [place], [address] Phone: [phone]   Fax:   [phone]  Name: SHAQUILLE MURDY MRN: [id number] Date of Birth: 01/24/1951

## 2017-01-13 ENCOUNTER — Ambulatory Visit: Payer: Medicare Other | Admitting: Physical Therapy

## 2017-01-14 ENCOUNTER — Encounter: Payer: Self-pay | Admitting: Adult Health

## 2017-01-18 ENCOUNTER — Ambulatory Visit: Payer: Medicare Other

## 2017-01-18 DIAGNOSIS — M6281 Muscle weakness (generalized): Secondary | ICD-10-CM

## 2017-01-18 DIAGNOSIS — M25672 Stiffness of left ankle, not elsewhere classified: Secondary | ICD-10-CM | POA: Diagnosis not present

## 2017-01-18 DIAGNOSIS — M25572 Pain in left ankle and joints of left foot: Secondary | ICD-10-CM | POA: Diagnosis not present

## 2017-01-18 DIAGNOSIS — R262 Difficulty in walking, not elsewhere classified: Secondary | ICD-10-CM | POA: Diagnosis not present

## 2017-01-18 DIAGNOSIS — R2689 Other abnormalities of gait and mobility: Secondary | ICD-10-CM | POA: Diagnosis not present

## 2017-01-18 NOTE — Therapy (Signed)
[facility] [address]  [place], [place], [address] Phone: [phone]   Fax:  [phone]  Physical Therapy Treatment  Patient Details  Name: Elizabeth [name] MRN: [id number] Date of Birth: 1950-08-08 Referring Provider: Dr. Bernarda Caffey  Encounter Date: 01/18/2017      PT End of Session - 01/18/17 1541    Visit Number 4   Number of Visits 16   Date for PT Re-Evaluation 02/24/17   Authorization Type Medicare   PT Start Time 1534   PT Stop Time 1624   PT Time Calculation (min) 50 min   Activity Tolerance Patient tolerated treatment well   Behavior During Therapy [facility] for tasks assessed/performed      Past Medical History:  Diagnosis Date  . Aortic stenosis, mild 01/08/2015  . Cancer ([place]) lung    lung  . Hx of cancer of lung 6/10   Removed June 2010  . Hyperlipidemia   . Hypertension   . Low back pain   . Morbid obesity ([place]) 01/08/2015  . Vitamin D deficiency     Past Surgical History:  Procedure Laterality Date  . CHOLECYSTECTOMY    . LUNG REMOVAL, PARTIAL    . TUBAL LIGATION      There were no vitals filed for this visit.      Subjective Assessment - 01/18/17 1535    Subjective Pt. noting she has been having increased pain in ankle over weekend which is worst at night.     Patient Stated Goals improve balance and ability to walk   Currently in Pain? Yes   Pain Score 3    Pain Location Ankle   Pain Orientation Left;Lateral   Pain Descriptors / Indicators Aching   Pain Type Surgical pain   Pain Onset More than a month ago   Pain Frequency Intermittent   Aggravating Factors  Wt. bearing    Pain Relieving Factors resting, "pain pills"   Multiple Pain Sites No                         [facility] Adult PT Treatment/Exercise - 01/18/17 1544      Ambulation/Gait   Ambulation/Gait Yes   Ambulation/Gait Assistance 5: Supervision   Ambulation Distance (Feet) --  2 x 20 ft    Assistive device Straight cane   Gait Pattern Decreased stance time - left;Decreased dorsiflexion - left;Decreased weight shift to left;Antalgic   Gait Comments Pt. ambulating with SPC 2 x 20 ft; Pt. verbalizing increased stability with SPC and encouraged by therapist to purchase for home and community use     Modalities   Modalities Cryotherapy     Cryotherapy   Number Minutes Cryotherapy 10 Minutes   Cryotherapy Location Ankle  L    Type of Cryotherapy Ice pack     Manual Therapy   Manual Therapy Soft tissue mobilization   Manual therapy comments supine with L LE elevated    Soft tissue mobilization STM to R ankle complex anterior, posterior, and lateral     Ankle Exercises: Seated   ABC's 2 reps  L   Other Seated Ankle Exercises foot intrinsic: yellow tband x 15 reps (toe curl, ankle PF, ankle DF, toe uncurl)     Ankle Exercises: Aerobic   Stationary Bike NuStep: lvl 4, 6 min                   PT Short  Term Goals - 01/06/17 1427      PT SHORT TERM GOAL #1   Title patient to be independent with initial HEP   Status On-going           PT Long Term Goals - 01/06/17 1427      PT LONG TERM GOAL #1   Title Patient to be independent with advanced HEP   Status On-going     PT LONG TERM GOAL #2   Title patient to improve L ankle/foot/LE strength to >/= 4+/5 without pain   Status On-going     PT LONG TERM GOAL #3   Title patient to demonstrate good heel to gait mechanics over various surfaces with no evidence of LOB   Status On-going     PT LONG TERM GOAL #4   Title patient to report ability to ambulate/stand for >/=1 hour in order to allow for return to work   Status On-going     PT LONG TERM GOAL #5   Title patient to demonstrate L SL stance for >/= 10 seconds   Status On-going               Plan - 01/18/17 1541    Clinical Impression Statement [name] noting increase L ankle pain over weekend, which is worst at nights.  Seen entering therapy  ambulating without AD.  Pt. encouraged to purchase [facility] today and provided SPC cane with ambulation in therapy.  Pt. verbalizing improved tolerance for ambulating with SPC and plans to purchase this.  Tolerated all low-level strengthening and ROM activities at L ankle well today.  Treatment ending with ice to L ankle to reduce post-activity swelling and pain.     PT Treatment/Interventions ADLs/Self Care Home Management;Cryotherapy;Electrical Stimulation;Iontophoresis 4mg /ml Dexamethasone;Moist Heat;Ultrasound;Neuromuscular re-education;Balance training;Therapeutic exercise;Therapeutic activities;Functional mobility training;Stair training;Gait training;Patient/family education;Manual techniques;Passive range of motion;Vasopneumatic Device;Taping;Dry needling;Scar mobilization      Patient will benefit from skilled therapeutic intervention in order to improve the following deficits and impairments:  Abnormal gait, Decreased activity tolerance, Decreased balance, Decreased range of motion, Decreased mobility, Decreased strength, Difficulty walking, Pain  Visit Diagnosis: Pain in left ankle and joints of left foot  Stiffness of left ankle, not elsewhere classified  Difficulty in walking, not elsewhere classified  Other abnormalities of gait and mobility  Muscle weakness (generalized)     Problem List Patient Active Problem List   Diagnosis Date Noted  . Idiopathic chronic venous hypertension of both lower extremities with inflammation 06/29/2016  . Subtalar joint instability, left 06/29/2016  . Arthritis of right knee 01/30/2016  . Lower extremity edema 10/21/2015  . Leg cramps 08/21/2015  . Left ankle pain 04/09/2015  . Depression with anxiety 01/20/2015  . Aortic stenosis, mild 01/08/2015  . Morbid obesity ([place]) 01/08/2015  . Varicose veins 01/02/2015  . Snoring 12/06/2014  . Viral gastroenteritis 04/06/2013  . Insect bite of ankle, right, infected 07/31/2012  . Primary cancer of  right upper lobe of lung ([place]) 10/18/2011  . Hip pain 06/29/2011  . Pain in joint, lower leg 03/17/2009  . GASTROENTERITIS 07/11/2008  . PULMONARY NODULE, SOLITARY 04/18/2008  . LIVER FUNCTION TESTS, ABNORMAL, HX OF 04/01/2008  . B12 DEFICIENCY 11/13/2007  . COUGH 11/13/2007  . UNSPECIFIED VITAMIN D DEFICIENCY 11/07/2007  . TINEA PEDIS 07/07/2007  . LYMPHADENOPATHY 11/16/2006  . Hyperlipidemia 09/15/2006  . Essential hypertension 09/15/2006  . LOW BACK PAIN 09/15/2006    Bess Harvest, PTA 01/18/17 5:49 PM  [facility] [phone]  [address]  [place], [place], [address] Phone: [phone]   Fax:  [phone]  Name: SORAIDA VICKERS MRN: [id number] Date of Birth: August 30, 1950

## 2017-01-19 ENCOUNTER — Ambulatory Visit: Payer: Medicare Other

## 2017-01-20 ENCOUNTER — Ambulatory Visit: Payer: Medicare Other | Admitting: Physical Therapy

## 2017-01-24 ENCOUNTER — Ambulatory Visit: Payer: Medicare Other | Admitting: Physical Therapy

## 2017-01-24 DIAGNOSIS — R262 Difficulty in walking, not elsewhere classified: Secondary | ICD-10-CM

## 2017-01-24 DIAGNOSIS — M25672 Stiffness of left ankle, not elsewhere classified: Secondary | ICD-10-CM

## 2017-01-24 DIAGNOSIS — M25572 Pain in left ankle and joints of left foot: Secondary | ICD-10-CM | POA: Diagnosis not present

## 2017-01-24 DIAGNOSIS — M6281 Muscle weakness (generalized): Secondary | ICD-10-CM | POA: Diagnosis not present

## 2017-01-24 DIAGNOSIS — R2689 Other abnormalities of gait and mobility: Secondary | ICD-10-CM

## 2017-01-24 NOTE — Therapy (Signed)
[facility] [address]  [place], [place], [address] Phone: [phone]   Fax:  [phone]  Physical Therapy Treatment  Patient Details  Name: Elizabeth [name] MRN: [id number] Date of Birth: [birth year] Referring Provider: Dr. Bernarda Caffey  Encounter Date: 01/24/2017      PT End of Session - 01/24/17 1326    Visit Number 5   Number of Visits 16   Date for PT Re-Evaluation 02/24/17   Authorization Type Medicare   PT Start Time 1323  pt late   PT Stop Time 1415   PT Time Calculation (min) 52 min   Activity Tolerance Patient tolerated treatment well   Behavior During Therapy [facility] for tasks assessed/performed      Past Medical History:  Diagnosis Date  . Aortic stenosis, mild 01/08/2015  . Cancer ([place]) lung    lung  . Hx of cancer of lung 6/10   Removed June 2010  . Hyperlipidemia   . Hypertension   . Low back pain   . Morbid obesity ([place]) 01/08/2015  . Vitamin D deficiency     Past Surgical History:  Procedure Laterality Date  . CHOLECYSTECTOMY    . LUNG REMOVAL, PARTIAL    . TUBAL LIGATION      There were no vitals filed for this visit.      Subjective Assessment - 01/24/17 1325    Subjective Pain still worse at night; some pain this morning - has not been taking any pain meds   Diagnostic tests Xray - normal healing   Patient Stated Goals improve balance and ability to walk   Currently in Pain? Yes   Pain Score 4    Pain Location Ankle   Pain Orientation Left   Pain Descriptors / Indicators Aching;Sore   Pain Type Surgical pain                         [facility] Adult PT Treatment/Exercise - 01/24/17 1327      Ambulation/Gait   Ambulation/Gait Yes   Ambulation/Gait Assistance 5: Supervision   Ambulation Distance (Feet) 100 Feet   Assistive device Straight cane   Gait Comments VC for stride length and gait pattern with AD     Vasopneumatic   Number Minutes Vasopneumatic   15 minutes   Vasopnuematic Location  Ankle   Vasopneumatic Pressure Medium   Vasopneumatic Temperature  coldest temp     Ankle Exercises: Aerobic   Stationary Bike NuStep: lvl 5, 6 min      Ankle Exercises: Standing   SLS L SLS on foam x 30 sec -  B UE support   Heel Raises 15 reps   Heel Raises Limitations B UE support - "its exhausting"   Toe Raise 15 reps   Other Standing Ankle Exercises narrow BOS - standing on foam 2 x 60 sec     Ankle Exercises: Seated   BAPS Sitting;Level 3;15 reps  fwd/bwd; inv/ev, CW - L foot only                  PT Short Term Goals - 01/06/17 1427      PT SHORT TERM GOAL #1   Title patient to be independent with initial HEP   Status On-going           PT Long Term Goals - 01/06/17 1427      PT LONG TERM GOAL #1   Title  Patient to be independent with advanced HEP   Status On-going     PT LONG TERM GOAL #2   Title patient to improve L ankle/foot/LE strength to >/= 4+/5 without pain   Status On-going     PT LONG TERM GOAL #3   Title patient to demonstrate good heel to gait mechanics over various surfaces with no evidence of LOB   Status On-going     PT LONG TERM GOAL #4   Title patient to report ability to ambulate/stand for >/=1 hour in order to allow for return to work   Status On-going     PT LONG TERM GOAL #5   Title patient to demonstrate L SL stance for >/= 10 seconds   Status On-going               Plan - 01/24/17 1326    Clinical Impression Statement Patient acquiring [facility] since last visit from recommendations from PTA. Gait training today with SPC for gait pattern as well as good stride length with good carryover. Patient able to begin incorporating more weight bearing activities in session with good tolerance, however, does fatigue rather quickly. Will continue to progress as tolerated.    PT Treatment/Interventions ADLs/Self Care Home Management;Cryotherapy;Electrical Stimulation;Iontophoresis 4mg /ml  Dexamethasone;Moist Heat;Ultrasound;Neuromuscular re-education;Balance training;Therapeutic exercise;Therapeutic activities;Functional mobility training;Stair training;Gait training;Patient/family education;Manual techniques;Passive range of motion;Vasopneumatic Device;Taping;Dry needling;Scar mobilization   Consulted and Agree with Plan of Care Patient      Patient will benefit from skilled therapeutic intervention in order to improve the following deficits and impairments:  Abnormal gait, Decreased activity tolerance, Decreased balance, Decreased range of motion, Decreased mobility, Decreased strength, Difficulty walking, Pain  Visit Diagnosis: Pain in left ankle and joints of left foot  Stiffness of left ankle, not elsewhere classified  Difficulty in walking, not elsewhere classified  Other abnormalities of gait and mobility  Muscle weakness (generalized)     Problem List Patient Active Problem List   Diagnosis Date Noted  . Idiopathic chronic venous hypertension of both lower extremities with inflammation 06/29/2016  . Subtalar joint instability, left 06/29/2016  . Arthritis of right knee 01/30/2016  . Lower extremity edema 10/21/2015  . Leg cramps 08/21/2015  . Left ankle pain 04/09/2015  . Depression with anxiety 01/20/2015  . Aortic stenosis, mild 01/08/2015  . Morbid obesity ([place]) 01/08/2015  . Varicose veins 01/02/2015  . Snoring 12/06/2014  . Viral gastroenteritis 04/06/2013  . Insect bite of ankle, right, infected 07/31/2012  . Primary cancer of right upper lobe of lung ([place]) 10/18/2011  . Hip pain 06/29/2011  . Pain in joint, lower leg 03/17/2009  . GASTROENTERITIS 07/11/2008  . PULMONARY NODULE, SOLITARY 04/18/2008  . LIVER FUNCTION TESTS, ABNORMAL, HX OF 04/01/2008  . B12 DEFICIENCY 11/13/2007  . COUGH 11/13/2007  . UNSPECIFIED VITAMIN D DEFICIENCY 11/07/2007  . TINEA PEDIS 07/07/2007  . LYMPHADENOPATHY 11/16/2006  . Hyperlipidemia 09/15/2006  .  Essential hypertension 09/15/2006  . LOW BACK PAIN 09/15/2006     Lanney Gins, PT, DPT 01/24/17 5:09 PM   [facility] [address]  [place], [place], [address] Phone: [phone]   Fax:  [phone]  Name: STEPHANNE GREELEY MRN: [id number] Date of Birth: 07-Feb-1951

## 2017-01-27 ENCOUNTER — Ambulatory Visit: Payer: Medicare Other | Admitting: Physical Therapy

## 2017-01-27 DIAGNOSIS — M25672 Stiffness of left ankle, not elsewhere classified: Secondary | ICD-10-CM | POA: Diagnosis not present

## 2017-01-27 DIAGNOSIS — R262 Difficulty in walking, not elsewhere classified: Secondary | ICD-10-CM

## 2017-01-27 DIAGNOSIS — M25572 Pain in left ankle and joints of left foot: Secondary | ICD-10-CM | POA: Diagnosis not present

## 2017-01-27 DIAGNOSIS — M6281 Muscle weakness (generalized): Secondary | ICD-10-CM

## 2017-01-27 DIAGNOSIS — R2689 Other abnormalities of gait and mobility: Secondary | ICD-10-CM | POA: Diagnosis not present

## 2017-01-27 NOTE — Therapy (Signed)
[facility] [address]  [place], [place], [address] Phone: [phone]   Fax:  [phone]  Physical Therapy Treatment  Patient Details  Name: Elizabeth [name] MRN: [id number] Date of Birth: 10-25-1950 Referring Provider: Dr. Bernarda Caffey  Encounter Date: 01/27/2017      PT End of Session - 01/27/17 1647    Visit Number 6   Number of Visits 16   Date for PT Re-Evaluation 02/24/17   Authorization Type Medicare   PT Start Time 1324  pt late   PT Stop Time 1411   PT Time Calculation (min) 47 min   Activity Tolerance Patient tolerated treatment well   Behavior During Therapy [facility] for tasks assessed/performed      Past Medical History:  Diagnosis Date  . Aortic stenosis, mild 01/08/2015  . Cancer ([place]) lung    lung  . Hx of cancer of lung 6/10   Removed June 2010  . Hyperlipidemia   . Hypertension   . Low back pain   . Morbid obesity ([place]) 01/08/2015  . Vitamin D deficiency     Past Surgical History:  Procedure Laterality Date  . CHOLECYSTECTOMY    . LUNG REMOVAL, PARTIAL    . TUBAL LIGATION      There were no vitals filed for this visit.      Subjective Assessment - 01/27/17 1332    Subjective feels like she is struggling today - difficult walking in from parking lot   Diagnostic tests Xray - normal healing   Patient Stated Goals improve balance and ability to walk   Currently in Pain? Yes   Pain Score 3    Pain Location Ankle   Pain Orientation Left   Pain Descriptors / Indicators Aching;Sore   Pain Type Surgical pain                         [facility] Adult PT Treatment/Exercise - 01/27/17 0001      Vasopneumatic   Number Minutes Vasopneumatic  15 minutes   Vasopnuematic Location  Ankle   Vasopneumatic Pressure Medium   Vasopneumatic Temperature  coldest temp     Ankle Exercises: Aerobic   Stationary Bike NuStep: lvl 5, 6 min      Ankle Exercises: Standing   SLS L SLS  - R foot on pebble 3 x 30 sec - B UE support   Other Standing Ankle Exercises hihg knee marching on rebounder x 15 each side; heel raises on rebounder x 15   Other Standing Ankle Exercises step up and over (fwd/bwd) on AirEx x 10     Ankle Exercises: Seated   Other Seated Ankle Exercises 4 way ankle resist - red tband x 15 each     Ankle Exercises: Stretches   Other Stretch L HS stretch 3 x 30 sec                  PT Short Term Goals - 01/06/17 1427      PT SHORT TERM GOAL #1   Title patient to be independent with initial HEP   Status On-going           PT Long Term Goals - 01/06/17 1427      PT LONG TERM GOAL #1   Title Patient to be independent with advanced HEP   Status On-going     PT LONG TERM GOAL #2   Title patient to  improve L ankle/foot/LE strength to >/= 4+/5 without pain   Status On-going     PT LONG TERM GOAL #3   Title patient to demonstrate good heel to gait mechanics over various surfaces with no evidence of LOB   Status On-going     PT LONG TERM GOAL #4   Title patient to report ability to ambulate/stand for >/=1 hour in order to allow for return to work   Status On-going     PT LONG TERM GOAL #5   Title patient to demonstrate L SL stance for >/= 10 seconds   Status On-going               Plan - 01/27/17 1648    Clinical Impression Statement Patient late, limiting treatment session. PAtient doing well with all strengthening in both weight bearing and non-weight bearing activities, as well as over compliant surfaces. Does require intermittent seated rest breaks due to pain at both L ankle and R knee.    PT Treatment/Interventions ADLs/Self Care Home Management;Cryotherapy;Electrical Stimulation;Iontophoresis 4mg /ml Dexamethasone;Moist Heat;Ultrasound;Neuromuscular re-education;Balance training;Therapeutic exercise;Therapeutic activities;Functional mobility training;Stair training;Gait training;Patient/family education;Manual  techniques;Passive range of motion;Vasopneumatic Device;Taping;Dry needling;Scar mobilization   Consulted and Agree with Plan of Care Patient      Patient will benefit from skilled therapeutic intervention in order to improve the following deficits and impairments:  Abnormal gait, Decreased activity tolerance, Decreased balance, Decreased range of motion, Decreased mobility, Decreased strength, Difficulty walking, Pain  Visit Diagnosis: Pain in left ankle and joints of left foot  Stiffness of left ankle, not elsewhere classified  Difficulty in walking, not elsewhere classified  Other abnormalities of gait and mobility  Muscle weakness (generalized)     Problem List Patient Active Problem List   Diagnosis Date Noted  . Idiopathic chronic venous hypertension of both lower extremities with inflammation 06/29/2016  . Subtalar joint instability, left 06/29/2016  . Arthritis of right knee 01/30/2016  . Lower extremity edema 10/21/2015  . Leg cramps 08/21/2015  . Left ankle pain 04/09/2015  . Depression with anxiety 01/20/2015  . Aortic stenosis, mild 01/08/2015  . Morbid obesity ([place]) 01/08/2015  . Varicose veins 01/02/2015  . Snoring 12/06/2014  . Viral gastroenteritis 04/06/2013  . Insect bite of ankle, right, infected 07/31/2012  . Primary cancer of right upper lobe of lung ([place]) 10/18/2011  . Hip pain 06/29/2011  . Pain in joint, lower leg 03/17/2009  . GASTROENTERITIS 07/11/2008  . PULMONARY NODULE, SOLITARY 04/18/2008  . LIVER FUNCTION TESTS, ABNORMAL, HX OF 04/01/2008  . B12 DEFICIENCY 11/13/2007  . COUGH 11/13/2007  . UNSPECIFIED VITAMIN D DEFICIENCY 11/07/2007  . TINEA PEDIS 07/07/2007  . LYMPHADENOPATHY 11/16/2006  . Hyperlipidemia 09/15/2006  . Essential hypertension 09/15/2006  . LOW BACK PAIN 09/15/2006     Lanney Gins, PT, DPT 01/27/17 4:49 PM   [facility] [address]  [place], [place], [address] Phone: [phone]   Fax:  [phone]  Name: Elizabeth [name] MRN: [id number] Date of Birth: 18-Aug-1950

## 2017-01-28 ENCOUNTER — Telehealth: Payer: Self-pay

## 2017-01-28 ENCOUNTER — Ambulatory Visit (HOSPITAL_BASED_OUTPATIENT_CLINIC_OR_DEPARTMENT_OTHER): Payer: Medicare Other | Admitting: Adult Health

## 2017-01-28 ENCOUNTER — Encounter: Payer: Self-pay | Admitting: Adult Health

## 2017-01-28 VITALS — BP 103/50 | HR 78 | Temp 98.3°F | Resp 20 | Ht 64.0 in | Wt 278.2 lb

## 2017-01-28 DIAGNOSIS — Z85118 Personal history of other malignant neoplasm of bronchus and lung: Secondary | ICD-10-CM

## 2017-01-28 DIAGNOSIS — C3490 Malignant neoplasm of unspecified part of unspecified bronchus or lung: Secondary | ICD-10-CM

## 2017-01-28 DIAGNOSIS — C3411 Malignant neoplasm of upper lobe, right bronchus or lung: Secondary | ICD-10-CM

## 2017-01-28 NOTE — Telephone Encounter (Signed)
Printed avs and calender for September upcoming appointment Per 9/28 los

## 2017-01-28 NOTE — Progress Notes (Signed)
CLINIC:  Survivorship  REASON FOR VISIT:  Long-term survivorship surveillance visit for patient with history of lung cancer.   BRIEF ONCOLOGIC HISTORY:  Stage IA (T1a, NX, MX) non-small cell lung cancer, squamous cell carcinoma diagnosed in May 2010. Status post right upper lobe superior segmentectomy under the care of Dr. Arlyce Dice on 10/18/2008.    INTERVAL HISTORY:  Elizabeth [name] is doing well today.  She recently had foot surgery and is currently undergoing PT for that.  She did have CT chest in early September that was normal.     ADDITIONAL REVIEW OF SYSTEMS:  Review of Systems  Constitutional: Negative for chills, diaphoresis, fever and malaise/fatigue.  HENT: Negative for hearing loss and tinnitus.   Eyes: Negative for blurred vision and double vision.  Respiratory: Negative for cough and shortness of breath.   Cardiovascular: Negative for chest pain, palpitations and leg swelling.  Gastrointestinal: Negative for constipation, diarrhea, heartburn and nausea.  Musculoskeletal: Negative for neck pain.  Skin: Negative for itching and rash.  Neurological: Negative for dizziness, focal weakness, weakness and headaches.  Endo/Heme/Allergies: Negative for environmental allergies. Does not bruise/bleed easily.  Psychiatric/Behavioral: Negative for depression. The patient is not nervous/anxious.      PAST MEDICAL & SURGICAL HISTORY:  Past Medical History:  Diagnosis Date  . Aortic stenosis, mild 01/08/2015  . Cancer ([place]) lung    lung  . Hx of cancer of lung 6/10   Removed June 2010  . Hyperlipidemia   . Hypertension   . Low back pain   . Morbid obesity ([place]) 01/08/2015  . Vitamin D deficiency    Past Surgical History:  Procedure Laterality Date  . CHOLECYSTECTOMY    . LUNG REMOVAL, PARTIAL    . TUBAL LIGATION         CURRENT MEDICATIONS:  Current Outpatient Prescriptions on File Prior to Visit  Medication Sig Dispense Refill  . Acetaminophen (TYLENOL 8 HOUR PO) Take by  mouth daily. TAKE THREE TABLETS WITH BID EACH MEAL.    Marland Kitchen aspirin 81 MG tablet Take 81 mg by mouth daily.     . Cholecalciferol 4000 UNITS CAPS Take 4,000 capsules by mouth daily.    . clonazePAM (KLONOPIN) 0.5 MG tablet TAKE 1 TABLET BY MOUTH TWICE A DAY AS NEEDED FOR ANXIETY 60 tablet 2  . Homeopathic Products ([employer] LEG CRAMPS PAIN RELIEF) TABS Take by mouth as needed. [employer] Brand    . HYDROcodone-acetaminophen (NORCO/VICODIN) 5-325 MG tablet TK 1 T PO Q 6-8 H.  WEAN AS ABLE  0  . olmesartan-hydrochlorothiazide (BENICAR HCT) 40-12.5 MG tablet Take 1 tablet by mouth daily. 90 tablet 3  . Vitamin D, Ergocalciferol, (DRISDOL) 50000 units CAPS capsule Take 1 capsule (50,000 Units total) by mouth every 7 (seven) days. 12 capsule 5   No current facility-administered medications on file prior to visit.     ALLERGIES:  Allergies  Allergen Reactions  . Penicillins Other (See Comments)    Fever     PHYSICAL EXAM:  Vitals:   01/28/17 1512  BP: (!) 103/50  Pulse: 78  Resp: 20  Temp: 98.3 F (36.8 C)  SpO2: 96%   Filed Weights   01/28/17 1512  Weight: 278 lb 3.2 oz (126.2 kg)    General: Well-nourished, well-appearing female in no acute distress.  Unaccompanied today.  HEENT: Head is atraumatic and normocephalic.  Pupils equal and reactive to light. Conjunctivae clear without exudate.  Sclerae anicteric. Oral mucosa is pink and moist without lesions. Oropharynx is  pink and moist, without lesions. Lymph: No cervical, supraclavicular, or supraclavicular lymphadenopathy noted on palpation.   Cardiovascular: Normal rate and rhythm. Respiratory: Clear to auscultation bilaterally. Chest expansion symmetric without accessory muscle use on inspiration or expiration.   GI: Abdomen soft and round. No tenderness to palpation. Bowel sounds normoactive in 4 quadrants. No hepatosplenomegaly.  GU: Deferred.   Neuro: No focal deficits. Steady gait.   Psych: Normal mood and affect for  situation. Extremities: No edema, cyanosis, or clubbing.   Skin: Warm and dry.    DIAGNOSTIC IMAGING:      ASSESSMENT & PLAN:  Elizabeth [name] is a pleasant 66 y.o. woman with history of stage IA NSCLC, squamous cell carcinoma, diagnosed in May, 2010, treated with right upper lobe segmentectomy alone.  Patient presents to survivorship clinic today for routine surveillance as a long-term cancer survivor.   1. History of lung cancer: Elizabeth [name] is doing well today.  She has no clinical or radiographic sign of lung cancer recurrence.  She will continue with annual CT scan and follow up with survivorship  In one year for surveillance.  2. Health maintenance and cancer prevention: I reviewed with her healthy diet and exercise and gave her handouts about reducing her future cancer risk.  I recommended she continue with routine cancer screenings for skin cancer, colon cancer, and breast cancer.  She no longer meets criteria for cervical cancer screening.    Dispo:  -LTS f/u in one year -CT chest in one year with labs   A total of (20) minutes of face-to-face time was spent with this patient with greater than 50% of that time in counseling and care-coordination.   Elizabeth Massed, NP [place] [phone]

## 2017-01-31 ENCOUNTER — Ambulatory Visit: Payer: Medicare Other | Attending: Orthopedic Surgery

## 2017-01-31 DIAGNOSIS — M25572 Pain in left ankle and joints of left foot: Secondary | ICD-10-CM | POA: Diagnosis not present

## 2017-01-31 DIAGNOSIS — R262 Difficulty in walking, not elsewhere classified: Secondary | ICD-10-CM | POA: Insufficient documentation

## 2017-01-31 DIAGNOSIS — R2689 Other abnormalities of gait and mobility: Secondary | ICD-10-CM | POA: Insufficient documentation

## 2017-01-31 DIAGNOSIS — M6281 Muscle weakness (generalized): Secondary | ICD-10-CM | POA: Diagnosis not present

## 2017-01-31 DIAGNOSIS — M25672 Stiffness of left ankle, not elsewhere classified: Secondary | ICD-10-CM | POA: Insufficient documentation

## 2017-01-31 NOTE — Therapy (Addendum)
[facility] [address]  [place], [place], [address] Phone: [phone]   Fax:  [phone]  Physical Therapy Treatment  Patient Details  Name: Elizabeth [name] MRN: [id number] Date of Birth: 05/08/1950 Referring Provider: Dr. Bernarda Caffey  Encounter Date: 01/31/2017      PT End of Session - 01/31/17 1107    Visit Number 7   Number of Visits 16   Date for PT Re-Evaluation 02/24/17   Authorization Type Medicare   PT Start Time 1102   PT Stop Time 1154   PT Time Calculation (min) 52 min   Activity Tolerance Patient tolerated treatment well   Behavior During Therapy [facility] for tasks assessed/performed      Past Medical History:  Diagnosis Date  . Aortic stenosis, mild 01/08/2015  . Cancer ([place]) lung    lung  . Hx of cancer of lung 6/10   Removed June 2010  . Hyperlipidemia   . Hypertension   . Low back pain   . Morbid obesity ([place]) 01/08/2015  . Vitamin D deficiency     Past Surgical History:  Procedure Laterality Date  . CHOLECYSTECTOMY    . LUNG REMOVAL, PARTIAL    . TUBAL LIGATION      There were no vitals filed for this visit.      Subjective Assessment - 01/31/17 1105    Subjective Pt. noting some soreness in L ankle and lower leg following last visit however this resolved the next day.     Patient Stated Goals improve balance and ability to walk   Currently in Pain? Yes   Pain Score 3    Pain Location Ankle   Pain Orientation Left   Pain Descriptors / Indicators Aching;Sore   Pain Type Surgical pain   Pain Onset More than a month ago   Pain Frequency Intermittent   Aggravating Factors  walking, wt. bearing   Pain Relieving Factors rest    Multiple Pain Sites No                         [facility] Adult PT Treatment/Exercise - 01/31/17 1120      Ambulation/Gait   Ambulation/Gait Yes   Ambulation/Gait Assistance 5: Supervision   Ambulation Distance (Feet) 100 Feet    Assistive device Straight cane   Gait Comments VC for upright posture     Neuro Re-ed    Neuro Re-ed Details  narrow stance, B staggered stance on blue airex pad x 60 sec each way     Vasopneumatic   Number Minutes Vasopneumatic  10 minutes   Vasopnuematic Location  Ankle   Vasopneumatic Pressure Medium   Vasopneumatic Temperature  coldest temp     Ankle Exercises: Aerobic   Stationary Bike NuStep: lvl 5, 7 min      Ankle Exercises: Standing   Heel Raises 15 reps   Heel Raises Limitations B UE support    Toe Raise 15 reps   Other Standing Ankle Exercises tandem walk forward/backwards at counter x 2 laps; [facility]    Other Standing Ankle Exercises step up and over (side/side) on AirEx x 10 reps; with [facility]      Ankle Exercises: Seated   Other Seated Ankle Exercises 4 way ankle resist - red tband x 15 each (PF with green TB)                  PT Short Term  Goals - 01/06/17 1427      PT SHORT TERM GOAL #1   Title patient to be independent with initial HEP   Status On-going           PT Long Term Goals - 01/06/17 1427      PT LONG TERM GOAL #1   Title Patient to be independent with advanced HEP   Status On-going     PT LONG TERM GOAL #2   Title patient to improve L ankle/foot/LE strength to >/= 4+/5 without pain   Status On-going     PT LONG TERM GOAL #3   Title patient to demonstrate good heel to gait mechanics over various surfaces with no evidence of LOB   Status On-going     PT LONG TERM GOAL #4   Title patient to report ability to ambulate/stand for >/=1 hour in order to allow for return to work   Status On-going     PT LONG TERM GOAL #5   Title patient to demonstrate L SL stance for >/= 10 seconds   Status On-going               Plan - 01/31/17 1135    Clinical Impression Statement Pt. doing well today. Noted increased soreness at L ankle following last visit which subsided next day.  Tolerated all standing proprioception and strengthening  activities well today.  Seated rest breaks taken throughout standing therex to reduce pain levels.  Able to advance difficulty with compliant surface balance activities today without issue.     PT Treatment/Interventions ADLs/Self Care Home Management;Cryotherapy;Electrical Stimulation;Iontophoresis 4mg /ml Dexamethasone;Moist Heat;Ultrasound;Neuromuscular re-education;Balance training;Therapeutic exercise;Therapeutic activities;Functional mobility training;Stair training;Gait training;Patient/family education;Manual techniques;Passive range of motion;Vasopneumatic Device;Taping;Dry needling;Scar mobilization      Patient will benefit from skilled therapeutic intervention in order to improve the following deficits and impairments:  Abnormal gait, Decreased activity tolerance, Decreased balance, Decreased range of motion, Decreased mobility, Decreased strength, Difficulty walking, Pain  Visit Diagnosis: Pain in left ankle and joints of left foot  Stiffness of left ankle, not elsewhere classified  Difficulty in walking, not elsewhere classified  Other abnormalities of gait and mobility  Muscle weakness (generalized)     Problem List Patient Active Problem List   Diagnosis Date Noted  . Idiopathic chronic venous hypertension of both lower extremities with inflammation 06/29/2016  . Subtalar joint instability, left 06/29/2016  . Arthritis of right knee 01/30/2016  . Lower extremity edema 10/21/2015  . Leg cramps 08/21/2015  . Left ankle pain 04/09/2015  . Depression with anxiety 01/20/2015  . Aortic stenosis, mild 01/08/2015  . Morbid obesity ([place]) 01/08/2015  . Varicose veins 01/02/2015  . Snoring 12/06/2014  . Viral gastroenteritis 04/06/2013  . Insect bite of ankle, right, infected 07/31/2012  . Primary cancer of right upper lobe of lung ([place]) 10/18/2011  . Hip pain 06/29/2011  . Pain in joint, lower leg 03/17/2009  . GASTROENTERITIS 07/11/2008  . PULMONARY NODULE, SOLITARY  04/18/2008  . LIVER FUNCTION TESTS, ABNORMAL, HX OF 04/01/2008  . B12 DEFICIENCY 11/13/2007  . COUGH 11/13/2007  . UNSPECIFIED VITAMIN D DEFICIENCY 11/07/2007  . TINEA PEDIS 07/07/2007  . LYMPHADENOPATHY 11/16/2006  . Hyperlipidemia 09/15/2006  . Essential hypertension 09/15/2006  . LOW BACK PAIN 09/15/2006    Bess Harvest, PTA 01/31/17 12:13 PM  [facility] [address]  [place], [place], [address] Phone: [phone]   Fax:  [phone]  Name: Elizabeth [name] MRN: [id number] Date  of Birth: [birth year]

## 2017-02-03 ENCOUNTER — Ambulatory Visit: Payer: Medicare Other

## 2017-02-03 DIAGNOSIS — R262 Difficulty in walking, not elsewhere classified: Secondary | ICD-10-CM

## 2017-02-03 DIAGNOSIS — M25672 Stiffness of left ankle, not elsewhere classified: Secondary | ICD-10-CM | POA: Diagnosis not present

## 2017-02-03 DIAGNOSIS — R2689 Other abnormalities of gait and mobility: Secondary | ICD-10-CM | POA: Diagnosis not present

## 2017-02-03 DIAGNOSIS — M25572 Pain in left ankle and joints of left foot: Secondary | ICD-10-CM

## 2017-02-03 DIAGNOSIS — M6281 Muscle weakness (generalized): Secondary | ICD-10-CM | POA: Diagnosis not present

## 2017-02-03 NOTE — Therapy (Signed)
[facility] [address]  [place], [place], [address] Phone: [phone]   Fax:  [phone]  Physical Therapy Treatment  Patient Details  Name: Elizabeth [name] MRN: [id number] Date of Birth: Nov 11, 1950 Referring Provider: Dr. Bernarda Caffey  Encounter Date: 02/03/2017      PT End of Session - 02/03/17 1111    Visit Number 8   Number of Visits 16   Date for PT Re-Evaluation 02/24/17   Authorization Type Medicare   PT Start Time 1106  Pt. arrived late    PT Stop Time 1205   PT Time Calculation (min) 59 min   Activity Tolerance Patient tolerated treatment well   Behavior During Therapy [facility] for tasks assessed/performed      Past Medical History:  Diagnosis Date  . Aortic stenosis, mild 01/08/2015  . Cancer ([place]) lung    lung  . Hx of cancer of lung 6/10   Removed June 2010  . Hyperlipidemia   . Hypertension   . Low back pain   . Morbid obesity ([place]) 01/08/2015  . Vitamin D deficiency     Past Surgical History:  Procedure Laterality Date  . CHOLECYSTECTOMY    . LUNG REMOVAL, PARTIAL    . TUBAL LIGATION      There were no vitals filed for this visit.      Subjective Assessment - 02/03/17 1110    Subjective Pt. noting some lateral L ankle pain following last visit which subsided next visit.  Pt. noting less pain today stating, "it's not really hurting".     Patient Stated Goals improve balance and ability to walk   Currently in Pain? Yes   Pain Score 2    Pain Location Ankle   Pain Orientation Left;Lateral   Pain Descriptors / Indicators Aching;Sore   Pain Type Surgical pain   Pain Onset More than a month ago   Multiple Pain Sites No                         [facility] Adult PT Treatment/Exercise - 02/03/17 1117      Ambulation/Gait   Ambulation/Gait Yes   Ambulation/Gait Assistance 5: Supervision   Ambulation Distance (Feet) 100 Feet   Assistive device Straight cane   Gait  Comments still requiring VC for upright posture; better carryover      Neuro Re-ed    Neuro Re-ed Details  tandem stance and side step at counter 1 lap each way; cane and counter      Vasopneumatic   Number Minutes Vasopneumatic  10 minutes   Vasopnuematic Location  Ankle  L    Vasopneumatic Pressure Medium   Vasopneumatic Temperature  coldest temp     Ankle Exercises: Seated   Heel Raises 10 reps;3 seconds  2 sets    Toe Raise 10 reps;3 seconds  2 sets    Other Seated Ankle Exercises 4 way ankle resist - red tband x 20 each (PF with green TB)     Ankle Exercises: Aerobic   Stationary Bike NuStep: lvl 5, 5 min      Ankle Exercises: Standing   Other Standing Ankle Exercises step up and over side/side, and forward on AirEx x 10 reps; with 2 chair UE support      Ankle Exercises: Stretches   Other Stretch L HS stretch 2 x 30 sec  PT Education - 02/03/17 1203    Education provided Yes   Education Details seated heel raise/Seated toe raise    Person(s) Educated Patient   Methods Explanation;Demonstration;Handout   Comprehension Verbalized understanding;Returned demonstration;Verbal cues required;Need further instruction          PT Short Term Goals - 02/03/17 1126      PT SHORT TERM GOAL #1   Title patient to be independent with initial HEP   Status Achieved           PT Long Term Goals - 01/06/17 1427      PT LONG TERM GOAL #1   Title Patient to be independent with advanced HEP   Status On-going     PT LONG TERM GOAL #2   Title patient to improve L ankle/foot/LE strength to >/= 4+/5 without pain   Status On-going     PT LONG TERM GOAL #3   Title patient to demonstrate good heel to gait mechanics over various surfaces with no evidence of LOB   Status On-going     PT LONG TERM GOAL #4   Title patient to report ability to ambulate/stand for >/=1 hour in order to allow for return to work   Status On-going     PT [place] #5    Title patient to demonstrate L SL stance for >/= 10 seconds   Status On-going               Plan - 02/03/17 [place] doing well today noting some increased soreness following last visit, which subsided next day.  Mild increase in standing therex today with continued sitting rest breaks to manage pain levels.  Mild increase in reps with proprioception activities on compliant surface.  Pt. with most difficulty with tandem walk at counter today.  Seems to be progress well with wt. bearing activities.  Ended with some increased L ankle soreness thus ice/compression applied to reduce post-exercise swelling and pain.   PT Treatment/Interventions ADLs/Self Care Home Management;Cryotherapy;Electrical Stimulation;Iontophoresis 4mg /ml Dexamethasone;Moist Heat;Ultrasound;Neuromuscular re-education;Balance training;Therapeutic exercise;Therapeutic activities;Functional mobility training;Stair training;Gait training;Patient/family education;Manual techniques;Passive range of motion;Vasopneumatic Device;Taping;Dry needling;Scar mobilization      Patient will benefit from skilled therapeutic intervention in order to improve the following deficits and impairments:  Abnormal gait, Decreased activity tolerance, Decreased balance, Decreased range of motion, Decreased mobility, Decreased strength, Difficulty walking, Pain  Visit Diagnosis: Pain in left ankle and joints of left foot  Stiffness of left ankle, not elsewhere classified  Difficulty in walking, not elsewhere classified  Other abnormalities of gait and mobility  Muscle weakness (generalized)     Problem List Patient Active Problem List   Diagnosis Date Noted  . Idiopathic chronic venous hypertension of both lower extremities with inflammation 06/29/2016  . Subtalar joint instability, left 06/29/2016  . Arthritis of right knee 01/30/2016  . Lower extremity edema 10/21/2015  . Leg cramps 08/21/2015  .  Left ankle pain 04/09/2015  . Depression with anxiety 01/20/2015  . Aortic stenosis, mild 01/08/2015  . Morbid obesity ([place]) 01/08/2015  . Varicose veins 01/02/2015  . Snoring 12/06/2014  . Viral gastroenteritis 04/06/2013  . Insect bite of ankle, right, infected 07/31/2012  . Primary cancer of right upper lobe of lung ([place]) 10/18/2011  . Hip pain 06/29/2011  . Pain in joint, lower leg 03/17/2009  . GASTROENTERITIS 07/11/2008  . PULMONARY NODULE, SOLITARY 04/18/2008  . LIVER FUNCTION TESTS, ABNORMAL, HX OF 04/01/2008  . B12 DEFICIENCY 11/13/2007  .  COUGH 11/13/2007  . UNSPECIFIED VITAMIN D DEFICIENCY 11/07/2007  . TINEA PEDIS 07/07/2007  . LYMPHADENOPATHY 11/16/2006  . Hyperlipidemia 09/15/2006  . Essential hypertension 09/15/2006  . LOW BACK PAIN 09/15/2006    Bess Harvest, PTA 02/03/17 12:45 PM  [facility] [address]  [place], [place], [address] Phone: [phone]   Fax:  [phone]  Name: Elizabeth [name] MRN: [id number] Date of Birth: Mar 08, 1951

## 2017-02-07 ENCOUNTER — Ambulatory Visit: Payer: Medicare Other | Admitting: Physical Therapy

## 2017-02-07 DIAGNOSIS — R2689 Other abnormalities of gait and mobility: Secondary | ICD-10-CM | POA: Diagnosis not present

## 2017-02-07 DIAGNOSIS — M6281 Muscle weakness (generalized): Secondary | ICD-10-CM | POA: Diagnosis not present

## 2017-02-07 DIAGNOSIS — M25672 Stiffness of left ankle, not elsewhere classified: Secondary | ICD-10-CM

## 2017-02-07 DIAGNOSIS — R262 Difficulty in walking, not elsewhere classified: Secondary | ICD-10-CM | POA: Diagnosis not present

## 2017-02-07 DIAGNOSIS — M25572 Pain in left ankle and joints of left foot: Secondary | ICD-10-CM

## 2017-02-07 NOTE — Therapy (Signed)
[facility] [address]  [place], [place], [address] Phone: [phone]   Fax:  [phone]  Physical Therapy Treatment  Patient Details  Name: Elizabeth [name] MRN: [id number] Date of Birth: 11-07-1950 Referring Provider: Dr. Bernarda Caffey  Encounter Date: 02/07/2017      PT End of Session - 02/07/17 1110    Visit Number 9   Number of Visits 16   Date for PT Re-Evaluation 02/24/17   Authorization Type Medicare   PT Start Time 1104   PT Stop Time 1158   PT Time Calculation (min) 54 min   Activity Tolerance Patient tolerated treatment well   Behavior During Therapy [facility] for tasks assessed/performed      Past Medical History:  Diagnosis Date  . Aortic stenosis, mild 01/08/2015  . Cancer ([place]) lung    lung  . Hx of cancer of lung 6/10   Removed June 2010  . Hyperlipidemia   . Hypertension   . Low back pain   . Morbid obesity ([place]) 01/08/2015  . Vitamin D deficiency     Past Surgical History:  Procedure Laterality Date  . CHOLECYSTECTOMY    . LUNG REMOVAL, PARTIAL    . TUBAL LIGATION      There were no vitals filed for this visit.      Subjective Assessment - 02/07/17 1108    Subjective doing well - ran over pinky toe with w/c yesterday; still using w/c to "carry" items within the home   Diagnostic tests Xray - normal healing   Patient Stated Goals improve balance and ability to walk   Currently in Pain? Yes   Pain Score 2   4/10 with walking   Pain Location Ankle   Pain Orientation Left   Pain Descriptors / Indicators Aching;Sore   Pain Type Surgical pain                         [facility] Adult PT Treatment/Exercise - 02/07/17 0001      Modalities   Modalities Vasopneumatic     Vasopneumatic   Number Minutes Vasopneumatic  15 minutes   Vasopnuematic Location  Ankle   Vasopneumatic Pressure Medium   Vasopneumatic Temperature  coldest temp     Ankle Exercises: Aerobic    Stationary Bike NuStep: lvl 5, 6 min      Ankle Exercises: Standing   Balance Beam side stepping x 5 laps; tandem fwd/bwd x 5 laps - both with UE support from PT   Other Standing Ankle Exercises step up and over with L foot on AirEx (fwd/bwd) x 15 reps - B UE support from PT                  PT Short Term Goals - 02/03/17 1126      PT SHORT TERM GOAL #1   Title patient to be independent with initial HEP   Status Achieved           PT Long Term Goals - 01/06/17 1427      PT LONG TERM GOAL #1   Title Patient to be independent with advanced HEP   Status On-going     PT LONG TERM GOAL #2   Title patient to improve L ankle/foot/LE strength to >/= 4+/5 without pain   Status On-going     PT LONG TERM GOAL #3   Title patient to demonstrate good heel to gait mechanics  over various surfaces with no evidence of LOB   Status On-going     PT LONG TERM GOAL #4   Title patient to report ability to ambulate/stand for >/=1 hour in order to allow for return to work   Status On-going     PT [place] #5   Title patient to demonstrate L SL stance for >/= 10 seconds   Status On-going               Plan - 02/07/17 [place] doing well today with all work on compliant surfaces - does require B UE support from PT, however, able to reduce amount of support needed as patient gains comfort/confidence. Manual therapy to gastroc/soleus reveals continued tightness. Patient reporting continued use of w/c for all household task with PT discouraging this and to begin working towards more tolerance in standing positions to allow for return of sunctional activities within the home and community.    PT Treatment/Interventions ADLs/Self Care Home Management;Cryotherapy;Electrical Stimulation;Iontophoresis 4mg /ml Dexamethasone;Moist Heat;Ultrasound;Neuromuscular re-education;Balance training;Therapeutic exercise;Therapeutic activities;Functional  mobility training;Stair training;Gait training;Patient/family education;Manual techniques;Passive range of motion;Vasopneumatic Device;Taping;Dry needling;Scar mobilization   Consulted and Agree with Plan of Care Patient      Patient will benefit from skilled therapeutic intervention in order to improve the following deficits and impairments:  Abnormal gait, Decreased activity tolerance, Decreased balance, Decreased range of motion, Decreased mobility, Decreased strength, Difficulty walking, Pain  Visit Diagnosis: Pain in left ankle and joints of left foot  Stiffness of left ankle, not elsewhere classified  Difficulty in walking, not elsewhere classified  Other abnormalities of gait and mobility  Muscle weakness (generalized)     Problem List Patient Active Problem List   Diagnosis Date Noted  . Idiopathic chronic venous hypertension of both lower extremities with inflammation 06/29/2016  . Subtalar joint instability, left 06/29/2016  . Arthritis of right knee 01/30/2016  . Lower extremity edema 10/21/2015  . Leg cramps 08/21/2015  . Left ankle pain 04/09/2015  . Depression with anxiety 01/20/2015  . Aortic stenosis, mild 01/08/2015  . Morbid obesity ([place]) 01/08/2015  . Varicose veins 01/02/2015  . Snoring 12/06/2014  . Viral gastroenteritis 04/06/2013  . Insect bite of ankle, right, infected 07/31/2012  . Primary cancer of right upper lobe of lung ([place]) 10/18/2011  . Hip pain 06/29/2011  . Pain in joint, lower leg 03/17/2009  . GASTROENTERITIS 07/11/2008  . PULMONARY NODULE, SOLITARY 04/18/2008  . LIVER FUNCTION TESTS, ABNORMAL, HX OF 04/01/2008  . B12 DEFICIENCY 11/13/2007  . COUGH 11/13/2007  . UNSPECIFIED VITAMIN D DEFICIENCY 11/07/2007  . TINEA PEDIS 07/07/2007  . LYMPHADENOPATHY 11/16/2006  . Hyperlipidemia 09/15/2006  . Essential hypertension 09/15/2006  . LOW BACK PAIN 09/15/2006      Lanney Gins, PT, DPT 02/07/17 3:55 PM   [facility] [address]  [place], [place], [address] Phone: [phone]   Fax:  [phone]  Name: Elizabeth [name] MRN: [id number] Date of Birth: Sep 24, 1950

## 2017-02-10 ENCOUNTER — Ambulatory Visit: Payer: Medicare Other

## 2017-02-10 DIAGNOSIS — R2689 Other abnormalities of gait and mobility: Secondary | ICD-10-CM | POA: Diagnosis not present

## 2017-02-10 DIAGNOSIS — R262 Difficulty in walking, not elsewhere classified: Secondary | ICD-10-CM

## 2017-02-10 DIAGNOSIS — M6281 Muscle weakness (generalized): Secondary | ICD-10-CM | POA: Diagnosis not present

## 2017-02-10 DIAGNOSIS — M25672 Stiffness of left ankle, not elsewhere classified: Secondary | ICD-10-CM | POA: Diagnosis not present

## 2017-02-10 DIAGNOSIS — M25572 Pain in left ankle and joints of left foot: Secondary | ICD-10-CM

## 2017-02-10 NOTE — Therapy (Signed)
[facility] [address]  [place], [place], [address] Phone: [phone]   Fax:  [phone]  Physical Therapy Treatment  Patient Details  Name: Elizabeth [name] MRN: [id number] Date of Birth: December 27, 1950 Referring Provider: Dr. Bernarda Caffey  Encounter Date: 02/10/2017      PT End of Session - 02/10/17 1113    Visit Number 10   Number of Visits 16   Date for PT Re-Evaluation 02/24/17   Authorization Type Medicare   PT Start Time 1103   PT Stop Time 1210   PT Time Calculation (min) 67 min   Activity Tolerance Patient tolerated treatment well   Behavior During Therapy [facility] for tasks assessed/performed      Past Medical History:  Diagnosis Date  . Aortic stenosis, mild 01/08/2015  . Cancer ([place]) lung    lung  . Hx of cancer of lung 6/10   Removed June 2010  . Hyperlipidemia   . Hypertension   . Low back pain   . Morbid obesity ([place]) 01/08/2015  . Vitamin D deficiency     Past Surgical History:  Procedure Laterality Date  . CHOLECYSTECTOMY    . LUNG REMOVAL, PARTIAL    . TUBAL LIGATION      There were no vitals filed for this visit.      Subjective Assessment - 02/10/17 1111    Subjective [name] doing well today no new complaints.  Notes she is getting to HEP most days.   Patient Stated Goals improve balance and ability to walk   Currently in Pain? Yes   Pain Score 1    Pain Location Ankle   Pain Orientation Left;Lateral   Pain Descriptors / Indicators Aching;Sore   Pain Type Surgical pain   Pain Onset More than a month ago   Pain Frequency Intermittent   Aggravating Factors  walking, wt. bearing    Pain Relieving Factors rest    Multiple Pain Sites No            [facility] PT Assessment - 02/10/17 1120      Observation/Other Assessments   Skin Integrity 31   Focus on Therapeutic Outcomes (FOTO)  31% (69% limitation)      AROM   AROM Assessment Site Ankle   Right/Left Ankle Left   Left Ankle Dorsiflexion 9   Left Ankle Plantar Flexion 34   Left Ankle Inversion 10   Left Ankle Eversion 10     Strength   Strength Assessment Site Hip;Knee;Ankle   Right/Left Hip Right;Left   Right Hip Flexion 4+/5   Right Hip Extension 4/5   Right Hip External Rotation  4/5   Right Hip Internal Rotation 4/5   Right Hip ABduction 4/5   Right Hip ADduction 4+/5   Left Hip Flexion 4+/5   Left Hip Extension 4/5   Left Hip External Rotation 4/5   Left Hip Internal Rotation 4/5   Left Hip ABduction 4/5   Left Hip ADduction 4+/5   Right/Left Knee Right;Left   Right Knee Flexion 4+/5   Right Knee Extension 4+/5   Left Knee Flexion 4+/5   Left Knee Extension 4+/5   Right/Left Ankle Right;Left   Right Ankle Dorsiflexion 4/5   Left Ankle Dorsiflexion 4/5   Left Ankle Plantar Flexion 4/5   Left Ankle Inversion 4/5                     [facility] Adult PT Treatment/Exercise -  02/10/17 1120      Ambulation/Gait   Ambulation/Gait Yes   Ambulation/Gait Assistance 5: Supervision   Ambulation Distance (Feet) 100 Feet   Assistive device Straight cane   Gait Comments still requiring VC for upright posture; able to self-correct      Self-Care   Self-Care Other Self-Care Comments   Other Self-Care Comments  Discussion with therapist and PT regarding current ankle status and addressing pt. concerns regarding timing for returning to work and typical expectations      Exercises   Exercises Knee/Hip     Knee/Hip Exercises: Standing   Lateral Step Up Right;Left;5 reps;Step Height: 4"   Lateral Step Up Limitations with Heavy UE support on TM rail    Forward Step Up Left;Step Height: 4";10 reps   Forward Step Up Limitations Heavy UE support on TM rail and SPC    Other Standing Knee Exercises Alternating toe-touch to 6" step x 20 reps each side; to encouraged L SLS acceptance     Vasopneumatic   Number Minutes Vasopneumatic  10 minutes   Vasopnuematic Location  Ankle    Vasopneumatic Pressure Medium   Vasopneumatic Temperature  coldest temp     Ankle Exercises: Aerobic   Stationary Bike NuStep: lvl 5, 6 min      Ankle Exercises: Standing   Other Standing Ankle Exercises step up and over with L foot on AirEx (side<>side) x 15 reps - B UE support from therapist      Ankle Exercises: Seated   Heel Raises 3 seconds;15 reps   Toe Raise 3 seconds;15 reps                  PT Short Term Goals - 02/03/17 1126      PT SHORT TERM GOAL #1   Title patient to be independent with initial HEP   Status Achieved           PT Long Term Goals - 02/10/17 1119      PT LONG TERM GOAL #1   Title Patient to be independent with advanced HEP   Status On-going     PT LONG TERM GOAL #2   Title patient to improve L ankle/foot/LE strength to >/= 4+/5 without pain   Status Partially Met     PT LONG TERM GOAL #3   Title patient to demonstrate good heel to gait mechanics over various surfaces with no evidence of LOB   Status On-going     PT LONG TERM GOAL #4   Title patient to report ability to ambulate/stand for >/=1 hour in order to allow for return to work   Status On-going  Notes ~ 3 min standing without needing sitting rest break due to pain      PT LONG TERM GOAL #5   Title patient to demonstrate L SL stance for >/= 10 seconds   Status On-going               Plan - 02/10/17 1118    Clinical Impression Statement [name] doing well today reporting increased tolerance for standing activity however still admits to using WC to transport items around house.  Able to demo improved AROM and strength today at L LE.  Initiated 4" step-up and lateral step-ups today however allowing pt. heavy UE support for this.  Continued proprioception training with stepping on compliant surface today with pt. needing less UE support with this.  Still reporting <33mn tolerance for standing.  Progressing well with therapy at this  point.  Will continue to benefit from  further skilled therapy to improve tolerance for standing activities and safety with gait.     PT Treatment/Interventions ADLs/Self Care Home Management;Cryotherapy;Electrical Stimulation;Iontophoresis 33m/ml Dexamethasone;Moist Heat;Ultrasound;Neuromuscular re-education;Balance training;Therapeutic exercise;Therapeutic activities;Functional mobility training;Stair training;Gait training;Patient/family education;Manual techniques;Passive range of motion;Vasopneumatic Device;Taping;Dry needling;Scar mobilization      Patient will benefit from skilled therapeutic intervention in order to improve the following deficits and impairments:  Abnormal gait, Decreased activity tolerance, Decreased balance, Decreased range of motion, Decreased mobility, Decreased strength, Difficulty walking, Pain  Visit Diagnosis: Pain in left ankle and joints of left foot  Stiffness of left ankle, not elsewhere classified  Difficulty in walking, not elsewhere classified  Other abnormalities of gait and mobility  Muscle weakness (generalized)       G-Codes - 111/07/181220    Functional Assessment Tool Used (Outpatient Only) FOTO: 31% (69% limitation)   Functional Limitation Changing and maintaining body position   Changing and Maintaining Body Position Current Status ((J5051 At least 60 percent but less than 80 percent impaired, limited or restricted   Changing and Maintaining Body Position Goal Status ((G3358 At least 40 percent but less than 60 percent impaired, limited or restricted      Problem List Patient Active Problem List   Diagnosis Date Noted  . Idiopathic chronic venous hypertension of both lower extremities with inflammation 06/29/2016  . Subtalar joint instability, left 06/29/2016  . Arthritis of right knee 01/30/2016  . Lower extremity edema 10/21/2015  . Leg cramps 08/21/2015  . Left ankle pain 04/09/2015  . Depression with anxiety 01/20/2015  . Aortic stenosis, mild 01/08/2015  . Morbid  obesity ([place] 01/08/2015  . Varicose veins 01/02/2015  . Snoring 12/06/2014  . Viral gastroenteritis 04/06/2013  . Insect bite of ankle, right, infected 07/31/2012  . [place] of [place] of [place] 10/18/2011  . Hip pain 06/29/2011  . Pain in joint, lower leg 03/17/2009  . GASTROENTERITIS 07/11/2008  . PULMONARY NODULE, SOLITARY 04/18/2008  . LIVER FUNCTION TESTS, ABNORMAL, HX OF 04/01/2008  . B12 DEFICIENCY 11/13/2007  . COUGH 11/13/2007  . UNSPECIFIED VITAMIN D DEFICIENCY 11/07/2007  . TINEA PEDIS 07/07/2007  . LYMPHADENOPATHY 11/16/2006  . Hyperlipidemia 09/15/2006  . Essential hypertension 09/15/2006  . LOW BACK PAIN 09/15/2006    MBess Harvest PTA 111/07/20182:06 PM   SLanney Gins PT, DPT 107-Nov-20182:06 PM   [facility] [place] [address]Phone: [phone]  Fax:  [phone] Name: Elizabeth BEAUCHAINEMRN: [id number] of Birth: [birth year]

## 2017-02-14 ENCOUNTER — Ambulatory Visit (INDEPENDENT_AMBULATORY_CARE_PROVIDER_SITE_OTHER): Payer: Medicare Other | Admitting: Family Medicine

## 2017-02-14 ENCOUNTER — Encounter: Payer: Self-pay | Admitting: Family Medicine

## 2017-02-14 ENCOUNTER — Ambulatory Visit: Payer: Medicare Other | Admitting: Physical Therapy

## 2017-02-14 VITALS — BP 118/68 | HR 70 | Temp 98.0°F | Ht 64.0 in | Wt 277.0 lb

## 2017-02-14 DIAGNOSIS — M25572 Pain in left ankle and joints of left foot: Secondary | ICD-10-CM | POA: Diagnosis not present

## 2017-02-14 DIAGNOSIS — F419 Anxiety disorder, unspecified: Secondary | ICD-10-CM

## 2017-02-14 DIAGNOSIS — R262 Difficulty in walking, not elsewhere classified: Secondary | ICD-10-CM

## 2017-02-14 DIAGNOSIS — R2689 Other abnormalities of gait and mobility: Secondary | ICD-10-CM | POA: Diagnosis not present

## 2017-02-14 DIAGNOSIS — E7800 Pure hypercholesterolemia, unspecified: Secondary | ICD-10-CM | POA: Diagnosis not present

## 2017-02-14 DIAGNOSIS — Z23 Encounter for immunization: Secondary | ICD-10-CM

## 2017-02-14 DIAGNOSIS — M25672 Stiffness of left ankle, not elsewhere classified: Secondary | ICD-10-CM

## 2017-02-14 DIAGNOSIS — E559 Vitamin D deficiency, unspecified: Secondary | ICD-10-CM

## 2017-02-14 DIAGNOSIS — I10 Essential (primary) hypertension: Secondary | ICD-10-CM | POA: Diagnosis not present

## 2017-02-14 DIAGNOSIS — M6281 Muscle weakness (generalized): Secondary | ICD-10-CM | POA: Diagnosis not present

## 2017-02-14 MED ORDER — CLONAZEPAM 0.5 MG PO TABS: ORAL_TABLET | ORAL | 0 refills | Status: DC

## 2017-02-14 NOTE — Assessment & Plan Note (Signed)
Pt only taking klonopin qd Ok to break in half x 1-2 weeks then stop rto prn

## 2017-02-14 NOTE — Assessment & Plan Note (Signed)
Tolerating statin, encouraged heart healthy diet, avoid trans fats, minimize simple carbs and saturated fats. Increase exercise as tolerated 

## 2017-02-14 NOTE — Assessment & Plan Note (Signed)
Well controlled, no changes to meds. Encouraged heart healthy diet such as the DASH diet and exercise as tolerated.  °

## 2017-02-14 NOTE — Progress Notes (Signed)
Patient ID: Elizabeth [name], female    DOB: 05/19/1950  Age: 66 y.o. MRN: [id number]    Subjective:  Subjective  HPI IMAGINE NEST presents for she would like to come off the klonopin and she still needs to lose weight before she can have her knee surgery.    Review of Systems  Constitutional: Negative for appetite change, diaphoresis, fatigue and unexpected weight change.  Eyes: Negative for pain, redness and visual disturbance.  Respiratory: Negative for cough, chest tightness, shortness of breath and wheezing.   Cardiovascular: Negative for chest pain, palpitations and leg swelling.  Endocrine: Negative for cold intolerance, heat intolerance, polydipsia, polyphagia and polyuria.  Genitourinary: Negative for difficulty urinating, dysuria and frequency.  Neurological: Negative for dizziness, light-headedness, numbness and headaches.    History Past Medical History:  Diagnosis Date  . Aortic stenosis, mild 01/08/2015  . Cancer ([place]) lung    lung  . Hx of cancer of lung 6/10   Removed June 2010  . Hyperlipidemia   . Hypertension   . Low back pain   . Morbid obesity ([place]) 01/08/2015  . Vitamin D deficiency     She has a past surgical history that includes Cholecystectomy; Tubal ligation; Lung removal, partial; and Ankle Fusion (Left).   Her family history includes Cancer in her brother and mother; Heart disease in her father; Hypertension in her mother; Stroke in her mother.She reports that she quit smoking about 12 years ago. Her smoking use included Cigarettes. She has never used smokeless tobacco. She reports that she does not drink alcohol or use drugs.  Current Outpatient Prescriptions on File Prior to Visit  Medication Sig Dispense Refill  . Acetaminophen (TYLENOL 8 HOUR PO) Take by mouth daily. TAKE THREE TABLETS WITH BID EACH MEAL.    Marland Kitchen aspirin 81 MG tablet Take 81 mg by mouth 2 (two) times daily.     . Cholecalciferol 4000 UNITS CAPS Take 4,000 capsules by mouth daily.    .  Homeopathic Products ([employer] LEG CRAMPS PAIN RELIEF) TABS Take by mouth as needed. [employer]ylands Brand    . olmesartan-hydrochlorothiazide (BENICAR HCT) 40-12.5 MG tablet Take 1 tablet by mouth daily. 90 tablet 3  . Vitamin D, Ergocalciferol, (DRISDOL) 50000 units CAPS capsule Take 1 capsule (50,000 Units total) by mouth every 7 (seven) days. 12 capsule 5   No current facility-administered medications on file prior to visit.      Objective:  Objective  Physical Exam  Constitutional: She is oriented to person, place, and time. She appears well-developed and well-nourished.  HENT:  Head: Normocephalic and atraumatic.  Eyes: Conjunctivae and EOM are normal.  Neck: Normal range of motion. Neck supple. No JVD present. Carotid bruit is not present. No thyromegaly present.  Cardiovascular: Normal rate and regular rhythm.   Murmur heard. Pulmonary/Chest: Effort normal and breath sounds normal. No respiratory distress. She has no wheezes. She has no rales. She exhibits no tenderness.  Musculoskeletal: She exhibits no edema.  Neurological: She is alert and oriented to person, place, and time.  Psychiatric: She has a normal mood and affect.  Nursing note and vitals reviewed.  BP 118/68   Pulse 70   Temp 98 F (36.7 C) (Oral)   Ht 5\' 4"  (1.626 m)   Wt 277 lb (125.6 kg)   SpO2 97%   BMI 47.55 kg/m  Wt Readings from Last 3 Encounters:  02/14/17 277 lb (125.6 kg)  01/28/17 278 lb 3.2 oz (126.2 kg)  12/14/16 275 lb (  124.7 kg)     Lab Results  Component Value Date   WBC 8.3 01/05/2017   HGB 13.0 01/05/2017   HCT 40.3 01/05/2017   PLT 202 01/05/2017   GLUCOSE 99 01/05/2017   CHOL 164 01/05/2017   TRIG 106 01/05/2017   HDL 44 01/05/2017   LDLCALC 99 01/05/2017   ALT 13 01/05/2017   AST 20 01/05/2017   NA 143 01/05/2017   K 4.4 01/05/2017   CL 103 12/08/2015   CREATININE 0.9 01/05/2017   BUN 10.2 01/05/2017   CO2 31 (H) 01/05/2017   TSH 2.990 01/05/2017   INR 1.0 10/16/2008   HGBA1C  5.5 01/05/2017    Ct Chest Wo Contrast  Result Date: 01/05/2017 CLINICAL DATA:  Right lung cancer EXAM: CT CHEST WITHOUT CONTRAST TECHNIQUE: Multidetector CT imaging of the chest was performed following the standard protocol without IV contrast. COMPARISON:  10/16/2015 FINDINGS: Cardiovascular: The heart size is normal. No pericardial effusion. Coronary artery calcification is evident. Atherosclerotic calcification is noted in the wall of the thoracic aorta. Mediastinum/Nodes: Small lymph nodes in the left thoracic inlet are similar to prior. Scattered small mediastinal lymph nodes are unchanged. Prominent subcarinal lymph node is similar to prior. No evidence for gross hilar lymphadenopathy although assessment is limited by the lack of intravenous contrast on today's study. The esophagus has normal imaging features. There is no axillary lymphadenopathy. Lungs/Pleura: Architectural distortion and scarring at the right lung base is similar to prior. The focal thickening of extrapleural fat noted in the posterior right upper hemithorax is similar to prior. 3 mm peripheral left upper lobe nodule seen image 35 series 5 is unchanged. 2 mm peripheral left upper lobe posterior pulmonary nodule on image 40 is also stable. Upper Abdomen: The liver shows diffusely decreased attenuation suggesting steatosis. Gallbladder surgically absent. Musculoskeletal: Bone windows reveal no worrisome lytic or sclerotic osseous lesions. IMPRESSION: 1. Stable exam. No new or progressive interval findings. Specifically, no features to suggest recurrent or metastatic disease in the chest. 2.  Aortic Atherosclerois (ICD10-170.0) 3. Hepatic steatosis. Electronically Signed   By: Misty Stanley M.D.   On: 01/05/2017 15:02     Assessment & Plan:  Plan  I have discontinued Ms. Arico's HYDROcodone-acetaminophen. I have also changed her clonazePAM. Additionally, I am having her maintain her [employer]S LEG CRAMPS PAIN RELIEF, Cholecalciferol, aspirin,  Acetaminophen (TYLENOL 8 HOUR PO), Vitamin D (Ergocalciferol), and olmesartan-hydrochlorothiazide.  Meds ordered this encounter  Medications  . clonazePAM (KLONOPIN) 0.5 MG tablet    Sig: 1/2 po qd for 2-3 weeks then stop    Dispense:  30 tablet    Refill:  0    Not to exceed 4 additional fills before 08/11/2016    Problem List Items Addressed This Visit      Unprioritized   Anxiety - Primary    Pt only taking klonopin qd Ok to break in half x 1-2 weeks then stop rto prn       Relevant Medications   clonazePAM (KLONOPIN) 0.5 MG tablet   Essential hypertension    Well controlled, no changes to meds. Encouraged heart healthy diet such as the DASH diet and exercise as tolerated.        Hyperlipidemia    Tolerating statin, encouraged heart healthy diet, avoid trans fats, minimize simple carbs and saturated fats. Increase exercise as tolerated       Other Visit Diagnoses    Need for immunization against influenza       Relevant Orders  Flu vaccine HIGH DOSE PF (Fluzone High dose) (Completed)   Vitamin D deficiency       Relevant Orders   Vitamin D (25 hydroxy)      Follow-up: Return in about 6 months (around 08/15/2017), or if symptoms worsen or fail to improve, for hypertension.  Ann Held, DO

## 2017-02-14 NOTE — Therapy (Signed)
[facility] [address]  [place], [place], [address] Phone: [phone]   Fax:  [phone]  Physical Therapy Treatment  Patient Details  Name: Elizabeth [name] MRN: [id number] Date of Birth: August 06, 1950 Referring Provider: Dr. Bernarda Caffey  Encounter Date: 02/14/2017      PT End of Session - 02/14/17 1533    Visit Number 11   Number of Visits 16   Date for PT Re-Evaluation 02/24/17   Authorization Type Medicare   PT Start Time 2122   PT Stop Time 1628   PT Time Calculation (min) 58 min   Activity Tolerance Patient tolerated treatment well   Behavior During Therapy [facility] for tasks assessed/performed      Past Medical History:  Diagnosis Date  . Aortic stenosis, mild 01/08/2015  . Cancer ([place]) lung    lung  . Hx of cancer of lung 6/10   Removed June 2010  . Hyperlipidemia   . Hypertension   . Low back pain   . Morbid obesity ([place]) 01/08/2015  . Vitamin D deficiency     Past Surgical History:  Procedure Laterality Date  . ANKLE FUSION Left   . CHOLECYSTECTOMY    . LUNG REMOVAL, PARTIAL    . TUBAL LIGATION      There were no vitals filed for this visit.      Subjective Assessment - 02/14/17 1532    Subjective patient doing well today   Diagnostic tests Xray - normal healing   Patient Stated Goals improve balance and ability to walk   Currently in Pain? Yes   Pain Score 1    Pain Location Ankle   Pain Orientation Left   Pain Descriptors / Indicators Sore   Pain Type Surgical pain                         [facility] Adult PT Treatment/Exercise - 02/14/17 1536      Exercises   Exercises Ankle     Vasopneumatic   Number Minutes Vasopneumatic  15 minutes   Vasopnuematic Location  Ankle   Vasopneumatic Pressure High   Vasopneumatic Temperature  coldest temp     Ankle Exercises: Aerobic   Stationary Bike NuStep: lvl 5, 6 min      Ankle Exercises: Standing   Heel Raises 15  reps   Heel Raises Limitations B UE support    Toe Raise 15 reps   Balance Beam side stepping with yellow tband at forefoot x 5 laps;    Other Standing Ankle Exercises L LE step down from 1 AirEx 2 x 15     Ankle Exercises: Seated   Other Seated Ankle Exercises inversion, eversion, PF x 15 - red tband                  PT Short Term Goals - 02/03/17 1126      PT SHORT TERM GOAL #1   Title patient to be independent with initial HEP   Status Achieved           PT Long Term Goals - 02/10/17 1119      PT LONG TERM GOAL #1   Title Patient to be independent with advanced HEP   Status On-going     PT LONG TERM GOAL #2   Title patient to improve L ankle/foot/LE strength to >/= 4+/5 without pain   Status Partially Met     PT  LONG TERM GOAL #3   Title patient to demonstrate good heel to gait mechanics over various surfaces with no evidence of LOB   Status On-going     PT LONG TERM GOAL #4   Title patient to report ability to ambulate/stand for >/=1 hour in order to allow for return to work   Status On-going  Notes ~ 3 min standing without needing sitting rest break due to pain      PT LONG TERM GOAL #5   Title patient to demonstrate L SL stance for >/= 10 seconds   Status On-going               Plan - 02/14/17 1632    Clinical Impression Statement Patient doing well today with all strengthening tasks - L ankle/foot fatigue as well as pain at R knee most limiting thus far. Patient continuing to ambulate with limp as well as required use of SPC for support due to reduced biomechanics at L ankle vs R knee pain. Will continue to proress towards goals.    PT Treatment/Interventions ADLs/Self Care Home Management;Cryotherapy;Electrical Stimulation;Iontophoresis 91m/ml Dexamethasone;Moist Heat;Ultrasound;Neuromuscular re-education;Balance training;Therapeutic exercise;Therapeutic activities;Functional mobility training;Stair training;Gait training;Patient/family  education;Manual techniques;Passive range of motion;Vasopneumatic Device;Taping;Dry needling;Scar mobilization   Consulted and Agree with Plan of Care Patient      Patient will benefit from skilled therapeutic intervention in order to improve the following deficits and impairments:  Abnormal gait, Decreased activity tolerance, Decreased balance, Decreased range of motion, Decreased mobility, Decreased strength, Difficulty walking, Pain  Visit Diagnosis: Pain in left ankle and joints of left foot  Stiffness of left ankle, not elsewhere classified  Difficulty in walking, not elsewhere classified  Other abnormalities of gait and mobility  Muscle weakness (generalized)     Problem List Patient Active Problem List   Diagnosis Date Noted  . Idiopathic chronic venous hypertension of both lower extremities with inflammation 06/29/2016  . Subtalar joint instability, left 06/29/2016  . Arthritis of right knee 01/30/2016  . Lower extremity edema 10/21/2015  . Leg cramps 08/21/2015  . Left ankle pain 04/09/2015  . Depression with anxiety 01/20/2015  . Aortic stenosis, mild 01/08/2015  . Morbid obesity ([place] 01/08/2015  . Varicose veins 01/02/2015  . Snoring 12/06/2014  . Viral gastroenteritis 04/06/2013  . Insect bite of ankle, right, infected 07/31/2012  . [place] cancer of [place] of [place] 10/18/2011  . Hip pain 06/29/2011  . Pain in joint, lower leg 03/17/2009  . GASTROENTERITIS 07/11/2008  . PULMONARY NODULE, SOLITARY 04/18/2008  . LIVER FUNCTION TESTS, ABNORMAL, HX OF 04/01/2008  . B12 DEFICIENCY 11/13/2007  . COUGH 11/13/2007  . UNSPECIFIED VITAMIN D DEFICIENCY 11/07/2007  . TINEA PEDIS 07/07/2007  . LYMPHADENOPATHY 11/16/2006  . Hyperlipidemia 09/15/2006  . Essential hypertension 09/15/2006  . LOW BACK PAIN 09/15/2006      SLanney Gins PT, DPT 02/14/17 4:34 PM   [facility] [place] [address]Phone: [phone]  Fax:  [phone] Name: Elizabeth RUEGERMRN: [id number] of Birth: [birth year]

## 2017-02-14 NOTE — Patient Instructions (Signed)

## 2017-02-15 LAB — VITAMIN D 25 HYDROXY (VIT D DEFICIENCY, FRACTURES): VITD: 78.87 ng/mL (ref 30.00–100.00)

## 2017-02-16 ENCOUNTER — Ambulatory Visit: Payer: Medicare Other

## 2017-02-16 DIAGNOSIS — R262 Difficulty in walking, not elsewhere classified: Secondary | ICD-10-CM

## 2017-02-16 DIAGNOSIS — M6281 Muscle weakness (generalized): Secondary | ICD-10-CM | POA: Diagnosis not present

## 2017-02-16 DIAGNOSIS — R2689 Other abnormalities of gait and mobility: Secondary | ICD-10-CM | POA: Diagnosis not present

## 2017-02-16 DIAGNOSIS — M25572 Pain in left ankle and joints of left foot: Secondary | ICD-10-CM | POA: Diagnosis not present

## 2017-02-16 DIAGNOSIS — M25672 Stiffness of left ankle, not elsewhere classified: Secondary | ICD-10-CM | POA: Diagnosis not present

## 2017-02-16 NOTE — Therapy (Signed)
[facility] [address]  [place], [place], [address] Phone: [phone]   Fax:  [phone]  Physical Therapy Treatment  Patient Details  Name: Elizabeth [name] MRN: [id number] Date of Birth: [birth year] Referring Provider: Dr. Bernarda Caffey  Encounter Date: 02/16/2017      PT End of Session - 02/16/17 1106    Visit Number 12   Number of Visits 16   Date for PT Re-Evaluation 02/24/17   Authorization Type Medicare   PT Start Time 1101   PT Stop Time 1157   PT Time Calculation (min) 56 min   Activity Tolerance Patient tolerated treatment well   Behavior During Therapy [facility] for tasks assessed/performed      Past Medical History:  Diagnosis Date  . Aortic stenosis, mild 01/08/2015  . Cancer ([place]) lung    lung  . Hx of cancer of lung 6/10   Removed June 2010  . Hyperlipidemia   . Hypertension   . Low back pain   . Morbid obesity ([place]) 01/08/2015  . Vitamin D deficiency     Past Surgical History:  Procedure Laterality Date  . ANKLE FUSION Left   . CHOLECYSTECTOMY    . LUNG REMOVAL, PARTIAL    . TUBAL LIGATION      There were no vitals filed for this visit.      Subjective Assessment - 02/16/17 1103    Subjective Pt. noting "my ankle feels better but my R knee feels worse today".     Patient Stated Goals improve balance and ability to walk   Currently in Pain? Yes   Pain Score 1    Pain Location Ankle   Pain Orientation Left   Pain Descriptors / Indicators Sore   Pain Type Surgical pain   Pain Onset More than a month ago   Pain Frequency Intermittent   Aggravating Factors  walking, wt. bearing    Pain Relieving Factors rest   Multiple Pain Sites Yes   Pain Score 4   Pain Location Knee   Pain Orientation Right   Pain Descriptors / Indicators Aching;Dull  sharpens with pressure (wt. bearing)   Pain Onset More than a month ago   Pain Frequency Intermittent   Aggravating Factors  walking, wt.  bearing    Pain Relieving Factors rest            [facility] PT Assessment - 02/16/17 1115      Assessment   Next MD Visit ~ 11.18.18                     [facility] Adult PT Treatment/Exercise - 02/16/17 1118      Vasopneumatic   Number Minutes Vasopneumatic  15 minutes   Vasopnuematic Location  Ankle   Vasopneumatic Pressure High   Vasopneumatic Temperature  coldest temp     Ankle Exercises: Aerobic   Stationary Bike NuStep: lvl 5, 6 min      Ankle Exercises: Standing   SLS Alternating SLS at counter 3" x 10 reps each side    Heel Raises 10 reps  2 sets; stopped at 10 reps due to knee pain    Heel Raises Limitations B UE support    Toe Raise 10 reps  2 sets; only 10 reps due to knee pain    Balance Beam side stepping with yellow tband at forefoot 2 x 3 laps   stopped at 3 laps due to R knee  pain    Other Standing Ankle Exercises L LE step down from 1 AirEx x 20     Ankle Exercises: Seated   Other Seated Ankle Exercises inversion, eversion, DF, PF x 20 - red tband, (green TB for PF)                PT Education - 02/16/17 1146    Education provided Yes   Education Details SLS (only 3 sec at time to start) x 5 each LE   Person(s) Educated Patient   Methods Explanation;Demonstration;Verbal cues;Handout   Comprehension Verbalized understanding;Returned demonstration;Verbal cues required;Need further instruction          PT Short Term Goals - 02/03/17 1126      PT SHORT TERM GOAL #1   Title patient to be independent with initial HEP   Status Achieved           PT Long Term Goals - 02/10/17 1119      PT LONG TERM GOAL #1   Title Patient to be independent with advanced HEP   Status On-going     PT LONG TERM GOAL #2   Title patient to improve L ankle/foot/LE strength to >/= 4+/5 without pain   Status Partially Met     PT LONG TERM GOAL #3   Title patient to demonstrate good heel to gait mechanics over various surfaces with no evidence of  LOB   Status On-going     PT LONG TERM GOAL #4   Title patient to report ability to ambulate/stand for >/=1 hour in order to allow for return to work   Status On-going  Notes ~ 3 min standing without needing sitting rest break due to pain      PT LONG TERM GOAL #5   Title patient to demonstrate L SL stance for >/= 10 seconds   Status On-going               Plan - 02/16/17 1112    Clinical Impression Statement Pt. noting she was able to go to grocery store and walk with cart for 20 min before needing break yesterday.  Pt. noting, "I'm happy with the progress at L ankle, however it's my R knee that is bothering me most now".  Pt. tolerated all standing proprioception and strengthening activities well today however needing sitting rest break periodically throughout therex due to R knee pain, which quickly resolved with rest.  Pt. progressing well.     PT Treatment/Interventions ADLs/Self Care Home Management;Cryotherapy;Electrical Stimulation;Iontophoresis 92m/ml Dexamethasone;Moist Heat;Ultrasound;Neuromuscular re-education;Balance training;Therapeutic exercise;Therapeutic activities;Functional mobility training;Stair training;Gait training;Patient/family education;Manual techniques;Passive range of motion;Vasopneumatic Device;Taping;Dry needling;Scar mobilization      Patient will benefit from skilled therapeutic intervention in order to improve the following deficits and impairments:  Abnormal gait, Decreased activity tolerance, Decreased balance, Decreased range of motion, Decreased mobility, Decreased strength, Difficulty walking, Pain  Visit Diagnosis: Pain in left ankle and joints of left foot  Stiffness of left ankle, not elsewhere classified  Difficulty in walking, not elsewhere classified  Other abnormalities of gait and mobility  Muscle weakness (generalized)     Problem List Patient Active Problem List   Diagnosis Date Noted  . Idiopathic chronic venous  hypertension of both lower extremities with inflammation 06/29/2016  . Subtalar joint instability, left 06/29/2016  . Arthritis of right knee 01/30/2016  . Lower extremity edema 10/21/2015  . Leg cramps 08/21/2015  . Left ankle pain 04/09/2015  . Anxiety 01/20/2015  . Aortic stenosis, mild 01/08/2015  .  Morbid obesity ([place]) 01/08/2015  . Varicose veins 01/02/2015  . Snoring 12/06/2014  . Viral gastroenteritis 04/06/2013  . Insect bite of ankle, right, infected 07/31/2012  . Primary cancer of right upper lobe of lung ([place]) 10/18/2011  . Hip pain 06/29/2011  . Pain in joint, lower leg 03/17/2009  . GASTROENTERITIS 07/11/2008  . PULMONARY NODULE, SOLITARY 04/18/2008  . LIVER FUNCTION TESTS, ABNORMAL, HX OF 04/01/2008  . B12 DEFICIENCY 11/13/2007  . COUGH 11/13/2007  . UNSPECIFIED VITAMIN D DEFICIENCY 11/07/2007  . TINEA PEDIS 07/07/2007  . LYMPHADENOPATHY 11/16/2006  . Hyperlipidemia 09/15/2006  . Essential hypertension 09/15/2006  . LOW BACK PAIN 09/15/2006    Bess Harvest, PTA 02/16/17 11:54 AM  [facility] [address]  [place], [place], [address] Phone: [phone]   Fax:  [phone]  Name: JAMIA HOBAN MRN: [id number] Date of Birth: 04/29/1951

## 2017-02-17 DIAGNOSIS — M25561 Pain in right knee: Secondary | ICD-10-CM | POA: Diagnosis not present

## 2017-02-17 DIAGNOSIS — M1711 Unilateral primary osteoarthritis, right knee: Secondary | ICD-10-CM | POA: Diagnosis not present

## 2017-02-21 ENCOUNTER — Ambulatory Visit: Payer: Medicare Other | Admitting: Physical Therapy

## 2017-02-24 ENCOUNTER — Ambulatory Visit: Payer: Medicare Other | Admitting: Physical Therapy

## 2017-02-28 ENCOUNTER — Ambulatory Visit: Payer: Medicare Other

## 2017-02-28 DIAGNOSIS — R262 Difficulty in walking, not elsewhere classified: Secondary | ICD-10-CM

## 2017-02-28 DIAGNOSIS — M25672 Stiffness of left ankle, not elsewhere classified: Secondary | ICD-10-CM

## 2017-02-28 DIAGNOSIS — M6281 Muscle weakness (generalized): Secondary | ICD-10-CM

## 2017-02-28 DIAGNOSIS — M25572 Pain in left ankle and joints of left foot: Secondary | ICD-10-CM

## 2017-02-28 DIAGNOSIS — R2689 Other abnormalities of gait and mobility: Secondary | ICD-10-CM

## 2017-02-28 NOTE — Therapy (Signed)
[facility] [address]  [place], [place], [address] Phone: [phone]   Fax:  [phone]  Physical Therapy Treatment  Patient Details  Name: Elizabeth [name] MRN: [id number] Date of Birth: 06-12-1950 Referring Provider: Dr. Bernarda Caffey  Encounter Date: 02/28/2017      PT End of Session - 02/28/17 1114    Visit Number 13   Number of Visits 22   Date for PT Re-Evaluation 03/28/17   Authorization Type Medicare   PT Start Time 1105   PT Stop Time 1207   PT Time Calculation (min) 62 min   Activity Tolerance Patient tolerated treatment well   Behavior During Therapy [facility] for tasks assessed/performed      Past Medical History:  Diagnosis Date  . Aortic stenosis, mild 01/08/2015  . Cancer ([place]) lung    lung  . Hx of cancer of lung 6/10   Removed June 2010  . Hyperlipidemia   . Hypertension   . Low back pain   . Morbid obesity ([place]) 01/08/2015  . Vitamin D deficiency     Past Surgical History:  Procedure Laterality Date  . ANKLE FUSION Left   . CHOLECYSTECTOMY    . LUNG REMOVAL, PARTIAL    . TUBAL LIGATION      There were no vitals filed for this visit.      Subjective Assessment - 02/28/17 1107    Subjective Pt. reporting she has an appointment with "knee doctor" 12.10.18.  Pt. reporting, "I feel the ankle is coming along really well!  It's just my R knee that is bothering me the most now."     Patient Stated Goals improve balance and ability to walk   Currently in Pain? Yes   Pain Score 3    Pain Location Knee   Pain Orientation Right   Pain Descriptors / Indicators Sharp   Pain Type Surgical pain   Pain Onset More than a month ago   Aggravating Factors  walking, wt. bearing   Pain Relieving Factors rest   Multiple Pain Sites No            [facility] PT Assessment - 02/28/17 1111      Strength   Strength Assessment Site Hip;Knee;Ankle   Right/Left Hip Right;Left   Right Hip Flexion  4+/5   Right Hip Extension 4/5   Right Hip External Rotation  4/5   Right Hip Internal Rotation 4/5   Right Hip ABduction 4+/5   Right Hip ADduction 4+/5   Left Hip Flexion 4+/5   Left Hip Extension 4/5   Left Hip External Rotation 4/5   Left Hip Internal Rotation 4/5   Left Hip ABduction 4+/5   Left Hip ADduction 4+/5   Right/Left Knee Right;Left   Right Knee Flexion 4+/5   Right Knee Extension 4+/5   Left Knee Flexion 4+/5   Left Knee Extension 4+/5   Right/Left Ankle Right;Left   Right Ankle Dorsiflexion 4+/5   Left Ankle Dorsiflexion 4+/5   Left Ankle Plantar Flexion 4+/5   Left Ankle Inversion 4+/5                     [facility] Adult PT Treatment/Exercise - 02/28/17 1111      Ambulation/Gait   Ambulation/Gait Yes   Ambulation/Gait Assistance 5: Supervision   Ambulation Distance (Feet) 200 Feet  100 ft without AD, 100 ft with [facility]   Assistive device Straight cane;None  Gait Comments Required verbal cueing for upright posture however improved heel-toe progression; pt. reports she has less R knee pain ambulating without AD      Knee/Hip Exercises: Aerobic   Nustep --     Knee/Hip Exercises: Standing   Lateral Step Up Right;Left;5 reps;Step Height: 4"   Lateral Step Up Limitations with airex pad on 4" riser; with Heavy UE support on TM rail    Forward Step Up Left;Step Height: 4";10 reps   Forward Step Up Limitations Heavy UE support on TM rail and SPC    SLS L SLS 2 x 20 sec; light UE support on chair    SLS with Vectors L SLS on airex pad with R LE cone touch x 30 sec      Vasopneumatic   Number Minutes Vasopneumatic  15 minutes   Vasopnuematic Location  Ankle   Vasopneumatic Pressure High   Vasopneumatic Temperature  coldest temp     Ankle Exercises: Aerobic   Stationary Bike NuStep: lvl 5, 6 min                   PT Short Term Goals - 02/03/17 1126      PT SHORT TERM GOAL #1   Title patient to be independent with initial HEP   Status  Achieved           PT Long Term Goals - 02/28/17 1119      PT LONG TERM GOAL #1   Title Patient to be independent with advanced HEP   Status On-going     PT LONG TERM GOAL #2   Title patient to improve L ankle/foot/LE strength to >/= 4+/5 without pain   Status Partially Met     PT LONG TERM GOAL #3   Title patient to demonstrate good heel to gait mechanics over various surfaces with no evidence of LOB   Status On-going     PT LONG TERM GOAL #4   Title patient to report ability to ambulate/stand for >/=1 hour in order to allow for return to work   Status On-going  ~ 5 min walking/standing without needing sitting rest break due to R knee pain      PT LONG TERM GOAL #5   Title patient to demonstrate L SL stance for >/= 10 seconds   Status Achieved               Plan - 02/28/17 1115    Clinical Impression Statement Pt. primary complaint today is R knee pain with walking and wt. bearing activities.  Shenika has made good progress with therapy stating, "I feel the ankle is coming along really well!  It's just my R knee that's bothering me now".  Partially met strength goal today however reports needing to sit after walking/standing 5 min due to R knee pain.  Tolerated L SLS >20 sec without ankle pain today and no issues with advancement of proprioception and strengthening activities.  Will continue to benefit from further skilled therapy to improve functional strength, ROM, and gait mechanics to improve safety.     PT Frequency 2x / week   PT Duration 4 weeks   PT Treatment/Interventions ADLs/Self Care Home Management;Cryotherapy;Electrical Stimulation;Iontophoresis 65m/ml Dexamethasone;Moist Heat;Ultrasound;Neuromuscular re-education;Balance training;Therapeutic exercise;Therapeutic activities;Functional mobility training;Stair training;Gait training;Patient/family education;Manual techniques;Passive range of motion;Vasopneumatic Device;Taping;Dry needling;Scar mobilization    Consulted and Agree with Plan of Care Patient      Patient will benefit from skilled therapeutic intervention in order to improve the following  deficits and impairments:  Abnormal gait, Decreased activity tolerance, Decreased balance, Decreased range of motion, Decreased mobility, Decreased strength, Difficulty walking, Pain  Visit Diagnosis: Pain in left ankle and joints of left foot - Plan: PT plan of care cert/re-cert  Stiffness of left ankle, not elsewhere classified - Plan: PT plan of care cert/re-cert  Difficulty in walking, not elsewhere classified - Plan: PT plan of care cert/re-cert  Other abnormalities of gait and mobility - Plan: PT plan of care cert/re-cert  Muscle weakness (generalized) - Plan: PT plan of care cert/re-cert     Problem List Patient Active Problem List   Diagnosis Date Noted  . Idiopathic chronic venous hypertension of both lower extremities with inflammation 06/29/2016  . Subtalar joint instability, left 06/29/2016  . Arthritis of right knee 01/30/2016  . Lower extremity edema 10/21/2015  . Leg cramps 08/21/2015  . Left ankle pain 04/09/2015  . Anxiety 01/20/2015  . Aortic stenosis, mild 01/08/2015  . Morbid obesity ([place]) 01/08/2015  . Varicose veins 01/02/2015  . Snoring 12/06/2014  . Viral gastroenteritis 04/06/2013  . Insect bite of ankle, right, infected 07/31/2012  . Primary cancer of right upper lobe of lung ([place]) 10/18/2011  . Hip pain 06/29/2011  . Pain in joint, lower leg 03/17/2009  . GASTROENTERITIS 07/11/2008  . PULMONARY NODULE, SOLITARY 04/18/2008  . LIVER FUNCTION TESTS, ABNORMAL, HX OF 04/01/2008  . B12 DEFICIENCY 11/13/2007  . COUGH 11/13/2007  . UNSPECIFIED VITAMIN D DEFICIENCY 11/07/2007  . TINEA PEDIS 07/07/2007  . LYMPHADENOPATHY 11/16/2006  . Hyperlipidemia 09/15/2006  . Essential hypertension 09/15/2006  . LOW BACK PAIN 09/15/2006    Bess Harvest, PTA 02/28/17 6:14 PM   Lanney Gins, PT, DPT 02/28/17 [facility] [address]  [place], [place], [address] Phone: [phone]   Fax:  [phone]  Name: LEXIS POTENZA MRN: [id number] Date of Birth: 26-May-1950

## 2017-03-02 ENCOUNTER — Ambulatory Visit: Payer: Medicare Other | Admitting: Physical Therapy

## 2017-03-02 DIAGNOSIS — M25572 Pain in left ankle and joints of left foot: Secondary | ICD-10-CM | POA: Diagnosis not present

## 2017-03-02 DIAGNOSIS — M25672 Stiffness of left ankle, not elsewhere classified: Secondary | ICD-10-CM

## 2017-03-02 DIAGNOSIS — R2689 Other abnormalities of gait and mobility: Secondary | ICD-10-CM

## 2017-03-02 DIAGNOSIS — M6281 Muscle weakness (generalized): Secondary | ICD-10-CM | POA: Diagnosis not present

## 2017-03-02 DIAGNOSIS — R262 Difficulty in walking, not elsewhere classified: Secondary | ICD-10-CM

## 2017-03-02 NOTE — Therapy (Signed)
[facility] [address]  [place], [place], [address] Phone: [phone]   Fax:  [phone]  Physical Therapy Treatment  Patient Details  Name: Elizabeth [name] MRN: [id number] Date of Birth: Mar 26, 1951 Referring Provider: Dr. Bernarda Caffey  Encounter Date: 03/02/2017      PT End of Session - 03/02/17 1158    Visit Number 14   Number of Visits 22   Date for PT Re-Evaluation 03/28/17   Authorization Type Medicare   PT Start Time 1104   PT Stop Time 1157   PT Time Calculation (min) 53 min   Activity Tolerance Patient tolerated treatment well   Behavior During Therapy [facility] for tasks assessed/performed      Past Medical History:  Diagnosis Date  . Aortic stenosis, mild 01/08/2015  . Cancer ([place]) lung    lung  . Hx of cancer of lung 6/10   Removed June 2010  . Hyperlipidemia   . Hypertension   . Low back pain   . Morbid obesity ([place]) 01/08/2015  . Vitamin D deficiency     Past Surgical History:  Procedure Laterality Date  . ANKLE FUSION Left   . CHOLECYSTECTOMY    . LUNG REMOVAL, PARTIAL    . TUBAL LIGATION      There were no vitals filed for this visit.      Subjective Assessment - 03/02/17 1148    Subjective ankle is doing well - most pain at knee - seeing ortho MD on 12/10   Diagnostic tests Xray - normal healing   Patient Stated Goals improve balance and ability to walk   Currently in Pain? No/denies   Pain Score 0-No pain   Pain Location Ankle   Pain Orientation Left   Pain Score 7   Pain Location Knee   Pain Orientation Right   Pain Descriptors / Indicators Aching;Constant                         [facility] Adult PT Treatment/Exercise - 03/02/17 0001      Knee/Hip Exercises: Aerobic   Stationary Bike no resistance x 7 min     Knee/Hip Exercises: Standing   Lateral Step Up Left;Step Height: 6";Hand Hold: 2;15 reps   Lateral Step Up Limitations B UE support from PT   Forward Step Up Left;2 sets;10 reps;Hand Hold: 2;Step Height: 6"   Forward Step Up Limitations B UE support from PT; 1 set with forward step down, 1 set with posterior step down   SLS with Vectors L SLS with R LE pebble reaches outside BOS and across body - B UE support from PT     Vasopneumatic   Number Minutes Vasopneumatic  10 minutes   Vasopnuematic Location  Ankle   Vasopneumatic Pressure Medium   Vasopneumatic Temperature  coldest temp     Ankle Exercises: Seated   Towel Crunch 1 rep  towel on slight wedge - difficulty without wrinkles in towel   [place] Pickup 2 sets x 12                   PT Short Term Goals - 02/03/17 1126      PT SHORT TERM GOAL #1   Title patient to be independent with initial HEP   Status Achieved           PT Long Term Goals - 02/28/17 1119      PT LONG TERM  GOAL #1   Title Patient to be independent with advanced HEP   Status On-going     PT LONG TERM GOAL #2   Title patient to improve L ankle/foot/LE strength to >/= 4+/5 without pain   Status Partially Met     PT LONG TERM GOAL #3   Title patient to demonstrate good heel to gait mechanics over various surfaces with no evidence of LOB   Status On-going     PT LONG TERM GOAL #4   Title patient to report ability to ambulate/stand for >/=1 hour in order to allow for return to work   Status On-going  ~ 5 min walking/standing without needing sitting rest break due to R knee pain      PT LONG TERM GOAL #5   Title patient to demonstrate L SL stance for >/= 10 seconds   Status Achieved               Plan - 03/02/17 1153    Clinical Impression Statement Elizabeth [name] doing well today. Reporting most issues with R knee pain and general reduced enduance - upon further investigation patient with primary symptoms of SOB upon exertion throughout session limiting activity tolerance. Patient with most difficulty today during anterior step down with tendency to externally rotate LE rather  than complete forward step down. Will continue to progress as patient tolerates.    PT Treatment/Interventions ADLs/Self Care Home Management;Cryotherapy;Electrical Stimulation;Iontophoresis 48m/ml Dexamethasone;Moist Heat;Ultrasound;Neuromuscular re-education;Balance training;Therapeutic exercise;Therapeutic activities;Functional mobility training;Stair training;Gait training;Patient/family education;Manual techniques;Passive range of motion;Vasopneumatic Device;Taping;Dry needling;Scar mobilization   Consulted and Agree with Plan of Care Patient      Patient will benefit from skilled therapeutic intervention in order to improve the following deficits and impairments:  Abnormal gait, Decreased activity tolerance, Decreased balance, Decreased range of motion, Decreased mobility, Decreased strength, Difficulty walking, Pain  Visit Diagnosis: Pain in left ankle and joints of left foot  Stiffness of left ankle, not elsewhere classified  Difficulty in walking, not elsewhere classified  Other abnormalities of gait and mobility  Muscle weakness (generalized)     Problem List Patient Active Problem List   Diagnosis Date Noted  . Idiopathic chronic venous hypertension of both lower extremities with inflammation 06/29/2016  . Subtalar joint instability, left 06/29/2016  . Arthritis of right knee 01/30/2016  . Lower extremity edema 10/21/2015  . Leg cramps 08/21/2015  . Left ankle pain 04/09/2015  . Anxiety 01/20/2015  . Aortic stenosis, mild 01/08/2015  . Morbid obesity ([place] 01/08/2015  . Varicose veins 01/02/2015  . Snoring 12/06/2014  . Viral gastroenteritis 04/06/2013  . Insect bite of ankle, right, infected 07/31/2012  . [place] of [place] 10/18/2011  . Hip pain 06/29/2011  . Pain in joint, lower leg 03/17/2009  . GASTROENTERITIS 07/11/2008  . PULMONARY NODULE, SOLITARY 04/18/2008  . LIVER FUNCTION TESTS, ABNORMAL, HX OF 04/01/2008  . B12  DEFICIENCY 11/13/2007  . COUGH 11/13/2007  . UNSPECIFIED VITAMIN D DEFICIENCY 11/07/2007  . TINEA PEDIS 07/07/2007  . LYMPHADENOPATHY 11/16/2006  . Hyperlipidemia 09/15/2006  . Essential hypertension 09/15/2006  . LOW BACK PAIN 09/15/2006     SLanney Gins PT, DPT 03/02/17 11:59 AM   [facility] [place] [address]Phone: [phone]  Fax:  [phone] Name: SRAINNA NEARHOODMRN: [id number] of Birth: [birth year]

## 2017-03-07 ENCOUNTER — Encounter: Payer: Self-pay | Admitting: Physical Therapy

## 2017-03-07 ENCOUNTER — Ambulatory Visit: Payer: Medicare Other | Attending: Orthopedic Surgery | Admitting: Physical Therapy

## 2017-03-07 DIAGNOSIS — R2689 Other abnormalities of gait and mobility: Secondary | ICD-10-CM | POA: Diagnosis not present

## 2017-03-07 DIAGNOSIS — M25572 Pain in left ankle and joints of left foot: Secondary | ICD-10-CM | POA: Diagnosis not present

## 2017-03-07 DIAGNOSIS — M25672 Stiffness of left ankle, not elsewhere classified: Secondary | ICD-10-CM

## 2017-03-07 DIAGNOSIS — R262 Difficulty in walking, not elsewhere classified: Secondary | ICD-10-CM | POA: Insufficient documentation

## 2017-03-07 DIAGNOSIS — M6281 Muscle weakness (generalized): Secondary | ICD-10-CM | POA: Insufficient documentation

## 2017-03-07 NOTE — Therapy (Signed)
[facility] [address]  [place], [place], [address] Phone: [phone]   Fax:  [phone]  Physical Therapy Treatment  Patient Details  Name: Elizabeth [name] MRN: [id number] Date of Birth: 26-Apr-1951 Referring Provider: Dr. Bernarda Caffey   Encounter Date: 03/07/2017  PT End of Session - 03/07/17 1105    Visit Number  15    Number of Visits  22    Date for PT Re-Evaluation  03/28/17    Authorization Type  Medicare    PT Start Time  1103    PT Stop Time  1158    PT Time Calculation (min)  55 min    Activity Tolerance  Patient tolerated treatment well    Behavior During Therapy  [facility] for tasks assessed/performed       Past Medical History:  Diagnosis Date  . Aortic stenosis, mild 01/08/2015  . Cancer ([place]) lung    lung  . Hx of cancer of lung 6/10   Removed June 2010  . Hyperlipidemia   . Hypertension   . Low back pain   . Morbid obesity ([place]) 01/08/2015  . Vitamin D deficiency     Past Surgical History:  Procedure Laterality Date  . ANKLE FUSION Left   . CHOLECYSTECTOMY    . LUNG REMOVAL, PARTIAL    . TUBAL LIGATION      There were no vitals filed for this visit.  Subjective Assessment - 03/07/17 1432    Subjective  Ankle feeling well, continued knee pain    Limitations  Standing;Walking    Diagnostic tests  Xray - normal healing    Patient Stated Goals  improve balance and ability to walk    Currently in Pain?  No/denies    Pain Score  0-No pain    Pain Location  Ankle    Pain Orientation  Left    Pain Score  6    Pain Location  Knee    Pain Orientation  Right                      [facility] Adult PT Treatment/Exercise - 03/07/17 1106      Knee/Hip Exercises: Aerobic   Stationary Bike  no resistance x 6 min      Knee/Hip Exercises: Standing   Heel Raises  Both;15 reps    Heel Raises Limitations  little height; difficulty with SL or eccentric work with L LE    Hip Flexion   Right;Left;15 reps;Knee straight    Hip Flexion Limitations  L Foot on Foam; red tband    Hip ADduction  Right;Left;15 reps    Hip ADduction Limitations  L Foot on Foam; red tband    Hip Abduction  Right;Left;15 reps;Knee straight    Abduction Limitations  L Foot on Foam; red tband    Hip Extension  Right;Left;15 reps;Knee straight    Extension Limitations  L Foot on Foam; red tband    Other Standing Knee Exercises  side stepping with yellow tband at forefoot x 20 feet each direction      Vasopneumatic   Number Minutes Vasopneumatic   15 minutes    Vasopnuematic Location   Ankle    Vasopneumatic Pressure  High    Vasopneumatic Temperature   coldest temp               PT Short Term Goals - 02/03/17 1126      PT SHORT  TERM GOAL #1   Title  patient to be independent with initial HEP    Status  Achieved        PT Long Term Goals - 02/28/17 1119      PT LONG TERM GOAL #1   Title  Patient to be independent with advanced HEP    Status  On-going      PT LONG TERM GOAL #2   Title  patient to improve L ankle/foot/LE strength to >/= 4+/5 without pain    Status  Partially Met      PT LONG TERM GOAL #3   Title  patient to demonstrate good heel to gait mechanics over various surfaces with no evidence of LOB    Status  On-going      PT LONG TERM GOAL #4   Title  patient to report ability to ambulate/stand for >/=1 hour in order to allow for return to work    Status  On-going ~ 5 min walking/standing without needing sitting rest break due to R knee pain    ~ 5 min walking/standing without needing sitting rest break due to R knee pain      PT LONG TERM GOAL #5   Title  patient to demonstrate L SL stance for >/= 10 seconds    Status  Achieved            Plan - 03/07/17 1106    Clinical Impression Statement  Patient doing well today. Feels like most issue at this time is raising up onto toes as well as general fatigue limiting activity tolerance. PT session today  focusing on general strengthening program with good tolerance, however R knee pain requiring seated rest breaks throughout session. Will continue to progress as patient tolerates.     PT Treatment/Interventions  ADLs/Self Care Home Management;Cryotherapy;Electrical Stimulation;Iontophoresis '4mg'$ /ml Dexamethasone;Moist Heat;Ultrasound;Neuromuscular re-education;Balance training;Therapeutic exercise;Therapeutic activities;Functional mobility training;Stair training;Gait training;Patient/family education;Manual techniques;Passive range of motion;Vasopneumatic Device;Taping;Dry needling;Scar mobilization    Consulted and Agree with Plan of Care  Patient       Patient will benefit from skilled therapeutic intervention in order to improve the following deficits and impairments:  Abnormal gait, Decreased activity tolerance, Decreased balance, Decreased range of motion, Decreased mobility, Decreased strength, Difficulty walking, Pain  Visit Diagnosis: Pain in left ankle and joints of left foot  Stiffness of left ankle, not elsewhere classified  Difficulty in walking, not elsewhere classified  Other abnormalities of gait and mobility  Muscle weakness (generalized)     Problem List Patient Active Problem List   Diagnosis Date Noted  . Idiopathic chronic venous hypertension of both lower extremities with inflammation 06/29/2016  . Subtalar joint instability, left 06/29/2016  . Arthritis of right knee 01/30/2016  . Lower extremity edema 10/21/2015  . Leg cramps 08/21/2015  . Left ankle pain 04/09/2015  . Anxiety 01/20/2015  . Aortic stenosis, mild 01/08/2015  . Morbid obesity ([place]) 01/08/2015  . Varicose veins 01/02/2015  . Snoring 12/06/2014  . Viral gastroenteritis 04/06/2013  . Insect bite of ankle, right, infected 07/31/2012  . Primary cancer of right upper lobe of lung ([place]) 10/18/2011  . Hip pain 06/29/2011  . Pain in joint, lower leg 03/17/2009  . GASTROENTERITIS 07/11/2008  .  PULMONARY NODULE, SOLITARY 04/18/2008  . LIVER FUNCTION TESTS, ABNORMAL, HX OF 04/01/2008  . B12 DEFICIENCY 11/13/2007  . COUGH 11/13/2007  . UNSPECIFIED VITAMIN D DEFICIENCY 11/07/2007  . TINEA PEDIS 07/07/2007  . LYMPHADENOPATHY 11/16/2006  . Hyperlipidemia 09/15/2006  . Essential hypertension  09/15/2006  . LOW BACK PAIN 09/15/2006     Lanney Gins, PT, DPT 03/07/17 3:41 PM  [facility] [address]  [place], [place], [address] Phone: [phone]   Fax:  [phone]  Name: Elizabeth [name] MRN: [id number] Date of Birth: 02-14-1951

## 2017-03-08 ENCOUNTER — Encounter: Payer: Self-pay | Admitting: Family Medicine

## 2017-03-08 ENCOUNTER — Other Ambulatory Visit: Payer: Self-pay | Admitting: Family Medicine

## 2017-03-08 DIAGNOSIS — E559 Vitamin D deficiency, unspecified: Secondary | ICD-10-CM

## 2017-03-10 ENCOUNTER — Ambulatory Visit: Payer: Medicare Other | Admitting: Physical Therapy

## 2017-03-10 ENCOUNTER — Encounter: Payer: Self-pay | Admitting: Physical Therapy

## 2017-03-10 DIAGNOSIS — R262 Difficulty in walking, not elsewhere classified: Secondary | ICD-10-CM | POA: Diagnosis not present

## 2017-03-10 DIAGNOSIS — M25572 Pain in left ankle and joints of left foot: Secondary | ICD-10-CM

## 2017-03-10 DIAGNOSIS — R2689 Other abnormalities of gait and mobility: Secondary | ICD-10-CM | POA: Diagnosis not present

## 2017-03-10 DIAGNOSIS — M25672 Stiffness of left ankle, not elsewhere classified: Secondary | ICD-10-CM | POA: Diagnosis not present

## 2017-03-10 DIAGNOSIS — M6281 Muscle weakness (generalized): Secondary | ICD-10-CM

## 2017-03-10 NOTE — Therapy (Signed)
[facility] [address]  [place], [place], [address] Phone: [phone]   Fax:  [phone]  Physical Therapy Treatment  Patient Details  Name: Elizabeth [name] MRN: [id number] Date of Birth: [birth year] Referring Provider: Dr. Bernarda Caffey   Encounter Date: 03/10/2017  PT End of Session - 03/10/17 1311    Visit Number  16    Number of Visits  22    Date for PT Re-Evaluation  03/28/17    Authorization Type  Medicare    PT Start Time  1101    PT Stop Time  1159    PT Time Calculation (min)  58 min    Activity Tolerance  Patient tolerated treatment well    Behavior During Therapy  [facility] for tasks assessed/performed       Past Medical History:  Diagnosis Date  . Aortic stenosis, mild 01/08/2015  . Cancer ([place]) lung    lung  . Hx of cancer of lung 6/10   Removed June 2010  . Hyperlipidemia   . Hypertension   . Low back pain   . Morbid obesity ([place]) 01/08/2015  . Vitamin D deficiency     Past Surgical History:  Procedure Laterality Date  . ANKLE FUSION Left   . CHOLECYSTECTOMY    . LUNG REMOVAL, PARTIAL    . TUBAL LIGATION      There were no vitals filed for this visit.  Subjective Assessment - 03/10/17 1104    Subjective  Feeling well this morning. Knee is about the same    Diagnostic tests  Xray - normal healing    Patient Stated Goals  improve balance and ability to walk    Currently in Pain?  No/denies    Pain Score  0-No pain    Pain Location  Ankle    Pain Orientation  Left                      [facility] Adult PT Treatment/Exercise - 03/10/17 0001      Knee/Hip Exercises: Standing   Step Down  Left;2 sets;10 reps;Hand Hold: 2;Step Height: 4"    Other Standing Knee Exercises  lateral stepping onto 1 AirEx - B UE support      Knee/Hip Exercises: Seated   Hamstring Curl  Left;15 reps    Hamstring Limitations  green tband      Vasopneumatic   Number Minutes Vasopneumatic   15  minutes    Vasopnuematic Location   Ankle    Vasopneumatic Pressure  High    Vasopneumatic Temperature   coldest temp      Ankle Exercises: Aerobic   Stationary Bike  NuStep: lvl 5, 6 min       Ankle Exercises: Seated   Heel Raises  20 reps;3 seconds    Toe Raise  20 reps;3 seconds    Other Seated Ankle Exercises  4 way ankle resist - red tband x 15 reps each      Ankle Exercises: Standing   Other Standing Ankle Exercises  fwd/bwd tandem walking - B UE support from PT x 20 feet each way    Other Standing Ankle Exercises  narrow BOS on foam with reaches/tosses out of BOS               PT Short Term Goals - 02/03/17 1126      PT SHORT TERM GOAL #1   Title  patient to be independent  with initial HEP    Status  Achieved        PT Long Term Goals - 02/28/17 1119      PT LONG TERM GOAL #1   Title  Patient to be independent with advanced HEP    Status  On-going      PT LONG TERM GOAL #2   Title  patient to improve L ankle/foot/LE strength to >/= 4+/5 without pain    Status  Partially Met      PT LONG TERM GOAL #3   Title  patient to demonstrate good heel to gait mechanics over various surfaces with no evidence of LOB    Status  On-going      PT LONG TERM GOAL #4   Title  patient to report ability to ambulate/stand for >/=1 hour in order to allow for return to work    Status  On-going ~ 5 min walking/standing without needing sitting rest break due to R knee pain       PT LONG TERM GOAL #5   Title  patient to demonstrate L SL stance for >/= 10 seconds    Status  Achieved            Plan - 03/10/17 1312    Clinical Impression Statement  Patient with subjective reports of L ankle feeling well - some fatigue at L lower leg limiting activity tolerance. Some limitations in PT session due to general fatigue/weakness as well as SOB requiring seated rest breask after each task. Patient education on switching SPC to L UE to allow for more weight/force to be accepted  onto L ankle and some weight reduction from R knee (due to knee pain). Will continue to progress as patient tolerates.     PT Treatment/Interventions  ADLs/Self Care Home Management;Cryotherapy;Electrical Stimulation;Iontophoresis 28m/ml Dexamethasone;Moist Heat;Ultrasound;Neuromuscular re-education;Balance training;Therapeutic exercise;Therapeutic activities;Functional mobility training;Stair training;Gait training;Patient/family education;Manual techniques;Passive range of motion;Vasopneumatic Device;Taping;Dry needling;Scar mobilization    Consulted and Agree with Plan of Care  Patient       Patient will benefit from skilled therapeutic intervention in order to improve the following deficits and impairments:  Abnormal gait, Decreased activity tolerance, Decreased balance, Decreased range of motion, Decreased mobility, Decreased strength, Difficulty walking, Pain  Visit Diagnosis: Pain in left ankle and joints of left foot  Stiffness of left ankle, not elsewhere classified  Difficulty in walking, not elsewhere classified  Other abnormalities of gait and mobility  Muscle weakness (generalized)     Problem List Patient Active Problem List   Diagnosis Date Noted  . Idiopathic chronic venous hypertension of both lower extremities with inflammation 06/29/2016  . Subtalar joint instability, left 06/29/2016  . Arthritis of right knee 01/30/2016  . Lower extremity edema 10/21/2015  . Leg cramps 08/21/2015  . Left ankle pain 04/09/2015  . Anxiety 01/20/2015  . Aortic stenosis, mild 01/08/2015  . [place] 01/08/2015  . Varicose veins 01/02/2015  . Snoring 12/06/2014  . Viral gastroenteritis 04/06/2013  . Insect bite of ankle, right, infected 07/31/2012  . [place]  . Hip pain 06/29/2011  . Pain in joint, lower leg 03/17/2009  . GASTROENTERITIS 07/11/2008  . PULMONARY NODULE, SOLITARY 04/18/2008  . LIVER FUNCTION TESTS,  ABNORMAL, HX OF 04/01/2008  . B12 DEFICIENCY 11/13/2007  . COUGH 11/13/2007  . UNSPECIFIED VITAMIN D DEFICIENCY 11/07/2007  . TINEA PEDIS 07/07/2007  . LYMPHADENOPATHY 11/16/2006  . Hyperlipidemia 09/15/2006  . Essential hypertension 09/15/2006  .  LOW BACK PAIN 09/15/2006      Lanney Gins, PT, DPT 03/10/17 1:20 PM   [facility] [address]  [place], [place], [address] Phone: [phone]   Fax:  [phone]  Name: JACYNDA BRUNKE MRN: [id number] Date of Birth: 09-Sep-1950

## 2017-03-14 ENCOUNTER — Ambulatory Visit: Payer: Medicare Other

## 2017-03-14 DIAGNOSIS — M25572 Pain in left ankle and joints of left foot: Secondary | ICD-10-CM | POA: Diagnosis not present

## 2017-03-14 DIAGNOSIS — M25672 Stiffness of left ankle, not elsewhere classified: Secondary | ICD-10-CM

## 2017-03-14 DIAGNOSIS — M6281 Muscle weakness (generalized): Secondary | ICD-10-CM

## 2017-03-14 DIAGNOSIS — R262 Difficulty in walking, not elsewhere classified: Secondary | ICD-10-CM | POA: Diagnosis not present

## 2017-03-14 DIAGNOSIS — R2689 Other abnormalities of gait and mobility: Secondary | ICD-10-CM

## 2017-03-14 NOTE — Therapy (Signed)
[facility] [address]  [place], [place], [address] Phone: [phone]   Fax:  [phone]  Physical Therapy Treatment  Patient Details  Name: Elizabeth [name] MRN: [id number] Date of Birth: 16-Apr-1951 Referring Provider: Dr. Bernarda Caffey   Encounter Date: 03/14/2017  PT End of Session - 03/14/17 1110    Visit Number  17    Number of Visits  22    Date for PT Re-Evaluation  03/28/17    Authorization Type  Medicare    PT Start Time  1104    PT Stop Time  1145    PT Time Calculation (min)  41 min    Activity Tolerance  Patient tolerated treatment well    Behavior During Therapy  [facility] for tasks assessed/performed       Past Medical History:  Diagnosis Date  . Aortic stenosis, mild 01/08/2015  . Cancer ([place]) lung    lung  . Hx of cancer of lung 6/10   Removed June 2010  . Hyperlipidemia   . Hypertension   . Low back pain   . Morbid obesity ([place]) 01/08/2015  . Vitamin D deficiency     Past Surgical History:  Procedure Laterality Date  . ANKLE FUSION Left   . CHOLECYSTECTOMY    . LUNG REMOVAL, PARTIAL    . TUBAL LIGATION      There were no vitals filed for this visit.  Subjective Assessment - 03/14/17 1106    Subjective  Pt. reporting R knee pain has been main limiting factor in activity over weekend.      Patient Stated Goals  improve balance and ability to walk    Currently in Pain?  Yes    Pain Score  5     Pain Location  Knee    Pain Orientation  Right    Pain Descriptors / Indicators  Sharp    Pain Type  Surgical pain    Pain Onset  More than a month ago    Pain Frequency  Intermittent    Aggravating Factors   walking    Pain Relieving Factors  rest    Effect of Pain on Daily Activities  limits walking and standing                      [facility] Adult PT Treatment/Exercise - 03/14/17 1115      Neuro Re-ed    Neuro Re-ed Details   2 x 8 cone nock over/put back with R LE  (focusing on L SLS)  with SPC       Knee/Hip Exercises: Stretches   Passive Hamstring Stretch  1 rep;30 seconds performed due to "crampy" feeling in L HS    Passive Hamstring Stretch Limitations  seated heel on 6" step with strap       Knee/Hip Exercises: Aerobic   Stationary Bike  no resistance x 6 min      Knee/Hip Exercises: Machines for Strengthening   Cybex Knee Flexion  20# B LE's x 10 reps; B con/ L ecc 10# x 15 reps       Knee/Hip Exercises: Standing   SLS with Vectors  L SLS with opposite LE star reach to pebbles 3 x 15 sec each; 2 ski poles     Other Standing Knee Exercises  staggered stance (R LE forwards) on two airex pads wt. shift forward/back x 15 rep s each way  Knee/Hip Exercises: Seated   Long Arc Quad  Left;10 reps 5" hold     Long Arc Quad Weight  2 lbs.    Other Seated Knee/Hip Exercises  Seated L leg press (1black, 1 blue band) 2 x 10 reps     Hamstring Curl  Right;Left;10 reps;Weights    Hamstring Limitations  standing; slight forward trunk lean on back of chair       Hamstring Weights  2 lbs.      Ankle Exercises: Seated   Other Seated Ankle Exercises  Seated L ankle red TB EV, IV, DF x 20 reps               PT Short Term Goals - 02/03/17 1126      PT SHORT TERM GOAL #1   Title  patient to be independent with initial HEP    Status  Achieved        PT Long Term Goals - 02/28/17 1119      PT LONG TERM GOAL #1   Title  Patient to be independent with advanced HEP    Status  On-going      PT LONG TERM GOAL #2   Title  patient to improve L ankle/foot/LE strength to >/= 4+/5 without pain    Status  Partially Met      PT LONG TERM GOAL #3   Title  patient to demonstrate good heel to gait mechanics over various surfaces with no evidence of LOB    Status  On-going      PT LONG TERM GOAL #4   Title  patient to report ability to ambulate/stand for >/=1 hour in order to allow for return to work    Status  On-going ~ 5 min walking/standing  without needing sitting rest break due to R knee pain       PT LONG TERM GOAL #5   Title  patient to demonstrate L SL stance for >/= 10 seconds    Status  Achieved            Plan - 03/14/17 1110    Clinical Impression Statement  Pt. reporting R knee pain has been main limiting factor with activity over weekend.  Elizabeth [name] doing well with all strengthening and proprioception training today however still requiring sitting rest break due to shortness of breath and general fatigue.  Progressing well toward goals.  Pt. declining ice to end treatment due to time constraints needing to pick up groceries.      PT Treatment/Interventions  ADLs/Self Care Home Management;Cryotherapy;Electrical Stimulation;Iontophoresis 67m/ml Dexamethasone;Moist Heat;Ultrasound;Neuromuscular re-education;Balance training;Therapeutic exercise;Therapeutic activities;Functional mobility training;Stair training;Gait training;Patient/family education;Manual techniques;Passive range of motion;Vasopneumatic Device;Taping;Dry needling;Scar mobilization    PT Next Visit Plan  MD note for appointment afternoon of 11/15       Patient will benefit from skilled therapeutic intervention in order to improve the following deficits and impairments:  Abnormal gait, Decreased activity tolerance, Decreased balance, Decreased range of motion, Decreased mobility, Decreased strength, Difficulty walking, Pain  Visit Diagnosis: Pain in left ankle and joints of left foot  Stiffness of left ankle, not elsewhere classified  Difficulty in walking, not elsewhere classified  Other abnormalities of gait and mobility  Muscle weakness (generalized)     Problem List Patient Active Problem List   Diagnosis Date Noted  . Idiopathic chronic venous hypertension of both lower extremities with inflammation 06/29/2016  . Subtalar joint instability, left 06/29/2016  . Arthritis of right knee 01/30/2016  . Lower extremity edema  10/21/2015  . Leg  cramps 08/21/2015  . Left ankle pain 04/09/2015  . Anxiety 01/20/2015  . Aortic stenosis, mild 01/08/2015  . Morbid obesity ([place]) 01/08/2015  . Varicose veins 01/02/2015  . Snoring 12/06/2014  . Viral gastroenteritis 04/06/2013  . Insect bite of ankle, right, infected 07/31/2012  . Primary cancer of right upper lobe of lung ([place]) 10/18/2011  . Hip pain 06/29/2011  . Pain in joint, lower leg 03/17/2009  . GASTROENTERITIS 07/11/2008  . PULMONARY NODULE, SOLITARY 04/18/2008  . LIVER FUNCTION TESTS, ABNORMAL, HX OF 04/01/2008  . B12 DEFICIENCY 11/13/2007  . COUGH 11/13/2007  . UNSPECIFIED VITAMIN D DEFICIENCY 11/07/2007  . TINEA PEDIS 07/07/2007  . LYMPHADENOPATHY 11/16/2006  . Hyperlipidemia 09/15/2006  . Essential hypertension 09/15/2006  . LOW BACK PAIN 09/15/2006    Bess Harvest, PTA 03/14/17 12:01 PM   [facility] [address]  [place], [place], [address] Phone: [phone]   Fax:  [phone]  Name: Elizabeth [name] MRN: [id number] Date of Birth: 1950/07/19

## 2017-03-17 ENCOUNTER — Encounter: Payer: Self-pay | Admitting: Physical Therapy

## 2017-03-17 ENCOUNTER — Ambulatory Visit: Payer: Medicare Other | Admitting: Physical Therapy

## 2017-03-17 DIAGNOSIS — M6281 Muscle weakness (generalized): Secondary | ICD-10-CM | POA: Diagnosis not present

## 2017-03-17 DIAGNOSIS — M19072 Primary osteoarthritis, left ankle and foot: Secondary | ICD-10-CM | POA: Diagnosis not present

## 2017-03-17 DIAGNOSIS — Z4789 Encounter for other orthopedic aftercare: Secondary | ICD-10-CM | POA: Diagnosis not present

## 2017-03-17 DIAGNOSIS — R2689 Other abnormalities of gait and mobility: Secondary | ICD-10-CM | POA: Diagnosis not present

## 2017-03-17 DIAGNOSIS — R262 Difficulty in walking, not elsewhere classified: Secondary | ICD-10-CM

## 2017-03-17 DIAGNOSIS — M25672 Stiffness of left ankle, not elsewhere classified: Secondary | ICD-10-CM

## 2017-03-17 DIAGNOSIS — M25572 Pain in left ankle and joints of left foot: Secondary | ICD-10-CM

## 2017-03-17 DIAGNOSIS — Z981 Arthrodesis status: Secondary | ICD-10-CM | POA: Diagnosis not present

## 2017-03-17 NOTE — Therapy (Addendum)
[facility] [address]  [place], [place], [address] Phone: [phone]   Fax:  [phone]  Physical Therapy Treatment  Patient Details  Name: Elizabeth [name] MRN: [id number] Date of Birth: 02-08-1951 Referring Provider: Dr. Bernarda Caffey   Encounter Date: 03/17/2017  PT End of Session - 03/17/17 1152    Visit Number  18    Number of Visits  22    Date for PT Re-Evaluation  03/28/17    Authorization Type  Medicare    PT Start Time  1110    PT Stop Time  1152    PT Time Calculation (min)  42 min    Activity Tolerance  Patient tolerated treatment well    Behavior During Therapy  Dr [facility] for tasks assessed/performed       Past Medical History:  Diagnosis Date  . Aortic stenosis, mild 01/08/2015  . Cancer ([place]) lung    lung  . Hx of cancer of lung 6/10   Removed June 2010  . Hyperlipidemia   . Hypertension   . Low back pain   . Morbid obesity ([place]) 01/08/2015  . Vitamin D deficiency     Past Surgical History:  Procedure Laterality Date  . ANKLE FUSION Left   . CHOLECYSTECTOMY    . LUNG REMOVAL, PARTIAL    . TUBAL LIGATION      There were no vitals filed for this visit.  Subjective Assessment - 03/17/17 1240    Subjective  Doing well - no cane today. Knee is greatest limitation    Limitations  Standing;Walking    Diagnostic tests  Xray - normal healing    Patient Stated Goals  improve balance and ability to walk    Currently in Pain?  No/denies    Pain Score  0-No pain    Pain Score  6    Pain Location  Knee    Pain Orientation  Right    Pain Descriptors / Indicators  Aching;Constant         [facility] PT Assessment - 03/17/17 0001      Assessment   Medical Diagnosis  s/p L ankle arthrodesis    Referring Provider  Dr. Bernarda Caffey      AROM   Left Ankle Dorsiflexion  4    Left Ankle Plantar Flexion  32    Left Ankle Inversion  10    Left Ankle Eversion  18      Strength   Right/Left  Ankle  Left    Left Ankle Dorsiflexion  4+/5    Left Ankle Plantar Flexion  4/5    Left Ankle Inversion  4/5    Left Ankle Eversion  4+/5                  [facility] Adult PT Treatment/Exercise - 03/17/17 0001      Knee/Hip Exercises: Aerobic   Stationary Bike  no resistance x 6 min      Knee/Hip Exercises: Machines for Strengthening   Cybex Leg Press  20# B LE x 15; 20# B LE heel raise x 15 reps      Knee/Hip Exercises: Standing   Hip Flexion  Right;Left;15 reps;Knee bent    Hip Flexion Limitations  standing on mini tramp    Hip Abduction  Right;Left;15 reps;Knee straight    Abduction Limitations  standing on mini tramp    Hip Extension  Right;Left;15 reps;Knee straight    Extension  Limitations  standing on mini tramp    Other Standing Knee Exercises  standing on AirEx - narrow BOS  - beach ball tosses; B tandem stance with beach ball toss x 3 min each      Ankle Exercises: Standing   Heel Raises  15 reps    Heel Raises Limitations  B UE support - negative    Toe Raise  15 reps    Toe Raise Limitations  B UE support               PT Short Term Goals - 02/03/17 1126      PT SHORT TERM GOAL #1   Title  patient to be independent with initial HEP    Status  Achieved        PT Long Term Goals - 02/28/17 1119      PT LONG TERM GOAL #1   Title  Patient to be independent with advanced HEP    Status  On-going      PT LONG TERM GOAL #2   Title  patient to improve L ankle/foot/LE strength to >/= 4+/5 without pain    Status  Partially Met      PT LONG TERM GOAL #3   Title  patient to demonstrate good heel to gait mechanics over various surfaces with no evidence of LOB    Status  On-going      PT LONG TERM GOAL #4   Title  patient to report ability to ambulate/stand for >/=1 hour in order to allow for return to work    Status  On-going ~ 5 min walking/standing without needing sitting rest break due to R knee pain       PT LONG TERM GOAL #5   Title   patient to demonstrate L SL stance for >/= 10 seconds    Status  Achieved            Plan - 03/17/17 1122    Clinical Impression Statement  Elizabeth [name] has progressed very well with PT s/p L ankle arthrodesis. Patient, however, limited at this time due to R knee pain of which she is to meet with ortho on 12/10. PT sessions now tending to focus on more balance activities as well as working on compliant surfaces with good tolerance. Patient to follow-up with MD today, PT to await advice from MD regarding continued PT vs transition to HEP.     PT Treatment/Interventions  ADLs/Self Care Home Management;Cryotherapy;Electrical Stimulation;Iontophoresis 59m/ml Dexamethasone;Moist Heat;Ultrasound;Neuromuscular re-education;Balance training;Therapeutic exercise;Therapeutic activities;Functional mobility training;Stair training;Gait training;Patient/family education;Manual techniques;Passive range of motion;Vasopneumatic Device;Taping;Dry needling;Scar mobilization    Consulted and Agree with Plan of Care  Patient       Patient will benefit from skilled therapeutic intervention in order to improve the following deficits and impairments:  Abnormal gait, Decreased activity tolerance, Decreased balance, Decreased range of motion, Decreased mobility, Decreased strength, Difficulty walking, Pain  Visit Diagnosis: Pain in left ankle and joints of left foot  Stiffness of left ankle, not elsewhere classified  Difficulty in walking, not elsewhere classified  Other abnormalities of gait and mobility  Muscle weakness (generalized)     G-Codes    Functional Assessment Tool Used (Outpatient Only) Clinical judgement   Functional Limitation Changing and maintaining body position   Changing and Maintaining Body Position Goal Status ((D1761 At least 40 percent but less than 60 percent impaired, limited or restricted   Changing and Maintaining Body Position Discharge Status ((Y0737 At least 40 percent but  less than 60 percent impaired, limited or restricted     Problem List Patient Active Problem List   Diagnosis Date Noted  . Idiopathic chronic venous hypertension of both lower extremities with inflammation 06/29/2016  . Subtalar joint instability, left 06/29/2016  . Arthritis of right knee 01/30/2016  . Lower extremity edema 10/21/2015  . Leg cramps 08/21/2015  . Left ankle pain 04/09/2015  . Anxiety 01/20/2015  . Aortic stenosis, mild 01/08/2015  . Morbid obesity ([place]) 01/08/2015  . Varicose veins 01/02/2015  . Snoring 12/06/2014  . Viral gastroenteritis 04/06/2013  . Insect bite of ankle, right, infected 07/31/2012  . Primary cancer of right upper lobe of lung ([place]) 10/18/2011  . Hip pain 06/29/2011  . Pain in joint, lower leg 03/17/2009  . GASTROENTERITIS 07/11/2008  . PULMONARY NODULE, SOLITARY 04/18/2008  . LIVER FUNCTION TESTS, ABNORMAL, HX OF 04/01/2008  . B12 DEFICIENCY 11/13/2007  . COUGH 11/13/2007  . UNSPECIFIED VITAMIN D DEFICIENCY 11/07/2007  . TINEA PEDIS 07/07/2007  . LYMPHADENOPATHY 11/16/2006  . Hyperlipidemia 09/15/2006  . Essential hypertension 09/15/2006  . LOW BACK PAIN 09/15/2006     Lanney Gins, PT, DPT 03/17/17 12:50 PM   PHYSICAL THERAPY DISCHARGE SUMMARY  Visits from Start of Care: 18  Current functional level related to goals / functional outcomes: See above   Remaining deficits: See above; primary limitations now R knee pain. Progressed very well with all L ankle strengthening, ROM, and balance activities   Education / Equipment: HEP  Plan: Patient agrees to discharge.  Patient goals were partially met. Patient is being discharged due to being pleased with the current functional level.  ?????    Patient d/c from PT with good progression in R ankle functional mobility. Patient now limited by R knee pain, of which she is following up with ortho MD. Will gladly see patient in the future for any other needs.  Lanney Gins,  PT, DPT 03/28/17 9:07 AM   [facility] [address]  [place], [place], [address] Phone: [phone]   Fax:  [phone]  Name: Elizabeth [name] MRN: [id number] Date of Birth: Aug 01, 1950

## 2017-03-21 ENCOUNTER — Encounter: Payer: Self-pay | Admitting: Physical Therapy

## 2017-03-22 ENCOUNTER — Ambulatory Visit: Payer: Medicare Other | Admitting: Physical Therapy

## 2017-03-28 ENCOUNTER — Ambulatory Visit: Payer: Medicare Other

## 2017-04-04 ENCOUNTER — Encounter: Payer: Self-pay | Admitting: Family Medicine

## 2017-04-07 ENCOUNTER — Other Ambulatory Visit: Payer: Self-pay | Admitting: Family Medicine

## 2017-04-07 DIAGNOSIS — M25552 Pain in left hip: Secondary | ICD-10-CM

## 2017-04-12 ENCOUNTER — Other Ambulatory Visit: Payer: Self-pay

## 2017-04-15 DIAGNOSIS — I8311 Varicose veins of right lower extremity with inflammation: Secondary | ICD-10-CM | POA: Diagnosis not present

## 2017-04-15 DIAGNOSIS — I8312 Varicose veins of left lower extremity with inflammation: Secondary | ICD-10-CM | POA: Diagnosis not present

## 2017-04-15 DIAGNOSIS — I872 Venous insufficiency (chronic) (peripheral): Secondary | ICD-10-CM | POA: Diagnosis not present

## 2017-04-15 DIAGNOSIS — L814 Other melanin hyperpigmentation: Secondary | ICD-10-CM | POA: Diagnosis not present

## 2017-04-15 DIAGNOSIS — L821 Other seborrheic keratosis: Secondary | ICD-10-CM | POA: Diagnosis not present

## 2017-04-25 ENCOUNTER — Ambulatory Visit
Admission: RE | Admit: 2017-04-25 | Discharge: 2017-04-25 | Disposition: A | Discharge status: Home / Self Care | Payer: Medicare Other | Source: Ambulatory Visit | Attending: Family Medicine | Admitting: Family Medicine

## 2017-04-25 DIAGNOSIS — M25552 Pain in left hip: Secondary | ICD-10-CM

## 2017-04-25 DIAGNOSIS — M1612 Unilateral primary osteoarthritis, left hip: Secondary | ICD-10-CM | POA: Diagnosis not present

## 2017-04-25 MED ORDER — IOPAMIDOL (ISOVUE-M 200) INJECTION 41%
1.0000 mL | Freq: Once | INTRAMUSCULAR | Status: AC
  Administered 2017-04-25: 1 mL via INTRA_ARTICULAR

## 2017-04-25 MED ORDER — METHYLPREDNISOLONE ACETATE 40 MG/ML INJ SUSP (RADIOLOG
120.0000 mg | Freq: Once | INTRAMUSCULAR | Status: AC
  Administered 2017-04-25: 120 mg via INTRA_ARTICULAR

## 2017-05-16 DIAGNOSIS — M25561 Pain in right knee: Secondary | ICD-10-CM | POA: Diagnosis not present

## 2017-05-16 DIAGNOSIS — Z6841 Body Mass Index (BMI) 40.0 and over, adult: Secondary | ICD-10-CM | POA: Diagnosis not present

## 2017-05-16 DIAGNOSIS — M1711 Unilateral primary osteoarthritis, right knee: Secondary | ICD-10-CM | POA: Diagnosis not present

## 2017-05-16 DIAGNOSIS — G8929 Other chronic pain: Secondary | ICD-10-CM | POA: Diagnosis not present

## 2017-06-06 ENCOUNTER — Encounter: Payer: Self-pay | Admitting: Family Medicine

## 2017-06-06 ENCOUNTER — Ambulatory Visit (INDEPENDENT_AMBULATORY_CARE_PROVIDER_SITE_OTHER): Payer: Medicare Other | Admitting: Family Medicine

## 2017-06-06 DIAGNOSIS — M1711 Unilateral primary osteoarthritis, right knee: Secondary | ICD-10-CM

## 2017-06-07 ENCOUNTER — Encounter: Payer: Self-pay | Admitting: Family Medicine

## 2017-06-07 NOTE — Progress Notes (Signed)
PCP and consultation requested by: Ann Held, DO  Subjective:   HPI: Patient is a 68 y.o. female here for bilateral knee pain.  12/5: Patient does not recall an injury. She reports having almost 6 months of lateral anterior left ankle pain. Pain level 4/10, constant and sharp. Worse when on feet a lot. No swelling or bruising. No skin changes, fever, other complaints.  06/03/15: Patient reports pain worsened since last visit. Pain level 8/10, sharp lateral ankle and anteriorly. Swelling comes and goes. Worse by end of day. No skin cahnges, fever, other complaints.  4/12: Patient reports she did well with injection for left ankle but pain started coming back past 1-2 weeks. Pain is 2/10, sharp anterior left ankle. Using arch supports. Some slight swelling. Worse with ambulation. 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 No locking, giving out. No skin changes, numbness.  6/14: Patient reports over past few weeks pain has returned in left ankle and right knee. Pain level 3/10 in knee, 5/10 in ankle. Pain in knee is anteromedial, worse with walking and can be sharp. Pain in ankle is still lateral, sharp. Also worse with ambulation. No catching, locking of knee or ankle. Will feel like left heel is going to give out. No skin changes, numbness.  8/24: Patient returns for injections for left ankle and right knee. Pain level 9/10 in ankle, 7/10 in right knee.  9/13: Patient reports only transient improvement from left ankle and right knee injections this time. Pain in both is 4/10. Also with left hip pain now also. Pain in hip is 9/10, sharp. Has to sit in car for 10-15 minutes before she gets out. No numbness or tingling. Pain in hip is anterior, lateral. No skin changes.  9/28: Patient returns to start supartz series for right knee. Pain 5/10. Intraarticular hip injection has helped her.  10/4: Patient reports she feels  about the same. Pain is 6/10 today.  10/12: Patient returns with 3/10 level of pain.  11/27: Patient reports she continues to have problems with left ankle, right knee, left hip. Left hip injection helped until past 1-2 weeks and started hurting again. Pain in right knee is 7/10 level, sharp. Worse with stairs, bending the knee. Pain in left ankle anterolateral. Radiates across top of foot and to knee. Pain 9/10 and sharp. No skin changes, numbness.  12/11: Patient returns for 4th supartz injection right knee. Cortisone injection given for left hip earlier today. Pain 4/10 level.  12/18: Patient returns for 5th supartz injection. Pain level 3/10.  06/23/16: Patient reports she is really struggling mainly with left ankle. Pain here is 9/10, difficult to bear any weight, sharp, anterior. Right knee bothering her also - s/p cortisone and supartz series x 5 shots. Knee pain is 6/10, anterior, also sharp. Taking tylenol. Using shoes with good arch support. No skin changes, numbness.  7/31: Patient has had left subtalar arthrodesis 5/25 and is doing well. She is wearing cam walker. Here today as right knee has flared up again at 10/10 level sharp and anterior with walking especially. Associated swelling. Would like an injection. Has seen Dr. Maureen Ralphs and is losing weight with preparation to have joint replacement either at end of this year or early next year. No skin changes, numbness.  06/06/17: Patient returns for her bilateral knees primarily. Pain level 7/10 right knee anteriorly.  Also with some pain of left knee anteriorly. Both worse with walking. No skin changes. At  last visit in July she was working on losing enough weight in preparation for knee replacement at the end of 2018 or beginning of 2019. Her short term disability was extended with this expectation. She set up a second opinion with another orthopedic on 05/16/17 who also stated she needed to lose weight (goal  233 pounds) before they would consider replacement. She has only lost 9 pounds in last 6 months.  Past Medical History:  Diagnosis Date  . Aortic stenosis, mild 01/08/2015  . Cancer ([place]) lung    lung  . Hx of cancer of lung 6/10   Removed June 2010  . Hyperlipidemia   . Hypertension   . Low back pain   . Morbid obesity ([place]) 01/08/2015  . Vitamin D deficiency     Current Outpatient Medications on File Prior to Visit  Medication Sig Dispense Refill  . Acetaminophen (TYLENOL 8 HOUR PO) Take by mouth daily. TAKE THREE TABLETS WITH BID EACH MEAL.    Marland Kitchen aspirin 81 MG tablet Take 81 mg by mouth 2 (two) times daily.     . Cholecalciferol 4000 UNITS CAPS Take 4,000 capsules by mouth daily.    . clonazePAM (KLONOPIN) 0.5 MG tablet 1/2 po qd for 2-3 weeks then stop 30 tablet 0  . Homeopathic Products ([employer] LEG CRAMPS PAIN RELIEF) TABS Take by mouth as needed. [employer] Brand    . olmesartan-hydrochlorothiazide (BENICAR HCT) 40-12.5 MG tablet Take 1 tablet by mouth daily. 90 tablet 3  . Vitamin D, Ergocalciferol, (DRISDOL) 50000 units CAPS capsule TAKE 1 CAPSULE BY MOUTH EVERY 7 DAYS 24 capsule 0   No current facility-administered medications on file prior to visit.     Past Surgical History:  Procedure Laterality Date  . ANKLE FUSION Left   . CHOLECYSTECTOMY    . LUNG REMOVAL, PARTIAL    . TUBAL LIGATION      Allergies  Allergen Reactions  . Penicillins Other (See Comments)    Fever     Social History   Socioeconomic History  . Marital status: Divorced    Spouse name: Not on file  . Number of children: 1  . Years of education: Not on file  . Highest education level: Not on file  Social Needs  . Financial resource strain: Not on file  . Food insecurity - worry: Not on file  . Food insecurity - inability: Not on file  . Transportation needs - medical: Not on file  . Transportation needs - non-medical: Not on file  Occupational History  . Occupation: Sport and exercise psychologist: [employer]  Tobacco Use  . Smoking status: Former Smoker    Types: Cigarettes    Last attempt to quit: 02/01/2005    Years since quitting: 12.3  . Smokeless tobacco: Never Used  Substance and Sexual Activity  . Alcohol use: No    Alcohol/week: 0.0 oz  . Drug use: No  . Sexual activity: Yes  Other Topics Concern  . Not on file  Social History Narrative   Exercise--  no    Family History  Problem Relation Age of Onset  . Hypertension Mother   . Stroke Mother   . Cancer Mother        lung  . Heart disease Father        cabg  . Cancer Brother        skin    BP 116/80   Pulse 79   Ht 5\' 4"  (1.626 m)  Wt 266 lb (120.7 kg)   BMI 45.66 kg/m   Review of Systems: See HPI above.    Objective:  Physical Exam:  Gen: NAD, comfortable in exam room.  Right knee: No gross deformity, ecchymoses, swelling. No TTP. FROM. Negative ant/post drawers. Negative valgus/varus testing. Negative lachmanns. NV intact distally.   Left knee: No gross deformity, ecchymoses, swelling. No TTP. FROM. Negative ant/post drawers. Negative valgus/varus testing. Negative lachmanns. NV intact distally.  Assessment & Plan:  1. 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  Having some compensatory pain in left knee.  We had a long discussion today about her surgery and her short term disability.  Disability was extended through February 2019 (one calendar year); we had extended it from November 2018 to February 2019 to give her a few more months.  She reported she needed additional time to work on weight loss in preparation for surgery as well as her physical therapy from her ankle subtalar fusion that was in May 2018.  She has only lost 9 pounds in past 6 months with her goal weight of 233 (now 266).  I'm concerned she did not seek further help in losing weight (joining a gym, seeing dietitian,  counseling) and did not seek a second opinion with orthopedics until just 2 weeks ago all despite being on short term disability for almost a year.  She has follow-up appointment with orthopedics in 2 months.  Her short term disability can be extended to late April at the latest.  I agreed to extend her disability to this point and told her there will be no further extensions.  Total visit time 25 minutes - > 50% of time spent on counseling, discussing out of work status, disability.

## 2017-06-07 NOTE — Assessment & Plan Note (Signed)
known DJD refractory to conservative treatment including steroid injections and viscosupplementation, exercises.  Having some compensatory pain in left knee.  We had a long discussion today about her surgery and her short term disability.  Disability was extended through February 2019 (one calendar year); we had extended it from November 2018 to February 2019 to give her a few more months.  She reported she needed additional time to work on weight loss in preparation for surgery as well as her physical therapy from her ankle subtalar fusion that was in May 2018.  She has only lost 9 pounds in past 6 months with her goal weight of 233 (now 266).  I'm concerned she did not seek further help in losing weight (joining a gym, seeing dietitian, counseling) and did not seek a second opinion with orthopedics until just 2 weeks ago all despite being on short term disability for almost a year.  She has follow-up appointment with orthopedics in 2 months.  Her short term disability can be extended to late April at the latest.  I agreed to extend her disability to this point and told her there will be no further extensions.  Ortho was setting her up to see weight management - we will also refer her to dietitian.  Total visit time 25 minutes - > 50% of time spent on counseling, discussing out of work status, disability.

## 2017-06-10 ENCOUNTER — Ambulatory Visit (INDEPENDENT_AMBULATORY_CARE_PROVIDER_SITE_OTHER): Payer: Medicare Other | Admitting: Family Medicine

## 2017-06-10 ENCOUNTER — Encounter: Payer: Self-pay | Admitting: Family Medicine

## 2017-06-10 ENCOUNTER — Ambulatory Visit: Payer: Self-pay | Admitting: Family Medicine

## 2017-06-10 DIAGNOSIS — M1711 Unilateral primary osteoarthritis, right knee: Secondary | ICD-10-CM | POA: Diagnosis not present

## 2017-06-10 NOTE — Progress Notes (Signed)
Subjective:  I acted as a Education administrator for [employer]. Elizabeth [name], [place]   Patient ID: Elizabeth [name], female    DOB: [birth year], 67 y.o.   MRN: [id number]  Chief Complaint  Patient presents with  . discuss paperwork    HPI  Patient is in today for paperwork to be out of work for knee pain.  She needs a knee replacement but has to lose more weight before they can do surgery.   She is unable to go back to work due to not being able to stand, walk for prolonged periods.     Patient Care Team: Carollee Herter, Alferd Apa, DO as PCP - Geoffry Paradise, MD as Consulting Physician (Physical Medicine and Rehabilitation) Curt Bears, MD as Consulting Physician (Oncology) Harriett Sine, MD as Consulting Physician (Dermatology) Delila Pereyra, MD as Consulting Physician (Gynecology) Kelton Pillar Junita Push, LCSW as Social Worker (Licensed Clinical Social Worker) Harriett Sine, MD as Consulting Physician (Dermatology)   Past Medical History:  Diagnosis Date  . Aortic stenosis, mild 01/08/2015  . Cancer ([place]) lung    lung  . Hx of cancer of lung 6/10   Removed June 2010  . Hyperlipidemia   . Hypertension   . Low back pain   . Morbid obesity ([place]) 01/08/2015  . Vitamin D deficiency     Past Surgical History:  Procedure Laterality Date  . ANKLE FUSION Left   . CHOLECYSTECTOMY    . LUNG REMOVAL, PARTIAL    . TUBAL LIGATION      Family History  Problem Relation Age of Onset  . Hypertension Mother   . Stroke Mother   . Cancer Mother        lung  . Heart disease Father        cabg  . Cancer Brother        skin    Social History   Socioeconomic History  . Marital status: Divorced    Spouse name: Not on file  . Number of children: 1  . Years of education: Not on file  . Highest education level: Not on file  Social Needs  . Financial resource strain: Not on file  . Food insecurity - worry: Not on file  . Food insecurity - inability: Not on file  . Transportation needs - medical:  Not on file  . Transportation needs - non-medical: Not on file  Occupational History  . Occupation: Software engineer: [employer]  Tobacco Use  . Smoking status: Former Smoker    Types: Cigarettes    Last attempt to quit: 02/01/2005    Years since quitting: 12.3  . Smokeless tobacco: Never Used  Substance and Sexual Activity  . Alcohol use: No    Alcohol/week: 0.0 oz  . Drug use: No  . Sexual activity: Yes  Other Topics Concern  . Not on file  Social History Narrative   Exercise--  no    Outpatient Medications Prior to Visit  Medication Sig Dispense Refill  . Acetaminophen (TYLENOL 8 HOUR PO) Take by mouth daily. TAKE THREE TABLETS WITH BID EACH MEAL.    Marland Kitchen aspirin 81 MG tablet Take 81 mg by mouth 2 (two) times daily.     . Cholecalciferol 4000 UNITS CAPS Take 4,000 capsules by mouth daily.    . Homeopathic Products ([employer]S LEG CRAMPS PAIN RELIEF) TABS Take by mouth as needed. Hylands Brand    . olmesartan-hydrochlorothiazide (BENICAR HCT) 40-12.5 MG tablet Take 1 tablet by  mouth daily. 90 tablet 3  . clonazePAM (KLONOPIN) 0.5 MG tablet 1/2 po qd for 2-3 weeks then stop (Patient not taking: Reported on 06/10/2017) 30 tablet 0  . Vitamin D, Ergocalciferol, (DRISDOL) 50000 units CAPS capsule TAKE 1 CAPSULE BY MOUTH EVERY 7 DAYS (Patient not taking: Reported on 06/10/2017) 24 capsule 0   No facility-administered medications prior to visit.     Allergies  Allergen Reactions  . Penicillins Other (See Comments)    Fever     Review of Systems  Constitutional: Negative for chills, fever and malaise/fatigue.  HENT: Negative for congestion and hearing loss.   Eyes: Negative for discharge.  Respiratory: Negative for cough, sputum production and shortness of breath.   Cardiovascular: Negative for chest pain, palpitations and leg swelling.  Gastrointestinal: Negative for abdominal pain, blood in stool, constipation, diarrhea, heartburn, nausea and vomiting.    Genitourinary: Negative for dysuria, frequency, hematuria and urgency.  Musculoskeletal: Positive for joint pain. Negative for back pain, falls and myalgias.  Skin: Negative for rash.  Neurological: Negative for dizziness, sensory change, loss of consciousness, weakness and headaches.  Endo/Heme/Allergies: Negative for environmental allergies. Does not bruise/bleed easily.  Psychiatric/Behavioral: Negative for depression and suicidal ideas. The patient is not nervous/anxious and does not have insomnia.        Objective:    Physical Exam  Constitutional: She is oriented to person, place, and time. She appears well-developed and well-nourished.  HENT:  Head: Normocephalic and atraumatic.  Eyes: Conjunctivae and EOM are normal.  Neck: Normal range of motion. Neck supple. No JVD present. Carotid bruit is not present. No thyromegaly present.  Cardiovascular: Normal rate, regular rhythm and normal heart sounds.  No murmur heard. Pulmonary/Chest: Effort normal and breath sounds normal. No respiratory distress. She has no wheezes. She has no rales. She exhibits no tenderness.  Musculoskeletal: She exhibits tenderness. She exhibits no edema.       Right knee: She exhibits decreased range of motion. Tenderness found.  Neurological: She is alert and oriented to person, place, and time.  Psychiatric: She has a normal mood and affect.  Nursing note and vitals reviewed.   BP 118/68 (BP Location: Left Arm, Patient Position: Sitting, Cuff Size: Large)   Pulse 85   Temp 97.7 F (36.5 C) (Oral)   Resp 16   Ht 5' 4.17" (1.63 m)   Wt 264 lb 9.6 oz (120 kg)   SpO2 95%   BMI 45.17 kg/m  Wt Readings from Last 3 Encounters:  06/10/17 264 lb 9.6 oz (120 kg)  06/06/17 266 lb (120.7 kg)  02/14/17 277 lb (125.6 kg)   BP Readings from Last 3 Encounters:  06/10/17 118/68  06/06/17 116/80  02/14/17 118/68     Immunization History  Administered Date(s) Administered  . Influenza Whole 04/06/2007   . Influenza, High Dose Seasonal PF 02/14/2017  . Influenza,inj,Quad PF,6+ Mos 01/20/2015, 03/09/2016  . Influenza-Unspecified 03/08/2012, 03/08/2014  . Pneumococcal Conjugate-13 10/17/2014  . Pneumococcal Polysaccharide-23 12/08/2015  . Tdap 10/11/2011  . Zoster 10/25/2011    Health Maintenance  Topic Date Due  . MAMMOGRAM  01/05/2019  . TETANUS/TDAP  10/10/2021  . COLONOSCOPY  02/10/2025  . INFLUENZA VACCINE  Completed  . DEXA SCAN  Completed  . Hepatitis C Screening  Completed  . PNA vac Low Risk Adult  Completed    Lab Results  Component Value Date   WBC 8.3 01/05/2017   HGB 13.0 01/05/2017   HCT 40.3 01/05/2017   PLT 202  01/05/2017   GLUCOSE 99 01/05/2017   CHOL 164 01/05/2017   TRIG 106 01/05/2017   HDL 44 01/05/2017   LDLCALC 99 01/05/2017   ALT 13 01/05/2017   AST 20 01/05/2017   NA 143 01/05/2017   K 4.4 01/05/2017   CL 103 12/08/2015   CREATININE 0.9 01/05/2017   BUN 10.2 01/05/2017   CO2 31 (H) 01/05/2017   TSH 2.990 01/05/2017   INR 1.0 10/16/2008   HGBA1C 5.5 01/05/2017    Lab Results  Component Value Date   TSH 2.990 01/05/2017   Lab Results  Component Value Date   WBC 8.3 01/05/2017   HGB 13.0 01/05/2017   HCT 40.3 01/05/2017   MCV 93.1 01/05/2017   PLT 202 01/05/2017   Lab Results  Component Value Date   NA 143 01/05/2017   K 4.4 01/05/2017   CHLORIDE 104 01/05/2017   CO2 31 (H) 01/05/2017   GLUCOSE 99 01/05/2017   BUN 10.2 01/05/2017   CREATININE 0.9 01/05/2017   BILITOT 0.67 01/05/2017   ALKPHOS 80 01/05/2017   AST 20 01/05/2017   ALT 13 01/05/2017   PROT 7.6 01/05/2017   ALBUMIN 3.7 01/05/2017   CALCIUM 10.2 01/05/2017   ANIONGAP 8 01/05/2017   EGFR 64 (L) 01/05/2017   GFR 73.07 12/08/2015   Lab Results  Component Value Date   CHOL 164 01/05/2017   Lab Results  Component Value Date   HDL 44 01/05/2017   Lab Results  Component Value Date   LDLCALC 99 01/05/2017   Lab Results  Component Value Date   TRIG 106  01/05/2017   Lab Results  Component Value Date   CHOLHDL 4 12/08/2015   Lab Results  Component Value Date   HGBA1C 5.5 01/05/2017         Assessment & Plan:   Problem List Items Addressed This Visit      Unprioritized   Arthritis of right knee    Disability paperwork filled out  Pt to see ortho in April  Hopefully she will be able to get into healthy weight and wellness --- she has been on the waiting list           I am having Ivin Booty A. Cornia maintain her CVS LEG CRAMPS PAIN RELIEF, Cholecalciferol, aspirin, Acetaminophen (TYLENOL 8 HOUR PO), olmesartan-hydrochlorothiazide, clonazePAM, and Vitamin D (Ergocalciferol).  No orders of the defined types were placed in this encounter.   CMA served as Education administrator during this visit. History, Physical and Plan performed by medical provider. Documentation and orders reviewed and attested to.  Ann Held, DO

## 2017-06-10 NOTE — Patient Instructions (Signed)

## 2017-06-10 NOTE — Assessment & Plan Note (Signed)
Disability paperwork filled out  Pt to see ortho in April  Hopefully she will be able to get into healthy weight and wellness --- she has been on the waiting list

## 2017-06-20 NOTE — Addendum Note (Signed)
Addended by: Sherrie George F on: 06/20/2017 12:26 PM   Modules accepted: Orders

## 2017-06-29 ENCOUNTER — Encounter (INDEPENDENT_AMBULATORY_CARE_PROVIDER_SITE_OTHER): Payer: Self-pay

## 2017-07-08 ENCOUNTER — Encounter (INDEPENDENT_AMBULATORY_CARE_PROVIDER_SITE_OTHER): Payer: Self-pay | Admitting: Family Medicine

## 2017-07-13 ENCOUNTER — Ambulatory Visit (INDEPENDENT_AMBULATORY_CARE_PROVIDER_SITE_OTHER): Payer: BC Managed Care – PPO | Admitting: Family Medicine

## 2017-07-18 ENCOUNTER — Telehealth: Payer: Self-pay | Admitting: Family Medicine

## 2017-07-18 NOTE — Telephone Encounter (Signed)
Copied from [place]. Topic: Quick Communication - See Telephone Encounter >> Jul 18, 2017  2:54 PM Rosalin Hawking wrote: CRM for notification. See Telephone encounter for:  07/18/17.   Pt's family member dropped off letter for provider to see from [place] (2 pages) stating pt is needing a documentation supporting diagnosis. Pt wrote a small note attached to document so if any questions to call pt at [phone]. Document put at front office tray under Providers name.

## 2017-07-19 NOTE — Telephone Encounter (Signed)
Patient's note  Was to inform that she had X-ray of knee done at [facility] in [place], a [place], also ordered by a [place] provider. Patient's request is for our office to locate and receive the results of this imaging; request will be handled as received in line, carrying the standard 5-7 business days for /SLS 03/19

## 2017-07-21 ENCOUNTER — Ambulatory Visit: Payer: Self-pay | Admitting: Family Medicine

## 2017-07-26 NOTE — Telephone Encounter (Signed)
Pt following up on this request.  Pt states she is in jeopardy of losing her job if she does not provide this information soon. Aware of the 5-7 business. Would appreciate your help. Pt would like to know something as soon as you could and thank you!!

## 2017-07-28 NOTE — Telephone Encounter (Signed)
Patient is calling back to check on the status of this. She is upset and states she has not heard anything from anybody and has been calling since 07/18/17. I tried to contact the flow coordinator line and all numbers are ringing busy. Please contact patient asap with an update on this.

## 2017-07-28 NOTE — Telephone Encounter (Signed)
Copied from [place]. Topic: Quick Communication - See Telephone Encounter >> Jul 18, 2017  2:54 PM Rosalin Hawking wrote: CRM for notification. See Telephone encounter for:  07/18/17. >> Jul 22, 2017  1:29 PM Neva Seat wrote: Pt checking on the status of paperwork that was recently dropped of.  Please call pt asap to let her know.  Pt's cell - [phone] -If pt cannot answer please leave a message. >> Jul 25, 2017  8:12 AM Conception Chancy, NT wrote: Patient is calling to see if her paper work is ready for pick up. Please advise. She states it is okay to leave message.   [phone]

## 2017-07-28 NOTE — Telephone Encounter (Signed)
Patient informed paperwork ready for pick-up; pt understood & agreed/SLS 03/28

## 2017-08-04 ENCOUNTER — Encounter: Payer: Self-pay | Admitting: Internal Medicine

## 2017-08-04 ENCOUNTER — Ambulatory Visit (INDEPENDENT_AMBULATORY_CARE_PROVIDER_SITE_OTHER): Payer: Medicare Other | Admitting: Internal Medicine

## 2017-08-04 VITALS — Ht 64.0 in | Wt 254.6 lb

## 2017-08-04 DIAGNOSIS — Z6841 Body Mass Index (BMI) 40.0 and over, adult: Secondary | ICD-10-CM | POA: Diagnosis not present

## 2017-08-04 NOTE — Patient Instructions (Signed)
-   discuss with your PCP about referral to physical therapy to see if they would be able to create an exercise program for you  Goals:  - try to incorporate more vegetables (maybe the non-starchy options) - try to spread out the amount of food that you are curretnly eating into 3 eating episodes that are separated equally - eat within the first hour of getting up - try to switch your sleeping schedule (slowly by maybe backing up sleep time by 15 min each day). Try to get more sleep so that you feel refreshed when you get up. Set an alarm for no later than 9 hours before going to bed. Resist napping unless limited to 39mins max.  - think about how you can add some kind of physical activity into your routine (water exercise ? Or light weight upper body work out ?)  Email Dr. Jenne Campus if you have questions!

## 2017-08-04 NOTE — Progress Notes (Signed)
Ms. Owens Shark is a 67 yo with PMH of obesity and is currently trying to lose weight so that she can have knee replacement surgery for OA. Reports has 5 weeks to lose 20lb for knee replacement surgery.  Has been doing Keto diet since mid Feb. For the past 3 weeks has noticed a plateau in weight loss.   Learning Readiness: Change in progress  Usual eating pattern includes 2 meals and 1 snacks per day. Patient is trying intermittent fasting Frequent foods and beverages include: water with lemon concentrate, unsweetened tea with monk fruit sweetener combined with sugar alcohol. Rotisserie chicken (1/4 dark meat) with cottage cheese and green beans, eggs and bacon, salad with no carb dressing. Sausage.  Usual physical activity includes: none   24-hr recall: (Up at 1PM) B ( 1PM)-  water  Snk ( 5 PM)- side salad with lettuce, tomato, and cheese; western omlete (bacon bits, 2-3 eggs, cheese, peppers, onions) with 3tbsp salsa, unsweet tea (20oz)  L ( 11:30 PM)- 0.5 c peccans, 1 piece choc zero chocolate (70%), two sticks of gum. 9g daily of MCT oil Bedtime: 6AM   Typical day? Yes.    Handouts given during visit include:  After-Visit Summary (AVS) Goals:  - try to incorporate more vegetables (maybe the non-starchy options) - try to spread out the amount of food that you are curretnly eating into 3 eating episodes that are separated equally - eat within the first hour of getting up - try to switch your sleeping schedule (slowly by maybe backing up sleep time by 15 min each day). Try to get more sleep so that you feel refreshed when you get up. Set an alarm for no later than 9 hours before going to bed. Resist napping unless limited to 9mins max.  - think about how you can add some kind of physical activity into your routine (water exercise ? Or light weight upper body work out ?) - discuss with your PCP about referral to physical therapy to see if they would be able to create an exercise program for  you  Email Dr. Jenne Campus with questions.

## 2017-08-07 ENCOUNTER — Encounter: Payer: Self-pay | Admitting: Family Medicine

## 2017-08-08 ENCOUNTER — Other Ambulatory Visit: Payer: Self-pay | Admitting: Family Medicine

## 2017-08-08 DIAGNOSIS — M25552 Pain in left hip: Secondary | ICD-10-CM

## 2017-08-19 ENCOUNTER — Ambulatory Visit
Admission: RE | Admit: 2017-08-19 | Discharge: 2017-08-19 | Disposition: A | Discharge status: Home / Self Care | Payer: Medicare Other | Source: Ambulatory Visit | Attending: Family Medicine | Admitting: Family Medicine

## 2017-08-19 DIAGNOSIS — M25552 Pain in left hip: Secondary | ICD-10-CM

## 2017-08-19 DIAGNOSIS — M1612 Unilateral primary osteoarthritis, left hip: Secondary | ICD-10-CM | POA: Diagnosis not present

## 2017-08-19 MED ORDER — METHYLPREDNISOLONE ACETATE 40 MG/ML INJ SUSP (RADIOLOG
120.0000 mg | Freq: Once | INTRAMUSCULAR | Status: AC
  Administered 2017-08-19: 120 mg via INTRA_ARTICULAR

## 2017-08-19 MED ORDER — IOPAMIDOL (ISOVUE-M 200) INJECTION 41%
1.0000 mL | Freq: Once | INTRAMUSCULAR | Status: AC
  Administered 2017-08-19: 1 mL via INTRA_ARTICULAR

## 2017-09-01 ENCOUNTER — Other Ambulatory Visit: Payer: Self-pay

## 2017-09-01 ENCOUNTER — Encounter (HOSPITAL_BASED_OUTPATIENT_CLINIC_OR_DEPARTMENT_OTHER): Payer: Self-pay | Admitting: *Deleted

## 2017-09-01 DIAGNOSIS — Z87891 Personal history of nicotine dependence: Secondary | ICD-10-CM | POA: Diagnosis not present

## 2017-09-01 DIAGNOSIS — Z7982 Long term (current) use of aspirin: Secondary | ICD-10-CM | POA: Insufficient documentation

## 2017-09-01 DIAGNOSIS — M5412 Radiculopathy, cervical region: Secondary | ICD-10-CM | POA: Insufficient documentation

## 2017-09-01 DIAGNOSIS — I10 Essential (primary) hypertension: Secondary | ICD-10-CM | POA: Diagnosis not present

## 2017-09-01 DIAGNOSIS — M79601 Pain in right arm: Secondary | ICD-10-CM | POA: Diagnosis present

## 2017-09-01 NOTE — ED Triage Notes (Signed)
Right arm pain for 4 days. No injury. Good ROM. Neuro intact. Grips strong.

## 2017-09-02 ENCOUNTER — Emergency Department (HOSPITAL_BASED_OUTPATIENT_CLINIC_OR_DEPARTMENT_OTHER)
Admission: EM | Admit: 2017-09-02 | Discharge: 2017-09-02 | Disposition: A | Discharge status: Home / Self Care | Payer: Medicare Other | Attending: Emergency Medicine | Admitting: Emergency Medicine

## 2017-09-02 DIAGNOSIS — M5412 Radiculopathy, cervical region: Secondary | ICD-10-CM | POA: Diagnosis not present

## 2017-09-02 MED ORDER — HYDROCODONE-ACETAMINOPHEN 5-325 MG PO TABS
1.0000 | ORAL_TABLET | Freq: Once | ORAL | Status: AC
  Administered 2017-09-02: 1 via ORAL
  Filled 2017-09-02: qty 1

## 2017-09-02 MED ORDER — HYDROCODONE-ACETAMINOPHEN 5-325 MG PO TABS: 1.0000 | ORAL_TABLET | Freq: Four times a day (QID) | ORAL | 0 refills | Status: DC | PRN

## 2017-09-02 NOTE — ED Provider Notes (Signed)
[facility] Provider Note: Elizabeth Spurling, MD, FACEP  CSN: [id number] MRN: [id number] ARRIVAL: 09/01/17 at [place]: [id number]   CHIEF COMPLAINT  Arm Pain   HISTORY OF PRESENT ILLNESS  09/02/17 2:35 AM Elizabeth [name] is a 67 y.o. female with a 4-day history of pain down her right arm.  The pain is described as sharp and in about the C7 dermatome.  Pain is been a 9 out of 10 at its worst, 6 out of 10 currently.  Pain is worse with certain movements and positions of the neck.  The pain also radiates down to her right scapula.  She denies trauma.  She has a history of lumbar radiculopathy but not cervical radiculopathy.  Consultation with the [facility] state controlled substances database reveals the patient has received 6 prescriptions for hydrocodone and 1 prescription for oxycodone in the past 2 years.  The most recent prescription was in August of last year..   Past Medical History:  Diagnosis Date  . Aortic stenosis, mild 01/08/2015  . Cancer ([place]) lung    lung  . Hx of cancer of lung 6/10   Removed June 2010  . Hyperlipidemia   . Hypertension   . Low back pain   . Morbid obesity ([place]) 01/08/2015  . Vitamin D deficiency     Past Surgical History:  Procedure Laterality Date  . ANKLE FUSION Left   . CHOLECYSTECTOMY    . LUNG REMOVAL, PARTIAL    . TUBAL LIGATION      Family History  Problem Relation Age of Onset  . Hypertension Mother   . Stroke Mother   . Cancer Mother        lung  . Heart disease Father        cabg  . Cancer Brother        skin    Social History   Tobacco Use  . Smoking status: Former Smoker    Types: Cigarettes    Last attempt to quit: 02/01/2005    Years since quitting: 12.5  . Smokeless tobacco: Never Used  Substance Use Topics  . Alcohol use: No    Alcohol/week: 0.0 oz  . Drug use: No    Prior to Admission medications   Medication Sig Start Date End Date Taking? Authorizing Provider  Acetaminophen (TYLENOL 8 HOUR  PO) Take by mouth daily. TAKE THREE TABLETS WITH BID EACH MEAL.    [provider]  aspirin 81 MG tablet Take 81 mg by mouth daily.     [provider]  Cholecalciferol 5000 units capsule Take 5,000 capsules by mouth daily.     [provider]  Homeopathic Products (CVS LEG CRAMPS PAIN RELIEF) TABS Take by mouth as needed. Hyl[employer] Brand    [provider]  olmesartan-hydrochlorothiazide (BENICAR HCT) 40-12.5 MG tablet Take 1 tablet by mouth daily. 12/14/16   Ann Held, DO    Allergies Penicillins   REVIEW OF SYSTEMS  Negative except as noted here or in the History of Present Illness.   PHYSICAL EXAMINATION  Initial Vital Signs Blood pressure 117/62, pulse 65, temperature 97.7 F (36.5 C), temperature source Oral, resp. rate 20, height 5\' 4"  (1.626 m), weight 108.9 kg (240 lb), SpO2 100 %.  Examination General: Well-developed, well-nourished female in no acute distress; appearance consistent with age of record HENT: normocephalic; atraumatic Eyes: pupils equal, round and reactive to light; extraocular muscles intact Neck: supple; pain reproduced with rotation of the neck  to the left, flexion of the next to the left, and posterior flexion of the neck Heart: regular rate and rhythm Lungs: clear to auscultation bilaterally Abdomen: soft; nondistended; nontender; bowel sounds present Extremities: No deformity; full range of motion; pulses normal Neurologic: Awake, alert and oriented; motor function intact in all extremities and symmetric; sensation intact and symmetric in the upper extremities; no facial droop Skin: Warm and dry Psychiatric: Normal mood and affect   RESULTS  Summary of this visit's results, reviewed by myself:   EKG Interpretation  Date/Time:    Ventricular Rate:    PR Interval:    QRS Duration:   QT Interval:    QTC Calculation:   R Axis:     Text Interpretation:        Laboratory Studies: No results found  for this or any previous visit (from the past 24 hour(s)). Imaging Studies: No results found.  ED COURSE and MDM  Nursing notes and initial vitals signs, including pulse oximetry, reviewed.  Vitals:   09/01/17 2300 09/01/17 2301  BP:  117/62  Pulse:  65  Resp:  20  Temp:  97.7 F (36.5 C)  TempSrc:  Oral  SpO2:  100%  Weight: 108.9 kg (240 lb)   Height: 5\' 4"  (1.626 m)    The pattern of the patient's pain as well as its variability with movement of the neck is consistent with a C7 radiculopathy.  The patient is established with Dr. Nelva Bush and we will refer her back in to him.  PROCEDURES    ED DIAGNOSES     ICD-10-CM   1. Cervical radiculopathy at C7 M54.12        Whittley Carandang, Jenny Reichmann, MD 09/02/17 [phone]

## 2017-09-05 DIAGNOSIS — M5412 Radiculopathy, cervical region: Secondary | ICD-10-CM | POA: Diagnosis not present

## 2017-09-15 DIAGNOSIS — M1711 Unilateral primary osteoarthritis, right knee: Secondary | ICD-10-CM | POA: Diagnosis not present

## 2017-09-20 ENCOUNTER — Telehealth: Payer: Self-pay | Admitting: *Deleted

## 2017-09-20 NOTE — Telephone Encounter (Signed)
   [facility] Pre-operative Risk Assessment    Request for surgical clearance:  1. What type of surgery is being performed? TKA-MEDIAL & LATERAL W/WO PATELLA RESURFACING    2. When is this surgery scheduled? 12/12/17   3. What type of clearance is required (medical clearance vs. Pharmacy clearance to hold med vs. Both)? BOTH  4. Are there any medications that need to be held prior to surgery and how long?ASA   5. Practice name and name of physician performing surgery? [facility]    6. What is your office phone number [phone]     7.   What is your office fax number  [phone]  8.   Anesthesia type (None, local, MAC,  general) ?  CHOICE

## 2017-09-21 NOTE — Telephone Encounter (Signed)
   Primary Lamont, MD (last seen 07/2016)  Chart reviewed as part of pre-operative protocol coverage. Because of Genny Caulder Meints's past medical history and time since last visit, he/she will require a follow-up visit in order to better assess preoperative cardiovascular risk.  Pre-op covering staff: - Please schedule appointment and call patient to inform them. - Please contact requesting surgeon's office via preferred method (i.e, phone, fax) to inform them of need for appointment prior to surgery.  [place], [place]  09/21/2017, 3:44 PM

## 2017-09-21 NOTE — Telephone Encounter (Signed)
Patient has an appointment on 12/06/17 for clearance with Dr. Oval Linsey. Will send message to Dr. Oval Linsey and her nurse to see if patient needs any testing prior to her appointment.

## 2017-09-21 NOTE — Telephone Encounter (Signed)
Spoke with patient and she stated she was feeling fine. Will await Dr Blenda Mounts review

## 2017-09-21 NOTE — Telephone Encounter (Signed)
This will be determined at her appointment.

## 2017-09-22 DIAGNOSIS — M5030 Other cervical disc degeneration, unspecified cervical region: Secondary | ICD-10-CM | POA: Diagnosis not present

## 2017-09-22 NOTE — Telephone Encounter (Signed)
   Primary Ehrenberg, MD  Chart reviewed as part of pre-operative protocol coverage. Pre-op clearance already addressed by colleagues in earlier phone notes. To summarize recommendations:  Patient has office visit with Dr. Oval Linsey in August, she will address preop clearance during that visit.   Will route this bundled recommendation to requesting provider via Epic fax function. Please call with questions.  [place], [place] 09/22/2017, 3:19 PM

## 2017-10-05 DIAGNOSIS — M5030 Other cervical disc degeneration, unspecified cervical region: Secondary | ICD-10-CM | POA: Diagnosis not present

## 2017-10-05 DIAGNOSIS — M5412 Radiculopathy, cervical region: Secondary | ICD-10-CM | POA: Diagnosis not present

## 2017-10-24 DIAGNOSIS — M542 Cervicalgia: Secondary | ICD-10-CM | POA: Diagnosis not present

## 2017-10-31 DIAGNOSIS — M1711 Unilateral primary osteoarthritis, right knee: Secondary | ICD-10-CM | POA: Diagnosis not present

## 2017-11-01 DIAGNOSIS — M5030 Other cervical disc degeneration, unspecified cervical region: Secondary | ICD-10-CM | POA: Diagnosis not present

## 2017-11-02 DIAGNOSIS — M79671 Pain in right foot: Secondary | ICD-10-CM | POA: Diagnosis not present

## 2017-11-02 DIAGNOSIS — M5412 Radiculopathy, cervical region: Secondary | ICD-10-CM | POA: Diagnosis not present

## 2017-11-08 ENCOUNTER — Ambulatory Visit: Payer: Medicare Other | Admitting: Family Medicine

## 2017-11-11 ENCOUNTER — Ambulatory Visit (INDEPENDENT_AMBULATORY_CARE_PROVIDER_SITE_OTHER): Payer: Medicare Other | Admitting: Family Medicine

## 2017-11-11 ENCOUNTER — Encounter: Payer: Self-pay | Admitting: Family Medicine

## 2017-11-11 VITALS — BP 114/56 | HR 73 | Temp 98.0°F | Resp 16 | Ht 64.0 in | Wt 233.0 lb

## 2017-11-11 DIAGNOSIS — I10 Essential (primary) hypertension: Secondary | ICD-10-CM

## 2017-11-11 DIAGNOSIS — E785 Hyperlipidemia, unspecified: Secondary | ICD-10-CM

## 2017-11-11 DIAGNOSIS — Z01818 Encounter for other preprocedural examination: Secondary | ICD-10-CM

## 2017-11-11 LAB — LIPID PANEL
Cholesterol: 141 mg/dL (ref <200)
HDL: 35 mg/dL — ABNORMAL LOW (ref >50)
LDL Cholesterol (Calc): 85 mg/dL (calc)
NON-HDL CHOLESTEROL (CALC): 106 mg/dL (ref <130)
TRIGLYCERIDES: 114 mg/dL (ref <150)
Total CHOL/HDL Ratio: 4 (calc) (ref <5.0)

## 2017-11-11 LAB — COMPREHENSIVE METABOLIC PANEL
AG Ratio: 1.6 (calc) (ref 1.0–2.5)
ALKALINE PHOSPHATASE (APISO): 51 U/L (ref 33–130)
ALT: 11 U/L (ref 6–29)
AST: 16 U/L (ref 10–35)
Albumin: 4.5 g/dL (ref 3.6–5.1)
BUN: 13 mg/dL (ref 7–25)
CALCIUM: 10 mg/dL (ref 8.6–10.4)
CO2: 28 mmol/L (ref 20–32)
Chloride: 101 mmol/L (ref 98–110)
Creat: 0.83 mg/dL (ref 0.50–0.99)
Globulin: 2.9 g/dL (calc) (ref 1.9–3.7)
Glucose, Bld: 88 mg/dL (ref 65–99)
POTASSIUM: 4.7 mmol/L (ref 3.5–5.3)
SODIUM: 141 mmol/L (ref 135–146)
Total Bilirubin: 0.8 mg/dL (ref 0.2–1.2)
Total Protein: 7.4 g/dL (ref 6.1–8.1)

## 2017-11-11 MED ORDER — OLMESARTAN MEDOXOMIL-HCTZ 20-12.5 MG PO TABS: 1.0000 | ORAL_TABLET | Freq: Every day | ORAL | 2 refills | Status: DC

## 2017-11-11 NOTE — Progress Notes (Signed)
Subjective:    Elizabeth [name] is a 67 y.o. female who presents to the office today for a preoperative consultation at the request of surgeon DR Aluisio who plans on performing R total Knee on August 12. This consultation is requested for the specific conditions prompting preoperative evaluation (i.e. because of potential affect on operative risk): hyperlipidemia, htn , aortic stenosis, . Planned anesthesia: general. The patient has the following known anesthesia issues: none. Patients bleeding risk: no recent abnormal bleeding. Patient does have objections to receiving blood products if needed.  The following portions of the patient's history were reviewed and updated as appropriate:  She  has a past medical history of Aortic stenosis, mild (01/08/2015), Cancer ([place]) (lung ), cancer of lung (6/10), Hyperlipidemia, Hypertension, Low back pain, Morbid obesity ([place]) (01/08/2015), and Vitamin D deficiency. She does not have any pertinent problems on file. She  has a past surgical history that includes Cholecystectomy; Tubal ligation; Lung removal, partial; and Ankle Fusion (Left). Her family history includes Cancer in her brother and mother; Heart disease in her father; Hypertension in her mother; Stroke in her mother. She  reports that she quit smoking about 12 years ago. Her smoking use included cigarettes. She has never used smokeless tobacco. She reports that she does not drink alcohol or use drugs. She has a current medication list which includes the following prescription(s): aspirin, cholecalciferol, cvs leg cramps pain relief, hydrocodone-acetaminophen, and olmesartan-hydrochlorothiazide. Current Outpatient Medications on File Prior to Visit  Medication Sig Dispense Refill  . aspirin 81 MG tablet Take 81 mg by mouth daily.     . Cholecalciferol 5000 units capsule Take 5,000 capsules by mouth daily.     . Homeopathic Products ([employer] LEG CRAMPS PAIN RELIEF) TABS Take by mouth as needed. [employer]     . HYDROcodone-acetaminophen (NORCO) 5-325 MG tablet Take 1 tablet by mouth every 6 (six) hours as needed (for pain). 20 tablet 0  . olmesartan-hydrochlorothiazide (BENICAR HCT) 40-12.5 MG tablet Take 1 tablet by mouth daily. 90 tablet 3   No current facility-administered medications on file prior to visit.    She is allergic to penicillins..  Review of Systems Review of Systems  Constitutional: Negative for activity change, appetite change and fatigue.  HENT: Negative for hearing loss, congestion, tinnitus and ear discharge.  dentist q21m Eyes: Negative for visual disturbance (see optho q1y -- vision corrected to 20/20 with glasses).  Respiratory: Negative for cough, chest tightness and shortness of breath.   Cardiovascular: Negative for chest pain, palpitations and leg swelling.  Gastrointestinal: Negative for abdominal pain, diarrhea, constipation and abdominal distention.  Genitourinary: Negative for urgency, frequency, decreased urine volume and difficulty urinating.  Musculoskeletal: Negative for back pain, R knee pain Skin: Negative for color change, pallor and rash.  Neurological: Negative for dizziness, light-headedness, numbness and headaches.  Hematological: Negative for adenopathy. Does not bruise/bleed easily.  Psychiatric/Behavioral: Negative for suicidal ideas, confusion, sleep disturbance, self-injury, dysphoric mood, decreased concentration and agitation.       Objective:    BP (!) 114/56 (BP Location: Right Arm, Cuff Size: Large)   Pulse 73   Temp 98 F (36.7 C) (Oral)   Resp 16   Ht 5\' 4"  (1.626 m)   Wt 233 lb (105.7 kg)   SpO2 96%   BMI 39.99 kg/m  General appearance: alert, cooperative, appears stated age and no distress Head: Normocephalic, without obvious abnormality, atraumatic Eyes: conjunctivae/corneas clear. PERRL, EOM's intact. Fundi benign. Ears: normal TM's and external ear  canals both ears Nose: Nares normal. Septum midline. Mucosa normal. No  drainage or sinus tenderness. Throat: lips, mucosa, and tongue normal; teeth and gums normal Neck: no adenopathy, no carotid bruit, no JVD, supple, symmetrical, trachea midline and thyroid not enlarged, symmetric, no tenderness/mass/nodules Back: symmetric, no curvature. ROM normal. No CVA tenderness. Lungs: clear to auscultation bilaterally Heart: S1, S2 normal and + murmur Abdomen: soft, non-tender; bowel sounds normal; no masses,  no organomegaly Pelvic: deferred Extremities: pain R knee Pulses: 2+ and symmetric Skin: Skin color, texture, turgor normal. No rashes or lesions Lymph nodes: Cervical, supraclavicular, and axillary nodes normal. Neurologic: Alert and oriented X 3, normal strength and tone. Normal symmetric reflexes. Normal coordination and gait   Cardiographics ECG: per cardiology Echocardiogram: per cardiology if needed  Lab Review  No visits with results within 6 Month(s) from this visit.  Latest known visit with results is:  Office Visit on 02/14/2017  Component Date Value  . VITD 02/14/2017 78.87       Assessment:      68 y.o. female with planned surgery as above.   Known risk factors for perioperative complications: history lung cancer --- removed 2010  , aortic stenosis, htn, hyperlipidemia Difficulty with intubation is not anticipated.  Cardiac Risk Estimation: per cardiology     Plan:    1. Preoperative workup as follows see cardiology ,  Lipid and cmp done today. 2. Change in medication regimen before surgery: per surgical team. 3. Deep vein thrombosis prophylaxis postoperatively:regimen to be chosen by surgical team.

## 2017-11-11 NOTE — Patient Instructions (Signed)

## 2017-11-17 ENCOUNTER — Other Ambulatory Visit: Payer: Self-pay | Admitting: Orthopedic Surgery

## 2017-11-17 NOTE — Care Plan (Signed)
Ortho Bundle Scheduled R TKA on 12-12-17 DCP:  Home with grandson.  1 story/2 ste. DME:  No needs.  Has a RW and toilet seat elevator. PT:  [facility].  PT eval scheduled for 12-16-17.

## 2017-11-21 ENCOUNTER — Ambulatory Visit: Payer: Medicare Other | Admitting: Family Medicine

## 2017-11-22 DIAGNOSIS — M5412 Radiculopathy, cervical region: Secondary | ICD-10-CM | POA: Diagnosis not present

## 2017-11-23 ENCOUNTER — Telehealth: Payer: Self-pay | Admitting: *Deleted

## 2017-11-23 ENCOUNTER — Other Ambulatory Visit (INDEPENDENT_AMBULATORY_CARE_PROVIDER_SITE_OTHER): Payer: Medicare Other

## 2017-11-23 ENCOUNTER — Encounter: Payer: Self-pay | Admitting: Physician Assistant

## 2017-11-23 ENCOUNTER — Ambulatory Visit (INDEPENDENT_AMBULATORY_CARE_PROVIDER_SITE_OTHER): Payer: Medicare Other | Admitting: Physician Assistant

## 2017-11-23 VITALS — BP 118/70 | HR 60 | Ht 64.0 in | Wt 234.2 lb

## 2017-11-23 DIAGNOSIS — Z01818 Encounter for other preprocedural examination: Secondary | ICD-10-CM | POA: Diagnosis not present

## 2017-11-23 DIAGNOSIS — I10 Essential (primary) hypertension: Secondary | ICD-10-CM

## 2017-11-23 DIAGNOSIS — Z01812 Encounter for preprocedural laboratory examination: Secondary | ICD-10-CM

## 2017-11-23 DIAGNOSIS — Z01810 Encounter for preprocedural cardiovascular examination: Secondary | ICD-10-CM | POA: Diagnosis not present

## 2017-11-23 DIAGNOSIS — I350 Nonrheumatic aortic (valve) stenosis: Secondary | ICD-10-CM

## 2017-11-23 DIAGNOSIS — R739 Hyperglycemia, unspecified: Secondary | ICD-10-CM | POA: Diagnosis not present

## 2017-11-23 LAB — CBC WITH DIFFERENTIAL/PLATELET
BASOS ABS: 0 10*3/uL (ref 0.0–0.1)
Basophils Relative: 0.2 % (ref 0.0–3.0)
Eosinophils Absolute: 0.2 10*3/uL (ref 0.0–0.7)
Eosinophils Relative: 1 % (ref 0.0–5.0)
HCT: 42 % (ref 36.0–46.0)
Hemoglobin: 13.5 g/dL (ref 12.0–15.0)
LYMPHS PCT: 33.1 % (ref 12.0–46.0)
Lymphs Abs: 5.2 10*3/uL — ABNORMAL HIGH (ref 0.7–4.0)
MCHC: 32.1 g/dL (ref 30.0–36.0)
MCV: 95.1 fl (ref 78.0–100.0)
MONOS PCT: 6.4 % (ref 3.0–12.0)
Monocytes Absolute: 1 10*3/uL (ref 0.1–1.0)
Neutro Abs: 9.4 10*3/uL — ABNORMAL HIGH (ref 1.4–7.7)
Neutrophils Relative %: 59.3 % (ref 43.0–77.0)
Platelets: 224 10*3/uL (ref 150.0–400.0)
RBC: 4.42 Mil/uL (ref 3.87–5.11)
RDW: 14.4 % (ref 11.5–15.5)
WBC: 15.8 10*3/uL — ABNORMAL HIGH (ref 4.0–10.5)

## 2017-11-23 LAB — POC URINALSYSI DIPSTICK (AUTOMATED)
BILIRUBIN UA: NEGATIVE
Blood, UA: NEGATIVE
Glucose, UA: NEGATIVE
Ketones, UA: NEGATIVE
Leukocytes, UA: NEGATIVE
NITRITE UA: NEGATIVE
PH UA: 6 (ref 5.0–8.0)
Protein, UA: NEGATIVE
Spec Grav, UA: 1.025 (ref 1.010–1.025)
Urobilinogen, UA: 0.2 E.U./dL

## 2017-11-23 LAB — HEMOGLOBIN A1C: HEMOGLOBIN A1C: 5.5 % (ref 4.6–6.5)

## 2017-11-23 LAB — ALBUMIN: ALBUMIN: 4.3 g/dL (ref 3.5–5.2)

## 2017-11-23 NOTE — Patient Instructions (Signed)
Medication Instructions: Your physician recommends that you continue on your current medications as directed. Please refer to the Current Medication list given to you today.   Follow-Up: Your physician wants you to follow-up in: 1 year with Dr. Oval Linsey. You will receive a reminder letter in the mail two months in advance. If you don't receive a letter, please call our office to schedule the follow-up appointment.   Suanne Marker will send your clearance information to Dr. Wynelle Link.   If you need a refill on your cardiac medications before your next appointment, please call your pharmacy.

## 2017-11-23 NOTE — Progress Notes (Signed)
Cardiology Office Note   Date:  11/23/2017   ID:  Elizabeth [name], DOB [birth year], MRN [id number]  PCP:  Ann Held, DO  Cardiologist:  Dr Oval Linsey, 07/2016 Rosaria Ferries, PA-C   Chief Complaint  Patient presents with  . Surgical Clearance    Pt states no Sx.    History of Present Illness: Elizabeth [name] is a 66 y.o. female with a history of HTN, HLD, mild AS, lung CA s/p lobectomy, morbid obesity, osteoarthritis, vitamin D deficiency  07/08/2016 office visit, preop for noncardiac surgery, she was able to achieve 4 METS based on the interview and no additional cardiac work-up was indicated.  An echo showed mild AS and normal EF  08/2017 phone notes regarding planned right TKA, cardiac clearance requested and permission to hold aspirin 11/11/2017 PCP visit, labs done and were okay  Elizabeth [name] presents for cardiology follow up and evaluation.  She does not exercise. Is doing a little at the recommendation of the surgeons. Limited by the knee.   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   She can climb stairs without stopping, goes slowly and has to pull up because of the knee.   Does not get chest pain or SOB w/ activity.   Is not very active around the house, but does laundry and some cooking.   No presyncope or syncope.   L ankle sometimes swells during the day, has done this since she had surgery on that side.   She has lost about 30 lbs in the past year. Does Keto part of the time.  Her ketone supplement include some potassium, serum magnesium, and 120 mg of caffeine.  She was having problems w/ cramps, takes OTC magnesium supplements, they resolved.   Past Medical History:  Diagnosis Date  . Aortic stenosis, mild 01/08/2015  . Hx of cancer of lung 6/10   Removed June 2010  . Hyperlipidemia   . Hypertension   . Low back pain   . Morbid obesity ([place]) 01/08/2015  . Vitamin D deficiency     Past Surgical History:    Procedure Laterality Date  . ANKLE FUSION Left   . CHOLECYSTECTOMY    . LUNG REMOVAL, PARTIAL    . TUBAL LIGATION      Current Outpatient Medications  Medication Sig Dispense Refill  . aspirin 81 MG tablet Take 81 mg by mouth daily.     . Cholecalciferol 5000 units capsule Take 5,000 capsules by mouth daily.     . Homeopathic Products ([employer]S LEG CRAMPS PAIN RELIEF) TABS Take by mouth as needed. [employer]    . HYDROcodone-acetaminophen (NORCO) 5-325 MG tablet Take 1 tablet by mouth every 6 (six) hours as needed (for pain). 20 tablet 0  . Melatonin 5 MG CAPS Take by mouth at bedtime as needed.    . NON FORMULARY Take 4 capsules by mouth daily. Exogenous Ketones BHB Supplement--natural caffiene & L-Carnitine    . NON FORMULARY Take 3 capsules by mouth daily. [employer]    . olmesartan-hydrochlorothiazide (BENICAR HCT) 20-12.5 MG tablet Take 1 tablet by mouth daily. 30 tablet 2  . olmesartan-hydrochlorothiazide (BENICAR HCT) 40-12.5 MG tablet Take 1 tablet by mouth daily. 90 tablet 3   No current facility-administered medications for this visit.     Allergies:   Penicillins    Social History:  The patient  reports that she quit smoking about 12 years  ago. Her smoking use included cigarettes. She has never used smokeless tobacco. She reports that she does not drink alcohol or use drugs.   Family History:  The patient's family history includes Cancer in her brother and mother; Heart disease in her father; Hypertension in her mother; Stroke in her mother.    ROS:  Please see the history of present illness. All other systems are reviewed and negative.    PHYSICAL EXAM: VS:  BP 118/70   Pulse 60   Ht 5\' 4"  (1.626 m)   Wt 234 lb 3.2 oz (106.2 kg)   BMI 40.20 kg/m  , BMI Body mass index is 40.2 kg/m. GEN: Well nourished, well developed, female in no acute distress  HEENT: normal for age  Neck: no JVD, no carotid bruit, no masses Cardiac: RRR; 2/6 murmur, no rubs, or  gallops Respiratory:  clear to auscultation bilaterally, normal work of breathing GI: soft, nontender, nondistended, + BS MS: no deformity or atrophy; no edema; distal pulses are 2+ in all 4 extremities   Skin: warm and dry, no rash, chronic stasis changes on lower extremities, L>R Neuro:  Strength and sensation are intact Psych: euthymic mood, full affect   EKG:  EKG is ordered today. The ekg ordered today demonstrates sinus rhythm, heart rate 60, no acute ischemic changes, minimal early re-pole  ECHO: 07/2016 - Left ventricle: The cavity size was normal. Systolic function was   normal. The estimated ejection fraction was in the range of 60%   to 65%. Wall motion was normal; there were no regional wall   motion abnormalities. Left ventricular diastolic function   parameters were normal. - Aortic valve: Moderately calcified annulus. Trileaflet; mildly   thickened, mildly calcified leaflets. There was mild stenosis.   Mean gradient (S): 14 mm Hg. Peak gradient (S): 27 mm Hg. - Mitral valve: There was trivial regurgitation. Impressions: - Normal LVF, normal diastolic function, mild AS with mean AV   gradient 6mmHg and AVA by VTA 1.54cm2.    Recent Labs: 01/05/2017: HGB 13.0; Platelets 202; TSH 2.990 11/11/2017: ALT 11; BUN 13; Creat 0.83; Potassium 4.7; Sodium 141    Lipid Panel    Component Value Date/Time   CHOL 141 11/11/2017 1636   CHOL 164 01/05/2017 1131   TRIG 114 11/11/2017 1636   TRIG 73 04/14/2006 1429   HDL 35 (L) 11/11/2017 1636   HDL 44 01/05/2017 1131   CHOLHDL 4.0 11/11/2017 1636   VLDL 28.2 12/08/2015 1213   LDLCALC 85 11/11/2017 1636     Wt Readings from Last 3 Encounters:  11/23/17 234 lb 3.2 oz (106.2 kg)  11/11/17 233 lb (105.7 kg)  09/01/17 240 lb (108.9 kg)     Other studies Reviewed: Additional studies/ records that were reviewed today include: Office notes and testing.  ASSESSMENT AND PLAN:  1.  Preop cardiovascular exam: She is doing  well from a cardiac standpoint.  Although her activity is limited by her arthritis, she is not having chest pain or shortness of breath with exertion.  I believe she can do 4 METS and that is an acceptable level of activity for this surgery.  She is at acceptable risk for the planned procedure without further cardiac evaluation.  Recommend careful attention to volume status during surgery.  2.  Mild aortic stenosis: She had an echocardiogram a year ago, results above.  Aortic stenosis was mild by gradients and she has had no progression of symptoms.  3.  Hypertension: Her blood pressure  is well controlled on current therapy, no changes.  Goal blood pressure 130/80   Current medicines are reviewed at length with the patient today.  The patient does not have concerns regarding medicines.  The following changes have been made:  no change  Labs/ tests ordered today include:   Orders Placed This Encounter  Procedures  . EKG 12-Lead     Disposition:   FU with Dr Oval Linsey  Signed, Rosaria Ferries, PA-C  11/23/2017 9:30 AM    [place] Phone: [phone]; Fax: [phone]  This note was written with the assistance of speech recognition software. Please excuse any transcriptional errors.   [facility] DR Gaynelle Arabian    office phone number [phone]     office fax number [phone]

## 2017-11-23 NOTE — Telephone Encounter (Signed)
   Primary Cardiologist: Skeet Latch, MD  Chart reviewed and patient seen today as part of pre-operative protocol coverage.   Ms. Owens Shark is doing well from a cardiac standpoint.   Based on ACC/AHA guidelines, VENDELA TROUNG would be at acceptable risk for the planned procedure without further cardiovascular testing.   I will route this recommendation to the requesting party via Epic fax function and remove from pre-op pool.  Please call with questions.  Rosaria Ferries, PA-C 11/23/2017, 12:19 PM    [facility] DR Gaynelle Arabian    office phone number [phone]     office fax number [phone]

## 2017-11-23 NOTE — Telephone Encounter (Signed)
Called patient to schedule lab only appointment needed to get more labs for surgical clearance.  Patient will be in today.

## 2017-11-24 ENCOUNTER — Encounter: Payer: Self-pay | Admitting: *Deleted

## 2017-11-28 NOTE — Addendum Note (Signed)
Addended byDamita Dunnings D on: 11/28/2017 10:11 AM   Modules accepted: Orders

## 2017-12-05 NOTE — Progress Notes (Signed)
Cardiac clearance Ronda Barrett PA-C on chart   LOV CARDS 7-24--19 Epic   EKG 11-23-17 Epic   CBCDIFF, HGBA1C 11-03-17; CMP 7-12--19 Epic   CT CHEST  9 -5-18 Epic   ECHO 12-09-16 Epic

## 2017-12-05 NOTE — Patient Instructions (Signed)
Elizabeth [name]  12/05/2017   Your procedure is scheduled on: 12-12-17   Report to [facility] Main  Entrance    Report to admitting at 6:00AM    Call this number if you have problems the morning of surgery [phone]     Remember: Do not eat food or drink liquids :After Midnight.     Take these medicines the morning of surgery with A SIP OF WATER: OXYCODONE IF NEEDED                                You may not have any metal on your body including hair pins and              piercings  Do not wear jewelry, make-up, lotions, powders or perfumes, deodorant             Do not wear nail polish.  Do not shave  48 hours prior to surgery.     Do not bring valuables to the hospital. [place].  Contacts, dentures or bridgework may not be worn into surgery.  Leave suitcase in the car. After surgery it may be brought to your room.                   Please read over the following fact sheets you were given: _____________________________________________________________________             [facility] - Preparing for Surgery Before surgery, you can play an important role.  Because skin is not sterile, your skin needs to be as free of germs as possible.  You can reduce the number of germs on your skin by washing with CHG (chlorahexidine gluconate) soap before surgery.  CHG is an antiseptic cleaner which kills germs and bonds with the skin to continue killing germs even after washing. Please DO NOT use if you have an allergy to CHG or antibacterial soaps.  If your skin becomes reddened/irritated stop using the CHG and inform your nurse when you arrive at Short Stay. Do not shave (including legs and underarms) for at least 48 hours prior to the first CHG shower.  You may shave your face/neck. Please follow these instructions carefully:  1.  Shower with CHG Soap the night before surgery and the  morning of Surgery.  2.   If you choose to wash your hair, wash your hair first as usual with your  normal  shampoo.  3.  After you shampoo, rinse your hair and body thoroughly to remove the  shampoo.                           4.  Use CHG as you would any other liquid soap.  You can apply chg directly  to the skin and wash                       Gently with a scrungie or clean washcloth.  5.  Apply the CHG Soap to your body ONLY FROM THE NECK DOWN.   Do not use on face/ open  Wound or open sores. Avoid contact with eyes, ears mouth and genitals (private parts).                       Wash face,  Genitals (private parts) with your normal soap.             6.  Wash thoroughly, paying special attention to the area where your surgery  will be performed.  7.  Thoroughly rinse your body with warm water from the neck down.  8.  DO NOT shower/wash with your normal soap after using and rinsing off  the CHG Soap.                9.  Pat yourself dry with a clean towel.            10.  Wear clean pajamas.            11.  Place clean sheets on your bed the night of your first shower and do not  sleep with pets. Day of Surgery : Do not apply any lotions/deodorants the morning of surgery.  Please wear clean clothes to the hospital/surgery center.  FAILURE TO FOLLOW THESE INSTRUCTIONS MAY RESULT IN THE CANCELLATION OF YOUR SURGERY PATIENT SIGNATURE_________________________________  NURSE SIGNATURE__________________________________  ________________________________________________________________________   Elizabeth [name]  An incentive spirometer is a tool that can help keep your lungs clear and active. This tool measures how well you are filling your lungs with each breath. Taking long deep breaths may help reverse or decrease the chance of developing breathing (pulmonary) problems (especially infection) following:  A long period of time when you are unable to move or be active. BEFORE THE PROCEDURE    If the spirometer includes an indicator to show your best effort, your nurse or respiratory therapist will set it to a desired goal.  If possible, sit up straight or lean slightly forward. Try not to slouch.  Hold the incentive spirometer in an upright position. INSTRUCTIONS FOR USE  1. Sit on the edge of your bed if possible, or sit up as far as you can in bed or on a chair. 2. Hold the incentive spirometer in an upright position. 3. Breathe out normally. 4. Place the mouthpiece in your mouth and seal your lips tightly around it. 5. Breathe in slowly and as deeply as possible, raising the piston or the ball toward the top of the column. 6. Hold your breath for 3-5 seconds or for as long as possible. Allow the piston or ball to fall to the bottom of the column. 7. Remove the mouthpiece from your mouth and breathe out normally. 8. Rest for a few seconds and repeat Steps 1 through 7 at least 10 times every 1-2 hours when you are awake. Take your time and take a few normal breaths between deep breaths. 9. The spirometer may include an indicator to show your best effort. Use the indicator as a goal to work toward during each repetition. 10. After each set of 10 deep breaths, practice coughing to be sure your lungs are clear. If you have an incision (the cut made at the time of surgery), support your incision when coughing by placing a pillow or rolled up towels firmly against it. Once you are able to get out of bed, walk around indoors and cough well. You may stop using the incentive spirometer when instructed by your caregiver.  RISKS AND COMPLICATIONS  Take your time so you do not get  dizzy or light-headed.  If you are in pain, you may need to take or ask for pain medication before doing incentive spirometry. It is harder to take a deep breath if you are having pain. AFTER USE  Rest and breathe slowly and easily.  It can be helpful to keep track of a log of your progress. Your caregiver  can provide you with a simple table to help with this. If you are using the spirometer at home, follow these instructions: [place] IF:   You are having difficultly using the spirometer.  You have trouble using the spirometer as often as instructed.  Your pain medication is not giving enough relief while using the spirometer.  You develop fever of 100.5 F (38.1 C) or higher. SEEK IMMEDIATE MEDICAL CARE IF:   You cough up bloody sputum that had not been present before.  You develop fever of 102 F (38.9 C) or greater.  You develop worsening pain at or near the incision site. MAKE SURE YOU:   Understand these instructions.  Will watch your condition.  Will get help right away if you are not doing well or get worse. Document Released: 08/30/2006 Document Revised: 07/12/2011 Document Reviewed: 10/31/2006 [employer] Patient Information 2014 [employer], [place].   ________________________________________________________________________  WHAT IS A BLOOD TRANSFUSION? Blood Transfusion Information  A transfusion is the replacement of blood or some of its parts. Blood is made up of multiple cells which provide different functions.  Red blood cells carry oxygen and are used for blood loss replacement.  White blood cells fight against infection.  Platelets control bleeding.  Plasma helps clot blood.  Other blood products are available for specialized needs, such as hemophilia or other clotting disorders. BEFORE THE TRANSFUSION  Who gives blood for transfusions?   Healthy volunteers who are fully evaluated to make sure their blood is safe. This is blood bank blood. Transfusion therapy is the safest it has ever been in the practice of medicine. Before blood is taken from a donor, a complete history is taken to make sure that person has no history of diseases nor engages in risky social behavior (examples are intravenous drug use or sexual activity with multiple partners). The  donor's travel history is screened to minimize risk of transmitting infections, such as malaria. The donated blood is tested for signs of infectious diseases, such as HIV and hepatitis. The blood is then tested to be sure it is compatible with you in order to minimize the chance of a transfusion reaction. If you or a relative donates blood, this is often done in anticipation of surgery and is not appropriate for emergency situations. It takes many days to process the donated blood. RISKS AND COMPLICATIONS Although transfusion therapy is very safe and saves many lives, the main dangers of transfusion include:   Getting an infectious disease.  Developing a transfusion reaction. This is an allergic reaction to something in the blood you were given. Every precaution is taken to prevent this. The decision to have a blood transfusion has been considered carefully by your caregiver before blood is given. Blood is not given unless the benefits outweigh the risks. AFTER THE TRANSFUSION  Right after receiving a blood transfusion, you will usually feel much better and more energetic. This is especially true if your red blood cells have gotten low (anemic). The transfusion raises the level of the red blood cells which carry oxygen, and this usually causes an energy increase.  The nurse administering the transfusion will  monitor you carefully for complications. HOME CARE INSTRUCTIONS  No special instructions are needed after a transfusion. You may find your energy is better. Speak with your caregiver about any limitations on activity for underlying diseases you may have. SEEK MEDICAL CARE IF:   Your condition is not improving after your transfusion.  You develop redness or irritation at the intravenous (IV) site. SEEK IMMEDIATE MEDICAL CARE IF:  Any of the following symptoms occur over the next 12 hours:  Shaking chills.  You have a temperature by mouth above 102 F (38.9 C), not controlled by  medicine.  Chest, back, or muscle pain.  People around you feel you are not acting correctly or are confused.  Shortness of breath or difficulty breathing.  Dizziness and fainting.  You get a rash or develop hives.  You have a decrease in urine output.  Your urine turns a dark color or changes to pink, red, or brown. Any of the following symptoms occur over the next 10 days:  You have a temperature by mouth above 102 F (38.9 C), not controlled by medicine.  Shortness of breath.  Weakness after normal activity.  The white part of the eye turns yellow (jaundice).  You have a decrease in the amount of urine or are urinating less often.  Your urine turns a dark color or changes to pink, red, or brown. Document Released: 04/16/2000 Document Revised: 07/12/2011 Document Reviewed: 12/04/2007 [facility] Patient Information 2014 [place], [place].  _______________________________________________________________________

## 2017-12-06 ENCOUNTER — Ambulatory Visit: Payer: Self-pay | Admitting: Cardiovascular Disease

## 2017-12-06 ENCOUNTER — Encounter (HOSPITAL_COMMUNITY): Payer: Self-pay

## 2017-12-06 ENCOUNTER — Encounter (HOSPITAL_COMMUNITY)
Admission: RE | Admit: 2017-12-06 | Discharge: 2017-12-06 | Disposition: A | Discharge status: Home / Self Care | Payer: Medicare Other | Source: Ambulatory Visit | Attending: Orthopedic Surgery | Admitting: Orthopedic Surgery

## 2017-12-06 DIAGNOSIS — F419 Anxiety disorder, unspecified: Secondary | ICD-10-CM | POA: Diagnosis not present

## 2017-12-06 DIAGNOSIS — Z87891 Personal history of nicotine dependence: Secondary | ICD-10-CM | POA: Insufficient documentation

## 2017-12-06 DIAGNOSIS — Z01812 Encounter for preprocedural laboratory examination: Secondary | ICD-10-CM | POA: Diagnosis present

## 2017-12-06 DIAGNOSIS — M1711 Unilateral primary osteoarthritis, right knee: Secondary | ICD-10-CM | POA: Insufficient documentation

## 2017-12-06 DIAGNOSIS — I10 Essential (primary) hypertension: Secondary | ICD-10-CM | POA: Insufficient documentation

## 2017-12-06 HISTORY — DX: Unspecified injury of right foot, initial encounter: S99.921A

## 2017-12-06 LAB — URINALYSIS, ROUTINE W REFLEX MICROSCOPIC
BILIRUBIN URINE: NEGATIVE
GLUCOSE, UA: NEGATIVE mg/dL
HGB URINE DIPSTICK: NEGATIVE
Ketones, ur: NEGATIVE mg/dL
Leukocytes, UA: NEGATIVE
Nitrite: NEGATIVE
PH: 5 (ref 5.0–8.0)
Protein, ur: NEGATIVE mg/dL
SPECIFIC GRAVITY, URINE: 1.018 (ref 1.005–1.030)

## 2017-12-06 NOTE — Progress Notes (Signed)
Patient came in for pre-op for appt for upcoming total Right knee arthroplasty accompanied by her daughter. Patient presented in a  Wheelchair and a orthopedic boot on her right foot and daughter was pushing. Before RN could start appt, patient reported that she had tooth pulled yesterday and was told that she had an infection. She reports the dentist told her that he was confident the infection would drain on its own but also gave her prescription for  a "z pack" antibiotic. Patient wanting to now if she needs to take the antibiotic. RN informed that if she was given prescription, it may be beneficial to start the antibiotic since she has been told that she does have a current infection but to make sure to make her surgeons office aware. Patient verbalized understanding. Then patient reported that she recently broke her right foot. She expressed concern asking if having a broken foot on the same the as the upcoming knee replacement would affect if he could even have the surgery or not. RN informed patient that the decision would need to be made by her surgeon. RN advised patient to call surgeons office and make them aware of the injury. Patient asked for office number;  RN gave them [facility] number [phone] and advised that when they call, they should ask to speak to the surgery scheduler or Dr Aluisio's PA. Patient then questioned if she would still have to do pre-op appt today since she is unsure if she will be able to have the surgery still.  RN made patient and daughter aware that everything done at pre-op appt is valid for 30 days. Patient expressed concern about whether her insurance would cover another pre-op appt if she is cancelled and not put back on schedule within 30 days. RN informed patient that insurance coverage questions would need to be addressed with her surgeon. Patient states that she has an appointment with foot doctor Dr Doran Durand at [facility] on Friday at 10:00 to determine severity  of the injury and that would help determine if she could proceed. RN suggested that since she is concerned about insurance not paying for another pre-op appt if she is cancelled, patient could call us back on Friday after appt with foot doctor when she gets more info about whether she can proceed and RN would be happy to stay late and see patient for pre-op appt at a 3:00pm time. RN provided patient with direct office number. Patent agreeable to this arrangement. RN again strongly encouraged patient to call surgeon office ASAP to make them aware. Patient verbalized understanding. RN then called and LVMM for Dr Aluisio's surgery scheduler Glendale Chard to make aware of abovementioned . RN to continue to F/U. This message will be routed to Dr Wynelle Link via epic fax .

## 2017-12-09 ENCOUNTER — Encounter (HOSPITAL_COMMUNITY)
Admission: RE | Admit: 2017-12-09 | Discharge: 2017-12-09 | Disposition: A | Discharge status: Home / Self Care | Payer: Medicare Other | Source: Ambulatory Visit | Attending: Orthopedic Surgery | Admitting: Orthopedic Surgery

## 2017-12-09 ENCOUNTER — Other Ambulatory Visit: Payer: Self-pay

## 2017-12-09 ENCOUNTER — Encounter (HOSPITAL_COMMUNITY): Payer: Self-pay

## 2017-12-09 DIAGNOSIS — M79671 Pain in right foot: Secondary | ICD-10-CM | POA: Diagnosis not present

## 2017-12-09 DIAGNOSIS — Z01812 Encounter for preprocedural laboratory examination: Secondary | ICD-10-CM | POA: Diagnosis not present

## 2017-12-09 LAB — COMPREHENSIVE METABOLIC PANEL
ALK PHOS: 53 U/L (ref 38–126)
ALT: 18 U/L (ref 0–44)
AST: 23 U/L (ref 15–41)
Albumin: 3.9 g/dL (ref 3.5–5.0)
Anion gap: 7 (ref 5–15)
BILIRUBIN TOTAL: 0.6 mg/dL (ref 0.3–1.2)
BUN: 19 mg/dL (ref 8–23)
CALCIUM: 9.3 mg/dL (ref 8.9–10.3)
CO2: 31 mmol/L (ref 22–32)
Chloride: 105 mmol/L (ref 98–111)
Creatinine, Ser: 0.78 mg/dL (ref 0.44–1.00)
GFR calc Af Amer: >60 mL/min (ref >60)
GFR calc non Af Amer: >60 mL/min (ref >60)
Glucose, Bld: 95 mg/dL (ref 70–99)
POTASSIUM: 3.5 mmol/L (ref 3.5–5.1)
Sodium: 143 mmol/L (ref 135–145)
TOTAL PROTEIN: 7.4 g/dL (ref 6.5–8.1)

## 2017-12-09 LAB — CBC
HEMATOCRIT: 38.6 % (ref 36.0–46.0)
HEMOGLOBIN: 12.8 g/dL (ref 12.0–15.0)
MCH: 31.1 pg (ref 26.0–34.0)
MCHC: 33.2 g/dL (ref 30.0–36.0)
MCV: 93.7 fL (ref 78.0–100.0)
Platelets: 203 10*3/uL (ref 150–400)
RBC: 4.12 MIL/uL (ref 3.87–5.11)
RDW: 13.6 % (ref 11.5–15.5)
WBC: 6.6 10*3/uL (ref 4.0–10.5)

## 2017-12-09 LAB — SURGICAL PCR SCREEN
MRSA, PCR: NEGATIVE
Staphylococcus aureus: NEGATIVE

## 2017-12-09 LAB — PROTIME-INR
INR: 0.94
Prothrombin Time: 12.5 seconds (ref 11.4–15.2)

## 2017-12-09 LAB — APTT: APTT: 30 s (ref 24–36)

## 2017-12-09 LAB — ABO/RH: ABO/RH(D): O POS

## 2017-12-09 NOTE — Progress Notes (Signed)
This is f/u to RN note composed in 12-07-47.  daughter of patient called to reports that Offner had been to to appointment with the foot doctor and was cleared to proceed with surgery on 12-12-17. RN as mentioned will accommodate patient and see patient for a late 300pm appointment today for he pre-op.   RN called and LVMM for Aluisio surgery scheduler Glendale Chard to request that clearance for surgery be faxed ASAP.   RN will continue to f/u

## 2017-12-09 NOTE — Progress Notes (Signed)
Surgical clearance Dr Wylene Simmer 12-09-17 on chart

## 2017-12-09 NOTE — Progress Notes (Signed)
RN called and spoke to Sharee Pimple , Designer, jewellery at Dr Wynelle Link office. Sharee Pimple to fax Dr Doran Durand clearance for surgery  to [facility]

## 2017-12-09 NOTE — Patient Instructions (Signed)
Elizabeth [name]  12/09/2017   Your procedure is scheduled on: 12-12-17    Report to [facility] Main  Entrance    Report to admitting at 6:00AM    Call this number if you have problems the morning of surgery [phone]     Remember: Do not eat food or drink liquids :After Midnight.     Take these medicines the morning of surgery with A SIP OF WATER: oxycodone if needed, z- pack antibiotic if still taking                                 You may not have any metal on your body including hair pins and              piercings  Do not wear jewelry, make-up, lotions, powders or perfumes, deodorant             Do not wear nail polish.  Do not shave  48 hours prior to surgery.        Do not bring valuables to the hospital. [place].  Contacts, dentures or bridgework may not be worn into surgery.  Leave suitcase in the car. After surgery it may be brought to your room.                 Please read over the following fact sheets you were given: _____________________________________________________________________             [facility] - Preparing for Surgery Before surgery, you can play an important role.  Because skin is not sterile, your skin needs to be as free of germs as possible.  You can reduce the number of germs on your skin by washing with CHG (chlorahexidine gluconate) soap before surgery.  CHG is an antiseptic cleaner which kills germs and bonds with the skin to continue killing germs even after washing. Please DO NOT use if you have an allergy to CHG or antibacterial soaps.  If your skin becomes reddened/irritated stop using the CHG and inform your nurse when you arrive at Short Stay. Do not shave (including legs and underarms) for at least 48 hours prior to the first CHG shower.  You may shave your face/neck. Please follow these instructions carefully:  1.  Shower with CHG Soap the night before  surgery and the  morning of Surgery.  2.  If you choose to wash your hair, wash your hair first as usual with your  normal  shampoo.  3.  After you shampoo, rinse your hair and body thoroughly to remove the  shampoo.                           4.  Use CHG as you would any other liquid soap.  You can apply chg directly  to the skin and wash                       Gently with a scrungie or clean washcloth.  5.  Apply the CHG Soap to your body ONLY FROM THE NECK DOWN.   Do not use on face/ open  Wound or open sores. Avoid contact with eyes, ears mouth and genitals (private parts).                       Wash face,  Genitals (private parts) with your normal soap.             6.  Wash thoroughly, paying special attention to the area where your surgery  will be performed.  7.  Thoroughly rinse your body with warm water from the neck down.  8.  DO NOT shower/wash with your normal soap after using and rinsing off  the CHG Soap.                9.  Pat yourself dry with a clean towel.            10.  Wear clean pajamas.            11.  Place clean sheets on your bed the night of your first shower and do not  sleep with pets. Day of Surgery : Do not apply any lotions/deodorants the morning of surgery.  Please wear clean clothes to the hospital/surgery center.  FAILURE TO FOLLOW THESE INSTRUCTIONS MAY RESULT IN THE CANCELLATION OF YOUR SURGERY PATIENT SIGNATURE_________________________________  NURSE SIGNATURE__________________________________  ________________________________________________________________________   [name]  An incentive spirometer is a tool that can help keep your lungs clear and active. This tool measures how well you are filling your lungs with each breath. Taking long deep breaths may help reverse or decrease the chance of developing breathing (pulmonary) problems (especially infection) following:  A long period of time when you are unable to  move or be active. BEFORE THE PROCEDURE   If the spirometer includes an indicator to show your best effort, your nurse or respiratory therapist will set it to a desired goal.  If possible, sit up straight or lean slightly forward. Try not to slouch.  Hold the incentive spirometer in an upright position. INSTRUCTIONS FOR USE  1. Sit on the edge of your bed if possible, or sit up as far as you can in bed or on a chair. 2. Hold the incentive spirometer in an upright position. 3. Breathe out normally. 4. Place the mouthpiece in your mouth and seal your lips tightly around it. 5. Breathe in slowly and as deeply as possible, raising the piston or the ball toward the top of the column. 6. Hold your breath for 3-5 seconds or for as long as possible. Allow the piston or ball to fall to the bottom of the column. 7. Remove the mouthpiece from your mouth and breathe out normally. 8. Rest for a few seconds and repeat Steps 1 through 7 at least 10 times every 1-2 hours when you are awake. Take your time and take a few normal breaths between deep breaths. 9. The spirometer may include an indicator to show your best effort. Use the indicator as a goal to work toward during each repetition. 10. After each set of 10 deep breaths, practice coughing to be sure your lungs are clear. If you have an incision (the cut made at the time of surgery), support your incision when coughing by placing a pillow or rolled up towels firmly against it. Once you are able to get out of bed, walk around indoors and cough well. You may stop using the incentive spirometer when instructed by your caregiver.  RISKS AND COMPLICATIONS  Take your time so you do not get  dizzy or light-headed.  If you are in pain, you may need to take or ask for pain medication before doing incentive spirometry. It is harder to take a deep breath if you are having pain. AFTER USE  Rest and breathe slowly and easily.  It can be helpful to keep track of  a log of your progress. Your caregiver can provide you with a simple table to help with this. If you are using the spirometer at home, follow these instructions: [place] IF:   You are having difficultly using the spirometer.  You have trouble using the spirometer as often as instructed.  Your pain medication is not giving enough relief while using the spirometer.  You develop fever of 100.5 F (38.1 C) or higher. SEEK IMMEDIATE MEDICAL CARE IF:   You cough up bloody sputum that had not been present before.  You develop fever of 102 F (38.9 C) or greater.  You develop worsening pain at or near the incision site. MAKE SURE YOU:   Understand these instructions.  Will watch your condition.  Will get help right away if you are not doing well or get worse. Document Released: 08/30/2006 Document Revised: 07/12/2011 Document Reviewed: 10/31/2006 ExitCare Patient Information 2014 Exit[employer], [place].   ________________________________________________________________________  WHAT IS A BLOOD TRANSFUSION? Blood Transfusion Information  A transfusion is the replacement of blood or some of its parts. Blood is made up of multiple cells which provide different functions.  Red blood cells carry oxygen and are used for blood loss replacement.  White blood cells fight against infection.  Platelets control bleeding.  Plasma helps clot blood.  Other blood products are available for specialized needs, such as hemophilia or other clotting disorders. BEFORE THE TRANSFUSION  Who gives blood for transfusions?   Healthy volunteers who are fully evaluated to make sure their blood is safe. This is blood bank blood. Transfusion therapy is the safest it has ever been in the practice of medicine. Before blood is taken from a donor, a complete history is taken to make sure that person has no history of diseases nor engages in risky social behavior (examples are intravenous drug use or sexual  activity with multiple partners). The donor's travel history is screened to minimize risk of transmitting infections, such as malaria. The donated blood is tested for signs of infectious diseases, such as HIV and hepatitis. The blood is then tested to be sure it is compatible with you in order to minimize the chance of a transfusion reaction. If you or a relative donates blood, this is often done in anticipation of surgery and is not appropriate for emergency situations. It takes many days to process the donated blood. RISKS AND COMPLICATIONS Although transfusion therapy is very safe and saves many lives, the main dangers of transfusion include:   Getting an infectious disease.  Developing a transfusion reaction. This is an allergic reaction to something in the blood you were given. Every precaution is taken to prevent this. The decision to have a blood transfusion has been considered carefully by your caregiver before blood is given. Blood is not given unless the benefits outweigh the risks. AFTER THE TRANSFUSION  Right after receiving a blood transfusion, you will usually feel much better and more energetic. This is especially true if your red blood cells have gotten low (anemic). The transfusion raises the level of the red blood cells which carry oxygen, and this usually causes an energy increase.  The nurse administering the transfusion will  monitor you carefully for complications. HOME CARE INSTRUCTIONS  No special instructions are needed after a transfusion. You may find your energy is better. Speak with your caregiver about any limitations on activity for underlying diseases you may have. SEEK MEDICAL CARE IF:   Your condition is not improving after your transfusion.  You develop redness or irritation at the intravenous (IV) site. SEEK IMMEDIATE MEDICAL CARE IF:  Any of the following symptoms occur over the next 12 hours:  Shaking chills.  You have a temperature by mouth above 102 F  (38.9 C), not controlled by medicine.  Chest, back, or muscle pain.  People around you feel you are not acting correctly or are confused.  Shortness of breath or difficulty breathing.  Dizziness and fainting.  You get a rash or develop hives.  You have a decrease in urine output.  Your urine turns a dark color or changes to pink, red, or brown. Any of the following symptoms occur over the next 10 days:  You have a temperature by mouth above 102 F (38.9 C), not controlled by medicine.  Shortness of breath.  Weakness after normal activity.  The white part of the eye turns yellow (jaundice).  You have a decrease in the amount of urine or are urinating less often.  Your urine turns a dark color or changes to pink, red, or brown. Document Released: 04/16/2000 Document Revised: 07/12/2011 Document Reviewed: 12/04/2007 [facility] Patient Information 2014 [place], [place].  _______________________________________________________________________

## 2017-12-11 MED ORDER — BUPIVACAINE LIPOSOME 1.3 % IJ SUSP
20.0000 mL | Freq: Once | INTRAMUSCULAR | Status: DC
  Filled 2017-12-11: qty 20

## 2017-12-11 MED ORDER — VANCOMYCIN HCL 10 G IV SOLR
1500.0000 mg | INTRAVENOUS | Status: AC
  Administered 2017-12-12: 1500 mg via INTRAVENOUS
  Filled 2017-12-11: qty 1500

## 2017-12-11 MED ORDER — TRANEXAMIC ACID 1000 MG/10ML IV SOLN
2000.0000 mg | INTRAVENOUS | Status: DC
  Filled 2017-12-11: qty 20

## 2017-12-11 NOTE — H&P (Signed)
TOTAL KNEE ADMISSION H&P  Patient is being admitted for right total knee arthroplasty.  Subjective:  Chief Complaint:right knee pain.  HPI: Elizabeth [name], 67 y.o. female, has a history of pain and functional disability in the right knee due to arthritis and has failed non-surgical conservative treatments for greater than 12 weeks to includeNSAID's and/or analgesics, corticosteriod injections, flexibility and strengthening excercises and activity modification.  Onset of symptoms was gradual, starting >10 years ago with gradually worsening course since that time. The patient noted no past surgery on the right knee(s).  Patient currently rates pain in the right knee(s) at 9 out of 10 with activity. Patient has night pain, worsening of pain with activity and weight bearing, pain that interferes with activities of daily living, pain with passive range of motion, crepitus and joint swelling.  Patient has evidence of periarticular osteophytes and joint space narrowing by imaging studies.There is no active infection.  Patient Active Problem List   Diagnosis Date Noted  . Idiopathic chronic venous hypertension of both lower extremities with inflammation 06/29/2016  . Subtalar joint instability, left 06/29/2016  . Arthritis of right knee 01/30/2016  . Lower extremity edema 10/21/2015  . Leg cramps 08/21/2015  . Left ankle pain 04/09/2015  . Anxiety 01/20/2015  . Aortic stenosis, mild 01/08/2015  . Morbid obesity ([place]) 01/08/2015  . Varicose veins 01/02/2015  . Snoring 12/06/2014  . Viral gastroenteritis 04/06/2013  . Insect bite of ankle, right, infected 07/31/2012  . Primary cancer of right upper lobe of lung ([place]) 10/18/2011  . Hip pain 06/29/2011  . Pain in joint, lower leg 03/17/2009  . GASTROENTERITIS 07/11/2008  . PULMONARY NODULE, SOLITARY 04/18/2008  . LIVER FUNCTION TESTS, ABNORMAL, HX OF 04/01/2008  . B12 DEFICIENCY 11/13/2007  . COUGH 11/13/2007  . UNSPECIFIED VITAMIN D DEFICIENCY  11/07/2007  . TINEA PEDIS 07/07/2007  . LYMPHADENOPATHY 11/16/2006  . Hyperlipidemia 09/15/2006  . Essential hypertension 09/15/2006  . LOW BACK PAIN 09/15/2006   Past Medical History:  Diagnosis Date  . Aortic stenosis, mild 01/08/2015  . Hx of cancer of lung 6/10   Removed June 2010  . Hyperlipidemia   . Hypertension   . Low back pain   . Morbid obesity ([place]) 01/08/2015  . Right foot injury    report she sustained a fracture to her right , present with ortho boot on affected extremity   . Vitamin D deficiency     Past Surgical History:  Procedure Laterality Date  . ANKLE FUSION Left   . CHOLECYSTECTOMY    . LUNG REMOVAL, PARTIAL    . TUBAL LIGATION      Current Facility-Administered Medications  Medication Dose Route Frequency Provider Last Rate Last Dose  . [START ON 12/12/2017] bupivacaine liposome (EXPAREL) 1.3 % injection 266 mg  20 mL Other Once Gaynelle Arabian, MD      . Derrill Memo ON 12/12/2017] tranexamic acid (CYKLOKAPRON) 2,000 mg in sodium chloride 0.9 % 50 mL Topical Application  6,160 mg Topical To OR Aluisio, Pilar Plate, MD      . Derrill Memo ON 12/12/2017] vancomycin (VANCOCIN) 1,500 mg in sodium chloride 0.9 % 500 mL IVPB  1,500 mg Intravenous On Call to OR Gaynelle Arabian, MD       Current Outpatient Medications  Medication Sig Dispense Refill Last Dose  . aspirin EC 81 MG tablet Take 81 mg by mouth daily at 12 noon. Midday     . Cholecalciferol (VITAMIN D3) 5000 units TABS Take 5,000 Units by mouth daily  at 12 noon. Midday     . Homeopathic Products (LEG CRAMPS PO) Take 3 tablets by mouth daily as needed (for leg cramps). HYLAND BRAND     . MAGNESIUM PO Take 3 capsules by mouth daily at 6 PM. Cramp Defense (Truemag)     . Multiple Vitamin (MULTIVITAMIN WITH MINERALS) TABS tablet Take 1 tablet by mouth daily at 12 noon. NATURELO SUPPLEMENT (Midday)     . olmesartan-hydrochlorothiazide (BENICAR HCT) 20-12.5 MG tablet Take 1 tablet by mouth daily. (Patient taking differently: Take 1  tablet by mouth daily at 12 noon. Midday) 30 tablet 2 Taking  . oxyCODONE (OXY IR/ROXICODONE) 5 MG immediate release tablet Take 5 mg by mouth every 8 (eight) hours as needed. For pain.     Marland Kitchen azithromycin (ZITHROMAX) 1 g powder Take 1 g by mouth once.     Marland Kitchen HYDROcodone-acetaminophen (NORCO) 5-325 MG tablet Take 1 tablet by mouth every 6 (six) hours as needed (for pain). (Patient not taking: Reported on 12/02/2017) 20 tablet 0 Not Taking at Unknown time   Allergies  Allergen Reactions  . Penicillins Other (See Comments)    Fever Has patient had a PCN reaction causing immediate rash, facial/tongue/throat swelling, SOB or lightheadedness with hypotension: Unknown Has patient had a PCN reaction causing severe rash involving mucus membranes or skin necrosis: Unknown Has patient had a PCN reaction that required hospitalization: Unknown Has patient had a PCN reaction occurring within the last 10 years: No Childhood allergy If all of the above answers are "NO", then may proceed with Cephalosporin use.     Social History   Tobacco Use  . Smoking status: Former Smoker    Types: Cigarettes    Last attempt to quit: 02/01/2005    Years since quitting: 12.8  . Smokeless tobacco: Never Used  Substance Use Topics  . Alcohol use: No    Alcohol/week: 0.0 standard drinks    Family History  Problem Relation Age of Onset  . Hypertension Mother   . Stroke Mother   . Cancer Mother        lung  . Heart disease Father        cabg  . Cancer Brother        skin     Review of Systems  Constitutional: Negative.   HENT: Negative.   Eyes: Negative.   Respiratory: Positive for shortness of breath. Negative for cough, hemoptysis, sputum production and wheezing.   Cardiovascular: Negative.   Gastrointestinal: Negative.   Genitourinary: Negative.   Musculoskeletal: Positive for back pain, joint pain and myalgias. Negative for falls and neck pain.  Skin: Negative.   Neurological: Negative.    Endo/Heme/Allergies: Negative.   Psychiatric/Behavioral: Negative.     Objective:  Physical Exam  Constitutional: She is oriented to person, place, and time. She appears well-developed. No distress.  Morbidly obese  HENT:  Head: Normocephalic and atraumatic.  Right Ear: External ear normal.  Left Ear: External ear normal.  Nose: Nose normal.  Mouth/Throat: Oropharynx is clear and moist.  Eyes: Conjunctivae and EOM are normal.  Neck: Normal range of motion. Neck supple.  Cardiovascular: Normal rate, regular rhythm and intact distal pulses.  Murmur heard.  Systolic murmur is present with a grade of 3/6. Respiratory: Effort normal and breath sounds normal. No respiratory distress. She has no wheezes.  GI: Soft. Bowel sounds are normal. She exhibits no distension. There is no tenderness.  Musculoskeletal:  Right Hip Exam: ROM: is normal without discomfort.  There  is no tenderness over the greater trochanter.  There is no pain on provocative testing of the hip.  Right Knee Exam:  No effusion.  Range of motion is 10-125 degrees.  Marked crepitus on range of motion of the knee.  No medial or lateral joint line tenderness.  Stable knee.  Left Knee Exam:  No effusion.  Range of motion is 0-125 degrees.  No crepitus on range of motion of the knee.  No medial or lateral joint line tenderness.  Stable knee.  Neurological: She is alert and oriented to person, place, and time. She has normal strength. No sensory deficit.  Skin: No rash noted. She is not diaphoretic. No erythema.  Psychiatric: She has a normal mood and affect. Her behavior is normal.    Vital Signs BP: 122/74 HR: 68 bpm   Estimated body mass index is 38.27 kg/m as calculated from the following:   Height as of 12/09/17: 5\' 5"  (1.651 m).   Weight as of 12/09/17: 104.3 kg.   Imaging Review Plain radiographs demonstrate severe degenerative joint disease of the right knee(s). The overall alignment ismild varus.  The bone quality appears to be good for age and reported activity level.   Preoperative templating of the joint replacement has been completed, documented, and submitted to the Operating Room personnel in order to optimize intra-operative equipment management.   Anticipated LOS equal to or greater than 2 midnights due to - Age 53 and older with one or more of the following:  - Obesity  - Expected need for hospital services (PT, OT, Nursing) required for safe  discharge  - Anticipated need for postoperative skilled nursing care or inpatient rehab  - Active co-morbidities: Aortic stenosis OR   - Unanticipated findings during/Post Surgery: None  - Patient is a high risk of re-admission due to: None     Assessment/Plan:  End stage primary osteoarthritis, right knee   The patient history, physical examination, clinical judgment of the provider and imaging studies are consistent with end stage degenerative joint disease of the right knee(s) and total knee arthroplasty is deemed medically necessary. The treatment options including medical management, injection therapy arthroscopy and arthroplasty were discussed at length. The risks and benefits of total knee arthroplasty were presented and reviewed. The risks due to aseptic loosening, infection, stiffness, patella tracking problems, thromboembolic complications and other imponderables were discussed. The patient acknowledged the explanation, agreed to proceed with the plan and consent was signed. Patient is being admitted for inpatient treatment for surgery, pain control, PT, OT, prophylactic antibiotics, VTE prophylaxis, progressive ambulation and ADL's and discharge planning. The patient is planning to be discharged home   Therapy Plans: outpatient therapy at [facility] Disposition: Home with family DME needed: none PCP: Dr. Katherine Roan Cardio: [address]   [place], [place]

## 2017-12-12 ENCOUNTER — Inpatient Hospital Stay (HOSPITAL_COMMUNITY): Payer: Medicare Other | Admitting: Anesthesiology

## 2017-12-12 ENCOUNTER — Encounter (HOSPITAL_COMMUNITY): Payer: Self-pay

## 2017-12-12 ENCOUNTER — Inpatient Hospital Stay (HOSPITAL_COMMUNITY)
Admission: RE | Admit: 2017-12-12 | Discharge: 2017-12-14 | DRG: 470 | Disposition: A | Discharge status: Home / Self Care | Payer: Medicare Other | Attending: Orthopedic Surgery | Admitting: Orthopedic Surgery

## 2017-12-12 ENCOUNTER — Other Ambulatory Visit: Payer: Self-pay

## 2017-12-12 ENCOUNTER — Encounter (HOSPITAL_COMMUNITY)
Admission: RE | Disposition: A | Discharge status: Home / Self Care | Payer: Self-pay | Source: Home / Self Care | Attending: Orthopedic Surgery

## 2017-12-12 DIAGNOSIS — I10 Essential (primary) hypertension: Secondary | ICD-10-CM | POA: Diagnosis present

## 2017-12-12 DIAGNOSIS — Z87891 Personal history of nicotine dependence: Secondary | ICD-10-CM

## 2017-12-12 DIAGNOSIS — Z8249 Family history of ischemic heart disease and other diseases of the circulatory system: Secondary | ICD-10-CM

## 2017-12-12 DIAGNOSIS — Z6839 Body mass index (BMI) 39.0-39.9, adult: Secondary | ICD-10-CM

## 2017-12-12 DIAGNOSIS — M1710 Unilateral primary osteoarthritis, unspecified knee: Secondary | ICD-10-CM | POA: Diagnosis present

## 2017-12-12 DIAGNOSIS — I350 Nonrheumatic aortic (valve) stenosis: Secondary | ICD-10-CM | POA: Diagnosis present

## 2017-12-12 DIAGNOSIS — M1711 Unilateral primary osteoarthritis, right knee: Secondary | ICD-10-CM | POA: Diagnosis present

## 2017-12-12 DIAGNOSIS — Z880 Allergy status to penicillin: Secondary | ICD-10-CM | POA: Diagnosis not present

## 2017-12-12 DIAGNOSIS — Z7982 Long term (current) use of aspirin: Secondary | ICD-10-CM

## 2017-12-12 DIAGNOSIS — G8918 Other acute postprocedural pain: Secondary | ICD-10-CM | POA: Diagnosis not present

## 2017-12-12 DIAGNOSIS — Z79899 Other long term (current) drug therapy: Secondary | ICD-10-CM | POA: Diagnosis not present

## 2017-12-12 DIAGNOSIS — E785 Hyperlipidemia, unspecified: Secondary | ICD-10-CM | POA: Diagnosis not present

## 2017-12-12 DIAGNOSIS — M179 Osteoarthritis of knee, unspecified: Secondary | ICD-10-CM | POA: Diagnosis present

## 2017-12-12 HISTORY — PX: TOTAL KNEE ARTHROPLASTY: SHX125

## 2017-12-12 LAB — TYPE AND SCREEN
ABO/RH(D): O POS
ANTIBODY SCREEN: NEGATIVE

## 2017-12-12 SURGERY — ARTHROPLASTY, KNEE, TOTAL
Anesthesia: Spinal | Site: Knee | Laterality: Right

## 2017-12-12 MED ORDER — PROPOFOL 500 MG/50ML IV EMUL
INTRAVENOUS | Status: DC | PRN
  Administered 2017-12-12: 75 ug/kg/min via INTRAVENOUS

## 2017-12-12 MED ORDER — GABAPENTIN 300 MG PO CAPS
300.0000 mg | ORAL_CAPSULE | Freq: Three times a day (TID) | ORAL | Status: DC
  Administered 2017-12-12 – 2017-12-14 (×6): 300 mg via ORAL
  Filled 2017-12-12 (×5): qty 1

## 2017-12-12 MED ORDER — PHENYLEPHRINE HCL 10 MG/ML IJ SOLN
INTRAMUSCULAR | Status: DC | PRN
  Administered 2017-12-12: 25 ug/min via INTRAVENOUS

## 2017-12-12 MED ORDER — LACTATED RINGERS IV SOLN: INTRAVENOUS | Status: DC

## 2017-12-12 MED ORDER — FENTANYL CITRATE (PF) 100 MCG/2ML IJ SOLN
INTRAMUSCULAR | Status: AC
  Administered 2017-12-12: 50 ug via INTRAVENOUS
  Filled 2017-12-12: qty 2

## 2017-12-12 MED ORDER — DIPHENHYDRAMINE HCL 12.5 MG/5ML PO ELIX: 12.5000 mg | ORAL_SOLUTION | ORAL | Status: DC | PRN

## 2017-12-12 MED ORDER — ONDANSETRON HCL 4 MG/2ML IJ SOLN
INTRAMUSCULAR | Status: AC
  Filled 2017-12-12: qty 2

## 2017-12-12 MED ORDER — PROPOFOL 10 MG/ML IV BOLUS
INTRAVENOUS | Status: AC
  Filled 2017-12-12: qty 20

## 2017-12-12 MED ORDER — SODIUM CHLORIDE 0.9 % IJ SOLN
INTRAMUSCULAR | Status: DC | PRN
  Administered 2017-12-12: 60 mL

## 2017-12-12 MED ORDER — FENTANYL CITRATE (PF) 100 MCG/2ML IJ SOLN
50.0000 ug | INTRAMUSCULAR | Status: DC
  Administered 2017-12-12: 50 ug via INTRAVENOUS

## 2017-12-12 MED ORDER — METOCLOPRAMIDE HCL 5 MG/ML IJ SOLN: 5.0000 mg | Freq: Three times a day (TID) | INTRAMUSCULAR | Status: DC | PRN

## 2017-12-12 MED ORDER — BUPIVACAINE IN DEXTROSE 0.75-8.25 % IT SOLN
INTRATHECAL | Status: DC | PRN
  Administered 2017-12-12: 1.6 mL via INTRATHECAL

## 2017-12-12 MED ORDER — ROPIVACAINE HCL 7.5 MG/ML IJ SOLN
INTRAMUSCULAR | Status: DC | PRN
  Administered 2017-12-12: 20 mL via PERINEURAL

## 2017-12-12 MED ORDER — PHENOL 1.4 % MT LIQD
1.0000 | OROMUCOSAL | Status: DC | PRN
  Filled 2017-12-12: qty 177

## 2017-12-12 MED ORDER — MENTHOL 3 MG MT LOZG: 1.0000 | LOZENGE | OROMUCOSAL | Status: DC | PRN

## 2017-12-12 MED ORDER — ONDANSETRON HCL 4 MG/2ML IJ SOLN: 4.0000 mg | Freq: Four times a day (QID) | INTRAMUSCULAR | Status: DC | PRN

## 2017-12-12 MED ORDER — HYDROMORPHONE HCL 1 MG/ML IJ SOLN: 0.5000 mg | INTRAMUSCULAR | Status: DC | PRN

## 2017-12-12 MED ORDER — METHOCARBAMOL 500 MG PO TABS
500.0000 mg | ORAL_TABLET | Freq: Four times a day (QID) | ORAL | Status: DC | PRN
  Administered 2017-12-12 – 2017-12-14 (×4): 500 mg via ORAL
  Filled 2017-12-12 (×4): qty 1

## 2017-12-12 MED ORDER — FENTANYL CITRATE (PF) 100 MCG/2ML IJ SOLN: 25.0000 ug | INTRAMUSCULAR | Status: DC | PRN

## 2017-12-12 MED ORDER — OXYCODONE HCL 5 MG PO TABS
10.0000 mg | ORAL_TABLET | ORAL | Status: DC | PRN
  Administered 2017-12-13 (×2): 15 mg via ORAL
  Administered 2017-12-14: 10 mg via ORAL
  Filled 2017-12-12: qty 3
  Filled 2017-12-12: qty 2
  Filled 2017-12-12: qty 3

## 2017-12-12 MED ORDER — MIDAZOLAM HCL 2 MG/2ML IJ SOLN
1.0000 mg | INTRAMUSCULAR | Status: DC
  Administered 2017-12-12: 1 mg via INTRAVENOUS

## 2017-12-12 MED ORDER — PHENYLEPHRINE HCL 10 MG/ML IJ SOLN
INTRAMUSCULAR | Status: AC
  Filled 2017-12-12: qty 1

## 2017-12-12 MED ORDER — 0.9 % SODIUM CHLORIDE (POUR BTL) OPTIME
TOPICAL | Status: DC | PRN
  Administered 2017-12-12: 1000 mL

## 2017-12-12 MED ORDER — SODIUM CHLORIDE 0.9 % IJ SOLN
INTRAMUSCULAR | Status: AC
  Filled 2017-12-12: qty 10

## 2017-12-12 MED ORDER — RIVAROXABAN 10 MG PO TABS
10.0000 mg | ORAL_TABLET | Freq: Every day | ORAL | Status: DC
  Administered 2017-12-13 – 2017-12-14 (×2): 10 mg via ORAL
  Filled 2017-12-12 (×2): qty 1

## 2017-12-12 MED ORDER — POLYETHYLENE GLYCOL 3350 17 G PO PACK: 17.0000 g | PACK | Freq: Every day | ORAL | Status: DC | PRN

## 2017-12-12 MED ORDER — OXYCODONE HCL 5 MG/5ML PO SOLN
5.0000 mg | Freq: Once | ORAL | Status: DC | PRN
  Filled 2017-12-12: qty 5

## 2017-12-12 MED ORDER — OLMESARTAN MEDOXOMIL-HCTZ 20-12.5 MG PO TABS: 1.0000 | ORAL_TABLET | Freq: Every day | ORAL | Status: DC

## 2017-12-12 MED ORDER — TRANEXAMIC ACID 1000 MG/10ML IV SOLN
1000.0000 mg | Freq: Once | INTRAVENOUS | Status: AC
  Administered 2017-12-12: 1000 mg via INTRAVENOUS
  Filled 2017-12-12: qty 1000

## 2017-12-12 MED ORDER — VANCOMYCIN HCL IN DEXTROSE 1-5 GM/200ML-% IV SOLN
1000.0000 mg | Freq: Two times a day (BID) | INTRAVENOUS | Status: AC
  Administered 2017-12-12: 1000 mg via INTRAVENOUS
  Filled 2017-12-12: qty 200

## 2017-12-12 MED ORDER — PROPOFOL 10 MG/ML IV BOLUS
INTRAVENOUS | Status: DC | PRN
  Administered 2017-12-12: 20 mg via INTRAVENOUS
  Administered 2017-12-12: 10 mg via INTRAVENOUS

## 2017-12-12 MED ORDER — DEXAMETHASONE SODIUM PHOSPHATE 10 MG/ML IJ SOLN
INTRAMUSCULAR | Status: AC
  Filled 2017-12-12: qty 1

## 2017-12-12 MED ORDER — LACTATED RINGERS IV SOLN
INTRAVENOUS | Status: DC
  Administered 2017-12-12: 1000 mL via INTRAVENOUS
  Administered 2017-12-12: 11:00:00 via INTRAVENOUS

## 2017-12-12 MED ORDER — BUPIVACAINE LIPOSOME 1.3 % IJ SUSP
INTRAMUSCULAR | Status: DC | PRN
  Administered 2017-12-12: 20 mL

## 2017-12-12 MED ORDER — ACETAMINOPHEN 500 MG PO TABS
1000.0000 mg | ORAL_TABLET | Freq: Four times a day (QID) | ORAL | Status: AC
  Administered 2017-12-12 – 2017-12-13 (×4): 1000 mg via ORAL
  Filled 2017-12-12 (×4): qty 2

## 2017-12-12 MED ORDER — HYDROCHLOROTHIAZIDE 12.5 MG PO CAPS
12.5000 mg | ORAL_CAPSULE | Freq: Every day | ORAL | Status: DC
  Administered 2017-12-13 – 2017-12-14 (×2): 12.5 mg via ORAL
  Filled 2017-12-12 (×2): qty 1

## 2017-12-12 MED ORDER — OXYCODONE HCL 5 MG PO TABS
5.0000 mg | ORAL_TABLET | ORAL | Status: DC | PRN
  Administered 2017-12-12 – 2017-12-14 (×6): 10 mg via ORAL
  Filled 2017-12-12 (×6): qty 2

## 2017-12-12 MED ORDER — GABAPENTIN 300 MG PO CAPS
300.0000 mg | ORAL_CAPSULE | Freq: Once | ORAL | Status: AC
  Administered 2017-12-12: 300 mg via ORAL
  Filled 2017-12-12: qty 1

## 2017-12-12 MED ORDER — TRAMADOL HCL 50 MG PO TABS: 50.0000 mg | ORAL_TABLET | Freq: Four times a day (QID) | ORAL | Status: DC | PRN

## 2017-12-12 MED ORDER — VANCOMYCIN HCL IN DEXTROSE 1-5 GM/200ML-% IV SOLN
INTRAVENOUS | Status: AC
  Filled 2017-12-12: qty 200

## 2017-12-12 MED ORDER — PROPOFOL 10 MG/ML IV BOLUS
INTRAVENOUS | Status: AC
  Filled 2017-12-12: qty 40

## 2017-12-12 MED ORDER — TRANEXAMIC ACID 1000 MG/10ML IV SOLN: 1000.0000 mg | INTRAVENOUS | Status: DC

## 2017-12-12 MED ORDER — SODIUM CHLORIDE 0.9 % IV SOLN
INTRAVENOUS | Status: DC
  Administered 2017-12-12: 21:00:00 via INTRAVENOUS

## 2017-12-12 MED ORDER — STERILE WATER FOR IRRIGATION IR SOLN
Status: DC | PRN
  Administered 2017-12-12: 2000 mL

## 2017-12-12 MED ORDER — BISACODYL 10 MG RE SUPP: 10.0000 mg | Freq: Every day | RECTAL | Status: DC | PRN

## 2017-12-12 MED ORDER — CHLORHEXIDINE GLUCONATE 4 % EX LIQD: 60.0000 mL | Freq: Once | CUTANEOUS | Status: DC

## 2017-12-12 MED ORDER — DOCUSATE SODIUM 100 MG PO CAPS
100.0000 mg | ORAL_CAPSULE | Freq: Two times a day (BID) | ORAL | Status: DC
  Administered 2017-12-12 – 2017-12-14 (×4): 100 mg via ORAL
  Filled 2017-12-12 (×4): qty 1

## 2017-12-12 MED ORDER — METOCLOPRAMIDE HCL 5 MG PO TABS: 5.0000 mg | ORAL_TABLET | Freq: Three times a day (TID) | ORAL | Status: DC | PRN

## 2017-12-12 MED ORDER — ACETAMINOPHEN 10 MG/ML IV SOLN
1000.0000 mg | Freq: Four times a day (QID) | INTRAVENOUS | Status: DC
  Administered 2017-12-12: 1000 mg via INTRAVENOUS

## 2017-12-12 MED ORDER — SODIUM CHLORIDE 0.9 % IR SOLN
Status: DC | PRN
  Administered 2017-12-12: 1000 mL

## 2017-12-12 MED ORDER — PROPOFOL 10 MG/ML IV BOLUS
INTRAVENOUS | Status: AC
Start: 2017-12-12 — End: ?
  Filled 2017-12-12: qty 20

## 2017-12-12 MED ORDER — DEXAMETHASONE SODIUM PHOSPHATE 10 MG/ML IJ SOLN
8.0000 mg | Freq: Once | INTRAMUSCULAR | Status: AC
  Administered 2017-12-12: 10 mg via INTRAVENOUS

## 2017-12-12 MED ORDER — METHOCARBAMOL 500 MG IVPB - SIMPLE MED
500.0000 mg | Freq: Four times a day (QID) | INTRAVENOUS | Status: DC | PRN
  Filled 2017-12-12: qty 50

## 2017-12-12 MED ORDER — OXYCODONE HCL 5 MG PO TABS: 5.0000 mg | ORAL_TABLET | Freq: Once | ORAL | Status: DC | PRN

## 2017-12-12 MED ORDER — DEXAMETHASONE SODIUM PHOSPHATE 10 MG/ML IJ SOLN
10.0000 mg | Freq: Once | INTRAMUSCULAR | Status: AC
  Administered 2017-12-13: 10 mg via INTRAVENOUS
  Filled 2017-12-12: qty 1

## 2017-12-12 MED ORDER — ONDANSETRON HCL 4 MG PO TABS: 4.0000 mg | ORAL_TABLET | Freq: Four times a day (QID) | ORAL | Status: DC | PRN

## 2017-12-12 MED ORDER — FLEET ENEMA 7-19 GM/118ML RE ENEM
1.0000 | ENEMA | Freq: Once | RECTAL | Status: DC | PRN
Start: 2017-12-12 — End: 2017-12-14

## 2017-12-12 MED ORDER — TRANEXAMIC ACID 1000 MG/10ML IV SOLN
INTRAVENOUS | Status: AC
  Filled 2017-12-12: qty 10

## 2017-12-12 MED ORDER — ONDANSETRON HCL 4 MG/2ML IJ SOLN: 4.0000 mg | Freq: Once | INTRAMUSCULAR | Status: DC | PRN

## 2017-12-12 MED ORDER — IRBESARTAN 150 MG PO TABS
150.0000 mg | ORAL_TABLET | Freq: Every day | ORAL | Status: DC
  Administered 2017-12-13 – 2017-12-14 (×2): 150 mg via ORAL
  Filled 2017-12-12 (×2): qty 1

## 2017-12-12 MED ORDER — ACETAMINOPHEN 10 MG/ML IV SOLN
INTRAVENOUS | Status: AC
  Filled 2017-12-12: qty 100

## 2017-12-12 MED ORDER — TRANEXAMIC ACID 1000 MG/10ML IV SOLN
1000.0000 mg | INTRAVENOUS | Status: AC
  Administered 2017-12-12: 1000 mg via INTRAVENOUS
  Filled 2017-12-12: qty 10

## 2017-12-12 MED ORDER — MIDAZOLAM HCL 2 MG/2ML IJ SOLN
INTRAMUSCULAR | Status: AC
  Administered 2017-12-12: 1 mg via INTRAVENOUS
  Filled 2017-12-12: qty 2

## 2017-12-12 MED ORDER — SODIUM CHLORIDE 0.9 % IJ SOLN
INTRAMUSCULAR | Status: AC
  Filled 2017-12-12: qty 50

## 2017-12-12 MED ORDER — ONDANSETRON HCL 4 MG/2ML IJ SOLN
INTRAMUSCULAR | Status: DC | PRN
  Administered 2017-12-12: 4 mg via INTRAVENOUS

## 2017-12-12 SURGICAL SUPPLY — 59 items
ATTUNE MED ANAT PAT 35 KNEE (Knees) IMPLANT
ATTUNE MED ANAT PAT 35MM KNEE (Knees) IMPLANT
ATTUNE PSFEM RTSZ6 NARCEM KNEE (Femur) ×3 IMPLANT
ATTUNE PSRP INSR SZ6 10 KNEE (Insert) ×2 IMPLANT
ATTUNE PSRP INSR SZ6 10MM KNEE (Insert) ×1 IMPLANT
BAG SPEC THK2 15X12 ZIP CLS (MISCELLANEOUS) ×1
BAG ZIPLOCK 12X15 (MISCELLANEOUS) ×3 IMPLANT
BANDAGE ACE 6X5 VEL STRL LF (GAUZE/BANDAGES/DRESSINGS) ×3 IMPLANT
BLADE SAG 18X100X1.27 (BLADE) ×3 IMPLANT
BLADE SAW SGTL 11.0X1.19X90.0M (BLADE) ×3 IMPLANT
BOWL SMART MIX CTS (DISPOSABLE) ×3 IMPLANT
CEMENT HV SMART SET (Cement) ×6 IMPLANT
CLOSURE WOUND 1/2 X4 (GAUZE/BANDAGES/DRESSINGS) ×2
COVER SURGICAL LIGHT HANDLE (MISCELLANEOUS) ×3 IMPLANT
CUFF TOURN SGL QUICK 34 (TOURNIQUET CUFF) ×2
CUFF TRNQT CYL 34X4X40X1 (TOURNIQUET CUFF) ×1 IMPLANT
DECANTER SPIKE VIAL GLASS SM (MISCELLANEOUS) ×3 IMPLANT
DRAPE U-SHAPE 47X51 STRL (DRAPES) ×3 IMPLANT
DRSG ADAPTIC 3X8 NADH LF (GAUZE/BANDAGES/DRESSINGS) ×3 IMPLANT
DRSG PAD ABDOMINAL 8X10 ST (GAUZE/BANDAGES/DRESSINGS) ×3 IMPLANT
DURAPREP 26ML APPLICATOR (WOUND CARE) ×3 IMPLANT
ELECT REM PT RETURN 15FT ADLT (MISCELLANEOUS) ×3 IMPLANT
EVACUATOR 1/8 PVC DRAIN (DRAIN) ×3 IMPLANT
GAUZE SPONGE 4X4 12PLY STRL (GAUZE/BANDAGES/DRESSINGS) ×3 IMPLANT
GLOVE BIO SURGEON STRL SZ7 (GLOVE) ×3 IMPLANT
GLOVE BIO SURGEON STRL SZ8 (GLOVE) ×6 IMPLANT
GLOVE BIOGEL PI IND STRL 7.0 (GLOVE) ×2 IMPLANT
GLOVE BIOGEL PI IND STRL 8 (GLOVE) ×1 IMPLANT
GLOVE BIOGEL PI IND STRL 9 (GLOVE) ×1 IMPLANT
GLOVE BIOGEL PI INDICATOR 7.0 (GLOVE) ×4
GLOVE BIOGEL PI INDICATOR 8 (GLOVE) ×2
GLOVE BIOGEL PI INDICATOR 9 (GLOVE) ×2
GLOVE ECLIPSE 8.5 STRL (GLOVE) ×3 IMPLANT
GLOVE SURG SS PI 6.5 STRL IVOR (GLOVE) ×6 IMPLANT
GOWN STRL REUS W/TWL LRG LVL3 (GOWN DISPOSABLE) ×6 IMPLANT
HANDPIECE INTERPULSE COAX TIP (DISPOSABLE) ×3
HOLDER FOLEY CATH W/STRAP (MISCELLANEOUS) IMPLANT
IMMOBILIZER KNEE 20 (SOFTGOODS) ×6 IMPLANT
IMMOBILIZER KNEE 20 THIGH 36 (SOFTGOODS) ×1 IMPLANT
MANIFOLD NEPTUNE II (INSTRUMENTS) ×3 IMPLANT
NS IRRIG 1000ML POUR BTL (IV SOLUTION) ×3 IMPLANT
PACK TOTAL KNEE CUSTOM (KITS) ×3 IMPLANT
PAD ABD 8X10 STRL (GAUZE/BANDAGES/DRESSINGS) ×3 IMPLANT
PADDING CAST COTTON 6X4 STRL (CAST SUPPLIES) ×6 IMPLANT
PATELLA MEDIAL ATTUN 35MM KNEE (Knees) ×3 IMPLANT
POSITIONER SURGICAL ARM (MISCELLANEOUS) ×3 IMPLANT
SET HNDPC FAN SPRY TIP SCT (DISPOSABLE) ×1 IMPLANT
STRIP CLOSURE SKIN 1/2X4 (GAUZE/BANDAGES/DRESSINGS) ×4 IMPLANT
SUT MNCRL AB 4-0 PS2 18 (SUTURE) ×3 IMPLANT
SUT STRATAFIX 0 PDS 27 VIOLET (SUTURE) ×3
SUT VIC AB 2-0 CT1 27 (SUTURE) ×9
SUT VIC AB 2-0 CT1 TAPERPNT 27 (SUTURE) ×3 IMPLANT
SUTURE STRATFX 0 PDS 27 VIOLET (SUTURE) ×1 IMPLANT
TIBIAL BASE ROT PLAT SZ 6 KNEE (Miscellaneous) ×3 IMPLANT
TRAY FOLEY CATH 14FR (SET/KITS/TRAYS/PACK) ×3 IMPLANT
TRAY FOLEY MTR SLVR 16FR STAT (SET/KITS/TRAYS/PACK) ×3 IMPLANT
WATER STERILE IRR 1000ML POUR (IV SOLUTION) ×6 IMPLANT
WRAP KNEE MAXI GEL POST OP (GAUZE/BANDAGES/DRESSINGS) ×3 IMPLANT
YANKAUER SUCT BULB TIP 10FT TU (MISCELLANEOUS) ×3 IMPLANT

## 2017-12-12 NOTE — Anesthesia Procedure Notes (Signed)
Spinal  Patient location during procedure: OR End time: 12/12/2017 9:17 AM Staffing Anesthesiologist: Audry Pili, MD Resident/CRNA: Maxwell Caul, CRNA Performed: resident/CRNA  Preanesthetic Checklist Completed: patient identified, site marked, surgical consent, pre-op evaluation, timeout performed, IV checked, risks and benefits discussed and monitors and equipment checked Spinal Block Patient position: sitting Prep: DuraPrep Patient monitoring: heart rate, continuous pulse ox and blood pressure Approach: midline Location: L3-4 Injection technique: single-shot Needle Needle type: Pencan  Needle gauge: 24 G Needle length: 10 cm Additional Notes Patient sitting for SAB placement. Single shot, patient tolerated well. Negative heme/negative paresthesia. Dr Fransisco Beau at bedside during entire placement. Expiration date-01/31/2019. LOT # [id number].

## 2017-12-12 NOTE — Anesthesia Preprocedure Evaluation (Addendum)
Anesthesia Evaluation  Patient identified by MRN, date of birth, ID band Patient awake    Reviewed: Allergy & Precautions, NPO status , Patient's Chart, lab work & pertinent test results  History of Anesthesia Complications Negative for: history of anesthetic complications  Airway Mallampati: III  TM Distance: >3 FB Neck ROM: Full    Dental  (+) Poor Dentition   Pulmonary former smoker,   Hx lung cancer s/p partial lobectomy     breath sounds clear to auscultation       Cardiovascular hypertension, Pt. on medications + Valvular Problems/Murmurs AS  Rhythm:Regular Rate:Normal   '18 TTE - EF 60-65%, mild AS, mild MR    Neuro/Psych Anxiety negative neurological ROS     GI/Hepatic negative GI ROS, Neg liver ROS,   Endo/Other   Obesity   Renal/GU negative Renal ROS  negative genitourinary   Musculoskeletal  (+) Arthritis ,   Abdominal   Peds  Hematology negative hematology ROS (+)   Anesthesia Other Findings   Reproductive/Obstetrics                            Anesthesia Physical Anesthesia Plan  ASA: III  Anesthesia Plan: Spinal   Post-op Pain Management:  Regional for Post-op pain   Induction:   PONV Risk Score and Plan: 2 and Treatment may vary due to age or medical condition and Propofol infusion  Airway Management Planned: Natural Airway and Simple Face Mask  Additional Equipment: None  Intra-op Plan:   Post-operative Plan:   Informed Consent:   Plan Discussed with: CRNA and Anesthesiologist  Anesthesia Plan Comments: (Labs reviewed. Platelets acceptable, patient not taking any blood thinning medications. Risks and benefits discussed with patient, patient expressed understanding and wished to proceed.)        Anesthesia Quick Evaluation

## 2017-12-12 NOTE — Progress Notes (Signed)
Assisted Dr. Fransisco Beau with right, ultrasound guided, adductor canal block. Side rails up, monitors on throughout procedure. See vital signs in flow sheet. Tolerated Procedure well.

## 2017-12-12 NOTE — Discharge Instructions (Addendum)
° °Dr. Frank Aluisio °Total Joint Specialist °[facility] °[address] °[place], [place] [address] °[phone] ° °TOTAL KNEE REPLACEMENT POSTOPERATIVE DIRECTIONS ° °Knee Rehabilitation, Guidelines Following Surgery  °Results after knee surgery are often greatly improved when you follow the exercise, range of motion and muscle strengthening exercises prescribed by your doctor. Safety measures are also important to protect the knee from further injury. Any time any of these exercises cause you to have increased pain or swelling in your knee joint, decrease the amount until you are comfortable again and slowly increase them. If you have problems or questions, call your caregiver or physical therapist for advice.  ° °HOME CARE INSTRUCTIONS  °• Remove items at home which could result in a fall. This includes throw rugs or furniture in walking pathways.  °· ICE to the affected knee every three hours for 30 minutes at a time and then as needed for pain and swelling.  Continue to use ice on the knee for pain and swelling from surgery. You may notice swelling that will progress down to the foot and ankle.  This is normal after surgery.  Elevate the leg when you are not up walking on it.   °· Continue to use the breathing machine which will help keep your temperature down.  It is common for your temperature to cycle up and down following surgery, especially at night when you are not up moving around and exerting yourself.  The breathing machine keeps your lungs expanded and your temperature down. °· Do not place pillow under knee, focus on keeping the knee straight while resting ° °DIET °You may resume your previous home diet once your are discharged from the hospital. ° °DRESSING / WOUND CARE / SHOWERING °You may shower 3 days after surgery, but keep the wounds dry during showering.  You may use an occlusive plastic wrap (Press'n Seal for example), NO SOAKING/SUBMERGING IN THE BATHTUB.  If the bandage  gets wet, change with a clean dry gauze.  If the incision gets wet, pat the wound dry with a clean towel. °You may start showering once you are discharged home but do not submerge the incision under water. Just pat the incision dry and apply a dry gauze dressing on daily. °Change the surgical dressing daily and reapply a dry dressing each time. ° °ACTIVITY °Walk with your walker as instructed. °Use walker as long as suggested by your caregivers. °Avoid periods of inactivity such as sitting longer than an hour when not asleep. This helps prevent blood clots.  °You may resume a sexual relationship in one month or when given the OK by your doctor.  °You may return to work once you are cleared by your doctor.  °Do not drive a car for 6 weeks or until released by you surgeon.  °Do not drive while taking narcotics. ° °WEIGHT BEARING °Weight bearing as tolerated with assist device (walker, cane, etc) as directed, use it as long as suggested by your surgeon or therapist, typically at least 4-6 weeks. ° °POSTOPERATIVE CONSTIPATION PROTOCOL °Constipation - defined medically as fewer than three stools per week and severe constipation as less than one stool per week. ° °One of the most common issues patients have following surgery is constipation.  Even if you have a regular bowel pattern at home, your normal regimen is likely to be disrupted due to multiple reasons following surgery.  Combination of anesthesia, postoperative narcotics, change in appetite and fluid intake all can affect your bowels.    In order to avoid complications following surgery, here are some recommendations in order to help you during your recovery period. ° °Colace (docusate) - Pick up an over-the-counter form of Colace or another stool softener and take twice a day as long as you are requiring postoperative pain medications.  Take with a full glass of water daily.  If you experience loose stools or diarrhea, hold the colace until you stool forms back  up.  If your symptoms do not get better within 1 week or if they get worse, check with your doctor. ° °Dulcolax (bisacodyl) - Pick up over-the-counter and take as directed by the product packaging as needed to assist with the movement of your bowels.  Take with a full glass of water.  Use this product as needed if not relieved by Colace only.  ° °MiraLax (polyethylene glycol) - Pick up over-the-counter to have on hand.  MiraLax is a solution that will increase the amount of water in your bowels to assist with bowel movements.  Take as directed and can mix with a glass of water, juice, soda, coffee, or tea.  Take if you go more than two days without a movement. °Do not use MiraLax more than once per day. Call your doctor if you are still constipated or irregular after using this medication for 7 days in a row. ° °If you continue to have problems with postoperative constipation, please contact the office for further assistance and recommendations.  If you experience "the worst abdominal pain ever" or develop nausea or vomiting, please contact the office immediatly for further recommendations for treatment. ° °ITCHING ° If you experience itching with your medications, try taking only a single pain pill, or even half a pain pill at a time.  You can also use Benadryl over the counter for itching or also to help with sleep.  ° °TED HOSE STOCKINGS °Wear the elastic stockings on both legs for three weeks following surgery during the day but you may remove then at night for sleeping. ° °MEDICATIONS °See your medication summary on the “After Visit Summary” that the nursing staff will review with you prior to discharge.  You may have some home medications which will be placed on hold until you complete the course of blood thinner medication.  It is important for you to complete the blood thinner medication as prescribed by your surgeon.  Continue your approved medications as instructed at time of discharge. ° °PRECAUTIONS °If  you experience chest pain or shortness of breath - call 911 immediately for transfer to the hospital emergency department.  °If you develop a fever greater that 101 F, purulent drainage from wound, increased redness or drainage from wound, foul odor from the wound/dressing, or calf pain - CONTACT YOUR SURGEON.   °                                                °FOLLOW-UP APPOINTMENTS °Make sure you keep all of your appointments after your operation with your surgeon and caregivers. You should call the office at the above phone number and make an appointment for approximately two weeks after the date of your surgery or on the date instructed by your surgeon outlined in the "After Visit Summary". ° ° °RANGE OF MOTION AND STRENGTHENING EXERCISES  °Rehabilitation of the knee is important following a knee injury or   an operation. After just a few days of immobilization, the muscles of the thigh which control the knee become weakened and shrink (atrophy). Knee exercises are designed to build up the tone and strength of the thigh muscles and to improve knee motion. Often times heat used for twenty to thirty minutes before working out will loosen up your tissues and help with improving the range of motion but do not use heat for the first two weeks following surgery. These exercises can be done on a training (exercise) mat, on the floor, on a table or on a bed. Use what ever works the best and is most comfortable for you Knee exercises include:   Leg Lifts - While your knee is still immobilized in a splint or cast, you can do straight leg raises. Lift the leg to 60 degrees, hold for 3 sec, and slowly lower the leg. Repeat 10-20 times 2-3 times daily. Perform this exercise against resistance later as your knee gets better.   Quad and Hamstring Sets - Tighten up the muscle on the front of the thigh (Quad) and hold for 5-10 sec. Repeat this 10-20 times hourly. Hamstring sets are done by pushing the foot backward against an  object and holding for 5-10 sec. Repeat as with quad sets.   Leg Slides: Lying on your back, slowly slide your foot toward your buttocks, bending your knee up off the floor (only go as far as is comfortable). Then slowly slide your foot back down until your leg is flat on the floor again.  Angel Wings: Lying on your back spread your legs to the side as far apart as you can without causing discomfort.  A rehabilitation program following serious knee injuries can speed recovery and prevent re-injury in the future due to weakened muscles. Contact your doctor or a physical therapist for more information on knee rehabilitation.   IF YOU ARE TRANSFERRED TO A SKILLED REHAB FACILITY If the patient is transferred to a skilled rehab facility following release from the hospital, a list of the current medications will be sent to the facility for the patient to continue.  When discharged from the skilled rehab facility, please have the facility set up the patient's [place] prior to being released. Also, the skilled facility will be responsible for providing the patient with their medications at time of release from the facility to include their pain medication, the muscle relaxants, and their blood thinner medication. If the patient is still at the rehab facility at time of the two week follow up appointment, the skilled rehab facility will also need to assist the patient in arranging follow up appointment in our office and any transportation needs.  MAKE SURE YOU:   Understand these instructions.   Get help right away if you are not doing well or get worse.    Pick up stool softner and laxative for home use following surgery while on pain medications. Do not submerge incision under water. Please use good hand washing techniques while changing dressing each day. May shower starting three days after surgery. Please use a clean towel to pat the incision dry following showers. Continue to  use ice for pain and swelling after surgery. Do not use any lotions or creams on the incision until instructed by your surgeon.   _____________________________________________  Information on my medicine - XARELTO (Rivaroxaban)  This medication education was reviewed with me or my healthcare representative as part of my discharge preparation.   Why was  Xarelto prescribed for you? Xarelto was prescribed for you to reduce the risk of blood clots forming after orthopedic surgery. The medical term for these abnormal blood clots is venous thromboembolism (VTE).  What do you need to know about xarelto ? Take your Xarelto ONCE DAILY at the same time every day. You may take it either with or without food.  If you have difficulty swallowing the tablet whole, you may crush it and mix in applesauce just prior to taking your dose.  Take Xarelto exactly as prescribed by your doctor and DO NOT stop taking Xarelto without talking to the doctor who prescribed the medication.  Stopping without other VTE prevention medication to take the place of Xarelto may increase your risk of developing a clot.  After discharge, you should have regular check-up appointments with your healthcare provider that is prescribing your Xarelto.    What do you do if you miss a dose? If you miss a dose, take it as soon as you remember on the same day then continue your regularly scheduled once daily regimen the next day. Do not take two doses of Xarelto on the same day.   Important Safety Information A possible side effect of Xarelto is bleeding. You should call your healthcare provider right away if you experience any of the following: ? Bleeding from an injury or your nose that does not stop. ? Unusual colored urine (red or dark brown) or unusual colored stools (red or black). ? Unusual bruising for unknown reasons. ? A serious fall or if you hit your head (even if there is no bleeding).  Some medicines may  interact with Xarelto and might increase your risk of bleeding while on Xarelto. To help avoid this, consult your healthcare provider or pharmacist prior to using any new prescription or non-prescription medications, including herbals, vitamins, non-steroidal anti-inflammatory drugs (NSAIDs) and supplements.  This website has more information on Xarelto: [email or web address]

## 2017-12-12 NOTE — Transfer of Care (Signed)
Immediate Anesthesia Transfer of Care Note  Patient: Elizabeth [name]  Procedure(s) Performed: RIGHT TOTAL KNEE ARTHROPLASTY (Right Knee)  Patient Location: PACU  Anesthesia Type:Spinal  Level of Consciousness: awake, alert , oriented and patient cooperative  Airway & Oxygen Therapy: Patient Spontanous Breathing and Patient connected to face mask oxygen  Post-op Assessment: Report given to RN and Post -op Vital signs reviewed and stable  Post vital signs: stable  Last Vitals:  Vitals Value Taken Time  BP 101/51 12/12/2017 11:00 AM  Temp    Pulse 69 12/12/2017 11:05 AM  Resp 23 12/12/2017 11:05 AM  SpO2 100 % 12/12/2017 11:05 AM  Vitals shown include unvalidated device data.  Last Pain:  Vitals:   12/12/17 0850  TempSrc:   PainSc: 0-No pain      Patients Stated Pain Goal: 4 (99/24/26 8341)  Complications: No apparent anesthesia complications

## 2017-12-12 NOTE — Anesthesia Postprocedure Evaluation (Signed)
Anesthesia Post Note  Patient: Elizabeth [name]  Procedure(s) Performed: RIGHT TOTAL KNEE ARTHROPLASTY (Right Knee)     Patient location during evaluation: PACU Anesthesia Type: Spinal Level of consciousness: awake and alert Pain management: pain level controlled Vital Signs Assessment: post-procedure vital signs reviewed and stable Respiratory status: spontaneous breathing, nonlabored ventilation, respiratory function stable and patient connected to nasal cannula oxygen Cardiovascular status: blood pressure returned to baseline and stable Postop Assessment: no apparent nausea or vomiting and spinal receding Anesthetic complications: no    Last Vitals:  Vitals:   12/12/17 1145 12/12/17 1157  BP: (!) 102/59   Pulse: 63 61  Resp: 13 14  Temp:    SpO2: 94% 95%    Last Pain:  Vitals:   12/12/17 1145  TempSrc:   PainSc: 0-No pain                 Audry Pili

## 2017-12-12 NOTE — Op Note (Signed)
OPERATIVE REPORT-TOTAL KNEE ARTHROPLASTY   Pre-operative diagnosis- Osteoarthritis  Right knee(s)  Post-operative diagnosis- Osteoarthritis Right knee(s)  Procedure-  Right  Total Knee Arthroplasty  Surgeon- Dione Plover. Adelard Sanon, MD  Assistant- Theresa Duty, PA-C   Anesthesia-  Adductor canal block and spinal  EBL-25 mL   Drains Hemovac  Tourniquet time-  Total Tourniquet Time Documented: Thigh (Right) - 37 minutes Total: Thigh (Right) - 37 minutes     Complications- None  Condition-PACU - hemodynamically stable.   Brief Clinical Note  Elizabeth [name] is a 67 y.o. 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 She has constant pain, with activity and at rest and significant functional deficits with difficulties even with ADLs. She has had extensive non-op management including analgesics, injections of cortisone and viscosupplements, and home exercise program, but remains in significant pain with significant dysfunction.Radiographs show bone on bone arthritis medial and patellofemoral. She presents now for right Total Knee Arthroplasty.    Procedure in detail---   The patient is brought into the operating room and positioned supine on the operating table. After successful administration of  Adductor canal block and spinal,   a tourniquet is placed high on the  Right thigh(s) and the lower extremity is prepped and draped in the usual sterile fashion. Time out is performed by the operating team and then the  Right lower extremity is wrapped in Esmarch, knee flexed and the tourniquet inflated to 300 mmHg.       A midline incision is made with a ten blade through the subcutaneous tissue to the level of the extensor mechanism. A fresh blade is used to make a medial parapatellar arthrotomy. Soft tissue over the proximal medial tibia is subperiosteally elevated to the joint line with a knife and into the semimembranosus bursa with a  Cobb elevator. Soft tissue over the proximal lateral tibia is elevated with attention being paid to avoiding the patellar tendon on the tibial tubercle. The patella is everted, knee flexed 90 degrees and the ACL and PCL are removed. Findings are bone on bone medial and patellofemoral with large global osteophytes.        The drill is used to create a starting hole in the distal femur and the canal is thoroughly irrigated with sterile saline to remove the fatty contents. The 5 degree Right  valgus alignment guide is placed into the femoral canal and the distal femoral cutting block is pinned to remove 10 mm off the distal femur. Resection is made with an oscillating saw.      The tibia is subluxed forward and the menisci are removed. The extramedullary alignment guide is placed referencing proximally at the medial aspect of the tibial tubercle and distally along the second metatarsal axis and tibial crest. The block is pinned to remove 55mm off the more deficient medial  side. Resection is made with an oscillating saw. Size 6is the most appropriate size for the tibia and the proximal tibia is prepared with the modular drill and keel punch for that size.      The femoral sizing guide is placed and size 6 is most appropriate. Rotation is marked off the epicondylar axis and confirmed by creating a rectangular flexion gap at 90 degrees. The size 6 cutting block is pinned in this rotation and the anterior, posterior and chamfer cuts are made with the oscillating saw. The intercondylar block is then placed and that cut is made.  Trial size 6 tibial component, trial size 6 narrow posterior stabilized femur and a 10  mm posterior stabilized rotating platform insert trial is placed. Full extension is achieved with excellent varus/valgus and anterior/posterior balance throughout full range of motion. The patella is everted and thickness measured to be 22  mm. Free hand resection is taken to 12 mm, a 35 template is  placed, lug holes are drilled, trial patella is placed, and it tracks normally. Osteophytes are removed off the posterior femur with the trial in place. All trials are removed and the cut bone surfaces prepared with pulsatile lavage. Cement is mixed and once ready for implantation, the size 6 tibial implant, size  6 narrow posterior stabilized femoral component, and the size 35 patella are cemented in place and the patella is held with the clamp. The trial insert is placed and the knee held in full extension. The Exparel (20 ml mixed with 60 ml saline) is injected into the extensor mechanism, posterior capsule, medial and lateral gutters and subcutaneous tissues.  All extruded cement is removed and once the cement is hard the permanent 10 mm posterior stabilized rotating platform insert is placed into the tibial tray.      The wound is copiously irrigated with saline solution and the extensor mechanism closed over a hemovac drain with #1 V-loc suture. The tourniquet is released for a total tourniquet time of 37  minutes. Flexion against gravity is 140 degrees and the patella tracks normally. Subcutaneous tissue is closed with 2.0 vicryl and subcuticular with running 4.0 Monocryl. The incision is cleaned and dried and steri-strips and a bulky sterile dressing are applied. The limb is placed into a knee immobilizer and the patient is awakened and transported to recovery in stable condition.      Please note that a surgical assistant was a medical necessity for this procedure in order to perform it in a safe and expeditious manner. Surgical assistant was necessary to retract the ligaments and vital neurovascular structures to prevent injury to them and also necessary for proper positioning of the limb to allow for anatomic placement of the prosthesis.   Dione Plover Caitlan Chauca, MD    12/12/2017, 10:22 AM

## 2017-12-12 NOTE — Evaluation (Signed)
Physical Therapy Evaluation Patient Details Name: Elizabeth [name] MRN: [id number] DOB: 09/18/1950 Today's Date: 12/12/2017   History of Present Illness  67 yo female s/p R TKA 12/12/17. Hx of obesity, L ankle arthrodesis, lung ca  Clinical Impression  On eval POD 0, pt required Min assist for mobility. She walked ~70 feet with a RW. Min-Mod pain with activity. Will follow and progress activity as tolerated. Per chart, plan is for pt to d/c home with OP PT f/u.     Follow Up Recommendations Follow surgeon's recommendation for DC plan and follow-up therapies    Equipment Recommendations  None recommended by PT    Recommendations for Other Services       Precautions / Restrictions Precautions Precautions: Fall Required Braces or Orthoses: Knee Immobilizer - Right Knee Immobilizer - Right: Discontinue once straight leg raise with < 10 degree lag Restrictions Weight Bearing Restrictions: No Other Position/Activity Restrictions: WBAT      Mobility  Bed Mobility Overal bed mobility: Needs Assistance Bed Mobility: Supine to Sit     Supine to sit: Min assist;HOB elevated     General bed mobility comments: small amount of assist for R LE. Increased time. Pt relied on bedrails.   Transfers Overall transfer level: Needs assistance Equipment used: Rolling walker (2 wheeled) Transfers: Sit to/from Stand Sit to Stand: Min assist;From elevated surface         General transfer comment: VCs safety, technique, hand/LE placement. Assist to rise, stabilize, control descent.   Ambulation/Gait Ambulation/Gait assistance: Min assist Gait Distance (Feet): 70 Feet Assistive device: Rolling walker (2 wheeled) Gait Pattern/deviations: Step-to pattern     General Gait Details: VCs safety, sequence, proper use of RW. Assist to stabilize throughout ambulation distance.   Stairs            Wheelchair Mobility    Modified Rankin (Stroke Patients Only)       Balance Overall  balance assessment: Mild deficits observed, not formally tested                                           Pertinent Vitals/Pain Pain Assessment: 0-10 Pain Score: 4  Pain Location: R knee Pain Descriptors / Indicators: Aching;Sore Pain Intervention(s): Monitored during session;Ice applied;Repositioned    Home Living Family/patient expects to be discharged to:: Private residence Living Arrangements: Children Available Help at Discharge: Family Type of Home: House Home Access: Stairs to enter Entrance Stairs-Rails: None Entrance Stairs-Number of Steps: 2 small steps Home Layout: One level Home Equipment: Environmental consultant - 2 wheels;Bedside commode;Cane - single point      Prior Function Level of Independence: Independent               Hand Dominance        Extremity/Trunk Assessment   Upper Extremity Assessment Upper Extremity Assessment: Generalized weakness    Lower Extremity Assessment Lower Extremity Assessment: Generalized weakness(s/p R TKA)    Cervical / Trunk Assessment Cervical / Trunk Assessment: Normal  Communication   Communication: No difficulties  Cognition Arousal/Alertness: Awake/alert Behavior During Therapy: WFL for tasks assessed/performed Overall Cognitive Status: Within Functional Limits for tasks assessed                                        General Comments  Exercises     Assessment/Plan    PT Assessment Patient needs continued PT services  PT Problem List Decreased strength;Decreased range of motion;Decreased mobility;Obesity;Decreased balance;Decreased knowledge of use of DME;Pain       PT Treatment Interventions DME instruction;Gait training;Functional mobility training;Therapeutic activities;Balance training;Patient/family education;Therapeutic exercise;Stair training    PT Goals (Current goals can be found in the Care Plan section)  Acute Rehab PT Goals Patient Stated Goal: regain mobility  and independence PT Goal Formulation: With patient/family Time For Goal Achievement: 12/26/17 Potential to Achieve Goals: Good    Frequency 7X/week   Barriers to discharge        Co-evaluation               AM-PAC PT "6 Clicks" Daily Activity  Outcome Measure Difficulty turning over in bed (including adjusting bedclothes, sheets and blankets)?: A Lot Difficulty moving from lying on back to sitting on the side of the bed? : Unable Difficulty sitting down on and standing up from a chair with arms (e.g., wheelchair, bedside commode, etc,.)?: Unable Help needed moving to and from a bed to chair (including a wheelchair)?: A Little Help needed walking in hospital room?: A Little Help needed climbing 3-5 steps with a railing? : A Lot 6 Click Score: 12    End of Session Equipment Utilized During Treatment: Gait belt;Right knee immobilizer Activity Tolerance: Patient tolerated treatment well Patient left: in chair;with call bell/phone within reach;with family/visitor present   PT Visit Diagnosis: Pain;Muscle weakness (generalized) (M62.81);Other abnormalities of gait and mobility (R26.89) Pain - Right/Left: Right Pain - part of body: Knee    Time: 1561-5379 PT Time Calculation (min) (ACUTE ONLY): 11 min   Charges:   PT Evaluation $PT Eval Low Complexity: 1 Low           Weston Anna, MPT Pager: [phone]

## 2017-12-12 NOTE — Interval H&P Note (Signed)
History and Physical Interval Note:  12/12/2017 6:31 AM  Elizabeth [name]  has presented today for surgery, with the diagnosis of Osteoarthritis Right Knee  The various methods of treatment have been discussed with the patient and family. After consideration of risks, benefits and other options for treatment, the patient has consented to  Procedure(s): RIGHT TOTAL KNEE ARTHROPLASTY (Right) as a surgical intervention .  The patient's history has been reviewed, patient examined, no change in status, stable for surgery.  I have reviewed the patient's chart and labs.  Questions were answered to the patient's satisfaction.     Pilar Plate Delos Klich

## 2017-12-12 NOTE — Anesthesia Procedure Notes (Signed)
Anesthesia Regional Block: Adductor canal block   Pre-Anesthetic Checklist: ,, timeout performed, Correct Patient, Correct Site, Correct Laterality, Correct Procedure, Correct Position, site marked, Risks and benefits discussed,  Surgical consent,  Pre-op evaluation,  At surgeon's request and post-op pain management  Laterality: Right  Prep: chloraprep       Needles:  Injection technique: Single-shot  Needle Type: Echogenic Needle     Needle Length: 10cm  Needle Gauge: 21     Additional Needles:   Narrative:  Start time: 12/12/2017 8:30 AM End time: 12/12/2017 8:34 AM Injection made incrementally with aspirations every 5 mL.  Performed by: Personally  Anesthesiologist: Audry Pili, MD  Additional Notes: No pain on injection. No increased resistance to injection. Injection made in 5cc increments. Good needle visualization. Patient tolerated the procedure well.

## 2017-12-12 NOTE — Progress Notes (Signed)
Danna Rn notified pt will be in 1316 in 20 minutes

## 2017-12-13 ENCOUNTER — Encounter (HOSPITAL_COMMUNITY): Payer: Self-pay | Admitting: Orthopedic Surgery

## 2017-12-13 LAB — BASIC METABOLIC PANEL
Anion gap: 11 (ref 5–15)
BUN: 14 mg/dL (ref 8–23)
CALCIUM: 8.7 mg/dL — AB (ref 8.9–10.3)
CO2: 25 mmol/L (ref 22–32)
CREATININE: 0.77 mg/dL (ref 0.44–1.00)
Chloride: 104 mmol/L (ref 98–111)
GFR calc non Af Amer: >60 mL/min (ref >60)
GLUCOSE: 142 mg/dL — AB (ref 70–99)
Potassium: 4.1 mmol/L (ref 3.5–5.1)
Sodium: 140 mmol/L (ref 135–145)

## 2017-12-13 LAB — CBC
HEMATOCRIT: 33.4 % — AB (ref 36.0–46.0)
Hemoglobin: 11 g/dL — ABNORMAL LOW (ref 12.0–15.0)
MCH: 30.8 pg (ref 26.0–34.0)
MCHC: 32.9 g/dL (ref 30.0–36.0)
MCV: 93.6 fL (ref 78.0–100.0)
Platelets: 209 10*3/uL (ref 150–400)
RBC: 3.57 MIL/uL — ABNORMAL LOW (ref 3.87–5.11)
RDW: 13.5 % (ref 11.5–15.5)
WBC: 10.8 10*3/uL — ABNORMAL HIGH (ref 4.0–10.5)

## 2017-12-13 MED ORDER — TRAMADOL HCL 50 MG PO TABS
50.0000 mg | ORAL_TABLET | Freq: Four times a day (QID) | ORAL | Status: DC | PRN
Start: 2017-12-13 — End: 2017-12-14

## 2017-12-13 NOTE — Progress Notes (Signed)
Pt ace wrap on right knee with old blood top of ace wrap. No fresh blood detected. RN will continue to monitor.

## 2017-12-13 NOTE — Progress Notes (Addendum)
   Subjective: 1 Day Post-Op Procedure(s) (LRB): RIGHT TOTAL KNEE ARTHROPLASTY (Right) Patient reports pain as mild.   Patient seen in rounds by Dr. Wynelle Link. Patient is well, and has had no acute complaints or problems other than pain in the right knee. Did well ambulating with therapy yesterday. Foley catheter removed this AM. Denies chest pain, SOB, or calf pain.  We will continue therapy today.   Objective: Vital signs in last 24 hours: Temp:  [97.4 F (36.3 C)-98 F (36.7 C)] 97.8 F (36.6 C) (08/13 0558) Pulse Rate:  [53-81] 75 (08/13 0558) Resp:  [12-19] 16 (08/13 0558) BP: (88-130)/(51-74) 130/62 (08/13 0558) SpO2:  [94 %-100 %] 96 % (08/13 0558)  Intake/Output from previous day:  Intake/Output Summary (Last 24 hours) at 12/13/2017 0738 Last data filed at 12/13/2017 0604 Gross per 24 hour  Intake 3461.28 ml  Output 3495 ml  Net -33.72 ml     Labs: Recent Labs    12/13/17 0515  HGB 11.0*   Recent Labs    12/13/17 0515  WBC 10.8*  RBC 3.57*  HCT 33.4*  PLT 209   Recent Labs    12/13/17 0515  NA 140  K 4.1  CL 104  CO2 25  BUN 14  CREATININE 0.77  GLUCOSE 142*  CALCIUM 8.7*   Exam: General - Patient is Alert and Oriented Extremity - Neurologically intact Neurovascular intact Sensation intact distally Dorsiflexion/Plantar flexion intact Dressing - dressing C/D/I Motor Function - intact, moving foot and toes well on exam.   Past Medical History:  Diagnosis Date  . Aortic stenosis, mild 01/08/2015  . Hx of cancer of lung 6/10   Removed June 2010  . Hyperlipidemia   . Hypertension   . Low back pain   . Morbid obesity ([place]) 01/08/2015  . Right foot injury    report she sustained a fracture to her right , present with ortho boot on affected extremity   . Vitamin D deficiency     Assessment/Plan: 1 Day Post-Op Procedure(s) (LRB): RIGHT TOTAL KNEE ARTHROPLASTY (Right) Principal Problem:   OA (osteoarthritis) of knee  Estimated body mass index  is 39.99 kg/m as calculated from the following:   Height as of this encounter: 5\' 4"  (1.626 m).   Weight as of this encounter: 105.7 kg. Advance diet Up with therapy  Anticipated LOS equal to or greater than 2 midnights due to - Age 76 and older with one or more of the following:  - Obesity  - Expected need for hospital services (PT, OT, Nursing) required for safe  discharge  - Anticipated need for postoperative skilled nursing care or inpatient rehab  - Active co-morbidities: None OR   - Unanticipated findings during/Post Surgery: None  - Patient is a high risk of re-admission due to: None    DVT Prophylaxis - Xarelto Weight bearing as tolerated. D/C O2 and pulse ox and try on room air. Hemovac pulled without difficulty, will continue working with therapy today.  Plan is to go Home after hospital stay. Possible discharge tomorrow with outpatient PT if progresses with therapy and meeting goals.   Theresa Duty, PA-C Orthopedic Surgery 12/13/2017, 7:38 AM

## 2017-12-13 NOTE — Progress Notes (Addendum)
Physical Therapy Treatment Patient Details Name: Elizabeth [name] MRN: [id number] DOB: May 16, 1950 Today's Date: 12/13/2017    History of Present Illness 67 yo female s/p R TKA 12/12/17. Hx of obesity, L ankle arthrodesis, lung ca    PT Comments    Progressing with mobility.    Follow Up Recommendations  Follow surgeon's recommendation for DC plan and follow-up therapies     Equipment Recommendations  Rolling Walker (pt has a rollator not a standard RW)    Recommendations for Other Services       Precautions / Restrictions Precautions Precautions: Fall Required Braces or Orthoses: Knee Immobilizer - Right Knee Immobilizer - Right: Discontinue once straight leg raise with < 10 degree lag Restrictions Weight Bearing Restrictions: No Other Position/Activity Restrictions: WBAT    Mobility  Bed Mobility               General bed mobility comments: oob in recliner  Transfers Overall transfer level: Needs assistance Equipment used: Rolling walker (2 wheeled) Transfers: Sit to/from Stand Sit to Stand: Min assist         General transfer comment: VCs safety, technique, hand/LE placement. Assist to rise, stabilize, control descent. LOB posteriorly x 1.  Ambulation/Gait Ambulation/Gait assistance: Min assist Gait Distance (Feet): 100 Feet Assistive device: Rolling walker (2 wheeled) Gait Pattern/deviations: Step-to pattern     General Gait Details: VCs safety, sequence, proper use of RW. Assist to stabilize throughout ambulation distance.    Stairs             Wheelchair Mobility    Modified Rankin (Stroke Patients Only)       Balance Overall balance assessment: Needs assistance         Standing balance support: Bilateral upper extremity supported Standing balance-Leahy Scale: Poor Standing balance comment: requiring RW and assistance currently                            Cognition Arousal/Alertness: Awake/alert Behavior During  Therapy: WFL for tasks assessed/performed Overall Cognitive Status: Within Functional Limits for tasks assessed                                        Exercises Total Joint Exercises Ankle Circles/Pumps: Both;10 reps;Seated Quad Sets: AROM;Both;10 reps;Seated Heel Slides: AAROM;Right;10 reps;Supine Hip ABduction/ADduction: AAROM;Right;10 reps;Seated;AROM Straight Leg Raises: AAROM;Right;10 reps;Supine;AROM Goniometric ROM: ~10-60 degrees    General Comments        Pertinent Vitals/Pain Pain Assessment: 0-10 Pain Score: 5  Pain Location: R knee Pain Descriptors / Indicators: Aching;Sore Pain Intervention(s): Monitored during session;Repositioned;Ice applied    Home Living                      Prior Function            PT Goals (current goals can now be found in the care plan section) Progress towards PT goals: Progressing toward goals    Frequency    7X/week      PT Plan Current plan remains appropriate    Co-evaluation              AM-PAC PT "6 Clicks" Daily Activity  Outcome Measure  Difficulty turning over in bed (including adjusting bedclothes, sheets and blankets)?: A Little Difficulty moving from lying on back to sitting on the side of the bed? : Unable Difficulty  sitting down on and standing up from a chair with arms (e.g., wheelchair, bedside commode, etc,.)?: Unable Help needed moving to and from a bed to chair (including a wheelchair)?: A Little Help needed walking in hospital room?: A Little Help needed climbing 3-5 steps with a railing? : A Little 6 Click Score: 14    End of Session Equipment Utilized During Treatment: Gait belt;Right knee immobilizer Activity Tolerance: Patient tolerated treatment well Patient left: in chair;with call bell/phone within reach;with chair alarm set   PT Visit Diagnosis: Pain;Muscle weakness (generalized) (M62.81);Other abnormalities of gait and mobility (R26.89) Pain - Right/Left:  Right Pain - part of body: Knee     Time: 0479-9872 PT Time Calculation (min) (ACUTE ONLY): 17 min  Charges:  $Gait Training: 8-22 mins                       Weston Anna, MPT Pager: [phone]

## 2017-12-13 NOTE — Progress Notes (Signed)
Physical Therapy Treatment Patient Details Name: Elizabeth [name] MRN: [id number] DOB: 1950/12/09 Today's Date: 12/13/2017    History of Present Illness 67 yo female s/p R TKA 12/12/17. Hx of obesity, L ankle arthrodesis, lung ca    PT Comments    Progressing with mobility.    Follow Up Recommendations  Follow surgeon's recommendation for DC plan and follow-up therapies     Equipment Recommendations  Rolling walker with 5" wheels(pt has a rollator NOT a standard RW)    Recommendations for Other Services       Precautions / Restrictions Precautions Precautions: Fall Required Braces or Orthoses: Knee Immobilizer - Right Knee Immobilizer - Right: Discontinue once straight leg raise with < 10 degree lag Restrictions Weight Bearing Restrictions: No Other Position/Activity Restrictions: WBAT    Mobility  Bed Mobility Overal bed mobility: Needs Assistance Bed Mobility: Sit to Supine       Sit to supine: Min assist   General bed mobility comments: Assist for R LE  Transfers Overall transfer level: Needs assistance Equipment used: Rolling walker (2 wheeled) Transfers: Sit to/from Stand Sit to Stand: Min assist         General transfer comment: VCs safety, technique, hand/LE placement. Assist to rise, stabilize, control descent.   Ambulation/Gait Ambulation/Gait assistance: Min guard Gait Distance (Feet): 120 Feet Assistive device: Rolling walker (2 wheeled) Gait Pattern/deviations: Step-to pattern;Step-through pattern;Decreased stride length     General Gait Details: VCs safety, sequence, proper use of RW. Assist to stabilize throughout ambulation distance.    Stairs             Wheelchair Mobility    Modified Rankin (Stroke Patients Only)       Balance Overall balance assessment: Needs assistance         Standing balance support: Bilateral upper extremity supported Standing balance-Leahy Scale: Poor Standing balance comment: requiring RW and  assistance currently                            Cognition Arousal/Alertness: Awake/alert Behavior During Therapy: WFL for tasks assessed/performed Overall Cognitive Status: Within Functional Limits for tasks assessed                                        Exercises      General Comments        Pertinent Vitals/Pain Pain Assessment: 0-10 Pain Score: 5  Pain Location: R knee Pain Descriptors / Indicators: Aching;Sore Pain Intervention(s): Monitored during session;Repositioned;Ice applied    Home Living                      Prior Function            PT Goals (current goals can now be found in the care plan section) Progress towards PT goals: Progressing toward goals    Frequency    7X/week      PT Plan Current plan remains appropriate    Co-evaluation              AM-PAC PT "6 Clicks" Daily Activity  Outcome Measure  Difficulty turning over in bed (including adjusting bedclothes, sheets and blankets)?: A Little Difficulty moving from lying on back to sitting on the side of the bed? : Unable Difficulty sitting down on and standing up from a chair with arms (e.g., wheelchair, bedside  commode, etc,.)?: Unable Help needed moving to and from a bed to chair (including a wheelchair)?: A Little Help needed walking in hospital room?: A Little Help needed climbing 3-5 steps with a railing? : A Little 6 Click Score: 14    End of Session Equipment Utilized During Treatment: Gait belt;Right knee immobilizer Activity Tolerance: Patient tolerated treatment well Patient left: in chair;with call bell/phone within reach;with chair alarm set   PT Visit Diagnosis: Pain;Muscle weakness (generalized) (M62.81);Other abnormalities of gait and mobility (R26.89) Pain - Right/Left: Right Pain - part of body: Knee     Time: 4370-0525 PT Time Calculation (min) (ACUTE ONLY): 12 min  Charges:  $Gait Training: 8-22 mins                         Weston Anna, MPT Pager: [phone]

## 2017-12-13 NOTE — Plan of Care (Signed)
Pt stable at time of assessment. No changes needed to care plans at this time.

## 2017-12-13 NOTE — Progress Notes (Signed)
Care Plan Notes 09/14/2017 to 12/13/2017       Care Plan by Leonides Grills A at 11/17/2017 11:05 AM    Date of Service   Author Author Type Status Note Type File Time  11/17/2017  Leonides Grills A  Signed Care Plan 11/17/2017             Ortho Bundle Scheduled R TKA on 12-12-17 DCP:  Home with grandson.  1 story/2 ste. DME:  No needs.  Has a RW and toilet seat elevator. PT:  [facility].  PT eval scheduled for 12-16-17.

## 2017-12-14 ENCOUNTER — Encounter (HOSPITAL_COMMUNITY): Payer: Self-pay

## 2017-12-14 LAB — CBC
HCT: 31.6 % — ABNORMAL LOW (ref 36.0–46.0)
Hemoglobin: 10.3 g/dL — ABNORMAL LOW (ref 12.0–15.0)
MCH: 30.8 pg (ref 26.0–34.0)
MCHC: 32.6 g/dL (ref 30.0–36.0)
MCV: 94.6 fL (ref 78.0–100.0)
Platelets: 241 10*3/uL (ref 150–400)
RBC: 3.34 MIL/uL — ABNORMAL LOW (ref 3.87–5.11)
RDW: 14 % (ref 11.5–15.5)
WBC: 11.5 10*3/uL — ABNORMAL HIGH (ref 4.0–10.5)

## 2017-12-14 LAB — BASIC METABOLIC PANEL
Anion gap: 7 (ref 5–15)
BUN: 16 mg/dL (ref 8–23)
CALCIUM: 8.6 mg/dL — AB (ref 8.9–10.3)
CO2: 29 mmol/L (ref 22–32)
CREATININE: 0.78 mg/dL (ref 0.44–1.00)
Chloride: 107 mmol/L (ref 98–111)
GFR calc Af Amer: >60 mL/min (ref >60)
GFR calc non Af Amer: >60 mL/min (ref >60)
Glucose, Bld: 121 mg/dL — ABNORMAL HIGH (ref 70–99)
Potassium: 4 mmol/L (ref 3.5–5.1)
SODIUM: 143 mmol/L (ref 135–145)

## 2017-12-14 MED ORDER — GABAPENTIN 300 MG PO CAPS: 300.0000 mg | ORAL_CAPSULE | Freq: Three times a day (TID) | ORAL | 0 refills | Status: DC

## 2017-12-14 MED ORDER — TRAMADOL HCL 50 MG PO TABS: 50.0000 mg | ORAL_TABLET | Freq: Four times a day (QID) | ORAL | 0 refills | Status: DC | PRN

## 2017-12-14 MED ORDER — METHOCARBAMOL 500 MG PO TABS: 500.0000 mg | ORAL_TABLET | Freq: Four times a day (QID) | ORAL | 0 refills | Status: DC | PRN

## 2017-12-14 MED ORDER — RIVAROXABAN 10 MG PO TABS: 10.0000 mg | ORAL_TABLET | Freq: Every day | ORAL | 0 refills | Status: DC

## 2017-12-14 MED ORDER — OXYCODONE HCL 5 MG PO TABS: 5.0000 mg | ORAL_TABLET | Freq: Four times a day (QID) | ORAL | 0 refills | Status: DC | PRN

## 2017-12-14 NOTE — Progress Notes (Signed)
Reviewed discharged instructions with patient. Patient verbalized understanding of all instructions.

## 2017-12-14 NOTE — Progress Notes (Addendum)
Physical Therapy Treatment Patient Details Name: Elizabeth [name] MRN: [id number] DOB: Sep 26, 1950 Today's Date: 12/14/2017    History of Present Illness 67 yo female s/p R TKA 12/12/17. Hx of obesity, L ankle arthrodesis, lung ca    PT Comments    Progressing with mobility. Reviewed exercises, gait training, and stair training. Issued HEP for pt to perform 2x/day until she begins OP PT. Reviewed use of KI as well. All education completed. Okay to d/c from PT standpoint-made RN aware.     Follow Up Recommendations  Follow surgeon's recommendation for DC plan and follow-up therapies     Equipment Recommendations  Rolling walker with 5" wheels    Recommendations for Other Services       Precautions / Restrictions Precautions Precautions: Fall Required Braces or Orthoses: Knee Immobilizer - Right Knee Immobilizer - Right: Discontinue once straight leg raise with < 10 degree lag Restrictions Weight Bearing Restrictions: No Other Position/Activity Restrictions: WBAT    Mobility  Bed Mobility Overal bed mobility: Needs Assistance Bed Mobility: Supine to Sit;Sit to Supine     Supine to sit: Min assist Sit to supine: Min assist   General bed mobility comments: Assist for R LE  Transfers Overall transfer level: Needs assistance Equipment used: Rolling walker (2 wheeled) Transfers: Sit to/from Stand Sit to Stand: Min assist         General transfer comment: VCs safety, technique, hand/LE placement. Assist to rise, stabilize, control descent. Pt preferred to pull up on walker despite cues from therapist.   Ambulation/Gait Ambulation/Gait assistance: Min guard Gait Distance (Feet): 60 Feet Assistive device: Rolling walker (2 wheeled) Gait Pattern/deviations: Step-to pattern;Antalgic     General Gait Details: VCs safety, sequence, proper use of RW. Close guard for safety. Slow gait speed.    Stairs Stairs: Yes Min Assist Stair Management: Step to pattern;Forwards;With  walker Number of Stairs: 3 General stair comments: up and over portable steps with RW. Assist to stabilize pt and walker. VCs safety, technique, sequence.    Wheelchair Mobility    Modified Rankin (Stroke Patients Only)       Balance                                            Cognition Arousal/Alertness: Awake/alert Behavior During Therapy: WFL for tasks assessed/performed Overall Cognitive Status: Within Functional Limits for tasks assessed                                        Exercises Total Joint Exercises Ankle Circles/Pumps: Both;10 reps;Supine Quad Sets: AROM;Both;10 reps;Supine Heel Slides: AAROM;Right;10 reps;Supine Hip ABduction/ADduction: AAROM;Right;10 reps;Supine Straight Leg Raises: AAROM;Right;10 reps;Supine Goniometric ROM: ~10-50 degrees (limited by pain)    General Comments        Pertinent Vitals/Pain Pain Assessment: 0-10 Pain Score: 7  Pain Location: R knee Pain Descriptors / Indicators: Aching;Sore Pain Intervention(s): Monitored during session;Repositioned;Ice applied    Home Living                      Prior Function            PT Goals (current goals can now be found in the care plan section) Progress towards PT goals: Progressing toward goals    Frequency    7X/week  PT Plan Current plan remains appropriate    Co-evaluation              AM-PAC PT "6 Clicks" Daily Activity  Outcome Measure  Difficulty turning over in bed (including adjusting bedclothes, sheets and blankets)?: A Lot Difficulty moving from lying on back to sitting on the side of the bed? : Unable Difficulty sitting down on and standing up from a chair with arms (e.g., wheelchair, bedside commode, etc,.)?: Unable Help needed moving to and from a bed to chair (including a wheelchair)?: A Little Help needed walking in hospital room?: A Little Help needed climbing 3-5 steps with a railing? : A Little 6  Click Score: 13    End of Session Equipment Utilized During Treatment: Gait belt;Right knee immobilizer Activity Tolerance: Patient tolerated treatment well Patient left: in bed;with call bell/phone within reach   PT Visit Diagnosis: Pain;Muscle weakness (generalized) (M62.81);Other abnormalities of gait and mobility (R26.89) Pain - Right/Left: Right Pain - part of body: Knee     Time: 0131-4388 PT Time Calculation (min) (ACUTE ONLY): 27 min  Charges:  $Gait Training: 8-22 mins $Therapeutic Exercise: 8-22 mins                        Weston Anna, MPT Pager: [phone]

## 2017-12-14 NOTE — Progress Notes (Signed)
   Subjective: 2 Days Post-Op Procedure(s) (LRB): RIGHT TOTAL KNEE ARTHROPLASTY (Right) Patient reports pain as mild.   Patient seen in rounds with Dr. Wynelle Link. Patient is well, and has had no acute complaints or problems other than soreness in the right knee. States she is ready to go home today. Denies chest pain or SOB. Voiding without difficulty and positive flatus.  Plan is to go Home after hospital stay.  Objective: Vital signs in last 24 hours: Temp:  [97.4 F (36.3 C)-98.1 F (36.7 C)] 98 F (36.7 C) (08/14 0005) Pulse Rate:  [59-93] 84 (08/14 0626) Resp:  [16-18] 16 (08/14 0626) BP: (98-128)/(55-64) 109/64 (08/14 0626) SpO2:  [96 %-100 %] 96 % (08/14 0626)  Intake/Output from previous day:  Intake/Output Summary (Last 24 hours) at 12/14/2017 0838 Last data filed at 12/14/2017 0631 Gross per 24 hour  Intake 1680 ml  Output 250 ml  Net 1430 ml     Labs: Recent Labs    12/13/17 0515 12/14/17 0531  HGB 11.0* 10.3*   Recent Labs    12/13/17 0515 12/14/17 0531  WBC 10.8* 11.5*  RBC 3.57* 3.34*  HCT 33.4* 31.6*  PLT 209 241   Recent Labs    12/13/17 0515 12/14/17 0531  NA 140 143  K 4.1 4.0  CL 104 107  CO2 25 29  BUN 14 16  CREATININE 0.77 0.78  GLUCOSE 142* 121*  CALCIUM 8.7* 8.6*    Exam: General - Patient is Alert and Oriented Extremity - Neurologically intact Neurovascular intact Sensation intact distally Dorsiflexion/Plantar flexion intact Dressing/Incision - clean, dry, no drainage Motor Function - intact, moving foot and toes well on exam.   Past Medical History:  Diagnosis Date  . Aortic stenosis, mild 01/08/2015  . Hx of cancer of lung 6/10   Removed June 2010  . Hyperlipidemia   . Hypertension   . Low back pain   . Morbid obesity ([place]) 01/08/2015  . Right foot injury    report she sustained a fracture to her right , present with ortho boot on affected extremity   . Vitamin D deficiency     Assessment/Plan: 2 Days Post-Op  Procedure(s) (LRB): RIGHT TOTAL KNEE ARTHROPLASTY (Right) Principal Problem:   OA (osteoarthritis) of knee  Estimated body mass index is 39.99 kg/m as calculated from the following:   Height as of this encounter: 5\' 4"  (1.626 m).   Weight as of this encounter: 105.7 kg. Up with therapy D/C IV fluids  DVT Prophylaxis - Xarelto Weight-bearing as tolerated  Plan for discharge to home today with outpatient physical therapy at [facility] beginning Friday. Follow-up in the office in 2 weeks with Dr. Wynelle Link.   Theresa Duty, PA-C Orthopedic Surgery 12/14/2017, 8:38 AM

## 2017-12-19 NOTE — Discharge Summary (Signed)
Physician Discharge Summary   Patient ID: Elizabeth [name] MRN: [id number] DOB/AGE: 1951-01-10 67 y.o.  Admit date: 12/12/2017 Discharge date: 12/14/2017  Primary Diagnosis: Osteoarthritis, right knee   Admission Diagnoses:  Past Medical History:  Diagnosis Date  . Aortic stenosis, mild 01/08/2015  . Hx of cancer of lung 6/10   Removed June 2010  . Hyperlipidemia   . Hypertension   . Low back pain   . Morbid obesity ([place]) 01/08/2015  . Right foot injury    report she sustained a fracture to her right , present with ortho boot on affected extremity   . Vitamin D deficiency    Discharge Diagnoses:   Principal Problem:   OA (osteoarthritis) of knee  Estimated body mass index is 39.99 kg/m as calculated from the following:   Height as of this encounter: '5\' 4"'  (1.626 m).   Weight as of this encounter: 105.7 kg.  Procedure:  Procedure(s) (LRB): RIGHT TOTAL KNEE ARTHROPLASTY (Right)   Consults: None  HPI: Elizabeth [name] is a 67 y.o. 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 She has constant pain, with activity and at rest and significant functional deficits with difficulties even with ADLs. She has had extensive non-op management including analgesics, injections of cortisone and viscosupplements, and home exercise program, but remains in significant pain with significant dysfunction.Radiographs show bone on bone arthritis medial and patellofemoral. She presents now for right Total Knee Arthroplasty.   Laboratory Data: Admission on 12/12/2017, Discharged on 12/14/2017  Component Date Value Ref Range Status  . WBC 12/13/2017 10.8* 4.0 - 10.5 K/uL Final  . RBC 12/13/2017 3.57* 3.87 - 5.11 MIL/uL Final  . Hemoglobin 12/13/2017 11.0* 12.0 - 15.0 g/dL Final  . HCT 12/13/2017 33.4* 36.0 - 46.0 % Final  . MCV 12/13/2017 93.6  78.0 - 100.0 fL Final  . MCH 12/13/2017 30.8  26.0 - 34.0 pg Final  . MCHC 12/13/2017 32.9  30.0 - 36.0  g/dL Final  . RDW 12/13/2017 13.5  11.5 - 15.5 % Final  . Platelets 12/13/2017 209  150 - 400 K/uL Final   Performed at [facility], [place] [address], [place], [place] [address]  . Sodium 12/13/2017 140  135 - 145 mmol/L Final  . Potassium 12/13/2017 4.1  3.5 - 5.1 mmol/L Final  . Chloride 12/13/2017 104  98 - 111 mmol/L Final  . CO2 12/13/2017 25  22 - 32 mmol/L Final  . Glucose, Bld 12/13/2017 142* 70 - 99 mg/dL Final  . BUN 12/13/2017 14  8 - 23 mg/dL Final  . Creatinine, Ser 12/13/2017 0.77  0.44 - 1.00 mg/dL Final  . Calcium 12/13/2017 8.7* 8.9 - 10.3 mg/dL Final  . GFR calc non Af Amer 12/13/2017 >60  >60 mL/min Final  . GFR calc Af Amer 12/13/2017 >60  >60 mL/min Final   Comment: (NOTE) The eGFR has been calculated using the CKD EPI equation. This calculation has not been validated in all clinical situations. eGFR's persistently <60 mL/min signify possible Chronic Kidney Disease.   Georgiann Hahn gap 12/13/2017 11  5 - 15 Final   Performed at [facility], [place] [address] [place], [place] [address]  . WBC 12/14/2017 11.5* 4.0 - 10.5 K/uL Final  . RBC 12/14/2017 3.34* 3.87 - 5.11 MIL/uL Final  . Hemoglobin 12/14/2017 10.3* 12.0 - 15.0 g/dL Final  . HCT 12/14/2017 31.6* 36.0 - 46.0 % Final  . MCV  12/14/2017 94.6  78.0 - 100.0 fL Final  . MCH 12/14/2017 30.8  26.0 - 34.0 pg Final  . MCHC 12/14/2017 32.6  30.0 - 36.0 g/dL Final  . RDW 12/14/2017 14.0  11.5 - 15.5 % Final  . Platelets 12/14/2017 241  150 - 400 K/uL Final   Performed at [facility], [place] [address], [place], [place] [address]  . Sodium 12/14/2017 143  135 - 145 mmol/L Final  . Potassium 12/14/2017 4.0  3.5 - 5.1 mmol/L Final  . Chloride 12/14/2017 107  98 - 111 mmol/L Final  . CO2 12/14/2017 29  22 - 32 mmol/L Final  . Glucose, Bld 12/14/2017 121* 70 - 99 mg/dL Final  . BUN 12/14/2017 16  8 - 23 mg/dL Final  . Creatinine, Ser 12/14/2017 0.78  0.44 - 1.00 mg/dL  Final  . Calcium 12/14/2017 8.6* 8.9 - 10.3 mg/dL Final  . GFR calc non Af Amer 12/14/2017 >60  >60 mL/min Final  . GFR calc Af Amer 12/14/2017 >60  >60 mL/min Final   Comment: (NOTE) The eGFR has been calculated using the CKD EPI equation. This calculation has not been validated in all clinical situations. eGFR's persistently <60 mL/min signify possible Chronic Kidney Disease.   Georgiann Hahn gap 12/14/2017 7  5 - 15 Final   Performed at [facility], [place] [address] [place], [place] [address]  Hospital Outpatient Visit on 12/09/2017  Component Date Value Ref Range Status  . MRSA, PCR 12/09/2017 NEGATIVE  NEGATIVE Final  . Staphylococcus aureus 12/09/2017 NEGATIVE  NEGATIVE Final   Comment: (NOTE) The Xpert SA Assay (FDA approved for NASAL specimens in patients 45 years of age and older), is one component of a comprehensive surveillance program. It is not intended to diagnose infection nor to guide or monitor treatment. Performed at [facility], [place] [address], [place], [place] [address]   . aPTT 12/09/2017 30  24 - 36 seconds Final   Performed at [facility], [place] [address] [place], [place] [address]  . WBC 12/09/2017 6.6  4.0 - 10.5 K/uL Final  . RBC 12/09/2017 4.12  3.87 - 5.11 MIL/uL Final  . Hemoglobin 12/09/2017 12.8  12.0 - 15.0 g/dL Final  . HCT 12/09/2017 38.6  36.0 - 46.0 % Final  . MCV 12/09/2017 93.7  78.0 - 100.0 fL Final  . MCH 12/09/2017 31.1  26.0 - 34.0 pg Final  . MCHC 12/09/2017 33.2  30.0 - 36.0 g/dL Final  . RDW 12/09/2017 13.6  11.5 - 15.5 % Final  . Platelets 12/09/2017 203  150 - 400 K/uL Final   Performed at [facility], [place] [address] [place], [place] [address]  . Sodium 12/09/2017 143  135 - 145 mmol/L Final  . Potassium 12/09/2017 3.5  3.5 - 5.1 mmol/L Final  . Chloride 12/09/2017 105  98 - 111 mmol/L Final  . CO2 12/09/2017 31  22 - 32 mmol/L Final  . Glucose, Bld  12/09/2017 95  70 - 99 mg/dL Final  . BUN 12/09/2017 19  8 - 23 mg/dL Final  . Creatinine, Ser 12/09/2017 0.78  0.44 - 1.00 mg/dL Final  . Calcium 12/09/2017 9.3  8.9 - 10.3 mg/dL Final  . Total Protein 12/09/2017 7.4  6.5 - 8.1 g/dL Final  . Albumin 12/09/2017 3.9  3.5 - 5.0 g/dL Final  . AST 12/09/2017 23  15 - 41 U/L Final  . ALT 12/09/2017 18  0 -  44 U/L Final  . Alkaline Phosphatase 12/09/2017 53  38 - 126 U/L Final  . Total Bilirubin 12/09/2017 0.6  0.3 - 1.2 mg/dL Final  . GFR calc non Af Amer 12/09/2017 >60  >60 mL/min Final  . GFR calc Af Amer 12/09/2017 >60  >60 mL/min Final   Comment: (NOTE) The eGFR has been calculated using the CKD EPI equation. This calculation has not been validated in all clinical situations. eGFR's persistently <60 mL/min signify possible Chronic Kidney Disease.   Georgiann Hahn gap 12/09/2017 7  5 - 15 Final   Performed at [facility], [place] [address], [place], [place] [address]  . Prothrombin Time 12/09/2017 12.5  11.4 - 15.2 seconds Final  . INR 12/09/2017 0.94   Final   Performed at [facility], [place] [address], [place], [place] [address]  . ABO/RH(D) 12/09/2017 O POS   Final  . Antibody Screen 12/09/2017 NEG   Final  . Sample Expiration 12/09/2017 12/15/2017   Final  . Extend sample reason 12/09/2017    Final                   Value:NO TRANSFUSIONS OR PREGNANCY IN THE PAST 3 MONTHS Performed at [facility], [place] [address] [place], [place] [address]   . ABO/RH(D) 12/09/2017    Final                   Value:O POS Performed at [facility], [place] [address], [place], [place] [address]   Hospital Outpatient Visit on 12/06/2017  Component Date Value Ref Range Status  . Color, Urine 12/06/2017 YELLOW  YELLOW Final  . APPearance 12/06/2017 CLEAR  CLEAR Final  . Specific Gravity, Urine 12/06/2017 1.018  1.005 - 1.030 Final  . pH 12/06/2017 5.0  5.0 - 8.0 Final  . Glucose, UA  12/06/2017 NEGATIVE  NEGATIVE mg/dL Final  . Hgb urine dipstick 12/06/2017 NEGATIVE  NEGATIVE Final  . Bilirubin Urine 12/06/2017 NEGATIVE  NEGATIVE Final  . Ketones, ur 12/06/2017 NEGATIVE  NEGATIVE mg/dL Final  . Protein, ur 12/06/2017 NEGATIVE  NEGATIVE mg/dL Final  . Nitrite 12/06/2017 NEGATIVE  NEGATIVE Final  . Leukocytes, UA 12/06/2017 NEGATIVE  NEGATIVE Final   Performed at [place] [address] [place], [place] [address]  Lab on 11/23/2017  Component Date Value Ref Range Status  . Color, UA 11/23/2017 Yellow   Final  . Clarity, UA 11/23/2017 Clear   Final  . Glucose, UA 11/23/2017 Negative  Negative Final  . Bilirubin, UA 11/23/2017 neg   Final  . Ketones, UA 11/23/2017 neg   Final  . Spec Grav, UA 11/23/2017 1.025  1.010 - 1.025 Final  . Blood, UA 11/23/2017 neg   Final  . pH, UA 11/23/2017 6.0  5.0 - 8.0 Final  . Protein, UA 11/23/2017 Negative  Negative Final  . Urobilinogen, UA 11/23/2017 0.2  0.2 or 1.0 E.U./dL Final  . Nitrite, UA 11/23/2017 neg   Final  . Leukocytes, UA 11/23/2017 Negative  Negative Final  . Hgb A1c MFr Bld 11/23/2017 5.5  4.6 - 6.5 % Final   Glycemic Control Guidelines for People with Diabetes:Non Diabetic:  <6%Goal of Therapy: <7%Additional Action Suggested:  >8%   . Albumin 11/23/2017 4.3  3.5 - 5.2 g/dL Final  . WBC 11/23/2017 15.8* 4.0 - 10.5 K/uL Final  . RBC 11/23/2017 4.42  3.87 - 5.11 Mil/uL Final  . Hemoglobin 11/23/2017 13.5  12.0 - 15.0 g/dL Final  . HCT 11/23/2017 42.0  36.0 - 46.0 % Final  . MCV 11/23/2017 95.1  78.0 - 100.0 fl Final  . MCHC 11/23/2017 32.1  30.0 - 36.0 g/dL Final  . RDW 11/23/2017 14.4  11.5 - 15.5 % Final  . Platelets 11/23/2017 224.0  150.0 - 400.0 K/uL Final  . Neutrophils Relative % 11/23/2017 59.3  43.0 - 77.0 % Final  . Lymphocytes Relative 11/23/2017 33.1  12.0 - 46.0 % Final  . Monocytes Relative 11/23/2017 6.4  3.0 - 12.0 % Final  . Eosinophils Relative 11/23/2017 1.0  0.0 - 5.0 %  Final  . Basophils Relative 11/23/2017 0.2  0.0 - 3.0 % Final  . Neutro Abs 11/23/2017 9.4* 1.4 - 7.7 K/uL Final  . Lymphs Abs 11/23/2017 5.2* 0.7 - 4.0 K/uL Final  . Monocytes Absolute 11/23/2017 1.0  0.1 - 1.0 K/uL Final  . Eosinophils Absolute 11/23/2017 0.2  0.0 - 0.7 K/uL Final  . Basophils Absolute 11/23/2017 0.0  0.0 - 0.1 K/uL Final  Office Visit on 11/11/2017  Component Date Value Ref Range Status  . Glucose, Bld 11/11/2017 88  65 - 99 mg/dL Final   Comment: .            Fasting reference interval .   . BUN 11/11/2017 13  7 - 25 mg/dL Final  . Creat 11/11/2017 0.83  0.50 - 0.99 mg/dL Final   Comment: For patients >91 years of age, the reference limit for Creatinine is approximately 13% higher for people identified as African-American. .   Havery Moros Ratio 75/17/0017 NOT APPLICABLE  6 - 22 (calc) Final  . Sodium 11/11/2017 141  135 - 146 mmol/L Final  . Potassium 11/11/2017 4.7  3.5 - 5.3 mmol/L Final  . Chloride 11/11/2017 101  98 - 110 mmol/L Final  . CO2 11/11/2017 28  20 - 32 mmol/L Final  . Calcium 11/11/2017 10.0  8.6 - 10.4 mg/dL Final  . Total Protein 11/11/2017 7.4  6.1 - 8.1 g/dL Final  . Albumin 11/11/2017 4.5  3.6 - 5.1 g/dL Final  . Globulin 11/11/2017 2.9  1.9 - 3.7 g/dL (calc) Final  . AG Ratio 11/11/2017 1.6  1.0 - 2.5 (calc) Final  . Total Bilirubin 11/11/2017 0.8  0.2 - 1.2 mg/dL Final  . Alkaline phosphatase (APISO) 11/11/2017 51  33 - 130 U/L Final  . AST 11/11/2017 16  10 - 35 U/L Final  . ALT 11/11/2017 11  6 - 29 U/L Final  . Cholesterol 11/11/2017 141  <200 mg/dL Final  . HDL 11/11/2017 35* >50 mg/dL Final  . Triglycerides 11/11/2017 114  <150 mg/dL Final  . LDL Cholesterol (Calc) 11/11/2017 85  mg/dL (calc) Final   Comment: Reference range: <100 . Desirable range <100 mg/dL for primary prevention;   <70 mg/dL for patients with CHD or diabetic patients  with > or = 2 CHD risk factors. Marland Kitchen LDL-C is now calculated using the Martin-Hopkins   calculation, which is a validated novel method providing  better accuracy than the Friedewald equation in the  estimation of LDL-C.  Cresenciano Genre et al. Annamaria Helling. 4944;967(59): 2061-2068  (http://education.[employer].com/faq/FAQ164)   . Total CHOL/HDL Ratio 11/11/2017 4.0  <5.0 (calc) Final  . Non-HDL Cholesterol (Calc) 11/11/2017 106  <130 mg/dL (calc) Final   Comment: For patients with diabetes plus 1 major ASCVD risk  factor, treating to a non-HDL-C goal of <100 mg/dL  (LDL-C of <70 mg/dL) is considered a therapeutic  option.      X-Rays:No results found.  EKG: Orders placed or performed in visit on 11/23/17  . EKG 12-Lead     Hospital Course: Elizabeth [name] is a 67 y.o. who was admitted to [facility]. They were brought to the operating room on 12/12/2017 and underwent Procedure(s): RIGHT TOTAL KNEE ARTHROPLASTY.  Patient tolerated the procedure well and was later transferred to the recovery room and then to the orthopaedic floor for postoperative care.  They were given PO and IV analgesics for pain control following their surgery.  They were given 24 hours of postoperative antibiotics of  Anti-infectives (From admission, onward)   Start     Dose/Rate Route Frequency Ordered Stop   12/12/17 2100  vancomycin (VANCOCIN) IVPB 1000 mg/200 mL premix     1,000 mg 200 mL/hr over 60 Minutes Intravenous Every 12 hours 12/12/17 1217 12/12/17 2150   12/12/17 0639  vancomycin (VANCOCIN) 1-5 GM/200ML-% IVPB    Note to Pharmacy:  Waldron Session   : cabinet override      12/12/17 0639 12/12/17 1844   12/12/17 0600  vancomycin (VANCOCIN) 1,500 mg in sodium chloride 0.9 % 500 mL IVPB     1,500 mg 250 mL/hr over 120 Minutes Intravenous On call to O.R. 12/11/17 1030 12/12/17 0933     and started on DVT prophylaxis in the form of Xarelto.   PT and OT were ordered for total joint protocol.  Discharge planning consulted to help with postop disposition and equipment needs.  Patient had a  good night on the evening of surgery. She started to get up OOB with therapy on POD #0. Hemovac drain was pulled without difficulty on POD #1. Continued to work with therapy into POD #2. Pt was seen during rounds on day 2 and was ready to go home pending progress with therapy. Dressing was changed and the incision was clean, dry, and intact with no drainage. Pt completed one additional session of PT and was meeting her goals. She was discharged to home later that day in stable condition.  Diet: Cardiac diet Activity:WBAT Follow-up :in 2 weeks with Dr. Wynelle Link Disposition - Home with outpatient physical therapy at [facility] Discharged Condition: stable   Discharge Instructions    Call MD / Call 911   Complete by:  As directed    If you experience chest pain or shortness of breath, CALL 911 and be transported to the hospital emergency room.  If you develope a fever above 101 F, pus (white drainage) or increased drainage or redness at the wound, or calf pain, call your surgeon's office.   Change dressing   Complete by:  As directed    Change the dressing daily with sterile 4 x 4 inch gauze dressing and apply TED hose.   Constipation Prevention   Complete by:  As directed    Drink plenty of fluids.  Prune juice may be helpful.  You may use a stool softener, such as Colace (over the counter) 100 mg twice a day.  Use MiraLax (over the counter) for constipation as needed.   Diet - low sodium heart healthy   Complete by:  As directed    Discharge instructions   Complete by:  As directed    Dr. Gaynelle Arabian Total Joint Specialist [facility] [address], [place], [place] [address] [phone]  TOTAL KNEE REPLACEMENT POSTOPERATIVE DIRECTIONS  Knee Rehabilitation, Guidelines Following Surgery  Results after knee surgery are often greatly improved when  you follow the exercise, range of motion and muscle strengthening exercises prescribed by your doctor. Safety measures are  also important to protect the knee from further injury. Any time any of these exercises cause you to have increased pain or swelling in your knee joint, decrease the amount until you are comfortable again and slowly increase them. If you have problems or questions, call your caregiver or physical therapist for advice.   HOME CARE INSTRUCTIONS  Remove items at home which could result in a fall. This includes throw rugs or furniture in walking pathways.  ICE to the affected knee every three hours for 30 minutes at a time and then as needed for pain and swelling.  Continue to use ice on the knee for pain and swelling from surgery. You may notice swelling that will progress down to the foot and ankle.  This is normal after surgery.  Elevate the leg when you are not up walking on it.   Continue to use the breathing machine which will help keep your temperature down.  It is common for your temperature to cycle up and down following surgery, especially at night when you are not up moving around and exerting yourself.  The breathing machine keeps your lungs expanded and your temperature down. Do not place pillow under knee, focus on keeping the knee straight while resting   DIET You may resume your previous home diet once your are discharged from the hospital.  DRESSING / WOUND CARE / SHOWERING You may shower 3 days after surgery, but keep the wounds dry during showering.  You may use an occlusive plastic wrap (Press'n Seal for example), NO SOAKING/SUBMERGING IN THE BATHTUB.  If the bandage gets wet, change with a clean dry gauze.  If the incision gets wet, pat the wound dry with a clean towel. You may start showering once you are discharged home but do not submerge the incision under water. Just pat the incision dry and apply a dry gauze dressing on daily. Change the surgical dressing daily and reapply a dry dressing each time.  ACTIVITY Walk with your walker as instructed. Use walker as long as suggested  by your caregivers. Avoid periods of inactivity such as sitting longer than an hour when not asleep. This helps prevent blood clots.  You may resume a sexual relationship in one month or when given the OK by your doctor.  You may return to work once you are cleared by your doctor.  Do not drive a car for 6 weeks or until released by you surgeon.  Do not drive while taking narcotics.  WEIGHT BEARING Weight bearing as tolerated with assist device (walker, cane, etc) as directed, use it as long as suggested by your surgeon or therapist, typically at least 4-6 weeks.  POSTOPERATIVE CONSTIPATION PROTOCOL Constipation - defined medically as fewer than three stools per week and severe constipation as less than one stool per week.  One of the most common issues patients have following surgery is constipation.  Even if you have a regular bowel pattern at home, your normal regimen is likely to be disrupted due to multiple reasons following surgery.  Combination of anesthesia, postoperative narcotics, change in appetite and fluid intake all can affect your bowels.  In order to avoid complications following surgery, here are some recommendations in order to help you during your recovery period.  Colace (docusate) - Pick up an over-the-counter form of Colace or another stool softener and take twice a day as long  as you are requiring postoperative pain medications.  Take with a full glass of water daily.  If you experience loose stools or diarrhea, hold the colace until you stool forms back up.  If your symptoms do not get better within 1 week or if they get worse, check with your doctor.  Dulcolax (bisacodyl) - Pick up over-the-counter and take as directed by the product packaging as needed to assist with the movement of your bowels.  Take with a full glass of water.  Use this product as needed if not relieved by Colace only.   MiraLax (polyethylene glycol) - Pick up over-the-counter to have on hand.  MiraLax  is a solution that will increase the amount of water in your bowels to assist with bowel movements.  Take as directed and can mix with a glass of water, juice, soda, coffee, or tea.  Take if you go more than two days without a movement. Do not use MiraLax more than once per day. Call your doctor if you are still constipated or irregular after using this medication for 7 days in a row.  If you continue to have problems with postoperative constipation, please contact the office for further assistance and recommendations.  If you experience "the worst abdominal pain ever" or develop nausea or vomiting, please contact the office immediatly for further recommendations for treatment.  ITCHING  If you experience itching with your medications, try taking only a single pain pill, or even half a pain pill at a time.  You can also use Benadryl over the counter for itching or also to help with sleep.   TED HOSE STOCKINGS Wear the elastic stockings on both legs for three weeks following surgery during the day but you may remove then at night for sleeping.  MEDICATIONS See your medication summary on the "After Visit Summary" that the nursing staff will review with you prior to discharge.  You may have some home medications which will be placed on hold until you complete the course of blood thinner medication.  It is important for you to complete the blood thinner medication as prescribed by your surgeon.  Continue your approved medications as instructed at time of discharge.  PRECAUTIONS If you experience chest pain or shortness of breath - call 911 immediately for transfer to the hospital emergency department.  If you develop a fever greater that 101 F, purulent drainage from wound, increased redness or drainage from wound, foul odor from the wound/dressing, or calf pain - CONTACT YOUR SURGEON.                                                   FOLLOW-UP APPOINTMENTS Make sure you keep all of your appointments  after your operation with your surgeon and caregivers. You should call the office at the above phone number and make an appointment for approximately two weeks after the date of your surgery or on the date instructed by your surgeon outlined in the "After Visit Summary".   RANGE OF MOTION AND STRENGTHENING EXERCISES  Rehabilitation of the knee is important following a knee injury or an operation. After just a few days of immobilization, the muscles of the thigh which control the knee become weakened and shrink (atrophy). Knee exercises are designed to build up the tone and strength of the thigh muscles and to improve knee  motion. Often times heat used for twenty to thirty minutes before working out will loosen up your tissues and help with improving the range of motion but do not use heat for the first two weeks following surgery. These exercises can be done on a training (exercise) mat, on the floor, on a table or on a bed. Use what ever works the best and is most comfortable for you Knee exercises include:  Leg Lifts - While your knee is still immobilized in a splint or cast, you can do straight leg raises. Lift the leg to 60 degrees, hold for 3 sec, and slowly lower the leg. Repeat 10-20 times 2-3 times daily. Perform this exercise against resistance later as your knee gets better.  Quad and Hamstring Sets - Tighten up the muscle on the front of the thigh (Quad) and hold for 5-10 sec. Repeat this 10-20 times hourly. Hamstring sets are done by pushing the foot backward against an object and holding for 5-10 sec. Repeat as with quad sets.  Leg Slides: Lying on your back, slowly slide your foot toward your buttocks, bending your knee up off the floor (only go as far as is comfortable). Then slowly slide your foot back down until your leg is flat on the floor again. Angel Wings: Lying on your back spread your legs to the side as far apart as you can without causing discomfort.  A rehabilitation program  following serious knee injuries can speed recovery and prevent re-injury in the future due to weakened muscles. Contact your doctor or a physical therapist for more information on knee rehabilitation.   IF YOU ARE TRANSFERRED TO A SKILLED REHAB FACILITY If the patient is transferred to a skilled rehab facility following release from the hospital, a list of the current medications will be sent to the facility for the patient to continue.  When discharged from the skilled rehab facility, please have the facility set up the patient's [place] prior to being released. Also, the skilled facility will be responsible for providing the patient with their medications at time of release from the facility to include their pain medication, the muscle relaxants, and their blood thinner medication. If the patient is still at the rehab facility at time of the two week follow up appointment, the skilled rehab facility will also need to assist the patient in arranging follow up appointment in our office and any transportation needs.  MAKE SURE YOU:  Understand these instructions.  Get help right away if you are not doing well or get worse.    Pick up stool softner and laxative for home use following surgery while on pain medications. Do not submerge incision under water. Please use good hand washing techniques while changing dressing each day. May shower starting three days after surgery. Please use a clean towel to pat the incision dry following showers. Continue to use ice for pain and swelling after surgery. Do not use any lotions or creams on the incision until instructed by your surgeon.   Do not put a pillow under the knee. Place it under the heel.   Complete by:  As directed    Driving restrictions   Complete by:  As directed    No driving for two weeks   TED hose   Complete by:  As directed    Use stockings (TED hose) for three weeks on both leg(s).  You may remove them at night  for sleeping.   Weight bearing as tolerated  Complete by:  As directed      Allergies as of 12/14/2017      Reactions   Penicillins Other (See Comments)   Fever Has patient had a PCN reaction causing immediate rash, facial/tongue/throat swelling, SOB or lightheadedness with hypotension: Unknown Has patient had a PCN reaction causing severe rash involving mucus membranes or skin necrosis: Unknown Has patient had a PCN reaction that required hospitalization: Unknown Has patient had a PCN reaction occurring within the last 10 years: No Childhood allergy If all of the above answers are "NO", then may proceed with Cephalosporin use.      Medication List    STOP taking these medications   aspirin EC 81 MG tablet   HYDROcodone-acetaminophen 5-325 MG tablet Commonly known as:  NORCO/VICODIN   LEG CRAMPS PO   MAGNESIUM PO   multivitamin with minerals Tabs tablet   Vitamin D3 5000 units Tabs     TAKE these medications   azithromycin 1 g powder Commonly known as:  ZITHROMAX Take 1 g by mouth once.   gabapentin 300 MG capsule Commonly known as:  NEURONTIN Take 1 capsule (300 mg total) by mouth 3 (three) times daily. Gabapentin 300 mg Protocol Take a 300 mg capsule three times a day for two weeks following surgery. Then take a 300 mg capsule two times a day for two weeks.  Then take a 300 mg capsule once a day for two weeks.  Then discontinue the Gabapentin.   methocarbamol 500 MG tablet Commonly known as:  ROBAXIN Take 1 tablet (500 mg total) by mouth every 6 (six) hours as needed for muscle spasms.   olmesartan-hydrochlorothiazide 20-12.5 MG tablet Commonly known as:  BENICAR HCT Take 1 tablet by mouth daily. What changed:    when to take this  additional instructions Notes to patient:  Resume home regimen   oxyCODONE 5 MG immediate release tablet Commonly known as:  Oxy IR/ROXICODONE Take 1-2 tablets (5-10 mg total) by mouth every 6 (six) hours as needed for  moderate pain (pain score 4-6). What changed:    how much to take  when to take this  reasons to take this  additional instructions   rivaroxaban 10 MG Tabs tablet Commonly known as:  XARELTO Take 1 tablet (10 mg total) by mouth daily with breakfast for 19 days. Then resume one 81 mg aspirin once a day   traMADol 50 MG tablet Commonly known as:  ULTRAM Take 1-2 tablets (50-100 mg total) by mouth every 6 (six) hours as needed for moderate pain (if oxycodone alone not sufficient).            Discharge Care Instructions  (From admission, onward)         Start     Ordered   12/14/17 0000  Weight bearing as tolerated     12/14/17 0844   12/14/17 0000  Change dressing    Comments:  Change the dressing daily with sterile 4 x 4 inch gauze dressing and apply TED hose.   12/14/17 0844         Follow-up Information    Gaynelle Arabian, MD. Schedule an appointment as soon as possible for a visit on 12/27/2017.   Specialty:  Orthopedic Surgery Contact information: [address] [place] [address] [phone]           Signed: Theresa Duty, PA-C Orthopaedic Surgery 12/19/2017, 7:18 AM

## 2017-12-20 DIAGNOSIS — M25561 Pain in right knee: Secondary | ICD-10-CM | POA: Diagnosis not present

## 2017-12-23 DIAGNOSIS — M25561 Pain in right knee: Secondary | ICD-10-CM | POA: Diagnosis not present

## 2017-12-26 DIAGNOSIS — M25561 Pain in right knee: Secondary | ICD-10-CM | POA: Diagnosis not present

## 2017-12-28 DIAGNOSIS — M25561 Pain in right knee: Secondary | ICD-10-CM | POA: Diagnosis not present

## 2017-12-30 DIAGNOSIS — M25561 Pain in right knee: Secondary | ICD-10-CM | POA: Diagnosis not present

## 2018-01-03 DIAGNOSIS — M25561 Pain in right knee: Secondary | ICD-10-CM | POA: Diagnosis not present

## 2018-01-05 DIAGNOSIS — M25561 Pain in right knee: Secondary | ICD-10-CM | POA: Diagnosis not present

## 2018-01-13 DIAGNOSIS — M25561 Pain in right knee: Secondary | ICD-10-CM | POA: Diagnosis not present

## 2018-01-17 DIAGNOSIS — M13861 Other specified arthritis, right knee: Secondary | ICD-10-CM | POA: Diagnosis not present

## 2018-01-18 DIAGNOSIS — M25561 Pain in right knee: Secondary | ICD-10-CM | POA: Diagnosis not present

## 2018-01-20 ENCOUNTER — Inpatient Hospital Stay: Payer: Medicare Other | Attending: Adult Health

## 2018-01-20 ENCOUNTER — Ambulatory Visit (HOSPITAL_COMMUNITY)
Admission: RE | Admit: 2018-01-20 | Discharge: 2018-01-20 | Disposition: A | Discharge status: Home / Self Care | Payer: Medicare Other | Source: Ambulatory Visit | Attending: Adult Health | Admitting: Adult Health

## 2018-01-20 DIAGNOSIS — R590 Localized enlarged lymph nodes: Secondary | ICD-10-CM | POA: Diagnosis not present

## 2018-01-20 DIAGNOSIS — Z85118 Personal history of other malignant neoplasm of bronchus and lung: Secondary | ICD-10-CM | POA: Insufficient documentation

## 2018-01-20 DIAGNOSIS — I2510 Atherosclerotic heart disease of native coronary artery without angina pectoris: Secondary | ICD-10-CM | POA: Diagnosis not present

## 2018-01-20 DIAGNOSIS — I700 Atherosclerosis of aorta: Secondary | ICD-10-CM | POA: Diagnosis not present

## 2018-01-20 DIAGNOSIS — C3490 Malignant neoplasm of unspecified part of unspecified bronchus or lung: Secondary | ICD-10-CM | POA: Diagnosis not present

## 2018-01-20 DIAGNOSIS — C3411 Malignant neoplasm of upper lobe, right bronchus or lung: Secondary | ICD-10-CM

## 2018-01-20 LAB — BASIC METABOLIC PANEL
ANION GAP: 8 (ref 5–15)
BUN: 12 mg/dL (ref 8–23)
CALCIUM: 9.5 mg/dL (ref 8.9–10.3)
CO2: 28 mmol/L (ref 22–32)
CREATININE: 0.93 mg/dL (ref 0.44–1.00)
Chloride: 107 mmol/L (ref 98–111)
GLUCOSE: 92 mg/dL (ref 70–99)
Potassium: 4 mmol/L (ref 3.5–5.1)
Sodium: 143 mmol/L (ref 135–145)

## 2018-01-23 DIAGNOSIS — M25561 Pain in right knee: Secondary | ICD-10-CM | POA: Diagnosis not present

## 2018-01-25 DIAGNOSIS — M25561 Pain in right knee: Secondary | ICD-10-CM | POA: Diagnosis not present

## 2018-01-26 ENCOUNTER — Encounter: Payer: Self-pay | Admitting: Family Medicine

## 2018-01-26 DIAGNOSIS — I10 Essential (primary) hypertension: Secondary | ICD-10-CM

## 2018-01-26 MED ORDER — OLMESARTAN MEDOXOMIL-HCTZ 20-12.5 MG PO TABS: 1.0000 | ORAL_TABLET | Freq: Every day | ORAL | 3 refills | Status: DC

## 2018-01-27 ENCOUNTER — Telehealth: Payer: Self-pay | Admitting: Adult Health

## 2018-01-27 ENCOUNTER — Inpatient Hospital Stay (HOSPITAL_BASED_OUTPATIENT_CLINIC_OR_DEPARTMENT_OTHER): Payer: Medicare Other | Admitting: Adult Health

## 2018-01-27 ENCOUNTER — Encounter: Payer: Self-pay | Admitting: Adult Health

## 2018-01-27 VITALS — BP 126/64 | HR 87 | Temp 97.9°F | Resp 18 | Ht 64.0 in | Wt 234.5 lb

## 2018-01-27 DIAGNOSIS — Z85118 Personal history of other malignant neoplasm of bronchus and lung: Secondary | ICD-10-CM | POA: Diagnosis not present

## 2018-01-27 DIAGNOSIS — C3411 Malignant neoplasm of upper lobe, right bronchus or lung: Secondary | ICD-10-CM

## 2018-01-27 NOTE — Telephone Encounter (Signed)
Per 9/27 los.  Made lab and LTS appt.  [facility] will call patient with appt for CT.  Mailed calendar with note that they will call with CT appointment.

## 2018-01-27 NOTE — Progress Notes (Signed)
CLINIC:  Survivorship  REASON FOR VISIT:  Long-term survivorship surveillance visit for patient with history of lung cancer.   BRIEF ONCOLOGIC HISTORY:  Stage IA (T1a, NX, MX) non-small cell lung cancer, squamous cell carcinoma diagnosed in May 2010. Status post right upper lobe superior segmentectomy under the care of Dr. Arlyce Dice on 10/18/2008.    INTERVAL HISTORY:  Ishanvi is doing well today.  She did undergo knee surgery this year in her right knee.  She is continuing to recover from this and she is undergoing PT.  She denies any new issues.  She notes she has lost weight which was intentnional for her surgery.     ADDITIONAL REVIEW OF SYSTEMS:  Review of Systems  Constitutional: Negative for appetite change, chills, fatigue, fever and unexpected weight change.  HENT:   Negative for hearing loss, lump/mass, sore throat and trouble swallowing.   Eyes: Negative for eye problems and icterus.  Respiratory: Negative for chest tightness, shortness of breath and wheezing.   Cardiovascular: Negative for chest pain, leg swelling and palpitations.  Gastrointestinal: Negative for abdominal distention, abdominal pain, constipation, diarrhea, nausea and vomiting.  Endocrine: Negative for hot flashes.  Musculoskeletal: Negative for arthralgias and myalgias.  Skin: Negative for itching and rash.  Neurological: Negative for dizziness, extremity weakness, headaches and numbness.  Hematological: Negative for adenopathy. Does not bruise/bleed easily.  Psychiatric/Behavioral: Negative for depression. The patient is not nervous/anxious.      PAST MEDICAL & SURGICAL HISTORY:  Past Medical History:  Diagnosis Date  . Aortic stenosis, mild 01/08/2015  . Hx of cancer of lung 6/10   Removed June 2010  . Hyperlipidemia   . Hypertension   . Low back pain   . Morbid obesity ([place]) 01/08/2015  . Right foot injury    report she sustained a fracture to her right , present with ortho boot on affected  extremity   . Vitamin D deficiency    Past Surgical History:  Procedure Laterality Date  . ANKLE FUSION Left   . CHOLECYSTECTOMY    . LUNG REMOVAL, PARTIAL    . TOTAL KNEE ARTHROPLASTY Right 12/12/2017   Procedure: RIGHT TOTAL KNEE ARTHROPLASTY;  Surgeon: Gaynelle Arabian, MD;  Location: [facility];  Service: Orthopedics;  Laterality: Right;  . TUBAL LIGATION         CURRENT MEDICATIONS:  Current Outpatient Medications on File Prior to Visit  Medication Sig Dispense Refill  . azithromycin (ZITHROMAX) 1 g powder Take 1 g by mouth once.    . gabapentin (NEURONTIN) 300 MG capsule Take 1 capsule (300 mg total) by mouth 3 (three) times daily. Gabapentin 300 mg Protocol Take a 300 mg capsule three times a day for two weeks following surgery. Then take a 300 mg capsule two times a day for two weeks.  Then take a 300 mg capsule once a day for two weeks.  Then discontinue the Gabapentin. 84 capsule 0  . methocarbamol (ROBAXIN) 500 MG tablet Take 1 tablet (500 mg total) by mouth every 6 (six) hours as needed for muscle spasms. 40 tablet 0  . olmesartan-hydrochlorothiazide (BENICAR HCT) 20-12.5 MG tablet Take 1 tablet by mouth daily. 30 tablet 3  . oxyCODONE (OXY IR/ROXICODONE) 5 MG immediate release tablet Take 1-2 tablets (5-10 mg total) by mouth every 6 (six) hours as needed for moderate pain (pain score 4-6). 56 tablet 0  . rivaroxaban (XARELTO) 10 MG TABS tablet Take 1 tablet (10 mg total) by mouth daily with breakfast for 19  days. Then resume one 81 mg aspirin once a day 30 tablet 0  . traMADol (ULTRAM) 50 MG tablet Take 1-2 tablets (50-100 mg total) by mouth every 6 (six) hours as needed for moderate pain (if oxycodone alone not sufficient). 40 tablet 0   No current facility-administered medications on file prior to visit.     ALLERGIES:  Allergies  Allergen Reactions  . Penicillins Other (See Comments)    Fever Has patient had a PCN reaction causing immediate rash, facial/tongue/throat  swelling, SOB or lightheadedness with hypotension: Unknown Has patient had a PCN reaction causing severe rash involving mucus membranes or skin necrosis: Unknown Has patient had a PCN reaction that required hospitalization: Unknown Has patient had a PCN reaction occurring within the last 10 years: No Childhood allergy If all of the above answers are "NO", then may proceed with Cephalosporin use.     PHYSICAL EXAM:  Vitals:   01/27/18 1107  BP: 126/64  Pulse: 87  Resp: 18  Temp: 97.9 F (36.6 C)  SpO2: 98%   Filed Weights   01/27/18 1107  Weight: 234 lb 8 oz (106.4 kg)    General: Well-nourished, well-appearing female in no acute distress.  Unaccompanied today. Exam limited patient couldn't get onto exam table due to recent knee surgery HEENT: Head is atraumatic and normocephalic.  Pupils equal and reactive to light. Conjunctivae clear without exudate.  Sclerae anicteric. Oral mucosa is pink and moist without lesions. Oropharynx is pink and moist, without lesions. Lymph: No cervical, supraclavicular, or supraclavicular lymphadenopathy noted on palpation.   Cardiovascular: Normal rate and rhythm. Respiratory: Clear to auscultation bilaterally. Chest expansion symmetric without accessory muscle use on inspiration or expiration.   GI: Abdomen soft and round. No tenderness to palpation. Bowel sounds normoactive in 4 quadrants. No hepatosplenomegaly.  GU: Deferred.   Neuro: No focal deficits. Steady gait.   Psych: Normal mood and affect for situation. Extremities: No edema, cyanosis, or clubbing.   Skin: Warm and dry.    DIAGNOSTIC IMAGING:      ASSESSMENT & PLAN:  Ms. Mcclenney is a pleasant 67 y.o. woman with history of stage IA NSCLC, squamous cell carcinoma, diagnosed in May, 2010, treated with right upper lobe segmentectomy alone.  Patient presents to survivorship clinic today for routine surveillance as a long-term cancer survivor.   1. History of lung cancer: Jaelynn is doing  well today.  She has no clinical or radiographic sign of lung cancer recurrence at this point. I noted on her scan that there is a new lung nodule noted.  Attention on follow up was recommended.  I reached out to Dr. Clovis Riley who read the CT scan and will addend his report to include recommended CT f/u in 6 months.  I ordered this and spent a good deal of time reviewing this with Ivin Booty.  I gave her a lot of reassurance today.  She will see me after the CT scan.    2. Health maintenance and cancer prevention: I reviewed with her healthy diet and exercise and gave her handouts about reducing her future cancer risk.  I recommended she continue with routine cancer screenings for skin cancer, colon cancer, and breast cancer.  She no longer meets criteria for cervical cancer screening.    Dispo:  -LTS f/u in 6 months -CT chest in 6 months  A total of (30) minutes of face-to-face time was spent with this patient with greater than 50% of that time in counseling and care-coordination.  Charlestine Massed, NP [place] [phone]

## 2018-01-30 DIAGNOSIS — M25561 Pain in right knee: Secondary | ICD-10-CM | POA: Diagnosis not present

## 2018-02-01 DIAGNOSIS — M25561 Pain in right knee: Secondary | ICD-10-CM | POA: Diagnosis not present

## 2018-02-07 DIAGNOSIS — M25561 Pain in right knee: Secondary | ICD-10-CM | POA: Diagnosis not present

## 2018-02-09 DIAGNOSIS — M25561 Pain in right knee: Secondary | ICD-10-CM | POA: Diagnosis not present

## 2018-02-14 DIAGNOSIS — M25561 Pain in right knee: Secondary | ICD-10-CM | POA: Diagnosis not present

## 2018-02-16 DIAGNOSIS — M25561 Pain in right knee: Secondary | ICD-10-CM | POA: Diagnosis not present

## 2018-02-17 ENCOUNTER — Other Ambulatory Visit: Payer: Self-pay | Admitting: Family Medicine

## 2018-02-17 ENCOUNTER — Encounter: Payer: Self-pay | Admitting: Family Medicine

## 2018-02-17 DIAGNOSIS — M25552 Pain in left hip: Secondary | ICD-10-CM

## 2018-02-21 DIAGNOSIS — M25561 Pain in right knee: Secondary | ICD-10-CM | POA: Diagnosis not present

## 2018-02-23 DIAGNOSIS — M25561 Pain in right knee: Secondary | ICD-10-CM | POA: Diagnosis not present

## 2018-02-24 ENCOUNTER — Other Ambulatory Visit: Payer: Self-pay | Admitting: Family Medicine

## 2018-02-24 DIAGNOSIS — Z1231 Encounter for screening mammogram for malignant neoplasm of breast: Secondary | ICD-10-CM

## 2018-03-02 ENCOUNTER — Ambulatory Visit (HOSPITAL_BASED_OUTPATIENT_CLINIC_OR_DEPARTMENT_OTHER)
Admission: RE | Admit: 2018-03-02 | Discharge: 2018-03-02 | Disposition: A | Discharge status: Home / Self Care | Payer: Medicare Other | Source: Ambulatory Visit | Attending: Family Medicine | Admitting: Family Medicine

## 2018-03-02 DIAGNOSIS — Z1231 Encounter for screening mammogram for malignant neoplasm of breast: Secondary | ICD-10-CM | POA: Diagnosis not present

## 2018-03-06 ENCOUNTER — Ambulatory Visit (INDEPENDENT_AMBULATORY_CARE_PROVIDER_SITE_OTHER): Payer: Medicare Other

## 2018-03-06 DIAGNOSIS — Z23 Encounter for immunization: Secondary | ICD-10-CM

## 2018-03-06 NOTE — Progress Notes (Signed)
Pt. here for high dose flu injection. Given in L deltoid IM, pt. Tolerated well.

## 2018-03-13 ENCOUNTER — Ambulatory Visit
Admission: RE | Admit: 2018-03-13 | Discharge: 2018-03-13 | Disposition: A | Discharge status: Home / Self Care | Payer: Medicare Other | Source: Ambulatory Visit | Attending: Family Medicine | Admitting: Family Medicine

## 2018-03-13 DIAGNOSIS — M25552 Pain in left hip: Secondary | ICD-10-CM

## 2018-03-13 DIAGNOSIS — M1612 Unilateral primary osteoarthritis, left hip: Secondary | ICD-10-CM | POA: Diagnosis not present

## 2018-03-13 MED ORDER — IOPAMIDOL (ISOVUE-M 200) INJECTION 41%
1.0000 mL | Freq: Once | INTRAMUSCULAR | Status: AC
  Administered 2018-03-13: 1 mL via INTRA_ARTICULAR

## 2018-03-13 MED ORDER — METHYLPREDNISOLONE ACETATE 40 MG/ML INJ SUSP (RADIOLOG
120.0000 mg | Freq: Once | INTRAMUSCULAR | Status: AC
  Administered 2018-03-13: 120 mg via INTRA_ARTICULAR

## 2018-03-27 ENCOUNTER — Telehealth: Payer: Self-pay | Admitting: Family Medicine

## 2018-03-27 NOTE — Telephone Encounter (Signed)
Patient called [facility] first and they directed her to our office

## 2018-03-27 NOTE — Telephone Encounter (Signed)
Scheduled patient to follow up tomorrow (11/26)

## 2018-03-27 NOTE — Telephone Encounter (Signed)
That's unusual.  We can see her in the office then to evaluate tomorrow or Wednesday if she can make it.

## 2018-03-27 NOTE — Telephone Encounter (Signed)
Please ask her to call [facility] to report this, see what the next steps are - they may need to see her back to examine her.

## 2018-03-27 NOTE — Telephone Encounter (Signed)
Patient calling regarding hip injection received on 11/11 at [facility]. She states 3 days after the injection she started experiencing worse pain than she had prior to injection. No swelling or redness but is having difficulty walking.

## 2018-03-28 ENCOUNTER — Encounter: Payer: Self-pay | Admitting: Family Medicine

## 2018-03-28 ENCOUNTER — Ambulatory Visit (INDEPENDENT_AMBULATORY_CARE_PROVIDER_SITE_OTHER): Payer: Medicare Other | Admitting: Family Medicine

## 2018-03-28 ENCOUNTER — Ambulatory Visit (HOSPITAL_BASED_OUTPATIENT_CLINIC_OR_DEPARTMENT_OTHER)
Admission: RE | Admit: 2018-03-28 | Discharge: 2018-03-28 | Disposition: A | Discharge status: Home / Self Care | Payer: Medicare Other | Source: Ambulatory Visit | Attending: Family Medicine | Admitting: Family Medicine

## 2018-03-28 VITALS — BP 112/77 | HR 86 | Temp 98.2°F | Ht 64.0 in | Wt 240.0 lb

## 2018-03-28 DIAGNOSIS — M25552 Pain in left hip: Secondary | ICD-10-CM | POA: Insufficient documentation

## 2018-03-28 DIAGNOSIS — M1612 Unilateral primary osteoarthritis, left hip: Secondary | ICD-10-CM | POA: Diagnosis not present

## 2018-03-28 MED ORDER — OXYCODONE-ACETAMINOPHEN 7.5-325 MG PO TABS: 1.0000 | ORAL_TABLET | ORAL | 0 refills | Status: DC | PRN

## 2018-03-28 MED ORDER — PREDNISONE 10 MG PO TABS: ORAL_TABLET | ORAL | 0 refills | Status: DC

## 2018-03-28 NOTE — Progress Notes (Signed)
PCP: Ann Held, DO  Subjective:   HPI: Patient is a 67 y.o. female here for left hip pain.  Patient returns with worsening anterior left hip to lateral left hip pain. She has known history of moderate to severe arthritis of this hip and has been getting intermittent intra-articular steroid injections. She states her last injection was on November 11 and a few days later she felt sore but felt like her pain was shrinking in area. Then she states pain seemed to get worse from that point and now she is having 5 out of 10 pain anterior to lateral left hip with sitting but up to 9 out of 10 with standing and walking. Difficult to get comfortable when lying down as well. She denies fevers or chills. No back pain, numbness or radiation past the knee. She is using a cane for ambulation. She took some old Tylenol and oxycodone which helped some.  Past Medical History:  Diagnosis Date  . Aortic stenosis, mild 01/08/2015  . Hx of cancer of lung 6/10   Removed June 2010  . Hyperlipidemia   . Hypertension   . Low back pain   . Morbid obesity ([place]) 01/08/2015  . Right foot injury    report she sustained a fracture to her right , present with ortho boot on affected extremity   . Vitamin D deficiency     Current Outpatient Medications on File Prior to Visit  Medication Sig Dispense Refill  . olmesartan-hydrochlorothiazide (BENICAR HCT) 20-12.5 MG tablet Take 1 tablet by mouth daily. 30 tablet 3  . rivaroxaban (XARELTO) 10 MG TABS tablet Take 1 tablet (10 mg total) by mouth daily with breakfast for 19 days. Then resume one 81 mg aspirin once a day 30 tablet 0   No current facility-administered medications on file prior to visit.     Past Surgical History:  Procedure Laterality Date  . ANKLE FUSION Left   . CHOLECYSTECTOMY    . LUNG REMOVAL, PARTIAL    . TOTAL KNEE ARTHROPLASTY Right 12/12/2017   Procedure: RIGHT TOTAL KNEE ARTHROPLASTY;  Surgeon: Gaynelle Arabian, MD;  Location: [facility];  Service: Orthopedics;  Laterality: Right;  . TUBAL LIGATION      Allergies  Allergen Reactions  . Penicillins Other (See Comments)    Fever Has patient had a PCN reaction causing immediate rash, facial/tongue/throat swelling, SOB or lightheadedness with hypotension: Unknown Has patient had a PCN reaction causing severe rash involving mucus membranes or skin necrosis: Unknown Has patient had a PCN reaction that required hospitalization: Unknown Has patient had a PCN reaction occurring within the last 10 years: No Childhood allergy If all of the above answers are "NO", then may proceed with Cephalosporin use.     Social History   Socioeconomic History  . Marital status: Divorced    Spouse name: Not on file  . Number of children: 1  . Years of education: Not on file  . Highest education level: Not on file  Occupational History  . Occupation: Software engineer: [employer]  Social Needs  . Financial resource strain: Not on file  . Food insecurity:    Worry: Not on file    Inability: Not on file  . Transportation needs:    Medical: Not on file    Non-medical: Not on file  Tobacco Use  . Smoking status: Former Smoker    Types: Cigarettes    Last attempt to quit: 02/01/2005  Years since quitting: 13.1  . Smokeless tobacco: Never Used  Substance and Sexual Activity  . Alcohol use: No    Alcohol/week: 0.0 standard drinks  . Drug use: No  . Sexual activity: Yes  Lifestyle  . Physical activity:    Days per week: Not on file    Minutes per session: Not on file  . Stress: Not on file  Relationships  . Social connections:    Talks on phone: Not on file    Gets together: Not on file    Attends religious service: Not on file    Active member of club or organization: Not on file    Attends meetings of clubs or organizations: Not on file    Relationship status: Not on file  . Intimate partner violence:    Fear of current or ex partner: Not on  file    Emotionally abused: Not on file    Physically abused: Not on file    Forced sexual activity: Not on file  Other Topics Concern  . Not on file  Social History Narrative   Exercise--  no    Family History  Problem Relation Age of Onset  . Hypertension Mother   . Stroke Mother   . Cancer Mother        lung  . Heart disease Father        cabg  . Cancer Brother        skin    BP 112/77   Pulse 86   Temp 98.2 F (36.8 C) (Oral)   Ht 5\' 4"  (1.626 m)   Wt 240 lb (108.9 kg)   BMI 41.20 kg/m   Review of Systems: See HPI above.     Objective:  Physical Exam:  Gen: NAD, comfortable in exam room  Back: No gross deformity, scoliosis. No TTP .  No midline or bony TTP. FROM without pain. Strength LEs 5/5 all muscle groups but pain with hip flexion.   1+ MSRs in patellar and achilles tendons, equal bilaterally. Negative SLRs. Sensation intact to light touch bilaterally.  Left hip: No deformity, redness, bruising. Markedly limited motion - only a few degrees, painful Minimal tenderness over greater trochanter.  No other tenderness. NVI distally. Positive logroll.   Assessment & Plan:  1.  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  Unfortunately I believe this is progressed to the point where injections are not providing the benefit they had previously.  There is no evidence of infection and she is afebrile today.  Brief muscular skeletal ultrasound of the hip showed mild effusion but no evidence of crystal formation to suggest gout or pseudogout.  We discussed options and she will try a steroid Dosepak with Percocet as needed.  She has an appoint with Dr. Reynaldo Minium next week for follow-up of her right knee replacement and I encouraged her to talk to him about this as well.  She will let me know how she is doing next Monday.

## 2018-03-28 NOTE — Patient Instructions (Signed)
Your pain is coming from the arthritis in your hip. Try the steroid dose pack - start this today. Percocet as needed for severe pain. Let me know how you're doing on Monday. I want you to discuss this with Dr. Wynelle Link as well. Hang in there - I hope you have a great thanksgiving!

## 2018-04-03 ENCOUNTER — Encounter: Payer: Self-pay | Admitting: Family Medicine

## 2018-04-03 ENCOUNTER — Other Ambulatory Visit: Payer: Self-pay

## 2018-04-03 ENCOUNTER — Encounter (HOSPITAL_COMMUNITY): Payer: Self-pay | Admitting: *Deleted

## 2018-04-03 ENCOUNTER — Emergency Department (HOSPITAL_COMMUNITY)
Admission: EM | Admit: 2018-04-03 | Discharge: 2018-04-04 | Disposition: A | Discharge status: Home / Self Care | Payer: Medicare Other | Attending: Emergency Medicine | Admitting: Emergency Medicine

## 2018-04-03 DIAGNOSIS — M1612 Unilateral primary osteoarthritis, left hip: Secondary | ICD-10-CM | POA: Insufficient documentation

## 2018-04-03 DIAGNOSIS — I10 Essential (primary) hypertension: Secondary | ICD-10-CM | POA: Diagnosis not present

## 2018-04-03 DIAGNOSIS — M25552 Pain in left hip: Secondary | ICD-10-CM | POA: Insufficient documentation

## 2018-04-03 DIAGNOSIS — Z87891 Personal history of nicotine dependence: Secondary | ICD-10-CM | POA: Insufficient documentation

## 2018-04-03 DIAGNOSIS — Z79899 Other long term (current) drug therapy: Secondary | ICD-10-CM | POA: Insufficient documentation

## 2018-04-03 MED ORDER — PREDNISONE 10 MG PO TABS: ORAL_TABLET | ORAL | 0 refills | Status: DC

## 2018-04-03 NOTE — ED Notes (Addendum)
Pt stated "@ 4:15 tylenol 1000 mg & 7.5 oxycodone.  At 4:45 a new starter pack of steroids starting with 6 pills."

## 2018-04-03 NOTE — ED Triage Notes (Signed)
Pt stated "I've had hip pain since 11-14.  Has been on steroids, today Ortho called to f/u & told me to come in if it was still hurting."  Pt c/o left hip pain.

## 2018-04-03 NOTE — Telephone Encounter (Signed)
Spoke with patient on the phone following 4:20pm patient email.  She rechecked her temperature - was 100.18F but she took ibuprofen and she had already picked up the prednisone, took today's dosage before I could cancel the prescription - advised her to not take any additional medication.  She denied dizziness, any other symptoms besides hip pain, low grade fever.  I advised she go to the emergency department for possible septic arthritis of her hip.  She verbalized understanding and states she has family present who can take her.  I will follow along in her chart, call her tomorrow to check on her.

## 2018-04-04 ENCOUNTER — Emergency Department (HOSPITAL_COMMUNITY): Payer: Medicare Other

## 2018-04-04 DIAGNOSIS — M25552 Pain in left hip: Secondary | ICD-10-CM | POA: Diagnosis not present

## 2018-04-04 DIAGNOSIS — Z96651 Presence of right artificial knee joint: Secondary | ICD-10-CM | POA: Diagnosis not present

## 2018-04-04 DIAGNOSIS — Z471 Aftercare following joint replacement surgery: Secondary | ICD-10-CM | POA: Diagnosis not present

## 2018-04-04 DIAGNOSIS — M1612 Unilateral primary osteoarthritis, left hip: Secondary | ICD-10-CM | POA: Diagnosis not present

## 2018-04-04 LAB — CBC WITH DIFFERENTIAL/PLATELET
ABS IMMATURE GRANULOCYTES: 0.07 10*3/uL (ref 0.00–0.07)
Basophils Absolute: 0 10*3/uL (ref 0.0–0.1)
Basophils Relative: 0 %
Eosinophils Absolute: 0 10*3/uL (ref 0.0–0.5)
Eosinophils Relative: 0 %
HCT: 32.6 % — ABNORMAL LOW (ref 36.0–46.0)
Hemoglobin: 10.5 g/dL — ABNORMAL LOW (ref 12.0–15.0)
Immature Granulocytes: 1 %
Lymphocytes Relative: 9 %
Lymphs Abs: 0.9 10*3/uL (ref 0.7–4.0)
MCH: 29.9 pg (ref 26.0–34.0)
MCHC: 32.2 g/dL (ref 30.0–36.0)
MCV: 92.9 fL (ref 80.0–100.0)
MONO ABS: 0.7 10*3/uL (ref 0.1–1.0)
Monocytes Relative: 7 %
NEUTROS ABS: 8.5 10*3/uL — AB (ref 1.7–7.7)
NEUTROS PCT: 83 %
PLATELETS: 136 10*3/uL — AB (ref 150–400)
RBC: 3.51 MIL/uL — ABNORMAL LOW (ref 3.87–5.11)
RDW: 14.2 % (ref 11.5–15.5)
WBC: 10.1 10*3/uL (ref 4.0–10.5)
nRBC: 0 % (ref 0.0–0.2)

## 2018-04-04 LAB — BASIC METABOLIC PANEL
Anion gap: 10 (ref 5–15)
BUN: 31 mg/dL — ABNORMAL HIGH (ref 8–23)
CO2: 29 mmol/L (ref 22–32)
Calcium: 8.5 mg/dL — ABNORMAL LOW (ref 8.9–10.3)
Chloride: 96 mmol/L — ABNORMAL LOW (ref 98–111)
Creatinine, Ser: 1.27 mg/dL — ABNORMAL HIGH (ref 0.44–1.00)
GFR calc Af Amer: 50 mL/min — ABNORMAL LOW (ref >60)
GFR, EST NON AFRICAN AMERICAN: 43 mL/min — AB (ref >60)
GLUCOSE: 162 mg/dL — AB (ref 70–99)
Potassium: 3.9 mmol/L (ref 3.5–5.1)
Sodium: 135 mmol/L (ref 135–145)

## 2018-04-04 LAB — C-REACTIVE PROTEIN: CRP: 20.6 mg/dL — ABNORMAL HIGH (ref <1.0)

## 2018-04-04 LAB — SEDIMENTATION RATE: Sed Rate: 62 mm/hr — ABNORMAL HIGH (ref 0–22)

## 2018-04-04 MED ORDER — HYDROCODONE-ACETAMINOPHEN 5-325 MG PO TABS: 1.0000 | ORAL_TABLET | ORAL | 0 refills | Status: DC | PRN

## 2018-04-04 MED ORDER — FENTANYL CITRATE (PF) 100 MCG/2ML IJ SOLN
100.0000 ug | Freq: Once | INTRAMUSCULAR | Status: AC
  Administered 2018-04-04: 100 ug via INTRAVENOUS
  Filled 2018-04-04: qty 2

## 2018-04-04 MED ORDER — GADOBUTROL 1 MMOL/ML IV SOLN
10.0000 mL | Freq: Once | INTRAVENOUS | Status: AC | PRN
  Administered 2018-04-04: 10 mL via INTRAVENOUS

## 2018-04-04 MED ORDER — ONDANSETRON HCL 4 MG/2ML IJ SOLN
4.0000 mg | Freq: Once | INTRAMUSCULAR | Status: AC
  Administered 2018-04-04: 4 mg via INTRAVENOUS
  Filled 2018-04-04: qty 2

## 2018-04-04 NOTE — ED Provider Notes (Signed)
[facility] DEPT Provider Note: [name], MD, FACEP  CSN: [id number] MRN: [id number] ARRIVAL: 04/03/18 at 1908 ROOM: [place]  Hip Pain (left)   HISTORY OF PRESENT ILLNESS  04/04/18 12:28 AM Elizabeth [name] is a 67 y.o. female who has had left hip pain for several years.  She has been getting periodic steroid injections most recently on 03/13/2018.  She has had an exacerbation of pain in that hip since 03/16/2018.  She rates her pain as a 2 out of 10 at rest but is high as a 10 out of 10 with movement or attempted weightbearing.  Yesterday she noticed a low-grade fever of 100.8.  She contacted Dr. Barbaraann Barthel who advised she be evaluated for a septic hip.  She does not know if she has had a fever before yesterday.  She denies chills.  She denies new injury to the hip.   Past Medical History:  Diagnosis Date  . Aortic stenosis, mild 01/08/2015  . Hx of cancer of lung 6/10   Removed June 2010  . Hyperlipidemia   . Hypertension   . Low back pain   . Morbid obesity ([place]) 01/08/2015  . Right foot injury    report she sustained a fracture to her right , present with ortho boot on affected extremity   . Vitamin D deficiency     Past Surgical History:  Procedure Laterality Date  . ANKLE FUSION Left   . CHOLECYSTECTOMY    . LUNG REMOVAL, PARTIAL    . TOTAL KNEE ARTHROPLASTY Right 12/12/2017   Procedure: RIGHT TOTAL KNEE ARTHROPLASTY;  Surgeon: Gaynelle Arabian, MD;  Location: [facility];  Service: Orthopedics;  Laterality: Right;  . TUBAL LIGATION      Family History  Problem Relation Age of Onset  . Hypertension Mother   . Stroke Mother   . Cancer Mother        lung  . Heart disease Father        cabg  . Cancer Brother        skin    Social History   Tobacco Use  . Smoking status: Former Smoker    Types: Cigarettes    Last attempt to quit: 02/01/2005    Years since quitting: 13.1  . Smokeless tobacco: Never Used  Substance Use Topics  . Alcohol use: No      Alcohol/week: 0.0 standard drinks  . Drug use: No    Prior to Admission medications   Medication Sig Start Date End Date Taking? Authorizing Provider  acetaminophen (TYLENOL) 500 MG tablet Take 1,000 mg by mouth every 6 (six) hours as needed for mild pain, moderate pain or headache.   Yes [provider]  olmesartan-hydrochlorothiazide (BENICAR HCT) 20-12.5 MG tablet Take 1 tablet by mouth daily. 01/26/18  Yes Roma Schanz R, DO  predniSONE (DELTASONE) 10 MG tablet 6 tabs po days 1-2, 5 tabs po days 3-4, 4 tabs po days 5-6, 3 tabs po days 7-8, 2 tabs po days 9-10, 1 tab po days 11-12 04/03/18  Yes Hudnall, Sharyn Lull, MD    Allergies Penicillins   REVIEW OF SYSTEMS  Negative except as noted here or in the History of Present Illness.   PHYSICAL EXAMINATION  Initial Vital Signs Blood pressure 139/82, pulse 62, temperature 97.7 F (36.5 C), resp. rate 18, height 5\' 4"  (1.626 m), weight 108.9 kg, SpO2 97 %.  Examination General: Well-developed, well-nourished female in no acute distress; appearance consistent with age  of record HENT: normocephalic; atraumatic Eyes: pupils equal, round and reactive to light; extraocular muscles intact Neck: supple Heart: regular rate and rhythm Lungs: clear to auscultation bilaterally Abdomen: soft; nondistended; nontender; bowel sounds present Extremities: No deformity; pain on passive movement of left hip; pulses normal Neurologic: Awake, alert and oriented; motor function intact in all extremities and symmetric; no facial droop Skin: Warm and dry Psychiatric: Normal mood and affect   RESULTS  Summary of this visit's results, reviewed by myself:   EKG Interpretation  Date/Time:    Ventricular Rate:    PR Interval:    QRS Duration:   QT Interval:    QTC Calculation:   R Axis:     Text Interpretation:        Laboratory Studies: Results for orders placed or performed during the hospital encounter of 04/03/18 (from the  past 24 hour(s))  CBC with Differential/Platelet     Status: Abnormal   Collection Time: 04/04/18  1:33 AM  Result Value Ref Range   WBC 10.1 4.0 - 10.5 K/uL   RBC 3.51 (L) 3.87 - 5.11 MIL/uL   Hemoglobin 10.5 (L) 12.0 - 15.0 g/dL   HCT 32.6 (L) 36.0 - 46.0 %   MCV 92.9 80.0 - 100.0 fL   MCH 29.9 26.0 - 34.0 pg   MCHC 32.2 30.0 - 36.0 g/dL   RDW 14.2 11.5 - 15.5 %   Platelets 136 (L) 150 - 400 K/uL   nRBC 0.0 0.0 - 0.2 %   Neutrophils Relative % 83 %   Neutro Abs 8.5 (H) 1.7 - 7.7 K/uL   Lymphocytes Relative 9 %   Lymphs Abs 0.9 0.7 - 4.0 K/uL   Monocytes Relative 7 %   Monocytes Absolute 0.7 0.1 - 1.0 K/uL   Eosinophils Relative 0 %   Eosinophils Absolute 0.0 0.0 - 0.5 K/uL   Basophils Relative 0 %   Basophils Absolute 0.0 0.0 - 0.1 K/uL   Immature Granulocytes 1 %   Abs Immature Granulocytes 0.07 0.00 - 0.07 K/uL  Basic metabolic panel     Status: Abnormal   Collection Time: 04/04/18  1:33 AM  Result Value Ref Range   Sodium 135 135 - 145 mmol/L   Potassium 3.9 3.5 - 5.1 mmol/L   Chloride 96 (L) 98 - 111 mmol/L   CO2 29 22 - 32 mmol/L   Glucose, Bld 162 (H) 70 - 99 mg/dL   BUN 31 (H) 8 - 23 mg/dL   Creatinine, Ser 1.27 (H) 0.44 - 1.00 mg/dL   Calcium 8.5 (L) 8.9 - 10.3 mg/dL   GFR calc non Af Amer 43 (L) >60 mL/min   GFR calc Af Amer 50 (L) >60 mL/min   Anion gap 10 5 - 15  C-reactive protein     Status: Abnormal   Collection Time: 04/04/18  1:33 AM  Result Value Ref Range   CRP 20.6 (H) <1.0 mg/dL  Sedimentation rate     Status: Abnormal   Collection Time: 04/04/18  1:33 AM  Result Value Ref Range   Sed Rate 62 (H) 0 - 22 mm/hr   Imaging Studies: Mr Hip Left W Wo Contrast  Result Date: 04/04/2018 CLINICAL DATA:  Severe left hip pain since 03/16/2018. EXAM: MRI OF THE LEFT HIP WITHOUT AND WITH CONTRAST TECHNIQUE: Multiplanar, multisequence MR imaging was performed both before and after administration of intravenous contrast. CONTRAST:  10 cc Gadavist COMPARISON:   Radiographs 1Dec 26, 202019 FINDINGS: Advanced degenerative changes involving the left  hip. This appears relatively stable when compared to multiple previous spot films from left hip injections. There is significant central joint space narrowing, subchondral cystic changes or erosions, osteophytic spurring and small joint effusion. No stress fracture or AVN. The right hip demonstrates similar but less significant findings. The pubic symphysis and SI joints are intact. Mild degenerative changes but no pelvic fractures or pelvic bone lesions. Degenerative changes are noted in the lower lumbar spine. After contrast administration there is moderate enhancing synovitis involving the left hip and probable enhancing erosions. Small joint effusion. No findings highly suspicious for septic arthritis. Un inflammatory/erosive arthropathy is possible such as gout. There are enlarged left operator and left external iliac lymph nodes noted along with small left inguinal lymph nodes. IMPRESSION: 1. Advanced degenerative changes involving the left hip as detailed above. I do not see any suspicious MR findings to suggest septic arthritis. An inflammatory/erosive arthropathy is possible such as rheumatoid arthritis or gout. 2. No stress fracture or AVN. 3. Similar but less significant findings involving the right hip. 4. Borderline enlarged operator and external iliac lymph nodes. Electronically Signed   By: Marijo Sanes M.D.   On: 04/04/2018 07:44    ED COURSE and MDM  Nursing notes and initial vitals signs, including pulse oximetry, reviewed.  Vitals:   04/03/18 2025 04/04/18 0012 04/04/18 0333 04/04/18 0740  BP: 112/68 139/82 (!) 106/40 (!) 104/41  Pulse: 77 62 73 86  Resp: 16 18 18 16   Temp: (!) 97.5 F (36.4 C) 97.7 F (36.5 C) 98.3 F (36.8 C) 98.9 F (37.2 C)  TempSrc: Oral  Oral Oral  SpO2: 95% 97% 96% 90%  Weight: 108.9 kg     Height: 5\' 4"  (1.626 m)      2:36 AM Patient CRP is elevated, sed rate pending.   Discussed with radiology who advises MRI of the hip with contrast to assess for septic hip.  MRI pending.   7:57 AM Patient advised of reassuring CT scan.  She has an appointment with Dr. Wynelle Link of [facility] this afternoon.  She was advised to discuss possible hip replacement with him.  Consultation with the [facility] state controlled substances database reveals the patient has received no opioid prescriptions in the past 2 years.   PROCEDURES    ED DIAGNOSES     ICD-10-CM   1. Left hip pain M25.552   2. Arthritis of left hip M16.12        Abdalla Naramore, Jenny Reichmann, MD 04/04/18 [id number]

## 2018-04-04 NOTE — ED Notes (Addendum)
Pt returned from MRI °

## 2018-04-04 NOTE — ED Notes (Signed)
00:12 departure condition information charted in wrong chart in error.

## 2018-04-04 NOTE — ED Notes (Signed)
Patient transported to MRI 

## 2018-04-08 ENCOUNTER — Encounter: Payer: Self-pay | Admitting: Family Medicine

## 2018-04-09 ENCOUNTER — Emergency Department (HOSPITAL_COMMUNITY): Payer: Medicare Other

## 2018-04-09 ENCOUNTER — Inpatient Hospital Stay (HOSPITAL_COMMUNITY)
Admission: EM | Admit: 2018-04-09 | Discharge: 2018-05-11 | DRG: 907 | Disposition: A | Discharge status: Skilled Nursing Facility | Payer: Medicare Other | Attending: Surgery | Admitting: Surgery

## 2018-04-09 ENCOUNTER — Other Ambulatory Visit: Payer: Self-pay

## 2018-04-09 ENCOUNTER — Encounter (HOSPITAL_COMMUNITY): Payer: Self-pay

## 2018-04-09 DIAGNOSIS — J9601 Acute respiratory failure with hypoxia: Secondary | ICD-10-CM | POA: Diagnosis not present

## 2018-04-09 DIAGNOSIS — R2681 Unsteadiness on feet: Secondary | ICD-10-CM | POA: Diagnosis not present

## 2018-04-09 DIAGNOSIS — D62 Acute posthemorrhagic anemia: Secondary | ICD-10-CM | POA: Diagnosis not present

## 2018-04-09 DIAGNOSIS — I10 Essential (primary) hypertension: Secondary | ICD-10-CM

## 2018-04-09 DIAGNOSIS — N179 Acute kidney failure, unspecified: Secondary | ICD-10-CM | POA: Diagnosis present

## 2018-04-09 DIAGNOSIS — I38 Endocarditis, valve unspecified: Secondary | ICD-10-CM | POA: Diagnosis not present

## 2018-04-09 DIAGNOSIS — J810 Acute pulmonary edema: Secondary | ICD-10-CM | POA: Diagnosis not present

## 2018-04-09 DIAGNOSIS — I9589 Other hypotension: Secondary | ICD-10-CM

## 2018-04-09 DIAGNOSIS — Z952 Presence of prosthetic heart valve: Secondary | ICD-10-CM | POA: Diagnosis not present

## 2018-04-09 DIAGNOSIS — R6520 Severe sepsis without septic shock: Secondary | ICD-10-CM | POA: Diagnosis not present

## 2018-04-09 DIAGNOSIS — R6521 Severe sepsis with septic shock: Secondary | ICD-10-CM | POA: Diagnosis present

## 2018-04-09 DIAGNOSIS — I110 Hypertensive heart disease with heart failure: Secondary | ICD-10-CM | POA: Diagnosis present

## 2018-04-09 DIAGNOSIS — D72829 Elevated white blood cell count, unspecified: Secondary | ICD-10-CM | POA: Diagnosis not present

## 2018-04-09 DIAGNOSIS — I483 Typical atrial flutter: Secondary | ICD-10-CM | POA: Diagnosis not present

## 2018-04-09 DIAGNOSIS — D638 Anemia in other chronic diseases classified elsewhere: Secondary | ICD-10-CM | POA: Diagnosis present

## 2018-04-09 DIAGNOSIS — R0682 Tachypnea, not elsewhere classified: Secondary | ICD-10-CM | POA: Diagnosis not present

## 2018-04-09 DIAGNOSIS — R509 Fever, unspecified: Secondary | ICD-10-CM | POA: Diagnosis not present

## 2018-04-09 DIAGNOSIS — A412 Sepsis due to unspecified staphylococcus: Secondary | ICD-10-CM | POA: Diagnosis present

## 2018-04-09 DIAGNOSIS — R0602 Shortness of breath: Secondary | ICD-10-CM | POA: Diagnosis not present

## 2018-04-09 DIAGNOSIS — Z9911 Dependence on respirator [ventilator] status: Secondary | ICD-10-CM | POA: Diagnosis not present

## 2018-04-09 DIAGNOSIS — B9561 Methicillin susceptible Staphylococcus aureus infection as the cause of diseases classified elsewhere: Secondary | ICD-10-CM | POA: Diagnosis not present

## 2018-04-09 DIAGNOSIS — I352 Nonrheumatic aortic (valve) stenosis with insufficiency: Secondary | ICD-10-CM

## 2018-04-09 DIAGNOSIS — B957 Other staphylococcus as the cause of diseases classified elsewhere: Secondary | ICD-10-CM | POA: Diagnosis present

## 2018-04-09 DIAGNOSIS — A498 Other bacterial infections of unspecified site: Secondary | ICD-10-CM | POA: Diagnosis not present

## 2018-04-09 DIAGNOSIS — I370 Nonrheumatic pulmonary valve stenosis: Secondary | ICD-10-CM | POA: Diagnosis not present

## 2018-04-09 DIAGNOSIS — I5033 Acute on chronic diastolic (congestive) heart failure: Secondary | ICD-10-CM | POA: Diagnosis not present

## 2018-04-09 DIAGNOSIS — Z978 Presence of other specified devices: Secondary | ICD-10-CM | POA: Diagnosis not present

## 2018-04-09 DIAGNOSIS — A419 Sepsis, unspecified organism: Secondary | ICD-10-CM | POA: Diagnosis not present

## 2018-04-09 DIAGNOSIS — J9600 Acute respiratory failure, unspecified whether with hypoxia or hypercapnia: Secondary | ICD-10-CM

## 2018-04-09 DIAGNOSIS — R570 Cardiogenic shock: Secondary | ICD-10-CM | POA: Diagnosis not present

## 2018-04-09 DIAGNOSIS — M9689 Other intraoperative and postprocedural complications and disorders of the musculoskeletal system: Secondary | ICD-10-CM | POA: Diagnosis present

## 2018-04-09 DIAGNOSIS — Z8674 Personal history of sudden cardiac arrest: Secondary | ICD-10-CM | POA: Diagnosis not present

## 2018-04-09 DIAGNOSIS — M6281 Muscle weakness (generalized): Secondary | ICD-10-CM | POA: Diagnosis not present

## 2018-04-09 DIAGNOSIS — Y798 Miscellaneous orthopedic devices associated with adverse incidents, not elsewhere classified: Secondary | ICD-10-CM | POA: Diagnosis present

## 2018-04-09 DIAGNOSIS — Z4659 Encounter for fitting and adjustment of other gastrointestinal appliance and device: Secondary | ICD-10-CM

## 2018-04-09 DIAGNOSIS — R601 Generalized edema: Secondary | ICD-10-CM | POA: Diagnosis not present

## 2018-04-09 DIAGNOSIS — I469 Cardiac arrest, cause unspecified: Secondary | ICD-10-CM

## 2018-04-09 DIAGNOSIS — Z452 Encounter for adjustment and management of vascular access device: Secondary | ICD-10-CM | POA: Diagnosis not present

## 2018-04-09 DIAGNOSIS — E559 Vitamin D deficiency, unspecified: Secondary | ICD-10-CM | POA: Diagnosis present

## 2018-04-09 DIAGNOSIS — Y838 Other surgical procedures as the cause of abnormal reaction of the patient, or of later complication, without mention of misadventure at the time of the procedure: Secondary | ICD-10-CM | POA: Diagnosis present

## 2018-04-09 DIAGNOSIS — R011 Cardiac murmur, unspecified: Secondary | ICD-10-CM | POA: Diagnosis not present

## 2018-04-09 DIAGNOSIS — Z23 Encounter for immunization: Secondary | ICD-10-CM | POA: Diagnosis not present

## 2018-04-09 DIAGNOSIS — N170 Acute kidney failure with tubular necrosis: Secondary | ICD-10-CM | POA: Diagnosis not present

## 2018-04-09 DIAGNOSIS — R0603 Acute respiratory distress: Secondary | ICD-10-CM | POA: Diagnosis not present

## 2018-04-09 DIAGNOSIS — E875 Hyperkalemia: Secondary | ICD-10-CM | POA: Diagnosis not present

## 2018-04-09 DIAGNOSIS — L8990 Pressure ulcer of unspecified site, unspecified stage: Secondary | ICD-10-CM

## 2018-04-09 DIAGNOSIS — I4892 Unspecified atrial flutter: Secondary | ICD-10-CM | POA: Diagnosis not present

## 2018-04-09 DIAGNOSIS — Z85118 Personal history of other malignant neoplasm of bronchus and lung: Secondary | ICD-10-CM

## 2018-04-09 DIAGNOSIS — R262 Difficulty in walking, not elsewhere classified: Secondary | ICD-10-CM | POA: Diagnosis not present

## 2018-04-09 DIAGNOSIS — M25552 Pain in left hip: Secondary | ICD-10-CM

## 2018-04-09 DIAGNOSIS — R404 Transient alteration of awareness: Secondary | ICD-10-CM | POA: Diagnosis not present

## 2018-04-09 DIAGNOSIS — M009 Pyogenic arthritis, unspecified: Secondary | ICD-10-CM | POA: Diagnosis not present

## 2018-04-09 DIAGNOSIS — Z9049 Acquired absence of other specified parts of digestive tract: Secondary | ICD-10-CM

## 2018-04-09 DIAGNOSIS — Z96651 Presence of right artificial knee joint: Secondary | ICD-10-CM | POA: Diagnosis not present

## 2018-04-09 DIAGNOSIS — I330 Acute and subacute infective endocarditis: Secondary | ICD-10-CM | POA: Diagnosis present

## 2018-04-09 DIAGNOSIS — D696 Thrombocytopenia, unspecified: Secondary | ICD-10-CM

## 2018-04-09 DIAGNOSIS — J811 Chronic pulmonary edema: Secondary | ICD-10-CM

## 2018-04-09 DIAGNOSIS — Z6841 Body Mass Index (BMI) 40.0 and over, adult: Secondary | ICD-10-CM

## 2018-04-09 DIAGNOSIS — Z823 Family history of stroke: Secondary | ICD-10-CM

## 2018-04-09 DIAGNOSIS — R0902 Hypoxemia: Secondary | ICD-10-CM | POA: Diagnosis not present

## 2018-04-09 DIAGNOSIS — L89152 Pressure ulcer of sacral region, stage 2: Secondary | ICD-10-CM | POA: Diagnosis not present

## 2018-04-09 DIAGNOSIS — M545 Low back pain: Secondary | ICD-10-CM | POA: Diagnosis not present

## 2018-04-09 DIAGNOSIS — B9689 Other specified bacterial agents as the cause of diseases classified elsewhere: Secondary | ICD-10-CM | POA: Diagnosis not present

## 2018-04-09 DIAGNOSIS — I361 Nonrheumatic tricuspid (valve) insufficiency: Secondary | ICD-10-CM | POA: Diagnosis not present

## 2018-04-09 DIAGNOSIS — Z801 Family history of malignant neoplasm of trachea, bronchus and lung: Secondary | ICD-10-CM

## 2018-04-09 DIAGNOSIS — I471 Supraventricular tachycardia: Secondary | ICD-10-CM | POA: Diagnosis not present

## 2018-04-09 DIAGNOSIS — Z87891 Personal history of nicotine dependence: Secondary | ICD-10-CM | POA: Diagnosis not present

## 2018-04-09 DIAGNOSIS — Z808 Family history of malignant neoplasm of other organs or systems: Secondary | ICD-10-CM

## 2018-04-09 DIAGNOSIS — Z8249 Family history of ischemic heart disease and other diseases of the circulatory system: Secondary | ICD-10-CM

## 2018-04-09 DIAGNOSIS — M00052 Staphylococcal arthritis, left hip: Secondary | ICD-10-CM | POA: Diagnosis not present

## 2018-04-09 DIAGNOSIS — Z7902 Long term (current) use of antithrombotics/antiplatelets: Secondary | ICD-10-CM | POA: Diagnosis not present

## 2018-04-09 DIAGNOSIS — R7881 Bacteremia: Secondary | ICD-10-CM | POA: Diagnosis not present

## 2018-04-09 DIAGNOSIS — M1711 Unilateral primary osteoarthritis, right knee: Secondary | ICD-10-CM | POA: Diagnosis not present

## 2018-04-09 DIAGNOSIS — I39 Endocarditis and heart valve disorders in diseases classified elsewhere: Secondary | ICD-10-CM | POA: Diagnosis not present

## 2018-04-09 DIAGNOSIS — R41841 Cognitive communication deficit: Secondary | ICD-10-CM | POA: Diagnosis not present

## 2018-04-09 DIAGNOSIS — I339 Acute and subacute endocarditis, unspecified: Secondary | ICD-10-CM | POA: Diagnosis not present

## 2018-04-09 DIAGNOSIS — Z95828 Presence of other vascular implants and grafts: Secondary | ICD-10-CM | POA: Diagnosis not present

## 2018-04-09 DIAGNOSIS — I358 Other nonrheumatic aortic valve disorders: Secondary | ICD-10-CM | POA: Diagnosis not present

## 2018-04-09 DIAGNOSIS — I2510 Atherosclerotic heart disease of native coronary artery without angina pectoris: Secondary | ICD-10-CM | POA: Diagnosis present

## 2018-04-09 DIAGNOSIS — E2740 Unspecified adrenocortical insufficiency: Secondary | ICD-10-CM | POA: Diagnosis not present

## 2018-04-09 DIAGNOSIS — R109 Unspecified abdominal pain: Secondary | ICD-10-CM

## 2018-04-09 DIAGNOSIS — B958 Unspecified staphylococcus as the cause of diseases classified elsewhere: Secondary | ICD-10-CM | POA: Diagnosis not present

## 2018-04-09 DIAGNOSIS — I080 Rheumatic disorders of both mitral and aortic valves: Secondary | ICD-10-CM | POA: Diagnosis not present

## 2018-04-09 DIAGNOSIS — L89301 Pressure ulcer of unspecified buttock, stage 1: Secondary | ICD-10-CM | POA: Diagnosis not present

## 2018-04-09 DIAGNOSIS — E785 Hyperlipidemia, unspecified: Secondary | ICD-10-CM | POA: Diagnosis present

## 2018-04-09 DIAGNOSIS — Z9289 Personal history of other medical treatment: Secondary | ICD-10-CM

## 2018-04-09 DIAGNOSIS — J9690 Respiratory failure, unspecified, unspecified whether with hypoxia or hypercapnia: Secondary | ICD-10-CM | POA: Diagnosis not present

## 2018-04-09 DIAGNOSIS — R0689 Other abnormalities of breathing: Secondary | ICD-10-CM | POA: Diagnosis not present

## 2018-04-09 DIAGNOSIS — I509 Heart failure, unspecified: Secondary | ICD-10-CM | POA: Diagnosis not present

## 2018-04-09 DIAGNOSIS — I959 Hypotension, unspecified: Secondary | ICD-10-CM | POA: Diagnosis not present

## 2018-04-09 DIAGNOSIS — E873 Alkalosis: Secondary | ICD-10-CM | POA: Diagnosis not present

## 2018-04-09 DIAGNOSIS — I340 Nonrheumatic mitral (valve) insufficiency: Secondary | ICD-10-CM | POA: Diagnosis not present

## 2018-04-09 DIAGNOSIS — Z4682 Encounter for fitting and adjustment of non-vascular catheter: Secondary | ICD-10-CM | POA: Diagnosis not present

## 2018-04-09 DIAGNOSIS — Z7982 Long term (current) use of aspirin: Secondary | ICD-10-CM

## 2018-04-09 DIAGNOSIS — I442 Atrioventricular block, complete: Secondary | ICD-10-CM | POA: Diagnosis not present

## 2018-04-09 DIAGNOSIS — K5900 Constipation, unspecified: Secondary | ICD-10-CM | POA: Diagnosis not present

## 2018-04-09 DIAGNOSIS — I459 Conduction disorder, unspecified: Secondary | ICD-10-CM | POA: Diagnosis not present

## 2018-04-09 DIAGNOSIS — Z9889 Other specified postprocedural states: Secondary | ICD-10-CM | POA: Diagnosis not present

## 2018-04-09 DIAGNOSIS — R0600 Dyspnea, unspecified: Secondary | ICD-10-CM | POA: Diagnosis not present

## 2018-04-09 DIAGNOSIS — M160 Bilateral primary osteoarthritis of hip: Secondary | ICD-10-CM | POA: Diagnosis present

## 2018-04-09 DIAGNOSIS — Z7401 Bed confinement status: Secondary | ICD-10-CM | POA: Diagnosis not present

## 2018-04-09 DIAGNOSIS — I498 Other specified cardiac arrhythmias: Secondary | ICD-10-CM | POA: Diagnosis not present

## 2018-04-09 DIAGNOSIS — R0789 Other chest pain: Secondary | ICD-10-CM

## 2018-04-09 DIAGNOSIS — I480 Paroxysmal atrial fibrillation: Secondary | ICD-10-CM | POA: Diagnosis not present

## 2018-04-09 DIAGNOSIS — J90 Pleural effusion, not elsewhere classified: Secondary | ICD-10-CM | POA: Diagnosis not present

## 2018-04-09 DIAGNOSIS — I700 Atherosclerosis of aorta: Secondary | ICD-10-CM | POA: Diagnosis not present

## 2018-04-09 DIAGNOSIS — M01X0 Direct infection of unspecified joint in infectious and parasitic diseases classified elsewhere: Secondary | ICD-10-CM | POA: Diagnosis not present

## 2018-04-09 DIAGNOSIS — Z880 Allergy status to penicillin: Secondary | ICD-10-CM

## 2018-04-09 DIAGNOSIS — Z981 Arthrodesis status: Secondary | ICD-10-CM

## 2018-04-09 DIAGNOSIS — E876 Hypokalemia: Secondary | ICD-10-CM | POA: Diagnosis not present

## 2018-04-09 DIAGNOSIS — R7989 Other specified abnormal findings of blood chemistry: Secondary | ICD-10-CM | POA: Diagnosis not present

## 2018-04-09 DIAGNOSIS — R000 Tachycardia, unspecified: Secondary | ICD-10-CM | POA: Diagnosis not present

## 2018-04-09 DIAGNOSIS — K567 Ileus, unspecified: Secondary | ICD-10-CM | POA: Diagnosis not present

## 2018-04-09 DIAGNOSIS — I351 Nonrheumatic aortic (valve) insufficiency: Secondary | ICD-10-CM

## 2018-04-09 DIAGNOSIS — I083 Combined rheumatic disorders of mitral, aortic and tricuspid valves: Secondary | ICD-10-CM | POA: Diagnosis not present

## 2018-04-09 DIAGNOSIS — I350 Nonrheumatic aortic (valve) stenosis: Secondary | ICD-10-CM | POA: Diagnosis not present

## 2018-04-09 DIAGNOSIS — R1030 Lower abdominal pain, unspecified: Secondary | ICD-10-CM | POA: Diagnosis not present

## 2018-04-09 DIAGNOSIS — I499 Cardiac arrhythmia, unspecified: Secondary | ICD-10-CM | POA: Diagnosis not present

## 2018-04-09 HISTORY — DX: Cardiac arrhythmia, unspecified: I49.9

## 2018-04-09 HISTORY — DX: Dyspnea, unspecified: R06.00

## 2018-04-09 LAB — URINALYSIS, ROUTINE W REFLEX MICROSCOPIC
Bilirubin Urine: NEGATIVE
GLUCOSE, UA: NEGATIVE mg/dL
Ketones, ur: NEGATIVE mg/dL
Leukocytes, UA: NEGATIVE
Nitrite: NEGATIVE
Protein, ur: NEGATIVE mg/dL
Specific Gravity, Urine: 1.015 (ref 1.005–1.030)
pH: 5 (ref 5.0–8.0)

## 2018-04-09 LAB — CBC WITH DIFFERENTIAL/PLATELET
Abs Immature Granulocytes: 0.2 10*3/uL — ABNORMAL HIGH (ref 0.00–0.07)
BASOS PCT: 0 %
Basophils Absolute: 0 10*3/uL (ref 0.0–0.1)
Eosinophils Absolute: 0 10*3/uL (ref 0.0–0.5)
Eosinophils Relative: 0 %
HCT: 30 % — ABNORMAL LOW (ref 36.0–46.0)
Hemoglobin: 9.7 g/dL — ABNORMAL LOW (ref 12.0–15.0)
Immature Granulocytes: 1 %
Lymphocytes Relative: 6 %
Lymphs Abs: 1 10*3/uL (ref 0.7–4.0)
MCH: 29.8 pg (ref 26.0–34.0)
MCHC: 32.3 g/dL (ref 30.0–36.0)
MCV: 92 fL (ref 80.0–100.0)
Monocytes Absolute: 0.6 10*3/uL (ref 0.1–1.0)
Monocytes Relative: 4 %
Neutro Abs: 13.5 10*3/uL — ABNORMAL HIGH (ref 1.7–7.7)
Neutrophils Relative %: 89 %
Platelets: 83 10*3/uL — ABNORMAL LOW (ref 150–400)
RBC: 3.26 MIL/uL — ABNORMAL LOW (ref 3.87–5.11)
RDW: 14.6 % (ref 11.5–15.5)
WBC: 15.4 10*3/uL — ABNORMAL HIGH (ref 4.0–10.5)
nRBC: 0 % (ref 0.0–0.2)

## 2018-04-09 LAB — CBC
HEMATOCRIT: 28 % — AB (ref 36.0–46.0)
Hemoglobin: 9 g/dL — ABNORMAL LOW (ref 12.0–15.0)
MCH: 29.6 pg (ref 26.0–34.0)
MCHC: 32.1 g/dL (ref 30.0–36.0)
MCV: 92.1 fL (ref 80.0–100.0)
Platelets: 65 10*3/uL — ABNORMAL LOW (ref 150–400)
RBC: 3.04 MIL/uL — ABNORMAL LOW (ref 3.87–5.11)
RDW: 14.8 % (ref 11.5–15.5)
WBC: 16.3 10*3/uL — ABNORMAL HIGH (ref 4.0–10.5)
nRBC: 0 % (ref 0.0–0.2)

## 2018-04-09 LAB — CREATININE, SERUM
Creatinine, Ser: 1.54 mg/dL — ABNORMAL HIGH (ref 0.44–1.00)
GFR calc Af Amer: 40 mL/min — ABNORMAL LOW (ref >60)
GFR calc non Af Amer: 34 mL/min — ABNORMAL LOW (ref >60)

## 2018-04-09 LAB — COMPREHENSIVE METABOLIC PANEL
ALBUMIN: 2.7 g/dL — AB (ref 3.5–5.0)
ALT: 25 U/L (ref 0–44)
ANION GAP: 12 (ref 5–15)
AST: 33 U/L (ref 15–41)
Alkaline Phosphatase: 72 U/L (ref 38–126)
BUN: 49 mg/dL — ABNORMAL HIGH (ref 8–23)
CO2: 25 mmol/L (ref 22–32)
Calcium: 7.9 mg/dL — ABNORMAL LOW (ref 8.9–10.3)
Chloride: 99 mmol/L (ref 98–111)
Creatinine, Ser: 1.4 mg/dL — ABNORMAL HIGH (ref 0.44–1.00)
GFR calc Af Amer: 45 mL/min — ABNORMAL LOW (ref >60)
GFR calc non Af Amer: 39 mL/min — ABNORMAL LOW (ref >60)
GLUCOSE: 104 mg/dL — AB (ref 70–99)
Potassium: 3.5 mmol/L (ref 3.5–5.1)
Sodium: 136 mmol/L (ref 135–145)
Total Bilirubin: 1.3 mg/dL — ABNORMAL HIGH (ref 0.3–1.2)
Total Protein: 6.1 g/dL — ABNORMAL LOW (ref 6.5–8.1)

## 2018-04-09 LAB — C-REACTIVE PROTEIN: CRP: 17.7 mg/dL — ABNORMAL HIGH (ref <1.0)

## 2018-04-09 LAB — INFLUENZA PANEL BY PCR (TYPE A & B)
Influenza A By PCR: NEGATIVE
Influenza B By PCR: NEGATIVE

## 2018-04-09 LAB — I-STAT CG4 LACTIC ACID, ED
Lactic Acid, Venous: 1.84 mmol/L (ref 0.5–1.9)
Lactic Acid, Venous: 1.3 mmol/L (ref 0.5–1.9)

## 2018-04-09 LAB — SEDIMENTATION RATE: Sed Rate: 53 mm/hr — ABNORMAL HIGH (ref 0–22)

## 2018-04-09 MED ORDER — PNEUMOCOCCAL VAC POLYVALENT 25 MCG/0.5ML IJ INJ
0.5000 mL | INJECTION | INTRAMUSCULAR | Status: AC
  Administered 2018-04-10: 0.5 mL via INTRAMUSCULAR
  Filled 2018-04-09: qty 0.5

## 2018-04-09 MED ORDER — ENOXAPARIN SODIUM 40 MG/0.4ML ~~LOC~~ SOLN
40.0000 mg | SUBCUTANEOUS | Status: DC
  Administered 2018-04-09 – 2018-04-18 (×10): 40 mg via SUBCUTANEOUS
  Filled 2018-04-09 (×10): qty 0.4

## 2018-04-09 MED ORDER — VANCOMYCIN HCL 500 MG IV SOLR
500.0000 mg | Freq: Once | INTRAVENOUS | Status: AC
  Administered 2018-04-09: 500 mg via INTRAVENOUS
  Filled 2018-04-09: qty 500

## 2018-04-09 MED ORDER — VANCOMYCIN HCL IN DEXTROSE 1-5 GM/200ML-% IV SOLN: 1000.0000 mg | INTRAVENOUS | Status: DC

## 2018-04-09 MED ORDER — METRONIDAZOLE IN NACL 5-0.79 MG/ML-% IV SOLN
500.0000 mg | Freq: Three times a day (TID) | INTRAVENOUS | Status: DC
  Administered 2018-04-09 – 2018-04-10 (×3): 500 mg via INTRAVENOUS
  Filled 2018-04-09 (×3): qty 100

## 2018-04-09 MED ORDER — MORPHINE SULFATE (PF) 4 MG/ML IV SOLN
4.0000 mg | Freq: Once | INTRAVENOUS | Status: AC
  Administered 2018-04-09: 4 mg via INTRAVENOUS
  Filled 2018-04-09: qty 1

## 2018-04-09 MED ORDER — SODIUM CHLORIDE 0.9 % IV SOLN
2.0000 g | Freq: Two times a day (BID) | INTRAVENOUS | Status: DC
  Administered 2018-04-10: 2 g via INTRAVENOUS
  Filled 2018-04-09: qty 2

## 2018-04-09 MED ORDER — VANCOMYCIN HCL IN DEXTROSE 1-5 GM/200ML-% IV SOLN
1000.0000 mg | Freq: Once | INTRAVENOUS | Status: AC
  Administered 2018-04-09: 1000 mg via INTRAVENOUS
  Filled 2018-04-09: qty 200

## 2018-04-09 MED ORDER — ACETAMINOPHEN 325 MG PO TABS
650.0000 mg | ORAL_TABLET | Freq: Once | ORAL | Status: AC
  Administered 2018-04-09: 650 mg via ORAL
  Filled 2018-04-09: qty 2

## 2018-04-09 MED ORDER — LACTATED RINGERS IV BOLUS
1000.0000 mL | Freq: Once | INTRAVENOUS | Status: AC
  Administered 2018-04-09: 1000 mL via INTRAVENOUS

## 2018-04-09 MED ORDER — SODIUM CHLORIDE 0.9 % IV BOLUS
1000.0000 mL | Freq: Once | INTRAVENOUS | Status: AC
  Administered 2018-04-09: 1000 mL via INTRAVENOUS

## 2018-04-09 MED ORDER — ACETAMINOPHEN 325 MG PO TABS
650.0000 mg | ORAL_TABLET | ORAL | Status: DC | PRN
  Administered 2018-04-10 – 2018-04-20 (×2): 650 mg via ORAL
  Filled 2018-04-09 (×3): qty 2

## 2018-04-09 MED ORDER — HYDROCODONE-ACETAMINOPHEN 5-325 MG PO TABS
1.0000 | ORAL_TABLET | ORAL | Status: DC | PRN
  Administered 2018-04-11 – 2018-04-17 (×20): 1 via ORAL
  Filled 2018-04-09 (×23): qty 1

## 2018-04-09 MED ORDER — SODIUM CHLORIDE 0.9 % IV SOLN
2.0000 g | Freq: Once | INTRAVENOUS | Status: AC
  Administered 2018-04-09: 2 g via INTRAVENOUS
  Filled 2018-04-09: qty 2

## 2018-04-09 MED ORDER — ONDANSETRON HCL 4 MG/2ML IJ SOLN
4.0000 mg | Freq: Once | INTRAMUSCULAR | Status: AC
  Administered 2018-04-09: 4 mg via INTRAVENOUS
  Filled 2018-04-09: qty 2

## 2018-04-09 MED ORDER — LACTATED RINGERS IV SOLN
INTRAVENOUS | Status: DC
  Administered 2018-04-09 – 2018-04-13 (×6): via INTRAVENOUS

## 2018-04-09 NOTE — ED Notes (Signed)
Pure wick in place 

## 2018-04-09 NOTE — Progress Notes (Signed)
A consult was received from an ED physician for vancomycin and cefepime per pharmacy dosing.  The patient's profile has been reviewed for ht/wt/allergies/indication/available labs.   A one time order has been placed for cefepime 2 g iv once and vancomycin 1000 mg iv once per MD.  Further antibiotics/pharmacy consults should be ordered by admitting physician if indicated.                       Thank you, Napoleon Form 04/09/2018  5:56 PM

## 2018-04-09 NOTE — Progress Notes (Signed)
Pharmacy Antibiotic Note  Elizabeth [name] is a 67 y.o. female admitted on 04/09/2018 with fever of unspecified cause (possible septic arthritis with steroid hip injection) and acute kidney injury.  Pharmacy has been consulted for vancomycin and cefepime dosing.  Plan: Cefepime 2 g iv q 12 hours  Vancomycin 1000 mg  500 mg iv load followed by 1000 mg iv q 36 hours. AUC 420 IBW/ABW.   F/U renal function, culture results, plans for antibiotics      Temp (24hrs), Avg:100.8 F (38.2 C), Min:98.4 F (36.9 C), Max:102.6 F (39.2 C)  Recent Labs  Lab 04/04/18 0133 04/09/18 1600 04/09/18 1618 04/09/18 1801 04/09/18 2030  WBC 10.1 15.4*  --   --   --   CREATININE 1.27*  --   --  1.40*  --   LATICACIDVEN  --   --  1.84  --  1.30    Estimated Creatinine Clearance: 46.4 mL/min (A) (by C-G formula based on SCr of 1.4 mg/dL (H)).    Allergies  Allergen Reactions  . Penicillins Other (See Comments)    Fever Has patient had a PCN reaction causing immediate rash, facial/tongue/throat swelling, SOB or lightheadedness with hypotension: Unknown Has patient had a PCN reaction causing severe rash involving mucus membranes or skin necrosis: Unknown Has patient had a PCN reaction that required hospitalization: Unknown Has patient had a PCN reaction occurring within the last 10 years: No Childhood allergy If all of the above answers are "NO", then may proceed with Cephalosporin use.     Antimicrobials this admission: 12/8 cefepime >>  12/8 vancomycin >>  12/8 metronidazole >.  Dose adjustments this admission:   Microbiology results: 12/8 BCx:  Joint aspirate:   Thank you for allowing pharmacy to be a part of this patient's care.  Elizabeth [name] 04/09/2018 9:01 PM

## 2018-04-09 NOTE — ED Notes (Signed)
Bed: RW43 Expected date:  Expected time:  Means of arrival:  Comments: EMS-post op issue

## 2018-04-09 NOTE — ED Provider Notes (Addendum)
Elizabeth [name]   CSN: [id number] Arrival date & time: 04/09/18  1506     History   Chief Complaint Chief Complaint  Patient presents with  . Fever  . Knee Pain  . Hip Pain    HPI Elizabeth [name] is a 67 y.o. female.  HPI Patient presents with fever.  Left hip pain.  Fevers up to 105 at home.  Has left hip pain and was seen in the ER around 6 days ago for thought of infected left hip.  Has had injections recently for that.  Had MRI that showed some abnormalities but doubt infection.  Followed with Dr. Juanda Bond and was started on steroids.  Now has increasing fevers.  Continues have pain in left groin.  Has had some mild urinary frequency.  No cough.  No sore throat.  States she does have a little bit of pain in the left groin where they did the injections.  Mild right hip and right knee pain also.  Previous right knee replacement. Past Medical History:  Diagnosis Date  . Aortic stenosis, mild 01/08/2015  . Hx of cancer of lung 6/10   Removed June 2010  . Hyperlipidemia   . Hypertension   . Low back pain   . Morbid obesity ([place]) 01/08/2015  . Right foot injury    report she sustained a fracture to her right , present with ortho boot on affected extremity   . Vitamin D deficiency     Patient Active Problem List   Diagnosis Date Noted  . OA (osteoarthritis) of knee 12/12/2017  . Idiopathic chronic venous hypertension of both lower extremities with inflammation 06/29/2016  . Subtalar joint instability, left 06/29/2016  . Arthritis of right knee 01/30/2016  . Lower extremity edema 10/21/2015  . Leg cramps 08/21/2015  . Left ankle pain 04/09/2015  . Anxiety 01/20/2015  . Aortic stenosis, mild 01/08/2015  . Morbid obesity ([place]) 01/08/2015  . Varicose veins 01/02/2015  . Snoring 12/06/2014  . Viral gastroenteritis 04/06/2013  . Insect bite of ankle, right, infected 07/31/2012  . Primary cancer of right upper lobe of lung ([place])  10/18/2011  . Hip pain 06/29/2011  . Pain in joint, lower leg 03/17/2009  . GASTROENTERITIS 07/11/2008  . PULMONARY NODULE, SOLITARY 04/18/2008  . LIVER FUNCTION TESTS, ABNORMAL, HX OF 04/01/2008  . B12 DEFICIENCY 11/13/2007  . COUGH 11/13/2007  . UNSPECIFIED VITAMIN D DEFICIENCY 11/07/2007  . TINEA PEDIS 07/07/2007  . LYMPHADENOPATHY 11/16/2006  . Hyperlipidemia 09/15/2006  . Essential hypertension 09/15/2006  . LOW BACK PAIN 09/15/2006    Past Surgical History:  Procedure Laterality Date  . ANKLE FUSION Left   . CHOLECYSTECTOMY    . LUNG REMOVAL, PARTIAL    . TOTAL KNEE ARTHROPLASTY Right 12/12/2017   Procedure: RIGHT TOTAL KNEE ARTHROPLASTY;  Surgeon: Gaynelle Arabian, MD;  Location: [facility];  Service: Orthopedics;  Laterality: Right;  . TUBAL LIGATION       OB History   None      Home Medications    Prior to Admission medications   Medication Sig Start Date End Date Taking? Authorizing Provider  Cholecalciferol (DIALYVITE VITAMIN D 5000) 125 MCG (5000 UT) capsule Take 5,000 Units by mouth daily.   Yes [provider]  Homeopathic Products (LEG CRAMPS) TABS Take 3 tablets by mouth daily as needed (cramping).   Yes [provider]  HYDROcodone-acetaminophen (NORCO) 5-325 MG tablet Take 1 tablet by mouth every 4 (four) hours as  needed (for pain). 04/04/18  Yes Molpus, John, MD  Multiple Vitamins-Minerals (HAIR SKIN AND NAILS FORMULA) TABS Take 2 tablets by mouth daily.   Yes [provider]  olmesartan-hydrochlorothiazide (BENICAR HCT) 20-12.5 MG tablet Take 1 tablet by mouth daily. 01/26/18  Yes Roma Schanz R, DO  oxyCODONE (OXY IR/ROXICODONE) 5 MG immediate release tablet Take 7.5 mg by mouth every 6 (six) hours as needed for moderate pain or severe pain.    Yes [provider]  predniSONE (DELTASONE) 10 MG tablet Take 10 mg by mouth as directed. Take 6 tablet for 2 Days Take 5 tablet for 2 Days Take 4 tablets for 2 Days Take 3  tablets for 2 Days Take 2 tablets for 2 Days Take 1 tablet for 2 Days   Yes [provider]    Family History Family History  Problem Relation Age of Onset  . Hypertension Mother   . Stroke Mother   . Cancer Mother        lung  . Heart disease Father        cabg  . Cancer Brother        skin    Social History Social History   Tobacco Use  . Smoking status: Former Smoker    Types: Cigarettes    Last attempt to quit: 02/01/2005    Years since quitting: 13.1  . Smokeless tobacco: Never Used  Substance Use Topics  . Alcohol use: No    Alcohol/week: 0.0 standard drinks  . Drug use: No     Allergies   Penicillins   Review of Systems Review of Systems  Constitutional: Positive for appetite change and fever.  HENT: Negative for congestion.   Respiratory: Negative for shortness of breath.   Cardiovascular: Negative for chest pain.  Gastrointestinal: Negative for abdominal pain.  Genitourinary: Positive for frequency.  Musculoskeletal: Negative for neck pain.       Left hip pain  Skin: Positive for wound. Negative for rash.  Neurological: Positive for weakness.  Psychiatric/Behavioral: Negative for confusion.     Physical Exam Updated Vital Signs BP (!) 73/40   Pulse 87   Temp 98.4 F (36.9 C) (Oral)   Resp 18   SpO2 93%   Physical Exam  Constitutional: She is oriented to person, place, and time. She appears well-developed.  HENT:  Head: Atraumatic.  Eyes: Pupils are equal, round, and reactive to light.  Neck: Neck supple.  Cardiovascular: Normal rate.  Abdominal: There is no tenderness.  Musculoskeletal:  Decreased range of motion left hip.  Pain with movement of left hip.  Overall good range of motion right hip and right knee.  Neurological: She is alert and oriented to person, place, and time.  Skin:  Mild left groin induration.     ED Treatments / Results  Labs (all labs ordered are listed, but only abnormal results are  displayed) Labs Reviewed  CBC WITH DIFFERENTIAL/PLATELET - Abnormal; Notable for the following components:      Result Value   WBC 15.4 (*)    RBC 3.26 (*)    Hemoglobin 9.7 (*)    HCT 30.0 (*)    Platelets 83 (*)    Neutro Abs 13.5 (*)    Abs Immature Granulocytes 0.20 (*)    All other components within normal limits  SEDIMENTATION RATE - Abnormal; Notable for the following components:   Sed Rate 53 (*)    All other components within normal limits  C-REACTIVE PROTEIN -  Abnormal; Notable for the following components:   CRP 17.7 (*)    All other components within normal limits  URINALYSIS, ROUTINE W REFLEX MICROSCOPIC - Abnormal; Notable for the following components:   Hgb urine dipstick SMALL (*)    Bacteria, UA RARE (*)    All other components within normal limits  COMPREHENSIVE METABOLIC PANEL - Abnormal; Notable for the following components:   Glucose, Bld 104 (*)    BUN 49 (*)    Creatinine, Ser 1.40 (*)    Calcium 7.9 (*)    Total Protein 6.1 (*)    Albumin 2.7 (*)    Total Bilirubin 1.3 (*)    GFR calc non Af Amer 39 (*)    GFR calc Af Amer 45 (*)    All other components within normal limits  CULTURE, BLOOD (ROUTINE X 2)  CULTURE, BLOOD (ROUTINE X 2)  BODY FLUID CULTURE  INFLUENZA PANEL BY PCR (TYPE A & B)  SYNOVIAL CELL COUNT + DIFF, W/ CRYSTALS  I-STAT CG4 LACTIC ACID, ED  I-STAT CG4 LACTIC ACID, ED  I-STAT CG4 LACTIC ACID, ED    EKG None  Radiology Dg Chest 2 View  Result Date: 04/09/2018 CLINICAL DATA:  Fever. Status post right knee replacement on November 11th. EXAM: CHEST - 2 VIEW COMPARISON:  CT chest dated 01/20/2018 FINDINGS: Trace right pleural effusion, chronic. Left lung is clear. No pneumothorax. Surgical clips in the right perihilar region. The heart is normal in size. Visualized osseous structures are within normal limits. IMPRESSION: Postsurgical changes right hemithorax with chronic trace right pleural effusion. No evidence of acute  cardiopulmonary disease. Electronically Signed   By: Julian Hy M.D.   On: 04/09/2018 17:02   Dg Hip Unilat W Or Wo Pelvis 2-3 Views Left  Result Date: 04/09/2018 CLINICAL DATA:  Left hip pain. EXAM: DG HIP (WITH OR WITHOUT PELVIS) 2-3V LEFT COMPARISON:  Radiographs 1Nov 08, 202019 FINDINGS: Bilateral hip joint degenerative changes severe on the left and moderately severe on the right. There is joint space narrowing and osteophytic spurring. No definite fracture or AVN. The pubic symphysis and SI joints are intact. No pelvic fractures or bone lesions. IMPRESSION: Severe left and moderately severe right hip joint degenerative changes but no definite fracture or AVN. Electronically Signed   By: Marijo Sanes M.D.   On: 04/09/2018 17:00    Procedures Procedures (including critical care time)  Medications Ordered in ED Medications  metroNIDAZOLE (FLAGYL) IVPB 500 mg (0 mg Intravenous Stopped 04/09/18 2006)  vancomycin (VANCOCIN) IVPB 1000 mg/200 mL premix (has no administration in time range)  lactated ringers bolus 1,000 mL (has no administration in time range)  sodium chloride 0.9 % bolus 1,000 mL (0 mLs Intravenous Stopped 04/09/18 1751)  acetaminophen (TYLENOL) tablet 650 mg (650 mg Oral Given 04/09/18 1644)  morphine 4 MG/ML injection 4 mg (4 mg Intravenous Given 04/09/18 1644)  ondansetron (ZOFRAN) injection 4 mg (4 mg Intravenous Given 04/09/18 1644)  ceFEPIme (MAXIPIME) 2 g in sodium chloride 0.9 % 100 mL IVPB (0 g Intravenous Stopped 04/09/18 1841)  lactated ringers bolus 1,000 mL (0 mLs Intravenous Stopped 04/09/18 2006)     Initial Impression / Assessment and Plan / ED Course  I have reviewed the triage vital signs and the nursing notes.  Pertinent labs & imaging results that were available during my care of the patient were reviewed by me and considered in my medical decision making (see chart for details).     Patient with fever.  Mild urinary frequency but urine does not show  clear infection.  Does have white cells.  There was worry of infected left hip.  White count is now elevated but has been on steroids.  Is febrile.  Initial blood pressure was good but then later decreased.  Initial lactic acid normal but then developed hypotension.  Code sepsis called.  Fluid boluses given.  Started empirically on antibiotics although we initially were trying to hold off on antibiotics to get an accurate culture from left hip.  Discussed with Dr. Doran Durand.  Discussed with interventional radiology about getting arthrocentesis left hip however patient's blood pressure has now decreased even more.  Still not tachycardic but blood pressures of been in 70s and 80s.  Has had 2 L fluid bolus and working on her third liter right now.  Will discuss with ICU about admission.  CRITICAL CARE Performed by: Davonna Belling Total critical care time: 40 minutes Critical care time was exclusive of separately billable procedures and treating other patients. Critical care was necessary to treat or prevent imminent or life-threatening deterioration. Critical care was time spent personally by me on the following activities: development of treatment plan with patient and/or surrogate as well as nursing, discussions with consultants, evaluation of patient's response to treatment, examination of patient, obtaining history from patient or surrogate, ordering and performing treatments and interventions, ordering and review of laboratory studies, ordering and review of radiographic studies, pulse oximetry and re-evaluation of patient's condition.  I have discussed with ICU and they will see patient.  Blood pressures continue to be low but patient states she feels better.  Good capillary refill.  Lactic acid had not been repeated at 2 hours and I have asked it to be run now.   Final Clinical Impressions(s) / ED Diagnoses   Final diagnoses:  Fever  Left hip pain  Fever, unspecified fever cause  Sepsis with  acute organ dysfunction and septic shock, due to unspecified organism, unspecified type [facility])    ED Discharge Orders    None       Davonna Belling, MD 04/09/18 [place]    Davonna Belling, MD 04/09/18 2021

## 2018-04-09 NOTE — ED Triage Notes (Signed)
EMS reports from home, seen 3 days ago for fever post right total knee replacement surgery Nov. 11, returns with fever, pain and swelling to right knee. Daughter stated Pt took 1gm Tylenol today.  BP 150/80 HR 110 RR 20 Sp02 98 RA CBG 76  20 LAC 142ml NS enroute

## 2018-04-09 NOTE — H&P (Addendum)
PULMONARY / CRITICAL CARE MEDICINE   NAME:  Elizabeth [name], MRN:  [id number], DOB:  [birth year], LOS: 0 ADMISSION DATE:  04/09/2018, CONSULTATION DATE: 04/09/2018 REFERRING MD: ED physician, CHIEF COMPLAINT: Fever  BRIEF HISTORY:    67 year old lady with osteoarthritis coming in left hip pain and high fever and hypotension suspecting septic arthritis.   HISTORY OF PRESENT ILLNESS   Elizabeth [name] is a 67 year old lady with history of hypertension who is known with osteoarthritis she received a steroid injection into her left hip middle of November and shortly after she started having some severe left hip pain she also developed some fever a week ago which has resolved but now she is having fever again she came in because of that and because of the left hip pain she denies any shortness of breath no chest pain no cough no sputum no dysuria but she does have urinary frequency.  Patient denies any abdominal pain no diarrhea no vomiting.  Patient was found to be hypotensive and highly febrile with temperature of 105 we will consulted for further management patient is receiving IV fluid boluses for systolic blood pressure of 82.  Patient received cefepime and vancomycin.   SIGNIFICANT PAST MEDICAL HISTORY   -Hypertension -History of lung cancer status post lobectomy -Osteoarthritis  SIGNIFICANT EVENTS:    STUDIES:   MRI hip 04/04/2018 1. Advanced degenerative changes involving the left hip as detailed above. I do not see any suspicious MR findings to suggest septic arthritis. An inflammatory/erosive arthropathy is possible such as rheumatoid arthritis or gout. 2. No stress fracture or AVN. 3. Similar but less significant findings involving the right hip. 4. Borderline enlarged operator and external iliac lymph nodes.  CULTURES:  Blood cultures>>  ANTIBIOTICS:  Cefepime>> Vancomycin>>  LINES/TUBES:    CONSULTANTS:    SUBJECTIVE:    CONSTITUTIONAL: BP (!) 73/40   Pulse 87   Temp 98.4  F (36.9 C) (Oral)   Resp 18   SpO2 92%   I/O last 3 completed shifts: In: 1100 [IV Piggyback:1100] Out: -         PHYSICAL EXAM: General: Acutely ill toxic septic looking Neuro: Alert oriented time place and person cranial nerves 1- 12 are intact power 5/5 all over HEENT: Dry mucous membranes Cardiovascular: Normal heart sound normal heart sounds or murmurs Lungs: Clear equal air sounds bilaterally no crackles no wheezing Abdomen: Soft no tenderness no organomegaly Musculoskeletal: No lower limb edema some tenderness on her left hip but no redness. Skin: No rash  RESOLVED PROBLEM LIST   ASSESSMENT AND PLAN   Assessment: -Septic arthritis -Hypotension second severe sepsis -Severe sepsis -Acute kidney injury  Plan: -Cefepime and vancomycin -Follow blood cultures -Patient had an MRI almost a week ago of her hip which was not very conclusive of an infection consider reimaging -Sent for influenza screen -Bolused with IV fluids -Follow urine output and renal function -We appreciate orthopedics input -Admit to the intensive care unit for close monitoring and possible requirement for vasopressors patient is still receiving her fluid boluses at the time of me evaluating her    SUMMARY OF TODAY'S PLAN:    Best Practice / Goals of Care / Disposition.   DVT PROPHYLAXIS: Lovenox SUP: None NUTRITION: Cardiac diet MOBILITY: Bedrest GOALS OF CARE: Full code FAMILY DISCUSSIONS: No family available to discuss with the patient DISPOSITION ICU admission  LABS  Glucose No results for input(s): GLUCAP in the last 168 hours.  BMET Recent Labs  Lab 04/04/18 0133 04/09/18  1801  NA 135 136  K 3.9 3.5  CL 96* 99  CO2 29 25  BUN 31* 49*  CREATININE 1.27* 1.40*  GLUCOSE 162* 104*    Liver Enzymes Recent Labs  Lab 04/09/18 1801  AST 33  ALT 25  ALKPHOS 72  BILITOT 1.3*  ALBUMIN 2.7*    Electrolytes Recent Labs  Lab 04/04/18 0133 04/09/18 1801  CALCIUM 8.5*  7.9*    CBC Recent Labs  Lab 04/04/18 0133 04/09/18 1600  WBC 10.1 15.4*  HGB 10.5* 9.7*  HCT 32.6* 30.0*  PLT 136* 83*    ABG No results for input(s): PHART, PCO2ART, PO2ART in the last 168 hours.  Coag's No results for input(s): APTT, INR in the last 168 hours.  Sepsis Markers Recent Labs  Lab 04/09/18 1618 04/09/18 2030  LATICACIDVEN 1.84 1.30    Cardiac Enzymes No results for input(s): TROPONINI, PROBNP in the last 168 hours.  PAST MEDICAL HISTORY :   She  has a past medical history of Aortic stenosis, mild (01/08/2015), cancer of lung (6/10), Hyperlipidemia, Hypertension, Low back pain, Morbid obesity ([place]) (01/08/2015), Right foot injury, and Vitamin D deficiency.  PAST SURGICAL HISTORY:  She  has a past surgical history that includes Cholecystectomy; Tubal ligation; Lung removal, partial; Ankle Fusion (Left); and Total knee arthroplasty (Right, 12/12/2017).  Allergies  Allergen Reactions  . Penicillins Other (See Comments)    Fever Has patient had a PCN reaction causing immediate rash, facial/tongue/throat swelling, SOB or lightheadedness with hypotension: Unknown Has patient had a PCN reaction causing severe rash involving mucus membranes or skin necrosis: Unknown Has patient had a PCN reaction that required hospitalization: Unknown Has patient had a PCN reaction occurring within the last 10 years: No Childhood allergy If all of the above answers are "NO", then may proceed with Cephalosporin use.     No current facility-administered medications on file prior to encounter.    Current Outpatient Medications on File Prior to Encounter  Medication Sig  . Cholecalciferol (DIALYVITE VITAMIN D 5000) 125 MCG (5000 UT) capsule Take 5,000 Units by mouth daily.  . Homeopathic Products (LEG CRAMPS) TABS Take 3 tablets by mouth daily as needed (cramping).  Marland Kitchen HYDROcodone-acetaminophen (NORCO) 5-325 MG tablet Take 1 tablet by mouth every 4 (four) hours as needed (for  pain).  . Multiple Vitamins-Minerals (HAIR SKIN AND NAILS FORMULA) TABS Take 2 tablets by mouth daily.  Marland Kitchen olmesartan-hydrochlorothiazide (BENICAR HCT) 20-12.5 MG tablet Take 1 tablet by mouth daily.  Marland Kitchen oxyCODONE (OXY IR/ROXICODONE) 5 MG immediate release tablet Take 7.5 mg by mouth every 6 (six) hours as needed for moderate pain or severe pain.   . predniSONE (DELTASONE) 10 MG tablet Take 10 mg by mouth as directed. Take 6 tablet for 2 Days Take 5 tablet for 2 Days Take 4 tablets for 2 Days Take 3 tablets for 2 Days Take 2 tablets for 2 Days Take 1 tablet for 2 Days    FAMILY HISTORY:   Her family history includes Cancer in her brother and mother; Heart disease in her father; Hypertension in her mother; Stroke in her mother.  SOCIAL HISTORY:  She  reports that she quit smoking about 13 years ago. Her smoking use included cigarettes. She has never used smokeless tobacco. She reports that she does not drink alcohol or use drugs.  REVIEW OF SYSTEMS:    All 11 point system review were unremarkable other than was mentioned history of present illness

## 2018-04-09 NOTE — ED Notes (Signed)
ED TO INPATIENT HANDOFF REPORT  Name/Age/Gender Elizabeth [name] 67 y.o. female  Code Status    Code Status Orders  (From admission, onward)         Start     Ordered   04/09/18 2053  Full code  Continuous     04/09/18 2054        Code Status History    Date Active Date Inactive Code Status Order ID Comments User Context   12/12/2017 1217 12/14/2017 1912 Full Code [id number]  Gaynelle Arabian, MD Inpatient    Advance Directive Documentation     Most Recent Value  Type of Advance Directive  Healthcare Power of Attorney  Pre-existing out of facility DNR order (yellow form or pink MOST form)  -  "MOST" Form in Place?  -      Home/SNF/Other Home  Chief Complaint Post op problems  Level of Care/Admitting Diagnosis ED Disposition    ED Disposition Condition [place]: [place] [[id number]]  Level of Care: ICU [6]  Diagnosis: Septic arthritis [facility]) [[id number]]  Admitting Physician: Aldean Jewett [[id number]]  Attending Physician: Aldean Jewett [phone]  Estimated length of stay: 5 - 7 days  Certification:: I certify this patient will need inpatient services for at least 2 midnights  PT Class (Do Not Modify): Inpatient [101]  PT Acc Code (Do Not Modify): Private [1]       Medical History Past Medical History:  Diagnosis Date  . Aortic stenosis, mild 01/08/2015  . Hx of cancer of lung 6/10   Removed June 2010  . Hyperlipidemia   . Hypertension   . Low back pain   . Morbid obesity ([place]) 01/08/2015  . Right foot injury    report she sustained a fracture to her right , present with ortho boot on affected extremity   . Vitamin D deficiency     Allergies Allergies  Allergen Reactions  . Penicillins Other (See Comments)    Fever Has patient had a PCN reaction causing immediate rash, facial/tongue/throat swelling, SOB or lightheadedness with hypotension: Unknown Has patient had a PCN reaction causing severe rash involving mucus  membranes or skin necrosis: Unknown Has patient had a PCN reaction that required hospitalization: Unknown Has patient had a PCN reaction occurring within the last 10 years: No Childhood allergy If all of the above answers are "NO", then may proceed with Cephalosporin use.     IV Location/Drains/Wounds Patient Lines/Drains/Airways Status   Active Line/Drains/Airways    Name:   Placement date:   Placement time:   Site:   Days:   Peripheral IV 04/09/18 Left Antecubital   04/09/18    1518    Antecubital   less than 1   Incision (Closed) 12/12/17 Knee Right   12/12/17    1012     118          Labs/Imaging Results for orders placed or performed during the hospital encounter of 04/09/18 (from the past 48 hour(s))  CBC with Differential     Status: Abnormal   Collection Time: 04/09/18  4:00 PM  Result Value Ref Range   WBC 15.4 (H) 4.0 - 10.5 K/uL   RBC 3.26 (L) 3.87 - 5.11 MIL/uL   Hemoglobin 9.7 (L) 12.0 - 15.0 g/dL   HCT 30.0 (L) 36.0 - 46.0 %   MCV 92.0 80.0 - 100.0 fL   MCH 29.8 26.0 - 34.0 pg   MCHC 32.3 30.0 - 36.0  g/dL   RDW 14.6 11.5 - 15.5 %   Platelets 83 (L) 150 - 400 K/uL    Comment: REPEATED TO VERIFY SPECIMEN CHECKED FOR CLOTS Immature Platelet Fraction may be clinically indicated, consider ordering this additional test [id number]    nRBC 0.0 0.0 - 0.2 %   Neutrophils Relative % 89 %   Neutro Abs 13.5 (H) 1.7 - 7.7 K/uL   Lymphocytes Relative 6 %   Lymphs Abs 1.0 0.7 - 4.0 K/uL   Monocytes Relative 4 %   Monocytes Absolute 0.6 0.1 - 1.0 K/uL   Eosinophils Relative 0 %   Eosinophils Absolute 0.0 0.0 - 0.5 K/uL   Basophils Relative 0 %   Basophils Absolute 0.0 0.0 - 0.1 K/uL   Immature Granulocytes 1 %   Abs Immature Granulocytes 0.20 (H) 0.00 - 0.07 K/uL    Comment: Performed at [facility], [place] [address] [place], [place] [address]  Sedimentation rate     Status: Abnormal   Collection Time: 04/09/18  4:00 PM  Result Value Ref Range    Sed Rate 53 (H) 0 - 22 mm/hr    Comment: Performed at [facility], [place] [address] [place], [place] [address]  C-reactive protein     Status: Abnormal   Collection Time: 04/09/18  4:00 PM  Result Value Ref Range   CRP 17.7 (H) <1.0 mg/dL    Comment: Performed at [facility], [place] [address], [place], [place] [address]  I-Stat CG4 Lactic Acid, ED     Status: None   Collection Time: 04/09/18  4:18 PM  Result Value Ref Range   Lactic Acid, Venous 1.84 0.5 - 1.9 mmol/L  Urinalysis, Routine w reflex microscopic     Status: Abnormal   Collection Time: 04/09/18  5:39 PM  Result Value Ref Range   Color, Urine YELLOW YELLOW   APPearance CLEAR CLEAR   Specific Gravity, Urine 1.015 1.005 - 1.030   pH 5.0 5.0 - 8.0   Glucose, UA NEGATIVE NEGATIVE mg/dL   Hgb urine dipstick SMALL (A) NEGATIVE   Bilirubin Urine NEGATIVE NEGATIVE   Ketones, ur NEGATIVE NEGATIVE mg/dL   Protein, ur NEGATIVE NEGATIVE mg/dL   Nitrite NEGATIVE NEGATIVE   Leukocytes, UA NEGATIVE NEGATIVE   RBC / HPF 0-5 0 - 5 RBC/hpf   WBC, UA 6-10 0 - 5 WBC/hpf   Bacteria, UA RARE (A) NONE SEEN   Squamous Epithelial / LPF 0-5 0 - 5   Mucus PRESENT     Comment: Performed at [facility], [place] [address], [place], [place] [address]  Comprehensive metabolic panel     Status: Abnormal   Collection Time: 04/09/18  6:01 PM  Result Value Ref Range   Sodium 136 135 - 145 mmol/L   Potassium 3.5 3.5 - 5.1 mmol/L   Chloride 99 98 - 111 mmol/L   CO2 25 22 - 32 mmol/L   Glucose, Bld 104 (H) 70 - 99 mg/dL   BUN 49 (H) 8 - 23 mg/dL   Creatinine, Ser 1.40 (H) 0.44 - 1.00 mg/dL   Calcium 7.9 (L) 8.9 - 10.3 mg/dL   Total Protein 6.1 (L) 6.5 - 8.1 g/dL   Albumin 2.7 (L) 3.5 - 5.0 g/dL   AST 33 15 - 41 U/L   ALT 25 0 - 44 U/L   Alkaline Phosphatase 72 38 - 126 U/L   Total Bilirubin 1.3 (H) 0.3 - 1.2 mg/dL   GFR  calc non Af Amer 39 (L) >60 mL/min   GFR calc Af Amer 45 (L) >60 mL/min    Anion gap 12 5 - 15    Comment: Performed at [facility], [place] [address], [place], [place] [address]  I-Stat CG4 Lactic Acid, ED     Status: None   Collection Time: 04/09/18  8:30 PM  Result Value Ref Range   Lactic Acid, Venous 1.30 0.5 - 1.9 mmol/L   Dg Chest 2 View  Result Date: 04/09/2018 CLINICAL DATA:  Fever. Status post right knee replacement on November 11th. EXAM: CHEST - 2 VIEW COMPARISON:  CT chest dated 01/20/2018 FINDINGS: Trace right pleural effusion, chronic. Left lung is clear. No pneumothorax. Surgical clips in the right perihilar region. The heart is normal in size. Visualized osseous structures are within normal limits. IMPRESSION: Postsurgical changes right hemithorax with chronic trace right pleural effusion. No evidence of acute cardiopulmonary disease. Electronically Signed   By: Julian Hy M.D.   On: 04/09/2018 17:02   Dg Hip Unilat W Or Wo Pelvis 2-3 Views Left  Result Date: 04/09/2018 CLINICAL DATA:  Left hip pain. EXAM: DG HIP (WITH OR WITHOUT PELVIS) 2-3V LEFT COMPARISON:  Radiographs 1Sep 01, 202019 FINDINGS: Bilateral hip joint degenerative changes severe on the left and moderately severe on the right. There is joint space narrowing and osteophytic spurring. No definite fracture or AVN. The pubic symphysis and SI joints are intact. No pelvic fractures or bone lesions. IMPRESSION: Severe left and moderately severe right hip joint degenerative changes but no definite fracture or AVN. Electronically Signed   By: Marijo Sanes M.D.   On: 04/09/2018 17:00   None  Pending Labs Unresulted Labs (From admission, onward)    Start     Ordered   04/16/18 0500  Creatinine, serum  (enoxaparin (LOVENOX)    CrCl >/= 30 ml/min)  Weekly,   R    Comments:  while on enoxaparin therapy    04/09/18 2054   04/10/18 0500  CBC  Tomorrow morning,   R     04/09/18 2054   04/10/18 1157  Basic metabolic panel  Tomorrow morning,   R     04/09/18 2054   04/09/18  2052  HIV antibody (Routine Testing)  Once,   R     04/09/18 2054   04/09/18 2052  CBC  (enoxaparin (LOVENOX)    CrCl >/= 30 ml/min)  Once,   R    Comments:  Baseline for enoxaparin therapy IF NOT ALREADY DRAWN.  Notify MD if PLT < 100 K.    04/09/18 2054   04/09/18 2052  Creatinine, serum  (enoxaparin (LOVENOX)    CrCl >/= 30 ml/min)  Once,   R    Comments:  Baseline for enoxaparin therapy IF NOT ALREADY DRAWN.    04/09/18 2054   04/09/18 1837  Body fluid culture  Once,   R    Question:  Are there also cytology or pathology orders on this specimen?  Answer:  No   04/09/18 1836   04/09/18 1836  Synovial cell count + diff, w/ crystals  Once,   R     04/09/18 1836   04/09/18 1816  Influenza panel by PCR (type A & B)  (Influenza PCR Panel)  Once,   R     04/09/18 1815   04/09/18 1601  Culture, blood (routine x 2)  BLOOD CULTURE X 2,   STAT     04/09/18 2620  Vitals/Pain Today's Vitals   04/09/18 1847 04/09/18 1935 04/09/18 2023 04/09/18 2111  BP: (!) 73/40   (!) 93/46  Pulse: 87   76  Resp: 18   18  Temp:  98.4 F (36.9 C)    TempSrc:  Oral    SpO2: 93%  92% 93%  PainSc:        Isolation Precautions No active isolations  Medications Medications  metroNIDAZOLE (FLAGYL) IVPB 500 mg (0 mg Intravenous Stopped 04/09/18 2006)  enoxaparin (LOVENOX) injection 40 mg (has no administration in time range)  lactated ringers infusion (has no administration in time range)  sodium chloride 0.9 % bolus 1,000 mL (has no administration in time range)  acetaminophen (TYLENOL) tablet 650 mg (has no administration in time range)  HYDROcodone-acetaminophen (NORCO/VICODIN) 5-325 MG per tablet 1 tablet (has no administration in time range)  vancomycin (VANCOCIN) 500 mg in sodium chloride 0.9 % 100 mL IVPB (has no administration in time range)  vancomycin (VANCOCIN) IVPB 1000 mg/200 mL premix (has no administration in time range)  ceFEPIme (MAXIPIME) 2 g in sodium chloride 0.9 % 100 mL  IVPB (has no administration in time range)  sodium chloride 0.9 % bolus 1,000 mL (0 mLs Intravenous Stopped 04/09/18 1751)  acetaminophen (TYLENOL) tablet 650 mg (650 mg Oral Given 04/09/18 1644)  morphine 4 MG/ML injection 4 mg (4 mg Intravenous Given 04/09/18 1644)  ondansetron (ZOFRAN) injection 4 mg (4 mg Intravenous Given 04/09/18 1644)  ceFEPIme (MAXIPIME) 2 g in sodium chloride 0.9 % 100 mL IVPB (0 g Intravenous Stopped 04/09/18 1841)  vancomycin (VANCOCIN) IVPB 1000 mg/200 mL premix (1,000 mg Intravenous New Bag/Given 04/09/18 2021)  lactated ringers bolus 1,000 mL (0 mLs Intravenous Stopped 04/09/18 2006)  lactated ringers bolus 1,000 mL (1,000 mLs Intravenous New Bag/Given 04/09/18 2021)    Mobility walks with device but not currently use wheel chair knee issue

## 2018-04-09 NOTE — Consult Note (Signed)
Reason for Consult: Left hip pain Referring Physician: [name] is an 67 y.o. female.  HPI: The patient is a 67 year old female with a past medical history significant for bilateral hip arthritis.  She had an injection of her left hip in mid November.  A few days later she started having pain in that hip that has been troublesome ever since.  She presented to the emergency room a few days ago.  An MRI at that time showed no significant effusion.  She was treated with a round of steroids for what was diagnosed as a reactive synovitis of the left hip.  Today she began having increased pain in the left hip and was unable to bear weight.  She presented to the emergency room after having a fever of 105 degrees.  She complains of sharp severe pain in the left hip with any attempted motion.  She feels better after having some pain medicine.  She denies nausea and vomiting.  In the emergency room she was found to be hypotensive and tachycardic with an elevated white blood cell count as well as elevated sed rate and CRP.  Initially the plan was to hold antibiotics and get an arthrocentesis and interventional radiology.  Due to the hypotension she was started on vancomycin and cefepime.  She is accompanied by her daughter.  She denies any history of diabetes.  She is not a smoker.  Past Medical History:  Diagnosis Date  . Aortic stenosis, mild 01/08/2015  . Hx of cancer of lung 6/10   Removed June 2010  . Hyperlipidemia   . Hypertension   . Low back pain   . Morbid obesity ([place]) 01/08/2015  . Right foot injury    report she sustained a fracture to her right , present with ortho boot on affected extremity   . Vitamin D deficiency     Past Surgical History:  Procedure Laterality Date  . ANKLE FUSION Left   . CHOLECYSTECTOMY    . LUNG REMOVAL, PARTIAL    . TOTAL KNEE ARTHROPLASTY Right 12/12/2017   Procedure: RIGHT TOTAL KNEE ARTHROPLASTY;  Surgeon: Gaynelle Arabian, MD;  Location: [facility];   Service: Orthopedics;  Laterality: Right;  . TUBAL LIGATION      Family History  Problem Relation Age of Onset  . Hypertension Mother   . Stroke Mother   . Cancer Mother        lung  . Heart disease Father        cabg  . Cancer Brother        skin    Social History:  reports that she quit smoking about 13 years ago. Her smoking use included cigarettes. She has never used smokeless tobacco. She reports that she does not drink alcohol or use drugs.  Allergies:  Allergies  Allergen Reactions  . Penicillins Other (See Comments)    Fever Has patient had a PCN reaction causing immediate rash, facial/tongue/throat swelling, SOB or lightheadedness with hypotension: Unknown Has patient had a PCN reaction causing severe rash involving mucus membranes or skin necrosis: Unknown Has patient had a PCN reaction that required hospitalization: Unknown Has patient had a PCN reaction occurring within the last 10 years: No Childhood allergy If all of the above answers are "NO", then may proceed with Cephalosporin use.     Medications: I have reviewed the patient's current medications.  Results for orders placed or performed during the hospital encounter of 04/09/18 (from the past 48 hour(s))  CBC with Differential     Status: Abnormal   Collection Time: 04/09/18  4:00 PM  Result Value Ref Range   WBC 15.4 (H) 4.0 - 10.5 K/uL   RBC 3.26 (L) 3.87 - 5.11 MIL/uL   Hemoglobin 9.7 (L) 12.0 - 15.0 g/dL   HCT 30.0 (L) 36.0 - 46.0 %   MCV 92.0 80.0 - 100.0 fL   MCH 29.8 26.0 - 34.0 pg   MCHC 32.3 30.0 - 36.0 g/dL   RDW 14.6 11.5 - 15.5 %   Platelets 83 (L) 150 - 400 K/uL    Comment: REPEATED TO VERIFY SPECIMEN CHECKED FOR CLOTS Immature Platelet Fraction may be clinically indicated, consider ordering this additional test [id number]    nRBC 0.0 0.0 - 0.2 %   Neutrophils Relative % 89 %   Neutro Abs 13.5 (H) 1.7 - 7.7 K/uL   Lymphocytes Relative 6 %   Lymphs Abs 1.0 0.7 - 4.0 K/uL    Monocytes Relative 4 %   Monocytes Absolute 0.6 0.1 - 1.0 K/uL   Eosinophils Relative 0 %   Eosinophils Absolute 0.0 0.0 - 0.5 K/uL   Basophils Relative 0 %   Basophils Absolute 0.0 0.0 - 0.1 K/uL   Immature Granulocytes 1 %   Abs Immature Granulocytes 0.20 (H) 0.00 - 0.07 K/uL    Comment: Performed at [facility], [place] [address], [place], [place] [address]  Sedimentation rate     Status: Abnormal   Collection Time: 04/09/18  4:00 PM  Result Value Ref Range   Sed Rate 53 (H) 0 - 22 mm/hr    Comment: Performed at [facility], [place] [address], [place], [place] [address]  C-reactive protein     Status: Abnormal   Collection Time: 04/09/18  4:00 PM  Result Value Ref Range   CRP 17.7 (H) <1.0 mg/dL    Comment: Performed at [facility], [place] [address] [place], [place] [address]  I-Stat CG4 Lactic Acid, ED     Status: None   Collection Time: 04/09/18  4:18 PM  Result Value Ref Range   Lactic Acid, Venous 1.84 0.5 - 1.9 mmol/L  Urinalysis, Routine w reflex microscopic     Status: Abnormal   Collection Time: 04/09/18  5:39 PM  Result Value Ref Range   Color, Urine YELLOW YELLOW   APPearance CLEAR CLEAR   Specific Gravity, Urine 1.015 1.005 - 1.030   pH 5.0 5.0 - 8.0   Glucose, UA NEGATIVE NEGATIVE mg/dL   Hgb urine dipstick SMALL (A) NEGATIVE   Bilirubin Urine NEGATIVE NEGATIVE   Ketones, ur NEGATIVE NEGATIVE mg/dL   Protein, ur NEGATIVE NEGATIVE mg/dL   Nitrite NEGATIVE NEGATIVE   Leukocytes, UA NEGATIVE NEGATIVE   RBC / HPF 0-5 0 - 5 RBC/hpf   WBC, UA 6-10 0 - 5 WBC/hpf   Bacteria, UA RARE (A) NONE SEEN   Squamous Epithelial / LPF 0-5 0 - 5   Mucus PRESENT     Comment: Performed at [facility], [place] [address], [place], [place] [address]  Comprehensive metabolic panel     Status: Abnormal   Collection Time: 04/09/18  6:01 PM  Result Value Ref Range   Sodium 136 135 - 145 mmol/L   Potassium 3.5 3.5 -  5.1 mmol/L   Chloride 99 98 - 111 mmol/L   CO2 25 22 - 32 mmol/L   Glucose, Bld 104 (H) 70 - 99 mg/dL   BUN  49 (H) 8 - 23 mg/dL   Creatinine, Ser 1.40 (H) 0.44 - 1.00 mg/dL   Calcium 7.9 (L) 8.9 - 10.3 mg/dL   Total Protein 6.1 (L) 6.5 - 8.1 g/dL   Albumin 2.7 (L) 3.5 - 5.0 g/dL   AST 33 15 - 41 U/L   ALT 25 0 - 44 U/L   Alkaline Phosphatase 72 38 - 126 U/L   Total Bilirubin 1.3 (H) 0.3 - 1.2 mg/dL   GFR calc non Af Amer 39 (L) >60 mL/min   GFR calc Af Amer 45 (L) >60 mL/min   Anion gap 12 5 - 15    Comment: Performed at [facility], [place] [address] [place], [place] [address]  I-Stat CG4 Lactic Acid, ED     Status: None   Collection Time: 04/09/18  8:30 PM  Result Value Ref Range   Lactic Acid, Venous 1.30 0.5 - 1.9 mmol/L    Dg Chest 2 View  Result Date: 04/09/2018 CLINICAL DATA:  Fever. Status post right knee replacement on November 11th. EXAM: CHEST - 2 VIEW COMPARISON:  CT chest dated 01/20/2018 FINDINGS: Trace right pleural effusion, chronic. Left lung is clear. No pneumothorax. Surgical clips in the right perihilar region. The heart is normal in size. Visualized osseous structures are within normal limits. IMPRESSION: Postsurgical changes right hemithorax with chronic trace right pleural effusion. No evidence of acute cardiopulmonary disease. Electronically Signed   By: Julian Hy M.D.   On: 04/09/2018 17:02   Dg Hip Unilat W Or Wo Pelvis 2-3 Views Left  Result Date: 04/09/2018 CLINICAL DATA:  Left hip pain. EXAM: DG HIP (WITH OR WITHOUT PELVIS) 2-3V LEFT COMPARISON:  Radiographs 1Jul 04, 202019 FINDINGS: Bilateral hip joint degenerative changes severe on the left and moderately severe on the right. There is joint space narrowing and osteophytic spurring. No definite fracture or AVN. The pubic symphysis and SI joints are intact. No pelvic fractures or bone lesions. IMPRESSION: Severe left and moderately severe right hip joint degenerative changes but no  definite fracture or AVN. Electronically Signed   By: Marijo Sanes M.D.   On: 04/09/2018 17:00    ROS: Positive for fever and left hip pain.  No recent nausea, vomiting or chills. PE:  Blood pressure (!) 93/46, pulse 76, temperature 98.4 F (36.9 C), temperature source Oral, resp. rate 18, SpO2 93 %. Well-nourished well-developed overweight woman in no apparent distress.  Alert and oriented x4.  Mood and affect are normal.  Extraocular motions are intact.  Respirations are unlabored.  Her left hip is held flexed about 30 degrees.  She has mild pain with internal and external rotation.  5 out of 5 strength in plantar flexion and dorsiflexion of the ankle and toes.  No left lower extremity lymphadenopathy is noted.  Skin of the left lower extremity is normal.  Sensibility to light touch is intact dorsally and plantarly at the foot.   Assessment/Plan: Acute exacerbation of chronic left hip pain -the patient's signs and symptoms are worrisome for pyarthrosis of the left hip.  At this point there is no other evident source of infection.  With her hypotension and tachycardia she is not currently a candidate for arthrocentesis or surgical drainage.  She will be admitted to the intensive care unit under the care of the critical care team.  IV antibiotics are appropriate at this point.  Hopefully she will be able to undergo arthrocentesis in the morning to further guide treatment.  I will contact Dr.  Alusio in the morning to update him.  Elizabeth [name] 04/09/2018, 9:30 PM

## 2018-04-10 ENCOUNTER — Inpatient Hospital Stay (HOSPITAL_COMMUNITY): Payer: Medicare Other

## 2018-04-10 DIAGNOSIS — E785 Hyperlipidemia, unspecified: Secondary | ICD-10-CM

## 2018-04-10 DIAGNOSIS — Z7401 Bed confinement status: Secondary | ICD-10-CM

## 2018-04-10 DIAGNOSIS — I350 Nonrheumatic aortic (valve) stenosis: Secondary | ICD-10-CM

## 2018-04-10 DIAGNOSIS — Z880 Allergy status to penicillin: Secondary | ICD-10-CM

## 2018-04-10 DIAGNOSIS — M545 Low back pain: Secondary | ICD-10-CM

## 2018-04-10 DIAGNOSIS — I10 Essential (primary) hypertension: Secondary | ICD-10-CM

## 2018-04-10 DIAGNOSIS — Z87891 Personal history of nicotine dependence: Secondary | ICD-10-CM

## 2018-04-10 DIAGNOSIS — R6521 Severe sepsis with septic shock: Secondary | ICD-10-CM

## 2018-04-10 DIAGNOSIS — M1711 Unilateral primary osteoarthritis, right knee: Secondary | ICD-10-CM

## 2018-04-10 DIAGNOSIS — Z96651 Presence of right artificial knee joint: Secondary | ICD-10-CM

## 2018-04-10 DIAGNOSIS — L8990 Pressure ulcer of unspecified site, unspecified stage: Secondary | ICD-10-CM

## 2018-04-10 LAB — BLOOD CULTURE ID PANEL (REFLEXED)
Acinetobacter baumannii: NOT DETECTED
Candida albicans: NOT DETECTED
Candida glabrata: NOT DETECTED
Candida krusei: NOT DETECTED
Candida parapsilosis: NOT DETECTED
Candida tropicalis: NOT DETECTED
ENTEROCOCCUS SPECIES: NOT DETECTED
Enterobacter cloacae complex: NOT DETECTED
Enterobacteriaceae species: NOT DETECTED
Escherichia coli: NOT DETECTED
Haemophilus influenzae: NOT DETECTED
Klebsiella oxytoca: NOT DETECTED
Klebsiella pneumoniae: NOT DETECTED
Listeria monocytogenes: NOT DETECTED
Methicillin resistance: NOT DETECTED
Neisseria meningitidis: NOT DETECTED
PROTEUS SPECIES: NOT DETECTED
Pseudomonas aeruginosa: NOT DETECTED
Serratia marcescens: NOT DETECTED
Staphylococcus aureus (BCID): NOT DETECTED
Staphylococcus species: DETECTED — AB
Streptococcus agalactiae: NOT DETECTED
Streptococcus pneumoniae: NOT DETECTED
Streptococcus pyogenes: NOT DETECTED
Streptococcus species: NOT DETECTED

## 2018-04-10 LAB — HIV ANTIBODY (ROUTINE TESTING W REFLEX): HIV SCREEN 4TH GENERATION: NONREACTIVE

## 2018-04-10 LAB — BASIC METABOLIC PANEL
Anion gap: 9 (ref 5–15)
BUN: 40 mg/dL — AB (ref 8–23)
CO2: 25 mmol/L (ref 22–32)
Calcium: 7.8 mg/dL — ABNORMAL LOW (ref 8.9–10.3)
Chloride: 105 mmol/L (ref 98–111)
Creatinine, Ser: 1.27 mg/dL — ABNORMAL HIGH (ref 0.44–1.00)
GFR calc Af Amer: 50 mL/min — ABNORMAL LOW (ref >60)
GFR calc non Af Amer: 43 mL/min — ABNORMAL LOW (ref >60)
Glucose, Bld: 110 mg/dL — ABNORMAL HIGH (ref 70–99)
Potassium: 3.9 mmol/L (ref 3.5–5.1)
SODIUM: 139 mmol/L (ref 135–145)

## 2018-04-10 LAB — CBC
HCT: 32.5 % — ABNORMAL LOW (ref 36.0–46.0)
Hemoglobin: 10.2 g/dL — ABNORMAL LOW (ref 12.0–15.0)
MCH: 29.1 pg (ref 26.0–34.0)
MCHC: 31.4 g/dL (ref 30.0–36.0)
MCV: 92.9 fL (ref 80.0–100.0)
Platelets: 84 10*3/uL — ABNORMAL LOW (ref 150–400)
RBC: 3.5 MIL/uL — ABNORMAL LOW (ref 3.87–5.11)
RDW: 14.8 % (ref 11.5–15.5)
WBC: 22.8 10*3/uL — ABNORMAL HIGH (ref 4.0–10.5)
nRBC: 0 % (ref 0.0–0.2)

## 2018-04-10 LAB — MRSA PCR SCREENING: MRSA by PCR: NEGATIVE

## 2018-04-10 MED ORDER — VANCOMYCIN HCL 10 G IV SOLR
1250.0000 mg | INTRAVENOUS | Status: DC
  Administered 2018-04-11: 1250 mg via INTRAVENOUS
  Filled 2018-04-10: qty 1250

## 2018-04-10 MED ORDER — CEFAZOLIN SODIUM-DEXTROSE 2-4 GM/100ML-% IV SOLN
2.0000 g | Freq: Three times a day (TID) | INTRAVENOUS | Status: DC
  Administered 2018-04-10 – 2018-04-30 (×55): 2 g via INTRAVENOUS
  Filled 2018-04-10 (×64): qty 100

## 2018-04-10 MED ORDER — PRO-STAT SUGAR FREE PO LIQD
30.0000 mL | Freq: Two times a day (BID) | ORAL | Status: DC
  Administered 2018-04-10 – 2018-04-22 (×19): 30 mL via ORAL
  Filled 2018-04-10 (×23): qty 30

## 2018-04-10 MED ORDER — LIDOCAINE HCL 1 % IJ SOLN
INTRAMUSCULAR | Status: AC
  Filled 2018-04-10: qty 20

## 2018-04-10 MED ORDER — ADULT MULTIVITAMIN W/MINERALS CH
1.0000 | ORAL_TABLET | Freq: Every day | ORAL | Status: DC
  Administered 2018-04-11 – 2018-04-23 (×11): 1 via ORAL
  Filled 2018-04-10 (×13): qty 1

## 2018-04-10 MED ORDER — PHENYLEPHRINE HCL-NACL 10-0.9 MG/250ML-% IV SOLN
0.0000 ug/min | INTRAVENOUS | Status: DC
  Administered 2018-04-10: 20 ug/min via INTRAVENOUS
  Administered 2018-04-10: 35 ug/min via INTRAVENOUS
  Administered 2018-04-11: 75 ug/min via INTRAVENOUS
  Administered 2018-04-11: 65 ug/min via INTRAVENOUS
  Administered 2018-04-12: 35 ug/min via INTRAVENOUS
  Administered 2018-04-12: 10 ug/min via INTRAVENOUS
  Administered 2018-04-12: 5 ug/min via INTRAVENOUS
  Administered 2018-04-14 – 2018-04-15 (×3): 18 ug/min via INTRAVENOUS
  Filled 2018-04-10 (×19): qty 250

## 2018-04-10 MED ORDER — FENTANYL CITRATE (PF) 100 MCG/2ML IJ SOLN
25.0000 ug | INTRAMUSCULAR | Status: DC | PRN
  Filled 2018-04-10: qty 2

## 2018-04-10 MED ORDER — IOPAMIDOL (ISOVUE-300) INJECTION 61%
INTRAVENOUS | Status: AC
  Administered 2018-04-10: 1 mL via INTRA_ARTICULAR
  Filled 2018-04-10: qty 50

## 2018-04-10 NOTE — Progress Notes (Signed)
Pharmacy Antibiotic Note  Elizabeth [name] is a 67 y.o. female admitted on 04/09/2018 with fever of unspecified cause (possible septic arthritis with steroid hip injection) and acute kidney injury.  Pharmacy has been consulted for vancomycin and cefepime dosing.  04/10/18 - BCx: Staph species w/o resistance detected in 4/4 - SCr improving - WBC 16.3 >> 22.8 - Tm 100.8  Plan: Cefepime d/c  Will increase vancomycin dosing to 1250 mg iv q 36 hours with improvement in SCr pending final culture results and plans for abx. AUC 476 IBW/ABW.   Messaged MD with recommendation to d/c metronidazole  F/U renal function, culture results, plans for antibiotics   Height: 5\' 4"  (162.6 cm) Weight: 245 lb 13 oz (111.5 kg) IBW/kg (Calculated) : 54.7  Temp (24hrs), Avg:99.4 F (37.4 C), Min:97.9 F (36.6 C), Max:101.5 F (38.6 C)  Recent Labs  Lab 04/04/18 0133 04/09/18 1600 04/09/18 1618 04/09/18 1801 04/09/18 2030 04/09/18 2052 04/10/18 0321  WBC 10.1 15.4*  --   --   --  16.3* 22.8*  CREATININE 1.27*  --   --  1.40*  --  1.54* 1.27*  LATICACIDVEN  --   --  1.84  --  1.30  --   --     Estimated Creatinine Clearance: 51.8 mL/min (A) (by C-G formula based on SCr of 1.27 mg/dL (H)).    Allergies  Allergen Reactions  . Penicillins Other (See Comments)    Fever Has patient had a PCN reaction causing immediate rash, facial/tongue/throat swelling, SOB or lightheadedness with hypotension: Unknown Has patient had a PCN reaction causing severe rash involving mucus membranes or skin necrosis: Unknown Has patient had a PCN reaction that required hospitalization: Unknown Has patient had a PCN reaction occurring within the last 10 years: No Childhood allergy If all of the above answers are "NO", then may proceed with Cephalosporin use.     Antimicrobials this admission: 12/8 cefepime >> 12/9 12/8 vancomycin >>  12/8 metronidazole > 12/9  Dose adjustments this admission:   Microbiology  results: 12/8 BCx: Staph species 4/4 no methicillin resistance Joint aspirate:   Thank you for allowing pharmacy to be a part of this patient's care.  [name] 04/10/2018 3:32 PM

## 2018-04-10 NOTE — Telephone Encounter (Signed)
Ok to refill 

## 2018-04-10 NOTE — Progress Notes (Signed)
PHARMACY - PHYSICIAN COMMUNICATION CRITICAL VALUE ALERT - BLOOD CULTURE IDENTIFICATION (BCID)  Elizabeth [name] is an 67 y.o. female who presented to [facility] on 04/09/2018 with a chief complaint of septic arthritis   Assessment:  Recent steroid hip injection. Staph species in all 4 sets   Name of physician (or Provider) Contacted: Dr. Lake Bells  Current antibiotics: vancomycin and cefepime  Changes to prescribed antibiotics recommended: none, MD notified pending evaluation by MD  Will f/u plans for narrowing antibiotics   Results for orders placed or performed during the hospital encounter of 04/09/18  Blood Culture ID Panel (Reflexed) (Collected: 04/09/2018  4:01 PM)  Result Value Ref Range   Enterococcus species NOT DETECTED NOT DETECTED   Listeria monocytogenes NOT DETECTED NOT DETECTED   Staphylococcus species DETECTED (A) NOT DETECTED   Staphylococcus aureus (BCID) NOT DETECTED NOT DETECTED   Methicillin resistance NOT DETECTED NOT DETECTED   Streptococcus species NOT DETECTED NOT DETECTED   Streptococcus agalactiae NOT DETECTED NOT DETECTED   Streptococcus pneumoniae NOT DETECTED NOT DETECTED   Streptococcus pyogenes NOT DETECTED NOT DETECTED   Acinetobacter baumannii NOT DETECTED NOT DETECTED   Enterobacteriaceae species NOT DETECTED NOT DETECTED   Enterobacter cloacae complex NOT DETECTED NOT DETECTED   Escherichia coli NOT DETECTED NOT DETECTED   Klebsiella oxytoca NOT DETECTED NOT DETECTED   Klebsiella pneumoniae NOT DETECTED NOT DETECTED   Proteus species NOT DETECTED NOT DETECTED   Serratia marcescens NOT DETECTED NOT DETECTED   Haemophilus influenzae NOT DETECTED NOT DETECTED   Neisseria meningitidis NOT DETECTED NOT DETECTED   Pseudomonas aeruginosa NOT DETECTED NOT DETECTED   Candida albicans NOT DETECTED NOT DETECTED   Candida glabrata NOT DETECTED NOT DETECTED   Candida krusei NOT DETECTED NOT DETECTED   Candida parapsilosis NOT DETECTED NOT DETECTED   Candida  tropicalis NOT DETECTED NOT DETECTED    [name] 04/10/2018  10:17 AM

## 2018-04-10 NOTE — Consult Note (Addendum)
[facility]ious [facility]  Total days of antibiotics 2  Reason for Consult:sepsis due to staph species bacteremia, likely 2nd to left hip septic arthritis     Referring Physician: [email or web address]  Active Problems:   Septic arthritis ([place])   Pressure injury of skin    HPI: Elizabeth [name] is a 67 y.o. 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 She was given 2 course of oral steroids thinking this was related to inflammation with minimal improvement. She returned from a cruise on 11/26 with worsening symptoms unable to bear weight and essentially bed-bound. On 12/3 had MRI that was inconclusive for septic arthritis but her inflammatory markers were elevated with sed rate of 63. She noticed having night-sweats, chills/rigors, and fevers x 3-4d prior to admit. On admit, temp of 104F, encephalopathic, hypotensive, leukocytosis of 15K. Infectious work up shows 4/4 blood cx +staph species.   Her daughter reports her mother having new onset fever blister to lower lip. Patient also reports chest tightness, new. No chest pressure. Non-radiating.   Hx of PCN rash as a young child. No angioedema.  Past Medical History:  Diagnosis Date  . Aortic stenosis, mild 01/08/2015  . Hx of cancer of lung 6/10   Removed June 2010  . Hyperlipidemia   . Hypertension   . Low back pain   . Morbid obesity ([place]) 01/08/2015  . Right foot injury    report she sustained a fracture to her right , present with ortho boot on affected extremity   . Vitamin D deficiency     Allergies:  Allergies  Allergen Reactions  . Penicillins Other (See Comments)    Fever -when she was 67 yrs old Has patient had a PCN reaction causing immediate rash, facial/tongue/throat swelling, SOB or lightheadedness with hypotension: NO Has patient had a PCN reaction causing severe  rash involving mucus membranes or skin necrosis: Unknown Has patient had a PCN reaction that required hospitalization: Unknown Has patient had a PCN reaction occurring within the last 10 years: No Childhood allergy If all of the above answers are "NO", then may proceed with Cephalosporin use.     MEDICATIONS: . enoxaparin (LOVENOX) injection  40 mg Subcutaneous Q24H  . feeding supplement (PRO-STAT SUGAR FREE 64)  30 mL Oral BID  . iopamidol      . multivitamin with minerals  1 tablet Oral Daily    Social History   Tobacco Use  . Smoking status: Former Smoker    Types: Cigarettes    Last attempt to quit: 02/01/2005    Years since quitting: 13.1  . Smokeless tobacco: Never Used  Substance Use Topics  . Alcohol use: No    Alcohol/week: 0.0 standard drinks  . Drug use: No    Family History  Problem Relation Age of Onset  . Hypertension Mother   . Stroke Mother   . Cancer Mother        lung  . Heart disease Father        cabg  . Cancer Brother        skin    Review of Systems  Constitutional: positive for fever, chills, diaphoresis, activity change, appetite change, fatigue and unexpected weight change.  HENT: Negative for congestion, sore throat, rhinorrhea, sneezing, trouble swallowing and sinus pressure.  Eyes: Negative  for photophobia and visual disturbance.  Respiratory: Negative for cough, chest tightness, shortness of breath, wheezing and stridor.  Cardiovascular: Negative for chest pain, palpitations and leg swelling.  Gastrointestinal: Negative for nausea, vomiting, abdominal pain, diarrhea, constipation, blood in stool, abdominal distention and anal bleeding.  Genitourinary: Negative for dysuria, hematuria, flank pain and difficulty urinating.  Musculoskeletal: positive for myalgias, back pain, joint swelling, arthralgias and gait problem.  Skin: Negative for color change, pallor, rash and wound.  Neurological: Negative for dizziness, tremors, weakness and  light-headedness.  Hematological: Negative for adenopathy. Does not bruise/bleed easily.  Psychiatric/Behavioral: Negative for behavioral problems, confusion, sleep disturbance, dysphoric mood, decreased concentration and agitation.     OBJECTIVE: Temp:  [97.9 F (36.6 C)-101.5 F (38.6 C)] 99.5 F (37.5 C) (12/09 1200) Pulse Rate:  [71-122] 76 (12/09 1300) Resp:  [16-40] 23 (12/09 1300) BP: (73-163)/(29-142) 132/52 (12/09 1300) SpO2:  [88 %-100 %] 99 % (12/09 1300) Weight:  [111.5 kg] 111.5 kg (12/09 0300) Physical Exam  Constitutional:  oriented to person, place, and time. appears well-developed and well-nourished. No distress.  HENT: [place]/AT, PERRLA, no scleral icterus Mouth/Throat: Oropharynx is clear and moist. No oropharyngeal exudate.  Cardiovascular: Normal rate, regular rhythm and normal heart sounds. + systolic murmur BH USB Pulmonary/Chest: Effort normal and breath sounds normal. No respiratory distress.  has no wheezes.  Neck = supple, no nuchal rigidity Abdominal: Soft. Bowel sounds are normal.  exhibits no distension. There is no tenderness.  Lymphadenopathy: no cervical adenopathy. No axillary adenopathy Neurological: alert and oriented to person, place, and time.  Et: no erythema or swelling to right knee Skin: Skin is warm and dry. No rash noted. No erythema. Hyperpigmentation to ankles Psychiatric: a normal mood and affect.  behavior is normal.    LABS: Results for orders placed or performed during the hospital encounter of 04/09/18 (from the past 48 hour(s))  CBC with Differential     Status: Abnormal   Collection Time: 04/09/18  4:00 PM  Result Value Ref Range   WBC 15.4 (H) 4.0 - 10.5 K/uL   RBC 3.26 (L) 3.87 - 5.11 MIL/uL   Hemoglobin 9.7 (L) 12.0 - 15.0 g/dL   HCT 30.0 (L) 36.0 - 46.0 %   MCV 92.0 80.0 - 100.0 fL   MCH 29.8 26.0 - 34.0 pg   MCHC 32.3 30.0 - 36.0 g/dL   RDW 14.6 11.5 - 15.5 %   Platelets 83 (L) 150 - 400 K/uL    Comment: REPEATED TO  VERIFY SPECIMEN CHECKED FOR CLOTS Immature Platelet Fraction may be clinically indicated, consider ordering this additional test [id number]    nRBC 0.0 0.0 - 0.2 %   Neutrophils Relative % 89 %   Neutro Abs 13.5 (H) 1.7 - 7.7 K/uL   Lymphocytes Relative 6 %   Lymphs Abs 1.0 0.7 - 4.0 K/uL   Monocytes Relative 4 %   Monocytes Absolute 0.6 0.1 - 1.0 K/uL   Eosinophils Relative 0 %   Eosinophils Absolute 0.0 0.0 - 0.5 K/uL   Basophils Relative 0 %   Basophils Absolute 0.0 0.0 - 0.1 K/uL   Immature Granulocytes 1 %   Abs Immature Granulocytes 0.20 (H) 0.00 - 0.07 K/uL    Comment: Performed at [facility], [place] [address] [place], [place] [address]  Sedimentation rate     Status: Abnormal   Collection Time: 04/09/18  4:00 PM  Result Value Ref Range   Sed Rate 53 (H) 0 - 22 mm/hr  Comment: Performed at [facility], [place] [address] [place], [place] [address]  C-reactive protein     Status: Abnormal   Collection Time: 04/09/18  4:00 PM  Result Value Ref Range   CRP 17.7 (H) <1.0 mg/dL    Comment: Performed at [facility], [place] [address] [place], [place] [address]  Culture, blood (routine x 2)     Status: None (Preliminary result)   Collection Time: 04/09/18  4:01 PM  Result Value Ref Range   Specimen Description      BLOOD RIGHT ANTECUBITAL Performed at [facility], [place] [address], [place], [place] [address]    Special Requests      BOTTLES DRAWN AEROBIC AND ANAEROBIC Blood Culture adequate volume Performed at [place] [address] [place], [place] [address]    Culture  Setup Time      GRAM POSITIVE COCCI IN CLUSTERS IN BOTH AEROBIC AND ANAEROBIC BOTTLES CRITICAL RESULT CALLED TO, READ BACK BY AND VERIFIED WITH: PHAM ANH PHARMD AT 1000 ON [id number]  BY SJW Performed at [facility], [place] [address], [place], [place] [address]    Culture GRAM POSITIVE COCCI    Report Status PENDING    Blood Culture ID Panel (Reflexed)     Status: Abnormal   Collection Time: 04/09/18  4:01 PM  Result Value Ref Range   Enterococcus species NOT DETECTED NOT DETECTED   Listeria monocytogenes NOT DETECTED NOT DETECTED   Staphylococcus species DETECTED (A) NOT DETECTED    Comment: Methicillin (oxacillin) susceptible coagulase negative staphylococcus. Possible blood culture contaminant (unless isolated from more than one blood culture draw or clinical case suggests pathogenicity). No antibiotic treatment is indicated for blood  culture contaminants. CRITICAL RESULT CALLED TO, READ BACK BY AND VERIFIED WITH: PHAM ANH PHARMD AT 1000 ON [id number] BY SJW    Staphylococcus aureus (BCID) NOT DETECTED NOT DETECTED   Methicillin resistance NOT DETECTED NOT DETECTED   Streptococcus species NOT DETECTED NOT DETECTED   Streptococcus agalactiae NOT DETECTED NOT DETECTED   Streptococcus pneumoniae NOT DETECTED NOT DETECTED   Streptococcus pyogenes NOT DETECTED NOT DETECTED   Acinetobacter baumannii NOT DETECTED NOT DETECTED   Enterobacteriaceae species NOT DETECTED NOT DETECTED   Enterobacter cloacae complex NOT DETECTED NOT DETECTED   Escherichia coli NOT DETECTED NOT DETECTED   Klebsiella oxytoca NOT DETECTED NOT DETECTED   Klebsiella pneumoniae NOT DETECTED NOT DETECTED   Proteus species NOT DETECTED NOT DETECTED   Serratia marcescens NOT DETECTED NOT DETECTED   Haemophilus influenzae NOT DETECTED NOT DETECTED   Neisseria meningitidis NOT DETECTED NOT DETECTED   Pseudomonas aeruginosa NOT DETECTED NOT DETECTED   Candida albicans NOT DETECTED NOT DETECTED   Candida glabrata NOT DETECTED NOT DETECTED   Candida krusei NOT DETECTED NOT DETECTED   Candida parapsilosis NOT DETECTED NOT DETECTED   Candida tropicalis NOT DETECTED NOT DETECTED    Comment: Performed at [place] [address], [place], [place] [address]  Culture, blood (routine x 2)     Status: None (Preliminary result)    Collection Time: 04/09/18  4:06 PM  Result Value Ref Range   Specimen Description      BLOOD LEFT ANTECUBITAL Performed at [place] [address] [place], [place] [address]    Special Requests      BOTTLES DRAWN AEROBIC AND ANAEROBIC Blood Culture adequate volume Performed at [place] [address] [place],  [place] [address]    Culture  Setup Time      GRAM POSITIVE COCCI IN CLUSTERS IN BOTH AEROBIC AND ANAEROBIC BOTTLES CRITICAL VALUE NOTED.  VALUE IS CONSISTENT WITH PREVIOUSLY REPORTED AND CALLED VALUE. Performed at [facility], [place] [address], [place], [place] [address]    Culture GRAM POSITIVE COCCI    Report Status PENDING   I-Stat CG4 Lactic Acid, ED     Status: None   Collection Time: 04/09/18  4:18 PM  Result Value Ref Range   Lactic Acid, Venous 1.84 0.5 - 1.9 mmol/L  Urinalysis, Routine w reflex microscopic     Status: Abnormal   Collection Time: 04/09/18  5:39 PM  Result Value Ref Range   Color, Urine YELLOW YELLOW   APPearance CLEAR CLEAR   Specific Gravity, Urine 1.015 1.005 - 1.030   pH 5.0 5.0 - 8.0   Glucose, UA NEGATIVE NEGATIVE mg/dL   Hgb urine dipstick SMALL (A) NEGATIVE   Bilirubin Urine NEGATIVE NEGATIVE   Ketones, ur NEGATIVE NEGATIVE mg/dL   Protein, ur NEGATIVE NEGATIVE mg/dL   Nitrite NEGATIVE NEGATIVE   Leukocytes, UA NEGATIVE NEGATIVE   RBC / HPF 0-5 0 - 5 RBC/hpf   WBC, UA 6-10 0 - 5 WBC/hpf   Bacteria, UA RARE (A) NONE SEEN   Squamous Epithelial / LPF 0-5 0 - 5   Mucus PRESENT     Comment: Performed at [facility], [place] [address] [place], [place] [address]  Comprehensive metabolic panel     Status: Abnormal   Collection Time: 04/09/18  6:01 PM  Result Value Ref Range   Sodium 136 135 - 145 mmol/L   Potassium 3.5 3.5 - 5.1 mmol/L   Chloride 99 98 - 111 mmol/L   CO2 25 22 - 32 mmol/L   Glucose, Bld 104 (H) 70 - 99 mg/dL   BUN 49 (H) 8 - 23 mg/dL   Creatinine,  Ser 1.40 (H) 0.44 - 1.00 mg/dL   Calcium 7.9 (L) 8.9 - 10.3 mg/dL   Total Protein 6.1 (L) 6.5 - 8.1 g/dL   Albumin 2.7 (L) 3.5 - 5.0 g/dL   AST 33 15 - 41 U/L   ALT 25 0 - 44 U/L   Alkaline Phosphatase 72 38 - 126 U/L   Total Bilirubin 1.3 (H) 0.3 - 1.2 mg/dL   GFR calc non Af Amer 39 (L) >60 mL/min   GFR calc Af Amer 45 (L) >60 mL/min   Anion gap 12 5 - 15    Comment: Performed at [facility], [place] [address], [place], [place] [address]  Influenza panel by PCR (type A & B)     Status: None   Collection Time: 04/09/18  6:16 PM  Result Value Ref Range   Influenza A By PCR NEGATIVE NEGATIVE   Influenza B By PCR NEGATIVE NEGATIVE    Comment: (NOTE) The Xpert Xpress Flu assay is intended as an aid in the diagnosis of  influenza and should not be used as a sole basis for treatment.  This  assay is FDA approved for nasopharyngeal swab specimens only. Nasal  washings and aspirates are unacceptable for Xpert Xpress Flu testing. Performed at [facility], [place] [address], [place], [place] [address]   I-Stat CG4 Lactic Acid, ED     Status: None   Collection Time: 04/09/18  8:30 PM  Result Value Ref Range   Lactic Acid, Venous 1.30 0.5 - 1.9 mmol/L  HIV antibody (Routine Testing)     Status: None   Collection Time: 04/09/18  8:52 PM  Result Value Ref Range   HIV Screen 4th Generation wRfx Non Reactive Non Reactive    Comment: (NOTE) Performed At: [facility] [place], [place] [id number] Rush Farmer MD VO:[id number]   CBC     Status: Abnormal   Collection Time: 04/09/18  8:52 PM  Result Value Ref Range   WBC 16.3 (H) 4.0 - 10.5 K/uL   RBC 3.04 (L) 3.87 - 5.11 MIL/uL   Hemoglobin 9.0 (L) 12.0 - 15.0 g/dL   HCT 28.0 (L) 36.0 - 46.0 %   MCV 92.1 80.0 - 100.0 fL   MCH 29.6 26.0 - 34.0 pg   MCHC 32.1 30.0 - 36.0 g/dL   RDW 14.8 11.5 - 15.5 %   Platelets 65 (L) 150 - 400 K/uL    Comment: REPEATED TO VERIFY PLATELET COUNT  CONFIRMED BY SMEAR SPECIMEN CHECKED FOR CLOTS Immature Platelet Fraction may be clinically indicated, consider ordering this additional test [id number]    nRBC 0.0 0.0 - 0.2 %    Comment: Performed at [facility], [place] [address] [place], [place] [address]  Creatinine, serum     Status: Abnormal   Collection Time: 04/09/18  8:52 PM  Result Value Ref Range   Creatinine, Ser 1.54 (H) 0.44 - 1.00 mg/dL   GFR calc non Af Amer 34 (L) >60 mL/min   GFR calc Af Amer 40 (L) >60 mL/min    Comment: Performed at [facility], [place] [address], [place], [place] [address]  MRSA PCR Screening     Status: None   Collection Time: 04/09/18 10:41 PM  Result Value Ref Range   MRSA by PCR NEGATIVE NEGATIVE    Comment:        The GeneXpert MRSA Assay (FDA approved for NASAL specimens only), is one component of a comprehensive MRSA colonization surveillance program. It is not intended to diagnose MRSA infection nor to guide or monitor treatment for MRSA infections. Performed at [facility], [place] [address], [place], [place] [address]   CBC     Status: Abnormal   Collection Time: 04/10/18  3:21 AM  Result Value Ref Range   WBC 22.8 (H) 4.0 - 10.5 K/uL   RBC 3.50 (L) 3.87 - 5.11 MIL/uL   Hemoglobin 10.2 (L) 12.0 - 15.0 g/dL   HCT 32.5 (L) 36.0 - 46.0 %   MCV 92.9 80.0 - 100.0 fL   MCH 29.1 26.0 - 34.0 pg   MCHC 31.4 30.0 - 36.0 g/dL   RDW 14.8 11.5 - 15.5 %   Platelets 84 (L) 150 - 400 K/uL    Comment: REPEATED TO VERIFY Immature Platelet Fraction may be clinically indicated, consider ordering this additional test [id number] CONSISTENT WITH PREVIOUS RESULT    nRBC 0.0 0.0 - 0.2 %    Comment: Performed at [facility], [place] [address], [place],  [address]  Basic metabolic panel     Status: Abnormal   Collection Time: 04/10/18  3:21 AM  Result Value Ref Range   Sodium 139 135 - 145 mmol/L   Potassium 3.9 3.5 -  5.1 mmol/L   Chloride 105 98 - 111 mmol/L   CO2 25 22 - 32 mmol/L   Glucose, Bld 110 (H) 70 - 99 mg/dL   BUN 40 (H) 8 - 23 mg/dL   Creatinine, Ser 1.27 (H) 0.44 -  1.00 mg/dL   Calcium 7.8 (L) 8.9 - 10.3 mg/dL   GFR calc non Af Amer 43 (L) >60 mL/min   GFR calc Af Amer 50 (L) >60 mL/min   Anion gap 9 5 - 15    Comment: Performed at [facility], [place] [address] [place], [place] [address]    MICRO:  12/8 blood cx 4/4 staph species IMAGING: Dg Chest 2 View  Result Date: 04/09/2018 CLINICAL DATA:  Fever. Status post right knee replacement on November 11th. EXAM: CHEST - 2 VIEW COMPARISON:  CT chest dated 01/20/2018 FINDINGS: Trace right pleural effusion, chronic. Left lung is clear. No pneumothorax. Surgical clips in the right perihilar region. The heart is normal in size. Visualized osseous structures are within normal limits. IMPRESSION: Postsurgical changes right hemithorax with chronic trace right pleural effusion. No evidence of acute cardiopulmonary disease. Electronically Signed   By: Julian Hy M.D.   On: 04/09/2018 17:02   Dg Hip Unilat W Or Wo Pelvis 2-3 Views Left  Result Date: 04/09/2018 CLINICAL DATA:  Left hip pain. EXAM: DG HIP (WITH OR WITHOUT PELVIS) 2-3V LEFT COMPARISON:  Radiographs 1September 13, 202019 FINDINGS: Bilateral hip joint degenerative changes severe on the left and moderately severe on the right. There is joint space narrowing and osteophytic spurring. No definite fracture or AVN. The pubic symphysis and SI joints are intact. No pelvic fractures or bone lesions. IMPRESSION: Severe left and moderately severe right hip joint degenerative changes but no definite fracture or AVN. Electronically Signed   By: Marijo Sanes M.D.   On: 04/09/2018 17:00    Assessment/Plan:  67yo F with nearly 3-4 wk history of left hip pain following steroid injection and bedbound 2/2 pain x 10d admitted for sepsis found to have staph species bacteremia likely secondary to  left hip septic arthritis- and is suspected to be a complication of the injection 03/13/18  - agree with arthrocentesis, will get arthrocentesis , cell ct, cultures - await for ortho decision to see if she would benefit from I x D - reviewed pcn allergy, appears that she would tolerated cefazolin. Have started cefazolin 2gm iv q8hr. Can stop vancomycin - plan to treat for 4 wk - recommend getting TTE - will repeat blood cx today - anticipate that leukocytosis to improve, with abtx and source control.  - will check cxr to evaluate chesttightness

## 2018-04-10 NOTE — Progress Notes (Signed)
Initial Nutrition Assessment  DOCUMENTATION CODES:   Morbid obesity  INTERVENTION:  - Will order 30 mL Prostat BID, each supplement provides 100 kcal and 15 grams of protein. - Will order daily multivitamin with minerals. - Continue to encourage PO intakes with a focus on protein.    NUTRITION DIAGNOSIS:   Increased nutrient needs related to acute illness, wound healing as evidenced by estimated needs.  GOAL:   Patient will meet greater than or equal to 90% of their needs  MONITOR:   PO intake, Supplement acceptance, Weight trends, Labs, Skin  REASON FOR ASSESSMENT:   Other (Comment)(Pressure Injury report)  ASSESSMENT:   67 year old female with history of HTN and osteoarthritis. She received a steroid injection into L hip in mid-November and shortly after she started having severe L hip pain. She developed fever 1 week ago which resolved and recently returned. She presented to the ED with L hip pain and urinary frequency. She was found to be hypotensive and highly febrile with temperature of 105 degrees F.  Patient and RN, who is at bedside report patient is currently NPO and was unable to have breakfast. RN reports possible plan for OR today. Patient reports last meal was a Lean Cuisine-type meal 12/8 around 11 PM. Talked with her about the importance of protein intake for wound healing. Patient mainly focuses on animal-derived protein sources (gives the examples of beef, chicken, and salmon) and does not consistently eat plant-derived sources such as beans and nuts.   Per chart review, current weight is 246 lb. It appears that from 09/01/17-04/03/18 weight was 229-239 lb. Notes indicate patient received boluses of IV fluid after arrival to the ED and patient currently receiving high rate IV fluid.   Medications reviewed. Labs reviewed; BUN: 40 mg/dL, creatinine: 1.27 mg/dL, Ca: 7.8 mg/dL, GFR: 43 mL/min.  IVF; LR @ 125 mL/hr.    NUTRITION - FOCUSED PHYSICAL  EXAM:  Completed; no muscle and no fat wasting.   Diet Order:   Diet Order            Diet Heart Room service appropriate? Yes; Fluid consistency: Thin  Diet effective now              EDUCATION NEEDS:   Education needs have been addressed  Skin:  Skin Assessment: Skin Integrity Issues: Skin Integrity Issues:: Stage II Stage II: both sides of sacrum  Last BM:  12/7 (the day PTA)  Height:   Ht Readings from Last 1 Encounters:  04/09/18 5\' 4"  (1.626 m)    Weight:   Wt Readings from Last 1 Encounters:  04/10/18 111.5 kg    Ideal Body Weight:  54.54 kg  BMI:  Body mass index is 42.19 kg/m.  Estimated Nutritional Needs:   Kcal:  2010-2230 kcal  Protein:  120-130 grams  Fluid:  >/= 2 L/day     Jarome Matin, MS, RD, LDN, [facility] Inpatient Clinical Dietitian Pager # [phone] After hours/weekend pager # [phone]

## 2018-04-10 NOTE — Progress Notes (Signed)
eLink Physician-Brief Progress Note Patient Name: NIKIESHA MILFORD DOB: 11/21/1950 MRN: [id number]   Date of Service  04/10/2018  HPI/Events of Note  Hypotension - BP = 80/24. No CVL or CVP. Has received 4 L of crystalloid already.   eICU Interventions  Will order: 1. Phenylephrine IV infusion. Titrate to MAP >= 65.      Intervention Category Major Interventions: Hypotension - evaluation and management  Sommer,Steven Eugene 04/10/2018, 12:04 AM

## 2018-04-10 NOTE — Care Management Note (Signed)
Case Management Note  Patient Details  Name: Elizabeth [name] MRN: [id number] Date of Birth: Feb 22, 1951  Subjective/Objective:                  67 year old lady with history of hypertension who is known with osteoarthritis she received a steroid injection into her left hip middle of November and shortly after she started having some severe left hip pain she also developed some fever a week ago which has resolved but now she is having fever again she came in because of that and because of the left hip pain she denies any shortness of breath no chest pain no cough no sputum no dysuria but she does have urinary frequency.  Patient denies any abdominal pain no diarrhea no vomiting.  Patient was found to be hypotensive and highly febrile with temperature of 105 we will consulted for further management patient is receiving IV fluid boluses for systolic blood pressure of 82.  Patient received cefepime and vancomycin.  Action/Plan: Will follow for progression of care and clinical status. Will follow for case management needs none present at this time.  Expected Discharge Date:                  Expected Discharge Plan:  Home/Self Care  In-House Referral:     Discharge planning Services  CM Consult  Post Acute Care Choice:    Choice offered to:     DME Arranged:    DME Agency:     HH Arranged:    HH Agency:     Status of Service:  In process, will continue to follow  If discussed at Long Length of Stay Meetings, dates discussed:    Additional Comments:  Leeroy Cha, RN 04/10/2018, 9:31 AM

## 2018-04-10 NOTE — Progress Notes (Signed)
NAME:  Elizabeth [name], MRN:  [id number], DOB:  1950/07/13, LOS: 1 ADMISSION DATE:  04/09/2018, CONSULTATION DATE:  04/10/2018 REFERRING MD:  EDP, CHIEF COMPLAINT:  Left hip pain    Brief History   67 y/o female with osteoarthritis of the left hip who underwent an injection of the same on 11/11 developed left hip pain on 11/14 then septic shock with worsening left hip pain on 12/9 and was admitted to [facility].     Past Medical History  Morbid obesity, hypertension, lung cancer resected 2010, aortic stenosis  Significant Hospital Events   12/8 admission  Consults:  12/8 ortho  Procedures:  12/9 left hip arthrocentesis  Significant Diagnostic Tests:    Micro Data:  12/8 blood>  staph species 4/4  Antimicrobials:  12/8  vanc >  12/8 cefepime >  12/9  Interim history/subjective:  Feels better this morning than she did yesterday, wants to eat   Objective   Blood pressure (!) 130/32, pulse 82, temperature 98.9 F (37.2 C), temperature source Oral, resp. rate (!) 22, height 5\' 4"  (1.626 m), weight 111.5 kg, SpO2 97 %.        Intake/Output Summary (Last 24 hours) at 04/10/2018 1123 Last data filed at 04/10/2018 0800 Gross per 24 hour  Intake 4867.61 ml  Output 2000 ml  Net 2867.61 ml   Filed Weights   04/09/18 2230 04/10/18 0300  Weight: 111.5 kg 111.5 kg    Examination:  General:  Morbidly obese, resting comfortably in bed HENT: NCAT OP clear PULM: CTA B, normal effort CV: RRR, no mgr GI: BS+, soft, nontender MSK: normal bulk and tone, left hip tender Neuro: awake, alert, no distress, MAEW Derm: warm, cap refill normal, pulses intact   Resolved Hospital Problem list     Assessment & Plan:  Septic shock: now adequately volume resuscitated, still requiring low dose vasopressors > continue neosynephrine for MAP > 65  Staph bacteremia: presumable source is the left hip > arthrocentesis today > will consult ID > continue vanc for now, will d/c cefepime > will  need echo, will order TTE for now, discuss with ID if need TEE  Left hip pain:  > APAP prn > vicodin prn  Hypertension baseline > hold home benicar hctz for now   Best practice:  Diet: regular diet DVT prophylaxis: enoxaparin GI prophylaxis: n/a Glucose control: n/a Mobility: discuss with ortho Code Status: full Family Communication: updated daughter bedside Disposition: remain in ICU  Labs   CBC: Recent Labs  Lab 04/04/18 0133 04/09/18 1600 04/09/18 2052 04/10/18 0321  WBC 10.1 15.4* 16.3* 22.8*  NEUTROABS 8.5* 13.5*  --   --   HGB 10.5* 9.7* 9.0* 10.2*  HCT 32.6* 30.0* 28.0* 32.5*  MCV 92.9 92.0 92.1 92.9  PLT 136* 83* 65* 84*    Basic Metabolic Panel: Recent Labs  Lab 04/04/18 0133 04/09/18 1801 04/09/18 2052 04/10/18 0321  NA 135 136  --  139  K 3.9 3.5  --  3.9  CL 96* 99  --  105  CO2 29 25  --  25  GLUCOSE 162* 104*  --  110*  BUN 31* 49*  --  40*  CREATININE 1.27* 1.40* 1.54* 1.27*  CALCIUM 8.5* 7.9*  --  7.8*   GFR: Estimated Creatinine Clearance: 51.8 mL/min (A) (by C-G formula based on SCr of 1.27 mg/dL (H)). Recent Labs  Lab 04/04/18 0133 04/09/18 1600 04/09/18 1618 04/09/18 2030 04/09/18 2052 04/10/18 0321  WBC 10.1 15.4*  --   --  16.3* 22.8*  LATICACIDVEN  --   --  1.84 1.30  --   --     Liver Function Tests: Recent Labs  Lab 04/09/18 1801  AST 33  ALT 25  ALKPHOS 72  BILITOT 1.3*  PROT 6.1*  ALBUMIN 2.7*   No results for input(s): LIPASE, AMYLASE in the last 168 hours. No results for input(s): AMMONIA in the last 168 hours.  ABG    Component Value Date/Time   PHART 7.305 (L) 10/19/2008 0354   PCO2ART 57.0 (H) 10/19/2008 0354   PO2ART 85.0 10/19/2008 0354   HCO3 28.2 (H) 10/19/2008 0354   TCO2 30 10/19/2008 0354   O2SAT 95.0 10/19/2008 0354     Coagulation Profile: No results for input(s): INR, PROTIME in the last 168 hours.  Cardiac Enzymes: No results for input(s): CKTOTAL, CKMB, CKMBINDEX, TROPONINI in  the last 168 hours.  HbA1C: Hgb A1c MFr Bld  Date/Time Value Ref Range Status  11/23/2017 11:17 AM 5.5 4.6 - 6.5 % Final    Comment:    Glycemic Control Guidelines for People with Diabetes:Non Diabetic:  <6%Goal of Therapy: <7%Additional Action Suggested:  >8%   01/05/2017 11:31 AM 5.5 4.8 - 5.6 % Final    Comment:             Prediabetes: 5.7 - 6.4          Diabetes: >6.4          Glycemic control for adults with diabetes: <7.0     CBG: No results for input(s): GLUCAP in the last 168 hours.     Critical care time: 45 minutes       Roselie Awkward, MD [place] Pager: [phone] Cell: [phone] If no response, call [phone]

## 2018-04-11 ENCOUNTER — Other Ambulatory Visit (HOSPITAL_COMMUNITY): Payer: Self-pay

## 2018-04-11 DIAGNOSIS — R7881 Bacteremia: Secondary | ICD-10-CM

## 2018-04-11 LAB — CBC WITH DIFFERENTIAL/PLATELET
Abs Immature Granulocytes: 0.27 10*3/uL — ABNORMAL HIGH (ref 0.00–0.07)
Basophils Absolute: 0 10*3/uL (ref 0.0–0.1)
Basophils Relative: 0 %
Eosinophils Absolute: 0.1 10*3/uL (ref 0.0–0.5)
Eosinophils Relative: 1 %
HCT: 28.1 % — ABNORMAL LOW (ref 36.0–46.0)
Hemoglobin: 8.6 g/dL — ABNORMAL LOW (ref 12.0–15.0)
Immature Granulocytes: 2 %
LYMPHS ABS: 1.6 10*3/uL (ref 0.7–4.0)
Lymphocytes Relative: 10 %
MCH: 29.2 pg (ref 26.0–34.0)
MCHC: 30.6 g/dL (ref 30.0–36.0)
MCV: 95.3 fL (ref 80.0–100.0)
Monocytes Absolute: 1.2 10*3/uL — ABNORMAL HIGH (ref 0.1–1.0)
Monocytes Relative: 7 %
NRBC: 0 % (ref 0.0–0.2)
Neutro Abs: 13.8 10*3/uL — ABNORMAL HIGH (ref 1.7–7.7)
Neutrophils Relative %: 80 %
Platelets: 65 10*3/uL — ABNORMAL LOW (ref 150–400)
RBC: 2.95 MIL/uL — ABNORMAL LOW (ref 3.87–5.11)
RDW: 14.8 % (ref 11.5–15.5)
WBC: 17.1 10*3/uL — ABNORMAL HIGH (ref 4.0–10.5)

## 2018-04-11 LAB — COMPREHENSIVE METABOLIC PANEL
ALK PHOS: 53 U/L (ref 38–126)
ALT: 13 U/L (ref 0–44)
ANION GAP: 9 (ref 5–15)
AST: 25 U/L (ref 15–41)
Albumin: 2.2 g/dL — ABNORMAL LOW (ref 3.5–5.0)
BUN: 15 mg/dL (ref 8–23)
CO2: 22 mmol/L (ref 22–32)
Calcium: 7.6 mg/dL — ABNORMAL LOW (ref 8.9–10.3)
Chloride: 105 mmol/L (ref 98–111)
Creatinine, Ser: 0.78 mg/dL (ref 0.44–1.00)
GFR calc Af Amer: >60 mL/min (ref >60)
GFR calc non Af Amer: >60 mL/min (ref >60)
Glucose, Bld: 149 mg/dL — ABNORMAL HIGH (ref 70–99)
Potassium: 4.2 mmol/L (ref 3.5–5.1)
SODIUM: 136 mmol/L (ref 135–145)
Total Bilirubin: 1.3 mg/dL — ABNORMAL HIGH (ref 0.3–1.2)
Total Protein: 5.7 g/dL — ABNORMAL LOW (ref 6.5–8.1)

## 2018-04-11 LAB — BASIC METABOLIC PANEL
Anion gap: 8 (ref 5–15)
BUN: 19 mg/dL (ref 8–23)
CO2: 24 mmol/L (ref 22–32)
Calcium: 7.6 mg/dL — ABNORMAL LOW (ref 8.9–10.3)
Chloride: 106 mmol/L (ref 98–111)
Creatinine, Ser: 0.87 mg/dL (ref 0.44–1.00)
GFR calc non Af Amer: >60 mL/min (ref >60)
Glucose, Bld: 111 mg/dL — ABNORMAL HIGH (ref 70–99)
Potassium: 3.2 mmol/L — ABNORMAL LOW (ref 3.5–5.1)
Sodium: 138 mmol/L (ref 135–145)

## 2018-04-11 LAB — CORTISOL: Cortisol, Plasma: 13.9 ug/dL

## 2018-04-11 MED ORDER — HYDROCORTISONE NA SUCCINATE PF 100 MG IJ SOLR
50.0000 mg | Freq: Four times a day (QID) | INTRAMUSCULAR | Status: DC
  Administered 2018-04-11 – 2018-04-18 (×25): 50 mg via INTRAVENOUS
  Filled 2018-04-11 (×25): qty 2

## 2018-04-11 MED ORDER — POTASSIUM CHLORIDE CRYS ER 20 MEQ PO TBCR
40.0000 meq | EXTENDED_RELEASE_TABLET | Freq: Once | ORAL | Status: AC
  Administered 2018-04-11: 40 meq via ORAL
  Filled 2018-04-11: qty 2

## 2018-04-11 MED ORDER — FENTANYL CITRATE (PF) 100 MCG/2ML IJ SOLN
25.0000 ug | INTRAMUSCULAR | Status: DC | PRN
  Administered 2018-04-13: 25 ug via INTRAVENOUS
  Filled 2018-04-11 (×2): qty 2

## 2018-04-11 NOTE — Progress Notes (Signed)
ID Progress Note  67 yo P/W sepsis found to have staph lugdunensis bacteremia likely possibly from left hip septic arthritis  -continue on cefazolin 2gm IV Q 8hr - will repeat blood cx today - await TTE results

## 2018-04-11 NOTE — Progress Notes (Addendum)
NAME:  Elizabeth [name], MRN:  [id number], DOB:  1950-09-06, LOS: 2 ADMISSION DATE:  04/09/2018, CONSULTATION DATE:  04/10/2018 REFERRING MD:  EDP, CHIEF COMPLAINT:  Left hip pain    Brief History   67 y/o female with osteoarthritis of the left hip who underwent an injection of the same on 11/11 developed left hip pain on 11/14 then septic shock with worsening left hip pain on 12/9 and was admitted to [facility].    Past Medical History  Morbid obesity, hypertension, lung cancer resected 2010, aortic stenosis  Significant Hospital Events   12/8 admission  Consults:  12/8 Ortho  Procedures:  12/9 left hip arthrocentesis  Significant Diagnostic Tests:  TTE 12/10 >>   Micro Data:  BCx4 12/8 >> staph legdunesis 4/4 Body fluid culture 12/9 >>   Antimicrobials:  Cefepime 12/8 >> 12/9 Flagyl 12/8 >> 12/9  Vanco 12/8 >> 12/10 Cefazolin 12/9 >>  Interim history/subjective:  Tmax 100.8.  I/O- 5.8L positive for admit / 3.1 L+ in last 24 hours.  Remains on neosynephrine at 5mcg's (increased overnight). Pt denies pain.   Objective   Blood pressure 117/61, pulse 77, temperature 99.4 F (37.4 C), temperature source Oral, resp. rate (!) 22, height 5\' 4"  (1.626 m), weight 111.5 kg, SpO2 100 %.        Intake/Output Summary (Last 24 hours) at 04/11/2018 1133 Last data filed at 04/11/2018 1011 Gross per 24 hour  Intake 3779.96 ml  Output 1150 ml  Net 2629.96 ml   Filed Weights   04/09/18 2230 04/10/18 0300  Weight: 111.5 kg 111.5 kg    Examination: General: obese female lying in bed asleep   HEENT: MM pink/moist, no jvd Neuro: Awakens to voice, speech clear, appropriate CV: s1s2 rrr, ?soft murmur, ~2/6 RSB 2nd ICS PULM: even/non-labored, lungs bilaterally clear YJ:EHUD, non-tender, bsx4 active  Extremities: warm/dry, trace to 1+ BLE pitting edema.  No evidence of left hip erythema/warmth.   Skin: no rashes or lesions   Resolved Hospital Problem list     Assessment & Plan:    Septic Shock -adequately volume resuscitated, still requiring low dose vasopressors P: Wean neo to off for MAP >65 Concern BP cuff is not accurate, pt mental status normal, making urine, not tachycardic  Assess cortisol (recent steroids) ICU monitoring  Continue LR at 196ml/hr  Staph bacteremia -presumable source is the left hip P: Await arthrocentesis cultures ID following, appreciate input  Discontinue vancomycin TTE ordered   Left hip pain P: PRN tylenol  Vicodin PRN  Ortho saw 12/8  Hypertension -baseline P: Hold home benicar/hctz   Hypokalemia  P: Monitor, replace as indicated   Best practice:  Diet: regular diet DVT prophylaxis: enoxaparin GI prophylaxis: n/a Glucose control: n/a Mobility: Per Ortho Code Status: full Family Communication: updated daughter bedside Disposition: remain in ICU  Labs   CBC: Recent Labs  Lab 04/09/18 1600 04/09/18 2052 04/10/18 0321 04/11/18 0313  WBC 15.4* 16.3* 22.8* 17.1*  NEUTROABS 13.5*  --   --  13.8*  HGB 9.7* 9.0* 10.2* 8.6*  HCT 30.0* 28.0* 32.5* 28.1*  MCV 92.0 92.1 92.9 95.3  PLT 83* 65* 84* 65*    Basic Metabolic Panel: Recent Labs  Lab 04/09/18 1801 04/09/18 2052 04/10/18 0321 04/11/18 0313  NA 136  --  139 138  K 3.5  --  3.9 3.2*  CL 99  --  105 106  CO2 25  --  25 24  GLUCOSE 104*  --  110* 111*  BUN 49*  --  40* 19  CREATININE 1.40* 1.54* 1.27* 0.87  CALCIUM 7.9*  --  7.8* 7.6*   GFR: Estimated Creatinine Clearance: 75.6 mL/min (by C-G formula based on SCr of 0.87 mg/dL). Recent Labs  Lab 04/09/18 1600 04/09/18 1618 04/09/18 2030 04/09/18 2052 04/10/18 0321 04/11/18 0313  WBC 15.4*  --   --  16.3* 22.8* 17.1*  LATICACIDVEN  --  1.84 1.30  --   --   --     Liver Function Tests: Recent Labs  Lab 04/09/18 1801  AST 33  ALT 25  ALKPHOS 72  BILITOT 1.3*  PROT 6.1*  ALBUMIN 2.7*   No results for input(s): LIPASE, AMYLASE in the last 168 hours. No results for input(s):  AMMONIA in the last 168 hours.  ABG    Component Value Date/Time   PHART 7.305 (L) 10/19/2008 0354   PCO2ART 57.0 (H) 10/19/2008 0354   PO2ART 85.0 10/19/2008 0354   HCO3 28.2 (H) 10/19/2008 0354   TCO2 30 10/19/2008 0354   O2SAT 95.0 10/19/2008 0354     Coagulation Profile: No results for input(s): INR, PROTIME in the last 168 hours.  Cardiac Enzymes: No results for input(s): CKTOTAL, CKMB, CKMBINDEX, TROPONINI in the last 168 hours.  HbA1C: Hgb A1c MFr Bld  Date/Time Value Ref Range Status  11/23/2017 11:17 AM 5.5 4.6 - 6.5 % Final    Comment:    Glycemic Control Guidelines for People with Diabetes:Non Diabetic:  <6%Goal of Therapy: <7%Additional Action Suggested:  >8%   01/05/2017 11:31 AM 5.5 4.8 - 5.6 % Final    Comment:             Prediabetes: 5.7 - 6.4          Diabetes: >6.4          Glycemic control for adults with diabetes: <7.0     CBG: No results for input(s): GLUCAP in the last 168 hours.     Critical care time: 30 minutes     Noe Gens, NP-C [place] [facility] Pgr: [phone] or if no answer [phone] 04/11/2018, 11:59 AM

## 2018-04-11 NOTE — Progress Notes (Signed)
[facility] Infusion Coordinator will follow pt with ID team to support Home Infusion Pharm[facility] services as ordered at DC.  If patient discharges after hours, please call [phone].   Larry Sierras 04/11/2018, 9:05 AM

## 2018-04-11 NOTE — Progress Notes (Signed)
eLink Physician-Brief Progress Note Patient Name: Elizabeth [name] DOB: Dec 13, 1950 MRN: [id number]   Date of Service  04/11/2018  HPI/Events of Note  Noted more frequent PVCs. K3.2 in the am and was repleted.  Pt complaining of discomfort when sitting or staying at same position for very long.   eICU Interventions  Check CMP and replete electrolytes as needed.  Pt has pain meds ordered prn.         Elsie Lincoln 04/11/2018, 9:42 PM

## 2018-04-11 NOTE — Care Management Note (Signed)
Case Management Note  Patient Details  Name: Elizabeth [name] MRN: [id number] Date of Birth: December 20, 1950  Subjective/Objective:                  bld cultures + staph,wbc=17.1 hgb 8.6,iv NCEF,IV NEO, IV VANCOMYCIN,IV LR AT 125CC/HR  Action/Plan: Will follow for progression of care and clinical status. Will follow for case management needs none present at this time.  Expected Discharge Date:                  Expected Discharge Plan:  Home/Self Care  In-House Referral:     Discharge planning Services  CM Consult  Post Acute Care Choice:    Choice offered to:     DME Arranged:    DME Agency:     HH Arranged:    HH Agency:     Status of Service:  In process, will continue to follow  If discussed at Long Length of Stay Meetings, dates discussed:    Additional Comments:  Leeroy Cha, RN 04/11/2018, 9:21 AM

## 2018-04-12 ENCOUNTER — Encounter (HOSPITAL_COMMUNITY): Payer: Self-pay | Admitting: Cardiology

## 2018-04-12 ENCOUNTER — Inpatient Hospital Stay: Payer: Self-pay

## 2018-04-12 ENCOUNTER — Inpatient Hospital Stay (HOSPITAL_COMMUNITY): Payer: Medicare Other

## 2018-04-12 DIAGNOSIS — I471 Supraventricular tachycardia: Secondary | ICD-10-CM

## 2018-04-12 DIAGNOSIS — I499 Cardiac arrhythmia, unspecified: Secondary | ICD-10-CM

## 2018-04-12 DIAGNOSIS — I340 Nonrheumatic mitral (valve) insufficiency: Secondary | ICD-10-CM

## 2018-04-12 DIAGNOSIS — I361 Nonrheumatic tricuspid (valve) insufficiency: Secondary | ICD-10-CM

## 2018-04-12 DIAGNOSIS — I700 Atherosclerosis of aorta: Secondary | ICD-10-CM

## 2018-04-12 DIAGNOSIS — L89301 Pressure ulcer of unspecified buttock, stage 1: Secondary | ICD-10-CM

## 2018-04-12 LAB — BASIC METABOLIC PANEL
Anion gap: 9 (ref 5–15)
BUN: 17 mg/dL (ref 8–23)
CO2: 22 mmol/L (ref 22–32)
Calcium: 7.4 mg/dL — ABNORMAL LOW (ref 8.9–10.3)
Chloride: 106 mmol/L (ref 98–111)
Creatinine, Ser: 0.75 mg/dL (ref 0.44–1.00)
GFR calc Af Amer: >60 mL/min (ref >60)
GFR calc non Af Amer: >60 mL/min (ref >60)
Glucose, Bld: 147 mg/dL — ABNORMAL HIGH (ref 70–99)
Potassium: 4 mmol/L (ref 3.5–5.1)
Sodium: 137 mmol/L (ref 135–145)

## 2018-04-12 LAB — TSH: TSH: 1.031 u[IU]/mL (ref 0.350–4.500)

## 2018-04-12 LAB — CBC
HEMATOCRIT: 29.8 % — AB (ref 36.0–46.0)
Hemoglobin: 8.9 g/dL — ABNORMAL LOW (ref 12.0–15.0)
MCH: 29.9 pg (ref 26.0–34.0)
MCHC: 29.9 g/dL — ABNORMAL LOW (ref 30.0–36.0)
MCV: 100 fL (ref 80.0–100.0)
NRBC: 0 % (ref 0.0–0.2)
Platelets: 68 10*3/uL — ABNORMAL LOW (ref 150–400)
RBC: 2.98 MIL/uL — ABNORMAL LOW (ref 3.87–5.11)
RDW: 15 % (ref 11.5–15.5)
WBC: 21.3 10*3/uL — ABNORMAL HIGH (ref 4.0–10.5)

## 2018-04-12 LAB — ECHOCARDIOGRAM LIMITED
HEIGHTINCHES: 64 in
Weight: 3933.01 oz

## 2018-04-12 LAB — TROPONIN I
TROPONIN I: 0.08 ng/mL — AB (ref <0.03)
Troponin I: 0.09 ng/mL (ref <0.03)

## 2018-04-12 LAB — MAGNESIUM: Magnesium: 1.8 mg/dL (ref 1.7–2.4)

## 2018-04-12 LAB — GLUCOSE, CAPILLARY: Glucose-Capillary: 188 mg/dL — ABNORMAL HIGH (ref 70–99)

## 2018-04-12 MED ORDER — ORAL CARE MOUTH RINSE
15.0000 mL | Freq: Two times a day (BID) | OROMUCOSAL | Status: DC
  Administered 2018-04-12 – 2018-04-20 (×12): 15 mL via OROMUCOSAL

## 2018-04-12 MED ORDER — SODIUM CHLORIDE 0.9 % IV BOLUS
1000.0000 mL | Freq: Once | INTRAVENOUS | Status: AC
  Administered 2018-04-12: 1000 mL via INTRAVENOUS

## 2018-04-12 MED ORDER — AMIODARONE IV BOLUS ONLY 150 MG/100ML
150.0000 mg | Freq: Once | INTRAVENOUS | Status: AC
  Administered 2018-04-12: 150 mg via INTRAVENOUS
  Filled 2018-04-12: qty 100

## 2018-04-12 MED ORDER — AMIODARONE HCL IN DEXTROSE 360-4.14 MG/200ML-% IV SOLN
30.0000 mg/h | INTRAVENOUS | Status: DC
  Administered 2018-04-12 – 2018-04-18 (×8): 30 mg/h via INTRAVENOUS
  Filled 2018-04-12 (×13): qty 200

## 2018-04-12 MED ORDER — AMIODARONE HCL IN DEXTROSE 360-4.14 MG/200ML-% IV SOLN
60.0000 mg/h | INTRAVENOUS | Status: AC
  Administered 2018-04-12 (×2): 60 mg/h via INTRAVENOUS
  Filled 2018-04-12: qty 200

## 2018-04-12 NOTE — Progress Notes (Signed)
Spoke to primary Rn concerning PICC placement.She was informed that we would like to wait 48-72 hour after blood culture resulted.Blood culture drawn yesterday the patient have fever of 101.3 F orally at 2030 last night.Rn to spoke  MD for another access choices.

## 2018-04-12 NOTE — Progress Notes (Signed)
CRITICAL VALUE ALERT  Critical Value: Troponin 0.09  Date & Time Notied:  04/12/18 0945  Provider Notified: [email or web address]  Orders Received/Actions taken: aware

## 2018-04-12 NOTE — Progress Notes (Signed)
Subjective: Patient resting in bed with mild hip pain at rest. She is alert and in good spirits   Objective: Vital signs in last 24 hours: Temp:  [97.6 F (36.4 C)-101.3 F (38.5 C)] 97.6 F (36.4 C) (12/11 1606) Pulse Rate:  [41-167] 76 (12/11 1734) Resp:  [0-32] 24 (12/11 1734) BP: (69-122)/(28-93) 101/47 (12/11 1734) SpO2:  [95 %-100 %] 99 % (12/11 1734)  Intake/Output from previous day: 12/10 0701 - 12/11 0700 In: 5339.8 [I.V.:3351.6; IV Piggyback:1988.2] Out: 1050 [Urine:1050] Intake/Output this shift: Total I/O In: 1532.1 [P.O.:120; I.V.:1212.1; IV Piggyback:200] Out: 300 [Urine:300]  Recent Labs    04/09/18 2052 04/10/18 0321 04/11/18 0313 04/12/18 0236  HGB 9.0* 10.2* 8.6* 8.9*   Recent Labs    04/11/18 0313 04/12/18 0236  WBC 17.1* 21.3*  RBC 2.95* 2.98*  HCT 28.1* 29.8*  PLT 65* 68*   Recent Labs    04/11/18 2223 04/12/18 0236  NA 136 137  K 4.2 4.0  CL 105 106  CO2 22 22  BUN 15 17  CREATININE 0.78 0.75  GLUCOSE 149* 147*  CALCIUM 7.6* 7.4*   No results for input(s): LABPT, INR in the last 72 hours.  Right knee shows no warmth, tenderness or effusion. No pain on range of motion. Left knee no effusion or warmth. Left hip no warmth or swelling. Has pain on ROM but not exquisitely painful.    Scans and labs reviewed. Had negative aspirate of hip 2 days ago. MRI was reviewed in my office and there was no effusion. She had changes consistent with a rapidly progressive OA which would also account for her significant hip pain   Assessment/Plan: Bacteremia/sepsis- Her knees look fine and are definitely not the source of infection. Her timeline concerning her left hip pain is certainly suggestive of possible septic arthritis but she has had a negative MRI showing no signs of infection and a negative joint aspiration, both of which suggest that she does not have septic arthritis. In addition she is not having much rest pain in her hip, which would also  suggest that the joint is not the source of her infection. If a source is not identified in the next few days and she is not improving clinically, may want to repeat the hip MRI to see if she has developed an effusion.   Elizabeth [name] 04/12/2018, 5:56 PM

## 2018-04-12 NOTE — Progress Notes (Signed)
Spoke to primary RN regarding PICC;MD wants the PICC tomorrow.

## 2018-04-12 NOTE — Progress Notes (Signed)
  Echocardiogram 2D Echocardiogram has been performed.  Madelaine Etienne 04/12/2018, 1:48 PM

## 2018-04-12 NOTE — Progress Notes (Addendum)
    [facility] for Infectious Disease    Date of Admission:  04/09/2018   Total days of antibiotics 4        Day 3 cefazolin   ID: Elizabeth [name] is a 67 y.o. female with  Staph ludgunensis bacteremia thought to be 2ndary to septic arthritis of left hip Active Problems:   Septic arthritis ([place])   Pressure injury of skin    Subjective: Felt poorly this morning, little sleep due to having symptomatic tachycardia. Felt difficulty taking deep breath with palpitations. She was started on amiodarone and low dose pressors due to sBP 70; febrile to 101 yesterday  Medications:  . enoxaparin (LOVENOX) injection  40 mg Subcutaneous Q24H  . feeding supplement (PRO-STAT SUGAR FREE 64)  30 mL Oral BID  . hydrocortisone sodium succinate  50 mg Intravenous Q6H  . multivitamin with minerals  1 tablet Oral Daily    Objective: Vital signs in last 24 hours: Temp:  [97.6 F (36.4 C)-101.3 F (38.5 C)] 97.6 F (36.4 C) (12/11 1606) Pulse Rate:  [41-167] 76 (12/11 1734) Resp:  [0-32] 24 (12/11 1734) BP: (69-122)/(28-93) 101/47 (12/11 1734) SpO2:  [95 %-100 %] 99 % (12/11 1734) Physical Exam  Constitutional:  oriented to person, place, and time. appears chronically ill and well-nourished. No distress.  HENT: [place]/AT, PERRLA, no scleral icterus Mouth/Throat: Oropharynx is clear and moist. No oropharyngeal exudate.  Cardiovascular: tachy, regular rhythm and normal heart sounds.  No murmur heard.  Pulmonary/Chest: Effort normal and breath sounds normal. No respiratory distress.  has no wheezes.  Neck = supple, no nuchal rigidity Abdominal: Soft. Bowel sounds are normal.  exhibits no distension. There is no tenderness.  Lymphadenopathy: no cervical adenopathy. No axillary adenopathy Neurological: alert and oriented to person, place, and time.  Skin: Skin is warm and dry. No rash noted. No erythema.  Psychiatric: a normal mood and affect.  behavior is normal.    Lab Results Recent Labs   04/11/18 0313 04/11/18 2223 04/12/18 0236  WBC 17.1*  --  21.3*  HGB 8.6*  --  8.9*  HCT 28.1*  --  29.8*  NA 138 136 137  K 3.2* 4.2 4.0  CL 106 105 106  CO2 24 22 22   BUN 19 15 17   CREATININE 0.87 0.78 0.75   Liver Panel Recent Labs    04/11/18 2223  PROT 5.7*  ALBUMIN 2.2*  AST 25  ALT 13  ALKPHOS 53  BILITOT 1.3*    Microbiology: 12/8 reincubating -synovial fluid 12/8 blood cx staph ludgunensis 12/10 blood cx pending Studies/Results: [place] Ekg Site Rite  Result Date: 04/12/2018 If Site Rite image not attached, placement could not be confirmed due to current cardiac rhythm.    Assessment/Plan: Sepsis due to staph ludgunensis bacteremia with likely septic arthritis which is suspected to be a complication of the injection 03/13/18= S.ludgunensis is thought to be as virulent at staph aureus. Will likely need wash out of left hip once she is clinically stable. For now continue on cefazolin. TTE showing calcified AV - suspect this is old. May consider getting TEE once clinically stable.  Arrhythmia/SVT = fevers likely driving her tachycardia. ? Afib. Continue on amiodarone and BP support. Defer to cardiology for management  [facility] Cell: [phone] Pager: [phone]  04/12/2018, 6:04 PM

## 2018-04-12 NOTE — Progress Notes (Signed)
Noe Gens, NP to speak to IV team regarding proceding with PICC placement due to limited access and ability to draw labs.

## 2018-04-12 NOTE — Progress Notes (Addendum)
NAME:  Elizabeth [name], MRN:  [id number], DOB:  1951-03-18, LOS: 3 ADMISSION DATE:  04/09/2018, CONSULTATION DATE:  04/10/2018 REFERRING MD:  EDP, CHIEF COMPLAINT:  Left hip pain    Brief History   67 y/o female with osteoarthritis of the left hip who underwent an injection of the same on 11/11 (per IR) developed left hip pain on 11/14.  [facility] shock with worsening left hip pain on 12/9 and was admitted to [facility].  Blood cultures grew staph lugdunesis in 4/4 bottles.  The left hip was aspirated and cultures to date are negative.  Repeat blood cultures are pending.   Past Medical History  Morbid obesity, hypertension, lung cancer resected 2010, aortic stenosis.  Right knee replacement.  Recent (~August 2019?) steroids for   Significant Hospital Events   12/08 Admission 12/10 Increased vasopressor requirements, lost IV access.  AI > stress dose steroids started  Consults:  12/8 Ortho  Procedures:  12/9 Left hip arthrocentesis  Significant Diagnostic Tests:  TTE 12/10 >>   Micro Data:  BCx4 12/8 >> staph legdunesis 4/4 Body fluid culture 12/9 >> rare wbc's >>  Antimicrobials:  Cefepime 12/8 >> 12/9 Flagyl 12/8 >> 12/9  Vanco 12/8 >> 12/10 Cefazolin 12/9 >>  Interim history/subjective:  Tmax 101.3.  SVT overnight, amiodarone initiated.  Limited IV access.    Objective   Blood pressure (!) 113/93, pulse 81, temperature 97.8 F (36.6 C), temperature source Oral, resp. rate (!) 0, height 5\' 4"  (1.626 m), weight 111.5 kg, SpO2 98 %.        Intake/Output Summary (Last 24 hours) at 04/12/2018 1021 Last data filed at 04/12/2018 1000 Gross per 24 hour  Intake 5781.46 ml  Output 900 ml  Net 4881.46 ml   Filed Weights   04/09/18 2230 04/10/18 0300  Weight: 111.5 kg 111.5 kg    Examination: General: adult female lying in bed in NAD, daughter at bedside  HEENT: MM pink/moist, poor dentition Neuro: AAOx4, speech clear, MAE CV: s1s2 rrr, no m/r/g PULM:  even/non-labored, lungs bilaterally clear  OJ:JKKX, non-tender, bsx4 active  Extremities: warm/dry, trace pedal/LE edema, changes consistent with venous stasis  Skin: sacral stage II   Resolved Hospital Problem list     Assessment & Plan:   Septic Shock -adequately volume resuscitated, still requiring low dose vasopressors -component adrenal insufficiency  P: Wean neo off for MAP >65  LR at 141ml/hr  Continue stress steroids    Staph bacteremia -presumable source is the left hip but joint aspirate is negative to date, recent right knee replacement but exam is unremarkable, recent dental extraction, sacral moisture associated wound P: Follow cultures ID following, appreciate input  Await TTE  Case discussed with Dr. Synthia Innocent, he will come by and see her this evening.  Appreciate input.  Called lab to see if they could perform cell count/diff on fluids > waiting on response from lab   Atrial Fibrillation  -new onset 12/11 overnight.. Pt has been experiencing intermittent symptoms for several weeks P: Continue amiodarone gtt  Cardiology consulted, appreciate input Tele monitoring  ?cardioversion, anticoagulation > defer to Cards   Left hip pain P: PRN tylenol, vicodin   Hypertension -baseline P: Hold home benicar, HCTZ   Hypokalemia  P: Monitor, replace as indicated   Best practice:  Diet: regular diet DVT prophylaxis: enoxaparin GI prophylaxis: n/a Glucose control: n/a Mobility: Per Ortho Code Status: full Family Communication:  Patient and daughter updated at bedside Disposition: remain  in ICU  Labs   CBC: Recent Labs  Lab 04/09/18 1600 04/09/18 2052 04/10/18 0321 04/11/18 0313 04/12/18 0236  WBC 15.4* 16.3* 22.8* 17.1* 21.3*  NEUTROABS 13.5*  --   --  13.8*  --   HGB 9.7* 9.0* 10.2* 8.6* 8.9*  HCT 30.0* 28.0* 32.5* 28.1* 29.8*  MCV 92.0 92.1 92.9 95.3 100.0  PLT 83* 65* 84* 65* 68*    Basic Metabolic Panel: Recent Labs  Lab  04/09/18 1801 04/09/18 2052 04/10/18 0321 04/11/18 0313 04/11/18 2223 04/12/18 0236  NA 136  --  139 138 136 137  K 3.5  --  3.9 3.2* 4.2 4.0  CL 99  --  105 106 105 106  CO2 25  --  25 24 22 22   GLUCOSE 104*  --  110* 111* 149* 147*  BUN 49*  --  40* 19 15 17   CREATININE 1.40* 1.54* 1.27* 0.87 0.78 0.75  CALCIUM 7.9*  --  7.8* 7.6* 7.6* 7.4*   GFR: Estimated Creatinine Clearance: 82.2 mL/min (by C-G formula based on SCr of 0.75 mg/dL). Recent Labs  Lab 04/09/18 1618 04/09/18 2030 04/09/18 2052 04/10/18 0321 04/11/18 0313 04/12/18 0236  WBC  --   --  16.3* 22.8* 17.1* 21.3*  LATICACIDVEN 1.84 1.30  --   --   --   --     Liver Function Tests: Recent Labs  Lab 04/09/18 1801 04/11/18 2223  AST 33 25  ALT 25 13  ALKPHOS 72 53  BILITOT 1.3* 1.3*  PROT 6.1* 5.7*  ALBUMIN 2.7* 2.2*   No results for input(s): LIPASE, AMYLASE in the last 168 hours. No results for input(s): AMMONIA in the last 168 hours.  ABG    Component Value Date/Time   PHART 7.305 (L) 10/19/2008 0354   PCO2ART 57.0 (H) 10/19/2008 0354   PO2ART 85.0 10/19/2008 0354   HCO3 28.2 (H) 10/19/2008 0354   TCO2 30 10/19/2008 0354   O2SAT 95.0 10/19/2008 0354     Coagulation Profile: No results for input(s): INR, PROTIME in the last 168 hours.  Cardiac Enzymes: Recent Labs  Lab 04/12/18 0706  TROPONINI 0.09*    HbA1C: Hgb A1c MFr Bld  Date/Time Value Ref Range Status  11/23/2017 11:17 AM 5.5 4.6 - 6.5 % Final    Comment:    Glycemic Control Guidelines for People with Diabetes:Non Diabetic:  <6%Goal of Therapy: <7%Additional Action Suggested:  >8%   01/05/2017 11:31 AM 5.5 4.8 - 5.6 % Final    Comment:             Prediabetes: 5.7 - 6.4          Diabetes: >6.4          Glycemic control for adults with diabetes: <7.0     CBG: No results for input(s): GLUCAP in the last 168 hours.     Critical care time: 29 minutes     Noe Gens, NP-C [place] [facility] Pgr:  [phone] or if no answer [phone] 04/12/2018, 10:21 AM

## 2018-04-12 NOTE — Consult Note (Addendum)
Cardiology Consultation:   Patient ID: Elizabeth [name] MRN: [id number]; DOB: 02-03-1951  Admit date: 04/09/2018 Date of Consult: 04/12/2018  Primary Care Provider: Carollee [name], Elizabeth Apa, DO Primary Cardiologist: Elizabeth Latch, MD  Primary Electrophysiologist:  None    Patient Profile:   Elizabeth [name] is a 67 y.o. female with a hx of HTN, HLD, mild AS, lung cancer with lobectomy, morbid obesity, osteoarthritis with no known CAD who is being seen today for the evaluation of a fib with admit of sepsis at the request of Dr. Lake [name].  History of Present Illness:   Elizabeth [name] with prior hx of HTN, HLD, mild AS, lung cnacer with lobectomy, osteoarthritis, and morbid obesity, seen by cardiology originally for a murmur and found to have mild aortic stenosis.  She had complained of leg pain but ABIs were neg for occlusive disease.    Now admitted 04/09/18 with lt hip pain and high fever and hypotension.  On admit no chest pain or SOB.  Temp on admit was 092 and systolic BP was 330.  She has been treated with ABX for septic arthritis.  CCM has also been managing and IV neo started. ID has seen, + staph lugdunensis bacteremia most likely from Lt hip septic arthritis.      Yesterday increase of PVCs and K+ was low so repleted. Then in night HR to 150s IV fluid bolus given and pt complained of chest tightness.   From current strips is SVT with small interruptions with sinus beat.    Her troponin was 0.09   Today Hgb is 8.9, WBC of 21.3, plts 68  Down from 136 on admit Na 137, K+ 4.0, Cr 0.75  EKG:  The EKG was personally reviewed and demonstrates:  SVT at 169  Telemetry:  Telemetry was personally reviewed and demonstrates:  SVT with brief breaks in Sinus.   IV amiodarone was started. Now at 30 ml/hr and HR still elevated.    Currently no chest discomfort - she does note at home sometimes within her BP is lower she feels her heart racing.  But briefly.   Neo is off but BP is 90.  HR 130s to 150s.  SVT.  Do not believe a fib, could have a flutter component but most likely atrial tach. Her BP is soft.     Past Medical History:  Diagnosis Date  . Aortic stenosis, mild 01/08/2015  . Hx of cancer of lung 6/10   Removed June 2010  . Hyperlipidemia   . Hypertension   . Low back pain   . Morbid obesity ([place]) 01/08/2015  . Right foot injury    report she sustained a fracture to her right , present with ortho boot on affected extremity   . Vitamin D deficiency     Past Surgical History:  Procedure Laterality Date  . ANKLE FUSION Left   . CHOLECYSTECTOMY    . LUNG REMOVAL, PARTIAL    . TOTAL KNEE ARTHROPLASTY Right 12/12/2017   Procedure: RIGHT TOTAL KNEE ARTHROPLASTY;  Surgeon: Gaynelle Arabian, MD;  Location: [facility];  Service: Orthopedics;  Laterality: Right;  . TUBAL LIGATION       Home Medications:  Prior to Admission medications   Medication Sig Start Date End Date Taking? Authorizing Provider  Cholecalciferol (DIALYVITE VITAMIN D 5000) 125 MCG (5000 UT) capsule Take 5,000 Units by mouth daily.   Yes [provider]  Homeopathic Products (LEG CRAMPS) TABS Take 3 tablets by mouth daily as needed (cramping).  Yes [provider]  HYDROcodone-acetaminophen (NORCO) 5-325 MG tablet Take 1 tablet by mouth every 4 (four) hours as needed (for pain). 04/04/18  Yes Molpus, John, MD  Multiple Vitamins-Minerals (HAIR SKIN AND NAILS FORMULA) TABS Take 2 tablets by mouth daily.   Yes [provider]  olmesartan-hydrochlorothiazide (BENICAR HCT) 20-12.5 MG tablet Take 1 tablet by mouth daily. 01/26/18  Yes Roma Schanz R, DO  oxyCODONE (OXY IR/ROXICODONE) 5 MG immediate release tablet Take 7.5 mg by mouth every 6 (six) hours as needed for moderate pain or severe pain.    Yes [provider]  predniSONE (DELTASONE) 10 MG tablet Take 10 mg by mouth as directed. Take 6 tablet for 2 Days Take 5 tablet for 2 Days Take 4 tablets for 2 Days Take 3 tablets for 2  Days Take 2 tablets for 2 Days Take 1 tablet for 2 Days   Yes [provider]    Inpatient Medications: Scheduled Meds: . enoxaparin (LOVENOX) injection  40 mg Subcutaneous Q24H  . feeding supplement (PRO-STAT SUGAR FREE 64)  30 mL Oral BID  . hydrocortisone sodium succinate  50 mg Intravenous Q6H  . multivitamin with minerals  1 tablet Oral Daily   Continuous Infusions: . amiodarone 30 mg/hr (04/12/18 1000)  .  ceFAZolin (ANCEF) IV Stopped (04/12/18 0657)  . lactated ringers 125 mL/hr at 04/12/18 1000  . phenylephrine (NEO-SYNEPHRINE) Adult infusion Stopped (04/12/18 0958)   PRN Meds: acetaminophen, fentaNYL (SUBLIMAZE) injection, HYDROcodone-acetaminophen  Allergies:    Allergies  Allergen Reactions  . Penicillins Other (See Comments)    Fever -when she was 67 yrs old Has patient had a PCN reaction causing immediate rash, facial/tongue/throat swelling, SOB or lightheadedness with hypotension: NO Has patient had a PCN reaction causing severe rash involving mucus membranes or skin necrosis: Unknown Has patient had a PCN reaction that required hospitalization: Unknown Has patient had a PCN reaction occurring within the last 10 years: No Childhood allergy If all of the above answers are "NO", then may proceed with Cephalosporin use.     Social History:   Social History   Socioeconomic History  . Marital status: Divorced    Spouse name: Not on file  . Number of children: 1  . Years of education: Not on file  . Highest education level: Not on file  Occupational History  . Occupation: Software engineer: [employer]  Social Needs  . Financial resource strain: Not on file  . Food insecurity:    Worry: Not on file    Inability: Not on file  . Transportation needs:    Medical: Not on file    Non-medical: Not on file  Tobacco Use  . Smoking status: Former Smoker    Types: Cigarettes    Last attempt to quit: 02/01/2005    Years since  quitting: 13.2  . Smokeless tobacco: Never Used  Substance and Sexual Activity  . Alcohol use: No    Alcohol/week: 0.0 standard drinks  . Drug use: No  . Sexual activity: Yes  Lifestyle  . Physical activity:    Days per week: Not on file    Minutes per session: Not on file  . Stress: Not on file  Relationships  . Social connections:    Talks on phone: Not on file    Gets together: Not on file    Attends religious service: Not on file    Active member of club or organization: Not on file  Attends meetings of clubs or organizations: Not on file    Relationship status: Not on file  . Intimate partner violence:    Fear of current or ex partner: Not on file    Emotionally abused: Not on file    Physically abused: Not on file    Forced sexual activity: Not on file  Other Topics Concern  . Not on file  Social History Narrative   Exercise--  no    Family History:    Family History  Problem Relation Age of Onset  . Hypertension Mother   . Stroke Mother   . Cancer Mother        lung  . Heart disease Father        cabg  . Cancer Brother        skin     ROS:  Please see the history of present illness.  General:no colds + fevers, septic hip no weight changes Skin:no rashes or ulcers HEENT:no blurred vision, no congestion CV:see HPI PUL:see HPI GI:no diarrhea constipation or melena, no indigestion GU:no hematuria, no dysuria MS:+ joint pain, no claudication Neuro:no syncope, no lightheadedness Endo:no diabetes, no thyroid disease  All other ROS reviewed and negative.     Physical Exam/Data:   Vitals:   04/12/18 1015 04/12/18 1030 04/12/18 1045 04/12/18 1100  BP: 105/85 109/65 109/71 (!) 81/62  Pulse: 64 (!) 47 81 77  Resp: (!) 22 20 (!) 27 (!) 0  Temp:      TempSrc:      SpO2: 98% 98% 96% 97%  Weight:      Height:        Intake/Output Summary (Last 24 hours) at 04/12/2018 1135 Last data filed at 04/12/2018 1000 Gross per 24 hour  Intake 5781.46 ml    Output 900 ml  Net 4881.46 ml   Filed Weights   04/09/18 2230 04/10/18 0300  Weight: 111.5 kg 111.5 kg   Body mass index is 42.19 kg/m.  General:  Well nourished, well developed, some distress with SOB but not so much aware of rapid HR.  HEENT: normal Lymph: no adenopathy Neck: no JVD Endocrine:  No thryomegaly Vascular: No carotid bruits; pedal pulses 2+ bilaterally Cardiac:  normal S1, S2; RRR; 1-7/4 systolic murmur no gallup rub or click Lungs:  clear to diminished auscultation bilaterally, no wheezing, rhonchi or rales  Abd: soft, nontender, no hepatomegaly  Ext: 1+ lower ext edema of lower ext greatest in feet Musculoskeletal:  No deformities, BUE and BLE strength normal and equal Skin: warm and dry  Neuro:  Alert and oriented X 3 MAE follows commands, no focal abnormalities noted Psych:  Normal affect    Relevant CV Studies: Echo 1 year ago  Study Conclusions  - Left ventricle: The cavity size was normal. Systolic function was   normal. The estimated ejection fraction was in the range of 60%   to 65%. Wall motion was normal; there were no regional wall   motion abnormalities. Left ventricular diastolic function   parameters were normal. - Aortic valve: Moderately calcified annulus. Trileaflet; mildly   thickened, mildly calcified leaflets. There was mild stenosis.   Mean gradient (S): 14 mm Hg. Peak gradient (S): 27 mm Hg. - Mitral valve: There was trivial regurgitation.  Impressions:  - Normal LVF, normal diastolic function, mild AS with mean AV   gradient 7mmHg and AVA by VTA 1.54cm2.  Echo this admit pending    Laboratory Data:  Chemistry Recent Labs  Lab 04/11/18  8527 04/11/18 2223 04/12/18 0236  NA 138 136 137  K 3.2* 4.2 4.0  CL 106 105 106  CO2 24 22 22   GLUCOSE 111* 149* 147*  BUN 19 15 17   CREATININE 0.87 0.78 0.75  CALCIUM 7.6* 7.6* 7.4*  GFRNONAA >60 >60 >60  GFRAA >60 >60 >60  ANIONGAP 8 9 9     Recent Labs  Lab 04/09/18 1801  04/11/18 2223  PROT 6.1* 5.7*  ALBUMIN 2.7* 2.2*  AST 33 25  ALT 25 13  ALKPHOS 72 53  BILITOT 1.3* 1.3*   Hematology Recent Labs  Lab 04/10/18 0321 04/11/18 0313 04/12/18 0236  WBC 22.8* 17.1* 21.3*  RBC 3.50* 2.95* 2.98*  HGB 10.2* 8.6* 8.9*  HCT 32.5* 28.1* 29.8*  MCV 92.9 95.3 100.0  MCH 29.1 29.2 29.9  MCHC 31.4 30.6 29.9*  RDW 14.8 14.8 15.0  PLT 84* 65* 68*   Cardiac Enzymes Recent Labs  Lab 04/12/18 0706  TROPONINI 0.09*   No results for input(s): TROPIPOC in the last 168 hours.  BNPNo results for input(s): BNP, PROBNP in the last 168 hours.  DDimer No results for input(s): DDIMER in the last 168 hours.  Radiology/Studies:  Dg Chest 2 View  Result Date: 04/09/2018 CLINICAL DATA:  Fever. Status post right knee replacement on November 11th. EXAM: CHEST - 2 VIEW COMPARISON:  CT chest dated 01/20/2018 FINDINGS: Trace right pleural effusion, chronic. Left lung is clear. No pneumothorax. Surgical clips in the right perihilar region. The heart is normal in size. Visualized osseous structures are within normal limits. IMPRESSION: Postsurgical changes right hemithorax with chronic trace right pleural effusion. No evidence of acute cardiopulmonary disease. Electronically Signed   By: Julian Hy M.D.   On: 04/09/2018 17:02   Dg Arthro Hip Left  Result Date: 04/11/2018 INDICATION: Severe left hip pain.  Clinical concern for septic arthritis. EXAM: ARTHROCENTESIS OF LEFT HIP JOINT UNDER FLUOROSCOPIC GUIDANCE COMPARISON:  Radiographs 04/09/2018 and MRI 04/04/2018 FLUOROSCOPY TIME:  Fluoroscopy Time:  54 seconds Radiation Exposure Index (if provided by the fluoroscopic device): 17.5 mGy Number of Acquired Spot Images: 0 COMPLICATIONS: None immediate. PROCEDURE: Informed written consent was obtained from the patient after discussion of the risks, benefits and alternatives to treatment. The patient was placed prone on the fluoroscopy table and the left extremity was placed in  a slight degree of flexion and internal rotation. The left hip was localized with fluoroscopy. The skin overlying the anterior aspect of the hip was prepped and draped in usual sterile fashion. A 22 gauge spinal needle was advanced into the hip joint at the lateral aspect of the femoral head-neck junction, after the overlying soft tissues were anesthetized with 1% lidocaine. No fluid could be aspirated. Subsequently, 2 cc Isovue-300 was injected into the joint to document intra-articular location. A fluoroscopic image was saved and sent to PACs. 2 cc of fluid was then aspirated from the left hip joint. The aspirated fluid was sent for laboratory evaluation ordered by referring physician. The needle was removed and a dressing was placed. The patient tolerated procedure well without immediate postprocedural complication. IMPRESSION: Successful fluoroscopic guided aspiration of the left hip. Electronically Signed   By: Marijo Sanes M.D.   On: 04/11/2018 07:55   Dg Chest Port 1 View  Result Date: 04/10/2018 CLINICAL DATA:  Chest tightness EXAM: PORTABLE CHEST 1 VIEW COMPARISON:  04/09/2018 FINDINGS: Cardiac shadow is stable. Postsurgical changes are noted on the right. The lungs are clear. Persistent blunting of the right  costophrenic angle is noted. No pneumothorax is seen. IMPRESSION: Stable blunting of the right costophrenic angle. Postoperative changes without pneumothorax. Electronically Signed   By: Inez Catalina M.D.   On: 04/10/2018 16:32   Dg Hip Unilat W Or Wo Pelvis 2-3 Views Left  Result Date: 04/09/2018 CLINICAL DATA:  Left hip pain. EXAM: DG HIP (WITH OR WITHOUT PELVIS) 2-3V LEFT COMPARISON:  Radiographs 107/01/202019 FINDINGS: Bilateral hip joint degenerative changes severe on the left and moderately severe on the right. There is joint space narrowing and osteophytic spurring. No definite fracture or AVN. The pubic symphysis and SI joints are intact. No pelvic fractures or bone lesions. IMPRESSION:  Severe left and moderately severe right hip joint degenerative changes but no definite fracture or AVN. Electronically Signed   By: Marijo Sanes M.D.   On: 04/09/2018 17:00   [place] Ekg Site Rite  Result Date: 04/12/2018 If Site Rite image not attached, placement could not be confirmed due to current cardiac rhythm.   Assessment and Plan:   1. SVT - on amiodarone at 30 ml/min.  With lower BP difficult to manage. Could give a dose of dig.  Echo is pending not a fib.  Does not seem to be a flutter.  CHA2DS2VASc of 3, HTN, age and female but with plts in the 52s and her anemia would not anticoagulate if were to be a flutter.  Will check mg+ and TSH  2. Elevated troponin 0.09 most likely demand ischemia with rapid HR and her sepsis.  Echo is pending to eval wall motion.  No Known CAD.  3. AS, mild on previous echo with bacteremia may need TEE. TTE is pending.   4. Septic hip and bacteremia and shock, followed by CCM and ID. Marland Kitchen    5. Thrombocytopenia with illness,  6. Leukocytosis on steroids as well. 7. Hx of Lung cancer with lobectomy      For questions or updates, please contact [place] Please consult [email or web address] for contact info under     Signed, Cecilie Kicks, NP  04/12/2018 11:35 AM As above, patient seen and examined.  Briefly she is a 67 year old female with past medical history of hypertension, hyperlipidemia, previous lung cancer status post lobectomy, mild aortic stenosis admitted with hip infection for evaluation of supraventricular tachycardia.  Patient admitted December 8 with left hip pain, fever and hypotension.  She is being treated for septic arthritis.  On telemetry she has been noted to have supraventricular tachycardia intermittently.  Cardiology asked to evaluate.  She denies increased dyspnea, chest discomfort but does feel occasional palpitations.  Electrocardiogram shows supraventricular tachycardia with nonspecific ST changes. Blood cultures grew staph lugdunesis  in 4/4 bottles  1 supraventricular tachycardia-I have reviewed the patient's electrocardiogram and telemetry.  He appears to be having supraventricular tachycardia (question AVNRT versus PAT).  She is hypotensive and therefore we cannot treat with beta-blockade or calcium blocker.  I agree with continuing amiodarone.  We will arrange echocardiogram to assess LV function.  There is no indication for cardioversion as she is having intermittent sinus.  2 septic arthritis-continue antibiotics.  Management per orthopedics.  3 sepsis/bacteremia-likely related to septic arthritis.  Echocardiogram will be performed to assess LV function for SVT but also to screen for vegetations.  4 history of mild aortic stenosis  Kirk Ruths, MD

## 2018-04-12 NOTE — Progress Notes (Addendum)
eLink Physician-Brief Progress Note Patient Name: Elizabeth [name] DOB: 04-Sep-1950 MRN: [id number]   Date of Service  04/12/2018  HPI/Events of Note  Pt with bacteremia, on neosynephrine.  Pt went into SVT, with rates in the 150s. Pt also complaining of chest tightness.  eICU Interventions  Give 1L NS bolus.  Titrate neosynephrine down to off.  If pt needs pressors, will try norepinephrine.      Intervention Category Major Interventions: Arrhythmia - evaluation and management  Elsie Lincoln 04/12/2018, 1:57 AM   3:03 AM Pt remains in SVT despite being off neosynephrine. MAP >65.   Give Amiodarone 150mg  bolus x 1.

## 2018-04-13 DIAGNOSIS — R509 Fever, unspecified: Secondary | ICD-10-CM

## 2018-04-13 DIAGNOSIS — R0789 Other chest pain: Secondary | ICD-10-CM

## 2018-04-13 DIAGNOSIS — M25552 Pain in left hip: Secondary | ICD-10-CM

## 2018-04-13 LAB — CULTURE, BLOOD (ROUTINE X 2)
SPECIAL REQUESTS: ADEQUATE
Special Requests: ADEQUATE

## 2018-04-13 LAB — BASIC METABOLIC PANEL
Anion gap: 9 (ref 5–15)
BUN: 22 mg/dL (ref 8–23)
CO2: 22 mmol/L (ref 22–32)
Calcium: 8 mg/dL — ABNORMAL LOW (ref 8.9–10.3)
Chloride: 106 mmol/L (ref 98–111)
Creatinine, Ser: 0.81 mg/dL (ref 0.44–1.00)
GFR calc Af Amer: >60 mL/min (ref >60)
GFR calc non Af Amer: >60 mL/min (ref >60)
GLUCOSE: 178 mg/dL — AB (ref 70–99)
Potassium: 4.2 mmol/L (ref 3.5–5.1)
SODIUM: 137 mmol/L (ref 135–145)

## 2018-04-13 LAB — CBC
HCT: 32.1 % — ABNORMAL LOW (ref 36.0–46.0)
Hemoglobin: 10.1 g/dL — ABNORMAL LOW (ref 12.0–15.0)
MCH: 29.9 pg (ref 26.0–34.0)
MCHC: 31.5 g/dL (ref 30.0–36.0)
MCV: 95 fL (ref 80.0–100.0)
NRBC: 0 % (ref 0.0–0.2)
Platelets: 87 10*3/uL — ABNORMAL LOW (ref 150–400)
RBC: 3.38 MIL/uL — ABNORMAL LOW (ref 3.87–5.11)
RDW: 14.9 % (ref 11.5–15.5)
WBC: 27.7 10*3/uL — ABNORMAL HIGH (ref 4.0–10.5)

## 2018-04-13 LAB — TROPONIN I: Troponin I: 0.07 ng/mL (ref <0.03)

## 2018-04-13 LAB — MAGNESIUM: MAGNESIUM: 1.9 mg/dL (ref 1.7–2.4)

## 2018-04-13 MED ORDER — MIDODRINE HCL 5 MG PO TABS
5.0000 mg | ORAL_TABLET | Freq: Three times a day (TID) | ORAL | Status: DC
  Administered 2018-04-13 – 2018-04-15 (×4): 5 mg via ORAL
  Filled 2018-04-13 (×5): qty 1

## 2018-04-13 MED ORDER — ONDANSETRON HCL 4 MG/2ML IJ SOLN
4.0000 mg | Freq: Four times a day (QID) | INTRAMUSCULAR | Status: DC | PRN
  Administered 2018-04-14 – 2018-04-17 (×5): 4 mg via INTRAVENOUS
  Filled 2018-04-13 (×5): qty 2

## 2018-04-13 MED ORDER — MAGNESIUM SULFATE 2 GM/50ML IV SOLN
2.0000 g | Freq: Once | INTRAVENOUS | Status: AC
  Administered 2018-04-13: 2 g via INTRAVENOUS
  Filled 2018-04-13: qty 50

## 2018-04-13 MED ORDER — AMIODARONE LOAD VIA INFUSION
150.0000 mg | Freq: Once | INTRAVENOUS | Status: AC
  Administered 2018-04-13: 150 mg via INTRAVENOUS
  Filled 2018-04-13: qty 83.34

## 2018-04-13 MED ORDER — CHLORHEXIDINE GLUCONATE CLOTH 2 % EX PADS
6.0000 | MEDICATED_PAD | Freq: Every day | CUTANEOUS | Status: DC
  Administered 2018-04-13 – 2018-04-28 (×14): 6 via TOPICAL

## 2018-04-13 MED ORDER — SODIUM CHLORIDE 0.9% FLUSH: 10.0000 mL | INTRAVENOUS | Status: DC | PRN

## 2018-04-13 MED ORDER — ONDANSETRON HCL 4 MG/2ML IJ SOLN
INTRAMUSCULAR | Status: AC
  Administered 2018-04-13: 4 mg
  Filled 2018-04-13: qty 2

## 2018-04-13 MED ORDER — SODIUM CHLORIDE 0.9% FLUSH
10.0000 mL | Freq: Two times a day (BID) | INTRAVENOUS | Status: DC
  Administered 2018-04-13 – 2018-04-18 (×10): 10 mL
  Administered 2018-04-18: 40 mL
  Administered 2018-04-19 – 2018-04-22 (×7): 10 mL
  Administered 2018-04-22: 20 mL
  Administered 2018-04-23 – 2018-04-27 (×10): 10 mL

## 2018-04-13 NOTE — Progress Notes (Addendum)
Progress Note  Patient Name: Elizabeth [name] Date of Encounter: 04/13/2018  Primary Cardiologist: Skeet Latch, MD   Subjective   Resting in bed. Denies palpitations despite HR. No CP or dyspnea. Hip hurts if she moves.   Inpatient Medications    Scheduled Meds: . enoxaparin (LOVENOX) injection  40 mg Subcutaneous Q24H  . feeding supplement (PRO-STAT SUGAR FREE 64)  30 mL Oral BID  . hydrocortisone sodium succinate  50 mg Intravenous Q6H  . mouth rinse  15 mL Mouth Rinse BID  . multivitamin with minerals  1 tablet Oral Daily   Continuous Infusions: . amiodarone 30 mg/hr (04/12/18 2300)  .  ceFAZolin (ANCEF) IV 2 g (04/13/18 0526)  . lactated ringers 125 mL/hr at 04/13/18 0230  . phenylephrine (NEO-SYNEPHRINE) Adult infusion 8 mcg/min (04/12/18 2300)   PRN Meds: acetaminophen, fentaNYL (SUBLIMAZE) injection, HYDROcodone-acetaminophen   Vital Signs    Vitals:   04/13/18 0515 04/13/18 0530 04/13/18 0545 04/13/18 0600  BP: 107/65 119/70 113/65 100/84  Pulse: 62 (!) 158 (!) 141 69  Resp: 11 11 12  (!) 24  Temp:      TempSrc:      SpO2: 99% 99% 99% 98%  Weight:      Height:        Intake/Output Summary (Last 24 hours) at 04/13/2018 0743 Last data filed at 04/13/2018 0612 Gross per 24 hour  Intake 2594.59 ml  Output 850 ml  Net 1744.59 ml   Filed Weights   04/09/18 2230 04/10/18 0300 04/13/18 0500  Weight: 111.5 kg 111.5 kg 121.1 kg    Telemetry    Intermittent SVT, mostly tachy with HRs as high as 160s, intermittent sinus rthythm - Personally Reviewed  ECG    SR / PACs - Personally Reviewed  Physical Exam   GEN: obese WF, in No acute distress.   Neck: No JVD Cardiac: tachy rate.  Respiratory: Clear to auscultation bilaterally. GI: Soft, nontender, non-distended  MS: No edema; No deformity. Neuro:  Nonfocal  Psych: Normal affect   Labs    Chemistry Recent Labs  Lab 04/09/18 1801  04/11/18 2223 04/12/18 0236 04/13/18 0107  NA 136   < >  136 137 137  K 3.5   < > 4.2 4.0 4.2  CL 99   < > 105 106 106  CO2 25   < > 22 22 22   GLUCOSE 104*   < > 149* 147* 178*  BUN 49*   < > 15 17 22   CREATININE 1.40*   < > 0.78 0.75 0.81  CALCIUM 7.9*   < > 7.6* 7.4* 8.0*  PROT 6.1*  --  5.7*  --   --   ALBUMIN 2.7*  --  2.2*  --   --   AST 33  --  25  --   --   ALT 25  --  13  --   --   ALKPHOS 72  --  53  --   --   BILITOT 1.3*  --  1.3*  --   --   GFRNONAA 39*   < > >60 >60 >60  GFRAA 45*   < > >60 >60 >60  ANIONGAP 12   < > 9 9 9    < > = values in this interval not displayed.     Hematology Recent Labs  Lab 04/11/18 0313 04/12/18 0236 04/13/18 0107  WBC 17.1* 21.3* 27.7*  RBC 2.95* 2.98* 3.38*  HGB 8.6* 8.9* 10.1*  HCT  28.1* 29.8* 32.1*  MCV 95.3 100.0 95.0  MCH 29.2 29.9 29.9  MCHC 30.6 29.9* 31.5  RDW 14.8 15.0 14.9  PLT 65* 68* 87*    Cardiac Enzymes Recent Labs  Lab 04/12/18 0706 04/12/18 1300 04/13/18 0107  TROPONINI 0.09* 0.08* 0.07*   No results for input(s): TROPIPOC in the last 168 hours.   BNPNo results for input(s): BNP, PROBNP in the last 168 hours.   DDimer No results for input(s): DDIMER in the last 168 hours.   Radiology    [place] Ekg Site Rite  Result Date: 04/12/2018 If [facility] image not attached, placement could not be confirmed due to current cardiac rhythm.   Cardiac Studies   2D Echo 04/12/18 Study Conclusions  - Left ventricle: The cavity size was normal. Systolic function was   normal. The estimated ejection fraction was in the range of 60%   to 65%. Wall motion was normal; there were no regional wall   motion abnormalities. - Aortic valve: Trileaflet; severely thickened, severely calcified   leaflets. 1.5 x 2 cm calcified aortic mass noted (possible old   calcified vegetation). This is new when compared to prior   echocardiogram. Valve mobility was restricted. There was mild   regurgitation. - Mitral valve: There was moderate regurgitation. - Tricuspid valve: There was  moderate regurgitation.   Patient Profile     67 year old female with past medical history of hypertension, hyperlipidemia, previous lung cancer status post lobectomy and mild aortic stenosis. Patient admitted December 8 with left hip pain, fever and hypotension.  She is being treated for septic arthritis. Cardiology consulted for SVT.   Assessment & Plan    1. Septic Arthritis Involving the Hip/Bacteremia: Blood cultures grew staph lugdunesis in 4/4 bottles. On antibiotics. Management per ortho. TTE 04/12/18 showed 1.5 x 2 cm calcified aortic mass noted (possible old calcified vegetation). This is new when compared to prior echocardiogram. Mild AI, moderate MR, moderate TR. Given bacteremia, she will need a TEE to further evaluate. She is not NPO. Can try to arrange for tomorrow.   2. SVT: ? AVNRT vs PAT. This is in the setting of sepsis and bacteremia. Tele reviewed today. Intermittent SVT but mostly tachy. Max rate in the 160s, but there were periods during my exam where she was observed to slow down into clear NSR in the 80s-90s but immediately return to SVT. Several pauses, < 2 sec. Despite HR, she denies palpitations. No CP or dyspnea.   TTE yesterday showed normal LVEF. K is WNL. She has had some hypotension this admit, which has limited use of  blockers and CCBs. Continue amiodarone. Continue to treat sepsis and bacteremia.    For questions or updates, please contact [place] Please consult [email or web address] for contact info under        Signed, Lyda Jester, PA-C  04/13/2018, 7:44 AM   As above, patient seen and examined.  She is mildly dyspneic but denies chest pain.  She continues to have intermittent SVT.  Her blood pressure is low requiring Neo-Synephrine and we therefore cannot add a beta-blocker or calcium blocker.  Continue amiodarone at present dose.  Hopefully has her sepsis improves her rhythm will stabilize.  I have personally reviewed a transthoracic  echocardiogram.  She has normal LV function.  There is calcification of the aortic valve which is difficult to fully assess.  When compared to December 09, 2016 calcification was present previously but this appears to be more prominent.  Given bacteremia I  think she will require transesophageal echocardiogram to more fully assess.  I would like for her blood pressure to be more stable prior to this.  Note her aortic insufficiency is only mild.  We will plan to proceed hopefully tomorrow or early next week.  Continue antibiotics.  Kirk Ruths, MD

## 2018-04-13 NOTE — Progress Notes (Signed)
Peripherally Inserted Central Catheter/Midline Placement  The IV Nurse has discussed with the patient and/or persons authorized to consent for the patient, the purpose of this procedure and the potential benefits and risks involved with this procedure.  The benefits include less needle sticks, lab draws from the catheter, and the patient may be discharged home with the catheter. Risks include, but not limited to, infection, bleeding, blood clot (thrombus formation), and puncture of an artery; nerve damage and irregular heartbeat and possibility to perform a PICC exchange if needed/ordered by physician.  Alternatives to this procedure were also discussed.  Bard Power PICC patient education guide, fact sheet on infection prevention and patient information card has been provided to patient /or left at bedside.    PICC/Midline Placement Documentation  PICC Double Lumen 04/13/18 PICC Right Cephalic 44 cm 0 cm (Active)  Indication for Insertion or Continuance of Line Prolonged intravenous therapies 04/13/2018  2:29 PM  Exposed Catheter (cm) 0 cm 04/13/2018  2:29 PM  Site Assessment Clean;Dry;Intact 04/13/2018  2:29 PM  Lumen #1 Status Flushed;Blood return noted 04/13/2018  2:29 PM  Lumen #2 Status Flushed;Blood return noted 04/13/2018  2:29 PM  Dressing Type Transparent 04/13/2018  2:29 PM  Dressing Status Clean;Dry;Intact;Antimicrobial disc in place 04/13/2018  2:29 PM  Dressing Intervention New dressing 04/13/2018  2:29 PM  Dressing Change Due 04/20/18 04/13/2018  2:29 PM       Christella Noa Albarece 04/13/2018, 2:30 PM

## 2018-04-13 NOTE — Progress Notes (Signed)
TEE has been arranged tomorrow, 04/14/18, with Dr. Acie Fredrickson at 1:30 pm at [facility]. Will make NPO at midnight.

## 2018-04-13 NOTE — Progress Notes (Signed)
Patient is scheduled for TEE @ [facility] @ 1330. Patient needs to arrive via [facility] to [facility] @ 1200. Spoke with Alroy Dust ,RN to arrange [facility] transport.

## 2018-04-13 NOTE — Care Management Note (Signed)
Case Management Note  Patient Details  Name: Elizabeth [name] MRN: [id number] Date of Birth: [birth year]  Subjective/Objective:                  67 y.o. 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 Will follow for case management needs none present at this time.  Expected Discharge Date:                  Expected Discharge Plan:  Home/Self Care  In-House Referral:     Discharge planning Services  CM Consult  Post Acute Care Choice:    Choice offered to:     DME Arranged:    DME Agency:     HH Arranged:    HH Agency:     Status of Service:  In process, will continue to follow  If discussed at Long Length of Stay Meetings, dates discussed:    Additional Comments:  Leeroy Cha, RN 04/13/2018, 8:29 AM

## 2018-04-13 NOTE — Progress Notes (Signed)
[facility] Physician-Brief Progress Note Patient Name: Elizabeth [name] DOB: 1950/09/29 MRN: [id number]   Date of Service  04/13/2018  HPI/Events of Note  PT complaining of chest pain, sudden onset.  Heart rate in the 160s at the time.   eICU Interventions  Check EKG - sinus rhythm, rate in the 80s, with non-specific S-T wave changes.  Check troponin.  Give morphine pRN. Continue amiodarone.      Intervention Category Intermediate Interventions: Other:  Elsie Lincoln 04/13/2018, 12:22 AM

## 2018-04-13 NOTE — Progress Notes (Signed)
NAME:  Elizabeth [name], MRN:  [id number], DOB:  04-28-1951, LOS: 4 ADMISSION DATE:  04/09/2018, CONSULTATION DATE:  04/10/2018 REFERRING MD:  EDP, CHIEF COMPLAINT:  Left hip pain    Brief History   67 y/o female with osteoarthritis of the left hip who underwent an injection of the same on 11/11 (per IR) developed left hip pain on 11/14.  She presented to [facility] in septic shock with worsening left hip pain on 12/9 and was admitted to [facility].  Blood cultures grew staph lugdunesis in 4/4 bottles.  The left hip was aspirated and cultures to date are negative.  Repeat blood cultures are pending.   Past Medical History  Morbid obesity, hypertension, lung cancer resected 2010, aortic stenosis.  Right knee replacement.  Recent (~August 2019?) steroids for   Significant Hospital Events   12/08 Admission 12/10 Increased vasopressor requirements, lost IV access.  AI > stress dose steroids started  Consults:  12/8 Ortho  Procedures:  12/9 Left hip arthrocentesis  Significant Diagnostic Tests:  TTE 12/10 >> calcified aortic valve vegetation , LVEF 60-65%, mild aortic regurge, modereate MR,  Micro Data:  BCx4 12/8 >> staph legdunesis 4/4 Body fluid culture 12/9 >> staph lugdunensis Blood culture 12/10 > negative  Antimicrobials:  Cefepime 12/8 >> 12/9 Flagyl 12/8 >> 12/9  Vanco 12/8 >> 12/10 Cefazolin 12/9 >>  Interim history/subjective:  Chest pain overnight Remains profoundly tachycardic TTE yesterday showed a calcified aortic vegetation  Objective   Blood pressure 100/84, pulse 69, temperature 97.6 F (36.4 C), temperature source Oral, resp. rate (!) 24, height 5\' 4"  (1.626 m), weight 121.1 kg, SpO2 98 %.        Intake/Output Summary (Last 24 hours) at 04/13/2018 1021 Last data filed at 04/13/2018 0737 Gross per 24 hour  Intake 1928.74 ml  Output 550 ml  Net 1378.74 ml   Filed Weights   04/09/18 2230 04/10/18 0300 04/13/18 0500  Weight: 111.5 kg 111.5 kg 121.1 kg     Examination:  General:  Morbidly obese, resting comfortably in bed HENT: NCAT OP clear PULM: CTA B, normal effort CV: Tachy, no mgr GI: BS+, soft, nontender MSK: normal bulk and tone Neuro: awake, alert, no distress, MAEW   Resolved Hospital Problem list     Assessment & Plan:   Septic Shock: fever curve improving WBC up (steroids)  P: Wean neo off for MAP >55 , SBP > 80 Start Midodinre KVO fludis Continue stress dose steroids  Adrenal insufficiency P: Continue hydrocortisone 50mg  IV q6h  Septic arthritis P: Ortho planning washout when hemodynamics and HR improved  Staph lugdunensis: repeat cultures negative P: Continue cefazolin IV  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 P: Will need TEE  AVNRT P: Continue amiodarone for now OK with b-blockade or CCB if cardiology feels helpful OK to restart neo synephrine if necessary   Hypertension -baseline P: Continue to hold home benicar, HCTZ  Hypokalemia  P: replace  Best practice:  Diet: regular diet DVT prophylaxis: enoxaparin GI prophylaxis: n/a Glucose control: n/a Mobility: per ortho Code Status: full Family Communication:  Patient updated at bedside Disposition: remain in ICU  Labs   CBC: Recent Labs  Lab 04/09/18 1600 04/09/18 2052 04/10/18 0321 04/11/18 0313 04/12/18 0236 04/13/18 0107  WBC 15.4* 16.3* 22.8* 17.1* 21.3* 27.7*  NEUTROABS 13.5*  --   --  13.8*  --   --   HGB 9.7* 9.0* 10.2* 8.6* 8.9* 10.1*  HCT 30.0* 28.0* 32.5* 28.1* 29.8* 32.1*  MCV 92.0  92.1 92.9 95.3 100.0 95.0  PLT 83* 65* 84* 65* 68* 87*    Basic Metabolic Panel: Recent Labs  Lab 04/10/18 0321 04/11/18 0313 04/11/18 2223 04/12/18 0236 04/12/18 1228 04/13/18 0107  NA 139 138 136 137  --  137  K 3.9 3.2* 4.2 4.0  --  4.2  CL 105 106 105 106  --  106  CO2 25 24 22 22   --  22  GLUCOSE 110* 111* 149* 147*  --  178*  BUN 40* 19 15 17   --  22  CREATININE 1.27* 0.87 0.78 0.75  --  0.81  CALCIUM 7.8*  7.6* 7.6* 7.4*  --  8.0*  MG  --   --   --   --  1.8 1.9   GFR: Estimated Creatinine Clearance: 85.3 mL/min (by C-G formula based on SCr of 0.81 mg/dL). Recent Labs  Lab 04/09/18 1618 04/09/18 2030  04/10/18 0321 04/11/18 0313 04/12/18 0236 04/13/18 0107  WBC  --   --    < > 22.8* 17.1* 21.3* 27.7*  LATICACIDVEN 1.84 1.30  --   --   --   --   --    < > = values in this interval not displayed.    Liver Function Tests: Recent Labs  Lab 04/09/18 1801 04/11/18 2223  AST 33 25  ALT 25 13  ALKPHOS 72 53  BILITOT 1.3* 1.3*  PROT 6.1* 5.7*  ALBUMIN 2.7* 2.2*   No results for input(s): LIPASE, AMYLASE in the last 168 hours. No results for input(s): AMMONIA in the last 168 hours.  ABG    Component Value Date/Time   PHART 7.305 (L) 10/19/2008 0354   PCO2ART 57.0 (H) 10/19/2008 0354   PO2ART 85.0 10/19/2008 0354   HCO3 28.2 (H) 10/19/2008 0354   TCO2 30 10/19/2008 0354   O2SAT 95.0 10/19/2008 0354     Coagulation Profile: No results for input(s): INR, PROTIME in the last 168 hours.  Cardiac Enzymes: Recent Labs  Lab 04/12/18 0706 04/12/18 1300 04/13/18 0107  TROPONINI 0.09* 0.08* 0.07*    HbA1C: Hgb A1c MFr Bld  Date/Time Value Ref Range Status  11/23/2017 11:17 AM 5.5 4.6 - 6.5 % Final    Comment:    Glycemic Control Guidelines for People with Diabetes:Non Diabetic:  <6%Goal of Therapy: <7%Additional Action Suggested:  >8%   01/05/2017 11:31 AM 5.5 4.8 - 5.6 % Final    Comment:             Prediabetes: 5.7 - 6.4          Diabetes: >6.4          Glycemic control for adults with diabetes: <7.0     CBG: Recent Labs  Lab 04/12/18 1201  [place] care time: 35 minutes     Roselie Awkward, MD [place] Pager: [phone] Cell: [phone] If no response, call [phone]  04/13/2018, 10:21 AM

## 2018-04-14 ENCOUNTER — Encounter (HOSPITAL_COMMUNITY): Payer: Self-pay | Admitting: Anesthesiology

## 2018-04-14 ENCOUNTER — Inpatient Hospital Stay (HOSPITAL_COMMUNITY): Payer: Medicare Other

## 2018-04-14 ENCOUNTER — Encounter (HOSPITAL_COMMUNITY)
Admission: EM | Disposition: A | Discharge status: Skilled Nursing Facility | Payer: Self-pay | Source: Home / Self Care | Attending: Surgery

## 2018-04-14 LAB — CBC WITH DIFFERENTIAL/PLATELET
Abs Immature Granulocytes: 0.65 10*3/uL — ABNORMAL HIGH (ref 0.00–0.07)
Basophils Absolute: 0 10*3/uL (ref 0.0–0.1)
Basophils Relative: 0 %
Eosinophils Absolute: 0 10*3/uL (ref 0.0–0.5)
Eosinophils Relative: 0 %
HCT: 28.2 % — ABNORMAL LOW (ref 36.0–46.0)
HEMOGLOBIN: 8.6 g/dL — AB (ref 12.0–15.0)
Immature Granulocytes: 3 %
Lymphocytes Relative: 7 %
Lymphs Abs: 1.5 10*3/uL (ref 0.7–4.0)
MCH: 29.5 pg (ref 26.0–34.0)
MCHC: 30.5 g/dL (ref 30.0–36.0)
MCV: 96.6 fL (ref 80.0–100.0)
Monocytes Absolute: 0.9 10*3/uL (ref 0.1–1.0)
Monocytes Relative: 4 %
NEUTROS ABS: 19.2 10*3/uL — AB (ref 1.7–7.7)
Neutrophils Relative %: 86 %
Platelets: 84 10*3/uL — ABNORMAL LOW (ref 150–400)
RBC: 2.92 MIL/uL — ABNORMAL LOW (ref 3.87–5.11)
RDW: 15.1 % (ref 11.5–15.5)
WBC: 22.3 10*3/uL — ABNORMAL HIGH (ref 4.0–10.5)
nRBC: 0.1 % (ref 0.0–0.2)

## 2018-04-14 LAB — PROCALCITONIN: Procalcitonin: 0.24 ng/mL

## 2018-04-14 LAB — BASIC METABOLIC PANEL
Anion gap: 6 (ref 5–15)
BUN: 25 mg/dL — ABNORMAL HIGH (ref 8–23)
CHLORIDE: 108 mmol/L (ref 98–111)
CO2: 25 mmol/L (ref 22–32)
Calcium: 7.2 mg/dL — ABNORMAL LOW (ref 8.9–10.3)
Creatinine, Ser: 0.77 mg/dL (ref 0.44–1.00)
GFR calc Af Amer: >60 mL/min (ref >60)
GFR calc non Af Amer: >60 mL/min (ref >60)
Glucose, Bld: 149 mg/dL — ABNORMAL HIGH (ref 70–99)
Potassium: 4 mmol/L (ref 3.5–5.1)
Sodium: 139 mmol/L (ref 135–145)

## 2018-04-14 LAB — BODY FLUID CULTURE

## 2018-04-14 SURGERY — ECHOCARDIOGRAM, TRANSESOPHAGEAL
Anesthesia: Monitor Anesthesia Care

## 2018-04-14 MED ORDER — ADENOSINE 6 MG/2ML IV SOLN
INTRAVENOUS | Status: AC
  Filled 2018-04-14: qty 4

## 2018-04-14 MED ORDER — PROMETHAZINE HCL 25 MG PO TABS
25.0000 mg | ORAL_TABLET | Freq: Four times a day (QID) | ORAL | Status: DC | PRN
  Administered 2018-04-14: 25 mg via ORAL
  Filled 2018-04-14: qty 1

## 2018-04-14 MED ORDER — AMIODARONE IV BOLUS ONLY 150 MG/100ML
150.0000 mg | Freq: Once | INTRAVENOUS | Status: AC
  Administered 2018-04-14: 150 mg via INTRAVENOUS
  Filled 2018-04-14: qty 100

## 2018-04-14 NOTE — Consult Note (Addendum)
Cardiology Consultation:   Patient ID: Elizabeth [name] MRN: [id number]; DOB: 06/08/1950  Admit date: 04/09/2018 Date of Consult: 04/14/2018  Primary Care Provider: Carollee Herter, Alferd Apa, DO Primary Cardiologist: Skeet Latch, MD  Primary Electrophysiologist:  None    Patient Profile:   Elizabeth [name] is a 67 y.o. female with a hx of arthritis who is being seen today for the evaluation of incessant SVT at the request of Dr. Stanford Breed.  History of Present Illness:   Elizabeth [name] is morbidly obese 67yo woman who has a h/o lung CA, HTN, arthritis who underwent hip injection complicated by a septic hip. She has developed incessant SVT. She was placed on IV amiodarone. She has had persistent SVT, incessant and we are asked for additional rec's. The patient has a marked leukocytosis but is on steroids in additional to anti-biotics. She has never had syncope. Her cardiac enzymes are minimally elevated. She feels dyspneic. No anginal symptoms.    Past Medical History:  Diagnosis Date  . Aortic stenosis, mild 01/08/2015  . Hx of cancer of lung 6/10   Removed June 2010  . Hyperlipidemia   . Hypertension   . Low back pain   . Morbid obesity ([place]) 01/08/2015  . Right foot injury    report she sustained a fracture to her right , present with ortho boot on affected extremity   . Vitamin D deficiency     Past Surgical History:  Procedure Laterality Date  . ANKLE FUSION Left   . CHOLECYSTECTOMY    . LUNG REMOVAL, PARTIAL    . TOTAL KNEE ARTHROPLASTY Right 12/12/2017   Procedure: RIGHT TOTAL KNEE ARTHROPLASTY;  Surgeon: Gaynelle Arabian, MD;  Location: [facility];  Service: Orthopedics;  Laterality: Right;  . TUBAL LIGATION       Home Medications:  Prior to Admission medications   Medication Sig Start Date End Date Taking? Authorizing Provider  Cholecalciferol (DIALYVITE VITAMIN D 5000) 125 MCG (5000 UT) capsule Take 5,000 Units by mouth daily.   Yes [provider]  Homeopathic Products (LEG  CRAMPS) TABS Take 3 tablets by mouth daily as needed (cramping).   Yes [provider]  HYDROcodone-acetaminophen (NORCO) 5-325 MG tablet Take 1 tablet by mouth every 4 (four) hours as needed (for pain). 04/04/18  Yes Molpus, John, MD  Multiple Vitamins-Minerals (HAIR SKIN AND NAILS FORMULA) TABS Take 2 tablets by mouth daily.   Yes [provider]  olmesartan-hydrochlorothiazide (BENICAR HCT) 20-12.5 MG tablet Take 1 tablet by mouth daily. 01/26/18  Yes Roma Schanz R, DO  oxyCODONE (OXY IR/ROXICODONE) 5 MG immediate release tablet Take 7.5 mg by mouth every 6 (six) hours as needed for moderate pain or severe pain.    Yes [provider]  predniSONE (DELTASONE) 10 MG tablet Take 10 mg by mouth as directed. Take 6 tablet for 2 Days Take 5 tablet for 2 Days Take 4 tablets for 2 Days Take 3 tablets for 2 Days Take 2 tablets for 2 Days Take 1 tablet for 2 Days   Yes [provider]    Inpatient Medications: Scheduled Meds: . adenosine      . Chlorhexidine Gluconate Cloth  6 each Topical Daily  . enoxaparin (LOVENOX) injection  40 mg Subcutaneous Q24H  . feeding supplement (PRO-STAT SUGAR FREE 64)  30 mL Oral BID  . hydrocortisone sodium succinate  50 mg Intravenous Q6H  . mouth rinse  15 mL Mouth Rinse BID  . midodrine  5 mg Oral  TID WC  . multivitamin with minerals  1 tablet Oral Daily  . sodium chloride flush  10-40 mL Intracatheter Q12H   Continuous Infusions: . amiodarone 30 mg/hr (04/14/18 1400)  .  ceFAZolin (ANCEF) IV 2 g (04/14/18 1518)  . phenylephrine (NEO-SYNEPHRINE) Adult infusion 18 mcg/min (04/14/18 1400)   PRN Meds: acetaminophen, fentaNYL (SUBLIMAZE) injection, HYDROcodone-acetaminophen, ondansetron (ZOFRAN) IV, sodium chloride flush  Allergies:    Allergies  Allergen Reactions  . Penicillins Other (See Comments)    Fever -when she was 67 yrs old Has patient had a PCN reaction causing immediate rash, facial/tongue/throat  swelling, SOB or lightheadedness with hypotension: NO Has patient had a PCN reaction causing severe rash involving mucus membranes or skin necrosis: Unknown Has patient had a PCN reaction that required hospitalization: Unknown Has patient had a PCN reaction occurring within the last 10 years: No Childhood allergy If all of the above answers are "NO", then may proceed with Cephalosporin use.     Social History:   Social History   Socioeconomic History  . Marital status: Divorced    Spouse name: Not on file  . Number of children: 1  . Years of education: Not on file  . Highest education level: Not on file  Occupational History  . Occupation: Software engineer: [employer]  Social Needs  . Financial resource strain: Not on file  . Food insecurity:    Worry: Not on file    Inability: Not on file  . Transportation needs:    Medical: Not on file    Non-medical: Not on file  Tobacco Use  . Smoking status: Former Smoker    Types: Cigarettes    Last attempt to quit: 02/01/2005    Years since quitting: 13.2  . Smokeless tobacco: Never Used  Substance and Sexual Activity  . Alcohol use: No    Alcohol/week: 0.0 standard drinks  . Drug use: No  . Sexual activity: Yes  Lifestyle  . Physical activity:    Days per week: Not on file    Minutes per session: Not on file  . Stress: Not on file  Relationships  . Social connections:    Talks on phone: Not on file    Gets together: Not on file    Attends religious service: Not on file    Active member of club or organization: Not on file    Attends meetings of clubs or organizations: Not on file    Relationship status: Not on file  . Intimate partner violence:    Fear of current or ex partner: Not on file    Emotionally abused: Not on file    Physically abused: Not on file    Forced sexual activity: Not on file  Other Topics Concern  . Not on file  Social History Narrative   Exercise--  no    Family  History:    Family History  Problem Relation Age of Onset  . Hypertension Mother   . Stroke Mother   . Cancer Mother        lung  . Heart disease Father        cabg  . Cancer Brother        skin     ROS:  Please see the history of present illness.   All other ROS reviewed and negative.     Physical Exam/Data:   Vitals:   04/14/18 1300 04/14/18 1400 04/14/18 1500 04/14/18 1600  BP: 104/74 97/65  100/64   Pulse: (!) 160 (!) 159 (!) 157   Resp: (!) 27 (!) 22 (!) 22   Temp:    98.6 F (37 C)  TempSrc:    Oral  SpO2: 98% 98% 98%   Weight:      Height:        Intake/Output Summary (Last 24 hours) at 04/14/2018 1656 Last data filed at 04/14/2018 1400 Gross per 24 hour  Intake 1368.81 ml  Output 570 ml  Net 798.81 ml   Filed Weights   04/10/18 0300 04/13/18 0500 04/14/18 0500  Weight: 111.5 kg 121.1 kg 122.8 kg   Body mass index is 46.47 kg/m.  General:  Well nourished, well developed, obese, in no acute distress HEENT: normal Lymph: no adenopathy Neck: 6 cm JVD Endocrine:  No thryomegaly Vascular: No carotid bruits; FA pulses 2+ bilaterally without bruits  Cardiac:  normal S1, S2; IReg tachy; no murmur  Lungs:  clear to auscultation bilaterally, no wheezing, rhonchi or rales  Abd: soft, nontender, no hepatomegaly  Ext: no edema Musculoskeletal:  No deformities, BUE and BLE strength normal and equal Skin: warm and dry  Neuro:  CNs 2-12 intact, no focal abnormalities noted Psych:  Normal affect   EKG:  The EKG was personally reviewed and demonstrates:  SVT with occaisional pauses Telemetry:  Telemetry was personally reviewed and demonstrates:  SVT with some sinus beats.  Relevant CV Studies: Echo with preserved LV function  Laboratory Data:  Chemistry Recent Labs  Lab 04/12/18 0236 04/13/18 0107 04/14/18 0632  NA 137 137 139  K 4.0 4.2 4.0  CL 106 106 108  CO2 22 22 25   GLUCOSE 147* 178* 149*  BUN 17 22 25*  CREATININE 0.75 0.81 0.77  CALCIUM  7.4* 8.0* 7.2*  GFRNONAA >60 >60 >60  GFRAA >60 >60 >60  ANIONGAP 9 9 6     Recent Labs  Lab 04/09/18 1801 04/11/18 2223  PROT 6.1* 5.7*  ALBUMIN 2.7* 2.2*  AST 33 25  ALT 25 13  ALKPHOS 72 53  BILITOT 1.3* 1.3*   Hematology Recent Labs  Lab 04/13/18 0107 04/14/18 0500 04/14/18 0632  WBC 27.7* QUESTIONABLE RESULTS, RECOMMEND RECOLLECT TO VERIFY 22.3*  RBC 3.38* QUESTIONABLE RESULTS, RECOMMEND RECOLLECT TO VERIFY 2.92*  HGB 10.1* QUESTIONABLE RESULTS, RECOMMEND RECOLLECT TO VERIFY 8.6*  HCT 32.1* QUESTIONABLE RESULTS, RECOMMEND RECOLLECT TO VERIFY 28.2*  MCV 95.0 QUESTIONABLE RESULTS, RECOMMEND RECOLLECT TO VERIFY 96.6  MCH 29.9 QUESTIONABLE RESULTS, RECOMMEND RECOLLECT TO VERIFY 29.5  MCHC 31.5 QUESTIONABLE RESULTS, RECOMMEND RECOLLECT TO VERIFY 30.5  RDW 14.9 QUESTIONABLE RESULTS, RECOMMEND RECOLLECT TO VERIFY 15.1  PLT 87* QUESTIONABLE RESULTS, RECOMMEND RECOLLECT TO VERIFY 84*   Cardiac Enzymes Recent Labs  Lab 04/12/18 0706 04/12/18 1300 04/13/18 0107  TROPONINI 0.09* 0.08* 0.07*   No results for input(s): TROPIPOC in the last 168 hours.  BNPNo results for input(s): BNP, PROBNP in the last 168 hours.  DDimer No results for input(s): DDIMER in the last 168 hours.  Radiology/Studies:  Dg Arthro Hip Left  Result Date: 04/11/2018 INDICATION: Severe left hip pain.  Clinical concern for septic arthritis. EXAM: ARTHROCENTESIS OF LEFT HIP JOINT UNDER FLUOROSCOPIC GUIDANCE COMPARISON:  Radiographs 04/09/2018 and MRI 04/04/2018 FLUOROSCOPY TIME:  Fluoroscopy Time:  54 seconds Radiation Exposure Index (if provided by the fluoroscopic device): 17.5 mGy Number of Acquired Spot Images: 0 COMPLICATIONS: None immediate. PROCEDURE: Informed written consent was obtained from the patient after discussion of the risks, benefits and alternatives to treatment. The patient  was placed prone on the fluoroscopy table and the left extremity was placed in a slight degree of flexion and internal  rotation. The left hip was localized with fluoroscopy. The skin overlying the anterior aspect of the hip was prepped and draped in usual sterile fashion. A 22 gauge spinal needle was advanced into the hip joint at the lateral aspect of the femoral head-neck junction, after the overlying soft tissues were anesthetized with 1% lidocaine. No fluid could be aspirated. Subsequently, 2 cc Isovue-300 was injected into the joint to document intra-articular location. A fluoroscopic image was saved and sent to PACs. 2 cc of fluid was then aspirated from the left hip joint. The aspirated fluid was sent for laboratory evaluation ordered by referring physician. The needle was removed and a dressing was placed. The patient tolerated procedure well without immediate postprocedural complication. IMPRESSION: Successful fluoroscopic guided aspiration of the left hip. Electronically Signed   By: Marijo Sanes M.D.   On: 04/11/2018 07:55   [place] Ekg Site Rite  Result Date: 04/12/2018 If Site Rite image not attached, placement could not be confirmed due to current cardiac rhythm.   Assessment and Plan:   1. Incessant SVT - she is hemodynamically stable. I have discussed the treatment options with the patient and her daughter. I would suggest IV amio until her SVT resolves then switch to oral amio. If the SVT does not abate, I will plan to proceed with EPS/RFA of atrial flutter on Monday. If she reverts to NSR, I would switch to oral amiodarone within 12 hours of termination at 400 bid. The mechanism of her SVT is almost certainly Atrial tachycardia.      For questions or updates, please contact [place] Please consult [email or web address] for contact info under     Signed, Cristopher Peru, MD  04/14/2018 4:56 PM

## 2018-04-14 NOTE — Progress Notes (Signed)
Nutrition Follow-up  DOCUMENTATION CODES:   Morbid obesity  INTERVENTION:  - Continue Prostat BID.  - Diet re-advancement as medically feasible. - Continue to encourage PO intakes.    NUTRITION DIAGNOSIS:   Increased nutrient needs related to acute illness, wound healing as evidenced by estimated needs. -ongoing  GOAL:   Patient will meet greater than or equal to 90% of their needs -met on average  MONITOR:   PO intake, Supplement acceptance, Weight trends, Labs, Skin  ASSESSMENT:   67 year old female with history of HTN and osteoarthritis. She received a steroid injection into L hip in mid-November and shortly after she started having severe L hip pain. She developed fever 1 week ago which resolved and recently returned. She presented to the ED with L hip pain and urinary frequency. She was found to be hypotensive and highly febrile with temperature of 105 degrees F.  Weight significantly up from 12/9-12/12 and slightly up from 12/12-12/13. Patient has been NPO since midnight pending TEE today 1:30 PM at [facility]. Plan is also for arthrotomy and I&D either later today vs Monday. Patient previously on a Heart Healthy diet and reports eating well without issue. Per review of orders, she has accepted all packets of Prostat since it was ordered on 12/9.   Per Dr. 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   Medications reviewed; 50 mg Solu-cortef QID, 2 g Mg sulfate x1 run 12/12, daily multivitamin with minerals.  Labs reviewed; BUN: 25 mg/dL, Ca: 7.2 mg/dL.     Diet Order:   Diet Order            Diet NPO time specified  Diet effective midnight              EDUCATION NEEDS:   Education needs have been addressed  Skin:  Skin Assessment: Skin Integrity Issues: Skin Integrity Issues:: Stage II Stage II: both sides of sacrum  Last BM:  12/12  Height:   Ht Readings from  Last 1 Encounters:  04/09/18 5' 4" (1.626 m)    Weight:   Wt Readings from Last 1 Encounters:  04/14/18 122.8 kg    Ideal Body Weight:  54.54 kg  BMI:  Body mass index is 46.47 kg/m.  Estimated Nutritional Needs:   Kcal:  2010-2230 kcal  Protein:  120-130 grams  Fluid:  >/= 2 L/day     Jarome Matin, MS, RD, LDN, [facility] Inpatient Clinical Dietitian Pager # [phone] After hours/weekend pager # [phone]

## 2018-04-14 NOTE — Progress Notes (Signed)
Subjective: Patient without complaints this AM. Hip unchanged   Objective: Vital signs in last 24 hours: Temp:  [97.6 F (36.4 C)-98.2 F (36.8 C)] 98 F (36.7 C) (12/13 0400) Pulse Rate:  [32-168] 157 (12/13 0700) Resp:  [0-30] 23 (12/13 0700) BP: (75-114)/(52-79) 81/55 (12/13 0600) SpO2:  [94 %-100 %] 100 % (12/13 0700) Weight:  [122.8 kg] 122.8 kg (12/13 0500)  Intake/Output from previous day: 12/12 0701 - 12/13 0700 In: 3025.9 [P.O.:100; I.V.:2595.2; IV Piggyback:330.7] Out: 770 [Urine:770] Intake/Output this shift: No intake/output data recorded.  Recent Labs    04/12/18 0236 04/13/18 0107 04/14/18 0500  HGB 8.9* 10.1* QUESTIONABLE RESULTS, RECOMMEND RECOLLECT TO VERIFY   Recent Labs    04/13/18 0107 04/14/18 0500  WBC 27.7* QUESTIONABLE RESULTS, RECOMMEND RECOLLECT TO VERIFY  RBC 3.38* QUESTIONABLE RESULTS, RECOMMEND RECOLLECT TO VERIFY  HCT 32.1* QUESTIONABLE RESULTS, RECOMMEND RECOLLECT TO VERIFY  PLT 87* QUESTIONABLE RESULTS, RECOMMEND RECOLLECT TO VERIFY   Recent Labs    04/12/18 0236 04/13/18 0107  NA 137 137  K 4.0 4.2  CL 106 106  CO2 22 22  BUN 17 22  CREATININE 0.75 0.81  GLUCOSE 147* 178*  CALCIUM 7.4* 8.0*   No results for input(s): LABPT, INR in the last 72 hours.  No cellulitis present Compartment soft Pain with passive hip motion  Grew Staph species from hip aspirate when it was reincubated   Assessment/Plan: Sepsis/bacteremia- Culture eventually grew the staph when it was reincubated. Would benefit from arthrotomy and irrigation/debridement but is not stable enough medically to pursue surgery at this point and is set for the TEE today at [facility]. Can potentially have one of my partners do it tomorrow if she is stable medically or I can do it Monday as long as she is stable.   Pilar Plate Natalya Domzalski 04/14/2018, 7:19 AM

## 2018-04-14 NOTE — Progress Notes (Signed)
NAME:  Elizabeth [name], MRN:  [id number], DOB:  [birth year], LOS: 5 ADMISSION DATE:  04/09/2018, CONSULTATION DATE:  04/10/2018 REFERRING MD:  EDP, CHIEF COMPLAINT:  Left hip pain    Brief History   67 y/o female with osteoarthritis of the left hip who underwent an injection of the same on 11/11 (per IR) developed left hip pain on 11/14.  She presented to [facility] in septic shock with worsening left hip pain on 12/9 and was admitted to [facility].  Blood cultures grew staph lugdunesis in 4/4 bottles.  The left hip was aspirated and cultures to date are negative.  Repeat blood cultures are pending.   Past Medical History  Morbid obesity, hypertension, lung cancer resected 2010, aortic stenosis.  Right knee replacement.    Significant Hospital Events   12/08 Admission 12/10 Increased vasopressor requirements, lost IV access.  AI > stress dose steroids started 12/11 TTE aortic vegetation, SVT> amiodarone  Consults:  12/8 Ortho  Procedures:  12/9 Left hip arthrocentesis  Significant Diagnostic Tests:  TTE 12/10 >> calcified aortic valve vegetation , LVEF 60-65%, mild aortic regurge, modereate MR,  Micro Data:  BCx4 12/8 >> staph legdunesis 4/4 Body fluid culture 12/9 >> staph lugdunensis Blood culture 12/10 > negative  Antimicrobials:  Cefepime 12/8 >> 12/9 Flagyl 12/8 >> 12/9  Vanco 12/8 >> 12/10 Cefazolin 12/9 >>  Interim history/subjective:  Feels OK Hip doesn't hurt too badly Remains in SVT, remains hypotensive  Objective   Blood pressure (!) 81/55, pulse (!) 157, temperature 98 F (36.7 C), temperature source Oral, resp. rate (!) 23, height 5\' 4"  (1.626 m), weight 122.8 kg, SpO2 100 %.        Intake/Output Summary (Last 24 hours) at 04/14/2018 0844 Last data filed at 04/14/2018 0200 Gross per 24 hour  Intake 1654.8 ml  Output 770 ml  Net 884.8 ml   Filed Weights   04/10/18 0300 04/13/18 0500 04/14/18 0500  Weight: 111.5 kg 121.1 kg 122.8 kg    Examination:  General:   Morbidly obese, resting comfortably in bed HENT: NCAT OP clear PULM: CTA B, normal effort CV: Tachycardic, no mgr GI: BS+, soft, nontender MSK: normal bulk and tone Neuro: awake, alert, no distress, MAEW      Resolved Hospital Problem list     Assessment & Plan:   Septic Shock: I do not believe shock at this point is due to sepsis as fever curve down and she feels much better overall, favor WBC elevation due to demargination from steroids; I worry hypotension is related to near constant SVT  P: Continue stress dose steroids Check procalcitonin Wean off neosynephrine for MAP > 55, SBP > 80 Continue midodrine Hold off on further saline boluses as she is adequately volume resuscitated Place arterial line today if still hypotensive  Adrenal insufficiency P: Continue hydrocortisone 50mg  IV q6h  Septic arthritis left hip P: Planning left hip wash out when HR under better control  Staph lugdunensis: repeat cultures negative P: Continue cefazolin  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 P: Plan TEE today  AVNRT P: Continue amiodarone Bolus 150mg  IV amiodarone now I'm OK with nodal blockade, we can use neosynephrine if necessary transiently  Hypertension -baseline P: Continue to hold home antihypertensives  Hypokalemia  P: Monitor BMET and UOP Replace electrolytes as needed   Best practice:  Diet: regular diet DVT prophylaxis: enoxaparin GI prophylaxis: n/a Glucose control: n/a Mobility: per ortho Code Status: full Family Communication:  Patient and daughter updated at bedside Disposition:  remain in ICU  Labs   CBC: Recent Labs  Lab 04/09/18 1600  04/11/18 0313 04/12/18 0236 04/13/18 0107 04/14/18 0500 04/14/18 0632  WBC 15.4*   < > 17.1* 21.3* 27.7* QUESTIONABLE RESULTS, RECOMMEND RECOLLECT TO VERIFY 22.3*  NEUTROABS 13.5*  --  13.8*  --   --  QUESTIONABLE RESULTS, RECOMMEND RECOLLECT TO VERIFY 19.2*  HGB 9.7*   < > 8.6* 8.9* 10.1* QUESTIONABLE  RESULTS, RECOMMEND RECOLLECT TO VERIFY 8.6*  HCT 30.0*   < > 28.1* 29.8* 32.1* QUESTIONABLE RESULTS, RECOMMEND RECOLLECT TO VERIFY 28.2*  MCV 92.0   < > 95.3 100.0 95.0 QUESTIONABLE RESULTS, RECOMMEND RECOLLECT TO VERIFY 96.6  PLT 83*   < > 65* 68* 87* QUESTIONABLE RESULTS, RECOMMEND RECOLLECT TO VERIFY 84*   < > = values in this interval not displayed.    Basic Metabolic Panel: Recent Labs  Lab 04/11/18 0313 04/11/18 2223 04/12/18 0236 04/12/18 1228 04/13/18 0107 04/14/18 0632  NA 138 136 137  --  137 139  K 3.2* 4.2 4.0  --  4.2 4.0  CL 106 105 106  --  106 108  CO2 24 22 22   --  22 25  GLUCOSE 111* 149* 147*  --  178* 149*  BUN 19 15 17   --  22 25*  CREATININE 0.87 0.78 0.75  --  0.81 0.77  CALCIUM 7.6* 7.6* 7.4*  --  8.0* 7.2*  MG  --   --   --  1.8 1.9  --    GFR: Estimated Creatinine Clearance: 87 mL/min (by C-G formula based on SCr of 0.77 mg/dL). Recent Labs  Lab 04/09/18 1618 04/09/18 2030  04/12/18 0236 04/13/18 0107 04/14/18 0500 04/14/18 0632  WBC  --   --    < > 21.3* 27.7* QUESTIONABLE RESULTS, RECOMMEND RECOLLECT TO VERIFY 22.3*  LATICACIDVEN 1.84 1.30  --   --   --   --   --    < > = values in this interval not displayed.    Liver Function Tests: Recent Labs  Lab 04/09/18 1801 04/11/18 2223  AST 33 25  ALT 25 13  ALKPHOS 72 53  BILITOT 1.3* 1.3*  PROT 6.1* 5.7*  ALBUMIN 2.7* 2.2*   No results for input(s): LIPASE, AMYLASE in the last 168 hours. No results for input(s): AMMONIA in the last 168 hours.  ABG    Component Value Date/Time   PHART 7.305 (L) 10/19/2008 0354   PCO2ART 57.0 (H) 10/19/2008 0354   PO2ART 85.0 10/19/2008 0354   HCO3 28.2 (H) 10/19/2008 0354   TCO2 30 10/19/2008 0354   O2SAT 95.0 10/19/2008 0354     Coagulation Profile: No results for input(s): INR, PROTIME in the last 168 hours.  Cardiac Enzymes: Recent Labs  Lab 04/12/18 0706 04/12/18 1300 04/13/18 0107  TROPONINI 0.09* 0.08* 0.07*    HbA1C: Hgb A1c  MFr Bld  Date/Time Value Ref Range Status  11/23/2017 11:17 AM 5.5 4.6 - 6.5 % Final    Comment:    Glycemic Control Guidelines for People with Diabetes:Non Diabetic:  <6%Goal of Therapy: <7%Additional Action Suggested:  >8%   01/05/2017 11:31 AM 5.5 4.8 - 5.6 % Final    Comment:             Prediabetes: 5.7 - 6.4          Diabetes: >6.4          Glycemic control for adults with diabetes: <7.0     CBG:  Recent Labs  Lab 04/12/18 1201  GLUCAP 188*       Critical care time: 30 minutes     Roselie Awkward, MD [place] Pager: [phone] Cell: [phone] If no response, call [phone]  04/14/2018, 8:44 AM

## 2018-04-14 NOTE — Anesthesia Preprocedure Evaluation (Deleted)
Anesthesia Evaluation    Reviewed: Allergy & Precautions, Patient's Chart, lab work & pertinent test results  History of Anesthesia Complications Negative for: history of anesthetic complications  Airway        Dental   Pulmonary former smoker,   S/p RUL lobectomy for lung cancer          Cardiovascular hypertension, + Valvular Problems/Murmurs      Neuro/Psych negative neurological ROS  negative psych ROS   GI/Hepatic negative GI ROS, Neg liver ROS,   Endo/Other  Morbid obesity  Renal/GU negative Renal ROS     Musculoskeletal  (+) Arthritis , Osteoarthritis,    Abdominal   Peds  Hematology  (+) anemia ,  Thrombocytopenia Leukocytosis    Anesthesia Other Findings   Reproductive/Obstetrics                             Anesthesia Physical Anesthesia Plan  ASA: III  Anesthesia Plan: MAC   Post-op Pain Management:    Induction: Intravenous  PONV Risk Score and Plan: 2 and Propofol infusion and Treatment may vary due to age or medical condition  Airway Management Planned: Nasal Cannula and Natural Airway  Additional Equipment: None  Intra-op Plan:   Post-operative Plan:   Informed Consent:   Plan Discussed with: CRNA and Anesthesiologist  Anesthesia Plan Comments:         Anesthesia Quick Evaluation

## 2018-04-14 NOTE — Progress Notes (Signed)
eLink Physician-Brief Progress Note Patient Name: Elizabeth [name] DOB: January 15, 1951 MRN: [id number]   Date of Service  04/14/2018  HPI/Events of Note  Notified of patient feeling nauseated despite Zofran.  Also requesting for something for sleep.  On assessment patient claims she is able to take pills.  eICU Interventions  Phenergan 25 mg PO ordered.  Informed patient and bedside nurse this has a potential side effect of causing drowsiness, will defer additional sleep aids for now     Intervention Category Minor Interventions: Routine modifications to care plan (e.g. PRN medications for pain, fever)  Elizabeth [name] 04/14/2018, 8:19 PM

## 2018-04-14 NOTE — Progress Notes (Addendum)
Progress Note  Patient Name: Elizabeth [name] Date of Encounter: 04/14/2018  Primary Cardiologist: Skeet Latch, MD   Subjective   Continues with elevated HR in the 120s-160s but asymptomatic. No CP or  dyspnea. Daughter present at bedside.   Inpatient Medications    Scheduled Meds: . Chlorhexidine Gluconate Cloth  6 each Topical Daily  . enoxaparin (LOVENOX) injection  40 mg Subcutaneous Q24H  . feeding supplement (PRO-STAT SUGAR FREE 64)  30 mL Oral BID  . hydrocortisone sodium succinate  50 mg Intravenous Q6H  . mouth rinse  15 mL Mouth Rinse BID  . midodrine  5 mg Oral TID WC  . multivitamin with minerals  1 tablet Oral Daily  . sodium chloride flush  10-40 mL Intracatheter Q12H   Continuous Infusions: . amiodarone 30 mg/hr (04/14/18 0333)  . amiodarone    .  ceFAZolin (ANCEF) IV Stopped (04/14/18 0556)  . phenylephrine (NEO-SYNEPHRINE) Adult infusion 18 mcg/min (04/14/18 0100)   PRN Meds: acetaminophen, fentaNYL (SUBLIMAZE) injection, HYDROcodone-acetaminophen, ondansetron (ZOFRAN) IV, sodium chloride flush   Vital Signs    Vitals:   04/14/18 0600 04/14/18 0630 04/14/18 0700 04/14/18 0800  BP: (!) 81/55     Pulse: (!) 37 (!) 141 (!) 157 (!) 151  Resp: 17 (!) 24 (!) 23 13  Temp:    98 F (36.7 C)  TempSrc:    Oral  SpO2: 99% 99% 100% 98%  Weight:      Height:        Intake/Output Summary (Last 24 hours) at 04/14/2018 1011 Last data filed at 04/14/2018 0200 Gross per 24 hour  Intake 1370.68 ml  Output 770 ml  Net 600.68 ml   Filed Weights   04/10/18 0300 04/13/18 0500 04/14/18 0500  Weight: 111.5 kg 121.1 kg 122.8 kg    Telemetry    SVT 120s-160s - Personally Reviewed  ECG    Not performed today - Personally Reviewed  Physical Exam   GEN: Morbidly obese WF in No acute distress.   Neck: No JVD Cardiac: tachy rate, no murmurs, rubs, or gallops.  Respiratory: Clear to auscultation bilaterally. GI: Soft, nontender, non-distended  MS: No  edema; No deformity. Neuro:  Nonfocal  Psych: Normal affect   Labs    Chemistry Recent Labs  Lab 04/09/18 1801  04/11/18 2223 04/12/18 0236 04/13/18 0107 04/14/18 0632  NA 136   < > 136 137 137 139  K 3.5   < > 4.2 4.0 4.2 4.0  CL 99   < > 105 106 106 108  CO2 25   < > 22 22 22 25   GLUCOSE 104*   < > 149* 147* 178* 149*  BUN 49*   < > 15 17 22  25*  CREATININE 1.40*   < > 0.78 0.75 0.81 0.77  CALCIUM 7.9*   < > 7.6* 7.4* 8.0* 7.2*  PROT 6.1*  --  5.7*  --   --   --   ALBUMIN 2.7*  --  2.2*  --   --   --   AST 33  --  25  --   --   --   ALT 25  --  13  --   --   --   ALKPHOS 72  --  53  --   --   --   BILITOT 1.3*  --  1.3*  --   --   --   GFRNONAA 39*   < > >60 >60 >60 >60  GFRAA 45*   < > >60 >60 >60 >60  ANIONGAP 12   < > 9 9 9 6    < > = values in this interval not displayed.     Hematology Recent Labs  Lab 04/13/18 0107 04/14/18 0500 04/14/18 0632  WBC 27.7* QUESTIONABLE RESULTS, RECOMMEND RECOLLECT TO VERIFY 22.3*  RBC 3.38* QUESTIONABLE RESULTS, RECOMMEND RECOLLECT TO VERIFY 2.92*  HGB 10.1* QUESTIONABLE RESULTS, RECOMMEND RECOLLECT TO VERIFY 8.6*  HCT 32.1* QUESTIONABLE RESULTS, RECOMMEND RECOLLECT TO VERIFY 28.2*  MCV 95.0 QUESTIONABLE RESULTS, RECOMMEND RECOLLECT TO VERIFY 96.6  MCH 29.9 QUESTIONABLE RESULTS, RECOMMEND RECOLLECT TO VERIFY 29.5  MCHC 31.5 QUESTIONABLE RESULTS, RECOMMEND RECOLLECT TO VERIFY 30.5  RDW 14.9 QUESTIONABLE RESULTS, RECOMMEND RECOLLECT TO VERIFY 15.1  PLT 87* QUESTIONABLE RESULTS, RECOMMEND RECOLLECT TO VERIFY 84*    Cardiac Enzymes Recent Labs  Lab 04/12/18 0706 04/12/18 1300 04/13/18 0107  TROPONINI 0.09* 0.08* 0.07*   No results for input(s): TROPIPOC in the last 168 hours.   BNPNo results for input(s): BNP, PROBNP in the last 168 hours.   DDimer No results for input(s): DDIMER in the last 168 hours.   Radiology    No results found.  Cardiac Studies   2D Echo 04/12/18 Study Conclusions  - Left ventricle: The  cavity size was normal. Systolic function was normal. The estimated ejection fraction was in the range of 60% to 65%. Wall motion was normal; there were no regional wall motion abnormalities. - Aortic valve: Trileaflet; severely thickened, severely calcified leaflets. 1.5 x 2 cm calcified aortic mass noted (possible old calcified vegetation). This is new when compared to prior echocardiogram. Valve mobility was restricted. There was mild regurgitation. - Mitral valve: There was moderate regurgitation. - Tricuspid valve: There was moderate regurgitation.  TEE- pending   Patient Profile     67 year old female with past medical history of hypertension, hyperlipidemia, previous lung cancer status post lobectomy and mild aortic stenosis. Patient admitted December 8 with left hip pain, fever and hypotension. She is being treated for septic arthritis. Cardiology consulted for SVT. Also found to have an aortic mass noted on TTE (possible old calcified vegetation).  Assessment & Plan    1. Septic Arthritis Involving the Hip/Bacteremia: Blood cultures grew staph lugdunesis in 4/4 bottles. On antibiotics. Management per ortho. TTE 04/12/18 showed 1.5 x 2 cm calcified aortic mass noted (possible old calcified vegetation). This is new when compared to prior echocardiogram. Mild AI, moderate MR, moderate TR. Given bacteremia, she will need a TEE. This is scheduled for today at [facility] at 1:30 PM. She is currently on neosynephrine for BP support. SBPs currently in the upper 90s. Will check with Dr. Stanford Breed to see if we should still pursue TEE today.  2. SVT: ? AVNRT vs PAT. This is in the setting of sepsis and bacteremia. Despite HR, she denies palpitations. No CP or dyspnea.  TTE yesterday showed normal LVEF. K is WNL. She has had some hypotension this admit, which has limited use of ? blockers and CCBs. Continue amiodarone. Continue to treat sepsis and bacteremia.    For questions or  updates, please contact [place] Please consult [email or web address] for contact info under        Signed, Lyda Jester, PA-C  04/14/2018, 10:11 AM    As above, patient seen and examined.  She denies dyspnea or chest pain.  She also denies palpitations.  She remains in incessant intermittent SVT.  Extremely difficult situation.  She remains hypotensive requiring Neo-Synephrine for blood  pressure support.  Dr. Lake Bells feels that this is not sepsis at this point as she is afebrile and her procalcitonin is now normal.  She has been treated with steroids for possible adrenal insufficiency.  He feels SVT could be contributing to hypotension which is certainly possible.  Because of hypotension I cannot treat with beta-blockade or calcium blocker.  We will continue with amiodarone (and she has been rebolused today).  Patient also has intermittent sinus beats and therefore cardioversion would not be helpful.  Note LV function is normal.  I have discussed the patient with Dr. Lovena Le and he will see.  Given incessant SVT and hypotension I have rescheduled transesophageal echocardiogram for Monday when she is more stable.  Kirk Ruths, MD

## 2018-04-15 DIAGNOSIS — I330 Acute and subacute infective endocarditis: Secondary | ICD-10-CM

## 2018-04-15 DIAGNOSIS — Z95828 Presence of other vascular implants and grafts: Secondary | ICD-10-CM

## 2018-04-15 DIAGNOSIS — I959 Hypotension, unspecified: Secondary | ICD-10-CM

## 2018-04-15 DIAGNOSIS — E2740 Unspecified adrenocortical insufficiency: Secondary | ICD-10-CM

## 2018-04-15 DIAGNOSIS — R0600 Dyspnea, unspecified: Secondary | ICD-10-CM

## 2018-04-15 LAB — CBC WITH DIFFERENTIAL/PLATELET
Abs Immature Granulocytes: 0.91 10*3/uL — ABNORMAL HIGH (ref 0.00–0.07)
BASOS PCT: 0 %
Basophils Absolute: 0.1 10*3/uL (ref 0.0–0.1)
Eosinophils Absolute: 0 10*3/uL (ref 0.0–0.5)
Eosinophils Relative: 0 %
HCT: 30.5 % — ABNORMAL LOW (ref 36.0–46.0)
Hemoglobin: 9.5 g/dL — ABNORMAL LOW (ref 12.0–15.0)
Immature Granulocytes: 4 %
Lymphocytes Relative: 8 %
Lymphs Abs: 1.8 10*3/uL (ref 0.7–4.0)
MCH: 29.6 pg (ref 26.0–34.0)
MCHC: 31.1 g/dL (ref 30.0–36.0)
MCV: 95 fL (ref 80.0–100.0)
Monocytes Absolute: 1.1 10*3/uL — ABNORMAL HIGH (ref 0.1–1.0)
Monocytes Relative: 5 %
NEUTROS PCT: 83 %
Neutro Abs: 18.6 10*3/uL — ABNORMAL HIGH (ref 1.7–7.7)
Platelets: 76 10*3/uL — ABNORMAL LOW (ref 150–400)
RBC: 3.21 MIL/uL — ABNORMAL LOW (ref 3.87–5.11)
RDW: 15.2 % (ref 11.5–15.5)
WBC: 22.5 10*3/uL — ABNORMAL HIGH (ref 4.0–10.5)
nRBC: 0.4 % — ABNORMAL HIGH (ref 0.0–0.2)

## 2018-04-15 LAB — BASIC METABOLIC PANEL
Anion gap: 7 (ref 5–15)
BUN: 29 mg/dL — ABNORMAL HIGH (ref 8–23)
CO2: 27 mmol/L (ref 22–32)
Calcium: 7.8 mg/dL — ABNORMAL LOW (ref 8.9–10.3)
Chloride: 103 mmol/L (ref 98–111)
Creatinine, Ser: 0.9 mg/dL (ref 0.44–1.00)
Glucose, Bld: 145 mg/dL — ABNORMAL HIGH (ref 70–99)
Potassium: 4.4 mmol/L (ref 3.5–5.1)
Sodium: 137 mmol/L (ref 135–145)

## 2018-04-15 LAB — PROCALCITONIN: Procalcitonin: 0.18 ng/mL

## 2018-04-15 MED ORDER — MIDODRINE HCL 5 MG PO TABS
10.0000 mg | ORAL_TABLET | Freq: Three times a day (TID) | ORAL | Status: DC
  Administered 2018-04-15 – 2018-04-24 (×24): 10 mg via ORAL
  Filled 2018-04-15 (×24): qty 2

## 2018-04-15 NOTE — Progress Notes (Signed)
NAME:  Elizabeth [name], MRN:  [id number], DOB:  Apr 10, 1951, LOS: 6 ADMISSION DATE:  04/09/2018, CONSULTATION DATE:  04/10/2018 REFERRING MD:  EDP, CHIEF COMPLAINT:  Left hip pain    Brief History   67 y/o female with osteoarthritis of the left hip who underwent an injection of the same on 11/11 (per IR) developed left hip pain on 11/14.  She presented to [facility] in septic shock with worsening left hip pain on 12/9 and was admitted to [facility].  Blood cultures grew staph lugdunesis in 4/4 bottles.  The left hip was aspirated and cultures to date are negative.  Repeat blood cultures are pending.   Past Medical History  Morbid obesity, hypertension, lung cancer resected 2010, aortic stenosis.  Right knee replacement.    Significant Hospital Events   12/08 Admission 12/10 Increased vasopressor requirements, lost IV access.  AI > stress dose steroids started 12/11 TTE aortic vegetation, SVT> amiodarone  Consults:  12/8 Ortho  Procedures:  12/9 Left hip arthrocentesis  Significant Diagnostic Tests:  TTE 12/10 >> calcified aortic valve vegetation , LVEF 60-65%, mild aortic regurge, modereate MR,  Micro Data:  BCx4 12/8 >> staph legdunesis 4/4 Body fluid culture 12/9 >> staph lugdunensis Blood culture 12/10 > negative  Antimicrobials:  Cefepime 12/8 >> 12/9 Flagyl 12/8 >> 12/9  Vanco 12/8 >> 12/10 Cefazolin 12/9 >>  Interim history/subjective:  Less periods of SVT overnight HR better controlled Remains on neosynephrine  Objective   Blood pressure (!) 89/54, pulse 65, temperature 98.2 F (36.8 C), temperature source Oral, resp. rate (!) 21, height 5\' 4"  (1.626 m), weight 123.9 kg, SpO2 99 %.        Intake/Output Summary (Last 24 hours) at 04/15/2018 0845 Last data filed at 04/15/2018 9371 Gross per 24 hour  Intake 1384.19 ml  Output 350 ml  Net 1034.19 ml   Filed Weights   04/13/18 0500 04/14/18 0500 04/15/18 0455  Weight: 121.1 kg 122.8 kg 123.9 kg    Examination:  General:   Morbidly obese, resting comfortably in bed HENT: NCAT OP clear PULM: CTA B, normal effort CV: periods of tachycardia, no mgr GI: BS+, soft, nontender MSK: normal bulk and tone Neuro: awake, alert, no distress, MAEW    Resolved Hospital Problem list     Assessment & Plan:   Hypotension: at this point no evidence of septic shock as fever has improved, cultures clearing > complicated by relative adrenal insufficiency  P: Wean off neo for MAP > 55, SBP > 80 Increase midodrine Hold off on further saline boluses  Adrenal insufficiency P: Continue hydrocortisone 50mg  IV q6h  Septic arthritis left hip P: Planning left hip washout on 12/16 assuming atrial tachycardia better controlled  Atrial tachycardia causing SVT P: Continue amiodarone per cardiology and EP Tele  Staph lugdunensis bacteremia: repeat cultures negative P: Continue cefazoline  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 P: Plan TEE on 12/16  Hypertension -baseline P: Continue to hold home antihypertenisve  Hypokalemia  P: Monitor BMET and UOP Replace electrolytes as needed  Thrombocytopenia: stable Monitor for bleeding   Best practice:  Diet: regular diet DVT prophylaxis: enoxaparin GI prophylaxis: n/a Glucose control: n/a Mobility: per ortho Code Status: full Family Communication:  Patient and daughter updated at bedside Disposition: remain in ICU  Labs   CBC: Recent Labs  Lab 04/09/18 1600  04/11/18 0313 04/12/18 0236 04/13/18 0107 04/14/18 0500 04/14/18 0632 04/15/18 0500  WBC 15.4*   < > 17.1* 21.3* 27.7* QUESTIONABLE RESULTS, RECOMMEND RECOLLECT  TO VERIFY 22.3* 22.5*  NEUTROABS 13.5*  --  13.8*  --   --  QUESTIONABLE RESULTS, RECOMMEND RECOLLECT TO VERIFY 19.2* 18.6*  HGB 9.7*   < > 8.6* 8.9* 10.1* QUESTIONABLE RESULTS, RECOMMEND RECOLLECT TO VERIFY 8.6* 9.5*  HCT 30.0*   < > 28.1* 29.8* 32.1* QUESTIONABLE RESULTS, RECOMMEND RECOLLECT TO VERIFY 28.2* 30.5*  MCV 92.0   < > 95.3  100.0 95.0 QUESTIONABLE RESULTS, RECOMMEND RECOLLECT TO VERIFY 96.6 95.0  PLT 83*   < > 65* 68* 87* QUESTIONABLE RESULTS, RECOMMEND RECOLLECT TO VERIFY 84* 76*   < > = values in this interval not displayed.    Basic Metabolic Panel: Recent Labs  Lab 04/11/18 2223 04/12/18 0236 04/12/18 1228 04/13/18 0107 04/14/18 0632 04/15/18 0500  NA 136 137  --  137 139 137  K 4.2 4.0  --  4.2 4.0 4.4  CL 105 106  --  106 108 103  CO2 22 22  --  22 25 27   GLUCOSE 149* 147*  --  178* 149* 145*  BUN 15 17  --  22 25* 29*  CREATININE 0.78 0.75  --  0.81 0.77 0.90  CALCIUM 7.6* 7.4*  --  8.0* 7.2* 7.8*  MG  --   --  1.8 1.9  --   --    GFR: Estimated Creatinine Clearance: 77.8 mL/min (by C-G formula based on SCr of 0.9 mg/dL). Recent Labs  Lab 04/09/18 1618 04/09/18 2030  04/13/18 0107 04/14/18 0500 04/14/18 0632 04/14/18 1015 04/15/18 0500  PROCALCITON  --   --   --   --   --   --  0.24 0.18  WBC  --   --    < > 27.7* QUESTIONABLE RESULTS, RECOMMEND RECOLLECT TO VERIFY 22.3*  --  22.5*  LATICACIDVEN 1.84 1.30  --   --   --   --   --   --    < > = values in this interval not displayed.    Liver Function Tests: Recent Labs  Lab 04/09/18 1801 04/11/18 2223  AST 33 25  ALT 25 13  ALKPHOS 72 53  BILITOT 1.3* 1.3*  PROT 6.1* 5.7*  ALBUMIN 2.7* 2.2*   No results for input(s): LIPASE, AMYLASE in the last 168 hours. No results for input(s): AMMONIA in the last 168 hours.  ABG    Component Value Date/Time   PHART 7.305 (L) 10/19/2008 0354   PCO2ART 57.0 (H) 10/19/2008 0354   PO2ART 85.0 10/19/2008 0354   HCO3 28.2 (H) 10/19/2008 0354   TCO2 30 10/19/2008 0354   O2SAT 95.0 10/19/2008 0354     Coagulation Profile: No results for input(s): INR, PROTIME in the last 168 hours.  Cardiac Enzymes: Recent Labs  Lab 04/12/18 0706 04/12/18 1300 04/13/18 0107  TROPONINI 0.09* 0.08* 0.07*    HbA1C: Hgb A1c MFr Bld  Date/Time Value Ref Range Status  11/23/2017 11:17 AM 5.5  4.6 - 6.5 % Final    Comment:    Glycemic Control Guidelines for People with Diabetes:Non Diabetic:  <6%Goal of Therapy: <7%Additional Action Suggested:  >8%   01/05/2017 11:31 AM 5.5 4.8 - 5.6 % Final    Comment:             Prediabetes: 5.7 - 6.4          Diabetes: >6.4          Glycemic control for adults with diabetes: <7.0     CBG: Recent Labs  Lab 04/12/18 1201  GLUCAP 188*       Critical care time: 30 minutes     Roselie Awkward, MD [place] Pager: [phone] Cell: [phone] If no response, call [phone]  04/15/2018, 8:45 AM

## 2018-04-15 NOTE — Progress Notes (Addendum)
[facility] for Infectious Disease    Date of Admission:  04/09/2018   Total days of antibiotics 7        Day 6 cefazolin   ID: Elizabeth [name] is a 67 y.o. female with sepsis due to staph ludgunensis bacteremia 2/2 left hip septic arthritis possibly related to recent steroid injection course c/b atrial tachycardia and adrenal insufficiency Active Problems:   Left hip pain   Septic arthritis (HCC)   Pressure injury of skin   Chest tightness   Fever    Subjective: Afebrile, still moderate hip pain, seen by EP cardiology for further recs for SVT, remains on low dose pressors. Complains of SOB when turning to the side. Medications:  . Chlorhexidine Gluconate Cloth  6 each Topical Daily  . enoxaparin (LOVENOX) injection  40 mg Subcutaneous Q24H  . feeding supplement (PRO-STAT SUGAR FREE 64)  30 mL Oral BID  . hydrocortisone sodium succinate  50 mg Intravenous Q6H  . mouth rinse  15 mL Mouth Rinse BID  . midodrine  10 mg Oral TID WC  . multivitamin with minerals  1 tablet Oral Daily  . sodium chloride flush  10-40 mL Intracatheter Q12H    Objective: Vital signs in last 24 hours: Temp:  [97.6 F (36.4 C)-98.6 F (37 C)] 97.6 F (36.4 C) (12/14 1200) Pulse Rate:  [35-161] 35 (12/14 1333) Resp:  [18-32] 24 (12/14 0845) BP: (88-125)/(47-94) 99/54 (12/14 1333) SpO2:  [97 %-100 %] 99 % (12/14 1333) Weight:  [123.9 kg] 123.9 kg (12/14 0455)  Physical Exam  Constitutional:  oriented to person, place, and time. appears chronically ill and well-nourished. No distress.  HENT: [place]/AT, PERRLA, no scleral icterus Mouth/Throat: Oropharynx is clear and moist. No oropharyngeal exudate.  Cardiovascular: Normal rate, regular rhythm and normal heart sounds. Exam reveals no gallop and no friction rub.  No murmur heard.  Pulmonary/Chest: Effort normal and breath sounds normal. No respiratory distress.  has no wheezes.  Neck = supple, no nuchal rigidity Abdominal: Soft. Bowel sounds are  normal.  exhibits no distension. There is no tenderness.  Ext: right arm PICC LINE is c/d/i: pitting edema +1 lower extremities Lymphadenopathy: no cervical adenopathy. No axillary adenopathy Neurological: alert and oriented to person, place, and time.  Skin: Skin is warm and dry.scattered echymosis  Psychiatric: a normal mood and affect.  behavior is normal.    Lab Results Recent Labs    04/14/18 0632 04/15/18 0500  WBC 22.3* 22.5*  HGB 8.6* 9.5*  HCT 28.2* 30.5*  NA 139 137  K 4.0 4.4  CL 108 103  CO2 25 27  BUN 25* 29*  CREATININE 0.77 0.90   Lab Results  Component Value Date   ESRSEDRATE 53 (H) 04/09/2018   Lab Results  Component Value Date   CRP 17.7 (H) 04/09/2018    Microbiology: 12/10 blood cx ngtd 12/9 hip aspirate + s. ludgunensis Studies/Results: No results found.   Assessment/Plan:  Sepsis due to staph ludgunensis bacteremia with likely septic arthritis which is suspected to be a complication of the injection 03/13/18= S.ludgunensis is thought to be as virulent at staph aureus. Will likely need wash out of left hip once she is clinically stable. For now continue on cefazolin. TTE showing calcified AV - suspect this is old. May consider getting TEE once clinically stable.  Arrhythmia/SVT - atrial tachycardia= currently on Iv amiodarone. HR appears improved but switches in/out of NSR while watching telemetry during this visit. If it persists,  Dr Lovena Le to possibly  proceed with EPS/RFA of atrial flutter on Monday.  Hypotension = maps improved >70. On low dose currently pressors plus stress dose steroids for adrenal insufficiency.  Septic arthritis = planned for washout on Monday, hopefully can coordinate intra-op TEE at that time. Anticipate to treat for 4-6 wk of IV cefazolin.   Dyspnea at rest= continue to monitor. Unclear if it correlates to her HR as it did initially. On exam, decrease BS at base. May benefit from IS.  [facility]  for Infectious Diseases Cell: [phone] Pager: [phone]  04/15/2018, 1:43 PM

## 2018-04-15 NOTE — Progress Notes (Signed)
Progress Note  Patient Name: Elizabeth [name] Date of Encounter: 04/15/2018  Primary Cardiologist: Dr. Skeet Latch  Subjective   Vague sense of intermittent palpitations.  No chest pain.  Inpatient Medications    Scheduled Meds: . Chlorhexidine Gluconate Cloth  6 each Topical Daily  . enoxaparin (LOVENOX) injection  40 mg Subcutaneous Q24H  . feeding supplement (PRO-STAT SUGAR FREE 64)  30 mL Oral BID  . hydrocortisone sodium succinate  50 mg Intravenous Q6H  . mouth rinse  15 mL Mouth Rinse BID  . midodrine  5 mg Oral TID WC  . multivitamin with minerals  1 tablet Oral Daily  . sodium chloride flush  10-40 mL Intracatheter Q12H   Continuous Infusions: . amiodarone 30 mg/hr (04/15/18 0400)  .  ceFAZolin (ANCEF) IV Stopped (04/15/18 0534)  . phenylephrine (NEO-SYNEPHRINE) Adult infusion 18 mcg/min (04/15/18 0636)   PRN Meds: acetaminophen, fentaNYL (SUBLIMAZE) injection, HYDROcodone-acetaminophen, ondansetron (ZOFRAN) IV, promethazine, sodium chloride flush   Vital Signs    Vitals:   04/15/18 0400 04/15/18 0455 04/15/18 0600 04/15/18 0700  BP: (!) 107/56  (!) 112/49 125/83  Pulse: 62  65 65  Resp: (!) 22  (!) 22 (!) 21  Temp:      TempSrc:      SpO2: 99%  98% 100%  Weight:  123.9 kg    Height:        Intake/Output Summary (Last 24 hours) at 04/15/2018 0743 Last data filed at 04/15/2018 0400 Gross per 24 hour  Intake 1540.22 ml  Output 700 ml  Net 840.22 ml   Filed Weights   04/13/18 0500 04/14/18 0500 04/15/18 0455  Weight: 121.1 kg 122.8 kg 123.9 kg    Telemetry    Sinus rhythm with bursts of SVT. Personally reviewed.  ECG    Tracing from 04/13/2018 shows sinus rhythm with PACs and nonspecific ST-T changes.  Personally reviewed.  Physical Exam   GEN:  Morbidly obese woman.  No acute distress.   Neck: No JVD. Cardiac: RRR with intermittent bursts of ectopy, 2/6 systolic murmur. Respiratory: Nonlabored. Clear to auscultation bilaterally. GI:   Obese, nontender, bowel sounds present. MS: No edema; No deformity. Neuro:  Nonfocal. Psych: Alert and oriented x 3. Normal affect.  Labs    Chemistry Recent Labs  Lab 04/09/18 1801  04/11/18 2223  04/13/18 0107 04/14/18 0632 04/15/18 0500  NA 136   < > 136   < > 137 139 137  K 3.5   < > 4.2   < > 4.2 4.0 4.4  CL 99   < > 105   < > 106 108 103  CO2 25   < > 22   < > 22 25 27   GLUCOSE 104*   < > 149*   < > 178* 149* 145*  BUN 49*   < > 15   < > 22 25* 29*  CREATININE 1.40*   < > 0.78   < > 0.81 0.77 0.90  CALCIUM 7.9*   < > 7.6*   < > 8.0* 7.2* 7.8*  PROT 6.1*  --  5.7*  --   --   --   --   ALBUMIN 2.7*  --  2.2*  --   --   --   --   AST 33  --  25  --   --   --   --   ALT 25  --  13  --   --   --   --  ALKPHOS 72  --  53  --   --   --   --   BILITOT 1.3*  --  1.3*  --   --   --   --   GFRNONAA 39*   < > >60   < > >60 >60 >60  GFRAA 45*   < > >60   < > >60 >60 >60  ANIONGAP 12   < > 9   < > 9 6 7    < > = values in this interval not displayed.     Hematology Recent Labs  Lab 04/14/18 0500 04/14/18 0632 04/15/18 0500  WBC QUESTIONABLE RESULTS, RECOMMEND RECOLLECT TO VERIFY 22.3* 22.5*  RBC QUESTIONABLE RESULTS, RECOMMEND RECOLLECT TO VERIFY 2.92* 3.21*  HGB QUESTIONABLE RESULTS, RECOMMEND RECOLLECT TO VERIFY 8.6* 9.5*  HCT QUESTIONABLE RESULTS, RECOMMEND RECOLLECT TO VERIFY 28.2* 30.5*  MCV QUESTIONABLE RESULTS, RECOMMEND RECOLLECT TO VERIFY 96.6 95.0  MCH QUESTIONABLE RESULTS, RECOMMEND RECOLLECT TO VERIFY 29.5 29.6  MCHC QUESTIONABLE RESULTS, RECOMMEND RECOLLECT TO VERIFY 30.5 31.1  RDW QUESTIONABLE RESULTS, RECOMMEND RECOLLECT TO VERIFY 15.1 15.2  PLT QUESTIONABLE RESULTS, RECOMMEND RECOLLECT TO VERIFY 84* 76*    Cardiac Enzymes Recent Labs  Lab 04/12/18 0706 04/12/18 1300 04/13/18 0107  TROPONINI 0.09* 0.08* 0.07*   No results for input(s): TROPIPOC in the last 168 hours.   Radiology    No results found.  Cardiac Studies   Echocardiogram  04/12/2018: Study Conclusions  - Left ventricle: The cavity size was normal. Systolic function was   normal. The estimated ejection fraction was in the range of 60%   to 65%. Wall motion was normal; there were no regional wall   motion abnormalities. - Aortic valve: Trileaflet; severely thickened, severely calcified   leaflets. 1.5 x 2 cm calcified aortic mass noted (possible old   calcified vegetation). This is new when compared to prior   echocardiogram. Valve mobility was restricted. There was mild   regurgitation. - Mitral valve: There was moderate regurgitation. - Tricuspid valve: There was moderate regurgitation.  Patient Profile     67 y.o. female with past medical history of hypertension, hyperlipidemia, previous lung cancer status post lobectomyandmild aortic stenosis.Patient admitted December 8 with left hip pain, fever and hypotension. She is being treated for septic arthritis.Cardiology consulted for SVT.Also found to have an aortic mass noted on TTE (possible old calcified vegetation).  Assessment & Plan    1.  Recurring SVT, becoming less frequent on IV amiodarone but not prolonged.  Patient seen by Dr. Lovena Le with suspicion of an atrial tachycardia..  ECG shows possible retrograde P wave in the early ST segment raising the possibility of AVRT as well.  2.  Septic arthritis, continues on antibiotics per primary team.  Transthoracic echocardiogram shows a possible old calcified valve vegetation with plan for confirmatory TEE on Monday.  Blood cultures positive for STAPHYLOCOCCUS LUGDUNENSIS.  I reviewed the patient's chart and cardiology plan.  Would continue IV amiodarone today, hopefully SVT will continue to settle down and we might be able to consider converting to oral regimen tomorrow.  Possibility of radiofrequency ablation can be considered on Monday if rhythm persists.  She is also scheduled for a TEE on Monday.  Follow-up ECG in a.m.  Signed, Rozann Lesches,  MD  04/15/2018, 7:43 AM

## 2018-04-15 NOTE — Progress Notes (Signed)
    Subjective:  Patient reports pain as mild to moderate. TEE rescheduled on Monday.  Objective:   VITALS:   Vitals:   04/15/18 0800 04/15/18 0815 04/15/18 0820 04/15/18 0830  BP: (!) 88/47 (!) 117/94 123/78 (!) 89/54  Pulse: (!) 52 67 66 65  Resp: 20 (!) 22 (!) 23 (!) 21  Temp: 98.2 F (36.8 C)     TempSrc: Oral     SpO2: 99% 100% 100% 99%  Weight:      Height:        NAD Sensation intact distally Intact pulses distally Dorsiflexion/Plantar flexion intact   Lab Results  Component Value Date   WBC 22.5 (H) 04/15/2018   HGB 9.5 (L) 04/15/2018   HCT 30.5 (L) 04/15/2018   MCV 95.0 04/15/2018   PLT 76 (L) 04/15/2018   BMET    Component Value Date/Time   NA 137 04/15/2018 0500   NA 143 01/05/2017 1131   K 4.4 04/15/2018 0500   K 4.4 01/05/2017 1131   CL 103 04/15/2018 0500   CL 104 10/02/2012 0949   CO2 27 04/15/2018 0500   CO2 31 (H) 01/05/2017 1131   GLUCOSE 145 (H) 04/15/2018 0500   GLUCOSE 99 01/05/2017 1131   GLUCOSE 104 (H) 10/02/2012 0949   BUN 29 (H) 04/15/2018 0500   BUN 10.2 01/05/2017 1131   CREATININE 0.90 04/15/2018 0500   CREATININE 0.83 11/11/2017 1636   CREATININE 0.9 01/05/2017 1131   CALCIUM 7.8 (L) 04/15/2018 0500   CALCIUM 10.2 01/05/2017 1131   GFRNONAA >60 04/15/2018 0500   GFRAA >60 04/15/2018 0500     Assessment/Plan:     Active Problems:   Left hip pain   Septic arthritis (HCC)   Pressure injury of skin   Chest tightness   Fever  NSA L hip with STAPHYLOCOCCUS LUGDUNENSIS: HR improving, discussed with Dr. [place] Bells who recommends waiting until Monday before proceeding with I&D of hip. Discussed with Dr. Wynelle Link. Continue current care.    Elizabeth [name] 04/15/2018, 8:39 AM   Rod Can, MD Cell: [phone] [facility] now [facility]  [place] [address], [place], [place],  [address] Phone: [phone] [email or web address] Facebook  [employer]

## 2018-04-16 ENCOUNTER — Inpatient Hospital Stay (HOSPITAL_COMMUNITY): Payer: Medicare Other

## 2018-04-16 LAB — BLOOD GAS, ARTERIAL
Acid-Base Excess: 1.6 mmol/L (ref 0.0–2.0)
BICARBONATE: 25.6 mmol/L (ref 20.0–28.0)
DRAWN BY: 308601
O2 Content: 3 L/min
O2 Saturation: 95.6 %
Patient temperature: 98.6
pCO2 arterial: 39.7 mmHg (ref 32.0–48.0)
pH, Arterial: 7.424 (ref 7.350–7.450)
pO2, Arterial: 83.4 mmHg (ref 83.0–108.0)

## 2018-04-16 LAB — BASIC METABOLIC PANEL
Anion gap: 7 (ref 5–15)
Anion gap: 6 (ref 5–15)
BUN: 34 mg/dL — ABNORMAL HIGH (ref 8–23)
BUN: 32 mg/dL — ABNORMAL HIGH (ref 8–23)
CALCIUM: 8.1 mg/dL — AB (ref 8.9–10.3)
CO2: 27 mmol/L (ref 22–32)
CO2: 27 mmol/L (ref 22–32)
Calcium: 7.9 mg/dL — ABNORMAL LOW (ref 8.9–10.3)
Chloride: 103 mmol/L (ref 98–111)
Chloride: 104 mmol/L (ref 98–111)
Creatinine, Ser: 0.93 mg/dL (ref 0.44–1.00)
Creatinine, Ser: 0.93 mg/dL (ref 0.44–1.00)
GFR calc Af Amer: >60 mL/min (ref >60)
GFR calc Af Amer: >60 mL/min (ref >60)
GFR calc non Af Amer: >60 mL/min (ref >60)
GFR calc non Af Amer: >60 mL/min (ref >60)
GLUCOSE: 150 mg/dL — AB (ref 70–99)
Glucose, Bld: 156 mg/dL — ABNORMAL HIGH (ref 70–99)
Potassium: 4.6 mmol/L (ref 3.5–5.1)
Potassium: 4.6 mmol/L (ref 3.5–5.1)
Sodium: 137 mmol/L (ref 135–145)
Sodium: 137 mmol/L (ref 135–145)

## 2018-04-16 LAB — CBC WITH DIFFERENTIAL/PLATELET
Abs Immature Granulocytes: 0.66 10*3/uL — ABNORMAL HIGH (ref 0.00–0.07)
Basophils Absolute: 0 10*3/uL (ref 0.0–0.1)
Basophils Relative: 0 %
Eosinophils Absolute: 0 10*3/uL (ref 0.0–0.5)
Eosinophils Relative: 0 %
HCT: 30 % — ABNORMAL LOW (ref 36.0–46.0)
Hemoglobin: 9.3 g/dL — ABNORMAL LOW (ref 12.0–15.0)
Immature Granulocytes: 4 %
LYMPHS ABS: 1.8 10*3/uL (ref 0.7–4.0)
Lymphocytes Relative: 10 %
MCH: 30.3 pg (ref 26.0–34.0)
MCHC: 31 g/dL (ref 30.0–36.0)
MCV: 97.7 fL (ref 80.0–100.0)
Monocytes Absolute: 0.7 10*3/uL (ref 0.1–1.0)
Monocytes Relative: 4 %
Neutro Abs: 14.6 10*3/uL — ABNORMAL HIGH (ref 1.7–7.7)
Neutrophils Relative %: 82 %
Platelets: 64 10*3/uL — ABNORMAL LOW (ref 150–400)
RBC: 3.07 MIL/uL — ABNORMAL LOW (ref 3.87–5.11)
RDW: 15.2 % (ref 11.5–15.5)
WBC: 17.8 10*3/uL — ABNORMAL HIGH (ref 4.0–10.5)
nRBC: 0.3 % — ABNORMAL HIGH (ref 0.0–0.2)

## 2018-04-16 LAB — PROCALCITONIN: Procalcitonin: 0.13 ng/mL

## 2018-04-16 MED ORDER — METOPROLOL TARTRATE 5 MG/5ML IV SOLN
2.5000 mg | Freq: Four times a day (QID) | INTRAVENOUS | Status: DC
  Administered 2018-04-17 – 2018-04-18 (×2): 2.5 mg via INTRAVENOUS
  Filled 2018-04-16 (×3): qty 5

## 2018-04-16 MED ORDER — SODIUM CHLORIDE 0.9 % IV SOLN
INTRAVENOUS | Status: DC
  Administered 2018-04-16: 02:00:00 via INTRAVENOUS

## 2018-04-16 MED ORDER — ASPIRIN 81 MG PO CHEW
81.0000 mg | CHEWABLE_TABLET | Freq: Every day | ORAL | Status: DC
  Administered 2018-04-16 – 2018-04-23 (×8): 81 mg via ORAL
  Filled 2018-04-16 (×8): qty 1

## 2018-04-16 MED ORDER — FUROSEMIDE 10 MG/ML IJ SOLN
40.0000 mg | Freq: Once | INTRAMUSCULAR | Status: AC
  Administered 2018-04-16: 40 mg via INTRAVENOUS
  Filled 2018-04-16: qty 4

## 2018-04-16 NOTE — Progress Notes (Addendum)
eLink Physician-Brief Progress Note Patient Name: Elizabeth [name] DOB: Sep 11, 1950 MRN: [id number]   Date of Service  04/16/2018  HPI/Events of Note  Patient c/o SOB. Sat = 96% on 2 L/min [place] O2 and RR = 22.  eICU Interventions  Will order: 1. Portable CXR STAT. 2. ABG STAT. 3. 12 Lead EKG STAT.        Sommer,Steven Cornelia Copa 04/16/2018, 10:25 PM

## 2018-04-16 NOTE — Progress Notes (Signed)
NAME:  ADER FRITZE, MRN:  [id number], DOB:  [birth year], LOS: 7 ADMISSION DATE:  04/09/2018, CONSULTATION DATE:  04/10/2018 REFERRING MD:  EDP, CHIEF COMPLAINT:  Left hip pain    Brief History   67 y/o female with osteoarthritis of the left hip who underwent an injection of the same on 11/11 (per IR) developed left hip pain on 11/14.  She presented to [facility] in septic shock with worsening left hip pain on 12/9 and was admitted to [facility].  Blood cultures grew staph lugdunesis in 4/4 bottles.  The left hip was aspirated and cultures to date are negative.  Repeat blood cultures are pending.   Past Medical History  Morbid obesity, hypertension, lung cancer resected 2010, aortic stenosis.  Right knee replacement.    Significant Hospital Events   12/08 Admission 12/10 Increased vasopressor requirements, lost IV access.  AI > stress dose steroids started 12/11 TTE aortic vegetation, SVT> amiodarone  Consults:  12/8 Ortho  Procedures:  12/9 Left hip arthrocentesis  Significant Diagnostic Tests:  TTE 12/10 >> calcified aortic valve vegetation , LVEF 60-65%, mild aortic regurge, modereate MR,  Micro Data:  BCx4 12/8 >> staph legdunesis 4/4 Body fluid culture 12/9 >> staph lugdunensis Blood culture 12/10 > negative  Antimicrobials:  Cefepime 12/8 >> 12/9 Flagyl 12/8 >> 12/9  Vanco 12/8 >> 12/10 Cefazolin 12/9 >>  Interim history/subjective:  Much fewer episodes of SVT Off neosynephrine  Objective   Blood pressure 122/78, pulse (!) 33, temperature 97.7 F (36.5 C), temperature source Oral, resp. rate (!) 21, height 5\' 4"  (1.626 m), weight 125.6 kg, SpO2 100 %.        Intake/Output Summary (Last 24 hours) at 04/16/2018 0723 Last data filed at 04/16/2018 0400 Gross per 24 hour  Intake 854.4 ml  Output 400 ml  Net 454.4 ml   Filed Weights   04/14/18 0500 04/15/18 0455 04/16/18 0405  Weight: 122.8 kg 123.9 kg 125.6 kg    Examination:  General:  Morbidly obese, chronically ill  appeearing, resting comfortably in bed HENT: NCAT OP clear PULM: CTA B, normal effort CV: RRR, no mgr GI: BS+, soft, nontender MSK: normal bulk and tone Neuro: awake, alert, no distress, MAEW   Resolved Hospital Problem list     Assessment & Plan:   Hypotension: at this point no evidence of septic shock as fever has improved, cultures clearing > complicated by relative adrenal insufficiency P: Continue midodrine MAP goal > 55, SBP goal > 80 (assuming normal mental status and UOP)  Adrenal insufficiency P: Continue hydrocortisone 50mg  IV q6h  Septic arthritis left hip P: Planning hip washout on 12/16 by Dr. 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 P: Plan TEE on 12/16  Hypertension -baseline P: Continue to hold home antihypertensives  Hypokalemia  P: Monitor BMET and UOP Replace electrolytes as needed  Thrombocytopenia: stable Monitor for bleeding  Physical deconditioning: GET OUT OF BED   Best practice:  Diet: regular diet DVT prophylaxis: enoxaparin GI prophylaxis: n/a Glucose control: n/a Mobility: per ortho Code Status: full Family Communication:  Patient updated bedside Disposition: SDU status, transfer to [place]   CBC: Recent Labs  Lab 04/11/18 0313  04/13/18 0107 04/14/18 0500 04/14/18 0632 04/15/18 0500 04/16/18 0403  WBC 17.1*   < > 27.7* QUESTIONABLE RESULTS, RECOMMEND RECOLLECT TO VERIFY 22.3* 22.5* 17.8*  NEUTROABS 13.8*  --   --  QUESTIONABLE RESULTS, RECOMMEND RECOLLECT TO VERIFY 19.2* 18.6* 14.6*  HGB 8.6*   < > 10.1* QUESTIONABLE RESULTS, RECOMMEND RECOLLECT TO VERIFY 8.6* 9.5* 9.3*  HCT 28.1*   < > 32.1* QUESTIONABLE RESULTS, RECOMMEND RECOLLECT TO VERIFY 28.2* 30.5* 30.0*  MCV 95.3   < > 95.0 QUESTIONABLE RESULTS, RECOMMEND RECOLLECT TO VERIFY 96.6 95.0 97.7  PLT 65*   < > 87*  QUESTIONABLE RESULTS, RECOMMEND RECOLLECT TO VERIFY 84* 76* 64*   < > = values in this interval not displayed.    Basic Metabolic Panel: Recent Labs  Lab 04/12/18 0236 04/12/18 1228 04/13/18 0107 04/14/18 0632 04/15/18 0500 04/16/18 0403  NA 137  --  137 139 137 137  K 4.0  --  4.2 4.0 4.4 4.6  CL 106  --  106 108 103 104  CO2 22  --  22 25 27 27   GLUCOSE 147*  --  178* 149* 145* 150*  BUN 17  --  22 25* 29* 32*  CREATININE 0.75  --  0.81 0.77 0.90 0.93  CALCIUM 7.4*  --  8.0* 7.2* 7.8* 7.9*  MG  --  1.8 1.9  --   --   --    GFR: Estimated Creatinine Clearance: 76 mL/min (by C-G formula based on SCr of 0.93 mg/dL). Recent Labs  Lab 04/09/18 1618 04/09/18 2030  04/14/18 0500 04/14/18 0632 04/14/18 1015 04/15/18 0500 04/16/18 0403  PROCALCITON  --   --   --   --   --  0.24 0.18 0.13  WBC  --   --    < > QUESTIONABLE RESULTS, RECOMMEND RECOLLECT TO VERIFY 22.3*  --  22.5* 17.8*  LATICACIDVEN 1.84 1.30  --   --   --   --   --   --    < > = values in this interval not displayed.    Liver Function Tests: Recent Labs  Lab 04/09/18 1801 04/11/18 2223  AST 33 25  ALT 25 13  ALKPHOS 72 53  BILITOT 1.3* 1.3*  PROT 6.1* 5.7*  ALBUMIN 2.7* 2.2*   No results for input(s): LIPASE, AMYLASE in the last 168 hours. No results for input(s): AMMONIA in the last 168 hours.  ABG    Component Value Date/Time   PHART 7.305 (L) 10/19/2008 0354   PCO2ART 57.0 (H) 10/19/2008 0354   PO2ART 85.0 10/19/2008 0354   HCO3 28.2 (H) 10/19/2008 0354   TCO2 30 10/19/2008 0354   O2SAT 95.0 10/19/2008 0354     Coagulation Profile: No results for input(s): INR, PROTIME in the last 168 hours.  Cardiac Enzymes: Recent Labs  Lab 04/12/18 0706 04/12/18 1300 04/13/18 0107  TROPONINI 0.09* 0.08* 0.07*    HbA1C: Hgb A1c MFr Bld  Date/Time Value Ref Range Status  11/23/2017 11:17 AM 5.5 4.6 - 6.5 % Final    Comment:    Glycemic Control Guidelines for People with Diabetes:Non  Diabetic:  <6%Goal of Therapy: <7%Additional Action Suggested:  >8%   01/05/2017 11:31 AM 5.5 4.8 - 5.6 % Final    Comment:             Prediabetes: 5.7 - 6.4          Diabetes: >6.4          Glycemic control for adults with diabetes: <7.0     CBG: Recent Labs  Lab 04/12/18 1201  [place] care time:  none     [facility]  L[place] Bells, MD [place] [facility] Pager: [phone] Cell: [phone] If no response, call [phone]  04/16/2018, 7:23 AM

## 2018-04-16 NOTE — Progress Notes (Signed)
Progress Note  Patient Name: Elizabeth [name] Date of Encounter: 04/16/2018  Primary Cardiologist: Dr. Skeet Latch  Subjective   No palpitations or chest pain at rest.  Inpatient Medications    Scheduled Meds: . Chlorhexidine Gluconate Cloth  6 each Topical Daily  . enoxaparin (LOVENOX) injection  40 mg Subcutaneous Q24H  . feeding supplement (PRO-STAT SUGAR FREE 64)  30 mL Oral BID  . hydrocortisone sodium succinate  50 mg Intravenous Q6H  . mouth rinse  15 mL Mouth Rinse BID  . midodrine  10 mg Oral TID WC  . multivitamin with minerals  1 tablet Oral Daily  . sodium chloride flush  10-40 mL Intracatheter Q12H   Continuous Infusions: . sodium chloride 20 mL/hr at 04/16/18 0400  . amiodarone 30 mg/hr (04/16/18 0505)  .  ceFAZolin (ANCEF) IV Stopped (04/16/18 0539)  . phenylephrine (NEO-SYNEPHRINE) Adult infusion Stopped (04/15/18 1755)   PRN Meds: acetaminophen, fentaNYL (SUBLIMAZE) injection, HYDROcodone-acetaminophen, ondansetron (ZOFRAN) IV, promethazine, sodium chloride flush   Vital Signs    Vitals:   04/16/18 0430 04/16/18 0500 04/16/18 0700 04/16/18 0730  BP: 120/84 122/78 122/75 (!) 118/51  Pulse: (!) 28 (!) 33 (!) 29 61  Resp: (!) 22 (!) 21 (!) 23 (!) 22  Temp:   (!) 97.4 F (36.3 C)   TempSrc:   Oral   SpO2: 100% 100% 100% 100%  Weight:      Height:        Intake/Output Summary (Last 24 hours) at 04/16/2018 0804 Last data filed at 04/16/2018 0400 Gross per 24 hour  Intake 854.4 ml  Output 400 ml  Net 454.4 ml   Filed Weights   04/14/18 0500 04/15/18 0455 04/16/18 0405  Weight: 122.8 kg 123.9 kg 125.6 kg    Telemetry    Sinus rhythm with PACs and brief bursts of SVT.  Personally reviewed.  ECG    Tracing from 04/16/2018 shows sinus rhythm with occasional PVCs.  Personally reviewed.  Physical Exam   GEN:  Morbidly obese woman.  No acute distress.   Neck: No JVD. Cardiac: RRR with ectopy, 2/6 systolic murmur.  Respiratory:  Nonlabored. Clear to auscultation bilaterally. GI: Soft, nontender, bowel sounds present. MS: No edema; No deformity. Neuro:  Nonfocal. Psych: Alert and oriented x 3. Normal affect.  Labs    Chemistry Recent Labs  Lab 04/09/18 1801  04/11/18 2223  04/14/18 0632 04/15/18 0500 04/16/18 0403  NA 136   < > 136   < > 139 137 137  K 3.5   < > 4.2   < > 4.0 4.4 4.6  CL 99   < > 105   < > 108 103 104  CO2 25   < > 22   < > 25 27 27   GLUCOSE 104*   < > 149*   < > 149* 145* 150*  BUN 49*   < > 15   < > 25* 29* 32*  CREATININE 1.40*   < > 0.78   < > 0.77 0.90 0.93  CALCIUM 7.9*   < > 7.6*   < > 7.2* 7.8* 7.9*  PROT 6.1*  --  5.7*  --   --   --   --   ALBUMIN 2.7*  --  2.2*  --   --   --   --   AST 33  --  25  --   --   --   --   ALT 25  --  13  --   --   --   --   ALKPHOS 72  --  53  --   --   --   --   BILITOT 1.3*  --  1.3*  --   --   --   --   GFRNONAA 39*   < > >60   < > >60 >60 >60  GFRAA 45*   < > >60   < > >60 >60 >60  ANIONGAP 12   < > 9   < > 6 7 6    < > = values in this interval not displayed.     Hematology Recent Labs  Lab 04/14/18 0632 04/15/18 0500 04/16/18 0403  WBC 22.3* 22.5* 17.8*  RBC 2.92* 3.21* 3.07*  HGB 8.6* 9.5* 9.3*  HCT 28.2* 30.5* 30.0*  MCV 96.6 95.0 97.7  MCH 29.5 29.6 30.3  MCHC 30.5 31.1 31.0  RDW 15.1 15.2 15.2  PLT 84* 76* 64*    Cardiac Enzymes Recent Labs  Lab 04/12/18 0706 04/12/18 1300 04/13/18 0107  TROPONINI 0.09* 0.08* 0.07*   No results for input(s): TROPIPOC in the last 168 hours.   Radiology    No results found.  Cardiac Studies   Echocardiogram 04/12/2018: Study Conclusions  - Left ventricle: The cavity size was normal. Systolic function was normal. The estimated ejection fraction was in the range of 60% to 65%. Wall motion was normal; there were no regional wall motion abnormalities. - Aortic valve: Trileaflet; severely thickened, severely calcified leaflets. 1.5 x 2 cm calcified aortic mass noted  (possible old calcified vegetation). This is new when compared to prior echocardiogram. Valve mobility was restricted. There was mild regurgitation. - Mitral valve: There was moderate regurgitation. - Tricuspid valve: There was moderate regurgitation.  Patient Profile     67 y.o. female with past medical history of hypertension, hyperlipidemia, previous lung cancer status post lobectomyandmild aortic stenosis.Patient admitted December 8 with left hip pain, fever and hypotension. She is being treated for septic arthritis.Cardiology consulted for SVT.Also found to have an aortic mass noted on TTE (possible old calcified vegetation).  Assessment & Plan    1.  Recurrent SVT, reasonably well managed on IV amiodarone with brief bursts but largely sinus rhythm.  ECG shows possible retrograde P wave in the early ST segment during SVT raising the possibility of AVRT, however patient seen by EP with suspicion that it may be an atrial tachycardia.  2.  Septic arthritis, continues on antibiotics per primary team and scheduled tentatively for I&D of left hip in OR tomorrow.  Blood cultures are positive for STAPHYLOCOCCUS LUGDUNENSIS.  Echocardiogram shows possible old calcified aortic valve vegetation with plan for TEE tomorrow as well.  Not certain if TEE could be coordinated with orthopedic OR procedure - I will message our scheduling staff about this possibility.  Otherwise would continue IV amiodarone since this is providing reasonable control of SVT and she has upcoming procedures tomorrow.  Once these are complete, conversion to oral amiodarone can be considered.  Signed, Rozann Lesches, MD  04/16/2018, 8:04 AM

## 2018-04-16 NOTE — H&P (View-Only) (Signed)
Progress Note  Patient Name: Elizabeth [name] Date of Encounter: 04/16/2018  Primary Cardiologist: Dr. Skeet Latch  Subjective   No palpitations or chest pain at rest.  Inpatient Medications    Scheduled Meds: . Chlorhexidine Gluconate Cloth  6 each Topical Daily  . enoxaparin (LOVENOX) injection  40 mg Subcutaneous Q24H  . feeding supplement (PRO-STAT SUGAR FREE 64)  30 mL Oral BID  . hydrocortisone sodium succinate  50 mg Intravenous Q6H  . mouth rinse  15 mL Mouth Rinse BID  . midodrine  10 mg Oral TID WC  . multivitamin with minerals  1 tablet Oral Daily  . sodium chloride flush  10-40 mL Intracatheter Q12H   Continuous Infusions: . sodium chloride 20 mL/hr at 04/16/18 0400  . amiodarone 30 mg/hr (04/16/18 0505)  .  ceFAZolin (ANCEF) IV Stopped (04/16/18 0539)  . phenylephrine (NEO-SYNEPHRINE) Adult infusion Stopped (04/15/18 1755)   PRN Meds: acetaminophen, fentaNYL (SUBLIMAZE) injection, HYDROcodone-acetaminophen, ondansetron (ZOFRAN) IV, promethazine, sodium chloride flush   Vital Signs    Vitals:   04/16/18 0430 04/16/18 0500 04/16/18 0700 04/16/18 0730  BP: 120/84 122/78 122/75 (!) 118/51  Pulse: (!) 28 (!) 33 (!) 29 61  Resp: (!) 22 (!) 21 (!) 23 (!) 22  Temp:   (!) 97.4 F (36.3 C)   TempSrc:   Oral   SpO2: 100% 100% 100% 100%  Weight:      Height:        Intake/Output Summary (Last 24 hours) at 04/16/2018 0804 Last data filed at 04/16/2018 0400 Gross per 24 hour  Intake 854.4 ml  Output 400 ml  Net 454.4 ml   Filed Weights   04/14/18 0500 04/15/18 0455 04/16/18 0405  Weight: 122.8 kg 123.9 kg 125.6 kg    Telemetry    Sinus rhythm with PACs and brief bursts of SVT.  Personally reviewed.  ECG    Tracing from 04/16/2018 shows sinus rhythm with occasional PVCs.  Personally reviewed.  Physical Exam   GEN:  Morbidly obese woman.  No acute distress.   Neck: No JVD. Cardiac: RRR with ectopy, 2/6 systolic murmur.  Respiratory:  Nonlabored. Clear to auscultation bilaterally. GI: Soft, nontender, bowel sounds present. MS: No edema; No deformity. Neuro:  Nonfocal. Psych: Alert and oriented x 3. Normal affect.  Labs    Chemistry Recent Labs  Lab 04/09/18 1801  04/11/18 2223  04/14/18 0632 04/15/18 0500 04/16/18 0403  NA 136   < > 136   < > 139 137 137  K 3.5   < > 4.2   < > 4.0 4.4 4.6  CL 99   < > 105   < > 108 103 104  CO2 25   < > 22   < > 25 27 27   GLUCOSE 104*   < > 149*   < > 149* 145* 150*  BUN 49*   < > 15   < > 25* 29* 32*  CREATININE 1.40*   < > 0.78   < > 0.77 0.90 0.93  CALCIUM 7.9*   < > 7.6*   < > 7.2* 7.8* 7.9*  PROT 6.1*  --  5.7*  --   --   --   --   ALBUMIN 2.7*  --  2.2*  --   --   --   --   AST 33  --  25  --   --   --   --   ALT 25  --  13  --   --   --   --   ALKPHOS 72  --  53  --   --   --   --   BILITOT 1.3*  --  1.3*  --   --   --   --   GFRNONAA 39*   < > >60   < > >60 >60 >60  GFRAA 45*   < > >60   < > >60 >60 >60  ANIONGAP 12   < > 9   < > 6 7 6    < > = values in this interval not displayed.     Hematology Recent Labs  Lab 04/14/18 0632 04/15/18 0500 04/16/18 0403  WBC 22.3* 22.5* 17.8*  RBC 2.92* 3.21* 3.07*  HGB 8.6* 9.5* 9.3*  HCT 28.2* 30.5* 30.0*  MCV 96.6 95.0 97.7  MCH 29.5 29.6 30.3  MCHC 30.5 31.1 31.0  RDW 15.1 15.2 15.2  PLT 84* 76* 64*    Cardiac Enzymes Recent Labs  Lab 04/12/18 0706 04/12/18 1300 04/13/18 0107  TROPONINI 0.09* 0.08* 0.07*   No results for input(s): TROPIPOC in the last 168 hours.   Radiology    No results found.  Cardiac Studies   Echocardiogram 04/12/2018: Study Conclusions  - Left ventricle: The cavity size was normal. Systolic function was normal. The estimated ejection fraction was in the range of 60% to 65%. Wall motion was normal; there were no regional wall motion abnormalities. - Aortic valve: Trileaflet; severely thickened, severely calcified leaflets. 1.5 x 2 cm calcified aortic mass noted  (possible old calcified vegetation). This is new when compared to prior echocardiogram. Valve mobility was restricted. There was mild regurgitation. - Mitral valve: There was moderate regurgitation. - Tricuspid valve: There was moderate regurgitation.  Patient Profile     67 y.o. female with past medical history of hypertension, hyperlipidemia, previous lung cancer status post lobectomyandmild aortic stenosis.Patient admitted December 8 with left hip pain, fever and hypotension. She is being treated for septic arthritis.Cardiology consulted for SVT.Also found to have an aortic mass noted on TTE (possible old calcified vegetation).  Assessment & Plan    1.  Recurrent SVT, reasonably well managed on IV amiodarone with brief bursts but largely sinus rhythm.  ECG shows possible retrograde P wave in the early ST segment during SVT raising the possibility of AVRT, however patient seen by EP with suspicion that it may be an atrial tachycardia.  2.  Septic arthritis, continues on antibiotics per primary team and scheduled tentatively for I&D of left hip in OR tomorrow.  Blood cultures are positive for STAPHYLOCOCCUS LUGDUNENSIS.  Echocardiogram shows possible old calcified aortic valve vegetation with plan for TEE tomorrow as well.  Not certain if TEE could be coordinated with orthopedic OR procedure - I will message our scheduling staff about this possibility.  Otherwise would continue IV amiodarone since this is providing reasonable control of SVT and she has upcoming procedures tomorrow.  Once these are complete, conversion to oral amiodarone can be considered.  Signed, Rozann Lesches, MD  04/16/2018, 8:04 AM

## 2018-04-16 NOTE — Progress Notes (Signed)
eLink Physician-Brief Progress Note Patient Name: Elizabeth [name] DOB: 1950-10-21 MRN: [id number]   Date of Service  04/16/2018  HPI/Events of Note  12 lead EKG - Sinus rhythm with 1st degree AV block, Possible L atrial enlargement and non specific ST-T changes. CXR - Mild pulmonary edema. BP = 132/76   eICU Interventions  Will order: 1. Cycle Troponin 2. ASA 81 mg PO now and Q day. 3. Metoprolol 2.5 mg IV now and Q 6 hours. Hold for HR < 60 or SBP < 100.  4. Lasix 40 mg IV X 1 now.      Intervention Category Major Interventions: Other:  Lysle Dingwall 04/16/2018, 11:12 PM

## 2018-04-16 NOTE — Progress Notes (Signed)
   Subjective: Recheck left hip for septic arthritis Pt scheduled to have TEE at [facility] tomorrow at unknown time Heart rate has been improved over the past 2 days Will await clearance prior to surgery for I&D of left hip but tentatively scheduled for tomorrow afternoon Pt denies any new symptoms or issues  Patient reports pain as mild.  Objective:   VITALS:   Vitals:   04/16/18 0700 04/16/18 0730  BP: 122/75 (!) 118/51  Pulse: (!) 29 61  Resp: (!) 23 (!) 22  Temp: (!) 97.4 F (36.3 C)   SpO2: 100% 100%    Left lower extremity with slight external rotation for comfort Not taken thru any rom  nv intact distally  LABS Recent Labs    04/14/18 0632 04/15/18 0500 04/16/18 0403  HGB 8.6* 9.5* 9.3*  HCT 28.2* 30.5* 30.0*  WBC 22.3* 22.5* 17.8*  PLT 84* 76* 64*    Recent Labs    04/14/18 0632 04/15/18 0500 04/16/18 0403  NA 139 137 137  K 4.0 4.4 4.6  BUN 25* 29* 32*  CREATININE 0.77 0.90 0.93  GLUCOSE 149* 145* 150*     Assessment/Plan: Septic arthritis left hip Pt to have TEE at [facility] tomorrow and currently scheduled for I&D of left hip tomorrow afternoon with Dr. Wynelle Link if cleared NPO after midnight Will continue to monitor her progress     Merla Riches PA-C, [place] [facility] [employer] [facility] [address] [place], [place], [place] [address] Phone: [phone] [email or web address] Facebook  [employer]

## 2018-04-17 ENCOUNTER — Inpatient Hospital Stay (HOSPITAL_COMMUNITY): Payer: Medicare Other | Admitting: Anesthesiology

## 2018-04-17 ENCOUNTER — Inpatient Hospital Stay (HOSPITAL_COMMUNITY): Payer: Medicare Other

## 2018-04-17 ENCOUNTER — Encounter (HOSPITAL_COMMUNITY)
Admission: EM | Disposition: A | Discharge status: Skilled Nursing Facility | Payer: Self-pay | Source: Home / Self Care | Attending: Surgery

## 2018-04-17 ENCOUNTER — Inpatient Hospital Stay (HOSPITAL_COMMUNITY)
Admission: EM | Disposition: A | Discharge status: Skilled Nursing Facility | Payer: Self-pay | Source: Home / Self Care | Attending: Surgery

## 2018-04-17 ENCOUNTER — Encounter (HOSPITAL_COMMUNITY): Payer: Self-pay | Admitting: *Deleted

## 2018-04-17 DIAGNOSIS — B958 Unspecified staphylococcus as the cause of diseases classified elsewhere: Secondary | ICD-10-CM

## 2018-04-17 DIAGNOSIS — M01X0 Direct infection of unspecified joint in infectious and parasitic diseases classified elsewhere: Secondary | ICD-10-CM

## 2018-04-17 DIAGNOSIS — I352 Nonrheumatic aortic (valve) stenosis with insufficiency: Secondary | ICD-10-CM

## 2018-04-17 DIAGNOSIS — B957 Other staphylococcus as the cause of diseases classified elsewhere: Secondary | ICD-10-CM

## 2018-04-17 DIAGNOSIS — I38 Endocarditis, valve unspecified: Secondary | ICD-10-CM

## 2018-04-17 DIAGNOSIS — I330 Acute and subacute infective endocarditis: Secondary | ICD-10-CM

## 2018-04-17 DIAGNOSIS — D696 Thrombocytopenia, unspecified: Secondary | ICD-10-CM

## 2018-04-17 DIAGNOSIS — M00052 Staphylococcal arthritis, left hip: Secondary | ICD-10-CM

## 2018-04-17 DIAGNOSIS — D638 Anemia in other chronic diseases classified elsewhere: Secondary | ICD-10-CM

## 2018-04-17 DIAGNOSIS — I39 Endocarditis and heart valve disorders in diseases classified elsewhere: Secondary | ICD-10-CM

## 2018-04-17 DIAGNOSIS — I080 Rheumatic disorders of both mitral and aortic valves: Secondary | ICD-10-CM

## 2018-04-17 DIAGNOSIS — I340 Nonrheumatic mitral (valve) insufficiency: Secondary | ICD-10-CM

## 2018-04-17 DIAGNOSIS — A498 Other bacterial infections of unspecified site: Secondary | ICD-10-CM

## 2018-04-17 HISTORY — PX: INCISION AND DRAINAGE HIP: SHX1801

## 2018-04-17 HISTORY — PX: TEE WITHOUT CARDIOVERSION: SHX5443

## 2018-04-17 LAB — TROPONIN I
Troponin I: 0.05 ng/mL (ref <0.03)
Troponin I: 0.05 ng/mL (ref <0.03)
Troponin I: 0.06 ng/mL (ref <0.03)

## 2018-04-17 SURGERY — ECHOCARDIOGRAM, TRANSESOPHAGEAL
Anesthesia: Monitor Anesthesia Care

## 2018-04-17 SURGERY — IRRIGATION AND DEBRIDEMENT HIP
Anesthesia: General | Site: Hip | Laterality: Left

## 2018-04-17 MED ORDER — SUGAMMADEX SODIUM 500 MG/5ML IV SOLN
INTRAVENOUS | Status: DC | PRN
  Administered 2018-04-17: 250 mg via INTRAVENOUS

## 2018-04-17 MED ORDER — PROPOFOL 10 MG/ML IV BOLUS
INTRAVENOUS | Status: AC
  Filled 2018-04-17: qty 20

## 2018-04-17 MED ORDER — VITAMINS A & D EX OINT
TOPICAL_OINTMENT | CUTANEOUS | Status: AC
  Filled 2018-04-17: qty 5

## 2018-04-17 MED ORDER — FENTANYL CITRATE (PF) 100 MCG/2ML IJ SOLN
INTRAMUSCULAR | Status: DC | PRN
  Administered 2018-04-17 (×5): 50 ug via INTRAVENOUS

## 2018-04-17 MED ORDER — SUGAMMADEX SODIUM 500 MG/5ML IV SOLN
INTRAVENOUS | Status: AC
  Filled 2018-04-17: qty 5

## 2018-04-17 MED ORDER — LIDOCAINE 2% (20 MG/ML) 5 ML SYRINGE
INTRAMUSCULAR | Status: DC | PRN
  Administered 2018-04-17: 80 mg via INTRAVENOUS

## 2018-04-17 MED ORDER — LEVALBUTEROL HCL 0.63 MG/3ML IN NEBU
INHALATION_SOLUTION | RESPIRATORY_TRACT | Status: AC
  Filled 2018-04-17: qty 3

## 2018-04-17 MED ORDER — LEVALBUTEROL HCL 0.63 MG/3ML IN NEBU: 0.6300 mg | INHALATION_SOLUTION | Freq: Once | RESPIRATORY_TRACT | Status: DC

## 2018-04-17 MED ORDER — FENTANYL CITRATE (PF) 100 MCG/2ML IJ SOLN
INTRAMUSCULAR | Status: AC
  Filled 2018-04-17: qty 2

## 2018-04-17 MED ORDER — MIDAZOLAM HCL 5 MG/5ML IJ SOLN
INTRAMUSCULAR | Status: DC | PRN
  Administered 2018-04-17: 1 mg via INTRAVENOUS

## 2018-04-17 MED ORDER — FENTANYL CITRATE (PF) 100 MCG/2ML IJ SOLN
25.0000 ug | INTRAMUSCULAR | Status: DC | PRN
  Administered 2018-04-17: 50 ug via INTRAVENOUS

## 2018-04-17 MED ORDER — HYDROCODONE-ACETAMINOPHEN 5-325 MG PO TABS
1.0000 | ORAL_TABLET | ORAL | Status: DC | PRN
  Administered 2018-04-17 – 2018-04-18 (×2): 2 via ORAL
  Filled 2018-04-17 (×2): qty 2

## 2018-04-17 MED ORDER — PHENYLEPHRINE HCL 10 MG/ML IJ SOLN
INTRAMUSCULAR | Status: AC
  Filled 2018-04-17: qty 1

## 2018-04-17 MED ORDER — MIDAZOLAM HCL 2 MG/2ML IJ SOLN
INTRAMUSCULAR | Status: AC
  Filled 2018-04-17: qty 2

## 2018-04-17 MED ORDER — PROPOFOL 10 MG/ML IV BOLUS
INTRAVENOUS | Status: DC | PRN
  Administered 2018-04-17: 30 mg via INTRAVENOUS
  Administered 2018-04-17 (×3): 20 mg via INTRAVENOUS
  Administered 2018-04-17: 10 mg via INTRAVENOUS
  Administered 2018-04-17: 30 mg via INTRAVENOUS
  Administered 2018-04-17: 20 mg via INTRAVENOUS
  Administered 2018-04-17: 30 mg via INTRAVENOUS
  Administered 2018-04-17: 20 mg via INTRAVENOUS

## 2018-04-17 MED ORDER — SUCCINYLCHOLINE CHLORIDE 200 MG/10ML IV SOSY
PREFILLED_SYRINGE | INTRAVENOUS | Status: AC
  Filled 2018-04-17: qty 10

## 2018-04-17 MED ORDER — PHENYLEPHRINE HCL 10 MG/ML IJ SOLN
INTRAMUSCULAR | Status: DC | PRN
  Administered 2018-04-17: 80 ug via INTRAVENOUS

## 2018-04-17 MED ORDER — ETOMIDATE 2 MG/ML IV SOLN
INTRAVENOUS | Status: DC | PRN
  Administered 2018-04-17: 16 mg via INTRAVENOUS

## 2018-04-17 MED ORDER — SODIUM CHLORIDE 0.9 % IV SOLN
INTRAVENOUS | Status: DC | PRN
  Administered 2018-04-17: 19:00:00 via INTRAVENOUS

## 2018-04-17 MED ORDER — ROCURONIUM BROMIDE 10 MG/ML (PF) SYRINGE
PREFILLED_SYRINGE | INTRAVENOUS | Status: DC | PRN
  Administered 2018-04-17: 50 mg via INTRAVENOUS

## 2018-04-17 MED ORDER — ROCURONIUM BROMIDE 10 MG/ML (PF) SYRINGE
PREFILLED_SYRINGE | INTRAVENOUS | Status: AC
  Filled 2018-04-17: qty 10

## 2018-04-17 MED ORDER — BUPIVACAINE-EPINEPHRINE (PF) 0.25% -1:200000 IJ SOLN
INTRAMUSCULAR | Status: AC
  Filled 2018-04-17: qty 30

## 2018-04-17 MED ORDER — SODIUM CHLORIDE 0.9 % IR SOLN
Status: DC | PRN
  Administered 2018-04-17: 1000 mL

## 2018-04-17 MED ORDER — ONDANSETRON HCL 4 MG/2ML IJ SOLN
INTRAMUSCULAR | Status: AC
  Filled 2018-04-17: qty 2

## 2018-04-17 MED ORDER — ONDANSETRON HCL 4 MG/2ML IJ SOLN
INTRAMUSCULAR | Status: DC | PRN
  Administered 2018-04-17: 4 mg via INTRAVENOUS

## 2018-04-17 MED ORDER — SODIUM CHLORIDE 0.9 % IV SOLN
INTRAVENOUS | Status: DC | PRN
  Administered 2018-04-17: 13:00:00 via INTRAVENOUS

## 2018-04-17 MED ORDER — SODIUM CHLORIDE 0.9 % IR SOLN
Status: DC | PRN
  Administered 2018-04-17: 3000 mL

## 2018-04-17 MED ORDER — FENTANYL CITRATE (PF) 250 MCG/5ML IJ SOLN
INTRAMUSCULAR | Status: AC
  Filled 2018-04-17: qty 5

## 2018-04-17 MED ORDER — PROMETHAZINE HCL 25 MG/ML IJ SOLN: 6.2500 mg | INTRAMUSCULAR | Status: DC | PRN

## 2018-04-17 MED ORDER — DEXAMETHASONE SODIUM PHOSPHATE 10 MG/ML IJ SOLN
INTRAMUSCULAR | Status: AC
  Filled 2018-04-17: qty 1

## 2018-04-17 MED ORDER — LIDOCAINE 2% (20 MG/ML) 5 ML SYRINGE
INTRAMUSCULAR | Status: AC
  Filled 2018-04-17: qty 5

## 2018-04-17 SURGICAL SUPPLY — 41 items
BAG ZIPLOCK 12X15 (MISCELLANEOUS) ×3 IMPLANT
COVER SURGICAL LIGHT HANDLE (MISCELLANEOUS) ×3 IMPLANT
COVER WAND RF STERILE (DRAPES) IMPLANT
DRAPE INCISE IOBAN 66X45 STRL (DRAPES) ×3 IMPLANT
DRAPE ORTHO SPLIT 77X108 STRL (DRAPES) ×2
DRAPE SURG ORHT 6 SPLT 77X108 (DRAPES) ×2 IMPLANT
DRAPE U-SHAPE 47X51 STRL (DRAPES) ×7 IMPLANT
DRSG ADAPTIC 3X8 NADH LF (GAUZE/BANDAGES/DRESSINGS) ×3 IMPLANT
DRSG MEPILEX BORDER 4X4 (GAUZE/BANDAGES/DRESSINGS) ×3 IMPLANT
DRSG MEPILEX BORDER 4X8 (GAUZE/BANDAGES/DRESSINGS) ×3 IMPLANT
DURAPREP 26ML APPLICATOR (WOUND CARE) ×5 IMPLANT
ELECT REM PT RETURN 15FT ADLT (MISCELLANEOUS) ×3 IMPLANT
EVACUATOR 1/8 PVC DRAIN (DRAIN) ×3 IMPLANT
GAUZE SPONGE 2X2 8PLY STRL LF (GAUZE/BANDAGES/DRESSINGS) IMPLANT
GAUZE SPONGE 4X4 12PLY STRL (GAUZE/BANDAGES/DRESSINGS) ×3 IMPLANT
GLOVE BIO SURGEON STRL SZ7.5 (GLOVE) ×3 IMPLANT
GLOVE BIO SURGEON STRL SZ8 (GLOVE) ×6 IMPLANT
GLOVE BIOGEL PI IND STRL 7.0 (GLOVE) ×1 IMPLANT
GLOVE BIOGEL PI IND STRL 8 (GLOVE) ×3 IMPLANT
GLOVE BIOGEL PI INDICATOR 7.0 (GLOVE) ×2
GLOVE BIOGEL PI INDICATOR 8 (GLOVE) ×6
GLOVE SURG SS PI 7.0 STRL IVOR (GLOVE) ×3 IMPLANT
GOWN STRL REUS W/TWL LRG LVL3 (GOWN DISPOSABLE) ×6 IMPLANT
GOWN STRL REUS W/TWL XL LVL3 (GOWN DISPOSABLE) ×3 IMPLANT
HANDPIECE INTERPULSE COAX TIP (DISPOSABLE) ×2
KIT BASIN OR (CUSTOM PROCEDURE TRAY) ×3 IMPLANT
MANIFOLD NEPTUNE II (INSTRUMENTS) ×3 IMPLANT
PACK TOTAL JOINT (CUSTOM PROCEDURE TRAY) ×3 IMPLANT
PADDING CAST COTTON 6X4 STRL (CAST SUPPLIES) ×2 IMPLANT
PROTECTOR NERVE ULNAR (MISCELLANEOUS) ×3 IMPLANT
SET HNDPC FAN SPRY TIP SCT (DISPOSABLE) ×1 IMPLANT
SPONGE GAUZE 2X2 STER 10/PKG (GAUZE/BANDAGES/DRESSINGS) ×2
STAPLER VISISTAT 35W (STAPLE) ×1 IMPLANT
SUT MNCRL AB 4-0 PS2 18 (SUTURE) ×3 IMPLANT
SUT VIC AB 1 CT1 27 (SUTURE) ×4
SUT VIC AB 1 CT1 27XBRD ANTBC (SUTURE) ×3 IMPLANT
SUT VIC AB 2-0 CT1 27 (SUTURE) ×4
SUT VIC AB 2-0 CT1 TAPERPNT 27 (SUTURE) ×3 IMPLANT
SWAB COLLECTION DEVICE MRSA (MISCELLANEOUS) ×3 IMPLANT
SWAB CULTURE ESWAB REG 1ML (MISCELLANEOUS) ×3 IMPLANT
TOWEL OR 17X26 10 PK STRL BLUE (TOWEL DISPOSABLE) ×6 IMPLANT

## 2018-04-17 NOTE — Anesthesia Preprocedure Evaluation (Signed)
Anesthesia Evaluation  Patient identified by MRN, date of birth, ID band Patient awake    Reviewed: Allergy & Precautions, NPO status , Patient's Chart, lab work & pertinent test results  Airway Mallampati: II  TM Distance: >3 FB Neck ROM: Full    Dental no notable dental hx.    Pulmonary neg pulmonary ROS, former smoker,    Pulmonary exam normal breath sounds clear to auscultation       Cardiovascular hypertension, + Valvular Problems/Murmurs AS, AI and MR  Rhythm:Regular Rate:Normal + Systolic murmurs Please see echo section for full report. Normal LV size and wall thickness.  EF 55-60% with normal wall motion.  Normal right ventricular size and systolic function.  Moderate left atrial enlargement, no LA appendage thrombus.  Normal right atrium.  No PFO or ASD by color doppler.  Mild TR, peak RV-RA gradient 45 mmHg.  No TV vegetation.  There was moderate central mitral regurgitation.  ERO by PISA was only 0.14 cm^2 but visually 3+ MR.  There did not appear to be a mitral valve vegetation.  No systolic flow reversal in the pulmonary vein doppler pattern.  The aortic valve was trileaflet with a large vegetation that appeared to involve all leaflets.  There did appear to be be perforation.  There appeared to be severe, eccentric aortic insufficiency with holodiastolic flow reversal in the ascending thoracic aorta.  Mean gradient across the aortic valve was 32 mmHg, suggesting moderate aortic stenosis (elevated gradient was likely due to a combination of high flow from severe AI and leaflet impingement from bulky vegetation.   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  3+ mitral regurgitation without vegetation noted.    Neuro/Psych negative neurological ROS  negative psych ROS   GI/Hepatic negative GI ROS, Neg liver ROS,   Endo/Other  Morbid obesity  Renal/GU negative Renal ROS  negative genitourinary   Musculoskeletal negative musculoskeletal ROS (+)   Abdominal   Peds negative pediatric ROS (+)  Hematology  (+) anemia , thrombocytopenia   Anesthesia Other Findings   Reproductive/Obstetrics negative OB ROS                             Anesthesia Physical Anesthesia Plan  ASA: IV  Anesthesia Plan: General   Post-op Pain Management:    Induction: Intravenous  PONV Risk Score and Plan: 2 and Ondansetron and Treatment may vary due to age or medical condition  Airway Management Planned: Oral ETT  Additional Equipment:   Intra-op Plan:   Post-operative Plan: Extubation in OR  Informed Consent: I have reviewed the patients History and Physical, chart, labs and discussed the procedure including the risks, benefits and alternatives for the proposed anesthesia with the patient or authorized representative who has indicated his/her understanding and acceptance.   Dental advisory given  Plan Discussed with: CRNA and Surgeon  Anesthesia Plan Comments:         Anesthesia Quick Evaluation

## 2018-04-17 NOTE — Progress Notes (Addendum)
Progress Note  Patient Name: Elizabeth [name] Date of Encounter: 04/17/2018  Primary Cardiologist: Skeet Latch, MD   Subjective   Reports improvement breathing today. Pain is well controlled. No chest pain.   Inpatient Medications    Scheduled Meds: . aspirin  81 mg Oral Daily  . Chlorhexidine Gluconate Cloth  6 each Topical Daily  . enoxaparin (LOVENOX) injection  40 mg Subcutaneous Q24H  . feeding supplement (PRO-STAT SUGAR FREE 64)  30 mL Oral BID  . hydrocortisone sodium succinate  50 mg Intravenous Q6H  . mouth rinse  15 mL Mouth Rinse BID  . metoprolol tartrate  2.5 mg Intravenous Q6H  . midodrine  10 mg Oral TID WC  . multivitamin with minerals  1 tablet Oral Daily  . sodium chloride flush  10-40 mL Intracatheter Q12H   Continuous Infusions: . sodium chloride 20 mL/hr at 04/17/18 0600  . amiodarone 30 mg/hr (04/17/18 0626)  .  ceFAZolin (ANCEF) IV 2 g (04/17/18 0736)  . phenylephrine (NEO-SYNEPHRINE) Adult infusion Stopped (04/15/18 1755)   PRN Meds: acetaminophen, fentaNYL (SUBLIMAZE) injection, HYDROcodone-acetaminophen, ondansetron (ZOFRAN) IV, promethazine, sodium chloride flush   Vital Signs    Vitals:   04/17/18 0500 04/17/18 0600 04/17/18 0753 04/17/18 0756  BP: (!) 115/40 (!) 123/44 (!) 124/46   Pulse: (!) 55 (!) 55 (!) 55 (!) 54  Resp: (!) 22 (!) 24 (!) 22 20  Temp:      TempSrc:      SpO2: 97% 98% 97% 97%  Weight: 124.4 kg     Height:        Intake/Output Summary (Last 24 hours) at 04/17/2018 0818 Last data filed at 04/17/2018 0736 Gross per 24 hour  Intake 1005.49 ml  Output 1200 ml  Net -194.51 ml   Filed Weights   04/15/18 0455 04/16/18 0405 04/17/18 0500  Weight: 123.9 kg 125.6 kg 124.4 kg    Telemetry    Sinus rhythm/ sinus bradycardia at baseline with frequent ectopy and occasional bursts of SVT - Personally Reviewed  Physical Exam   GEN: Morbidly obese female laying in bed in no acute distress.   Neck: No JVD, no carotid  bruits Cardiac: RRR, +murmur, no rubs or gallops.  Respiratory: Clear to auscultation bilaterally anteriorly, no wheezes/ rales/ rhonchi GI: NABS, Soft, nontender, non-distended  MS: No edema; No deformity. Neuro:  Nonfocal, moving all extremities spontaneously Psych: Normal affect   Labs    Chemistry Recent Labs  Lab 04/11/18 2223  04/15/18 0500 04/16/18 0230 04/16/18 0403  NA 136   < > 137 137 137  K 4.2   < > 4.4 4.6 4.6  CL 105   < > 103 103 104  CO2 22   < > 27 27 27   GLUCOSE 149*   < > 145* 156* 150*  BUN 15   < > 29* 34* 32*  CREATININE 0.78   < > 0.90 0.93 0.93  CALCIUM 7.6*   < > 7.8* 8.1* 7.9*  PROT 5.7*  --   --   --   --   ALBUMIN 2.2*  --   --   --   --   AST 25  --   --   --   --   ALT 13  --   --   --   --   ALKPHOS 53  --   --   --   --   BILITOT 1.3*  --   --   --   --  GFRNONAA >60   < > >60 >60 >60  GFRAA >60   < > >60 >60 >60  ANIONGAP 9   < > 7 7 6    < > = values in this interval not displayed.     Hematology Recent Labs  Lab 04/14/18 0632 04/15/18 0500 04/16/18 0403  WBC 22.3* 22.5* 17.8*  RBC 2.92* 3.21* 3.07*  HGB 8.6* 9.5* 9.3*  HCT 28.2* 30.5* 30.0*  MCV 96.6 95.0 97.7  MCH 29.5 29.6 30.3  MCHC 30.5 31.1 31.0  RDW 15.1 15.2 15.2  PLT 84* 76* 64*    Cardiac Enzymes Recent Labs  Lab 04/12/18 1300 04/13/18 0107 04/16/18 2320 04/17/18 0503  TROPONINI 0.08* 0.07* 0.05* 0.05*   No results for input(s): TROPIPOC in the last 168 hours.   BNPNo results for input(s): BNP, PROBNP in the last 168 hours.   DDimer No results for input(s): DDIMER in the last 168 hours.   Radiology    Dg Chest Port 1 View  Result Date: 04/16/2018 CLINICAL DATA:  Shortness of breath. EXAM: PORTABLE CHEST 1 VIEW COMPARISON:  Chest x-ray dated April 10, 2018. FINDINGS: Interval placement of a right upper extremity PICC line with the tip at the cavoatrial junction. Stable cardiomediastinal silhouette. New mild pulmonary vascular congestion. Increasing  pleural fluid at the right costophrenic angle and along the right minor fissure. Possible trace left pleural effusion. Bibasilar atelectasis. Postsurgical changes in the right lung and hilum. No pneumothorax. No acute osseous abnormality. IMPRESSION: 1. New mild pulmonary vascular congestion with increasing small right and trace left pleural effusions. 2. Bibasilar atelectasis. Electronically Signed   By: Titus Dubin M.D.   On: 04/16/2018 22:57    Cardiac Studies   Echocardiogram 04/12/18: Study Conclusions  - Left ventricle: The cavity size was normal. Systolic function was   normal. The estimated ejection fraction was in the range of 60%   to 65%. Wall motion was normal; there were no regional wall   motion abnormalities. - Aortic valve: Trileaflet; severely thickened, severely calcified   leaflets. 1.5 x 2 cm calcified aortic mass noted (possible old   calcified vegetation). This is new when compared to prior   echocardiogram. Valve mobility was restricted. There was mild   regurgitation. - Mitral valve: There was moderate regurgitation. - Tricuspid valve: There was moderate regurgitation.  Patient Profile     67 y.o. female with past medical history of hypertension, hyperlipidemia, previous lung cancer status post lobectomyandmild aortic stenosis.Patient admitted December 8 with left hip pain, fever and hypotension. She is being treated for septic arthritis.Cardiology consulted for SVT.Also found to have an aortic mass noted on TTE (possible old calcified vegetation).  Assessment & Plan    1. Aortic mass in patient with bacteremia: noted on TTE this admission and felt to be 2/2 possible old calcified vegetation. Recommended for TEE to further evaluate. After careful review of history and examination, the risks and benefits of transesophageal echocardiogram have been explained including risks of esophageal damage, perforation (1:10,000 risk), bleeding, pharyngeal hematoma as  well as other potential complications associated with conscious sedation including aspiration, arrhythmia, respiratory failure and death. Alternatives to treatment were discussed, questions were answered. Patient is willing to proceed.  - TEE scheduled for 04/17/18 at 1:15pm with Dr. Aundra Dubin  2. Recurrent SVT: Seen by EP this admission and started on IV amiodarone for SVT/atrial tachycardia. Could be considered for EPS/Ablation in the future if rhythm difficult to control - Plan to switch to po amiodarone  400mg  BID tomorrow after completion of procedures.   3. Septic arthritis: patient presented with sepsis and found to have bacteremia and is on IV antibiotics. Felt to be 2/2 L hip infection after receiving a steroid injection. She is planned for debridement and wash-out in the OR today.  - Continue management per primary team and ortho.   For questions or updates, please contact [place] Please consult [email or web address] for contact info under Cardiology/STEMI.      Signed, Abigail Butts, PA-C  04/17/2018, 8:18 AM   [phone]   TEE findings reviewed with Dr. Aundra Dubin  "Findings: .Marland Kitchen Normal LV size and wall thickness.  EF 55-60% with normal wall motion.   Normal right ventricular size and systolic function.   Moderate left atrial enlargement, no LA appendage thrombus.  Normal right atrium.  No PFO or ASD by color doppler.   Mild TR, peak RV-RA gradient 45 mmHg.  No TV vegetation.   There was moderate central mitral regurgitation.  ERO by PISA was only 0.14 cm^2 but visually 3+ MR.  There did not appear to be a mitral valve vegetation.  No systolic flow reversal in the pulmonary vein doppler pattern.   The aortic valve was trileaflet with a large vegetation that appeared to involve all leaflets.  There did appear to be be perforation.  There appeared to be severe, eccentric aortic insufficiency with holodiastolic flow reversal in the ascending thoracic aorta.  Mean gradient across the  aortic valve was 32 mmHg, suggesting moderate aortic stenosis (elevated gradient was likely due to a combination of high flow from severe AI and leaflet impingement from bulky vegetation.   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  3+ mitral regurgitation without vegetation noted. "  She appears hemodynamically well compensated.  The very broad pulse pressure supports chronic aortic insufficiency, rather than acute insufficiency.   At this point the priority is to achieve bacteriological cure of endocarditis.  Once the bloodstream is clear and other foci of infection have also been dealt with (R hip), consider referral for surgical aortic valve replacement.  Will have to be preceded by cardiac catheterization. Continues to go in and out of atrial tachycardia.  Continue IV amiodarone another 24 hours.  Sanda Klein, MD, [facility] [phone] office [phone] pager 04/17/2018 5:28 PM

## 2018-04-17 NOTE — CV Procedure (Signed)
Procedure: TEE  Indication: Aortic valve endocarditis  Sedation: Per anesthesiology  Findings: Please see echo section for full report. Normal LV size and wall thickness.  EF 55-60% with normal wall motion.  Normal right ventricular size and systolic function.  Moderate left atrial enlargement, no LA appendage thrombus.  Normal right atrium.  No PFO or ASD by color doppler.  Mild TR, peak RV-RA gradient 45 mmHg.  No TV vegetation.  There was moderate central mitral regurgitation.  ERO by PISA was only 0.14 cm^2 but visually 3+ MR.  There did not appear to be a mitral valve vegetation.  No systolic flow reversal in the pulmonary vein doppler pattern.  The aortic valve was trileaflet with a large vegetation that appeared to involve all leaflets.  There did appear to be be perforation.  There appeared to be severe, eccentric aortic insufficiency with holodiastolic flow reversal in the ascending thoracic aorta.  Mean gradient across the aortic valve was 32 mmHg, suggesting moderate aortic stenosis (elevated gradient was likely due to a combination of high flow from severe AI and leaflet impingement from bulky vegetation.   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  3+ mitral regurgitation without vegetation noted.   Loralie Champagne 04/17/2018 1:52 PM

## 2018-04-17 NOTE — Transfer of Care (Signed)
Immediate Anesthesia Transfer of Care Note  Patient: Elizabeth [name]  Procedure(s) Performed: TRANSESOPHAGEAL ECHOCARDIOGRAM (TEE) (N/A )  Patient Location: Endoscopy Unit  Anesthesia Type:MAC  Level of Consciousness: awake, alert  and oriented  Airway & Oxygen Therapy: Patient Spontanous Breathing and Patient connected to nasal cannula oxygen  Post-op Assessment: Report given to RN, Post -op Vital signs reviewed and stable and Patient moving all extremities X 4  Post vital signs: Reviewed and stable  Last Vitals:  Vitals Value Taken Time  BP    Temp    Pulse    Resp    SpO2      Last Pain:  Vitals:   04/17/18 1159  TempSrc: Oral  PainSc: 0-No pain      Patients Stated Pain Goal: 2 (02/63/78 5885)  Complications: No apparent anesthesia complications

## 2018-04-17 NOTE — Progress Notes (Signed)
Subjective: Patient feels better today but is anxious about procedures.   Objective: Vital signs in last 24 hours: Temp:  [97.4 F (36.3 C)-98.3 F (36.8 C)] 97.9 F (36.6 C) (12/16 0400) Pulse Rate:  [30-69] 55 (12/16 0600) Resp:  [20-32] 24 (12/16 0600) BP: (91-135)/(40-91) 123/44 (12/16 0600) SpO2:  [95 %-100 %] 98 % (12/16 0600) Weight:  [124.4 kg] 124.4 kg (12/16 0500)  Intake/Output from previous day: 12/15 0701 - 12/16 0700 In: 1187.8 [I.V.:919.1; IV Piggyback:268.7] Out: 1600 [Urine:1600] Intake/Output this shift: No intake/output data recorded.  Recent Labs    04/15/18 0500 04/16/18 0403  HGB 9.5* 9.3*   Recent Labs    04/15/18 0500 04/16/18 0403  WBC 22.5* 17.8*  RBC 3.21* 3.07*  HCT 30.5* 30.0*  PLT 76* 64*   Recent Labs    04/16/18 0230 04/16/18 0403  NA 137 137  K 4.6 4.6  CL 103 104  CO2 27 27  BUN 34* 32*  CREATININE 0.93 0.93  GLUCOSE 156* 150*  CALCIUM 8.1* 7.9*   No results for input(s): LABPT, INR in the last 72 hours.  No cellulitis present Compartment soft Pain on attempted hip motion   Assessment/Plan: Septic arthritis left hip- Has plans for irrigation and debridement left hip and for TEE today. I won't be able to do the surgery until after 5 PM so hopefully the TEE could be done earlier at [facility]. If not then can do the  I & D on Wednesday   Elizabeth [name] [place] 04/17/2018, 7:06 AM

## 2018-04-17 NOTE — Progress Notes (Signed)
  Echocardiogram Echocardiogram Transesophageal has been performed.  Bobbye Charleston 04/17/2018, 2:00 PM

## 2018-04-17 NOTE — Progress Notes (Signed)
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    Physical Exam: Constitutional:  Vitals:   04/17/18 1548 04/17/18 1600  BP:  116/65  Pulse:  (!) 55  Resp:  19  Temp: (!) 97.5 F (36.4 C)   SpO2:  97%   patient appears in NAD Eyes: anicteric HENT: no thrush Respiratory: Normal respiratory effort; CTA B Cardiovascular: RRR GI: soft, nt, nd  Review of Systems: Constitutional: negative for fevers and chills Gastrointestinal: negative for nausea and diarrhea Integument/breast: negative for rash  Lab Results  Component Value Date   WBC 17.8 (H) 04/16/2018   HGB 9.3 (L) 04/16/2018   HCT 30.0 (L) 04/16/2018   MCV 97.7 04/16/2018   PLT 64 (L) 04/16/2018    Lab Results  Component Value Date   CREATININE 0.93 04/16/2018   BUN 32 (H) 04/16/2018   NA 137 04/16/2018   K 4.6 04/16/2018   CL 104 04/16/2018   CO2 27 04/16/2018    Lab Results  Component Value Date   ALT 13 04/11/2018   AST 25 04/11/2018   ALKPHOS 53 04/11/2018     Microbiology: Recent Results (from the past 240 hour(s))  Culture, blood (routine x 2)     Status: Abnormal   Collection Time: 04/09/18  4:01 PM  Result Value Ref Range Status   Specimen Description   Final    BLOOD RIGHT ANTECUBITAL Performed at [facility], [place] [address] [place], [place] [address]    Special Requests   Final    BOTTLES DRAWN AEROBIC AND ANAEROBIC Blood Culture adequate volume Performed at [facility] [address] [place], [place] [address]    Culture  Setup Time   Final    GRAM POSITIVE COCCI IN CLUSTERS IN BOTH AEROBIC AND ANAEROBIC BOTTLES CRITICAL RESULT CALLED TO, READ BACK BY AND VERIFIED WITH: PHAM ANH PHARMD AT 1000 ON [id number]  BY SJW    Culture (A)  Final    STAPHYLOCOCCUS  LUGDUNENSIS SUSCEPTIBILITIES PERFORMED ON PREVIOUS CULTURE WITHIN THE LAST 5 DAYS. Performed at [facility], [place] [address] [place], [place] [address]    Report Status 04/13/2018 FINAL  Final  Blood Culture ID Panel (Reflexed)     Status: Abnormal   Collection Time: 04/09/18  4:01 PM  Result Value Ref Range Status   Enterococcus species NOT DETECTED NOT DETECTED Final   Listeria monocytogenes NOT DETECTED NOT DETECTED Final   Staphylococcus species DETECTED (A) NOT DETECTED Final    Comment: Methicillin (oxacillin) susceptible coagulase negative staphylococcus. Possible blood culture contaminant (unless isolated from more than one blood culture draw or clinical case suggests pathogenicity). No antibiotic treatment is indicated for blood  culture contaminants. CRITICAL RESULT CALLED TO, READ BACK BY AND VERIFIED WITH: PHAM ANH PHARMD AT 1000 ON [id number] BY SJW    Staphylococcus aureus (BCID) NOT DETECTED NOT DETECTED Final   Methicillin resistance NOT DETECTED NOT DETECTED Final   Streptococcus species NOT DETECTED NOT DETECTED Final   Streptococcus agalactiae NOT DETECTED NOT DETECTED Final   Streptococcus pneumoniae NOT DETECTED NOT DETECTED Final   Streptococcus pyogenes NOT DETECTED NOT DETECTED Final   Acinetobacter baumannii NOT DETECTED NOT DETECTED Final   Enterobacteriaceae species NOT DETECTED NOT DETECTED Final   Enterobacter cloacae complex NOT DETECTED NOT DETECTED Final  Escherichia coli NOT DETECTED NOT DETECTED Final   Klebsiella oxytoca NOT DETECTED NOT DETECTED Final   Klebsiella pneumoniae NOT DETECTED NOT DETECTED Final   Proteus species NOT DETECTED NOT DETECTED Final   Serratia marcescens NOT DETECTED NOT DETECTED Final   Haemophilus influenzae NOT DETECTED NOT DETECTED Final   Neisseria meningitidis NOT DETECTED NOT DETECTED Final   Pseudomonas aeruginosa NOT DETECTED NOT DETECTED Final   Candida albicans NOT DETECTED NOT DETECTED Final   Candida  glabrata NOT DETECTED NOT DETECTED Final   Candida krusei NOT DETECTED NOT DETECTED Final   Candida parapsilosis NOT DETECTED NOT DETECTED Final   Candida tropicalis NOT DETECTED NOT DETECTED Final    Comment: Performed at [facility], [place] [address], [place], [place] [address]  Culture, blood (routine x 2)     Status: Abnormal   Collection Time: 04/09/18  4:06 PM  Result Value Ref Range Status   Specimen Description   Final    BLOOD LEFT ANTECUBITAL Performed at [facility] [address], [place], [place] [address]    Special Requests   Final    BOTTLES DRAWN AEROBIC AND ANAEROBIC Blood Culture adequate volume Performed at [facility] [address] [place], [place] [address]    Culture  Setup Time   Final    GRAM POSITIVE COCCI IN CLUSTERS IN BOTH AEROBIC AND ANAEROBIC BOTTLES CRITICAL VALUE NOTED.  VALUE IS CONSISTENT WITH PREVIOUSLY REPORTED AND CALLED VALUE. Performed at [facility], [place] [address] [place], [place] [address]    Culture STAPHYLOCOCCUS LUGDUNENSIS (A)  Final   Report Status 04/13/2018 FINAL  Final   Organism ID, Bacteria STAPHYLOCOCCUS LUGDUNENSIS  Final      Susceptibility   Staphylococcus lugdunensis - MIC*    CIPROFLOXACIN <=0.5 SENSITIVE Sensitive     ERYTHROMYCIN >=8 RESISTANT Resistant     GENTAMICIN <=0.5 SENSITIVE Sensitive     OXACILLIN 0.5 SENSITIVE Sensitive     TETRACYCLINE <=1 SENSITIVE Sensitive     VANCOMYCIN <=0.5 SENSITIVE Sensitive     TRIMETH/SULFA <=10 SENSITIVE Sensitive     CLINDAMYCIN RESISTANT Resistant     RIFAMPIN <=0.5 SENSITIVE Sensitive     Inducible Clindamycin POSITIVE Resistant     * STAPHYLOCOCCUS LUGDUNENSIS  MRSA PCR Screening     Status: None   Collection Time: 04/09/18 10:41 PM  Result Value Ref Range Status   MRSA by PCR NEGATIVE NEGATIVE Final    Comment:        The GeneXpert MRSA Assay (FDA approved for NASAL specimens only), is one component of  a comprehensive MRSA colonization surveillance program. It is not intended to diagnose MRSA infection nor to guide or monitor treatment for MRSA infections. Performed at [facility], [place] [address] [place], [place] [address]   Body fluid culture     Status: None   Collection Time: 04/10/18  5:15 PM  Result Value Ref Range Status   Specimen Description   Final    SYNOVIAL Performed at [facility] [address] [place], [place] [address]    Special Requests   Final    HIP Performed at [facility], [place] [address] [place], [place] [address]    Gram Stain   Final    RARE WBC PRESENT,BOTH PMN AND MONONUCLEAR NO ORGANISMS SEEN    Culture   Final    RARE STAPHYLOCOCCUS LUGDUNENSIS CRITICAL RESULT CALLED TO, READ BACK BY AND VERIFIED  WITH: RN Jerre Simon [phone] AT 15 AM BY CN Performed at [facility], [place] [address] [place], [place] [address]    Report Status 04/14/2018 FINAL  Final   Organism ID, Bacteria STAPHYLOCOCCUS LUGDUNENSIS  Final      Susceptibility   Staphylococcus lugdunensis - MIC*    CIPROFLOXACIN <=0.5 SENSITIVE Sensitive     ERYTHROMYCIN >=8 RESISTANT Resistant     GENTAMICIN <=0.5 SENSITIVE Sensitive     OXACILLIN 0.5 SENSITIVE Sensitive     TETRACYCLINE <=1 SENSITIVE Sensitive     VANCOMYCIN <=0.5 SENSITIVE Sensitive     TRIMETH/SULFA <=10 SENSITIVE Sensitive     CLINDAMYCIN RESISTANT Resistant     RIFAMPIN <=0.5 SENSITIVE Sensitive     Inducible Clindamycin POSITIVE Resistant     * RARE STAPHYLOCOCCUS LUGDUNENSIS  Culture, blood (Routine X 2) w Reflex to ID Panel     Status: None (Preliminary result)   Collection Time: 04/11/18  4:38 PM  Result Value Ref Range Status   Specimen Description   Final    BLOOD LEFT HAND Performed at [facility] [address], [place], [place] [address]    Special Requests   Final    BOTTLES DRAWN AEROBIC AND ANAEROBIC Blood Culture  adequate volume Performed at [facility] [address], [place], [place] [address]    Culture   Final    NO GROWTH 4 DAYS Performed at [facility], [place] [address] [place], [place] [address]    Report Status PENDING  Incomplete  Culture, blood (Routine X 2) w Reflex to ID Panel     Status: None (Preliminary result)   Collection Time: 04/11/18  4:45 PM  Result Value Ref Range Status   Specimen Description   Final    BLOOD RIGHT HAND Performed at [facility] [address], [place], [place] [address]    Special Requests   Final    BOTTLES DRAWN AEROBIC AND ANAEROBIC Blood Culture adequate volume Performed at [facility] [address] [place], [place] [address]    Culture   Final    NO GROWTH 4 DAYS Performed at [facility], [place] [address] [place], [place] [address]    Report Status PENDING  Incomplete    Impression/Plan:  1. Staph lugdunensis - oxicillin sensitive.  On cefazolin.  Repeat cultures ngtd.  2. AV endocarditis - severe AI.  Treatment as above.  Needs CVTS consultation (likely transfer to [facility]).  Discussed with patient and daughter at bedside.    3.  Hip infection - needs debridement and scheduled for today, likely 2 stage surgery due to #2.

## 2018-04-17 NOTE — Anesthesia Procedure Notes (Signed)
Procedure Name: MAC Date/Time: 04/17/2018 1:25 PM Performed by: Neldon Newport, CRNA Pre-anesthesia Checklist: Timeout performed, Patient being monitored, Emergency Drugs available, Patient identified and Suction available Patient Re-evaluated:Patient Re-evaluated prior to induction Oxygen Delivery Method: Nasal cannula Placement Confirmation: positive ETCO2 Dental Injury: Teeth and Oropharynx as per pre-operative assessment

## 2018-04-17 NOTE — Progress Notes (Addendum)
PROGRESS NOTE  Elizabeth [name] QBV:[id number] DOB: 01-28-1951 DOA: 04/09/2018 PCP: Ann Held, DO  Brief History:  67 y/o female with osteoarthritis of the left hip who underwent an injection of the same on 11/11 (per IR) developed left hip pain on 11/14.  She presented to [facility] in septic shock with worsening left hip pain on 12/9 and was admitted to [facility].  Blood cultures grew staph lugdunesis in 4/4 bottles.     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 3+ mitral regurgitation without vegetation noted. -off pressor, critical care signed off. Transferred to hospitalist service on 12/16, she remained on stress dose steroids/midodrine for bp support. -ID/ortho/cardiology on border - repeat blood culture no growth -TEE today  - IRRIGATION AND DEBRIDEMENT LEFT HIP (ANTERIOR) (Left) today By Dr Wynelle Link -will likely need to transfer to [facility] for cardiac cath and  CT surgery, to be determined by cardiology  Thrombocytopenia:  Likely from infection Monitor, no sign of acute bleed  Recurrent SVT/atrial tachycardia - on amiodarone drip, cardiology following  Volume overload: Prn lasix if bp and cr allows  Anemia on chronic disease: hgb baseline around 9  HTN:  admitted with septic shock  Home bp meds benicar HCTZ  held   lung cancer resected 2010  Morbid obesity: Body mass index is 47.08 kg/m.   Code Status: full  Family Communication: patient and daughter  Disposition Plan: remain in stepdown   Consultants:  Admitted to critical care on 12/8, transferred  to hospitalist service on 12/16  ID  Cardiology  ortho  Procedures:  TEE on 12/15  Left hip washout on 12/15  Antibiotics:  Per ID   Objective: BP (!) 96/39   Pulse (!) 57   Temp (!) 97.5 F (36.4 C) (Oral)   Resp (!) 22   Ht 5\' 4"  (1.626 m)   Wt 124.4 kg   SpO2 96%   BMI 47.08 kg/m   Intake/Output Summary (Last 24 hours) at 04/17/2018 1620 Last data filed at 04/17/2018 1349 Gross per 24 hour  Intake 1066.4 ml  Output 1200 ml  Net -133.6 ml   Filed Weights   04/16/18 0405 04/17/18 0500 04/17/18 1159  Weight: 125.6 kg 124.4 kg 124.4 kg    Exam: Patient is examined daily including today on 04/17/2018, exams remain the same as of yesterday except that has changed    General:  Weak, frail, NAD  Cardiovascular: RRR  Respiratory: diminished at basis, no wheezing, no rales, no rhonchi  Abdomen: Soft/ND/NT, positive BS  Musculoskeletal: bilateral lower extremity pitting Edema  Neuro: alert and interactive  Data Reviewed: Basic Metabolic Panel: Recent Labs  Lab 04/12/18 1228 04/13/18 0107 04/14/18 0632 04/15/18 0500 04/16/18 0230 04/16/18 0403  NA  --  137 139 137 137 137  K  --  4.2 4.0 4.4 4.6 4.6  CL  --  106 108 103 103 104  CO2  --  22 25 27 27 27   GLUCOSE  --  178* 149* 145* 156* 150*  BUN  --  22 25* 29* 34* 32*  CREATININE  --  0.81 0.77 0.90 0.93 0.93  CALCIUM  --  8.0* 7.2* 7.8* 8.1* 7.9*  MG 1.8 1.9  --   --   --   --    Liver Function Tests: Recent Labs  Lab 04/11/18 2223  AST 25  ALT 13  ALKPHOS 53  BILITOT 1.3*  PROT 5.7*  ALBUMIN 2.2*   No results for input(s): LIPASE, AMYLASE in the last 168 hours. No results for input(s): AMMONIA in the last 168 hours. CBC: Recent Labs  Lab 04/11/18 0313  04/13/18 0107 04/14/18 0500 04/14/18 0632 04/15/18 0500 04/16/18 0403  WBC 17.1*   < > 27.7* QUESTIONABLE RESULTS, RECOMMEND RECOLLECT TO VERIFY 22.3* 22.5* 17.8*  NEUTROABS 13.8*  --   --  QUESTIONABLE RESULTS, RECOMMEND  RECOLLECT TO VERIFY 19.2* 18.6* 14.6*  HGB 8.6*   < > 10.1* QUESTIONABLE RESULTS, RECOMMEND RECOLLECT TO VERIFY 8.6* 9.5* 9.3*  HCT 28.1*   < > 32.1* QUESTIONABLE RESULTS, RECOMMEND RECOLLECT TO VERIFY 28.2* 30.5* 30.0*  MCV 95.3   < > 95.0 QUESTIONABLE RESULTS, RECOMMEND RECOLLECT TO VERIFY 96.6 95.0 97.7  PLT 65*   < > 87* QUESTIONABLE RESULTS, RECOMMEND RECOLLECT TO VERIFY 84* 76* 64*   < > = values in this interval not displayed.   Cardiac Enzymes:   Recent Labs  Lab 04/12/18 0706 04/12/18 1300 04/13/18 0107 04/16/18 2320 04/17/18 0503  TROPONINI 0.09* 0.08* 0.07* 0.05* 0.05*   BNP (last 3 results) No results for input(s): BNP in the last 8760 hours.  ProBNP (last 3 results) No results for input(s): PROBNP in the last 8760 hours.  CBG: Recent Labs  Lab 04/12/18 1201  GLUCAP 188*    Recent Results (from the past 240 hour(s))  Culture, blood (routine x 2)     Status: Abnormal   Collection Time: 04/09/18  4:01 PM  Result Value Ref Range Status   Specimen Description   Final    BLOOD RIGHT ANTECUBITAL Performed at [facility], [place] [address], [place], [place] [address]    Special Requests   Final    BOTTLES DRAWN AEROBIC AND ANAEROBIC Blood Culture adequate volume Performed at [facility] [address] [place], [place] [address]    Culture  Setup Time   Final    GRAM POSITIVE COCCI IN CLUSTERS IN BOTH AEROBIC AND ANAEROBIC BOTTLES CRITICAL RESULT CALLED TO, READ BACK BY AND VERIFIED WITH: PHAM ANH PHARMD AT 1000 ON [id number]  BY SJW    Culture (A)  Final    STAPHYLOCOCCUS LUGDUNENSIS SUSCEPTIBILITIES PERFORMED ON PREVIOUS CULTURE WITHIN THE LAST 5 DAYS. Performed at [facility], [place] [address], [place], [place] [address]    Report Status 04/13/2018 FINAL  Final  Blood Culture ID Panel (Reflexed)     Status: Abnormal   Collection Time: 04/09/18  4:01 PM  Result Value Ref Range Status   Enterococcus species NOT DETECTED NOT  DETECTED Final   Listeria monocytogenes NOT DETECTED NOT DETECTED Final   Staphylococcus species DETECTED (A) NOT DETECTED Final    Comment: Methicillin (oxacillin) susceptible coagulase negative staphylococcus. Possible blood culture contaminant (unless isolated from more than one blood culture draw or clinical case suggests pathogenicity). No antibiotic treatment is indicated for blood  culture contaminants. CRITICAL RESULT CALLED TO, READ BACK BY AND VERIFIED WITH: PHAM ANH PHARMD AT 1000 ON [id number] BY SJW    Staphylococcus aureus (BCID) NOT DETECTED NOT DETECTED Final   Methicillin resistance NOT DETECTED NOT DETECTED Final  Streptococcus species NOT DETECTED NOT DETECTED Final   Streptococcus agalactiae NOT DETECTED NOT DETECTED Final   Streptococcus pneumoniae NOT DETECTED NOT DETECTED Final   Streptococcus pyogenes NOT DETECTED NOT DETECTED Final   Acinetobacter baumannii NOT DETECTED NOT DETECTED Final   Enterobacteriaceae species NOT DETECTED NOT DETECTED Final   Enterobacter cloacae complex NOT DETECTED NOT DETECTED Final   Escherichia coli NOT DETECTED NOT DETECTED Final   Klebsiella oxytoca NOT DETECTED NOT DETECTED Final   Klebsiella pneumoniae NOT DETECTED NOT DETECTED Final   Proteus species NOT DETECTED NOT DETECTED Final   Serratia marcescens NOT DETECTED NOT DETECTED Final   Haemophilus influenzae NOT DETECTED NOT DETECTED Final   Neisseria meningitidis NOT DETECTED NOT DETECTED Final   Pseudomonas aeruginosa NOT DETECTED NOT DETECTED Final   Candida albicans NOT DETECTED NOT DETECTED Final   Candida glabrata NOT DETECTED NOT DETECTED Final   Candida krusei NOT DETECTED NOT DETECTED Final   Candida parapsilosis NOT DETECTED NOT DETECTED Final   Candida tropicalis NOT DETECTED NOT DETECTED Final    Comment: Performed at [facility], [place] [address], [place], [place] [address]  Culture, blood (routine x 2)     Status: Abnormal   Collection Time: 04/09/18  4:06 PM   Result Value Ref Range Status   Specimen Description   Final    BLOOD LEFT ANTECUBITAL Performed at [facility] [address] [place], [place] [address]    Special Requests   Final    BOTTLES DRAWN AEROBIC AND ANAEROBIC Blood Culture adequate volume Performed at [facility] [address], [place], [place] [address]    Culture  Setup Time   Final    GRAM POSITIVE COCCI IN CLUSTERS IN BOTH AEROBIC AND ANAEROBIC BOTTLES CRITICAL VALUE NOTED.  VALUE IS CONSISTENT WITH PREVIOUSLY REPORTED AND CALLED VALUE. Performed at [facility], [place] [address], [place], [place] [address]    Culture STAPHYLOCOCCUS LUGDUNENSIS (A)  Final   Report Status 04/13/2018 FINAL  Final   Organism ID, Bacteria STAPHYLOCOCCUS LUGDUNENSIS  Final      Susceptibility   Staphylococcus lugdunensis - MIC*    CIPROFLOXACIN <=0.5 SENSITIVE Sensitive     ERYTHROMYCIN >=8 RESISTANT Resistant     GENTAMICIN <=0.5 SENSITIVE Sensitive     OXACILLIN 0.5 SENSITIVE Sensitive     TETRACYCLINE <=1 SENSITIVE Sensitive     VANCOMYCIN <=0.5 SENSITIVE Sensitive     TRIMETH/SULFA <=10 SENSITIVE Sensitive     CLINDAMYCIN RESISTANT Resistant     RIFAMPIN <=0.5 SENSITIVE Sensitive     Inducible Clindamycin POSITIVE Resistant     * STAPHYLOCOCCUS LUGDUNENSIS  MRSA PCR Screening     Status: None   Collection Time: 04/09/18 10:41 PM  Result Value Ref Range Status   MRSA by PCR NEGATIVE NEGATIVE Final    Comment:        The GeneXpert MRSA Assay (FDA approved for NASAL specimens only), is one component of a comprehensive MRSA colonization surveillance program. It is not intended to diagnose MRSA infection nor to guide or monitor treatment for MRSA infections. Performed at [facility], [place] [address], [place], [place] [address]   Body fluid culture     Status: None   Collection Time: 04/10/18  5:15 PM  Result Value Ref Range Status   Specimen Description    Final    SYNOVIAL Performed at [facility] [address], [place],  [address]  Special Requests   Final    HIP Performed at [facility], [place] [address] [place], [place] [address]    Gram Stain   Final    RARE WBC PRESENT,BOTH PMN AND MONONUCLEAR NO ORGANISMS SEEN    Culture   Final    RARE STAPHYLOCOCCUS LUGDUNENSIS CRITICAL RESULT CALLED TO, READ BACK BY AND VERIFIED WITH: RN Jerre Simon [phone] AT 25 AM BY CN Performed at [facility], [place] [address], [place], [place] [address]    Report Status 04/14/2018 FINAL  Final   Organism ID, Bacteria STAPHYLOCOCCUS LUGDUNENSIS  Final      Susceptibility   Staphylococcus lugdunensis - MIC*    CIPROFLOXACIN <=0.5 SENSITIVE Sensitive     ERYTHROMYCIN >=8 RESISTANT Resistant     GENTAMICIN <=0.5 SENSITIVE Sensitive     OXACILLIN 0.5 SENSITIVE Sensitive     TETRACYCLINE <=1 SENSITIVE Sensitive     VANCOMYCIN <=0.5 SENSITIVE Sensitive     TRIMETH/SULFA <=10 SENSITIVE Sensitive     CLINDAMYCIN RESISTANT Resistant     RIFAMPIN <=0.5 SENSITIVE Sensitive     Inducible Clindamycin POSITIVE Resistant     * RARE STAPHYLOCOCCUS LUGDUNENSIS  Culture, blood (Routine X 2) w Reflex to ID Panel     Status: None (Preliminary result)   Collection Time: 04/11/18  4:38 PM  Result Value Ref Range Status   Specimen Description   Final    BLOOD LEFT HAND Performed at [facility] [address], [place], [place] [address]    Special Requests   Final    BOTTLES DRAWN AEROBIC AND ANAEROBIC Blood Culture adequate volume Performed at [facility] [address], [place], [place] [address]    Culture   Final    NO GROWTH 4 DAYS Performed at [facility], [place] [address], [place], [place] [address]    Report Status PENDING  Incomplete  Culture, blood (Routine X 2) w Reflex to ID Panel     Status: None (Preliminary result)   Collection Time: 04/11/18  4:45 PM    Result Value Ref Range Status   Specimen Description   Final    BLOOD RIGHT HAND Performed at [facility] [address], [place], [place] [address]    Special Requests   Final    BOTTLES DRAWN AEROBIC AND ANAEROBIC Blood Culture adequate volume Performed at [facility] [address], [place], [place] [address]    Culture   Final    NO GROWTH 4 DAYS Performed at [facility], [place] [address] [place], [place] [address]    Report Status PENDING  Incomplete     Studies: Dg Chest Port 1 View  Result Date: 04/16/2018 CLINICAL DATA:  Shortness of breath. EXAM: PORTABLE CHEST 1 VIEW COMPARISON:  Chest x-ray dated April 10, 2018. FINDINGS: Interval placement of a right upper extremity PICC line with the tip at the cavoatrial junction. Stable cardiomediastinal silhouette. New mild pulmonary vascular congestion. Increasing pleural fluid at the right costophrenic angle and along the right minor fissure. Possible trace left pleural effusion. Bibasilar atelectasis. Postsurgical changes in the right lung and hilum. No pneumothorax. No acute osseous abnormality. IMPRESSION: 1. New mild pulmonary vascular congestion with increasing small right and trace left pleural effusions. 2. Bibasilar atelectasis. Electronically Signed   By: Titus Dubin M.D.   On: 04/16/2018 22:57    Scheduled Meds: . aspirin  81 mg Oral Daily  . Chlorhexidine  Gluconate Cloth  6 each Topical Daily  . enoxaparin (LOVENOX) injection  40 mg Subcutaneous Q24H  . feeding supplement (PRO-STAT SUGAR FREE 64)  30 mL Oral BID  . hydrocortisone sodium succinate  50 mg Intravenous Q6H  . mouth rinse  15 mL Mouth Rinse BID  . metoprolol tartrate  2.5 mg Intravenous Q6H  . midodrine  10 mg Oral TID WC  . multivitamin with minerals  1 tablet Oral Daily  . sodium chloride flush  10-40 mL Intracatheter Q12H    Continuous Infusions: . amiodarone 30 mg/hr (04/17/18 0626)  .  ceFAZolin  (ANCEF) IV Stopped (04/17/18 0806)  . phenylephrine (NEO-SYNEPHRINE) Adult infusion Stopped (04/15/18 1755)     Time spent: 23mins I have personally reviewed and interpreted on  04/17/2018 daily labs, tele strips, imagings as discussed above under date review session and assessment and plans.  I reviewed all nursing notes, pharmacy notes, consultant notes,  vitals, pertinent old records  I have discussed plan of care as described above with RN , patient and family on 04/17/2018   Florencia Reasons MD, PhD  [facility] Pager [phone]. If 7PM-7AM, please contact night-coverage at [email or web address], password [facility] 04/17/2018, 4:20 PM  LOS: 8 days

## 2018-04-17 NOTE — Brief Op Note (Signed)
04/09/2018 - 04/17/2018  8:13 PM  PATIENT:  Elizabeth [name]  67 y.o. female  PRE-OPERATIVE DIAGNOSIS:  SEPTIC ARTHRITIS LEFT HIP  POST-OPERATIVE DIAGNOSIS:  SEPTIC ARTHRITIS LEFT HIP  PROCEDURE:  Procedure(s): IRRIGATION AND DEBRIDEMENT LEFT HIP (ANTERIOR) (Left)  SURGEON:  Surgeon(s) and Role:    Gaynelle Arabian, MD - Primary  PHYSICIAN ASSISTANT:   ASSISTANTS: Ardeen Jourdain, PA-C   ANESTHESIA:   general  EBL: 10 ml  BLOOD ADMINISTERED:none  DRAINS: (Medium) Hemovact drain(s) in the left hip with  Suction Open   LOCAL MEDICATIONS USED:  NONE  SPECIMEN:  No Specimen    TOURNIQUET:  * No tourniquets in log *  DICTATION: .Other Dictation: Dictation Number [id number]  PLAN OF CARE: Admit to inpatient   PATIENT DISPOSITION:  PACU - hemodynamically stable.

## 2018-04-17 NOTE — Anesthesia Postprocedure Evaluation (Signed)
Anesthesia Post Note  Patient: Elizabeth [name]  Procedure(s) Performed: TRANSESOPHAGEAL ECHOCARDIOGRAM (TEE) (N/A )     Patient location during evaluation: PACU Anesthesia Type: MAC Level of consciousness: awake and alert Pain management: pain level controlled Vital Signs Assessment: post-procedure vital signs reviewed and stable Respiratory status: spontaneous breathing, nonlabored ventilation, respiratory function stable and patient connected to nasal cannula oxygen Cardiovascular status: stable and blood pressure returned to baseline Postop Assessment: no apparent nausea or vomiting Anesthetic complications: no    Last Vitals:  Vitals:   04/17/18 1100 04/17/18 1159  BP:  (!) 143/59  Pulse: (!) 55   Resp: (!) 23 (!) 22  Temp:  36.5 C  SpO2: 97% 97%    Last Pain:  Vitals:   04/17/18 1159  TempSrc: Oral  PainSc: 0-No pain                 Sanaiyah Kirchhoff

## 2018-04-17 NOTE — Anesthesia Procedure Notes (Signed)
Procedure Name: Intubation Date/Time: 04/17/2018 7:25 PM Performed by: Jyssica Rief D, CRNA Pre-anesthesia Checklist: Patient identified, Emergency Drugs available, Suction available and Patient being monitored Patient Re-evaluated:Patient Re-evaluated prior to induction Oxygen Delivery Method: Circle system utilized Preoxygenation: Pre-oxygenation with 100% oxygen Induction Type: IV induction Ventilation: Mask ventilation without difficulty Laryngoscope Size: Mac and 4 Grade View: Grade I Tube type: Oral Tube size: 7.5 mm Number of attempts: 1 Airway Equipment and Method: Stylet Placement Confirmation: ETT inserted through vocal cords under direct vision,  positive ETCO2 and breath sounds checked- equal and bilateral Secured at: 21 cm Tube secured with: Tape Dental Injury: Teeth and Oropharynx as per pre-operative assessment

## 2018-04-17 NOTE — Interval H&P Note (Signed)
History and Physical Interval Note:  04/17/2018 1:31 PM  Elizabeth [name]  has presented today for surgery, with the diagnosis of aortic mass  The various methods of treatment have been discussed with the patient and family. After consideration of risks, benefits and other options for treatment, the patient has consented to  Procedure(s): TRANSESOPHAGEAL ECHOCARDIOGRAM (TEE) (N/A) as a surgical intervention .  The patient's history has been reviewed, patient examined, no change in status, stable for surgery.  I have reviewed the patient's chart and labs.  Questions were answered to the patient's satisfaction.     Sundee Garland [employer]

## 2018-04-17 NOTE — Transfer of Care (Signed)
Immediate Anesthesia Transfer of Care Note  Patient: Elizabeth [name]  Procedure(s) Performed: IRRIGATION AND DEBRIDEMENT LEFT HIP (ANTERIOR) (Left Hip)  Patient Location: PACU  Anesthesia Type:General  Level of Consciousness: awake, alert  and oriented  Airway & Oxygen Therapy: Patient Spontanous Breathing and Patient connected to face mask oxygen  Post-op Assessment: Report given to RN and Post -op Vital signs reviewed and stable  Post vital signs: Reviewed and stable  Last Vitals:  Vitals Value Taken Time  BP 131/60 04/17/2018  8:35 PM  Temp    Pulse 73 04/17/2018  8:37 PM  Resp 23 04/17/2018  8:37 PM  SpO2 96 % 04/17/2018  8:37 PM  Vitals shown include unvalidated device data.  Last Pain:  Vitals:   04/17/18 1813  TempSrc:   PainSc: 3       Patients Stated Pain Goal: 2 (25/67/20 9198)  Complications: No apparent anesthesia complications

## 2018-04-17 NOTE — Anesthesia Preprocedure Evaluation (Signed)
Anesthesia Evaluation  Patient identified by MRN, date of birth, ID band Patient awake    Reviewed: Allergy & Precautions, NPO status , Patient's Chart, lab work & pertinent test results  History of Anesthesia Complications Negative for: history of anesthetic complications  Airway Mallampati: III  TM Distance: >3 FB Neck ROM: Full    Dental  (+) Poor Dentition   Pulmonary former smoker,   Hx lung cancer s/p partial lobectomy     breath sounds clear to auscultation       Cardiovascular hypertension, Pt. on medications + Valvular Problems/Murmurs AS  Rhythm:Regular Rate:Normal   '18 TTE - EF 60-65%, mild AS, mild MR    Neuro/Psych Anxiety negative neurological ROS     GI/Hepatic negative GI ROS, Neg liver ROS,   Endo/Other  Morbid obesity Obesity   Renal/GU negative Renal ROS  negative genitourinary   Musculoskeletal  (+) Arthritis ,   Abdominal (+) + obese,   Peds  Hematology negative hematology ROS (+)   Anesthesia Other Findings   Reproductive/Obstetrics                             Anesthesia Physical  Anesthesia Plan  ASA: III  Anesthesia Plan: MAC   Post-op Pain Management:    Induction:   PONV Risk Score and Plan: 2 and Treatment may vary due to age or medical condition and Propofol infusion  Airway Management Planned: Natural Airway and Mask  Additional Equipment: None  Intra-op Plan:   Post-operative Plan:   Informed Consent: I have reviewed the patients History and Physical, chart, labs and discussed the procedure including the risks, benefits and alternatives for the proposed anesthesia with the patient or authorized representative who has indicated his/her understanding and acceptance.     Plan Discussed with: CRNA  Anesthesia Plan Comments:         Anesthesia Quick Evaluation

## 2018-04-18 ENCOUNTER — Encounter (HOSPITAL_COMMUNITY): Payer: Self-pay | Admitting: Orthopedic Surgery

## 2018-04-18 DIAGNOSIS — I471 Supraventricular tachycardia: Secondary | ICD-10-CM

## 2018-04-18 DIAGNOSIS — I358 Other nonrheumatic aortic valve disorders: Secondary | ICD-10-CM

## 2018-04-18 DIAGNOSIS — Z978 Presence of other specified devices: Secondary | ICD-10-CM

## 2018-04-18 DIAGNOSIS — I351 Nonrheumatic aortic (valve) insufficiency: Secondary | ICD-10-CM

## 2018-04-18 LAB — CULTURE, BLOOD (ROUTINE X 2)
Culture: NO GROWTH
Culture: NO GROWTH
Special Requests: ADEQUATE
Special Requests: ADEQUATE

## 2018-04-18 LAB — BASIC METABOLIC PANEL
Anion gap: 7 (ref 5–15)
BUN: 36 mg/dL — ABNORMAL HIGH (ref 8–23)
CALCIUM: 7.9 mg/dL — AB (ref 8.9–10.3)
CO2: 26 mmol/L (ref 22–32)
Chloride: 103 mmol/L (ref 98–111)
Creatinine, Ser: 0.95 mg/dL (ref 0.44–1.00)
GFR calc Af Amer: >60 mL/min (ref >60)
GFR calc non Af Amer: >60 mL/min (ref >60)
Glucose, Bld: 190 mg/dL — ABNORMAL HIGH (ref 70–99)
Potassium: 4 mmol/L (ref 3.5–5.1)
Sodium: 136 mmol/L (ref 135–145)

## 2018-04-18 LAB — CBC
HCT: 29.5 % — ABNORMAL LOW (ref 36.0–46.0)
Hemoglobin: 8.9 g/dL — ABNORMAL LOW (ref 12.0–15.0)
MCH: 30.1 pg (ref 26.0–34.0)
MCHC: 30.2 g/dL (ref 30.0–36.0)
MCV: 99.7 fL (ref 80.0–100.0)
Platelets: 69 10*3/uL — ABNORMAL LOW (ref 150–400)
RBC: 2.96 MIL/uL — ABNORMAL LOW (ref 3.87–5.11)
RDW: 16.9 % — ABNORMAL HIGH (ref 11.5–15.5)
WBC: 15.1 10*3/uL — AB (ref 4.0–10.5)
nRBC: 0.5 % — ABNORMAL HIGH (ref 0.0–0.2)

## 2018-04-18 LAB — MAGNESIUM: Magnesium: 2.4 mg/dL (ref 1.7–2.4)

## 2018-04-18 MED ORDER — ONDANSETRON HCL 4 MG PO TABS
4.0000 mg | ORAL_TABLET | Freq: Four times a day (QID) | ORAL | Status: DC | PRN
  Filled 2018-04-18: qty 1

## 2018-04-18 MED ORDER — METOPROLOL TARTRATE 12.5 MG HALF TABLET
12.5000 mg | ORAL_TABLET | Freq: Two times a day (BID) | ORAL | Status: DC
  Filled 2018-04-18: qty 1

## 2018-04-18 MED ORDER — METOCLOPRAMIDE HCL 5 MG/ML IJ SOLN: 5.0000 mg | Freq: Three times a day (TID) | INTRAMUSCULAR | Status: DC | PRN

## 2018-04-18 MED ORDER — ONDANSETRON HCL 4 MG/2ML IJ SOLN: 4.0000 mg | Freq: Four times a day (QID) | INTRAMUSCULAR | Status: DC | PRN

## 2018-04-18 MED ORDER — HYDROCODONE-ACETAMINOPHEN 5-325 MG PO TABS
1.0000 | ORAL_TABLET | ORAL | Status: DC | PRN
  Administered 2018-04-18 – 2018-04-20 (×2): 2 via ORAL
  Filled 2018-04-18 (×2): qty 2

## 2018-04-18 MED ORDER — DOCUSATE SODIUM 100 MG PO CAPS
100.0000 mg | ORAL_CAPSULE | Freq: Two times a day (BID) | ORAL | Status: DC
  Administered 2018-04-18 – 2018-04-23 (×11): 100 mg via ORAL
  Filled 2018-04-18 (×11): qty 1

## 2018-04-18 MED ORDER — AMIODARONE HCL 200 MG PO TABS
400.0000 mg | ORAL_TABLET | Freq: Two times a day (BID) | ORAL | Status: DC
  Administered 2018-04-18 – 2018-04-23 (×12): 400 mg via ORAL
  Filled 2018-04-18 (×12): qty 2

## 2018-04-18 MED ORDER — HYDROCORTISONE NA SUCCINATE PF 100 MG IJ SOLR
50.0000 mg | Freq: Two times a day (BID) | INTRAMUSCULAR | Status: DC
  Administered 2018-04-18 – 2018-04-19 (×2): 50 mg via INTRAVENOUS
  Filled 2018-04-18 (×2): qty 2

## 2018-04-18 MED ORDER — METOCLOPRAMIDE HCL 5 MG PO TABS: 5.0000 mg | ORAL_TABLET | Freq: Three times a day (TID) | ORAL | Status: DC | PRN

## 2018-04-18 NOTE — Op Note (Signed)
NAME: Elizabeth [name], Elizabeth [name] MEDICAL RECORD [id number] ACCOUNT [id number] DATE OF BIRTH:07-Oct-1950 FACILITY: [facility] LOCATION: [facility] PHYSICIAN:Tykwon Fera Zella Ball, MD  OPERATIVE REPORT  DATE OF PROCEDURE:  04/17/2018  PREOPERATIVE DIAGNOSIS:  Septic arthritis, left hip.  POSTOPERATIVE DIAGNOSIS:  Septic arthritis, left hip.  PROCEDURE:  Irrigation and debridement, left hip.  SURGEON:  Gaynelle Arabian, MD  ASSISTANT:  Ardeen Jourdain, PA-C  ANESTHESIA:  General.  ESTIMATED BLOOD LOSS:  10 mL.  DRAINS:  Hemovac x1.  COMPLICATIONS:  None.  CONDITION:  Stable to recovery.  BRIEF CLINICAL NOTE:  The patient is a 67 year old female who had septic arthritis of her left hip.  She presented to the hospital a week ago in sepsis.  She was just recently stabilized and presents now for irrigation and debridement of her hip.  PROCEDURE IN DETAIL:  After successful administration of general anesthetic, the patient's feet were placed in traction boots and she was placed on the Hana table.  The left thigh was then isolated from the perineum with plastic drapes and prepped and  draped in the usual sterile fashion.  Incision for the anterior approach to the hip was made.  Skin cut with a 10 blade through subcutaneous tissue to the level of the fascia of the tensor fascia lata muscle.  The fascia was incised, and then the muscle  was peeled off the undersurface of the fascia.  The interval between the tensor fascia lata and rectus femoris was identified, and then the ascending branch of the lateral femoral circumflex was identified and cauterized.  I then isolated the anterior  capsule of the hip and made a capsulotomy, removing a small portion of the capsule to gain access to the joint.  There was a very small amount of cloudy fluid in the joint, which was sent for stat Gram stain and culture and sensitivity.  I widened the  arthrotomy in order to gain access to the joint, and again there was not much fluid at  all.  I thoroughly irrigated the joint with 3 L of saline using pulsatile lavage.  We left open the capsule to allow for further drainage if any more fluid developed.   The Hemovac drain was then placed deep, and the fascia of the tensor fascia lata was then closed with a Stratafix suture.  Subcu was closed with interrupted 2-0 Vicryl and subcuticular running 4-0 Monocryl.  The drain was hooked to suction.  Incision  cleaned and dried and sterile dressing applied.  She was then awakened and transported to recovery in stable condition.  LN/NUANCE  D:04/17/2018 T:04/18/2018 JOB:[id number]

## 2018-04-18 NOTE — Progress Notes (Signed)
[facility]   Reason for visit: Follow up on bacteremia  Interval History: no new issues, followed by cardiology and will need cath and CVTS evaluation at some point; septic arthritis debrided yesterday evening and no growth so far; repeat blood cultures remain negative.     Physical Exam: Constitutional:  Vitals:   04/18/18 0755 04/18/18 0800  BP: (!) 100/43   Pulse: 66   Resp: (!) 22   Temp:  (!) 97.5 F (36.4 C)  SpO2: 97%    patient appears in NAD Eyes: anicteric Respiratory: Normal respiratory effort; CTA B Cardiovascular: RRR GI: soft, nt, nd  Review of Systems: Constitutional: negative for fevers and chills Gastrointestinal: negative for nausea and diarrhea Integument/breast: negative for rash  Lab Results  Component Value Date   WBC 15.1 (H) 04/18/2018   HGB 8.9 (L) 04/18/2018   HCT 29.5 (L) 04/18/2018   MCV 99.7 04/18/2018   PLT 69 (L) 04/18/2018    Lab Results  Component Value Date   CREATININE 0.95 04/18/2018   BUN 36 (H) 04/18/2018   NA 136 04/18/2018   K 4.0 04/18/2018   CL 103 04/18/2018   CO2 26 04/18/2018    Lab Results  Component Value Date   ALT 13 04/11/2018   AST 25 04/11/2018   ALKPHOS 53 04/11/2018     Microbiology: Recent Results (from the past 240 hour(s))  Culture, blood (routine x 2)     Status: Abnormal   Collection Time: 04/09/18  4:01 PM  Result Value Ref Range Status   Specimen Description   Final    BLOOD RIGHT ANTECUBITAL Performed at [facility], [place] [address], [place], [place] [address]    Special Requests   Final    BOTTLES DRAWN AEROBIC AND ANAEROBIC Blood Culture adequate volume Performed at [facility] [address], [place], [place] [address]    Culture  Setup Time   Final    GRAM POSITIVE COCCI IN CLUSTERS IN BOTH AEROBIC AND ANAEROBIC BOTTLES CRITICAL RESULT CALLED TO, READ BACK BY AND VERIFIED WITH: PHAM ANH PHARMD AT 1000 ON [id number]  BY SJW    Culture (A)  Final    STAPHYLOCOCCUS LUGDUNENSIS SUSCEPTIBILITIES PERFORMED ON PREVIOUS CULTURE WITHIN THE LAST 5 DAYS. Performed at [facility], [place] [address], [place], [place] [address]    Report Status 04/13/2018 FINAL  Final  Blood Culture ID Panel (Reflexed)     Status: Abnormal   Collection Time: 04/09/18  4:01 PM  Result Value Ref Range Status   Enterococcus species NOT DETECTED NOT DETECTED Final   Listeria monocytogenes NOT DETECTED NOT DETECTED Final   Staphylococcus species DETECTED (A) NOT DETECTED Final    Comment: Methicillin (oxacillin) susceptible coagulase negative staphylococcus. Possible blood culture contaminant (unless isolated from more than one blood culture draw or clinical case suggests pathogenicity). No antibiotic treatment is indicated for blood  culture contaminants. CRITICAL RESULT CALLED TO, READ BACK BY AND VERIFIED WITH: PHAM ANH PHARMD AT 1000 ON [id number] BY SJW    Staphylococcus aureus (BCID) NOT DETECTED NOT DETECTED Final   Methicillin resistance NOT DETECTED NOT DETECTED Final   Streptococcus species NOT DETECTED NOT DETECTED Final   Streptococcus agalactiae NOT DETECTED NOT DETECTED Final   Streptococcus pneumoniae NOT DETECTED NOT DETECTED Final   Streptococcus pyogenes NOT DETECTED NOT DETECTED Final   Acinetobacter baumannii NOT DETECTED NOT DETECTED Final   Enterobacteriaceae species NOT DETECTED NOT DETECTED Final  Enterobacter cloacae complex NOT DETECTED NOT DETECTED Final   Escherichia coli NOT DETECTED NOT DETECTED Final   Klebsiella oxytoca NOT DETECTED NOT DETECTED Final   Klebsiella pneumoniae NOT DETECTED NOT DETECTED Final   Proteus species NOT DETECTED NOT DETECTED Final   Serratia marcescens NOT DETECTED NOT DETECTED Final   Haemophilus influenzae NOT DETECTED NOT DETECTED Final   Neisseria meningitidis NOT DETECTED NOT DETECTED Final   Pseudomonas aeruginosa NOT DETECTED NOT DETECTED Final   Candida albicans NOT DETECTED  NOT DETECTED Final   Candida glabrata NOT DETECTED NOT DETECTED Final   Candida krusei NOT DETECTED NOT DETECTED Final   Candida parapsilosis NOT DETECTED NOT DETECTED Final   Candida tropicalis NOT DETECTED NOT DETECTED Final    Comment: Performed at [facility], [place] [address] [place], [place] [address]  Culture, blood (routine x 2)     Status: Abnormal   Collection Time: 04/09/18  4:06 PM  Result Value Ref Range Status   Specimen Description   Final    BLOOD LEFT ANTECUBITAL Performed at [facility] [address], [place], [place] [address]    Special Requests   Final    BOTTLES DRAWN AEROBIC AND ANAEROBIC Blood Culture adequate volume Performed at [facility] [address] [place], [place] [address]    Culture  Setup Time   Final    GRAM POSITIVE COCCI IN CLUSTERS IN BOTH AEROBIC AND ANAEROBIC BOTTLES CRITICAL VALUE NOTED.  VALUE IS CONSISTENT WITH PREVIOUSLY REPORTED AND CALLED VALUE. Performed at [facility], [place] [address] [place], [place] [address]    Culture STAPHYLOCOCCUS LUGDUNENSIS (A)  Final   Report Status 04/13/2018 FINAL  Final   Organism ID, Bacteria STAPHYLOCOCCUS LUGDUNENSIS  Final      Susceptibility   Staphylococcus lugdunensis - MIC*    CIPROFLOXACIN <=0.5 SENSITIVE Sensitive     ERYTHROMYCIN >=8 RESISTANT Resistant     GENTAMICIN <=0.5 SENSITIVE Sensitive     OXACILLIN 0.5 SENSITIVE Sensitive     TETRACYCLINE <=1 SENSITIVE Sensitive     VANCOMYCIN <=0.5 SENSITIVE Sensitive     TRIMETH/SULFA <=10 SENSITIVE Sensitive     CLINDAMYCIN RESISTANT Resistant     RIFAMPIN <=0.5 SENSITIVE Sensitive     Inducible Clindamycin POSITIVE Resistant     * STAPHYLOCOCCUS LUGDUNENSIS  MRSA PCR Screening     Status: None   Collection Time: 04/09/18 10:41 PM  Result Value Ref Range Status   MRSA by PCR NEGATIVE NEGATIVE Final    Comment:        The GeneXpert MRSA Assay (FDA approved for NASAL specimens only),  is one component of a comprehensive MRSA colonization surveillance program. It is not intended to diagnose MRSA infection nor to guide or monitor treatment for MRSA infections. Performed at [facility], [place] [address], [place], [place] [address]   Body fluid culture     Status: None   Collection Time: 04/10/18  5:15 PM  Result Value Ref Range Status   Specimen Description   Final    SYNOVIAL Performed at [facility] [address] [place], [place] [address]    Special Requests   Final    HIP Performed at [facility], [place] [address] [place], [place] [address]    Gram Stain   Final    RARE WBC PRESENT,BOTH PMN AND MONONUCLEAR NO ORGANISMS SEEN    Culture   Final    RARE STAPHYLOCOCCUS  LUGDUNENSIS CRITICAL RESULT CALLED TO, READ BACK BY AND VERIFIED WITH: RN Jerre Simon [id number] AT 24 AM BY CN Performed at [facility], [place] [address], [place], [place] [address]    Report Status 04/14/2018 FINAL  Final   Organism ID, Bacteria STAPHYLOCOCCUS LUGDUNENSIS  Final      Susceptibility   Staphylococcus lugdunensis - MIC*    CIPROFLOXACIN <=0.5 SENSITIVE Sensitive     ERYTHROMYCIN >=8 RESISTANT Resistant     GENTAMICIN <=0.5 SENSITIVE Sensitive     OXACILLIN 0.5 SENSITIVE Sensitive     TETRACYCLINE <=1 SENSITIVE Sensitive     VANCOMYCIN <=0.5 SENSITIVE Sensitive     TRIMETH/SULFA <=10 SENSITIVE Sensitive     CLINDAMYCIN RESISTANT Resistant     RIFAMPIN <=0.5 SENSITIVE Sensitive     Inducible Clindamycin POSITIVE Resistant     * RARE STAPHYLOCOCCUS LUGDUNENSIS  Culture, blood (Routine X 2) w Reflex to ID Panel     Status: None (Preliminary result)   Collection Time: 04/11/18  4:38 PM  Result Value Ref Range Status   Specimen Description   Final    BLOOD LEFT HAND Performed at [facility] [address] [place], [place] [address]    Special Requests   Final    BOTTLES DRAWN AEROBIC AND  ANAEROBIC Blood Culture adequate volume Performed at [facility] [address], [place], [place] [address]    Culture   Final    NO GROWTH 4 DAYS Performed at [facility], [place] [address], [place], [place] [address]    Report Status PENDING  Incomplete  Culture, blood (Routine X 2) w Reflex to ID Panel     Status: None (Preliminary result)   Collection Time: 04/11/18  4:45 PM  Result Value Ref Range Status   Specimen Description   Final    BLOOD RIGHT HAND Performed at [facility] [address], [place], [place] [address]    Special Requests   Final    BOTTLES DRAWN AEROBIC AND ANAEROBIC Blood Culture adequate volume Performed at [facility] [address] [place], [place] [address]    Culture   Final    NO GROWTH 4 DAYS Performed at [facility], [place] [address] [place], [place] [address]    Report Status PENDING  Incomplete  Aerobic/Anaerobic Culture (surgical/deep wound)     Status: None (Preliminary result)   Collection Time: 04/17/18  7:52 PM  Result Value Ref Range Status   Specimen Description   Final    SYNOVIAL LEFT HIP Performed at [facility] [address] [place], [place] [address]    Special Requests   Final    NONE Performed at [facility], [place] [address], [place], [place] [address]    Gram Stain   Final    FEW WBC PRESENT, PREDOMINANTLY PMN NO ORGANISMS SEEN    Culture   Final    NO GROWTH < 12 HOURS Performed at [facility] [address] [place], [place] [address]    Report Status PENDING  Incomplete    Impression/Plan:  1. Staph lugdunensis - oxicillin sensitive.  On cefazolin.  Repeat cultures ngtd. No changes.   2. AV endocarditis - severe AI.  Treatment as above. Will need cath and CVTS at some point per cardiology.  Continuing treatment as above.   3.  Septic arthritis - s/p debridement and drain in place.  Cultures  ngtd.

## 2018-04-18 NOTE — Progress Notes (Addendum)
Progress Note  Patient Name: Elizabeth [name] Date of Encounter: 04/18/2018  Primary Cardiologist: Skeet Latch, MD   Subjective   Feeling tired this morning. No chest pain. SOB continues to improve. Mild left hip pain s/p washout/debridement 04/17/18.   Inpatient Medications    Scheduled Meds: . aspirin  81 mg Oral Daily  . Chlorhexidine Gluconate Cloth  6 each Topical Daily  . docusate sodium  100 mg Oral BID  . enoxaparin (LOVENOX) injection  40 mg Subcutaneous Q24H  . feeding supplement (PRO-STAT SUGAR FREE 64)  30 mL Oral BID  . hydrocortisone sodium succinate  50 mg Intravenous Q6H  . mouth rinse  15 mL Mouth Rinse BID  . metoprolol tartrate  2.5 mg Intravenous Q6H  . midodrine  10 mg Oral TID WC  . multivitamin with minerals  1 tablet Oral Daily  . sodium chloride flush  10-40 mL Intracatheter Q12H   Continuous Infusions: . amiodarone 30 mg/hr (04/18/18 0730)  .  ceFAZolin (ANCEF) IV Stopped (04/18/18 0640)   PRN Meds: acetaminophen, fentaNYL (SUBLIMAZE) injection, HYDROcodone-acetaminophen, metoCLOPramide **OR** metoCLOPramide (REGLAN) injection, ondansetron **OR** ondansetron (ZOFRAN) IV, promethazine, sodium chloride flush   Vital Signs    Vitals:   04/18/18 0600 04/18/18 0700 04/18/18 0755 04/18/18 0800  BP: (!) 118/54 (!) 115/50 (!) 100/43   Pulse: 60 (!) 55 66   Resp: 16 14 (!) 22   Temp:    (!) 97.5 F (36.4 C)  TempSrc:    Axillary  SpO2: 96% 99% 97%   Weight:      Height:        Intake/Output Summary (Last 24 hours) at 04/18/2018 1004 Last data filed at 04/18/2018 0548 Gross per 24 hour  Intake 805.85 ml  Output 580 ml  Net 225.85 ml   Filed Weights   04/17/18 0500 04/17/18 1159 04/18/18 0500  Weight: 124.4 kg 124.4 kg 111.5 kg    Telemetry    No tachyarrhythmias; sinus rhythm - still with frequent ectopy.  - Personally Reviewed  Physical Exam   GEN: No acute distress.   Neck: No JVD, no carotid bruits Cardiac: IRIR (ectopy),  +murmur, no rubs or gallops.  Respiratory: Clear to auscultation bilaterally, no wheezes/ rales/ rhonchi GI: NABS, Soft, obese, nontender, non-distended  MS: No edema; No deformity. Neuro:  Nonfocal, moving all extremities spontaneously Psych: Normal affect   Labs    Chemistry Recent Labs  Lab 04/11/18 2223  04/16/18 0230 04/16/18 0403 04/18/18 0851  NA 136   < > 137 137 136  K 4.2   < > 4.6 4.6 4.0  CL 105   < > 103 104 103  CO2 22   < > 27 27 26   GLUCOSE 149*   < > 156* 150* 190*  BUN 15   < > 34* 32* 36*  CREATININE 0.78   < > 0.93 0.93 0.95  CALCIUM 7.6*   < > 8.1* 7.9* 7.9*  PROT 5.7*  --   --   --   --   ALBUMIN 2.2*  --   --   --   --   AST 25  --   --   --   --   ALT 13  --   --   --   --   ALKPHOS 53  --   --   --   --   BILITOT 1.3*  --   --   --   --   GFRNONAA >60   < > >  60 >60 >60  GFRAA >60   < > >60 >60 >60  ANIONGAP 9   < > 7 6 7    < > = values in this interval not displayed.     Hematology Recent Labs  Lab 04/15/18 0500 04/16/18 0403 04/18/18 0851  WBC 22.5* 17.8* 15.1*  RBC 3.21* 3.07* 2.96*  HGB 9.5* 9.3* 8.9*  HCT 30.5* 30.0* 29.5*  MCV 95.0 97.7 99.7  MCH 29.6 30.3 30.1  MCHC 31.1 31.0 30.2  RDW 15.2 15.2 16.9*  PLT 76* 64* 69*    Cardiac Enzymes Recent Labs  Lab 04/13/18 0107 04/16/18 2320 04/17/18 0503 04/17/18 1105  TROPONINI 0.07* 0.05* 0.05* 0.06*   No results for input(s): TROPIPOC in the last 168 hours.   BNPNo results for input(s): BNP, PROBNP in the last 168 hours.   DDimer No results for input(s): DDIMER in the last 168 hours.   Radiology    Dg Chest Port 1 View  Result Date: 04/16/2018 CLINICAL DATA:  Shortness of breath. EXAM: PORTABLE CHEST 1 VIEW COMPARISON:  Chest x-ray dated April 10, 2018. FINDINGS: Interval placement of a right upper extremity PICC line with the tip at the cavoatrial junction. Stable cardiomediastinal silhouette. New mild pulmonary vascular congestion. Increasing pleural fluid at the  right costophrenic angle and along the right minor fissure. Possible trace left pleural effusion. Bibasilar atelectasis. Postsurgical changes in the right lung and hilum. No pneumothorax. No acute osseous abnormality. IMPRESSION: 1. New mild pulmonary vascular congestion with increasing small right and trace left pleural effusions. 2. Bibasilar atelectasis. Electronically Signed   By: Titus Dubin M.D.   On: 04/16/2018 22:57    Cardiac Studies   Echocardiogram 04/12/18: Study Conclusions  - Left ventricle: The cavity size was normal. Systolic function was normal. The estimated ejection fraction was in the range of 60% to 65%. Wall motion was normal; there were no regional wall motion abnormalities. - Aortic valve: Trileaflet; severely thickened, severely calcified leaflets. 1.5 x 2 cm calcified aortic mass noted (possible old calcified vegetation). This is new when compared to prior echocardiogram. Valve mobility was restricted. There was mild regurgitation. - Mitral valve: There was moderate regurgitation. - Tricuspid valve: There was moderate regurgitation.  Transesophageal Echocardiogram 04/17/18: Findings: Please see echo section for full report. Normal LV size and wall thickness.  EF 55-60% with normal wall motion.  Normal right ventricular size and systolic function.  Moderate left atrial enlargement, no LA appendage thrombus.  Normal right atrium.  No PFO or ASD by color doppler.  Mild TR, peak RV-RA gradient 45 mmHg.  No TV vegetation.  There was moderate central mitral regurgitation.  ERO by PISA was only 0.14 cm^2 but visually 3+ MR.  There did not appear to be a mitral valve vegetation.  No systolic flow reversal in the pulmonary vein doppler pattern.  The aortic valve was trileaflet with a large vegetation that appeared to involve all leaflets.  There did appear to be be perforation.  There appeared to be severe, eccentric aortic insufficiency with holodiastolic  flow reversal in the ascending thoracic aorta.  Mean gradient across the aortic valve was 32 mmHg, suggesting moderate aortic stenosis (elevated gradient was likely due to a combination of high flow from severe AI and leaflet impingement from bulky vegetation.   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  3+ mitral regurgitation without vegetation noted.    Patient Profile  67 y.o.femalewith past medical history of hypertension, hyperlipidemia, previous lung cancer status post lobectomyandmild aortic stenosis.Patient admitted December 8 with left hip pain, fever and hypotension. She is being treated for septic arthritis.Cardiology consulted for SVT.Also found to have an aortic mass noted on TTE (possible old calcified vegetation).  Assessment & Plan    1. Aortic valve endocarditis: TTE with evidence of calcified vegetation. TEE confirmed aortic valve endocarditis with severe eccentric AI and perforation. She is currently being maintained on IV antibiotics for septic arthritis with Staph lugdunensis bacteremia. BCx as of 04/11/18 with NGTD. She underwent wash out/debridement of left hip 04/17/18 with NGTD from synovial fluid. Ultimately will need CT surgery evaluation to determine candidacy for aortic valve replacement. - Will need a cardiac catheterization for coronary artery evaluation as part of work-up for AVR - will discuss timing with Dr. Sallyanne Kuster - Will need CT surgery evaluation - will discuss timing with Dr. Sallyanne Kuster  2. Recurrent SVT: Seen by EP this admission and started on IV amiodarone for SVT/atrial tachycardia. Could be considered for EPS/Ablation in the future if rhythm difficult to control. No tachyarrhythmias on telemetry overnight - Will transition to amiodarone 400mg  BID today   3. Septic arthritis: patient presented with L hip pain following a steroid injection. Found to have sepsis/  staph lugdunensis bacteremia. Underwent left hip wash out/debridement with ortho 04/17/18 with drain in place. ID following. On IV cefazolin per sensitivities.  - Continue management per primary team, ID, and ortho   For questions or updates, please contact [place] Please consult [email or web address] for contact info under Cardiology/STEMI.      Signed, Abigail Butts, PA-C  04/18/2018, 10:04 AM   [phone]  I have seen and examined the patient along with Abigail Butts, PA-C .  I have reviewed the chart, notes and new data.  I agree with PA/NP's note.  Key new complaints: Was able to lie almost flat last night.  Denies dyspnea. Key examination changes: Her blood pressure shows a very wide pulse pressure, but otherwise appears hemodynamically compensated, albeit while receiving vasoconstrictor medications.  She is not tachycardic.  Lungs are clear.  The aortic murmur is hard to hear. Key new findings / data: Viewed the transthoracic and transesophageal echocardiogram findings.  Suspect that she must of had some underlying aortic insufficiency with volume overload that preceded the acute severe AI, otherwise it is unlikely she will tolerated this well.  Markedly reduced frequency of arrhythmia on monitor.  No sustained SVT.  Continues to have frequent PACs some of them conducted aberrantly.  PLAN: Continue oral amiodarone.  We will stop the metoprolol since her baseline sinus rate is now approximately 60.  This will hopefully allow [place] to reduce the vasoconstrictors as well. Discussed the appropriate timing of cardiac catheterization as a prelude to aortic valve surgery.  Ideally would like her to complete antibiotic therapy and give as much time as possible for the vegetation to become organized and healed, thereby lessening the chance of dislodging the vegetation and causing embolic events. Anticipate that she will require SNF.  She still has pain following her right knee replacement  and now has a painful left hip.  She is very obese and deconditioned.  Sanda Klein, MD, [place] [phone] 04/18/2018, 1:52 PM

## 2018-04-18 NOTE — Progress Notes (Signed)
Nutrition Follow-up  DOCUMENTATION CODES:   Morbid obesity  INTERVENTION:  - Continue Prostat BID. - Continue to encourage PO intakes. - Will continue to monitor for need for further diet education.    NUTRITION DIAGNOSIS:   Increased nutrient needs related to acute illness, wound healing as evidenced by estimated needs. -ongoing  GOAL:   Patient will meet greater than or equal to 90% of their needs -variably met  MONITOR:   PO intake, Supplement acceptance, Weight trends, Labs, Skin  ASSESSMENT:   67 year old female with history of HTN and osteoarthritis. She received a steroid injection into L hip in mid-November and shortly after she started having severe L hip pain. She developed fever 1 week ago which resolved and recently returned. She presented to the ED with L hip pain and urinary frequency. She was found to be hypotensive and highly febrile with temperature of 105 degrees F.  Current weight consistent with admission weight. Patient had TEE and I&D L hip on 12/16. Diet re-advanced to Heart Healthy yesterday after procedures. Patient reports eating 100% of a [place] sub from [employer] for dinner yesterday and that she ate 100% of breakfast this AM without issue. She does not like Prostat but after discussion, understands the importance of it and is agreeable to consuming it.   Daughter at bedside and asks about how much Na patient is permitted to have per day. Encouraged 2 grams or less of sodium per day.   Per Dr. Phoebe Sharps note 12/16: patient will likely need to transfer to [facility] for cardiac cath and possible surgery--Cardiology to determine, volume overload   Medications reviewed; 100 mg Colace BID, 50 mg Solu-Cortef BID, daily multivitamin with minerals.  Labs reviewed; BUN: 36 mg/dL, Ca: 7.9 mg/dL.     Diet Order:   Diet Order            Diet Heart Room service appropriate? Yes; Fluid consistency: Thin  Diet effective now              EDUCATION NEEDS:    Education needs have been addressed  Skin:  Skin Assessment: Skin Integrity Issues: Skin Integrity Issues:: Incisions Stage II: both sides of sacrum Incisions: L hip (12/16)  Last BM:  12/15  Height:   Ht Readings from Last 1 Encounters:  04/17/18 5' 4" (1.626 m)    Weight:   Wt Readings from Last 1 Encounters:  04/18/18 111.5 kg    Ideal Body Weight:  54.54 kg  BMI:  Body mass index is 42.19 kg/m.  Estimated Nutritional Needs:   Kcal:  2010-2230 kcal  Protein:  120-130 grams  Fluid:  >/= 2 L/day     Jarome Matin, MS, RD, LDN, [facility] Inpatient Clinical Dietitian Pager # [phone] After hours/weekend pager # [phone]

## 2018-04-18 NOTE — Care Management Note (Signed)
Case Management Note  Patient Details  Name: Elizabeth [name] MRN: [id number] Date of Birth: Jul 29, 1950  Subjective/Objective:                   Discharge readiness is indicated by patient meeting Recovery Milestones, including ALL of the following: ? Hemodynamic stability ? Fever absent or reduced ? 98.78 ? Hypoxemia absent ? Nasal cannula  At 3l/min ? Mental status at baseline ? End-organ dysfunction (eg, myocardial ischemia, renal failure) absent   ? A.fib/elevated troponin ? Metabolic derangement (eg, dehydration, acidosis) absent ? Cultures negative or infection identified and under adequate treatment ? Ambulatory not ambulatory ? Oral hydration, medications[P] ?    Action/Plan: Iv amiodarone, iv neosynephrine drip, hemodynamic unstable   Expected Discharge Date:                  Expected Discharge Plan:  Home/Self Care  In-House Referral:     Discharge planning Services  CM Consult  Post Acute Care Choice:    Choice offered to:     DME Arranged:    DME Agency:     HH Arranged:    HH Agency:     Status of Service:  In process, will continue to follow  If discussed at Long Length of Stay Meetings, dates discussed:    Additional Comments:  Leeroy Cha, RN 04/18/2018, 9:09 AM

## 2018-04-18 NOTE — Progress Notes (Signed)
   Subjective: 1 Day Post-Op Procedure(s) (LRB): IRRIGATION AND DEBRIDEMENT LEFT HIP (ANTERIOR) (Left) Patient reports pain as mild. Endorses weakness. Good output from hemovac drain. Patient seen in rounds for Dr. Wynelle Link.  Objective: Vital signs in last 24 hours: Temp:  [97.5 F (36.4 C)-98.7 F (37.1 C)] 97.9 F (36.6 C) (12/17 0400) Pulse Rate:  [35-73] 60 (12/17 0600) Resp:  [14-29] 16 (12/17 0600) BP: (76-143)/(36-86) 118/54 (12/17 0600) SpO2:  [92 %-100 %] 96 % (12/17 0600) Weight:  [111.5 kg-124.4 kg] 111.5 kg (12/17 0500)  Intake/Output from previous day:  Intake/Output Summary (Last 24 hours) at 04/18/2018 0717 Last data filed at 04/18/2018 0548 Gross per 24 hour  Intake 1002.51 ml  Output 580 ml  Net 422.51 ml    Labs: Recent Labs    04/16/18 0403  HGB 9.3*   Recent Labs    04/16/18 0403  WBC 17.8*  RBC 3.07*  HCT 30.0*  PLT 64*   Recent Labs    04/16/18 0230 04/16/18 0403  NA 137 137  K 4.6 4.6  CL 103 104  CO2 27 27  BUN 34* 32*  CREATININE 0.93 0.93  GLUCOSE 156* 150*  CALCIUM 8.1* 7.9*   Exam: General - Patient is Alert and Oriented Extremity - Neurologically intact Neurovascular intact Sensation intact distally Dorsiflexion/Plantar flexion intact Dressing - dressing C/D/I Motor Function - intact, moving foot and toes well on exam.   Past Medical History:  Diagnosis Date  . Aortic stenosis, mild 01/08/2015  . Dyspnea    with rest   . Dysrhythmia   . Hx of cancer of lung 6/10   Removed June 2010  . Hyperlipidemia   . Hypertension   . Low back pain   . Morbid obesity ([place]) 01/08/2015  . Right foot injury    report she sustained a fracture to her right , present with ortho boot on affected extremity   . 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   Weight as of this encounter: 111.5 kg. Up with therapy  DVT Prophylaxis - Aspirin and Lovenox Up ad lib as tolerated Hemovac drain left in place, will order PT evaluation  Intraoperative gram stain showed no organisms with predominant PMNs. Will follow culture results.   Theresa Duty, PA-C Orthopedic Surgery 04/18/2018, 7:17 AM

## 2018-04-18 NOTE — Discharge Instructions (Addendum)
1. Please obtain vital signs at least one time daily 2.Please weigh the patient daily. If he or she continues to gain weight or develops lower extremity edema, contact the office at [phone]. 3. Ambulate patient at least three times daily and please use sternal precautions.   Information on my medicine - Coumadin   (Warfarin)  Why was Coumadin prescribed for you? Coumadin was prescribed for you because you have a blood clot or a medical condition that can cause an increased risk of forming blood clots. Blood clots can cause serious health problems by blocking the flow of blood to the heart, lung, or brain. Coumadin can prevent harmful blood clots from forming. As a reminder your indication for Coumadin is:   Blood Clot Prevention After Heart Valve Surgery  What test will check on my response to Coumadin? While on Coumadin (warfarin) you will need to have an INR test regularly to ensure that your dose is keeping you in the desired range. The INR (international normalized ratio) number is calculated from the result of the laboratory test called prothrombin time (PT).  If an INR APPOINTMENT HAS NOT ALREADY BEEN MADE FOR YOU please schedule an appointment to have this lab work done by your health care provider within 7 days. Your INR goal is usually a number between:  2 to 3 or your provider may give you a more narrow range like 2-2.5.  Ask your health care provider during an office visit what your goal INR is.  What  do you need to  know  About  COUMADIN? Take Coumadin (warfarin) exactly as prescribed by your healthcare provider about the same time each day.  DO NOT stop taking without talking to the doctor who prescribed the medication.  Stopping without other blood clot prevention medication to take the place of Coumadin may increase your risk of developing a new clot or stroke.  Get refills before you run out.  What do you do if you miss a dose? If you miss a dose, take it as soon as you  remember on the same day then continue your regularly scheduled regimen the next day.  Do not take two doses of Coumadin at the same time.  Important Safety Information A possible side effect of Coumadin (Warfarin) is an increased risk of bleeding. You should call your healthcare provider right away if you experience any of the following: ? Bleeding from an injury or your nose that does not stop. ? Unusual colored urine (red or dark brown) or unusual colored stools (red or black). ? Unusual bruising for unknown reasons. ? A serious fall or if you hit your head (even if there is no bleeding).  Some foods or medicines interact with Coumadin (warfarin) and might alter your response to warfarin. To help avoid this: ? Eat a balanced diet, maintaining a consistent amount of Vitamin K. ? Notify your provider about major diet changes you plan to make. ? Avoid alcohol or limit your intake to 1 drink for women and 2 drinks for men per day. (1 drink is 5 oz. wine, 12 oz. beer, or 1.5 oz. liquor.)  Make sure that ANY health care provider who prescribes medication for you knows that you are taking Coumadin (warfarin).  Also make sure the healthcare provider who is monitoring your Coumadin knows when you have started a new medication including herbals and non-prescription products.  Coumadin (Warfarin)  Major Drug Interactions  Increased Warfarin Effect Decreased Warfarin Effect  Alcohol (large quantities)  Antibiotics (esp. Septra/Bactrim, Flagyl, Cipro) Amiodarone (Cordarone) Aspirin (ASA) Cimetidine (Tagamet) Megestrol (Megace) NSAIDs (ibuprofen, naproxen, etc.) Piroxicam (Feldene) Propafenone (Rythmol SR) Propranolol (Inderal) Isoniazid (INH) Posaconazole (Noxafil) Barbiturates (Phenobarbital) Carbamazepine (Tegretol) Chlordiazepoxide (Librium) Cholestyramine (Questran) Griseofulvin Oral Contraceptives Rifampin Sucralfate (Carafate) Vitamin K   Coumadin (Warfarin) Major Herbal  Interactions  Increased Warfarin Effect Decreased Warfarin Effect  Garlic Ginseng Ginkgo biloba Coenzyme Q10 Green tea St. Johns wort    Coumadin (Warfarin) FOOD Interactions  Eat a consistent number of servings per week of foods HIGH in Vitamin K (1 serving =  cup)  Collards (cooked, or boiled & drained) Kale (cooked, or boiled & drained) Mustard greens (cooked, or boiled & drained) Parsley *serving size only =  cup Spinach (cooked, or boiled & drained) Swiss chard (cooked, or boiled & drained) Turnip greens (cooked, or boiled & drained)  Eat a consistent number of servings per week of foods MEDIUM-HIGH in Vitamin K (1 serving = 1 cup)  Asparagus (cooked, or boiled & drained) Broccoli (cooked, boiled & drained, or raw & chopped) Brussel sprouts (cooked, or boiled & drained) *serving size only =  cup Lettuce, raw (green leaf, endive, romaine) Spinach, raw Turnip greens, raw & chopped   These websites have more information on Coumadin (warfarin):  FailFactory.se; VeganReport.com.au;  Discharge Instructions:  1. You may shower, please wash incisions daily with soap and water and keep dry.  If you wish to cover wounds with dressing you may do so but please keep clean and change daily.  No tub baths or swimming until incisions have completely healed.  If your incisions become red or develop any drainage please call our office at [phone]  2. No Driving until cleared by Dr. Vivi Martens office and you are no longer using narcotic pain medications  3. Monitor your weight daily.. Please use the same scale and weigh at same time... If you gain 5-10 lbs in 48 hours with associated lower extremity swelling, please contact our office at [phone]  4. Fever of 101.5 for at least 24 hours with no source, please contact our office at [phone]  5. Activity- up as tolerated, please walk at least 3 times per day.  Avoid strenuous activity, no lifting, pushing, or  pulling with your arms over 8-10 lbs for a minimum of 6 weeks  6. If any questions or concerns arise, please do not hesitate to contact our office at [phone]

## 2018-04-18 NOTE — Evaluation (Signed)
Physical Therapy Evaluation Patient Details Name: Elizabeth [name] MRN: [id number] DOB: 01/04/1951 Today's Date: 04/18/2018   History of Present Illness  67 yo female s/p R TKA 8/19, admitted 12/16 with septic arthritis of left hip, s/P I and D on 04/17/18.TEE 12/16.  Clinical Impression  The patient required extensive assist of 2 to stand x 1 at the bedside. The patient required much encouragement to mobilize. Patient may benefit from rehab setting. Pt admitted with above diagnosis. Pt currently with functional limitations due to the deficits listed below (see PT Problem List).  Pt will benefit from skilled PT to increase their independence and safety with mobility to allow discharge to the venue listed below.       Follow Up Recommendations SNF    Equipment Recommendations  None recommended by PT    Recommendations for Other Services       Precautions / Restrictions Precautions Precautions: Fall;Knee Precaution Comments: left ant. hip incision      Mobility  Bed Mobility Overal bed mobility: Needs Assistance Bed Mobility: Supine to Sit;Sit to Supine     Supine to sit: Max assist;+2 for physical assistance;+2 for safety/equipment Sit to supine: Max assist;+2 for physical assistance;+2 for safety/equipment   General bed mobility comments: assist with legs and with trunk. required 3 assist to get back into bed as patient ad migratred to foot of bed . Purple slide placed under buttocks   Transfers Overall transfer level: Needs assistance Equipment used: Rolling walker (2 wheeled) Transfers: Sit to/from Stand Sit to Stand: +2 safety/equipment;From elevated surface;+2 physical assistance;Max assist         General transfer comment: much effort to rise from bed. Patient stood briefly. Attempted a second time but concern was that patient would slide off of bed asbed is higher than patient's height.  Ambulation/Gait                Stairs            Wheelchair  Mobility    Modified Rankin (Stroke Patients Only)       Balance                                             Pertinent Vitals/Pain Pain Assessment: Faces Faces Pain Scale: Hurts little more Pain Location: left hip Pain Intervention(s): Premedicated before session;Monitored during session    [place] expects to be discharged to:: Private residence Living Arrangements: Children Available Help at Discharge: Family Type of Home: House Home Access: Stairs to enter Entrance Stairs-Rails: None Entrance Stairs-Number of Steps: 2 small steps Home Layout: One level Home Equipment: Environmental consultant - 2 wheels;Bedside commode;Cane - single point      Prior Function           Comments: had completed OPPT, ambulated x 15' with cane     Hand Dominance        Extremity/Trunk Assessment   Upper Extremity Assessment Upper Extremity Assessment: Overall WFL for tasks assessed    Lower Extremity Assessment Lower Extremity Assessment: RLE deficits/detail;LLE deficits/detail RLE Deficits / Details: did not demonstrate a SLR on the left, poor effort noted LLE Deficits / Details: required assistanc eto move the leg to bed edge    Cervical / Trunk Assessment Cervical / Trunk Assessment: Normal  Communication   Communication: No difficulties  Cognition Arousal/Alertness: Awake/alert Behavior During Therapy: [facility] for  tasks assessed/performed;Flat affect;Agitated Overall Cognitive Status: Within Functional Limits for tasks assessed                                 General Comments: easily agitated with lines and aspects of mobility      General Comments      Exercises Total Joint Exercises Short Arc Quad: AROM;Both;10 reps Heel Slides: AAROM;Both;10 reps Hip ABduction/ADduction: AAROM;Right;10 reps Straight Leg Raises: AAROM;Right;10 reps   Assessment/Plan    PT Assessment Patient needs continued PT services  PT Problem List  Decreased strength;Decreased range of motion;Decreased activity tolerance;Decreased mobility;Decreased knowledge of precautions;Decreased safety awareness;Decreased knowledge of use of DME;Pain       PT Treatment Interventions DME instruction;Therapeutic exercise;Gait training;Functional mobility training;Therapeutic activities;Patient/family education    PT Goals (Current goals can be found in the Care Plan section)  Acute Rehab PT Goals Patient Stated Goal: to walk again, get left hip fixed PT Goal Formulation: With patient/family Time For Goal Achievement: 04/18/18 Potential to Achieve Goals: Good    Frequency Min 3X/week   Barriers to discharge        Co-evaluation               AM-PAC PT "6 Clicks" Mobility  Outcome Measure Help needed turning from your back to your side while in a flat bed without using bedrails?: Total Help needed moving from lying on your back to sitting on the side of a flat bed without using bedrails?: Total Help needed moving to and from a bed to a chair (including a wheelchair)?: Total Help needed standing up from a chair using your arms (e.g., wheelchair or bedside chair)?: Total Help needed to walk in hospital room?: Total Help needed climbing 3-5 steps with a railing? : Total 6 Click Score: 6    End of Session   Activity Tolerance: Patient limited by pain;Patient limited by fatigue Patient left: in bed;with call bell/phone within reach;with nursing/sitter in room;with family/visitor present Nurse Communication: Mobility status PT Visit Diagnosis: Unsteadiness on feet (R26.81)    Time: 4008-6761 PT Time Calculation (min) (ACUTE ONLY): 41 min   Charges:   PT Evaluation $PT Eval Moderate Complexity: 1 Mod PT Treatments $Therapeutic Activity: 23-37 mins        Tresa Endo PT Acute Rehabilitation Services Pager [phone] Office [phone]   Claretha Cooper 04/18/2018, 2:05 PM

## 2018-04-18 NOTE — Progress Notes (Signed)
PROGRESS NOTE  Elizabeth [name] WJX:[id number] DOB: Nov 12, 1950 DOA: 04/09/2018 PCP: Ann Held, DO  Brief History:  67 y/o female with osteoarthritis of the left hip who underwent an injection of the same on 11/11 (per IR) developed left hip pain on 11/14.  She presented to [facility] in septic shock with worsening left hip pain on 12/9 and was admitted to [facility].  Blood cultures grew staph lugdunesis in 4/4 bottles.     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 3+ mitral regurgitation without vegetation noted. -off pressor, critical care signed off. Transferred to hospitalist service on 12/16, she remained on stress dose steroids/midodrine for bp support. Improving, taper steroids and midodrine as tolerated. -ID/ortho/cardiology on border - repeat blood culture no growth -TEE on 12/16 - IRRIGATION AND DEBRIDEMENT LEFT HIP (ANTERIOR) (Left) with drain placement pn 12/16 By Dr Wynelle Link -per cardiology, will need to finish abx treatment before consider cardiac cath and  CT surgery, to be determined by cardiology  Thrombocytopenia:  Likely from infection Seems nadir at 64 Monitor, no sign of acute bleed  Recurrent SVT/atrial tachycardia - off amiodarone drip, started on oral amiodarone on 12/17, cardiology  following  Volume overload: Prn lasix if bp and cr allows Currently still on stress dose steroids and midodrine  Anemia on chronic disease: hgb baseline around 9  HTN:  admitted with septic shock  Home bp meds benicar /HCTZ  held  Currently still on stress dose steroids and midodrine for bp support, need to taper off.  H/o lung cancer resected 2010  Morbid obesity: Body mass index is 42.19 kg/m.   Code Status: full  Family Communication: patient and daughter  Disposition Plan: remain in stepdown, need snf placement, patient agrees to it. Need cardiology/ortho and ID clearance   Consultants:  Admitted to critical care on 12/8, transferred to hospitalist service on 12/16  ID  Cardiology  ortho  Procedures:  TEE on 12/15  Left hip washout with drain placement on 12/15  Antibiotics:  Per ID   Objective: BP (!) 117/42   Pulse 63   Temp (!) 97.5 F (36.4 C) (Axillary)   Resp 20   Ht 5\' 4"  (1.626 m)   Wt 111.5 kg   SpO2 98%   BMI 42.19 kg/m   Intake/Output Summary (Last 24 hours) at 04/18/2018 1257 Last data filed at 04/18/2018 1100 Gross per 24 hour  Intake 1361.13 ml  Output 610 ml  Net 751.13 ml   Filed Weights   04/17/18 0500 04/17/18 1159 04/18/18 0500  Weight: 124.4 kg 124.4 kg 111.5 kg    Exam: Patient is examined daily including today on 04/18/2018, exams remain the same as of yesterday except that has changed    General:  Weak, frail, NAD  Cardiovascular: RRR  Respiratory: diminished at basis, no wheezing, no  rales, no rhonchi  Abdomen: Soft/ND/NT, positive BS  Musculoskeletal: bilateral lower extremity pitting Edema, left hip post op changes , dressing in place, drain in place with serosanguinous fluids  Neuro: alert and interactive  Data Reviewed: Basic Metabolic Panel: Recent Labs  Lab 04/12/18 1228 04/13/18 0107 04/14/18 0632 04/15/18 0500 04/16/18 0230 04/16/18 0403 04/18/18 0851  NA  --  137 139 137 137 137  136  K  --  4.2 4.0 4.4 4.6 4.6 4.0  CL  --  106 108 103 103 104 103  CO2  --  22 25 27 27 27 26   GLUCOSE  --  178* 149* 145* 156* 150* 190*  BUN  --  22 25* 29* 34* 32* 36*  CREATININE  --  0.81 0.77 0.90 0.93 0.93 0.95  CALCIUM  --  8.0* 7.2* 7.8* 8.1* 7.9* 7.9*  MG 1.8 1.9  --   --   --   --  2.4   Liver Function Tests: Recent Labs  Lab 04/11/18 2223  AST 25  ALT 13  ALKPHOS 53  BILITOT 1.3*  PROT 5.7*  ALBUMIN 2.2*   No results for input(s): LIPASE, AMYLASE in the last 168 hours. No results for input(s): AMMONIA in the last 168 hours. CBC: Recent Labs  Lab 04/14/18 0500 04/14/18 0632 04/15/18 0500 04/16/18 0403 04/18/18 0851  WBC QUESTIONABLE RESULTS, RECOMMEND RECOLLECT TO VERIFY 22.3* 22.5* 17.8* 15.1*  NEUTROABS QUESTIONABLE RESULTS, RECOMMEND RECOLLECT TO VERIFY 19.2* 18.6* 14.6*  --   HGB QUESTIONABLE RESULTS, RECOMMEND RECOLLECT TO VERIFY 8.6* 9.5* 9.3* 8.9*  HCT QUESTIONABLE RESULTS, RECOMMEND RECOLLECT TO VERIFY 28.2* 30.5* 30.0* 29.5*  MCV QUESTIONABLE RESULTS, RECOMMEND RECOLLECT TO VERIFY 96.6 95.0 97.7 99.7  PLT QUESTIONABLE RESULTS, RECOMMEND RECOLLECT TO VERIFY 84* 76* 64* 69*   Cardiac Enzymes:   Recent Labs  Lab 04/12/18 1300 04/13/18 0107 04/16/18 2320 04/17/18 0503 04/17/18 1105  TROPONINI 0.08* 0.07* 0.05* 0.05* 0.06*   BNP (last 3 results) No results for input(s): BNP in the last 8760 hours.  ProBNP (last 3 results) No results for input(s): PROBNP in the last 8760 hours.  CBG: Recent Labs  Lab 04/12/18 1201  GLUCAP 188*    Recent Results (from the past 240 hour(s))  Culture, blood (routine x 2)     Status: Abnormal   Collection Time: 04/09/18  4:01 PM  Result Value Ref Range Status   Specimen Description   Final    BLOOD RIGHT ANTECUBITAL Performed at [facility], [place] [address] [place], [place] [address]    Special Requests   Final    BOTTLES DRAWN AEROBIC AND ANAEROBIC Blood Culture adequate volume Performed at  [place] [address], [place], [place] [address]    Culture  Setup Time   Final    GRAM POSITIVE COCCI IN CLUSTERS IN BOTH AEROBIC AND ANAEROBIC BOTTLES CRITICAL RESULT CALLED TO, READ BACK BY AND VERIFIED WITH: PHAM ANH PHARMD AT 1000 ON [id number]  BY SJW    Culture (A)  Final    STAPHYLOCOCCUS LUGDUNENSIS SUSCEPTIBILITIES PERFORMED ON PREVIOUS CULTURE WITHIN THE LAST 5 DAYS. Performed at [facility], [place] [address] [place], [place] [address]    Report Status 04/13/2018 FINAL  Final  Blood Culture ID Panel (Reflexed)     Status: Abnormal   Collection Time: 04/09/18  4:01 PM  Result Value Ref Range Status   Enterococcus species NOT DETECTED NOT DETECTED Final   Listeria monocytogenes NOT DETECTED  NOT DETECTED Final   Staphylococcus species DETECTED (A) NOT DETECTED Final    Comment: Methicillin (oxacillin) susceptible coagulase negative staphylococcus. Possible blood culture contaminant (unless isolated from more than one blood culture draw or clinical case suggests pathogenicity). No antibiotic treatment is indicated for blood  culture contaminants. CRITICAL RESULT CALLED TO, READ BACK BY AND VERIFIED WITH: PHAM ANH PHARMD AT 1000 ON [id number] BY SJW    Staphylococcus aureus (BCID) NOT DETECTED NOT DETECTED Final   Methicillin resistance NOT DETECTED NOT DETECTED Final   Streptococcus species NOT DETECTED NOT DETECTED Final   Streptococcus agalactiae NOT DETECTED NOT DETECTED Final   Streptococcus pneumoniae NOT DETECTED NOT DETECTED Final   Streptococcus pyogenes NOT DETECTED NOT DETECTED Final   Acinetobacter baumannii NOT DETECTED NOT DETECTED Final   Enterobacteriaceae species NOT DETECTED NOT DETECTED Final   Enterobacter cloacae complex NOT DETECTED NOT DETECTED Final   Escherichia coli NOT DETECTED NOT DETECTED Final   Klebsiella oxytoca NOT DETECTED NOT DETECTED Final   Klebsiella pneumoniae NOT DETECTED NOT DETECTED Final   Proteus species  NOT DETECTED NOT DETECTED Final   Serratia marcescens NOT DETECTED NOT DETECTED Final   Haemophilus influenzae NOT DETECTED NOT DETECTED Final   Neisseria meningitidis NOT DETECTED NOT DETECTED Final   Pseudomonas aeruginosa NOT DETECTED NOT DETECTED Final   Candida albicans NOT DETECTED NOT DETECTED Final   Candida glabrata NOT DETECTED NOT DETECTED Final   Candida krusei NOT DETECTED NOT DETECTED Final   Candida parapsilosis NOT DETECTED NOT DETECTED Final   Candida tropicalis NOT DETECTED NOT DETECTED Final    Comment: Performed at [facility], [address], [place], [place] [address]  Culture, blood (routine x 2)     Status: Abnormal   Collection Time: 04/09/18  4:06 PM  Result Value Ref Range Status   Specimen Description   Final    BLOOD LEFT ANTECUBITAL Performed at [place] [address], [place], [place] [address]    Special Requests   Final    BOTTLES DRAWN AEROBIC AND ANAEROBIC Blood Culture adequate volume Performed at [place] [address] [place] [address], [place], [place] [address]    Culture  Setup Time   Final    GRAM POSITIVE COCCI IN CLUSTERS IN BOTH AEROBIC AND ANAEROBIC BOTTLES CRITICAL VALUE NOTED.  VALUE IS CONSISTENT WITH PREVIOUSLY REPORTED AND CALLED VALUE. Performed at [facility], [place] [address] [place] [address], [place],  [address]    Culture STAPHYLOCOCCUS LUGDUNENSIS (A)  Final   Report Status 04/13/2018 FINAL  Final   Organism ID, Bacteria STAPHYLOCOCCUS LUGDUNENSIS  Final      Susceptibility   Staphylococcus lugdunensis - MIC*    CIPROFLOXACIN <=0.5 SENSITIVE Sensitive     ERYTHROMYCIN >=8 RESISTANT Resistant     GENTAMICIN <=0.5 SENSITIVE Sensitive     OXACILLIN 0.5 SENSITIVE Sensitive     TETRACYCLINE <=1 SENSITIVE Sensitive     VANCOMYCIN <=0.5 SENSITIVE Sensitive     TRIMETH/SULFA <=10 SENSITIVE Sensitive     CLINDAMYCIN RESISTANT Resistant     RIFAMPIN <=0.5 SENSITIVE Sensitive     Inducible  Clindamycin POSITIVE Resistant     * STAPHYLOCOCCUS LUGDUNENSIS  MRSA PCR Screening     Status: None   Collection Time: 04/09/18 10:41 PM  Result Value Ref Range Status   MRSA by PCR NEGATIVE NEGATIVE Final    Comment:        The GeneXpert MRSA Assay (FDA approved for NASAL specimens only), is  one component of a comprehensive MRSA colonization surveillance program. It is not intended to diagnose MRSA infection nor to guide or monitor treatment for MRSA infections. Performed at [facility], [place] [address] [place], [place] [address]   Body fluid culture     Status: None   Collection Time: 04/10/18  5:15 PM  Result Value Ref Range Status   Specimen Description   Final    SYNOVIAL Performed at [place] [address], [place], [place] [address]    Special Requests   Final    HIP Performed at [facility], [address], [place], [place] [address]    Gram Stain   Final    RARE WBC PRESENT,BOTH PMN AND MONONUCLEAR NO ORGANISMS SEEN    Culture   Final    RARE STAPHYLOCOCCUS LUGDUNENSIS CRITICAL RESULT CALLED TO, READ BACK BY AND VERIFIED WITH: RN Jerre Simon [id number] AT 58 AM BY CN Performed at [facility], [place] [address] [place], [place] [address]    Report Status 04/14/2018 FINAL  Final   Organism ID, Bacteria STAPHYLOCOCCUS LUGDUNENSIS  Final      Susceptibility   Staphylococcus lugdunensis - MIC*    CIPROFLOXACIN <=0.5 SENSITIVE Sensitive     ERYTHROMYCIN >=8 RESISTANT Resistant     GENTAMICIN <=0.5 SENSITIVE Sensitive     OXACILLIN 0.5 SENSITIVE Sensitive     TETRACYCLINE <=1 SENSITIVE Sensitive     VANCOMYCIN <=0.5 SENSITIVE Sensitive     TRIMETH/SULFA <=10 SENSITIVE Sensitive     CLINDAMYCIN RESISTANT Resistant     RIFAMPIN <=0.5 SENSITIVE Sensitive     Inducible Clindamycin POSITIVE Resistant     * RARE STAPHYLOCOCCUS LUGDUNENSIS  Culture, blood (Routine X 2) w Reflex to ID Panel     Status: None  (Preliminary result)   Collection Time: 04/11/18  4:38 PM  Result Value Ref Range Status   Specimen Description   Final    BLOOD LEFT HAND Performed at [place] [address], [place], [place] [address]    Special Requests   Final    BOTTLES DRAWN AEROBIC AND ANAEROBIC Blood Culture adequate volume Performed at [place] [address], [place], [place] [address]    Culture   Final    NO GROWTH 4 DAYS Performed at [facility], [place] [address], [place], [place] [address]    Report Status PENDING  Incomplete  Culture, blood (Routine X 2) w Reflex to ID Panel     Status: None (Preliminary result)   Collection Time: 04/11/18  4:45 PM  Result Value Ref Range Status   Specimen Description   Final    BLOOD RIGHT HAND Performed at [place] [address] [place], [place] [address]    Special Requests   Final    BOTTLES DRAWN AEROBIC AND ANAEROBIC Blood Culture adequate volume Performed at [place] [address] [place],  [address]    Culture   Final    NO GROWTH 4 DAYS Performed at [facility], [place] [address], [place],  [address]    Report Status PENDING  Incomplete  Aerobic/Anaerobic Culture (surgical/deep wound)     Status: None (Preliminary result)   Collection Time: 04/17/18  7:52 PM  Result Value Ref Range Status   Specimen Description   Final    SYNOVIAL LEFT HIP Performed at [place] [address] [place],  [place] [address]    Special Requests   Final    NONE Performed at [facility], [place] [address], [place], [place] [address]    Gram Stain   Final    FEW WBC PRESENT, PREDOMINANTLY PMN NO ORGANISMS SEEN    Culture   Final    NO GROWTH < 12 HOURS Performed at [facility] [address] [place], [place] [address]    Report Status PENDING  Incomplete     Studies: No results  found.  Scheduled Meds: . amiodarone  400 mg Oral BID  . aspirin  81 mg Oral Daily  . Chlorhexidine Gluconate Cloth  6 each Topical Daily  . docusate sodium  100 mg Oral BID  . enoxaparin (LOVENOX) injection  40 mg Subcutaneous Q24H  . feeding supplement (PRO-STAT SUGAR FREE 64)  30 mL Oral BID  . hydrocortisone sodium succinate  50 mg Intravenous Q12H  . mouth rinse  15 mL Mouth Rinse BID  . metoprolol tartrate  2.5 mg Intravenous Q6H  . midodrine  10 mg Oral TID WC  . multivitamin with minerals  1 tablet Oral Daily  . sodium chloride flush  10-40 mL Intracatheter Q12H    Continuous Infusions: .  ceFAZolin (ANCEF) IV Stopped (04/18/18 3267)     Time spent: 34mins I have personally reviewed and interpreted on  04/18/2018 daily labs, tele strips, imagings as discussed above under date review session and assessment and plans.  I reviewed all nursing notes, pharmacy notes, consultant notes,  vitals, pertinent old records  I have discussed plan of care as described above with RN , patient on 04/18/2018   Florencia Reasons MD, PhD  [facility] Pager [phone]. If 7PM-7AM, please contact night-coverage at [email or web address], password [facility] 04/18/2018, 12:57 PM  LOS: 9 days

## 2018-04-19 DIAGNOSIS — I38 Endocarditis, valve unspecified: Secondary | ICD-10-CM

## 2018-04-19 DIAGNOSIS — I351 Nonrheumatic aortic (valve) insufficiency: Secondary | ICD-10-CM

## 2018-04-19 LAB — CBC WITH DIFFERENTIAL/PLATELET
Abs Immature Granulocytes: 0.5 10*3/uL — ABNORMAL HIGH (ref 0.00–0.07)
Basophils Absolute: 0 10*3/uL (ref 0.0–0.1)
Basophils Relative: 0 %
Eosinophils Absolute: 0 10*3/uL (ref 0.0–0.5)
Eosinophils Relative: 0 %
HCT: 27 % — ABNORMAL LOW (ref 36.0–46.0)
Hemoglobin: 8.1 g/dL — ABNORMAL LOW (ref 12.0–15.0)
Immature Granulocytes: 4 %
Lymphocytes Relative: 13 %
Lymphs Abs: 1.7 10*3/uL (ref 0.7–4.0)
MCH: 30.2 pg (ref 26.0–34.0)
MCHC: 30 g/dL (ref 30.0–36.0)
MCV: 100.7 fL — ABNORMAL HIGH (ref 80.0–100.0)
Monocytes Absolute: 0.5 10*3/uL (ref 0.1–1.0)
Monocytes Relative: 4 %
NRBC: 0.3 % — AB (ref 0.0–0.2)
Neutro Abs: 10.5 10*3/uL — ABNORMAL HIGH (ref 1.7–7.7)
Neutrophils Relative %: 79 %
Platelets: 71 10*3/uL — ABNORMAL LOW (ref 150–400)
RBC: 2.68 MIL/uL — ABNORMAL LOW (ref 3.87–5.11)
RDW: 17.4 % — ABNORMAL HIGH (ref 11.5–15.5)
WBC: 13.2 10*3/uL — AB (ref 4.0–10.5)

## 2018-04-19 LAB — BASIC METABOLIC PANEL
Anion gap: 6 (ref 5–15)
BUN: 30 mg/dL — ABNORMAL HIGH (ref 8–23)
CALCIUM: 7.9 mg/dL — AB (ref 8.9–10.3)
CHLORIDE: 104 mmol/L (ref 98–111)
CO2: 28 mmol/L (ref 22–32)
CREATININE: 0.78 mg/dL (ref 0.44–1.00)
GFR calc Af Amer: >60 mL/min (ref >60)
GFR calc non Af Amer: >60 mL/min (ref >60)
Glucose, Bld: 121 mg/dL — ABNORMAL HIGH (ref 70–99)
Potassium: 3.9 mmol/L (ref 3.5–5.1)
Sodium: 138 mmol/L (ref 135–145)

## 2018-04-19 MED ORDER — HYDROCORTISONE NA SUCCINATE PF 100 MG IJ SOLR: 50.0000 mg | Freq: Every day | INTRAMUSCULAR | Status: DC

## 2018-04-19 MED ORDER — FUROSEMIDE 10 MG/ML IJ SOLN
40.0000 mg | Freq: Once | INTRAMUSCULAR | Status: AC
  Administered 2018-04-19: 40 mg via INTRAVENOUS
  Filled 2018-04-19: qty 4

## 2018-04-19 MED ORDER — APIXABAN 5 MG PO TABS
5.0000 mg | ORAL_TABLET | Freq: Two times a day (BID) | ORAL | Status: DC
  Administered 2018-04-19: 5 mg via ORAL
  Filled 2018-04-19: qty 1

## 2018-04-19 NOTE — Progress Notes (Addendum)
PROGRESS NOTE    Elizabeth [name]  ACZ:[id number] DOB: Apr 23, 1951 DOA: 04/09/2018 PCP: Ann Held, DO   Brief Narrative:  67 year old with past medical history significant for osteoarthritis of the left hip who underwent an a steroid injection on the same on 11/11 (per IR) who developed left hip pain on 11/14.  Patient was admitted to [facility] long hospital on 12/09 with septic shock, found to have bacteremia with a staph lugdunesis.  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  Cardiology and ID has been helping with patient's care.  Patient also found to have left hip septic arthritis, she underwent irrigation and debridement by Dr. Maureen Ralphs  on 04/18/2018.  Patient also hospital course complicated by SVT, now on amiodarone.  Volume overload, persistent hypotension on midodrine.     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 -Off IV pressor.  CCM sign off. -On [facility] service since 12/16. -Continue on Midodrine 3 times daily. -TEE ; aortic valve endocarditis with severe eccentric AI and perforation. -Need to finish IV antibiotics before heart cath and CT surgery evaluation. -We will likely need 6 to 8 weeks of IV antibiotics.  Follow ID recommendation for length of treatment. -White blood cell trending down Left hip  septic arthritis, -Status post left hip irrigation and debridement and drain placement on 12/16.. -Drain removed 12/18.   Thrombocytopenia; the related to acute infection process. Stable.  Recurrent SVT/atrial tachycardia: She was initially on amiodarone drip.  Transition to oral amiodarone on 12/17 Cardiology following..  Acute diastolic heart failure exacerbation Patient with pulmonary edema, volume overload. Discussed with PA from cardiology, they will assess need for IV Lasix.  Anemia of chronic disease; Hemoglobin baseline around 9. Check anemia panel.  Hypertension; continue to hold Benicar and hydrochlorothiazide Patient currently on a steroid stress dose and midodrine.   H/O lung cancer resected 2010.  Morbid Obesity.     RN Pressure Injury Documentation: Pressure Injury 04/09/18 Stage II -  Partial thickness loss of dermis presenting as a shallow open ulcer with a red, pink wound bed without slough. (Active)  04/09/18 2230  Location: Sacrum  Location Orientation: Right  Staging: Stage II -  Partial thickness loss of dermis presenting as a shallow open ulcer with a red, pink wound bed without slough.  Wound Description (Comments):   Present on Admission: Yes     Pressure Injury 04/09/18 Stage II -  Partial thickness loss of dermis presenting as a shallow open ulcer with a red, pink wound bed without slough. (Active)  04/09/18 2230  Location: Sacrum  Location Orientation: Left  Staging: Stage II -  Partial thickness loss of dermis presenting as a shallow open ulcer with a red, pink wound bed without slough.  Wound Description (Comments):   Present on Admission: Yes    Malnutrition Type:  Nutrition Problem:  Increased nutrient needs Etiology: acute illness, wound healing   Malnutrition Characteristics:  Signs/Symptoms: estimated needs   Nutrition Interventions:  Interventions: Prostat, MVI  Estimated body mass index is 47.15 kg/m as calculated from  the following:   Height as of this encounter: 5\' 4"  (1.626 m).   Weight as of this encounter: 124.6 kg.   DVT prophylaxis: Eliquis Code Status: Full code Family Communication: Care discussed with patient Disposition Plan: She will need a skilled nursing facility, social worker consulted.   Consultants:   Cardiology  ID  CCM sign off  Orthopedic, Dr Elmyra Ricks   Procedures:   TEE on 12/15  Left hip washout with drain placement on 12/15   Antimicrobials:   Ancef 12-09   Subjective: She reports shortness of breath, improving.  Not breathing at baseline. She just had the left drain removed this morning from hip.   Objective: Vitals:   04/19/18 0400 04/19/18 0459 04/19/18 0600 04/19/18 0800  BP: (!) 110/47  (!) 108/42 (!) 115/41  Pulse: (!) 57  (!) 58 65  Resp: 15  15 (!) 21  Temp: 98.4 F (36.9 C)   98.1 F (36.7 C)  TempSrc: Oral   Oral  SpO2: 100%  100% 100%  Weight:  124.6 kg    Height:        Intake/Output Summary (Last 24 hours) at 04/19/2018 0820 Last data filed at 04/19/2018 0800 Gross per 24 hour  Intake 959.29 ml  Output 690 ml  Net 269.29 ml   Filed Weights   04/17/18 1159 04/18/18 0500 04/19/18 0459  Weight: 124.4 kg 111.5 kg 124.6 kg    Examination:  General exam: Appears calm and comfortable  Respiratory system: Bilateral crackles Cardiovascular system: S1 & S2 heard, RRR.  Systolic murmur murmurs, rubs, gallops or clicks. Gastrointestinal system: Abdomen is nondistended, soft and nontender. No organomegaly or masses felt. Normal bowel sounds heard. Central nervous system: Alert and oriented. No focal neurological deficits. Extremities: Symmetric 5 x 5 power. Skin: Left hip with clean dressing on Psychiatry: Judgement and insight appear normal. Mood & affect appropriate.     Data Reviewed: I have personally reviewed following labs and imaging studies  CBC: Recent Labs  Lab 04/14/18 0500 04/14/18 0632 04/15/18 0500  04/16/18 0403 04/18/18 0851 04/19/18 0500  WBC QUESTIONABLE RESULTS, RECOMMEND RECOLLECT TO VERIFY 22.3* 22.5* 17.8* 15.1* 13.2*  NEUTROABS QUESTIONABLE RESULTS, RECOMMEND RECOLLECT TO VERIFY 19.2* 18.6* 14.6*  --  10.5*  HGB QUESTIONABLE RESULTS, RECOMMEND RECOLLECT TO VERIFY 8.6* 9.5* 9.3* 8.9* 8.1*  HCT QUESTIONABLE RESULTS, RECOMMEND RECOLLECT TO VERIFY 28.2* 30.5* 30.0* 29.5* 27.0*  MCV QUESTIONABLE RESULTS, RECOMMEND RECOLLECT TO VERIFY 96.6 95.0 97.7 99.7 100.7*  PLT QUESTIONABLE RESULTS, RECOMMEND RECOLLECT TO VERIFY 84* 76* 64* 69* 71*   Basic Metabolic Panel: Recent Labs  Lab 04/12/18 1228 04/13/18 0107  04/15/18 0500 04/16/18 0230 04/16/18 0403 04/18/18 0851 04/19/18 0500  NA  --  137   < > 137 137 137 136 138  K  --  4.2   < > 4.4 4.6 4.6 4.0 3.9  CL  --  106   < > 103 103 104 103 104  CO2  --  22   < > 27 27 27 26 28   GLUCOSE  --  178*   < > 145* 156* 150* 190* 121*  BUN  --  22   < > 29* 34* 32* 36* 30*  CREATININE  --  0.81   < > 0.90 0.93 0.93  0.95 0.78  CALCIUM  --  8.0*   < > 7.8* 8.1* 7.9* 7.9* 7.9*  MG 1.8 1.9  --   --   --   --  2.4  --    < > = values in this interval not displayed.   GFR: Estimated Creatinine Clearance: 87.9 mL/min (by C-G formula based on SCr of 0.78 mg/dL). Liver Function Tests: No results for input(s): AST, ALT, ALKPHOS, BILITOT, PROT, ALBUMIN in the last 168 hours. No results for input(s): LIPASE, AMYLASE in the last 168 hours. No results for input(s): AMMONIA in the last 168 hours. Coagulation Profile: No results for input(s): INR, PROTIME in the last 168 hours. Cardiac Enzymes: Recent Labs  Lab 04/12/18 1300 04/13/18 0107 04/16/18 2320 04/17/18 0503 04/17/18 1105  TROPONINI 0.08* 0.07* 0.05* 0.05* 0.06*   BNP (last 3 results) No results for input(s): PROBNP in the last 8760 hours. HbA1C: No results for input(s): HGBA1C in the last 72 hours. CBG: Recent Labs  Lab 04/12/18 1201  GLUCAP 188*   Lipid Profile: No  results for input(s): CHOL, HDL, LDLCALC, TRIG, CHOLHDL, LDLDIRECT in the last 72 hours. Thyroid Function Tests: No results for input(s): TSH, T4TOTAL, FREET4, T3FREE, THYROIDAB in the last 72 hours. Anemia Panel: No results for input(s): VITAMINB12, FOLATE, FERRITIN, TIBC, IRON, RETICCTPCT in the last 72 hours. Sepsis Labs: Recent Labs  Lab 04/14/18 1015 04/15/18 0500 04/16/18 0403  PROCALCITON 0.24 0.18 0.13    Recent Results (from the past 240 hour(s))  Culture, blood (routine x 2)     Status: Abnormal   Collection Time: 04/09/18  4:01 PM  Result Value Ref Range Status   Specimen Description   Final    BLOOD RIGHT ANTECUBITAL Performed at [facility], [place] [address] [place], [place] [address]    Special Requests   Final    BOTTLES DRAWN AEROBIC AND ANAEROBIC Blood Culture adequate volume Performed at [facility] [address] [employer] [place], [place] [address]    Culture  Setup Time   Final    GRAM POSITIVE COCCI IN CLUSTERS IN BOTH AEROBIC AND ANAEROBIC BOTTLES CRITICAL RESULT CALLED TO, READ BACK BY AND VERIFIED WITH: PHAM ANH PHARMD AT 1000 ON [id number]  BY SJW    Culture (A)  Final    STAPHYLOCOCCUS LUGDUNENSIS SUSCEPTIBILITIES PERFORMED ON PREVIOUS CULTURE WITHIN THE LAST 5 DAYS. Performed at [facility], [place] [address], [place], [place] [address]    Report Status 04/13/2018 FINAL  Final  Blood Culture ID Panel (Reflexed)     Status: Abnormal   Collection Time: 04/09/18  4:01 PM  Result Value Ref Range Status   Enterococcus species NOT DETECTED NOT DETECTED Final   Listeria monocytogenes NOT DETECTED NOT DETECTED Final   Staphylococcus species DETECTED (A) NOT DETECTED Final    Comment: Methicillin (oxacillin) susceptible coagulase negative staphylococcus. Possible blood culture contaminant (unless isolated from more than one blood culture draw or clinical case suggests pathogenicity). No antibiotic treatment is indicated for blood   culture contaminants. CRITICAL RESULT CALLED TO, READ BACK BY AND VERIFIED WITH: PHAM ANH PHARMD AT 1000 ON [id number] BY SJW    Staphylococcus aureus (BCID) NOT DETECTED NOT DETECTED Final   Methicillin resistance NOT DETECTED NOT DETECTED Final   Streptococcus species NOT DETECTED NOT DETECTED Final   Streptococcus agalactiae NOT DETECTED NOT DETECTED Final   Streptococcus pneumoniae NOT DETECTED NOT DETECTED Final   Streptococcus pyogenes NOT DETECTED NOT DETECTED Final  Acinetobacter baumannii NOT DETECTED NOT DETECTED Final   Enterobacteriaceae species NOT DETECTED NOT DETECTED Final   Enterobacter cloacae complex NOT DETECTED NOT DETECTED Final   Escherichia coli NOT DETECTED NOT DETECTED Final   Klebsiella oxytoca NOT DETECTED NOT DETECTED Final   Klebsiella pneumoniae NOT DETECTED NOT DETECTED Final   Proteus species NOT DETECTED NOT DETECTED Final   Serratia marcescens NOT DETECTED NOT DETECTED Final   Haemophilus influenzae NOT DETECTED NOT DETECTED Final   Neisseria meningitidis NOT DETECTED NOT DETECTED Final   Pseudomonas aeruginosa NOT DETECTED NOT DETECTED Final   Candida albicans NOT DETECTED NOT DETECTED Final   Candida glabrata NOT DETECTED NOT DETECTED Final   Candida krusei NOT DETECTED NOT DETECTED Final   Candida parapsilosis NOT DETECTED NOT DETECTED Final   Candida tropicalis NOT DETECTED NOT DETECTED Final    Comment: Performed at [facility], [place] [address], [place], [place] [address]  Culture, blood (routine x 2)     Status: Abnormal   Collection Time: 04/09/18  4:06 PM  Result Value Ref Range Status   Specimen Description   Final    BLOOD LEFT ANTECUBITAL Performed at [facility] [address] [place], [place] [address]    Special Requests   Final    BOTTLES DRAWN AEROBIC AND ANAEROBIC Blood Culture adequate volume Performed at [facility] [address] [place], [place] [address]    Culture  Setup  Time   Final    GRAM POSITIVE COCCI IN CLUSTERS IN BOTH AEROBIC AND ANAEROBIC BOTTLES CRITICAL VALUE NOTED.  VALUE IS CONSISTENT WITH PREVIOUSLY REPORTED AND CALLED VALUE. Performed at [facility], [place] [address], [place], [place] [address]    Culture STAPHYLOCOCCUS LUGDUNENSIS (A)  Final   Report Status 04/13/2018 FINAL  Final   Organism ID, Bacteria STAPHYLOCOCCUS LUGDUNENSIS  Final      Susceptibility   Staphylococcus lugdunensis - MIC*    CIPROFLOXACIN <=0.5 SENSITIVE Sensitive     ERYTHROMYCIN >=8 RESISTANT Resistant     GENTAMICIN <=0.5 SENSITIVE Sensitive     OXACILLIN 0.5 SENSITIVE Sensitive     TETRACYCLINE <=1 SENSITIVE Sensitive     VANCOMYCIN <=0.5 SENSITIVE Sensitive     TRIMETH/SULFA <=10 SENSITIVE Sensitive     CLINDAMYCIN RESISTANT Resistant     RIFAMPIN <=0.5 SENSITIVE Sensitive     Inducible Clindamycin POSITIVE Resistant     * STAPHYLOCOCCUS LUGDUNENSIS  MRSA PCR Screening     Status: None   Collection Time: 04/09/18 10:41 PM  Result Value Ref Range Status   MRSA by PCR NEGATIVE NEGATIVE Final    Comment:        The GeneXpert MRSA Assay (FDA approved for NASAL specimens only), is one component of a comprehensive MRSA colonization surveillance program. It is not intended to diagnose MRSA infection nor to guide or monitor treatment for MRSA infections. Performed at [facility], [place] [address], [place], [place] [address]   Body fluid culture     Status: None   Collection Time: 04/10/18  5:15 PM  Result Value Ref Range Status   Specimen Description   Final    SYNOVIAL Performed at [facility] [address], [place],  [address]    Special Requests   Final    HIP Performed at [facility], [place] [address] [place], [place] [address]    Gram Stain   Final    RARE WBC PRESENT,BOTH PMN  AND MONONUCLEAR NO ORGANISMS SEEN    Culture   Final    RARE STAPHYLOCOCCUS  LUGDUNENSIS CRITICAL RESULT CALLED TO, READ BACK BY AND VERIFIED WITH: RN Jerre Simon [id number] AT 84 AM BY CN Performed at [facility], [place] [address], [place], [place] [address]    Report Status 04/14/2018 FINAL  Final   Organism ID, Bacteria STAPHYLOCOCCUS LUGDUNENSIS  Final      Susceptibility   Staphylococcus lugdunensis - MIC*    CIPROFLOXACIN <=0.5 SENSITIVE Sensitive     ERYTHROMYCIN >=8 RESISTANT Resistant     GENTAMICIN <=0.5 SENSITIVE Sensitive     OXACILLIN 0.5 SENSITIVE Sensitive     TETRACYCLINE <=1 SENSITIVE Sensitive     VANCOMYCIN <=0.5 SENSITIVE Sensitive     TRIMETH/SULFA <=10 SENSITIVE Sensitive     CLINDAMYCIN RESISTANT Resistant     RIFAMPIN <=0.5 SENSITIVE Sensitive     Inducible Clindamycin POSITIVE Resistant     * RARE STAPHYLOCOCCUS LUGDUNENSIS  Culture, blood (Routine X 2) w Reflex to ID Panel     Status: None   Collection Time: 04/11/18  4:38 PM  Result Value Ref Range Status   Specimen Description   Final    BLOOD LEFT HAND Performed at [facility] [address], [place], [place] [address]    Special Requests   Final    BOTTLES DRAWN AEROBIC AND ANAEROBIC Blood Culture adequate volume Performed at [facility] [address], [place], [place] [address]    Culture   Final    NO GROWTH 5 DAYS Performed at [facility], [place] [address], [place], [place] [address]    Report Status 04/18/2018 FINAL  Final  Culture, blood (Routine X 2) w Reflex to ID Panel     Status: None   Collection Time: 04/11/18  4:45 PM  Result Value Ref Range Status   Specimen Description   Final    BLOOD RIGHT HAND Performed at [facility] [address] [place], [place] [address]    Special Requests   Final    BOTTLES DRAWN AEROBIC AND ANAEROBIC Blood Culture adequate volume Performed at [facility] [address], [place], [place] [address]    Culture   Final    NO GROWTH 5  DAYS Performed at [facility], [place] [address], [place], [place] [address]    Report Status 04/18/2018 FINAL  Final  Aerobic/Anaerobic Culture (surgical/deep wound)     Status: None (Preliminary result)   Collection Time: 04/17/18  7:52 PM  Result Value Ref Range Status   Specimen Description   Final    SYNOVIAL LEFT HIP Performed at [facility] [address], [place], [place] [address]    Special Requests   Final    NONE Performed at [facility], [place] [address], [place], [place] [address]    Gram Stain   Final    FEW WBC PRESENT, PREDOMINANTLY PMN NO ORGANISMS SEEN    Culture   Final    NO GROWTH < 12 HOURS Performed at [facility] [address] [place],  [address]    Report Status PENDING  Incomplete         Radiology Studies: No results found.      Scheduled Meds: . amiodarone  400 mg Oral BID  . aspirin  81 mg Oral Daily  . Chlorhexidine Gluconate Cloth  6 each Topical Daily  .  docusate sodium  100 mg Oral BID  . enoxaparin (LOVENOX) injection  40 mg Subcutaneous Q24H  . feeding supplement (PRO-STAT SUGAR FREE 64)  30 mL Oral BID  . [START ON 04/20/2018] hydrocortisone sodium succinate  50 mg Intravenous Daily  . mouth rinse  15 mL Mouth Rinse BID  . midodrine  10 mg Oral TID WC  . multivitamin with minerals  1 tablet Oral Daily  . sodium chloride flush  10-40 mL Intracatheter Q12H   Continuous Infusions: .  ceFAZolin (ANCEF) IV Stopped (04/19/18 0536)     LOS: 10 days    Time spent: 35 minutes     Elmarie Shiley, MD [facility] Pager [phone]  If 7PM-7AM, please contact night-coverage [email or web address] Password [email or web address] 04/19/2018, 8:20 AM

## 2018-04-19 NOTE — Progress Notes (Signed)
    [facility] for Infectious Disease   Reason for visit: Follow up on endocarditis  Interval History: no acute changes, continues on cefazolin  Physical Exam: Constitutional:  Vitals:   04/19/18 1200 04/19/18 1334  BP: (!) 88/40 120/65  Pulse: 63 72  Resp: 16 19  Temp: 97.8 F (36.6 C)   SpO2: 99% 97%   patient appears in NAD  Impression: IE, on appropriate therapy.    Plan: 1.  No changes Will continue to follow

## 2018-04-19 NOTE — Evaluation (Signed)
Occupational Therapy Evaluation Patient Details Name: Elizabeth [name] MRN: [id number] DOB: [birth year] Today's Date: 04/19/2018    History of Present Illness 67 yo female s/p R TKA 8/19, admitted 12/16 with septic arthritis of left hip, s/P I and D on 04/17/18.TEE 12/16.   Clinical Impression   Pt is s/p I &D of L hip and she had a R TKA earlier this year. Pt was having more pain from knee than hip at time of evaluation.  She had some assistance with adls prior to admission and had finished OPPT.  Pt was moving around/toileting herself without help.  Pt participated in OT with encouragement and did much better than her first time up.  Will follow with min A level goals in acute setting. Recommend SNF for rehab    Follow Up Recommendations  SNF    Equipment Recommendations  None recommended by OT    Recommendations for Other Services       Precautions / Restrictions Precautions Precautions: Fall;Knee Precaution Comments: left ant. hip incision Restrictions Weight Bearing Restrictions: No      Mobility Bed Mobility Overal bed mobility: Needs Assistance       Supine to sit: Mod assist;+2 for safety/equipment Sit to supine: Mod assist;+2 for safety/equipment   General bed mobility comments: assist for legs and trunk.  Pt did assist with scooting forward and backwards at EOB  Transfers Overall transfer level: Needs assistance Equipment used: Rolling walker (2 wheeled)   Sit to Stand: Min assist;+2 safety/equipment         General transfer comment: assist to rise and stabilize. Cues to extend trunk. Extra time    Balance                                           ADL either performed or assessed with clinical judgement   ADL Overall ADL's : Needs assistance/impaired Eating/Feeding: Set up Eating/Feeding Details (indicate cue type and reason): had a little difficulty cutting fruit in small cup; self fed with L hand Grooming: Set up;Sitting   Upper  Body Bathing: Set up;Sitting   Lower Body Bathing: Moderate assistance;Maximal assistance;+2 for physical assistance;+2 for safety/equipment;Sit to/from stand   Upper Body Dressing : Minimal assistance;Sitting(lines)   Lower Body Dressing: Maximal assistance;Sit to/from stand;+2 for physical assistance;+2 for safety/equipment                 General ADL Comments: sat EOB to finish fruit with encouragement. Pt anxious and needed encouragement. Able to sidestep up [place]      Pertinent Vitals/Pain Faces Pain Scale: Hurts little more(highest pain when moving; 1 sitting) Pain Location: mostly R knee, less in L hip Pain Intervention(s): Limited activity within patient's tolerance;Monitored during session;Repositioned     Hand Dominance Right   Extremity/Trunk Assessment Upper Extremity Assessment Upper Extremity Assessment: Generalized weakness;RUE deficits/detail;LUE deficits/detail RUE Deficits / Details: able to lift bil UEs to 90; R dominant and this hand has edema           Communication Communication Communication: No difficulties   Cognition Arousal/Alertness: Awake/alert Behavior During Therapy: Anxious Overall Cognitive Status: Within Functional Limits for tasks assessed  General Comments: needed cues to be restated a couple of times for movement   General Comments  VSS. Educated on positioning and moving R hand to decrease edema.  Will try built up foam to self feed with RUE.  Pt does use it functionally     Exercises     Shoulder Instructions      Home Living Family/patient expects to be discharged to:: Private residence Living Arrangements: Children Available Help at Discharge: Family               Bathroom Shower/Tub: Tub/shower unit   Bathroom Toilet: Handicapped height     Home Equipment: Environmental consultant - 2 wheels;Bedside commode;Cane - single point;Shower  seat          Prior Functioning/Environment Level of Independence: Needs assistance    ADL's / Homemaking Assistance Needed: family assisted with LB ADLs and to get onto tub seat.  Used reacher for pants            OT Problem List: Decreased strength;Decreased activity tolerance;Pain;Decreased knowledge of use of DME or AE;Decreased knowledge of precautions;Increased edema      OT Treatment/Interventions: Self-care/ADL training;DME and/or AE instruction;Therapeutic activities;Patient/family education(edema management)    OT Goals(Current goals can be found in the care plan section) Acute Rehab OT Goals Patient Stated Goal: to walk again, get left hip fixed OT Goal Formulation: With patient Time For Goal Achievement: 05/03/18 Potential to Achieve Goals: Good ADL Goals Pt Will Perform Grooming: with min guard assist;standing Pt Will Perform Lower Body Bathing: with min assist;with adaptive equipment;sit to/from stand Pt Will Perform Lower Body Dressing: with min assist;with adaptive equipment;sit to/from stand(pants/underwear with reacher) Pt Will Transfer to Toilet: with min assist;with +2 assist;stand pivot transfer;bedside commode Pt Will Perform Toileting - Clothing Manipulation and hygiene: with min assist;sit to/from stand Additional ADL Goal #1: pt will follow through with RUE edema control strategies and use RUE as dominant hand during adls  OT Frequency: Min 2X/week   Barriers to D/C:            Co-evaluation              AM-PAC OT "6 Clicks" Daily Activity     Outcome Measure Help from another person eating meals?: A Little Help from another person taking care of personal grooming?: A Little Help from another person toileting, which includes using toliet, bedpan, or urinal?: A Lot Help from another person bathing (including washing, rinsing, drying)?: A Lot Help from another person to put on and taking off regular upper body clothing?: A Little Help from  another person to put on and taking off regular lower body clothing?: A Lot 6 Click Score: 15   End of Session Nurse Communication: (drainage)  Activity Tolerance: Patient tolerated treatment well Patient left: in bed;with call bell/phone within reach;with bed alarm set  OT Visit Diagnosis: Pain Pain - Right/Left: Left Pain - part of body: Hip(and R knee)                Time: 9735-3299 OT Time Calculation (min): 33 min Charges:  OT General Charges $OT Visit: 1 Visit OT Evaluation $OT Eval Low Complexity: 1 Low OT Treatments $Therapeutic Activity: 8-22 mins  Lesle Chris, OTR/L Acute Rehabilitation Services [phone] [facility] pager [phone] office 04/19/2018  Daeton Kluth 04/19/2018, 2:21 PM

## 2018-04-19 NOTE — Progress Notes (Addendum)
Progress Note  Patient Name: Elizabeth [name] Date of Encounter: 04/19/2018  Primary Cardiologist: Skeet Latch, MD   Subjective   Breathing is stable but not at baseline. Denies chest pain. L hip pain is reasonably well controlled. No new issues overnight.   Inpatient Medications    Scheduled Meds: . amiodarone  400 mg Oral BID  . aspirin  81 mg Oral Daily  . Chlorhexidine Gluconate Cloth  6 each Topical Daily  . docusate sodium  100 mg Oral BID  . enoxaparin (LOVENOX) injection  40 mg Subcutaneous Q24H  . feeding supplement (PRO-STAT SUGAR FREE 64)  30 mL Oral BID  . hydrocortisone sodium succinate  50 mg Intravenous Q12H  . mouth rinse  15 mL Mouth Rinse BID  . midodrine  10 mg Oral TID WC  . multivitamin with minerals  1 tablet Oral Daily  . sodium chloride flush  10-40 mL Intracatheter Q12H   Continuous Infusions: .  ceFAZolin (ANCEF) IV 2 g (04/19/18 0506)   PRN Meds: acetaminophen, fentaNYL (SUBLIMAZE) injection, HYDROcodone-acetaminophen, metoCLOPramide **OR** metoCLOPramide (REGLAN) injection, ondansetron **OR** ondansetron (ZOFRAN) IV, promethazine, sodium chloride flush   Vital Signs    Vitals:   04/19/18 0300 04/19/18 0400 04/19/18 0459 04/19/18 0600  BP: (!) 131/47 (!) 110/47  (!) 108/42  Pulse: (!) 58 (!) 57  (!) 58  Resp: 15 15  15   Temp:  98.4 F (36.9 C)    TempSrc:  Oral    SpO2: 100% 100%  100%  Weight:   124.6 kg   Height:        Intake/Output Summary (Last 24 hours) at 04/19/2018 0806 Last data filed at 04/19/2018 0600 Gross per 24 hour  Intake 560 ml  Output 690 ml  Net -130 ml   Filed Weights   04/17/18 1159 04/18/18 0500 04/19/18 0459  Weight: 124.4 kg 111.5 kg 124.6 kg    Telemetry    Sinus rhythm with an episode of ventricular bigeminy - Personally Reviewed  Physical Exam   GEN: Laying in bed in no acute distress.   Neck: No JVD, no carotid bruits Cardiac: RRR, no murmurs, rubs, or gallops.  Respiratory: Clear to  auscultation bilaterally anteriorly, difficult to assess lung bases given body habitus and inability to sit upright. No obvious wheezes/ rales/ rhonchi GI: NABS, Soft, nontender, non-distended  MS: No edema; No deformity. Neuro:  Nonfocal, moving all extremities spontaneously Psych: Normal affect   Labs    Chemistry Recent Labs  Lab 04/16/18 0403 04/18/18 0851 04/19/18 0500  NA 137 136 138  K 4.6 4.0 3.9  CL 104 103 104  CO2 27 26 28   GLUCOSE 150* 190* 121*  BUN 32* 36* 30*  CREATININE 0.93 0.95 0.78  CALCIUM 7.9* 7.9* 7.9*  GFRNONAA >60 >60 >60  GFRAA >60 >60 >60  ANIONGAP 6 7 6      Hematology Recent Labs  Lab 04/16/18 0403 04/18/18 0851 04/19/18 0500  WBC 17.8* 15.1* 13.2*  RBC 3.07* 2.96* 2.68*  HGB 9.3* 8.9* 8.1*  HCT 30.0* 29.5* 27.0*  MCV 97.7 99.7 100.7*  MCH 30.3 30.1 30.2  MCHC 31.0 30.2 30.0  RDW 15.2 16.9* 17.4*  PLT 64* 69* 71*    Cardiac Enzymes Recent Labs  Lab 04/13/18 0107 04/16/18 2320 04/17/18 0503 04/17/18 1105  TROPONINI 0.07* 0.05* 0.05* 0.06*   No results for input(s): TROPIPOC in the last 168 hours.   BNPNo results for input(s): BNP, PROBNP in the last 168 hours.  DDimer No results for input(s): DDIMER in the last 168 hours.   Radiology    No results found.  Cardiac Studies    Echocardiogram 04/12/18: Study Conclusions  - Left ventricle: The cavity size was normal. Systolic function was normal. The estimated ejection fraction was in the range of 60% to 65%. Wall motion was normal; there were no regional wall motion abnormalities. - Aortic valve: Trileaflet; severely thickened, severely calcified leaflets. 1.5 x 2 cm calcified aortic mass noted (possible old calcified vegetation). This is new when compared to prior echocardiogram. Valve mobility was restricted. There was mild regurgitation. - Mitral valve: There was moderate regurgitation. - Tricuspid valve: There was moderate  regurgitation.  Transesophageal Echocardiogram 04/17/18: Findings: Please see echo section for full report. Normal LV size and wall thickness. EF 55-60% with normal wall motion. Normal right ventricular size and systolic function. Moderate left atrial enlargement, no LA appendage thrombus. Normal right atrium. No PFO or ASD by color doppler. Mild TR, peak RV-RA gradient 45 mmHg. No TV vegetation. There was moderate central mitral regurgitation. ERO by PISA was only 0.14 cm^2 but visually 3+ MR. There did not appear to be a mitral valve vegetation. No systolic flow reversal in the pulmonary vein doppler pattern. The aortic valve was trileaflet with a large vegetation that appeared to involve all leaflets. There did appear to be be perforation. There appeared to be severe, eccentric aortic insufficiency with holodiastolic flow reversal in the ascending thoracic aorta. Mean gradient across the aortic valve was 32 mmHg, suggesting moderate aortic stenosis (elevated gradient was likely due to a combination of high flow from severe AI and leaflet impingement from bulky vegetation.   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 3+ mitral regurgitation without vegetation noted.    Patient Profile     67 y.o.femalewith past medical history of hypertension, hyperlipidemia, previous lung cancer status post lobectomyandmild aortic stenosis.Patient admitted December 8 with left hip pain, fever and hypotension. She is being treated for septic arthritis.Cardiology consulted for SVT.Also found to have an aortic mass noted on TTE (possible old calcified vegetation).  Assessment & Plan    1. Aortic valve endocarditis: TTE with evidence of calcified vegetation. TEE confirmed aortic valve endocarditis with severe eccentric AI and perforation. She is currently being maintained on IV antibiotics for septic arthritis  with Staph lugdunensis bacteremia. BCx as of 04/11/18 with NGTD. She underwent wash out/debridement of left hip 04/17/18 with NGTD from synovial fluid. Ultimately will need CT surgery evaluation to determine candidacy for aortic valve replacement. Will need to undergo a cardiac catheterization for coronary artery evaluation prior to possible AVR. - Recommend completion of antibiotics prior to undergoing cardiac catheterization in an effort to minimize risk of dislodging the vegetation and causing an embolic event.   2. Recurrent SVT: Seen by EP this admission and started on IV amiodarone for SVT/atrial tachycardia. Could be considered for EPS/Ablation in the future if rhythm difficult to control. Transitioned to po amiodarone 04/18/18 - Continue amiodarone 400mg  BID x2 weeks, then taper to 400mg  daily x2 weeks, then 200mg  daily.    3. Septic arthritis: patient presented with L hip pain following a steroid injection. Found to have sepsis/ staph lugdunensis bacteremia. Underwent left hip wash out/debridement with ortho 04/17/18 with drain in place. ID following. On IV cefazolin per sensitivities.  - Continue management per primary team, ID, and ortho  4. Volume overload: patient with some SOB and has  been requiring O2 via [place]. CXR 12/15 with mild pulmonary edema. She is up 15L this admission.  - Will give IV lasix 40mg  this morning and monitor for response - Monitor strict I&Os and daily weights   For questions or updates, please contact [facility] Please consult [email or web address] for contact info under Cardiology/STEMI.      Signed, Abigail Butts, PA-C  04/19/2018, 8:06 AM   [phone]  I have seen and examined the patient along with Abigail Butts, PA-C , PA NP.  I have reviewed the chart, notes and new data.  I agree with PA/NP's note.  Key new complaints: still has orthopnea, improved appetite, overall better than yesterday Key examination changes: lungs are  clearer  PLAN: Continue IV diuretics.  Sanda Klein, MD, [place] [phone] 04/19/2018, 1:45 PM

## 2018-04-19 NOTE — Progress Notes (Signed)
   Subjective: 2 Days Post-Op Procedure(s) (LRB): IRRIGATION AND DEBRIDEMENT LEFT HIP (ANTERIOR) (Left) Patient reports pain as mild.   Patient seen in rounds for Dr. Wynelle Link. States pain in the left hip is improved this AM.   Objective: Vital signs in last 24 hours: Temp:  [97.7 F (36.5 C)-98.4 F (36.9 C)] 98.4 F (36.9 C) (12/18 0400) Pulse Rate:  [56-82] 65 (12/18 0800) Resp:  [14-24] 21 (12/18 0800) BP: (105-131)/(35-48) 115/41 (12/18 0800) SpO2:  [97 %-100 %] 100 % (12/18 0800) Weight:  [124.6 kg] 124.6 kg (12/18 0459)  Intake/Output from previous day:  Intake/Output Summary (Last 24 hours) at 04/19/2018 0819 Last data filed at 04/19/2018 0600 Gross per 24 hour  Intake 560 ml  Output 690 ml  Net -130 ml    Labs: Recent Labs    04/18/18 0851 04/19/18 0500  HGB 8.9* 8.1*   Recent Labs    04/18/18 0851 04/19/18 0500  WBC 15.1* 13.2*  RBC 2.96* 2.68*  HCT 29.5* 27.0*  PLT 69* 71*   Recent Labs    04/18/18 0851 04/19/18 0500  NA 136 138  K 4.0 3.9  CL 103 104  CO2 26 28  BUN 36* 30*  CREATININE 0.95 0.78  GLUCOSE 190* 121*  CALCIUM 7.9* 7.9*   Exam: General - Patient is Alert and Oriented Extremity - Neurologically intact Neurovascular intact Sensation intact distally Dorsiflexion/Plantar flexion intact Dressing/Incision - clean, dry, with minimal bloody drainage from hemovac port site Motor Function - intact, moving foot and toes well on exam.   Past Medical History:  Diagnosis Date  . Aortic stenosis, mild 01/08/2015  . Dyspnea    with rest   . Dysrhythmia   . Hx of cancer of lung 6/10   Removed June 2010  . Hyperlipidemia   . Hypertension   . Low back pain   . Morbid obesity ([place]) 01/08/2015  . Right foot injury    report she sustained a fracture to her right , present with ortho boot on affected extremity   . 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   Weight as of this encounter: 124.6 kg. Up with therapy  DVT Prophylaxis - Aspirin and Lovenox Weight-bearing as tolerated  Hemovac drain pulled without difficulty and dressing changed. Incision looks great. Continue working with therapy.  We will continue to follow.  Theresa Duty, PA-C Orthopedic Surgery 04/19/2018, 8:19 AM

## 2018-04-20 LAB — IRON AND TIBC
Iron: 16 ug/dL — ABNORMAL LOW (ref 28–170)
Saturation Ratios: 6 % — ABNORMAL LOW (ref 10.4–31.8)
TIBC: 273 ug/dL (ref 250–450)
UIBC: 257 ug/dL

## 2018-04-20 LAB — BASIC METABOLIC PANEL
Anion gap: 10 (ref 5–15)
BUN: 26 mg/dL — ABNORMAL HIGH (ref 8–23)
CO2: 27 mmol/L (ref 22–32)
Calcium: 7.6 mg/dL — ABNORMAL LOW (ref 8.9–10.3)
Chloride: 104 mmol/L (ref 98–111)
Creatinine, Ser: 0.78 mg/dL (ref 0.44–1.00)
GFR calc non Af Amer: >60 mL/min (ref >60)
Glucose, Bld: 118 mg/dL — ABNORMAL HIGH (ref 70–99)
POTASSIUM: 3.5 mmol/L (ref 3.5–5.1)
Sodium: 141 mmol/L (ref 135–145)

## 2018-04-20 LAB — CBC
HCT: 27.3 % — ABNORMAL LOW (ref 36.0–46.0)
Hemoglobin: 7.9 g/dL — ABNORMAL LOW (ref 12.0–15.0)
MCH: 28.9 pg (ref 26.0–34.0)
MCHC: 28.9 g/dL — ABNORMAL LOW (ref 30.0–36.0)
MCV: 100 fL (ref 80.0–100.0)
NRBC: 0 % (ref 0.0–0.2)
Platelets: 75 10*3/uL — ABNORMAL LOW (ref 150–400)
RBC: 2.73 MIL/uL — AB (ref 3.87–5.11)
RDW: 18.1 % — ABNORMAL HIGH (ref 11.5–15.5)
WBC: 12.4 10*3/uL — ABNORMAL HIGH (ref 4.0–10.5)

## 2018-04-20 LAB — FERRITIN: Ferritin: 128 ng/mL (ref 11–307)

## 2018-04-20 LAB — FOLATE: Folate: 10.8 ng/mL (ref >5.9)

## 2018-04-20 LAB — RETICULOCYTES
Immature Retic Fract: 32.7 % — ABNORMAL HIGH (ref 2.3–15.9)
RBC.: 2.73 MIL/uL — ABNORMAL LOW (ref 3.87–5.11)
Retic Count, Absolute: 271.4 10*3/uL — ABNORMAL HIGH (ref 19.0–186.0)
Retic Ct Pct: 9.9 % — ABNORMAL HIGH (ref 0.4–3.1)

## 2018-04-20 LAB — VITAMIN B12: Vitamin B-12: 507 pg/mL (ref 180–914)

## 2018-04-20 MED ORDER — FUROSEMIDE 10 MG/ML IJ SOLN: 40.0000 mg | Freq: Two times a day (BID) | INTRAMUSCULAR | Status: DC

## 2018-04-20 MED ORDER — SODIUM CHLORIDE 0.9 % IV BOLUS
250.0000 mL | Freq: Once | INTRAVENOUS | Status: AC
  Administered 2018-04-20: 250 mL via INTRAVENOUS

## 2018-04-20 MED ORDER — SODIUM CHLORIDE 0.9 % IV SOLN
500.0000 mL | Freq: Once | INTRAVENOUS | Status: AC
  Administered 2018-04-20: 500 mL via INTRAVENOUS

## 2018-04-20 MED ORDER — HYDROCODONE-ACETAMINOPHEN 5-325 MG PO TABS
1.0000 | ORAL_TABLET | ORAL | Status: DC | PRN
  Administered 2018-04-22 – 2018-04-23 (×5): 1 via ORAL
  Filled 2018-04-20 (×5): qty 1

## 2018-04-20 MED ORDER — MIDODRINE HCL 5 MG PO TABS
10.0000 mg | ORAL_TABLET | ORAL | Status: AC
  Administered 2018-04-20: 10 mg via ORAL
  Filled 2018-04-20: qty 2

## 2018-04-20 MED ORDER — HYDROCORTISONE NA SUCCINATE PF 100 MG IJ SOLR
50.0000 mg | Freq: Two times a day (BID) | INTRAMUSCULAR | Status: DC
  Administered 2018-04-20 – 2018-04-21 (×4): 50 mg via INTRAVENOUS
  Filled 2018-04-20 (×4): qty 2

## 2018-04-20 MED ORDER — SODIUM CHLORIDE 0.9 % IV SOLN
INTRAVENOUS | Status: DC
  Administered 2018-04-20 – 2018-04-29 (×3): via INTRAVENOUS

## 2018-04-20 MED ORDER — SODIUM CHLORIDE 0.9 % IV BOLUS
500.0000 mL | Freq: Once | INTRAVENOUS | Status: AC
  Administered 2018-04-20: 500 mL via INTRAVENOUS

## 2018-04-20 MED ORDER — SODIUM CHLORIDE 0.9 % IV BOLUS: 250.0000 mL | Freq: Once | INTRAVENOUS | Status: DC

## 2018-04-20 MED ORDER — FERROUS SULFATE 325 (65 FE) MG PO TABS
325.0000 mg | ORAL_TABLET | Freq: Two times a day (BID) | ORAL | Status: DC
  Administered 2018-04-20 – 2018-04-24 (×9): 325 mg via ORAL
  Filled 2018-04-20 (×9): qty 1

## 2018-04-20 MED ORDER — FUROSEMIDE 10 MG/ML IJ SOLN
40.0000 mg | Freq: Once | INTRAMUSCULAR | Status: AC
  Administered 2018-04-20: 40 mg via INTRAVENOUS
  Filled 2018-04-20: qty 4

## 2018-04-20 NOTE — Progress Notes (Signed)
   Subjective: 3 Days Post-Op Procedure(s) (LRB): IRRIGATION AND DEBRIDEMENT LEFT HIP (ANTERIOR) (Left) Patient reports pain as mild, still c/o fatigue which is expected.    Patient seen in rounds with Dr. Wynelle Link.  Objective: Vital signs in last 24 hours: Temp:  [97.8 F (36.6 C)-98.4 F (36.9 C)] 98 F (36.7 C) (12/19 0315) Pulse Rate:  [63-80] 72 (12/19 0440) Resp:  [16-27] 20 (12/19 0440) BP: (85-125)/(27-91) 125/48 (12/19 0440) SpO2:  [95 %-100 %] 97 % (12/19 0440) Weight:  [125.4 kg] 125.4 kg (12/19 0445)  Intake/Output from previous day:  Intake/Output Summary (Last 24 hours) at 04/20/2018 0719 Last data filed at 04/20/2018 0600 Gross per 24 hour  Intake 2118.98 ml  Output 2200 ml  Net -81.02 ml    Labs: Recent Labs    04/18/18 0851 04/19/18 0500 04/20/18 0407  HGB 8.9* 8.1* 7.9*   Recent Labs    04/19/18 0500 04/20/18 0407  WBC 13.2* 12.4*  RBC 2.68* 2.73*  2.73*  HCT 27.0* 27.3*  PLT 71* 75*   Recent Labs    04/19/18 0500 04/20/18 0407  NA 138 141  K 3.9 3.5  CL 104 104  CO2 28 27  BUN 30* 26*  CREATININE 0.78 0.78  GLUCOSE 121* 118*  CALCIUM 7.9* 7.6*   Exam: General - Patient is Alert and Oriented Extremity - Neurologically intact Neurovascular intact Sensation intact distally Dorsiflexion/Plantar flexion intact Dressing/Incision - clean, dry, with serosanguineous drainage from hemovac port site. Motor Function - intact, moving foot and toes well on exam.   Past Medical History:  Diagnosis Date  . Aortic stenosis, mild 01/08/2015  . Dyspnea    with rest   . Dysrhythmia   . Hx of cancer of lung 6/10   Removed June 2010  . Hyperlipidemia   . Hypertension   . Low back pain   . Morbid obesity ([place]) 01/08/2015  . Right foot injury    report she sustained a fracture to her right , present with ortho boot on affected extremity   . 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   Weight as of this encounter: 125.4 kg. Up with therapy  DVT Prophylaxis - Aspirin Weight-bearing as tolerated  Intra-operative cultures show no growth at 2 days. Hip looks great. Will continue to follow.  Theresa Duty, PA-C Orthopedic Surgery 04/20/2018, 7:19 AM

## 2018-04-20 NOTE — Clinical Social Work Note (Signed)
Clinical Social Work Assessment  Patient Details  Name: Elizabeth [name] MRN: [id number] Date of Birth: Dec 13, 1950  Date of referral:  04/20/18               Reason for consult:  Facility Placement                Permission sought to share information with:  Family Supports Permission granted to share information::  Yes, Verbal Permission Granted  Name::     Ambulance person::     Relationship::  Daughter  Contact Information:     Housing/Transportation Living arrangements for the past 2 months:  Single Family Home Source of Information:  Patient, Adult Children Patient Interpreter Needed:  None Criminal Activity/Legal Involvement Pertinent to Current Situation/Hospitalization:  No - Comment as needed Significant Relationships:  Adult Children Lives with:  Self Do you feel safe going back to the place where you live?  Yes Need for family participation in patient care:  Yes (Comment)  Care giving concerns:  Patient does not have much support at home. Daughter, Ebony Hail is temporarily living with patient but has a small child and works full time and help is limited.   Social Worker assessment / plan:  LCSW consulted for SNF placement.   LCSW met at bedside with patient and daughter, Ebony Hail. Patient is agreaable to SNF at dc. Patient and daughter provided information to complete assessment.   Patient reports that she was working up to a year ago when she had ankle surgery. After that she has had multiple ortho surgeries that have prevented her from working. Patient primarily lives alone, but daughter and grandson are living with her temporarily.   At baseline patient reports that she was using a walker to ambulate and sometimes a wheel chair. Patient reports that she was driving prior to most recent surgery. She reports that she was able to feed herself, but required some assistance with ADLs.  Patient and family agreeable to SNF at dc.    Employment status:  Disabled (Comment on  whether or not currently receiving Disability) Insurance information:  Medicare PT Recommendations:  [place] / Referral to community resources:     Patient/Family's Response to care:  Patient and family thankful for LCSW visit and resources. Patient and family asked questions regarding SNF placement and process. LCSW answered questions.   Patient/Family's Understanding of and Emotional Response to Diagnosis, Current Treatment, and Prognosis:  Patient and family are realistic about patients current condition. Patient and family both acknowledge the need for patient to go to rehab prior to returning home.   Emotional Assessment Appearance:  Appears stated age Attitude/Demeanor/Rapport:    Affect (typically observed):  Calm, Pleasant Orientation:  Oriented to Self, Oriented to Place, Oriented to  Time, Oriented to Situation Alcohol / Substance use:  Not Applicable Psych involvement (Current and /or in the community):  No (Comment)  Discharge Needs  Concerns to be addressed:  No discharge needs identified Readmission within the last 30 days:  No Current discharge risk:  None Barriers to Discharge:  Continued Medical Work up, No SNF bed   Servando Snare, LCSW 04/20/2018, 4:16 PM

## 2018-04-20 NOTE — NC FL2 (Signed)
[place] LEVEL OF CARE SCREENING TOOL     IDENTIFICATION  Patient Name: Elizabeth [name] Birthdate: 1950/12/05 Sex: female Admission Date (Current Location): 04/09/2018  [facility] [place] Number:  Herbalist and Address:  [facility],  [place] [address] [place]      Provider Number: [id number]  Attending Physician Name and Address:  Elmarie Shiley, MD  Relative Name and Phone Number:       Current Level of Care: Hospital Recommended Level of Care: [place] Prior Approval Number:    Date Approved/Denied: 04/20/18 PASRR Number: [id number]  Discharge Plan: SNF    Current Diagnoses: Patient Active Problem List   Diagnosis Date Noted  . Nonrheumatic aortic (valve) insufficiency   . SVT (supraventricular tachycardia) ([place])   . 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 Infective endocarditis   . Thrombocytopenia ([place])   . Anemia of chronic disease   . Chest tightness   . Fever   . Pressure injury of skin 04/10/2018  . Septic arthritis ([place]) 04/09/2018  . Acute kidney injury (AKI) with acute tubular necrosis (ATN) (HCC)   . Arterial hypotension   . Sepsis with acute organ dysfunction and septic shock (HCC)   . OA (osteoarthritis) of knee 12/12/2017  . Idiopathic chronic venous hypertension of both lower extremities with inflammation 06/29/2016  . Subtalar joint instability, left 06/29/2016  . Arthritis of right knee 01/30/2016  . Lower extremity edema 10/21/2015  . Leg cramps 08/21/2015  . Left ankle pain 04/09/2015  . Anxiety 01/20/2015  . Nonrheumatic aortic insufficiency with aortic stenosis 01/08/2015  . Obesity, Class III, BMI 40-49.9 (morbid obesity) ([place]) 01/08/2015  . Varicose veins 01/02/2015  . Snoring 12/06/2014  . Viral gastroenteritis 04/06/2013  . Insect bite of ankle, right, infected 07/31/2012  . Primary cancer of right upper lobe of lung ([place]) 10/18/2011  . Left hip pain 06/29/2011  .  Pain in joint, lower leg 03/17/2009  . GASTROENTERITIS 07/11/2008  . PULMONARY NODULE, SOLITARY 04/18/2008  . LIVER FUNCTION TESTS, ABNORMAL, HX OF 04/01/2008  . B12 DEFICIENCY 11/13/2007  . COUGH 11/13/2007  . UNSPECIFIED VITAMIN D DEFICIENCY 11/07/2007  . TINEA PEDIS 07/07/2007  . LYMPHADENOPATHY 11/16/2006  . Hyperlipidemia 09/15/2006  . Essential hypertension 09/15/2006  . LOW BACK PAIN 09/15/2006    Orientation RESPIRATION BLADDER Height & Weight     Self, Time, Situation, Place  O2 Continent, External catheter Weight: 276 lb 7.3 oz (125.4 kg) Height:  5\' 4"  (162.6 cm)  BEHAVIORAL SYMPTOMS/MOOD NEUROLOGICAL BOWEL NUTRITION STATUS      Continent Diet(See dc summary)  AMBULATORY STATUS COMMUNICATION OF NEEDS Skin   Extensive Assist Verbally PU Stage and Appropriate Care(Sacrum right and left)                       Personal Care Assistance Level of Assistance  Bathing, Feeding, Dressing Bathing Assistance: Maximum assistance Feeding assistance: Independent Dressing Assistance: Maximum assistance     Functional Limitations Info  Sight, Hearing, Speech Sight Info: Impaired Hearing Info: Adequate Speech Info: Adequate    SPECIAL CARE FACTORS FREQUENCY  PT (By licensed PT), OT (By licensed OT)     PT Frequency: 5x/week OT Frequency: 5x/week            Contractures Contractures Info: Not present    Additional Factors Info  Code Status, Allergies Code Status Info: Full Allergies Info: Penicillins  Current Medications (04/20/2018):  This is the current hospital active medication list Current Facility-Administered Medications  Medication Dose Route Frequency Provider Last Rate Last Dose  . 0.9 %  sodium chloride infusion   Intravenous Continuous Regalado, Belkys A, MD 10 mL/hr at 04/20/18 0413    . acetaminophen (TYLENOL) tablet 650 mg  650 mg Oral Q4H PRN Gaynelle Arabian, MD   650 mg at 04/20/18 1038  . amiodarone (PACERONE) tablet 400 mg  400  mg Oral BID Roby Lofts M., PA-C   400 mg at 04/20/18 1038  . aspirin chewable tablet 81 mg  81 mg Oral Daily Gaynelle Arabian, MD   81 mg at 04/20/18 1038  . ceFAZolin (ANCEF) IVPB 2g/100 mL premix  2 g Intravenous Q8H Aluisio, Pilar Plate, MD 200 mL/hr at 04/20/18 1427 2 g at 04/20/18 1427  . Chlorhexidine Gluconate Cloth 2 % PADS 6 each  6 each Topical Daily Gaynelle Arabian, MD   6 each at 04/20/18 1118  . docusate sodium (COLACE) capsule 100 mg  100 mg Oral BID Gaynelle Arabian, MD   100 mg at 04/20/18 1038  . feeding supplement (PRO-STAT SUGAR FREE 64) liquid 30 mL  30 mL Oral BID Gaynelle Arabian, MD   30 mL at 04/20/18 1039  . ferrous sulfate tablet 325 mg  325 mg Oral BID WC Regalado, Belkys A, MD   325 mg at 04/20/18 1038  . HYDROcodone-acetaminophen (NORCO/VICODIN) 5-325 MG per tablet 1 tablet  1 tablet Oral Q4H PRN Regalado, Belkys A, MD      . hydrocortisone sodium succinate (SOLU-CORTEF) 100 MG injection 50 mg  50 mg Intravenous Q12H Regalado, Belkys A, MD   50 mg at 04/20/18 1040  . MEDLINE mouth rinse  15 mL Mouth Rinse BID Gaynelle Arabian, MD   15 mL at 04/20/18 1039  . metoCLOPramide (REGLAN) tablet 5-10 mg  5-10 mg Oral Q8H PRN Aluisio, Pilar Plate, MD       Or  . metoCLOPramide (REGLAN) injection 5-10 mg  5-10 mg Intravenous Q8H PRN Aluisio, Pilar Plate, MD      . midodrine (PROAMATINE) tablet 10 mg  10 mg Oral TID WC Gaynelle Arabian, MD   10 mg at 04/20/18 1136  . multivitamin with minerals tablet 1 tablet  1 tablet Oral Daily Gaynelle Arabian, MD   1 tablet at 04/20/18 1038  . ondansetron (ZOFRAN) tablet 4 mg  4 mg Oral Q6H PRN Gaynelle Arabian, MD       Or  . ondansetron (ZOFRAN) injection 4 mg  4 mg Intravenous Q6H PRN Aluisio, Pilar Plate, MD      . promethazine (PHENERGAN) tablet 25 mg  25 mg Oral Q6H PRN Gaynelle Arabian, MD   25 mg at 04/14/18 2111  . sodium chloride flush (NS) 0.9 % injection 10-40 mL  10-40 mL Intracatheter Q12H Gaynelle Arabian, MD   10 mL at 04/20/18 1039     Discharge  Medications: Please see discharge summary for a list of discharge medications.  Relevant Imaging Results:  Relevant Lab Results:   Additional Information ssn: [id number]  Servando Snare, LCSW

## 2018-04-20 NOTE — Progress Notes (Signed)
Physical Therapy Treatment Patient Details Name: Elizabeth [name] MRN: [id number] DOB: Jan 12, 1951 Today's Date: 04/20/2018    History of Present Illness 67 yo female s/p R TKA 8/19, admitted 12/16 with septic arthritis of left hip, s/P I and D on 04/17/18.TEE 12/16.    PT Comments    The patient is requiring extensive assistance for mobility. Patient is not demonstrating enough strength to sfaely transfer so STEDY lift used to assist to [facility]. Continue  PT   Follow Up Recommendations  SNF     Equipment Recommendations  None recommended by PT    Recommendations for Other Services       Precautions / Restrictions Precautions Precautions: Fall;Knee Precaution Comments: left ant. hip incision    Mobility  Bed Mobility Overal bed mobility: Needs Assistance Bed Mobility: Supine to Sit     Supine to sit: Mod assist;+2 for safety/equipment Sit to supine: Mod assist;+2 for safety/equipment   General bed mobility comments: assist for legs and trunk.  Pt did assist with scooting forward   Transfers Overall transfer level: Needs assistance Equipment used: Rolling walker (2 wheeled)             General transfer comment: Stedy used. Much assist to rise from the bed and Stedy. Transferred to [facility] with stedy.  Ambulation/Gait                 Stairs             Wheelchair Mobility    Modified Rankin (Stroke Patients Only)       Balance                                            Cognition Arousal/Alertness: Awake/alert Behavior During Therapy: Flat affect                                   General Comments: much encouragement to mobilize, make efforts to mobilize      Exercises      General Comments        Pertinent Vitals/Pain Faces Pain Scale: Hurts little more Pain Location: mostly R knee, less in L hip Pain Descriptors / Indicators: Aching Pain Intervention(s): Monitored during session;Premedicated before  session    Home Living                      Prior Function            PT Goals (current goals can now be found in the care plan section) Progress towards PT goals: Progressing toward goals    Frequency    Min 3X/week      PT Plan Current plan remains appropriate    Co-evaluation              AM-PAC PT "6 Clicks" Mobility   Outcome Measure  Help needed turning from your back to your side while in a flat bed without using bedrails?: Total Help needed moving from lying on your back to sitting on the side of a flat bed without using bedrails?: Total Help needed moving to and from a bed to a chair (including a wheelchair)?: Total Help needed standing up from a chair using your arms (e.g., wheelchair or bedside chair)?: Total Help needed to walk in hospital room?: Total Help  needed climbing 3-5 steps with a railing? : Total 6 Click Score: 6    End of Session Equipment Utilized During Treatment: Gait belt(STEDY) Activity Tolerance: Patient limited by pain;Patient limited by fatigue Patient left: with call bell/phone within reach;with nursing/sitter in room(on [facility]) Nurse Communication: Mobility status;Need for lift equipment PT Visit Diagnosis: Unsteadiness on feet (R26.81)     Time: 4801-6553 PT Time Calculation (min) (ACUTE ONLY): 41 min  Charges:  $Therapeutic Activity: 38-52 mins                     Tresa Endo PT Acute Rehabilitation Services Pager [phone] Office [phone]    Claretha [place] 04/20/2018, 12:51 PM

## 2018-04-20 NOTE — Progress Notes (Signed)
PROGRESS NOTE    Elizabeth [name]  AVW:[id number] DOB: 1950-11-05 DOA: 04/09/2018 PCP: Ann Held, DO   Brief Narrative:  67 year old with past medical history significant for osteoarthritis of the left hip who underwent an a steroid injection on the same on 11/11 (per IR) who developed left hip pain on 11/14.  Patient was admitted to [facility] long hospital on 12/09 with septic shock, found to have bacteremia with a staph lugdunesis.  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  Cardiology and ID has been helping with patient's care.  Patient also found to have left hip septic arthritis, she underwent irrigation and debridement by Dr. Maureen Ralphs  on 04/18/2018.  Patient also hospital course complicated by SVT, now on amiodarone.  Volume overload, persistent hypotension on midodrine.     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 -Off IV pressor.  CCM sign off. -On [facility] service since 12/16. -Continue on Midodrine 3 times daily. -TEE ; aortic valve endocarditis with severe eccentric AI and perforation. -Need to finish IV antibiotics before heart cath and CT surgery evaluation. -We will likely need 6 to 8 weeks of IV antibiotics.  Follow ID recommendation for length of  treatment. -White blood cell trending down. Hypotension last night, unclear if related to pain medication vs lasix. Decrease pain medication to one tablet.  BP improved this am.  Continue with hydrocortisone BID.   Left hip septic arthritis, -Status post left hip irrigation and debridement and drain placement on 12/16.. -Drain removed 12/18. -synovial fluid no growth to date.   Thrombocytopenia; the related to acute infection process. Stable.  Recurrent SVT/atrial tachycardia: She was initially on amiodarone drip.  Transition to oral amiodarone on 12/17 Cardiology following..  Acute diastolic heart failure exacerbation Patient with pulmonary edema, volume overload. Will defer lasix to cardiology   Anemia of chronic disease; iron deficiency; Hemoglobin baseline around 9. Start ferrous sulfate.   Hypertension; continue to hold Benicar and hydrochlorothiazide Patient currently on a steroid stress dose and midodrine.   H/O lung cancer resected 2010.  Morbid Obesity.     RN Pressure Injury Documentation: Pressure Injury 04/09/18 Stage II -  Partial thickness loss of dermis presenting as a shallow open ulcer with a red, pink wound bed without slough. (Active)  04/09/18 2230  Location: Sacrum  Location Orientation: Right  Staging: Stage II -  Partial thickness loss of dermis presenting as a shallow open ulcer with a red, pink wound bed without slough.  Wound Description (Comments):   Present on Admission: Yes     Pressure Injury 04/09/18 Stage II -  Partial thickness loss of dermis presenting as a shallow open ulcer with a red, pink wound bed without slough. (Active)  04/09/18 2230  Location: Sacrum  Location Orientation: Left  Staging: Stage II -  Partial thickness loss of dermis presenting as a shallow open ulcer with a red, pink wound bed without slough.  Wound Description (Comments):   Present on Admission: Yes    Malnutrition Type:  Nutrition Problem: Increased  nutrient needs Etiology: acute illness, wound healing   Malnutrition Characteristics:  Signs/Symptoms: estimated needs   Nutrition Interventions:  Interventions: Prostat, MVI  Estimated body mass index is 47.45 kg/m as calculated from the following:   Height as of this encounter: 5\' 4"  (1.626 m).   Weight as of this encounter: 125.4 kg.   DVT prophylaxis: Eliquis Code Status: Full code Family Communication: Care discussed with patient Disposition Plan: She will need a skilled nursing facility, social worker consulted.   Consultants:   Cardiology  ID  CCM sign off  Orthopedic, Dr Elmyra Ricks   Procedures:   TEE on 12/15  Left hip washout with drain placement on 12/15   Antimicrobials:   Ancef 12-09   Subjective: She is feeling ok, denies dizziness.  Denies worsening dyspnea.  BP decrease last night.    Objective: Vitals:   04/20/18 0440 04/20/18 0445 04/20/18 0700 04/20/18 0755  BP: (!) 125/48  (!) 125/48 (!) 129/44  Pulse: 72  66 68  Resp: 20  18 (!) 22  Temp:      TempSrc:      SpO2: 97%  97% 96%  Weight:  125.4 kg    Height:        Intake/Output Summary (Last 24 hours) at 04/20/2018 0819 Last data filed at 04/20/2018 0800 Gross per 24 hour  Intake 1819.69 ml  Output 2150 ml  Net -330.31 ml   Filed Weights   04/18/18 0500 04/19/18 0459 04/20/18 0445  Weight: 111.5 kg 124.6 kg 125.4 kg    Examination:  General exam: NAD Respiratory system: Bilateral crackles.  Cardiovascular system: S 1, S 2 RRR Gastrointestinal system: BS present, soft, nt Central nervous system: Alert and oriented.  Extremities: symmetric edema Skin: left hip with clean dressing.  Psychiatry: flat affect    Data Reviewed: I have personally reviewed following labs and imaging studies  CBC: Recent Labs  Lab 04/14/18 0500 04/14/18 0632 04/15/18 0500 04/16/18 0403 04/18/18 0851 04/19/18 0500 04/20/18 0407  WBC QUESTIONABLE RESULTS, RECOMMEND RECOLLECT  TO VERIFY 22.3* 22.5* 17.8* 15.1* 13.2* 12.4*  NEUTROABS QUESTIONABLE RESULTS, RECOMMEND RECOLLECT TO VERIFY 19.2* 18.6* 14.6*  --  10.5*  --   HGB QUESTIONABLE RESULTS, RECOMMEND RECOLLECT TO VERIFY 8.6* 9.5* 9.3* 8.9* 8.1* 7.9*  HCT QUESTIONABLE RESULTS, RECOMMEND RECOLLECT TO VERIFY 28.2* 30.5* 30.0* 29.5* 27.0* 27.3*  MCV QUESTIONABLE RESULTS, RECOMMEND RECOLLECT TO VERIFY 96.6 95.0 97.7 99.7 100.7* 100.0  PLT QUESTIONABLE RESULTS, RECOMMEND RECOLLECT TO VERIFY 84* 76* 64* 69* 71* 75*   Basic Metabolic Panel: Recent Labs  Lab 04/16/18 0230 04/16/18 0403 04/18/18 0851 04/19/18 0500 04/20/18 0407  NA 137 137 136 138 141  K 4.6 4.6 4.0 3.9 3.5  CL 103 104 103 104 104  CO2 27 27 26 28 27   GLUCOSE 156* 150* 190* 121* 118*  BUN 34* 32* 36* 30* 26*  CREATININE 0.93 0.93 0.95 0.78 0.78  CALCIUM 8.1* 7.9* 7.9* 7.9* 7.6*  MG  --   --  2.4  --   --    GFR: Estimated Creatinine Clearance: 88.2 mL/min (by C-G formula based on SCr of 0.78 mg/dL). Liver Function Tests: No results for input(s): AST, ALT, ALKPHOS, BILITOT, PROT, ALBUMIN in the last 168 hours. No results for input(s): LIPASE,  AMYLASE in the last 168 hours. No results for input(s): AMMONIA in the last 168 hours. Coagulation Profile: No results for input(s): INR, PROTIME in the last 168 hours. Cardiac Enzymes: Recent Labs  Lab 04/16/18 2320 04/17/18 0503 04/17/18 1105  TROPONINI 0.05* 0.05* 0.06*   BNP (last 3 results) No results for input(s): PROBNP in the last 8760 hours. HbA1C: No results for input(s): HGBA1C in the last 72 hours. CBG: No results for input(s): GLUCAP in the last 168 hours. Lipid Profile: No results for input(s): CHOL, HDL, LDLCALC, TRIG, CHOLHDL, LDLDIRECT in the last 72 hours. Thyroid Function Tests: No results for input(s): TSH, T4TOTAL, FREET4, T3FREE, THYROIDAB in the last 72 hours. Anemia Panel: Recent Labs    04/20/18 0407  VITAMINB12 507  FOLATE 10.8  FERRITIN 128  TIBC 273  IRON  16*  RETICCTPCT 9.9*   Sepsis Labs: Recent Labs  Lab 04/14/18 1015 04/15/18 0500 04/16/18 0403  PROCALCITON 0.24 0.18 0.13    Recent Results (from the past 240 hour(s))  Body fluid culture     Status: None   Collection Time: 04/10/18  5:15 PM  Result Value Ref Range Status   Specimen Description   Final    SYNOVIAL Performed at [facility] [address], [place], [place] [address]    Special Requests   Final    HIP Performed at [facility], [place] [address], [place], [place] [address]    Gram Stain   Final    RARE WBC PRESENT,BOTH PMN AND MONONUCLEAR NO ORGANISMS SEEN    Culture   Final    RARE STAPHYLOCOCCUS LUGDUNENSIS CRITICAL RESULT CALLED TO, READ BACK BY AND VERIFIED WITH: RN Jerre Simon [id number] AT 44 AM BY CN Performed at [facility], [place] [address], [place], [place] [address]    Report Status 04/14/2018 FINAL  Final   Organism ID, Bacteria STAPHYLOCOCCUS LUGDUNENSIS  Final      Susceptibility   Staphylococcus lugdunensis - MIC*    CIPROFLOXACIN <=0.5 SENSITIVE Sensitive     ERYTHROMYCIN >=8 RESISTANT Resistant     GENTAMICIN <=0.5 SENSITIVE Sensitive     OXACILLIN 0.5 SENSITIVE Sensitive     TETRACYCLINE <=1 SENSITIVE Sensitive     VANCOMYCIN <=0.5 SENSITIVE Sensitive     TRIMETH/SULFA <=10 SENSITIVE Sensitive     CLINDAMYCIN RESISTANT Resistant     RIFAMPIN <=0.5 SENSITIVE Sensitive     Inducible Clindamycin POSITIVE Resistant     * RARE STAPHYLOCOCCUS LUGDUNENSIS  Culture, blood (Routine X 2) w Reflex to ID Panel     Status: None   Collection Time: 04/11/18  4:38 PM  Result Value Ref Range Status   Specimen Description   Final    BLOOD LEFT HAND Performed at [facility] [address], [place], [place] [address]    Special Requests   Final    BOTTLES DRAWN AEROBIC AND ANAEROBIC Blood Culture adequate volume Performed at [facility] [address] [place],  [place] [address]    Culture   Final    NO GROWTH 5 DAYS Performed at [facility], [place] [address], [place], [place] [address]    Report Status 04/18/2018 FINAL  Final  Culture, blood (Routine X 2) w Reflex to ID Panel     Status: None   Collection Time: 04/11/18  4:45 PM  Result Value Ref Range Status   Specimen Description   Final    BLOOD RIGHT HAND  Performed at [facility], [place] [address], [place], [place] [address]    Special Requests   Final    BOTTLES DRAWN AEROBIC AND ANAEROBIC Blood Culture adequate volume Performed at [facility] [address], [place], [place] [address]    Culture   Final    NO GROWTH 5 DAYS Performed at [facility], [place] [address], [place], [place] [address]    Report Status 04/18/2018 FINAL  Final  Aerobic/Anaerobic Culture (surgical/deep wound)     Status: None (Preliminary result)   Collection Time: 04/17/18  7:52 PM  Result Value Ref Range Status   Specimen Description   Final    SYNOVIAL LEFT HIP Performed at [facility] [address], [place], [place] [address]    Special Requests   Final    NONE Performed at [facility], [place] [address] [place], [place] [address]    Gram Stain   Final    FEW WBC PRESENT, PREDOMINANTLY PMN NO ORGANISMS SEEN    Culture   Final    NO GROWTH 2 DAYS NO ANAEROBES ISOLATED; CULTURE IN PROGRESS FOR 5 DAYS Performed at [facility] [address], [place], [place] [address]    Report Status PENDING  Incomplete         Radiology Studies: No results found.      Scheduled Meds: . amiodarone  400 mg Oral BID  . aspirin  81 mg Oral Daily  . Chlorhexidine Gluconate Cloth  6 each Topical Daily  . docusate sodium  100 mg Oral BID  . feeding supplement (PRO-STAT SUGAR FREE 64)  30 mL Oral BID  . ferrous sulfate  325 mg Oral BID WC  . hydrocortisone sodium succinate  50 mg Intravenous Q12H  . mouth rinse  15  mL Mouth Rinse BID  . midodrine  10 mg Oral TID WC  . multivitamin with minerals  1 tablet Oral Daily  . sodium chloride flush  10-40 mL Intracatheter Q12H   Continuous Infusions: . sodium chloride 10 mL/hr at 04/20/18 0413  .  ceFAZolin (ANCEF) IV Stopped (04/20/18 0618)     LOS: 11 days    Time spent: 35 minutes     Elmarie Shiley, MD [facility] Pager [phone]  If 7PM-7AM, please contact night-coverage [email or web address] Password [email or web address] 04/20/2018, 8:19 AM

## 2018-04-20 NOTE — Progress Notes (Addendum)
Progress Note  Patient Name: Elizabeth [name] Date of Encounter: 04/20/2018  Primary Cardiologist: Skeet Latch, MD   Subjective   Just worked with PT and is sitting on the bedside commode at the time of evaluation this morning. Feeling constipated but otherwise no complaints. Breathing is about the same as yesterday despite IV lasix. Denies chest pain.   After my evaluation, day RN informed me that patient had some hypotension overnight and was given 2L NS bolus' with improvement in BP. Unclear whether this can be attributed to lasix or whether pain medications played a roll. No documents overnight to verify events so unclear if she was symptomatic.  Inpatient Medications    Scheduled Meds: . amiodarone  400 mg Oral BID  . aspirin  81 mg Oral Daily  . Chlorhexidine Gluconate Cloth  6 each Topical Daily  . docusate sodium  100 mg Oral BID  . feeding supplement (PRO-STAT SUGAR FREE 64)  30 mL Oral BID  . ferrous sulfate  325 mg Oral BID WC  . hydrocortisone sodium succinate  50 mg Intravenous Q12H  . mouth rinse  15 mL Mouth Rinse BID  . midodrine  10 mg Oral TID WC  . multivitamin with minerals  1 tablet Oral Daily  . sodium chloride flush  10-40 mL Intracatheter Q12H   Continuous Infusions: . sodium chloride 10 mL/hr at 04/20/18 0413  .  ceFAZolin (ANCEF) IV Stopped (04/20/18 0618)   PRN Meds: acetaminophen, HYDROcodone-acetaminophen, metoCLOPramide **OR** metoCLOPramide (REGLAN) injection, ondansetron **OR** ondansetron (ZOFRAN) IV, promethazine   Vital Signs    Vitals:   04/20/18 0440 04/20/18 0445 04/20/18 0700 04/20/18 0755  BP: (!) 125/48  (!) 125/48 (!) 129/44  Pulse: 72  66 68  Resp: 20  18 (!) 22  Temp:    98.3 F (36.8 C)  TempSrc:    Oral  SpO2: 97%  97% 96%  Weight:  125.4 kg    Height:        Intake/Output Summary (Last 24 hours) at 04/20/2018 0907 Last data filed at 04/20/2018 0800 Gross per 24 hour  Intake 1819.69 ml  Output 2100 ml  Net  -280.31 ml   Filed Weights   04/18/18 0500 04/19/18 0459 04/20/18 0445  Weight: 111.5 kg 124.6 kg 125.4 kg    Telemetry    Sinus rhythm with PACs, briefly tachycardic while working with PT - Personally Reviewed  Physical Exam   GEN: Sitting on bedside commode in no acute distress.   Neck: No JVD, no carotid bruits Cardiac: RRR, +murmur, no rubs or gallops.  Respiratory: decreased breath sounds at lung bases, no wheezes/ rales/ rhonchi GI: NABS, Soft, obese, nontender, non-distended  MS: No edema; No deformity. Neuro:  Nonfocal, moving all extremities spontaneously Psych: Normal affect   Labs    Chemistry Recent Labs  Lab 04/18/18 0851 04/19/18 0500 04/20/18 0407  NA 136 138 141  K 4.0 3.9 3.5  CL 103 104 104  CO2 26 28 27   GLUCOSE 190* 121* 118*  BUN 36* 30* 26*  CREATININE 0.95 0.78 0.78  CALCIUM 7.9* 7.9* 7.6*  GFRNONAA >60 >60 >60  GFRAA >60 >60 >60  ANIONGAP 7 6 10      Hematology Recent Labs  Lab 04/18/18 0851 04/19/18 0500 04/20/18 0407  WBC 15.1* 13.2* 12.4*  RBC 2.96* 2.68* 2.73*  2.73*  HGB 8.9* 8.1* 7.9*  HCT 29.5* 27.0* 27.3*  MCV 99.7 100.7* 100.0  MCH 30.1 30.2 28.9  MCHC 30.2 30.0 28.9*  RDW 16.9* 17.4* 18.1*  PLT 69* 71* 75*    Cardiac Enzymes Recent Labs  Lab 04/16/18 2320 04/17/18 0503 04/17/18 1105  TROPONINI 0.05* 0.05* 0.06*   No results for input(s): TROPIPOC in the last 168 hours.   BNPNo results for input(s): BNP, PROBNP in the last 168 hours.   DDimer No results for input(s): DDIMER in the last 168 hours.   Radiology    No results found.  Cardiac Studies   Echocardiogram 04/12/18: Study Conclusions  - Left ventricle: The cavity size was normal. Systolic function was normal. The estimated ejection fraction was in the range of 60% to 65%. Wall motion was normal; there were no regional wall motion abnormalities. - Aortic valve: Trileaflet; severely thickened, severely calcified leaflets. 1.5 x 2 cm  calcified aortic mass noted (possible old calcified vegetation). This is new when compared to prior echocardiogram. Valve mobility was restricted. There was mild regurgitation. - Mitral valve: There was moderate regurgitation. - Tricuspid valve: There was moderate regurgitation.  Transesophageal Echocardiogram 04/17/18: Findings: Please see echo section for full report. Normal LV size and wall thickness. EF 55-60% with normal wall motion. Normal right ventricular size and systolic function. Moderate left atrial enlargement, no LA appendage thrombus. Normal right atrium. No PFO or ASD by color doppler. Mild TR, peak RV-RA gradient 45 mmHg. No TV vegetation. There was moderate central mitral regurgitation. ERO by PISA was only 0.14 cm^2 but visually 3+ MR. There did not appear to be a mitral valve vegetation. No systolic flow reversal in the pulmonary vein doppler pattern. The aortic valve was trileaflet with a large vegetation that appeared to involve all leaflets. There did appear to be be perforation. There appeared to be severe, eccentric aortic insufficiency with holodiastolic flow reversal in the ascending thoracic aorta. Mean gradient across the aortic valve was 32 mmHg, suggesting moderate aortic stenosis (elevated gradient was likely due to a combination of high flow from severe AI and leaflet impingement from bulky vegetation.   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 3+ mitral regurgitation without vegetation noted.   Patient Profile     67 y.o.femalewith past medical history of hypertension, hyperlipidemia, previous lung cancer status post lobectomyandmild aortic stenosis.Patient admitted December 8 with left hip pain, fever and hypotension. She is being treated for septic arthritis.Cardiology consulted for SVT.Also found to have an aortic mass noted on TTE (possible old  calcified vegetation).  Assessment & Plan    1. Aortic valve endocarditis:TTE with evidence of calcified vegetation. TEE confirmed aortic valve endocarditis with severe eccentric AI and perforation. She is currently being maintained on IV antibiotics for septic arthritis with Staph lugdunensis bacteremia. BCx as of 04/11/18 with NGTD. She underwent wash out/debridement of left hip 04/17/18 with NGTD from synovial fluid. Ultimately will need CT surgery evaluation to determine candidacy for aortic valve replacement. Will need to undergo a cardiac catheterization for coronary artery evaluation prior to possible AVR. - Recommend completion of antibiotics prior to undergoing cardiac catheterization in an effort to minimize risk of dislodging the vegetation and causing an embolic event.   2. Recurrent TGG:YIRS by EP this admission and started on IV amiodarone for SVT/atrial tachycardia. Could be considered for EPS/Ablation in the future if rhythm difficult to control.Transitioned to po amiodarone 04/18/18 - Continue amiodarone 400mg  BID x2 weeks, then taper to 400mg  daily x2 weeks, then 200mg  daily.    3. Septic arthritis:patient presented with L hip pain  following a steroid injection. Found to have sepsis/ staph lugdunensis bacteremia. Underwent left hip wash out/debridement with ortho 04/17/18 with drain in place. ID following. On IV cefazolin per sensitivities.  - Continue management per primary team, ID, and ortho  4. Volume overload: patient with some SOB and has been requiring O2 via [place]. CXR 12/15 with mild pulmonary edema. She is up 15L this admission. She was given IV lasix 40mg  x1 04/19/18 with UOP net -55mL in the past 24 hours. Still requiring O2 via [place] and reports increased SOB when attempting to wean. Cr stable at 0.78 today. Apparently with some hypotension overnight and she was given 2L NS. Given hypotension occurred so far removed from lasix dose, doubt this is the culprit. Possibly  related to pain medication. BP stable this morning.  - Will give IV lasix 40mg  again today in an effort to wean off O2 - Monitor strict I&Os and daily weights  5. Acute on chronic anemia: Hgb continues to trickle down. Likely iron deficiency anemia. Started on po iron supplements today.  - Continue to monitor closely   For questions or updates, please contact [place] Please consult [email or web address] for contact info under Cardiology/STEMI.      Signed, Abigail Butts, PA-C  04/20/2018, 9:07 AM   [phone]  I have seen and examined the patient along with Abigail Butts, PA-C .  I have reviewed the chart, notes and new data.  I agree with PA's note.  Key new complaints: dyspnea, edema persist. Unaware of brief atrial tachycardia (maybe very brief atrial fibrillation) that occurred earlier today (was on commode at the time) Key examination changes: grossly edematous, clear lungs Key new findings / data: worsening anemia, likely multifactorial, commonly seen with endocarditis, also has Fe deficiency. WBC trending down.  PLAN: Agree that she needs diuretics.  Avoid anticoagulants (other than DVT/PE prophylaxis), unless she has clear and sustained atrial fibrillation.  Sanda Klein, MD, [place] [phone] 04/20/2018, 2:19 PM

## 2018-04-20 NOTE — Progress Notes (Signed)
Pt. Had a short moment of bradycardia in the 40's and it looks like she was trying to go into A. Flutter. During this time pt. Was using the bedside commode and straining when I asked. PA Kroeger was notified and looked over medications and doesn't feel concerned. RN will continue to monitor pt. Closely especially when pt. Is using the bedside commode.

## 2018-04-20 NOTE — Progress Notes (Signed)
Physical Therapy Treatment Patient Details Name: Elizabeth [name] MRN: [id number] DOB: 07-30-1950 Today's Date: 04/20/2018    History of Present Illness 67 yo female s/p R TKA 8/19, admitted 12/16 with septic arthritis of left hip, s/P I and D on 04/17/18.TEE 12/16.    PT Comments    Required extensive assist to rise from [facility] while using STEDY. Recliner brought up behind patient. Continues with slow progress.   Follow Up Recommendations  SNF     Equipment Recommendations  None recommended by PT    Recommendations for Other Services       Precautions / Restrictions Precautions Precautions: Fall;Knee Precaution Comments: left ant. hip incision    Mobility  Transfers Overall transfer level: Needs assistance Equipment used:    Coy Saunas         General transfer comment: Charlaine Dalton used. Much assist to rise from the [facility] at [facility] Transferred to recliner with stedy.  Ambulation/Gait                 Stairs             Wheelchair Mobility    Modified Rankin (Stroke Patients Only)       Balance                                            Cognition Arousal/Alertness: Awake/alert Behavior During Therapy: Flat affect                                   General Comments: much encouragement to mobilize, make efforts to mobilize      Exercises      General Comments        Pertinent Vitals/Pain Faces Pain Scale: Hurts little more Pain Location: left hip with pressure Pain Descriptors / Indicators: Aching Pain Intervention(s): Monitored during session    Home Living                      Prior Function            PT Goals (current goals can now be found in the care plan section) Progress towards PT goals: Progressing toward goals    Frequency    Min 2X/week      PT Plan Current plan remains appropriate;Frequency needs to be updated    Co-evaluation              AM-PAC PT "6 Clicks"  Mobility   Outcome Measure  Help needed turning from your back to your side while in a flat bed without using bedrails?: Total Help needed moving from lying on your back to sitting on the side of a flat bed without using bedrails?: Total Help needed moving to and from a bed to a chair (including a wheelchair)?: Total Help needed standing up from a chair using your arms (e.g., wheelchair or bedside chair)?: Total Help needed to walk in hospital room?: Total Help needed climbing 3-5 steps with a railing? : Total 6 Click Score: 6    End of Session Equipment Utilized During Treatment: Gait belt Activity Tolerance: Patient limited by pain;Patient limited by fatigue Patient left: with call bell/phone within reach;with nursing/sitter in room;in chair Nurse Communication: Mobility status;Need for lift equipment(maxisky) PT Visit Diagnosis: Unsteadiness on feet (R26.81)  Time: 0029-8473 PT Time Calculation (min) (ACUTE ONLY): 13 min  Charges:  $Therapeutic Activity: 8-22 mins                     Tresa Endo PT Acute Rehabilitation Services Pager [phone] Office [phone]    Claretha Cooper 04/20/2018, 12:55 PM

## 2018-04-20 NOTE — Progress Notes (Signed)
[place] for Infectious Disease   Reason for visit: Follow up on bacteremia  Interval History: no new issues, plan to hold off on cath for now to avoid potential complications with vegetation,  No associated rash or diarrhea, no sob.     Day 12 total antibiotics Day 11 cefazolin  Physical Exam: Constitutional:  Vitals:   04/20/18 0755 04/20/18 1245  BP: (!) 129/44 (!) 121/25  Pulse: 68 68  Resp: (!) 22 17  Temp: 98.3 F (36.8 C) (!) 97.5 F (36.4 C)  SpO2: 96% 99%   patient appears in NAD Eyes: anicteric Respiratory: Normal respiratory effort; CTA B Cardiovascular: RRR GI: soft, nt, nd  Review of Systems: Constitutional: negative for fevers and chills Gastrointestinal: negative for nausea and diarrhea Integument/breast: negative for rash  Lab Results  Component Value Date   WBC 12.4 (H) 04/20/2018   HGB 7.9 (L) 04/20/2018   HCT 27.3 (L) 04/20/2018   MCV 100.0 04/20/2018   PLT 75 (L) 04/20/2018    Lab Results  Component Value Date   CREATININE 0.78 04/20/2018   BUN 26 (H) 04/20/2018   NA 141 04/20/2018   K 3.5 04/20/2018   CL 104 04/20/2018   CO2 27 04/20/2018    Lab Results  Component Value Date   ALT 13 04/11/2018   AST 25 04/11/2018   ALKPHOS 53 04/11/2018     Microbiology: Recent Results (from the past 240 hour(s))  Body fluid culture     Status: None   Collection Time: 04/10/18  5:15 PM  Result Value Ref Range Status   Specimen Description   Final    SYNOVIAL Performed at [facility], [place] [address], [place], [place] [address]    Special Requests   Final    HIP Performed at [facility], [place] [address], [place], [place] [address]    Gram Stain   Final    RARE WBC PRESENT,BOTH PMN AND MONONUCLEAR NO ORGANISMS SEEN    Culture   Final    RARE STAPHYLOCOCCUS LUGDUNENSIS CRITICAL RESULT CALLED TO, READ BACK BY AND VERIFIED WITH: RN Jerre Simon [phone] AT 29 AM BY CN Performed at [facility], [place] [address] [place], [place] [address]    Report Status 04/14/2018 FINAL  Final   Organism ID, Bacteria STAPHYLOCOCCUS LUGDUNENSIS  Final      Susceptibility   Staphylococcus lugdunensis - MIC*    CIPROFLOXACIN <=0.5 SENSITIVE Sensitive     ERYTHROMYCIN >=8 RESISTANT Resistant     GENTAMICIN <=0.5 SENSITIVE Sensitive     OXACILLIN 0.5 SENSITIVE Sensitive     TETRACYCLINE <=1 SENSITIVE Sensitive     VANCOMYCIN <=0.5 SENSITIVE Sensitive     TRIMETH/SULFA <=10 SENSITIVE Sensitive     CLINDAMYCIN RESISTANT Resistant     RIFAMPIN <=0.5 SENSITIVE Sensitive     Inducible Clindamycin POSITIVE Resistant     * RARE STAPHYLOCOCCUS LUGDUNENSIS  Culture, blood (Routine X 2) w Reflex to ID Panel     Status: None   Collection Time: 04/11/18  4:38 PM  Result Value Ref Range Status   Specimen Description   Final    BLOOD LEFT HAND Performed at [facility] [address], [place], [place] [address]    Special Requests   Final    BOTTLES DRAWN AEROBIC AND ANAEROBIC Blood Culture adequate volume Performed at [facility] [address] [place], [place] [address]    Culture  Final    NO GROWTH 5 DAYS Performed at [facility], [place] [address] [place], [place] [address]    Report Status 04/18/2018 FINAL  Final  Culture, blood (Routine X 2) w Reflex to ID Panel     Status: None   Collection Time: 04/11/18  4:45 PM  Result Value Ref Range Status   Specimen Description   Final    BLOOD RIGHT HAND Performed at [facility] [address] [place], [place] [address]    Special Requests   Final    BOTTLES DRAWN AEROBIC AND ANAEROBIC Blood Culture adequate volume Performed at [facility] [address], [place], [place] [address]    Culture   Final    NO GROWTH 5 DAYS Performed at [facility], [place] [address] [place], [place] [address]    Report Status 04/18/2018 FINAL  Final  Aerobic/Anaerobic  Culture (surgical/deep wound)     Status: None (Preliminary result)   Collection Time: 04/17/18  7:52 PM  Result Value Ref Range Status   Specimen Description   Final    SYNOVIAL LEFT HIP Performed at [facility] [address], [place], [place] [address]    Special Requests   Final    NONE Performed at [facility], [place] [address] [place], [place] [address]    Gram Stain   Final    FEW WBC PRESENT, PREDOMINANTLY PMN NO ORGANISMS SEEN    Culture   Final    NO GROWTH 3 DAYS NO ANAEROBES ISOLATED; CULTURE IN PROGRESS FOR 5 DAYS Performed at [facility] [address], [place],  [address]    Report Status PENDING  Incomplete    Impression/Plan:  1. Staph lugdunensis - oxicillin sensitive.  On cefazolin.  Repeat cultures remain ngtd.  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  Would need nafcillin if she develops CNS emboli.    3.  Septic arthritis - no growth on operative cultures.  No changes.   I will continue to follow intermittently as needed.

## 2018-04-20 NOTE — Anesthesia Postprocedure Evaluation (Signed)
Anesthesia Post Note  Patient: Elizabeth [name]  Procedure(s) Performed: IRRIGATION AND DEBRIDEMENT LEFT HIP (ANTERIOR) (Left Hip)     Patient location during evaluation: PACU Anesthesia Type: General Level of consciousness: awake and alert Pain management: pain level controlled Vital Signs Assessment: post-procedure vital signs reviewed and stable Respiratory status: spontaneous breathing, nonlabored ventilation, respiratory function stable and patient connected to nasal cannula oxygen Cardiovascular status: blood pressure returned to baseline and stable Postop Assessment: no apparent nausea or vomiting Anesthetic complications: no    Last Vitals:  Vitals:   04/20/18 0700 04/20/18 0755  BP: (!) 125/48 (!) 129/44  Pulse: 66 68  Resp: 18 (!) 22  Temp:  36.8 C  SpO2: 97% 96%    Last Pain:  Vitals:   04/20/18 0755  TempSrc: Oral  PainSc: Asleep                 [name] S

## 2018-04-21 LAB — CBC
HEMATOCRIT: 25.9 % — AB (ref 36.0–46.0)
HEMOGLOBIN: 7.7 g/dL — AB (ref 12.0–15.0)
MCH: 30.1 pg (ref 26.0–34.0)
MCHC: 29.7 g/dL — ABNORMAL LOW (ref 30.0–36.0)
MCV: 101.2 fL — ABNORMAL HIGH (ref 80.0–100.0)
Platelets: 74 10*3/uL — ABNORMAL LOW (ref 150–400)
RBC: 2.56 MIL/uL — ABNORMAL LOW (ref 3.87–5.11)
RDW: 17.7 % — ABNORMAL HIGH (ref 11.5–15.5)
WBC: 10.7 10*3/uL — ABNORMAL HIGH (ref 4.0–10.5)
nRBC: 0 % (ref 0.0–0.2)

## 2018-04-21 LAB — BASIC METABOLIC PANEL
Anion gap: 9 (ref 5–15)
BUN: 22 mg/dL (ref 8–23)
CO2: 31 mmol/L (ref 22–32)
Calcium: 7.8 mg/dL — ABNORMAL LOW (ref 8.9–10.3)
Chloride: 101 mmol/L (ref 98–111)
Creatinine, Ser: 0.7 mg/dL (ref 0.44–1.00)
GFR calc non Af Amer: >60 mL/min (ref >60)
Glucose, Bld: 133 mg/dL — ABNORMAL HIGH (ref 70–99)
Potassium: 3.8 mmol/L (ref 3.5–5.1)
SODIUM: 141 mmol/L (ref 135–145)

## 2018-04-21 LAB — PREPARE RBC (CROSSMATCH)

## 2018-04-21 MED ORDER — FUROSEMIDE 10 MG/ML IJ SOLN
20.0000 mg | Freq: Once | INTRAMUSCULAR | Status: AC
  Filled 2018-04-21: qty 2

## 2018-04-21 MED ORDER — FUROSEMIDE 10 MG/ML IJ SOLN
20.0000 mg | Freq: Two times a day (BID) | INTRAMUSCULAR | Status: DC
  Administered 2018-04-21: 20 mg via INTRAVENOUS
  Filled 2018-04-21: qty 2

## 2018-04-21 MED ORDER — SODIUM CHLORIDE 0.9% IV SOLUTION
Freq: Once | INTRAVENOUS | Status: AC
  Administered 2018-04-21: 11:00:00 via INTRAVENOUS

## 2018-04-21 MED ORDER — POTASSIUM CHLORIDE CRYS ER 20 MEQ PO TBCR
40.0000 meq | EXTENDED_RELEASE_TABLET | Freq: Every day | ORAL | Status: DC
  Administered 2018-04-21 – 2018-04-22 (×2): 40 meq via ORAL
  Filled 2018-04-21 (×2): qty 2

## 2018-04-21 MED ORDER — FUROSEMIDE 10 MG/ML IJ SOLN
20.0000 mg | Freq: Two times a day (BID) | INTRAMUSCULAR | Status: DC
  Administered 2018-04-21: 20 mg via INTRAVENOUS

## 2018-04-21 NOTE — Progress Notes (Addendum)
Progress Note  Patient Name: Elizabeth [name] Date of Encounter: 04/21/2018  Primary Cardiologist: Skeet Latch, MD   Subjective   Pt feels pretty well right now, denies chest pain. No SOB at rest.    Inpatient Medications    Scheduled Meds: . amiodarone  400 mg Oral BID  . aspirin  81 mg Oral Daily  . Chlorhexidine Gluconate Cloth  6 each Topical Daily  . docusate sodium  100 mg Oral BID  . feeding supplement (PRO-STAT SUGAR FREE 64)  30 mL Oral BID  . ferrous sulfate  325 mg Oral BID WC  . hydrocortisone sodium succinate  50 mg Intravenous Q12H  . mouth rinse  15 mL Mouth Rinse BID  . midodrine  10 mg Oral TID WC  . multivitamin with minerals  1 tablet Oral Daily  . sodium chloride flush  10-40 mL Intracatheter Q12H   Continuous Infusions: . sodium chloride 10 mL/hr at 04/20/18 0413  .  ceFAZolin (ANCEF) IV Stopped (04/21/18 0547)   PRN Meds: acetaminophen, HYDROcodone-acetaminophen, metoCLOPramide **OR** metoCLOPramide (REGLAN) injection, ondansetron **OR** ondansetron (ZOFRAN) IV, promethazine   Vital Signs    Vitals:   04/21/18 0200 04/21/18 0300 04/21/18 0400 04/21/18 0500  BP: (!) 128/52 (!) 125/46 (!) 124/49 (!) 123/51  Pulse: 63 61 60 (!) 59  Resp: 16 17 17 17   Temp:   98.4 F (36.9 C)   TempSrc:   Oral   SpO2: 96% 96% 98% 98%  Weight:   125.4 kg   Height:        Intake/Output Summary (Last 24 hours) at 04/21/2018 0801 Last data filed at 04/21/2018 0500 Gross per 24 hour  Intake 107.76 ml  Output 1350 ml  Net -1242.24 ml   Filed Weights   04/19/18 0459 04/20/18 0445 04/21/18 0400  Weight: 124.6 kg 125.4 kg 125.4 kg    Telemetry    SR, 1st deg AVB - Personally Reviewed  Physical Exam   General: Well developed, well nourished, female in no acute distress Head: Eyes PERRLA, No xanthomas.   Normocephalic and atraumatic Lungs: Few rales bases. Heart: HRRR S1 S2, without RG. 2/6 SEM  Pulses are 2+ & equal. No JVD seen, difficult to assess  2nd body habitus. Abdomen: Bowel sounds are present, abdomen soft and non-tender without masses or  hernias noted. Msk: Normal strength and tone for age. Extremities: No clubbing, cyanosis, edema noted to both arms/hands, trace LE edema Skin:  No rashes or lesions noted. Neuro: Alert and oriented X 3. Psych:  Good affect, responds appropriately  Labs    Chemistry Recent Labs  Lab 04/19/18 0500 04/20/18 0407 04/21/18 0300  NA 138 141 141  K 3.9 3.5 3.8  CL 104 104 101  CO2 28 27 31   GLUCOSE 121* 118* 133*  BUN 30* 26* 22  CREATININE 0.78 0.78 0.70  CALCIUM 7.9* 7.6* 7.8*  GFRNONAA >60 >60 >60  GFRAA >60 >60 >60  ANIONGAP 6 10 9      Hematology Recent Labs  Lab 04/19/18 0500 04/20/18 0407 04/21/18 0300  WBC 13.2* 12.4* 10.7*  RBC 2.68* 2.73*  2.73* 2.56*  HGB 8.1* 7.9* 7.7*  HCT 27.0* 27.3* 25.9*  MCV 100.7* 100.0 101.2*  MCH 30.2 28.9 30.1  MCHC 30.0 28.9* 29.7*  RDW 17.4* 18.1* 17.7*  PLT 71* 75* 74*    Cardiac Enzymes Recent Labs  Lab 04/16/18 2320 04/17/18 0503 04/17/18 1105  TROPONINI 0.05* 0.05* 0.06*    Radiology    No  results found.  Cardiac Studies   Echocardiogram 04/12/18: Study Conclusions  - Left ventricle: The cavity size was normal. Systolic function was normal. The estimated ejection fraction was in the range of 60% to 65%. Wall motion was normal; there were no regional wall motion abnormalities. - Aortic valve: Trileaflet; severely thickened, severely calcified leaflets. 1.5 x 2 cm calcified aortic mass noted (possible old calcified vegetation). This is new when compared to prior echocardiogram. Valve mobility was restricted. There was mild regurgitation. - Mitral valve: There was moderate regurgitation. - Tricuspid valve: There was moderate regurgitation.  Transesophageal Echocardiogram 04/17/18: Findings: Please see echo section for full report. Normal LV size and wall thickness. EF 55-60% with normal wall  motion. Normal right ventricular size and systolic function. Moderate left atrial enlargement, no LA appendage thrombus. Normal right atrium. No PFO or ASD by color doppler. Mild TR, peak RV-RA gradient 45 mmHg. No TV vegetation. There was moderate central mitral regurgitation. ERO by PISA was only 0.14 cm^2 but visually 3+ MR. There did not appear to be a mitral valve vegetation. No systolic flow reversal in the pulmonary vein doppler pattern. The aortic valve was trileaflet with a large vegetation that appeared to involve all leaflets. There did appear to be be perforation. There appeared to be severe, eccentric aortic insufficiency with holodiastolic flow reversal in the ascending thoracic aorta. Mean gradient across the aortic valve was 32 mmHg, suggesting moderate aortic stenosis (elevated gradient was likely due to a combination of high flow from severe AI and leaflet impingement from bulky vegetation.   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 3+ mitral regurgitation without vegetation noted.   Patient Profile     67 y.o.femalewith past medical history of hypertension, hyperlipidemia, previous lung cancer status post lobectomyandmild aortic stenosis.Patient admitted December 8 with left hip pain, fever and hypotension. Dx septic arthritis.Cardiology consulted for SVT.Also found to have an aortic mass noted on TTE (possible old calcified vegetation).  Assessment & Plan    1. Aortic valve endocarditis: -See 2D echo and TEE reports above. - Aortic valve endocarditis, severe eccentric area and perforation seen. - Treatment for septic arthritis with staph lugdunenesis (rare) seen in synovial fluid from 12/9, but blood cultures are negative so far. - Follow, once antibiotic treatment is completed, discuss cardiac catheterization and CT surgery evaluation.  - for ABX x 4 weeks, will arrange appt  in January  2. Recurrent SVT: - none in > 24 hr - Dr. Lovena Le saw 12/13, patient was on IV amiodarone for both SVT and atrial tach>> p.o. amiodarone 12/17 - Amiodarone 400 mg bid x2 weeks>> 400 mg qd x2 weeks>>200 mg qd -Can follow-up after discharge to consider ablation  3. Septic arthritis: -Per ID/Ortho/IM - s/p Debridement and washout 12/16 - Cefazolin per MDs  4. Volume overload:  -Diastolic dysfunction not mentioned on echo report - Admission weight 245 pounds, now 276 pounds -Intake/output +14.6 L since admission - Has been requiring O2, got Lasix 40 mg IV 12/15, 12/18, 12/19 -However, she got a liter of saline for hypotension on 12/19, plus an additional 500 cc - still w/ volume overload, will give Lasix 20 mg IV BID, hopefully BP will tolerate, on midodrine 10 mg tid  5. Acute on chronic anemia:  -Blood counts continue to trend downward -Started on iron 12/19 -Per IM  For questions or updates, please contact [place] Please consult [email or web address] for contact info under Cardiology/STEMI.  Signed, Rosaria Ferries, PA-C  04/21/2018, 8:01 AM   [phone]   I have seen and examined the patient along with Rosaria Ferries, PA-C .  I have reviewed the chart, notes and new data.  I agree with PA/NP's note.  Key new complaints: Denies any dyspnea at rest, but still sleeps with the head of the bed elevated.  Major complaint is generalized fatigue.  No syncope/presyncope.  Grossly edematous. Key examination changes: All 4 extremities are swollen.  Has anasarca. Key new findings / data: Hemoglobin 7.7.  Most recent albumin which was checked on December 10 was only 2.2.  Urinalysis from December 8 did not show proteinuria.  PLAN: Receiving a blood transfusion today.  Suspect she has even worsened hypoalbuminemia compared to the already severely decreased level from more than a week ago.  This is probably contributing to her generalized edema.  Suggest nutrition  consult.  Continue efforts to diurese.  Sanda Klein, MD, [place] [phone] 04/21/2018, 1:59 PM

## 2018-04-21 NOTE — Progress Notes (Signed)
LCSW provided patient with SNF list.   Patient will discuss with daughter and make bed choice.   LCSW left message for patient's daughter to update on bed offers.   LCSW will continue to follow.   Carolin Coy [facility]

## 2018-04-21 NOTE — Progress Notes (Signed)
PROGRESS NOTE    Elizabeth [name]  QVZ:[id number] DOB: 10/20/1950 DOA: 04/09/2018 PCP: Ann Held, DO   Brief Narrative:  67 year old with past medical history significant for osteoarthritis of the left hip who underwent an a steroid injection on the same on 11/11 (per IR) who developed left hip pain on 11/14.  Patient was admitted to [facility] long hospital on 12/09 with septic shock, found to have bacteremia with a staph lugdunesis.  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  Cardiology and ID has been helping with patient's care.  Patient also found to have left hip septic arthritis, she underwent irrigation and debridement by Dr. Maureen Ralphs  on 04/18/2018.  Patient also hospital course complicated by SVT, now on amiodarone.  Volume overload, persistent hypotension on midodrine.     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 -Off IV pressor.  CCM sign off. -On [facility] service since 12/16. -Continue on Midodrine 3 times daily. -TEE ; aortic valve endocarditis with severe eccentric AI and perforation. -Need to finish IV antibiotics before heart cath and CT surgery evaluation. -Patient will need 6 weeks of IV antibiotics until Mar 23, 202019 -White blood cell trending  down. -Hypotension last night, unclear if related to pain medication vs lasix. Decrease pain medication to one tablet.  BP improved this am.  Continue with hydrocortisone BID. Plan to change it to daily 12-21.  Left hip septic arthritis, -Status post left hip irrigation and debridement and drain placement on 12/16.. -Drain removed 12/18. -synovial fluid no growth to date.   Thrombocytopenia; the related to acute infection process. Stable.  Recurrent SVT/atrial tachycardia: She was initially on amiodarone drip.  Transition to oral amiodarone on 12/17 Cardiology following..  Acute diastolic heart failure exacerbation Patient with pulmonary edema, volume overload. Lasix per cardiology. She was able to tolerate lasix 12-19  Anemia of chronic disease; iron deficiency; Hemoglobin baseline around 9. Started ferrous sulfate.  Hb trending down, will transfuse one unit PRBC>   Hypertension; Continue to hold Benicar and hydrochlorothiazide Patient currently on a steroid stress dose and midodrine.   H/O lung cancer resected 2010.  Morbid Obesity.     RN Pressure Injury Documentation: Pressure Injury 04/09/18 Stage II -  Partial thickness loss of dermis presenting as a shallow open ulcer with a red, pink wound bed without slough. (Active)  04/09/18 2230  Location: Sacrum  Location Orientation: Right  Staging: Stage II -  Partial thickness loss of dermis presenting as a shallow open ulcer with a red, pink wound bed without slough.  Wound Description (Comments):   Present on Admission: Yes     Pressure Injury 04/09/18 Stage II -  Partial thickness loss of dermis presenting as a shallow open ulcer with a red, pink wound bed without slough. (Active)  04/09/18 2230  Location:  Sacrum  Location Orientation: Left  Staging: Stage II -  Partial thickness loss of dermis presenting as a shallow open ulcer with a red, pink wound bed without slough.  Wound Description (Comments):   Present on  Admission: Yes    Malnutrition Type:  Nutrition Problem: Increased nutrient needs Etiology: acute illness, wound healing   Malnutrition Characteristics:  Signs/Symptoms: estimated needs   Nutrition Interventions:  Interventions: Prostat, MVI  Estimated body mass index is 47.45 kg/m as calculated from the following:   Height as of this encounter: 5\' 4"  (1.626 m).   Weight as of this encounter: 125.4 kg.   DVT prophylaxis: Eliquis Code Status: Full code Family Communication: Care discussed with patient Disposition Plan: She will need a skilled nursing facility, social worker consulted.   Consultants:   Cardiology  ID  CCM sign off  Orthopedic, Dr Elmyra Ricks   Procedures:   TEE on 12/15  Left hip washout with drain placement on 12/15   Antimicrobials:   Ancef 12-09   Subjective: Report dyspnea.  Complaining of hip pain.    Objective: Vitals:   04/21/18 0800 04/21/18 1200 04/21/18 1237 04/21/18 1302  BP: (!) 135/36 (!) 129/43 (!) 132/43 (!) 130/35  Pulse: 62 62 66 66  Resp: (!) 21 18 (!) 23 (!) 28  Temp: 98.3 F (36.8 C) 97.8 F (36.6 C) 97.8 F (36.6 C) 97.9 F (36.6 C)  TempSrc: Oral Oral Oral Oral  SpO2: 96% 100% 96% 96%  Weight:      Height:        Intake/Output Summary (Last 24 hours) at 04/21/2018 1351 Last data filed at 04/21/2018 1302 Gross per 24 hour  Intake 332.07 ml  Output 1150 ml  Net -817.93 ml   Filed Weights   04/19/18 0459 04/20/18 0445 04/21/18 0400  Weight: 124.6 kg 125.4 kg 125.4 kg    Examination:  General exam: NAD Respiratory system: Bilateral crackles.  Cardiovascular system: S 1, S 2 RRR Gastrointestinal system: BS present, soft, nt Central nervous system: alert and oriented.  Extremities: trace edema Skin: Left hip with clean dressing  Psychiatry: flat affect    Data Reviewed: I have personally reviewed following labs and imaging studies  CBC: Recent Labs  Lab 04/15/18 0500 04/16/18 0403  04/18/18 0851 04/19/18 0500 04/20/18 0407 04/21/18 0300  WBC 22.5* 17.8* 15.1* 13.2* 12.4* 10.7*  NEUTROABS 18.6* 14.6*  --  10.5*  --   --   HGB 9.5* 9.3* 8.9* 8.1* 7.9* 7.7*  HCT 30.5* 30.0* 29.5* 27.0* 27.3* 25.9*  MCV 95.0 97.7 99.7 100.7* 100.0 101.2*  PLT 76* 64* 69* 71* 75* 74*   Basic Metabolic Panel: Recent Labs  Lab 04/16/18 0403 04/18/18 0851 04/19/18 0500 04/20/18 0407 04/21/18 0300  NA 137 136 138 141 141  K 4.6 4.0 3.9 3.5 3.8  CL 104 103 104 104 101  CO2 27 26 28 27 31   GLUCOSE 150* 190* 121* 118* 133*  BUN 32* 36* 30* 26* 22  CREATININE 0.93 0.95 0.78 0.78 0.70  CALCIUM 7.9* 7.9* 7.9* 7.6* 7.8*  MG  --  2.4  --   --   --    GFR: Estimated Creatinine Clearance: 88.2 mL/min (by C-G formula based on SCr of 0.7 mg/dL). Liver Function Tests: No results for input(s): AST, ALT, ALKPHOS, BILITOT, PROT, ALBUMIN in the last 168 hours. No results for input(s): LIPASE, AMYLASE in the last 168 hours. No results for input(s): AMMONIA in the last 168 hours. Coagulation Profile: No  results for input(s): INR, PROTIME in the last 168 hours. Cardiac Enzymes: Recent Labs  Lab 04/16/18 2320 04/17/18 0503 04/17/18 1105  TROPONINI 0.05* 0.05* 0.06*   BNP (last 3 results) No results for input(s): PROBNP in the last 8760 hours. HbA1C: No results for input(s): HGBA1C in the last 72 hours. CBG: No results for input(s): GLUCAP in the last 168 hours. Lipid Profile: No results for input(s): CHOL, HDL, LDLCALC, TRIG, CHOLHDL, LDLDIRECT in the last 72 hours. Thyroid Function Tests: No results for input(s): TSH, T4TOTAL, FREET4, T3FREE, THYROIDAB in the last 72 hours. Anemia Panel: Recent Labs    04/20/18 0407  VITAMINB12 507  FOLATE 10.8  FERRITIN 128  TIBC 273  IRON 16*  RETICCTPCT 9.9*   Sepsis Labs: Recent Labs  Lab 04/15/18 0500 04/16/18 0403  PROCALCITON 0.18 0.13    Recent Results (from the past 240 hour(s))  Culture, blood (Routine X 2) w Reflex to  ID Panel     Status: None   Collection Time: 04/11/18  4:38 PM  Result Value Ref Range Status   Specimen Description   Final    BLOOD LEFT HAND Performed at [facility] [address] [place], [place] [address]    Special Requests   Final    BOTTLES DRAWN AEROBIC AND ANAEROBIC Blood Culture adequate volume Performed at [facility] [address], [place], [place] [address]    Culture   Final    NO GROWTH 5 DAYS Performed at [facility], [place] [address], [place], [place] [address]    Report Status 04/18/2018 FINAL  Final  Culture, blood (Routine X 2) w Reflex to ID Panel     Status: None   Collection Time: 04/11/18  4:45 PM  Result Value Ref Range Status   Specimen Description   Final    BLOOD RIGHT HAND Performed at [facility] [address], [place], [place] [address]    Special Requests   Final    BOTTLES DRAWN AEROBIC AND ANAEROBIC Blood Culture adequate volume Performed at [facility] [address], [place], [place] [address]    Culture   Final    NO GROWTH 5 DAYS Performed at [facility], [place] [address], [place], [place] [address]    Report Status 04/18/2018 FINAL  Final  Aerobic/Anaerobic Culture (surgical/deep wound)     Status: None (Preliminary result)   Collection Time: 04/17/18  7:52 PM  Result Value Ref Range Status   Specimen Description   Final    SYNOVIAL LEFT HIP Performed at [facility] [address] [place], [place] [address]    Special Requests   Final    NONE Performed at [facility], [place] [address], [place], [place] [address]    Gram Stain   Final    FEW WBC PRESENT, PREDOMINANTLY PMN NO ORGANISMS SEEN    Culture   Final    NO GROWTH 4 DAYS NO ANAEROBES ISOLATED; CULTURE IN PROGRESS FOR 5 DAYS Performed at [facility] [address], [place],  [address]    Report Status PENDING  Incomplete           Radiology Studies: No results found.      Scheduled Meds: . amiodarone  400 mg Oral BID  . aspirin  81 mg Oral Daily  . Chlorhexidine Gluconate Cloth  6 each Topical Daily  . docusate sodium  100 mg  Oral BID  . feeding supplement (PRO-STAT SUGAR FREE 64)  30 mL Oral BID  . ferrous sulfate  325 mg Oral BID WC  . furosemide  20 mg Intravenous Once  . furosemide  20 mg Intravenous BID  . hydrocortisone sodium succinate  50 mg Intravenous Q12H  . mouth rinse  15 mL Mouth Rinse BID  . midodrine  10 mg Oral TID WC  . multivitamin with minerals  1 tablet Oral Daily  . potassium chloride  40 mEq Oral Daily  . sodium chloride flush  10-40 mL Intracatheter Q12H   Continuous Infusions: . sodium chloride 10 mL/hr at 04/20/18 0413  .  ceFAZolin (ANCEF) IV Stopped (04/21/18 0547)     LOS: 12 days    Time spent: 35 minutes     Elmarie Shiley, MD [facility] Pager [phone]  If 7PM-7AM, please contact night-coverage [email or web address] Password [email or web address] 04/21/2018, 1:51 PM

## 2018-04-21 NOTE — Care Management Note (Signed)
Case Management Note  Patient Details  Name: Elizabeth [name] MRN: [id number] Date of Birth: 07-13-1950  Subjective/Objective:                  Subjective: 4 Days Post-Op Procedure(s) (LRB): IRRIGATION AND DEBRIDEMENT LEFT HIP (ANTERIOR) (Left) Patient reports pain as mild.   Patient seen in rounds for Dr. Wynelle Link. Patient is well this AM, states fatigue is slowly improving. Got up with therapy yesterday to the [facility] and recliner.  Action/Plan: New dressing applied. Continue working with therapy.  Disposition: SNF once medically stable.   Expected Discharge Date:                  Expected Discharge Plan:  Skilled Nursing Facility  In-House Referral:  Clinical Social Work  Discharge planning Services  CM Consult  Post Acute Care Choice:    Choice offered to:     DME Arranged:    DME Agency:     HH Arranged:    [facility] Agency:     Status of Service:  In process, will continue to follow  If discussed at Long Length of Stay Meetings, dates discussed:    Additional Comments:  Leeroy Cha, RN 04/21/2018, 9:58 AM

## 2018-04-21 NOTE — Progress Notes (Signed)
   Subjective: 4 Days Post-Op Procedure(s) (LRB): IRRIGATION AND DEBRIDEMENT LEFT HIP (ANTERIOR) (Left) Patient reports pain as mild.   Patient seen in rounds for Dr. Wynelle Link. Patient is well this AM, states fatigue is slowly improving. Got up with therapy yesterday to the [facility] and recliner.  Objective: Vital signs in last 24 hours: Temp:  [97.5 F (36.4 C)-98.8 F (37.1 C)] 98.4 F (36.9 C) (12/20 0400) Pulse Rate:  [59-69] 59 (12/20 0500) Resp:  [16-23] 17 (12/20 0500) BP: (121-134)/(25-52) 123/51 (12/20 0500) SpO2:  [95 %-100 %] 98 % (12/20 0500) Weight:  [125.4 kg] 125.4 kg (12/20 0400)  Intake/Output from previous day:  Intake/Output Summary (Last 24 hours) at 04/21/2018 0746 Last data filed at 04/21/2018 0500 Gross per 24 hour  Intake 207.76 ml  Output 1350 ml  Net -1142.24 ml    Labs: Recent Labs    04/18/18 0851 04/19/18 0500 04/20/18 0407 04/21/18 0300  HGB 8.9* 8.1* 7.9* 7.7*   Recent Labs    04/20/18 0407 04/21/18 0300  WBC 12.4* 10.7*  RBC 2.73*  2.73* 2.56*  HCT 27.3* 25.9*  PLT 75* 74*   Recent Labs    04/20/18 0407 04/21/18 0300  NA 141 141  K 3.5 3.8  CL 104 101  CO2 27 31  BUN 26* 22  CREATININE 0.78 0.70  GLUCOSE 118* 133*  CALCIUM 7.6* 7.8*   Exam: General - Patient is Alert and Oriented Extremity - Neurologically intact Neurovascular intact Sensation intact distally Dorsiflexion/Plantar flexion intact Dressing/Incision - healing well, moderate serosanguinous drainage on dressing.  Motor Function - intact, moving foot and toes well on exam.   Past Medical History:  Diagnosis Date  . Aortic stenosis, mild 01/08/2015  . Dyspnea    with rest   . Dysrhythmia   . Hx of cancer of lung 6/10   Removed June 2010  . Hyperlipidemia   . Hypertension   . Low back pain   . Morbid obesity ([place]) 01/08/2015  . Right foot injury    report she sustained a fracture to her right , present with ortho boot on affected extremity   . 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   Weight as of this encounter: 125.4 kg. Up with therapy  DVT Prophylaxis - Aspirin Weight-bearing as tolerated  New dressing applied. Continue working with therapy.  Disposition: SNF once medically stable.   Theresa Duty, PA-C Orthopedic Surgery 04/21/2018, 7:46 AM

## 2018-04-21 NOTE — Progress Notes (Signed)
PHARMACY CONSULT NOTE FOR:  OUTPATIENT  PARENTERAL ANTIBIOTIC THERAPY (OPAT)  Indication: endocarditis and septic joint Regimen: cefazolin 2gm q8h End date: 10-Jun-2018  IV antibiotic discharge orders are pended. To discharging provider:  please sign these orders via discharge navigator,  Select New Orders & click on the button choice - Manage This Unsigned Work.     Thank you for allowing pharmacy to be a part of this patient's care.  Doreene Eland, PharmD, BCPS.   Work Cell: [phone] 04/21/2018 10:19 AM

## 2018-04-21 NOTE — Progress Notes (Addendum)
    [facility] for Infectious Disease   Reason for visit: Follow up on IE, septic arthritis  Interval History:no changes  Physical Exam: Constitutional:  Vitals:   04/21/18 0500 04/21/18 0800  BP: (!) 123/51 (!) 135/36  Pulse: (!) 59 62  Resp: 17 (!) 21  Temp:    SpO2: 98% 96%   patient appears in NAD  Impression/Plan: Stable IE, SA.  Plan for cath at the end of antibiotic treatment.  Cefazolin through January 26th (6 weeks).  I have arranged follow up with me in January.   OPAT

## 2018-04-22 DIAGNOSIS — D638 Anemia in other chronic diseases classified elsewhere: Secondary | ICD-10-CM

## 2018-04-22 DIAGNOSIS — R601 Generalized edema: Secondary | ICD-10-CM

## 2018-04-22 LAB — CBC
HCT: 29.2 % — ABNORMAL LOW (ref 36.0–46.0)
Hemoglobin: 8.8 g/dL — ABNORMAL LOW (ref 12.0–15.0)
MCH: 29.6 pg (ref 26.0–34.0)
MCHC: 30.1 g/dL (ref 30.0–36.0)
MCV: 98.3 fL (ref 80.0–100.0)
NRBC: 0 % (ref 0.0–0.2)
Platelets: 87 10*3/uL — ABNORMAL LOW (ref 150–400)
RBC: 2.97 MIL/uL — AB (ref 3.87–5.11)
RDW: 17.8 % — ABNORMAL HIGH (ref 11.5–15.5)
WBC: 10.7 10*3/uL — ABNORMAL HIGH (ref 4.0–10.5)

## 2018-04-22 LAB — BASIC METABOLIC PANEL
Anion gap: 7 (ref 5–15)
BUN: 23 mg/dL (ref 8–23)
CO2: 31 mmol/L (ref 22–32)
Calcium: 7.9 mg/dL — ABNORMAL LOW (ref 8.9–10.3)
Chloride: 102 mmol/L (ref 98–111)
Creatinine, Ser: 0.67 mg/dL (ref 0.44–1.00)
GFR calc non Af Amer: >60 mL/min (ref >60)
Glucose, Bld: 94 mg/dL (ref 70–99)
Potassium: 3.7 mmol/L (ref 3.5–5.1)
Sodium: 140 mmol/L (ref 135–145)

## 2018-04-22 LAB — TYPE AND SCREEN
ABO/RH(D): O POS
ANTIBODY SCREEN: NEGATIVE
Unit division: 0

## 2018-04-22 LAB — AEROBIC/ANAEROBIC CULTURE W GRAM STAIN (SURGICAL/DEEP WOUND)

## 2018-04-22 LAB — BPAM RBC
Blood Product Expiration Date: 202001172359
ISSUE DATE / TIME: 201912201241
UNIT TYPE AND RH: 5100

## 2018-04-22 LAB — AEROBIC/ANAEROBIC CULTURE (SURGICAL/DEEP WOUND): CULTURE: NO GROWTH

## 2018-04-22 MED ORDER — FUROSEMIDE 10 MG/ML IJ SOLN
40.0000 mg | Freq: Two times a day (BID) | INTRAMUSCULAR | Status: DC
  Administered 2018-04-22 – 2018-04-25 (×8): 40 mg via INTRAVENOUS
  Filled 2018-04-22 (×8): qty 4

## 2018-04-22 MED ORDER — FUROSEMIDE 10 MG/ML IJ SOLN: 40.0000 mg | Freq: Two times a day (BID) | INTRAMUSCULAR | Status: DC

## 2018-04-22 MED ORDER — HYDROCORTISONE NA SUCCINATE PF 100 MG IJ SOLR
50.0000 mg | Freq: Every day | INTRAMUSCULAR | Status: DC
  Administered 2018-04-22 – 2018-04-23 (×2): 50 mg via INTRAVENOUS
  Filled 2018-04-22 (×2): qty 2

## 2018-04-22 NOTE — Progress Notes (Signed)
Progress Note  Patient Name: Elizabeth [name] Date of Encounter: 04/22/2018  Primary Cardiologist: Skeet Latch, MD   Subjective   Remains +13L since ad mission - negative about 1L overnight. Low albumin, infection and poor fed state contributing to anasarca. BP stable overnight - on midodrine.  Inpatient Medications    Scheduled Meds: . amiodarone  400 mg Oral BID  . aspirin  81 mg Oral Daily  . Chlorhexidine Gluconate Cloth  6 each Topical Daily  . docusate sodium  100 mg Oral BID  . feeding supplement (PRO-STAT SUGAR FREE 64)  30 mL Oral BID  . ferrous sulfate  325 mg Oral BID WC  . furosemide  20 mg Intravenous BID  . hydrocortisone sodium succinate  50 mg Intravenous Daily  . mouth rinse  15 mL Mouth Rinse BID  . midodrine  10 mg Oral TID WC  . multivitamin with minerals  1 tablet Oral Daily  . potassium chloride  40 mEq Oral Daily  . sodium chloride flush  10-40 mL Intracatheter Q12H   Continuous Infusions: . sodium chloride 10 mL/hr at 04/20/18 0413  .  ceFAZolin (ANCEF) IV 2 g (04/22/18 0626)   PRN Meds: acetaminophen, HYDROcodone-acetaminophen, metoCLOPramide **OR** metoCLOPramide (REGLAN) injection, ondansetron **OR** ondansetron (ZOFRAN) IV, promethazine   Vital Signs    Vitals:   04/22/18 0400 04/22/18 0500 04/22/18 0600 04/22/18 0800  BP: (!) 128/42 (!) 118/36 (!) 113/33   Pulse: 81 81 78   Resp: (!) 32 (!) 32 (!) 25   Temp:    98.6 F (37 C)  TempSrc:    Oral  SpO2: 91% (!) 89% 94%   Weight:  124.2 kg    Height:        Intake/Output Summary (Last 24 hours) at 04/22/2018 0848 Last data filed at 04/21/2018 2000 Gross per 24 hour  Intake 483.29 ml  Output 1550 ml  Net -1066.71 ml   Filed Weights   04/20/18 0445 04/21/18 0400 04/22/18 0500  Weight: 125.4 kg 125.4 kg 124.2 kg    Telemetry    Sinus rhythm - personally reviewed  Physical Exam   General appearance: alert, no distress and morbidly obese Neck: no carotid bruit, no JVD and  thyroid not enlarged, symmetric, no tenderness/mass/nodules Lungs: diminished breath sounds bilaterally Heart: regular rate and rhythm, S1, S2 normal and diastolic murmur: holodiastolic 3/6, blowing at lower left sternal border Abdomen: soft, non-tender; bowel sounds normal; no masses,  no organomegaly and obese Extremities: edema 2+ edema Pulses: 2+ and symmetric Skin: Skin color, texture, turgor normal. No rashes or lesions Neurologic: Grossly normal Psych: Pleasant   Labs    Chemistry Recent Labs  Lab 04/19/18 0500 04/20/18 0407 04/21/18 0300  NA 138 141 141  K 3.9 3.5 3.8  CL 104 104 101  CO2 28 27 31   GLUCOSE 121* 118* 133*  BUN 30* 26* 22  CREATININE 0.78 0.78 0.70  CALCIUM 7.9* 7.6* 7.8*  GFRNONAA >60 >60 >60  GFRAA >60 >60 >60  ANIONGAP 6 10 9      Hematology Recent Labs  Lab 04/19/18 0500 04/20/18 0407 04/21/18 0300  WBC 13.2* 12.4* 10.7*  RBC 2.68* 2.73*  2.73* 2.56*  HGB 8.1* 7.9* 7.7*  HCT 27.0* 27.3* 25.9*  MCV 100.7* 100.0 101.2*  MCH 30.2 28.9 30.1  MCHC 30.0 28.9* 29.7*  RDW 17.4* 18.1* 17.7*  PLT 71* 75* 74*    Cardiac Enzymes Recent Labs  Lab 04/16/18 2320 04/17/18 0503 04/17/18 1105  TROPONINI  0.05* 0.05* 0.06*    Radiology    No results found.  Cardiac Studies   Echocardiogram 04/12/18: Study Conclusions  - Left ventricle: The cavity size was normal. Systolic function was normal. The estimated ejection fraction was in the range of 60% to 65%. Wall motion was normal; there were no regional wall motion abnormalities. - Aortic valve: Trileaflet; severely thickened, severely calcified leaflets. 1.5 x 2 cm calcified aortic mass noted (possible old calcified vegetation). This is new when compared to prior echocardiogram. Valve mobility was restricted. There was mild regurgitation. - Mitral valve: There was moderate regurgitation. - Tricuspid valve: There was moderate regurgitation.  Transesophageal  Echocardiogram 04/17/18: Findings: Please see echo section for full report. Normal LV size and wall thickness. EF 55-60% with normal wall motion. Normal right ventricular size and systolic function. Moderate left atrial enlargement, no LA appendage thrombus. Normal right atrium. No PFO or ASD by color doppler. Mild TR, peak RV-RA gradient 45 mmHg. No TV vegetation. There was moderate central mitral regurgitation. ERO by PISA was only 0.14 cm^2 but visually 3+ MR. There did not appear to be a mitral valve vegetation. No systolic flow reversal in the pulmonary vein doppler pattern. The aortic valve was trileaflet with a large vegetation that appeared to involve all leaflets. There did appear to be be perforation. There appeared to be severe, eccentric aortic insufficiency with holodiastolic flow reversal in the ascending thoracic aorta. Mean gradient across the aortic valve was 32 mmHg, suggesting moderate aortic stenosis (elevated gradient was likely due to a combination of high flow from severe AI and leaflet impingement from bulky vegetation.   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 3+ mitral regurgitation without vegetation noted.   Patient Profile     67 y.o.femalewith past medical history of hypertension, hyperlipidemia, previous lung cancer status post lobectomyandmild aortic stenosis.Patient admitted December 8 with left hip pain, fever and hypotension. Dx septic arthritis.Cardiology consulted for SVT.Also found to have an aortic mass noted on TTE (possible old calcified vegetation).  Assessment & Plan    1. Aortic valve endocarditis: -See 2D echo and TEE reports above. - Aortic valve endocarditis, severe eccentric area and perforation seen. - Treatment for septic arthritis with staph lugdunenesis (rare) seen in synovial fluid from 12/9, but blood cultures are negative so far. - Follow,  once antibiotic treatment is completed, discuss cardiac catheterization and CT surgery evaluation.  - for ABX x 4 weeks, will arrange appt in January  2. Recurrent SVT: - none in > 24 hr - Dr. Lovena Le saw 12/13, patient was on IV amiodarone for both SVT and atrial tach>> p.o. amiodarone 12/17 - Amiodarone 400 mg bid x2 weeks>> 400 mg qd x2 weeks>>200 mg qd -Can follow-up after discharge to consider ablation  3. Septic arthritis: -Per ID/Ortho/IM - s/p Debridement and washout 12/16 - Cefazolin per MDs  4. Volume overload:  -Diastolic dysfunction not mentioned on echo report - Admission weight 245 pounds, now 276 pounds -Intake/output +14.6 L since admission - Has been requiring O2, got Lasix 40 mg IV 12/15, 12/18, 12/19 -However, she got a liter of saline for hypotension on 12/19, plus an additional 500 cc - increase lasix to 40 mg IV BID  5. Acute on chronic anemia:  -Blood counts continue to trend downward -Started on iron 12/19 -Per IM  For questions or updates, please contact [place] Please consult [email or web address] for contact info under Cardiology/STEMI.   Chrissie Noa  C. Debara Pickett, MD, [facility], [place] Director of the [facility] Lipid Disorders &  Cardiovascular Risk Reduction Clinic Diplomate of the American Board of Clinical Lipidology Attending Cardiologist  Direct Dial: [phone]  Fax: [phone]  Website:  [email or web address]  Pixie Casino, MD  04/22/2018, 8:48 AM   [phone]

## 2018-04-22 NOTE — Progress Notes (Signed)
    Subjective:  Patient reports pain as mild.  Denies N/V/CP/SOB.   Objective:   VITALS:   Vitals:   04/22/18 0400 04/22/18 0500 04/22/18 0600 04/22/18 0800  BP: (!) 128/42 (!) 118/36 (!) 113/33 (!) 128/42  Pulse: 81 81 78 73  Resp: (!) 32 (!) 32 (!) 25 18  Temp:    98.6 F (37 C)  TempSrc:    Oral  SpO2: 91% (!) 89% 94% 96%  Weight:  124.2 kg    Height:        NAD ABD soft Sensation intact distally Intact pulses distally Dorsiflexion/Plantar flexion intact Incision: dressing with mild dried ss drainage, dressing changed.   Lab Results  Component Value Date   WBC 10.7 (H) 04/21/2018   HGB 7.7 (L) 04/21/2018   HCT 25.9 (L) 04/21/2018   MCV 101.2 (H) 04/21/2018   PLT 74 (L) 04/21/2018   BMET    Component Value Date/Time   NA 140 04/22/2018 0540   NA 143 01/05/2017 1131   K 3.7 04/22/2018 0540   K 4.4 01/05/2017 1131   CL 102 04/22/2018 0540   CL 104 10/02/2012 0949   CO2 31 04/22/2018 0540   CO2 31 (H) 01/05/2017 1131   GLUCOSE 94 04/22/2018 0540   GLUCOSE 99 01/05/2017 1131   GLUCOSE 104 (H) 10/02/2012 0949   BUN 23 04/22/2018 0540   BUN 10.2 01/05/2017 1131   CREATININE 0.67 04/22/2018 0540   CREATININE 0.83 11/11/2017 1636   CREATININE 0.9 01/05/2017 1131   CALCIUM 7.9 (L) 04/22/2018 0540   CALCIUM 10.2 01/05/2017 1131   GFRNONAA >60 04/22/2018 0540   GFRAA >60 04/22/2018 0540     Assessment/Plan: 5 Days Post-Op   Active Problems:   Left hip pain   Nonrheumatic aortic insufficiency with aortic stenosis   Obesity, Class III, BMI 40-49.9 (morbid obesity) ([place])   Septic arthritis ([place])   Sepsis with acute organ dysfunction and septic shock (HCC)   Pressure injury of skin   Chest tightness   Fever   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

## 2018-04-22 NOTE — Progress Notes (Signed)
PROGRESS NOTE    Elizabeth [name]  JIR:[id number] DOB: February 07, 1951 DOA: 04/09/2018 PCP: Ann Held, DO   Brief Narrative:  67 year old with past medical history significant for osteoarthritis of the left hip who underwent an a steroid injection on the same on 11/11 (per IR) who developed left hip pain on 11/14.  Patient was admitted to [facility] on 12/09 with septic shock, found to have bacteremia with a staph lugdunesis.  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  Cardiology and ID has been helping with patient's care.  Patient also found to have left hip septic arthritis, she underwent irrigation and debridement by Dr. Maureen Ralphs  on 04/18/2018.  Patient also hospital course complicated by SVT, now on amiodarone.  Volume overload, persistent hypotension on midodrine.     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 -Off IV pressor.  CCM sign off. -On [facility] service since 12/16. -Continue on Midodrine 3 times daily. -TEE ; aortic valve endocarditis with severe eccentric AI and perforation. -Need to finish IV antibiotics before heart cath and CT surgery evaluation. -Patient will need 6 weeks of IV antibiotics until 02/11/202019 -White blood cell trending  down. -change hydrocortisone to daily.  -BP has remain stable.    Left hip septic arthritis, -Status post left hip irrigation and debridement and drain placement on 12/16.. -Drain removed 12/18. -synovial fluid no growth to date.   Thrombocytopenia; the related to acute infection process. Stable.  Recurrent SVT/atrial tachycardia: She was initially on amiodarone drip.  Transition to oral amiodarone on 12/17 Cardiology following..  Acute diastolic heart failure exacerbation Patient with pulmonary edema, volume overload. Tolerating lasix 20 mg IV BID>   Anemia of chronic disease; iron deficiency; Hemoglobin baseline around 9. Started ferrous sulfate.  Transfuse one unit PRBC 12-20.hb up to 8.8 post transfusion.   Hypertension; Continue to hold Benicar and hydrochlorothiazide Patient currently on a steroid stress dose and midodrine.   H/O lung cancer resected 2010.  Morbid Obesity.     RN Pressure Injury Documentation: Pressure Injury 04/09/18 Stage II -  Partial thickness loss of dermis presenting as a shallow open ulcer with a red, pink wound bed without slough. (Active)  04/09/18 2230  Location: Sacrum  Location Orientation: Right  Staging: Stage II -  Partial thickness loss of dermis presenting as a shallow open ulcer with a red, pink wound bed without slough.  Wound Description (Comments):   Present on Admission: Yes     Pressure Injury 04/09/18 Stage II -  Partial thickness loss of dermis presenting as a shallow open ulcer with a red, pink wound bed without slough. (Active)  04/09/18 2230  Location: Sacrum  Location Orientation: Left  Staging: Stage II -  Partial thickness loss of dermis presenting as a shallow open ulcer with a  red, pink wound bed without slough.  Wound Description (Comments):   Present on Admission: Yes    Malnutrition Type:  Nutrition Problem: Increased nutrient needs Etiology: acute illness, wound healing   Malnutrition  Characteristics:  Signs/Symptoms: estimated needs   Nutrition Interventions:  Interventions: Prostat, MVI  Estimated body mass index is 47 kg/m as calculated from the following:   Height as of this encounter: 5\' 4"  (1.626 m).   Weight as of this encounter: 124.2 kg.   DVT prophylaxis: Eliquis Code Status: Full code Family Communication: Care discussed with patient Disposition Plan: She will need a skilled nursing facility, social worker consulted. Remain in the hospital for IV lasix, still with volume overload and to monitor BP on lasix.   Consultants:   Cardiology  ID  CCM sign off  Orthopedic, Dr Elmyra Ricks   Procedures:   TEE on 12/15  Left hip washout with drain placement on 12/15   Antimicrobials:   Ancef 12-09   Subjective: Breathing better, feeling better    Objective: Vitals:   04/22/18 0351 04/22/18 0400 04/22/18 0500 04/22/18 0600  BP:  (!) 128/42 (!) 118/36 (!) 113/33  Pulse:  81 81 78  Resp:  (!) 32 (!) 32 (!) 25  Temp: 99.8 F (37.7 C)     TempSrc: Oral     SpO2:  91% (!) 89% 94%  Weight:   124.2 kg   Height:        Intake/Output Summary (Last 24 hours) at 04/22/2018 0824 Last data filed at 04/21/2018 2000 Gross per 24 hour  Intake 483.29 ml  Output 1550 ml  Net -1066.71 ml   Filed Weights   04/20/18 0445 04/21/18 0400 04/22/18 0500  Weight: 125.4 kg 125.4 kg 124.2 kg    Examination:  General exam: NAD Respiratory system: Bilateral crackles.  Cardiovascular system: S 1, S 2 IRR Gastrointestinal system: BS soft, nt Central nervous system: Alert. .  Extremities: trace edema Skin: left hip with clean dressing.  Psychiatry: mood improved.    Data Reviewed: I have personally reviewed following labs and imaging studies  CBC: Recent Labs  Lab 04/16/18 0403 04/18/18 0851 04/19/18 0500 04/20/18 0407 04/21/18 0300  WBC 17.8* 15.1* 13.2* 12.4* 10.7*  NEUTROABS 14.6*  --  10.5*  --   --   HGB 9.3* 8.9* 8.1* 7.9* 7.7*   HCT 30.0* 29.5* 27.0* 27.3* 25.9*  MCV 97.7 99.7 100.7* 100.0 101.2*  PLT 64* 69* 71* 75* 74*   Basic Metabolic Panel: Recent Labs  Lab 04/16/18 0403 04/18/18 0851 04/19/18 0500 04/20/18 0407 04/21/18 0300  NA 137 136 138 141 141  K 4.6 4.0 3.9 3.5 3.8  CL 104 103 104 104 101  CO2 27 26 28 27 31   GLUCOSE 150* 190* 121* 118* 133*  BUN 32* 36* 30* 26* 22  CREATININE 0.93 0.95 0.78 0.78 0.70  CALCIUM 7.9* 7.9* 7.9* 7.6* 7.8*  MG  --  2.4  --   --   --    GFR: Estimated Creatinine Clearance: 87.7 mL/min (by C-G formula based on SCr of 0.7 mg/dL). Liver Function Tests: No results for input(s): AST, ALT, ALKPHOS, BILITOT, PROT, ALBUMIN in the last 168 hours. No results for input(s): LIPASE, AMYLASE in the last 168 hours. No results for input(s): AMMONIA in the last 168 hours. Coagulation Profile: No results for input(s): INR, PROTIME in the last 168 hours. Cardiac Enzymes: Recent Labs  Lab 04/16/18 2320 04/17/18 0503 04/17/18 1105  TROPONINI 0.05* 0.05* 0.06*  BNP (last 3 results) No results for input(s): PROBNP in the last 8760 hours. HbA1C: No results for input(s): HGBA1C in the last 72 hours. CBG: No results for input(s): GLUCAP in the last 168 hours. Lipid Profile: No results for input(s): CHOL, HDL, LDLCALC, TRIG, CHOLHDL, LDLDIRECT in the last 72 hours. Thyroid Function Tests: No results for input(s): TSH, T4TOTAL, FREET4, T3FREE, THYROIDAB in the last 72 hours. Anemia Panel: Recent Labs    04/20/18 0407  VITAMINB12 507  FOLATE 10.8  FERRITIN 128  TIBC 273  IRON 16*  RETICCTPCT 9.9*   Sepsis Labs: Recent Labs  Lab 04/16/18 0403  PROCALCITON 0.13    Recent Results (from the past 240 hour(s))  Aerobic/Anaerobic Culture (surgical/deep wound)     Status: None (Preliminary result)   Collection Time: 04/17/18  7:52 PM  Result Value Ref Range Status   Specimen Description   Final    SYNOVIAL LEFT HIP Performed at [facility] [address], [place], [place] [address]    Special Requests   Final    NONE Performed at [facility], [place] [address] [place], [place] [address]    Gram Stain   Final    FEW WBC PRESENT, PREDOMINANTLY PMN NO ORGANISMS SEEN    Culture   Final    NO GROWTH 4 DAYS NO ANAEROBES ISOLATED; CULTURE IN PROGRESS FOR 5 DAYS Performed at [facility] [address], [place], [place] [address]    Report Status PENDING  Incomplete         Radiology Studies: No results found.      Scheduled Meds: . amiodarone  400 mg Oral BID  . aspirin  81 mg Oral Daily  . Chlorhexidine Gluconate Cloth  6 each Topical Daily  . docusate sodium  100 mg Oral BID  . feeding supplement (PRO-STAT SUGAR FREE 64)  30 mL Oral BID  . ferrous sulfate  325 mg Oral BID WC  . furosemide  20 mg Intravenous BID  . hydrocortisone sodium succinate  50 mg Intravenous Daily  . mouth rinse  15 mL Mouth Rinse BID  . midodrine  10 mg Oral TID WC  . multivitamin with minerals  1 tablet Oral Daily  . potassium chloride  40 mEq Oral Daily  . sodium chloride flush  10-40 mL Intracatheter Q12H   Continuous Infusions: . sodium chloride 10 mL/hr at 04/20/18 0413  .  ceFAZolin (ANCEF) IV 2 g (04/22/18 0626)     LOS: 13 days    Time spent: 35 minutes     Elmarie Shiley, MD [facility] Pager [phone]  If 7PM-7AM, please contact night-coverage [email or web address] Password [facility] 04/22/2018, 8:24 AM

## 2018-04-23 LAB — BASIC METABOLIC PANEL
Anion gap: 10 (ref 5–15)
BUN: 25 mg/dL — AB (ref 8–23)
CO2: 31 mmol/L (ref 22–32)
Calcium: 7.9 mg/dL — ABNORMAL LOW (ref 8.9–10.3)
Chloride: 98 mmol/L (ref 98–111)
Creatinine, Ser: 0.82 mg/dL (ref 0.44–1.00)
GFR calc Af Amer: >60 mL/min (ref >60)
GFR calc non Af Amer: >60 mL/min (ref >60)
GLUCOSE: 108 mg/dL — AB (ref 70–99)
Potassium: 3.2 mmol/L — ABNORMAL LOW (ref 3.5–5.1)
Sodium: 139 mmol/L (ref 135–145)

## 2018-04-23 LAB — CBC
HCT: 28.6 % — ABNORMAL LOW (ref 36.0–46.0)
Hemoglobin: 8.6 g/dL — ABNORMAL LOW (ref 12.0–15.0)
MCH: 29.5 pg (ref 26.0–34.0)
MCHC: 30.1 g/dL (ref 30.0–36.0)
MCV: 97.9 fL (ref 80.0–100.0)
Platelets: 95 10*3/uL — ABNORMAL LOW (ref 150–400)
RBC: 2.92 MIL/uL — ABNORMAL LOW (ref 3.87–5.11)
RDW: 17.2 % — ABNORMAL HIGH (ref 11.5–15.5)
WBC: 10.5 10*3/uL (ref 4.0–10.5)
nRBC: 0 % (ref 0.0–0.2)

## 2018-04-23 MED ORDER — ORAL CARE MOUTH RINSE
15.0000 mL | Freq: Two times a day (BID) | OROMUCOSAL | Status: DC
  Administered 2018-04-24: 15 mL via OROMUCOSAL

## 2018-04-23 MED ORDER — POTASSIUM CHLORIDE CRYS ER 20 MEQ PO TBCR
40.0000 meq | EXTENDED_RELEASE_TABLET | Freq: Two times a day (BID) | ORAL | Status: AC
  Administered 2018-04-23 (×2): 40 meq via ORAL
  Filled 2018-04-23 (×2): qty 2

## 2018-04-23 NOTE — Progress Notes (Signed)
Progress Note  Patient Name: Elizabeth [name] Date of Encounter: 04/23/2018  Primary Cardiologist: Skeet Latch, MD   Subjective   Better diuresis overnight - negative 1.4L. Creatinine relatively stable with this. Hemoglobin has remained stable. On midodrine - blood pressure stable.  Inpatient Medications    Scheduled Meds: . amiodarone  400 mg Oral BID  . aspirin  81 mg Oral Daily  . Chlorhexidine Gluconate Cloth  6 each Topical Daily  . docusate sodium  100 mg Oral BID  . feeding supplement (PRO-STAT SUGAR FREE 64)  30 mL Oral BID  . ferrous sulfate  325 mg Oral BID WC  . furosemide  40 mg Intravenous BID  . hydrocortisone sodium succinate  50 mg Intravenous Daily  . mouth rinse  15 mL Mouth Rinse BID  . midodrine  10 mg Oral TID WC  . multivitamin with minerals  1 tablet Oral Daily  . potassium chloride  40 mEq Oral BID  . sodium chloride flush  10-40 mL Intracatheter Q12H   Continuous Infusions: . sodium chloride 10 mL/hr at 04/23/18 0800  .  ceFAZolin (ANCEF) IV Stopped (04/23/18 0554)   PRN Meds: acetaminophen, HYDROcodone-acetaminophen, metoCLOPramide **OR** metoCLOPramide (REGLAN) injection, ondansetron **OR** ondansetron (ZOFRAN) IV, promethazine   Vital Signs    Vitals:   04/23/18 0345 04/23/18 0400 04/23/18 0800 04/23/18 0826  BP:   (!) 115/21 (!) 127/49  Pulse:  71 70 80  Resp:  (!) 29 (!) 26 (!) 29  Temp: 97.9 F (36.6 C)  98.6 F (37 C)   TempSrc: Oral  Oral   SpO2:  97% 95% 96%  Weight: 122.8 kg     Height:        Intake/Output Summary (Last 24 hours) at 04/23/2018 0852 Last data filed at 04/23/2018 0800 Gross per 24 hour  Intake 1545.36 ml  Output 3250 ml  Net -1704.64 ml   Filed Weights   04/21/18 0400 04/22/18 0500 04/23/18 0345  Weight: 125.4 kg 124.2 kg 122.8 kg    Telemetry    Sinus rhythm with 1st degree AVB - personally reviewed  Physical Exam   General appearance: alert, no distress and morbidly obese Neck: no carotid  bruit, no JVD and thyroid not enlarged, symmetric, no tenderness/mass/nodules Lungs: diminished breath sounds bilaterally Heart: regular rate and rhythm, S1, S2 normal and diastolic murmur: holodiastolic 3/6, blowing at lower left sternal border Abdomen: soft, non-tender; bowel sounds normal; no masses,  no organomegaly and obese Extremities: edema 1-2+ Pulses: 2+ and symmetric Skin: Skin color, texture, turgor normal. No rashes or lesions Neurologic: Grossly normal Psych: Pleasant   Labs    Chemistry Recent Labs  Lab 04/21/18 0300 04/22/18 0540 04/23/18 0345  NA 141 140 139  K 3.8 3.7 3.2*  CL 101 102 98  CO2 31 31 31   GLUCOSE 133* 94 108*  BUN 22 23 25*  CREATININE 0.70 0.67 0.82  CALCIUM 7.8* 7.9* 7.9*  GFRNONAA >60 >60 >60  GFRAA >60 >60 >60  ANIONGAP 9 7 10      Hematology Recent Labs  Lab 04/21/18 0300 04/22/18 0912 04/23/18 0345  WBC 10.7* 10.7* 10.5  RBC 2.56* 2.97* 2.92*  HGB 7.7* 8.8* 8.6*  HCT 25.9* 29.2* 28.6*  MCV 101.2* 98.3 97.9  MCH 30.1 29.6 29.5  MCHC 29.7* 30.1 30.1  RDW 17.7* 17.8* 17.2*  PLT 74* 87* 95*    Cardiac Enzymes Recent Labs  Lab 04/16/18 2320 04/17/18 0503 04/17/18 1105  TROPONINI 0.05* 0.05* 0.06*  Radiology    No results found.  Cardiac Studies   Echocardiogram 04/12/18: Study Conclusions  - Left ventricle: The cavity size was normal. Systolic function was normal. The estimated ejection fraction was in the range of 60% to 65%. Wall motion was normal; there were no regional wall motion abnormalities. - Aortic valve: Trileaflet; severely thickened, severely calcified leaflets. 1.5 x 2 cm calcified aortic mass noted (possible old calcified vegetation). This is new when compared to prior echocardiogram. Valve mobility was restricted. There was mild regurgitation. - Mitral valve: There was moderate regurgitation. - Tricuspid valve: There was moderate regurgitation.  Transesophageal  Echocardiogram 04/17/18: Findings: Please see echo section for full report. Normal LV size and wall thickness. EF 55-60% with normal wall motion. Normal right ventricular size and systolic function. Moderate left atrial enlargement, no LA appendage thrombus. Normal right atrium. No PFO or ASD by color doppler. Mild TR, peak RV-RA gradient 45 mmHg. No TV vegetation. There was moderate central mitral regurgitation. ERO by PISA was only 0.14 cm^2 but visually 3+ MR. There did not appear to be a mitral valve vegetation. No systolic flow reversal in the pulmonary vein doppler pattern. The aortic valve was trileaflet with a large vegetation that appeared to involve all leaflets. There did appear to be be perforation. There appeared to be severe, eccentric aortic insufficiency with holodiastolic flow reversal in the ascending thoracic aorta. Mean gradient across the aortic valve was 32 mmHg, suggesting moderate aortic stenosis (elevated gradient was likely due to a combination of high flow from severe AI and leaflet impingement from bulky vegetation.   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 3+ mitral regurgitation without vegetation noted.   Patient Profile     67 y.o.femalewith past medical history of hypertension, hyperlipidemia, previous lung cancer status post lobectomyandmild aortic stenosis.Patient admitted December 8 with left hip pain, fever and hypotension. Dx septic arthritis.Cardiology consulted for SVT.Also found to have an aortic mass noted on TTE (possible old calcified vegetation).  Assessment & Plan    1. Aortic valve endocarditis: -See 2D echo and TEE reports above. - Aortic valve endocarditis, severe eccentric area and perforation seen. - Treatment for septic arthritis with staph lugdunenesis (rare) seen in synovial fluid from 12/9, but blood cultures are negative so far. - Follow,  once antibiotic treatment is completed, discuss cardiac catheterization and CT surgery evaluation.  - for ABX x 4 weeks, will arrange appt in January  2. Recurrent SVT: - none in > 24 hr - Dr. Lovena Le saw 12/13, patient was on IV amiodarone for both SVT and atrial tach>> p.o. amiodarone 12/17 - Amiodarone 400 mg bid x2 weeks>> 400 mg qd x2 weeks>>200 mg qd -Can follow-up after discharge to consider ablation  3. Septic arthritis: -Per ID/Ortho/IM - s/p Debridement and washout 12/16 - Cefazolin per MDs  4. Volume overload:  -Diastolic dysfunction not mentioned on echo report - Admission weight 245 pounds, now 276 pounds -Intake/output +14.6 L since admission - Has been requiring O2, got Lasix 40 mg IV 12/15, 12/18, 12/19 -However, she got a liter of saline for hypotension on 12/19, plus an additional 500 cc - Good diuresis overnight - creatinine stable. Continue IV BID lasix  5. Acute on chronic anemia:  -Blood counts continue to trend downward -Started on iron 12/19 -Per IM  For questions or updates, please contact [place] Please consult [email or web address] for contact info under Cardiology/STEMI.   Pixie Casino, MD,  FACC, [place] Director of the [facility] Diplomate of the American Board of Clinical Lipidology Attending Cardiologist  Direct Dial: [phone]  Fax: [phone]  Website:  [email or web address]  Pixie Casino, MD  04/23/2018, 8:52 AM   [phone]

## 2018-04-23 NOTE — Progress Notes (Signed)
PROGRESS NOTE    MANE CONSOLO  XNA:[id number] DOB: November 05, 1950 DOA: 04/09/2018 PCP: Ann Held, DO   Brief Narrative:  67 year old with past medical history significant for osteoarthritis of the left hip who underwent an a steroid injection on the same on 11/11 (per IR) who developed left hip pain on 11/14.  Patient was admitted to [facility] long hospital on 12/09 with septic shock, found to have bacteremia with a staph lugdunesis.  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  Cardiology and ID has been helping with patient's care.  Patient also found to have left hip septic arthritis, she underwent irrigation and debridement by Dr. Maureen Ralphs  on 04/18/2018.  Patient also hospital course complicated by SVT, now on amiodarone.  Volume overload, persistent hypotension on midodrine.     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 -Off IV pressor.  CCM sign off. -On [facility] service since 12/16. -Continue on Midodrine 3 times daily. -TEE ; aortic valve endocarditis with severe eccentric AI and perforation. -Need to finish IV antibiotics before heart cath and CT surgery evaluation. -Patient will need 6 weeks of IV antibiotics until 19-Feb-202019 -White blood cell trending  down. -hydrocortisone to daily.  -BP has remain stable.    Acute diastolic heart failure exacerbation Patient with pulmonary edema, volume overload. Increase  lasix 40 mg IV BID>  3 L urine out put yesterday.   Hypokalemia; kcl 40 BID>   Left hip septic arthritis, -Status post left hip irrigation and debridement and drain placement on 12/16.. -Drain removed 12/18. -synovial fluid no growth to date.   Thrombocytopenia; the related to acute infection process. Stable.  Recurrent SVT/atrial tachycardia: She was initially on amiodarone drip.  Transition to oral amiodarone on 12/17 Cardiology following..  Anemia of chronic disease; iron deficiency; Hemoglobin baseline around 9. Started ferrous sulfate.  Transfuse one unit PRBC 12-20.hb up to 8.8 post transfusion.  Hb stable.   Hypertension; Continue to hold Benicar and hydrochlorothiazide Patient currently on a steroid stress dose and midodrine.   H/O lung cancer resected 2010.  Morbid Obesity.     RN Pressure Injury Documentation: Pressure Injury 04/09/18 Stage II -  Partial thickness loss of dermis presenting as a shallow open ulcer with a red, pink wound bed without slough. (Active)  04/09/18 2230  Location: Sacrum  Location Orientation: Right  Staging: Stage II -  Partial thickness loss of dermis presenting as a shallow open ulcer with a red, pink wound bed without slough.  Wound Description (Comments):   Present on Admission: Yes     Pressure Injury 04/09/18 Stage II -  Partial thickness loss of dermis presenting as a shallow open ulcer with a red, pink wound bed without slough. (Active)  04/09/18 2230  Location: Sacrum  Location Orientation: Left  Staging: Stage  II -  Partial thickness loss of dermis presenting as a shallow open ulcer with a red, pink wound bed without slough.  Wound Description (Comments):   Present on Admission: Yes    Malnutrition Type:  Nutrition Problem: Increased nutrient  needs Etiology: acute illness, wound healing   Malnutrition Characteristics:  Signs/Symptoms: estimated needs   Nutrition Interventions:  Interventions: Prostat, MVI  Estimated body mass index is 46.47 kg/m as calculated from the following:   Height as of this encounter: 5\' 4"  (1.626 m).   Weight as of this encounter: 122.8 kg.   DVT prophylaxis: Eliquis Code Status: Full code Family Communication: Care discussed with patient Disposition Plan: She will need a skilled nursing facility, social worker consulted. Remain in the hospital for IV lasix, still with volume overload and to monitor BP on lasix.   Consultants:   Cardiology  ID  CCM sign off  Orthopedic, Dr Elmyra Ricks   Procedures:   TEE on 12/15  Left hip washout with drain placement on 12/15   Antimicrobials:   Ancef 12-09   Subjective: Denies worsening dyspnea.  Denies chest pain   Objective: Vitals:   04/23/18 0800 04/23/18 0826 04/23/18 1000 04/23/18 1200  BP: (!) 115/21 (!) 127/49 (!) 129/41 (!) 119/40  Pulse: 70 80 73 70  Resp: (!) 26 (!) 29 (!) 22 (!) 22  Temp: 98.6 F (37 C)     TempSrc: Oral     SpO2: 95% 96% 95% 95%  Weight:      Height:        Intake/Output Summary (Last 24 hours) at 04/23/2018 1345 Last data filed at 04/23/2018 1300 Gross per 24 hour  Intake 1475.33 ml  Output 3700 ml  Net -2224.67 ml   Filed Weights   04/21/18 0400 04/22/18 0500 04/23/18 0345  Weight: 125.4 kg 124.2 kg 122.8 kg    Examination:  General exam: NAD Respiratory system: Bilateral crackles.  Cardiovascular system: S 1, S 2 RRR Gastrointestinal system: BS present, soft, nt Central nervous system: Alert.  Extremities: Trace edema Skin: left hip clean dressing.  Psychiatry: mood improved.    Data Reviewed: I have personally reviewed following labs and imaging studies  CBC: Recent Labs  Lab 04/19/18 0500 04/20/18 0407 04/21/18 0300 04/22/18 0912 04/23/18 0345  WBC 13.2* 12.4*  10.7* 10.7* 10.5  NEUTROABS 10.5*  --   --   --   --   HGB 8.1* 7.9* 7.7* 8.8* 8.6*  HCT 27.0* 27.3* 25.9* 29.2* 28.6*  MCV 100.7* 100.0 101.2* 98.3 97.9  PLT 71* 75* 74* 87* 95*   Basic Metabolic Panel: Recent Labs  Lab 04/18/18 0851 04/19/18 0500 04/20/18 0407 04/21/18 0300 04/22/18 0540 04/23/18 0345  NA 136 138 141 141 140 139  K 4.0 3.9 3.5 3.8 3.7 3.2*  CL 103 104 104 101 102 98  CO2 26 28 27 31 31 31   GLUCOSE 190* 121* 118* 133* 94 108*  BUN 36* 30* 26* 22 23 25*  CREATININE 0.95 0.78 0.78 0.70 0.67 0.82  CALCIUM 7.9* 7.9* 7.6* 7.8* 7.9* 7.9*  MG 2.4  --   --   --   --   --    GFR: Estimated Creatinine Clearance: 84.9 mL/min (by C-G formula based on SCr of 0.82 mg/dL). Liver Function Tests: No results for input(s): AST, ALT, ALKPHOS, BILITOT, PROT, ALBUMIN in the last 168 hours. No results for input(s): LIPASE, AMYLASE in the last 168 hours. No results for input(s): AMMONIA in the last 168  hours. Coagulation Profile: No results for input(s): INR, PROTIME in the last 168 hours. Cardiac Enzymes: Recent Labs  Lab 04/16/18 2320 04/17/18 0503 04/17/18 1105  TROPONINI 0.05* 0.05* 0.06*   BNP (last 3 results) No results for input(s): PROBNP in the last 8760 hours. HbA1C: No results for input(s): HGBA1C in the last 72 hours. CBG: No results for input(s): GLUCAP in the last 168 hours. Lipid Profile: No results for input(s): CHOL, HDL, LDLCALC, TRIG, CHOLHDL, LDLDIRECT in the last 72 hours. Thyroid Function Tests: No results for input(s): TSH, T4TOTAL, FREET4, T3FREE, THYROIDAB in the last 72 hours. Anemia Panel: No results for input(s): VITAMINB12, FOLATE, FERRITIN, TIBC, IRON, RETICCTPCT in the last 72 hours. Sepsis Labs: No results for input(s): PROCALCITON, LATICACIDVEN in the last 168 hours.  Recent Results (from the past 240 hour(s))  Aerobic/Anaerobic Culture (surgical/deep wound)     Status: None   Collection Time: 04/17/18  7:52 PM  Result Value Ref  Range Status   Specimen Description   Final    SYNOVIAL LEFT HIP Performed at [facility] [address] [place], [place] [address]    Special Requests   Final    NONE Performed at [facility], [place] [address], [place], [place] [address]    Gram Stain   Final    FEW WBC PRESENT, PREDOMINANTLY PMN NO ORGANISMS SEEN    Culture   Final    No growth aerobically or anaerobically. Performed at [facility], [place] [address], [place], [place] [address]    Report Status 04/22/2018 FINAL  Final         Radiology Studies: No results found.      Scheduled Meds: . amiodarone  400 mg Oral BID  . aspirin  81 mg Oral Daily  . Chlorhexidine Gluconate Cloth  6 each Topical Daily  . docusate sodium  100 mg Oral BID  . feeding supplement (PRO-STAT SUGAR FREE 64)  30 mL Oral BID  . ferrous sulfate  325 mg Oral BID WC  . furosemide  40 mg Intravenous BID  . hydrocortisone sodium succinate  50 mg Intravenous Daily  . mouth rinse  15 mL Mouth Rinse BID  . midodrine  10 mg Oral TID WC  . multivitamin with minerals  1 tablet Oral Daily  . potassium chloride  40 mEq Oral BID  . sodium chloride flush  10-40 mL Intracatheter Q12H   Continuous Infusions: . sodium chloride 10 mL/hr at 04/23/18 1300  .  ceFAZolin (ANCEF) IV Stopped (04/23/18 0554)     LOS: 14 days    Time spent: 35 minutes     Elmarie Shiley, MD [facility] Pager [phone]  If 7PM-7AM, please contact night-coverage [email or web address] Password [email or web address] 04/23/2018, 1:45 PM

## 2018-04-23 NOTE — Progress Notes (Signed)
Subjective: 6 Days Post-Op Procedure(s) (LRB): IRRIGATION AND DEBRIDEMENT LEFT HIP (ANTERIOR) (Left) Patient reports pain as mild to left hip.   tolerating PO's. Reports a good night. Denies CP or SOB.  Objective: Vital signs in last 24 hours: Temp:  [97.9 F (36.6 C)-99.1 F (37.3 C)] 97.9 F (36.6 C) (12/22 0345) Pulse Rate:  [66-79] 71 (12/22 0400) Resp:  [20-33] 29 (12/22 0400) BP: (111-140)/(38-63) 111/45 (12/22 0200) SpO2:  [88 %-99 %] 97 % (12/22 0400) Weight:  [122.8 kg] 122.8 kg (12/22 0345)  Intake/Output from previous day: 12/21 0701 - 12/22 0700 In: 1853.1 [P.O.:600; I.V.:754.4; IV Piggyback:498.7] Out: 3250 [Urine:3250] Intake/Output this shift: No intake/output data recorded.  Recent Labs    04/21/18 0300 04/22/18 0912 04/23/18 0345  HGB 7.7* 8.8* 8.6*   Recent Labs    04/22/18 0912 04/23/18 0345  WBC 10.7* 10.5  RBC 2.97* 2.92*  HCT 29.2* 28.6*  PLT 87* 95*   Recent Labs    04/22/18 0540 04/23/18 0345  NA 140 139  K 3.7 3.2*  CL 102 98  CO2 31 31  BUN 23 25*  CREATININE 0.67 0.82  GLUCOSE 94 108*  CALCIUM 7.9* 7.9*   No results for input(s): LABPT, INR in the last 72 hours.  Alert and oriented x3. RRR, Lungs clear, BS x4. Left Calf soft and non tender. L hip dressing C/D/I. No DVT signs. No signs of infection or compartment syndrome. LLE grossly neurovascularly intact.   Anticipated LOS equal to or greater than 2 midnights due to - Age 68 and older with one or more of the following:  - Obesity  - Expected need for hospital services (PT, OT, Nursing) required for safe  discharge  - Anticipated need for postoperative skilled nursing care or inpatient rehab  - Active co-morbidities: None OR   - Unanticipated findings during/Post Surgery: None  - Patient is a high risk of re-admission due to: None   Assessment/Plan: 6 Days Post-Op Procedure(s) (LRB): IRRIGATION AND DEBRIDEMENT LEFT HIP (ANTERIOR) (Left) Advance diet Up with  therapy Discharge to SNF when ready Dressing changed    Shivank Pinedo L 04/23/2018, 8:09 AM

## 2018-04-24 ENCOUNTER — Inpatient Hospital Stay (HOSPITAL_COMMUNITY): Payer: Medicare Other

## 2018-04-24 DIAGNOSIS — R011 Cardiac murmur, unspecified: Secondary | ICD-10-CM

## 2018-04-24 DIAGNOSIS — Z9911 Dependence on respirator [ventilator] status: Secondary | ICD-10-CM

## 2018-04-24 DIAGNOSIS — J9600 Acute respiratory failure, unspecified whether with hypoxia or hypercapnia: Secondary | ICD-10-CM

## 2018-04-24 DIAGNOSIS — I459 Conduction disorder, unspecified: Secondary | ICD-10-CM

## 2018-04-24 DIAGNOSIS — I370 Nonrheumatic pulmonary valve stenosis: Secondary | ICD-10-CM

## 2018-04-24 DIAGNOSIS — I442 Atrioventricular block, complete: Secondary | ICD-10-CM

## 2018-04-24 DIAGNOSIS — R570 Cardiogenic shock: Secondary | ICD-10-CM

## 2018-04-24 DIAGNOSIS — I351 Nonrheumatic aortic (valve) insufficiency: Secondary | ICD-10-CM

## 2018-04-24 DIAGNOSIS — I469 Cardiac arrest, cause unspecified: Secondary | ICD-10-CM

## 2018-04-24 DIAGNOSIS — J9601 Acute respiratory failure with hypoxia: Secondary | ICD-10-CM

## 2018-04-24 DIAGNOSIS — I361 Nonrheumatic tricuspid (valve) insufficiency: Secondary | ICD-10-CM

## 2018-04-24 LAB — PHOSPHORUS: Phosphorus: 4 mg/dL (ref 2.5–4.6)

## 2018-04-24 LAB — GLUCOSE, CAPILLARY
GLUCOSE-CAPILLARY: 102 mg/dL — AB (ref 70–99)
Glucose-Capillary: 283 mg/dL — ABNORMAL HIGH (ref 70–99)
Glucose-Capillary: 93 mg/dL (ref 70–99)
Glucose-Capillary: 110 mg/dL — ABNORMAL HIGH (ref 70–99)
Glucose-Capillary: 92 mg/dL (ref 70–99)

## 2018-04-24 LAB — BLOOD GAS, ARTERIAL
Acid-Base Excess: 13.8 mmol/L — ABNORMAL HIGH (ref 0.0–2.0)
Bicarbonate: 37.4 mmol/L — ABNORMAL HIGH (ref 20.0–28.0)
Drawn by: 295031
O2 Content: 4 L/min
O2 Saturation: 95.4 %
Patient temperature: 98.6
pCO2 arterial: 40.3 mmHg (ref 32.0–48.0)
pH, Arterial: 7.575 — ABNORMAL HIGH (ref 7.350–7.450)
pO2, Arterial: 67.6 mmHg — ABNORMAL LOW (ref 83.0–108.0)

## 2018-04-24 LAB — ECHOCARDIOGRAM COMPLETE
Height: 64 in
Weight: 4246.94 oz

## 2018-04-24 LAB — BASIC METABOLIC PANEL
Anion gap: 11 (ref 5–15)
Anion gap: 8 (ref 5–15)
BUN: 24 mg/dL — ABNORMAL HIGH (ref 8–23)
BUN: 23 mg/dL (ref 8–23)
CALCIUM: 7.9 mg/dL — AB (ref 8.9–10.3)
CO2: 32 mmol/L (ref 22–32)
CO2: 35 mmol/L — ABNORMAL HIGH (ref 22–32)
Calcium: 8.1 mg/dL — ABNORMAL LOW (ref 8.9–10.3)
Chloride: 96 mmol/L — ABNORMAL LOW (ref 98–111)
Chloride: 96 mmol/L — ABNORMAL LOW (ref 98–111)
Creatinine, Ser: 0.82 mg/dL (ref 0.44–1.00)
Creatinine, Ser: 0.86 mg/dL (ref 0.44–1.00)
GFR calc Af Amer: >60 mL/min (ref >60)
GFR calc Af Amer: >60 mL/min (ref >60)
GFR calc non Af Amer: >60 mL/min (ref >60)
GFR calc non Af Amer: >60 mL/min (ref >60)
GLUCOSE: 111 mg/dL — AB (ref 70–99)
Glucose, Bld: 93 mg/dL (ref 70–99)
Potassium: 3.8 mmol/L (ref 3.5–5.1)
Potassium: 3.6 mmol/L (ref 3.5–5.1)
Sodium: 139 mmol/L (ref 135–145)
Sodium: 139 mmol/L (ref 135–145)

## 2018-04-24 LAB — MAGNESIUM: Magnesium: 2 mg/dL (ref 1.7–2.4)

## 2018-04-24 MED ORDER — PRO-STAT SUGAR FREE PO LIQD
30.0000 mL | Freq: Two times a day (BID) | ORAL | Status: DC
  Administered 2018-04-24 – 2018-04-25 (×3): 30 mL
  Filled 2018-04-24 (×3): qty 30

## 2018-04-24 MED ORDER — METOPROLOL TARTRATE 5 MG/5ML IV SOLN: 2.5000 mg | INTRAVENOUS | Status: DC | PRN

## 2018-04-24 MED ORDER — ASPIRIN 81 MG PO CHEW
81.0000 mg | CHEWABLE_TABLET | Freq: Every day | ORAL | Status: DC
  Administered 2018-04-25: 81 mg
  Filled 2018-04-24 (×2): qty 1

## 2018-04-24 MED ORDER — MIDAZOLAM HCL 2 MG/2ML IJ SOLN
INTRAMUSCULAR | Status: AC
  Administered 2018-04-24: 10:00:00
  Filled 2018-04-24: qty 2

## 2018-04-24 MED ORDER — ORAL CARE MOUTH RINSE
15.0000 mL | OROMUCOSAL | Status: DC
  Administered 2018-04-25 – 2018-04-26 (×12): 15 mL via OROMUCOSAL

## 2018-04-24 MED ORDER — FENTANYL CITRATE (PF) 100 MCG/2ML IJ SOLN
INTRAMUSCULAR | Status: AC
  Administered 2018-04-24: 10:00:00
  Filled 2018-04-24: qty 2

## 2018-04-24 MED ORDER — VITAL HIGH PROTEIN PO LIQD
1000.0000 mL | ORAL | Status: DC
  Administered 2018-04-24 – 2018-04-25 (×2): 1000 mL

## 2018-04-24 MED ORDER — PANTOPRAZOLE SODIUM 40 MG IV SOLR
40.0000 mg | Freq: Every day | INTRAVENOUS | Status: DC
  Administered 2018-04-24: 40 mg via INTRAVENOUS
  Filled 2018-04-24: qty 40

## 2018-04-24 MED ORDER — FENTANYL CITRATE (PF) 100 MCG/2ML IJ SOLN
50.0000 ug | INTRAMUSCULAR | Status: DC | PRN
  Administered 2018-04-25: 50 ug via INTRAVENOUS
  Filled 2018-04-24: qty 2

## 2018-04-24 MED ORDER — CHLORHEXIDINE GLUCONATE 0.12% ORAL RINSE (MEDLINE KIT)
15.0000 mL | Freq: Two times a day (BID) | OROMUCOSAL | Status: DC
  Administered 2018-04-24 – 2018-04-25 (×3): 15 mL via OROMUCOSAL

## 2018-04-24 MED ORDER — FENTANYL CITRATE (PF) 100 MCG/2ML IJ SOLN
50.0000 ug | INTRAMUSCULAR | Status: DC | PRN
  Administered 2018-04-24: 50 ug via INTRAVENOUS
  Filled 2018-04-24: qty 2

## 2018-04-24 MED ORDER — HEPARIN SODIUM (PORCINE) 5000 UNIT/ML IJ SOLN
5000.0000 [IU] | Freq: Three times a day (TID) | INTRAMUSCULAR | Status: DC
  Administered 2018-04-24 – 2018-04-28 (×12): 5000 [IU] via SUBCUTANEOUS
  Filled 2018-04-24 (×12): qty 1

## 2018-04-24 MED ORDER — POTASSIUM CHLORIDE CRYS ER 20 MEQ PO TBCR: 40.0000 meq | EXTENDED_RELEASE_TABLET | Freq: Every day | ORAL | Status: DC

## 2018-04-24 MED ORDER — PERFLUTREN LIPID MICROSPHERE
1.0000 mL | INTRAVENOUS | Status: AC | PRN
  Administered 2018-04-24: 3 mL via INTRAVENOUS
  Filled 2018-04-24: qty 10

## 2018-04-24 MED FILL — Medication: Qty: 1 | Status: AC

## 2018-04-24 NOTE — Progress Notes (Signed)
LCSW following for disposition.   Patient currently intubated.   Plan: SNF at dc. Needs bed choice.   LCSW will continue to follow for disposition.   Carolin Coy [place]

## 2018-04-24 NOTE — Progress Notes (Signed)
PROGRESS NOTE    Elizabeth [name]  ZOX:[id number] DOB: 09-20-1950 DOA: 04/09/2018 PCP: Ann Held, DO   Brief Narrative:  67 year old with past medical history significant for osteoarthritis of the left hip who underwent an a steroid injection on the same on 11/11 (per IR) who developed left hip pain on 11/14.  Patient was admitted to [facility] hospital on 12/09 with septic shock, found to have bacteremia with a staph lugdunesis.  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  Cardiology and ID has been helping with patient's care.  Patient also found to have left hip septic arthritis, she underwent irrigation and debridement by Dr. Maureen Ralphs  on 04/18/2018.  Patient also hospital course complicated by SVT, now on amiodarone.  Volume overload, persistent hypotension on midodrine.   Patient develops symptomatic Bradycardia 12-23, CCM came to bedside, Patient received atropine and treatment for presume hyperkalemia. Patient was subsequently intubated.  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 I came to evaluate patient, CCM was at bedside. Patient received atropine and HR increase 70. BP improved to 100 from 70. Patient  Continue to be  tachypnea, complaining of chest pain, unstable. Decision was made to intubate.  I contacted cardiology, PA. They are aware of patient change in condition.  -I saw patient earlier this am, around 7;30 and she was doing well. Denies chest pain or dyspnea at that time.  -Appreciate CCM assistance.  -CCM will take over patient care.   Septic shock,  staph bacteremia, aortic valve endocarditis with severe eccentric aortic insufficiency and perforation as well as moderately elevated mean gradient. -Off IV pressor.  CCM sign off. -On [facility] service since 12/16. -Continue on Midodrine 3 times daily. -TEE ; aortic valve endocarditis with severe eccentric AI and perforation. -Need to finish IV antibiotics before heart cath and CT surgery evaluation. -Patient will need 6 weeks of IV antibiotics until 14-Feb-202019 -White blood cell trending down. -hydrocortisone stopped.      Acute diastolic heart failure exacerbation Patient with pulmonary edema, volume overload. lasix 40 mg IV BID>   Hypokalemia; kcl 40 BID>   Left hip septic arthritis, -Status post left hip irrigation and debridement and drain placement on 12/16.. -Drain removed 12/18. -synovial fluid no growth to date.   Thrombocytopenia; the related to acute infection process. Stable.  Recurrent SVT/atrial tachycardia: She was initially on amiodarone drip.  Transition to oral amiodarone on 12/17 Cardiology following..  Anemia of chronic disease; iron deficiency; Hemoglobin baseline around 9. Started ferrous sulfate.  Transfuse one unit PRBC 12-20.hb up to 8.8 post transfusion.  Hb stable.   Hypertension; Continue to hold Benicar and hydrochlorothiazide Patient currently on a steroid stress dose and midodrine.   H/O lung cancer resected 2010.  Morbid  Obesity.   Stage II sacrum right; pressure injury; present on admission.   RN Pressure Injury Documentation: Pressure Injury 04/09/18 Stage II -  Partial thickness loss of dermis  presenting as a shallow open ulcer with a red, pink wound bed without slough. (Active)  04/09/18 2230  Location: Sacrum  Location Orientation: Right  Staging: Stage II -  Partial thickness loss of dermis presenting as a shallow open ulcer with a red, pink wound bed without slough.  Wound Description (Comments):   Present on Admission: Yes     Pressure Injury 04/09/18 Stage II -  Partial thickness loss of dermis presenting as a shallow open ulcer with a red, pink wound bed without slough. (Active)  04/09/18 2230  Location: Sacrum  Location Orientation: Left  Staging: Stage II -  Partial thickness loss of dermis presenting as a shallow open ulcer with a red, pink wound bed without slough.  Wound Description (Comments):   Present on Admission: Yes    Malnutrition Type:  Nutrition Problem: Increased nutrient needs Etiology: acute illness, wound healing   Malnutrition Characteristics:  Signs/Symptoms: estimated needs   Nutrition Interventions:  Interventions: Prostat, MVI  Estimated body mass index is 45.56 kg/m as calculated from the following:   Height as of this encounter: 5\' 4"  (1.626 m).   Weight as of this encounter: 120.4 kg.   DVT prophylaxis: Eliquis Code Status: Full code Family Communication: Care discussed with patient Disposition Plan: transfer care to CCM  Consultants:   Cardiology  ID  CCM sign off  Orthopedic, Dr Elmyra Ricks   Procedures:   TEE on 12/15  Left hip washout with drain placement on 12/15   Antimicrobials:   Ancef 12-09   Subjective: Earlier today, denies chest pain.  Objective: Vitals:   04/24/18 0940 04/24/18 0945 04/24/18 0950 04/24/18 1036  BP: (!) 74/38 (!) 121/52 (!) 115/55   Pulse: (!) 45 (!) 102 97   Resp: (!) 39 (!) 25 (!) 34   Temp:      TempSrc:      SpO2: 100% 98% 97% 100%  Weight:      Height:    5\' 4"  (1.626 m)    Intake/Output Summary (Last 24 hours) at 04/24/2018 1126 Last data filed at 04/24/2018  1014 Gross per 24 hour  Intake 471.08 ml  Output 3625 ml  Net -3153.92 ml   Filed Weights   04/22/18 0500 04/23/18 0345 04/24/18 0115  Weight: 124.2 kg 122.8 kg 120.4 kg    Examination: earlier today 7-;30  General exam: Alert NAD Respiratory system: Bilateral fine crackles.  Cardiovascular system: S 1, S 2  Gastrointestinal system: BS present, soft, nt Central nervous system: alert.  Extremities: trace edema Skin: left hip clean dressing.   Data Reviewed: I have personally reviewed following labs and imaging studies  CBC: Recent Labs  Lab 04/19/18 0500 04/20/18 0407 04/21/18 0300 04/22/18 0912 04/23/18 0345  WBC 13.2* 12.4* 10.7* 10.7* 10.5  NEUTROABS 10.5*  --   --   --   --   HGB 8.1* 7.9* 7.7* 8.8* 8.6*  HCT 27.0* 27.3* 25.9* 29.2* 28.6*  MCV 100.7* 100.0 101.2* 98.3 97.9  PLT 71* 75* 74* 87* 95*   Basic Metabolic Panel: Recent Labs  Lab 04/18/18 0851  04/20/18 0407 04/21/18 0300 04/22/18 0540 04/23/18 0345 04/24/18 0448  NA 136   < > 141 141 140 139 139  K 4.0   < > 3.5 3.8 3.7 3.2* 3.8  CL 103   < >  104 101 102 98 96*  CO2 26   < > 27 31 31 31  32  GLUCOSE 190*   < > 118* 133* 94 108* 93  BUN 36*   < > 26* 22 23 25* 24*  CREATININE 0.95   < > 0.78 0.70 0.67 0.82 0.82  CALCIUM 7.9*   < > 7.6* 7.8* 7.9* 7.9* 7.9*  MG 2.4  --   --   --   --   --   --    < > = values in this interval not displayed.   GFR: Estimated Creatinine Clearance: 84 mL/min (by C-G formula based on SCr of 0.82 mg/dL). Liver Function Tests: No results for input(s): AST, ALT, ALKPHOS, BILITOT, PROT, ALBUMIN in the last 168 hours. No results for input(s): LIPASE, AMYLASE in the last 168 hours. No results for input(s): AMMONIA in the last 168 hours. Coagulation Profile: No results for input(s): INR, PROTIME in the last 168 hours. Cardiac Enzymes: No results for input(s): CKTOTAL, CKMB, CKMBINDEX, TROPONINI in the last 168 hours. BNP (last 3 results) No results for input(s):  PROBNP in the last 8760 hours. HbA1C: No results for input(s): HGBA1C in the last 72 hours. CBG: Recent Labs  Lab 04/24/18 0946  GLUCAP 283*   Lipid Profile: No results for input(s): CHOL, HDL, LDLCALC, TRIG, CHOLHDL, LDLDIRECT in the last 72 hours. Thyroid Function Tests: No results for input(s): TSH, T4TOTAL, FREET4, T3FREE, THYROIDAB in the last 72 hours. Anemia Panel: No results for input(s): VITAMINB12, FOLATE, FERRITIN, TIBC, IRON, RETICCTPCT in the last 72 hours. Sepsis Labs: No results for input(s): PROCALCITON, LATICACIDVEN in the last 168 hours.  Recent Results (from the past 240 hour(s))  Aerobic/Anaerobic Culture (surgical/deep wound)     Status: None   Collection Time: 04/17/18  7:52 PM  Result Value Ref Range Status   Specimen Description   Final    SYNOVIAL LEFT HIP Performed at [facility] [address] [place], [place] [address]    Special Requests   Final    NONE Performed at [facility], [place] [address], [place], [place] [address]    Gram Stain   Final    FEW WBC PRESENT, PREDOMINANTLY PMN NO ORGANISMS SEEN    Culture   Final    No growth aerobically or anaerobically. Performed at [facility], [place] [address], [place], [place] [address]    Report Status 04/22/2018 FINAL  Final         Radiology Studies: Dg Abd 1 View  Result Date: 04/24/2018 CLINICAL DATA:  Orogastric tube placement. EXAM: ABDOMEN - 1 VIEW COMPARISON:  None. FINDINGS: Orogastric tube enters the stomach, loops in the body and has the tip in the antrum. Mild ileus pattern of the intestine. No sign of obstruction. IMPRESSION: Orogastric tube tip in the antrum of the stomach. Mild ileus pattern. Electronically Signed   By: Nelson Chimes M.D.   On: 04/24/2018 11:06   Dg Chest Port 1 View  Result Date: 04/24/2018 CLINICAL DATA:  ETT placement EXAM: PORTABLE CHEST 1 VIEW COMPARISON:  04/16/2018 FINDINGS: Endotracheal tube with the tip 4  cm above the carina. Nasogastric tube projecting over the stomach. Right-sided PICC line with the tip projecting over the cavoatrial junction. Bilateral interstitial and alveolar airspace opacities with patchy airspace disease in the right upper lobe and right lower lobe. Bilateral small pleural effusions. No pneumothorax. Stable cardiomediastinal silhouette. IMPRESSION: Bilateral interstitial and alveolar airspace opacities which may reflect  pulmonary edema versus multilobar pneumonia. Support lines and tubing in satisfactory position. Electronically Signed   By: Kathreen Devoid   On: 04/24/2018 11:06        Scheduled Meds: . Derrill Memo ON 04/25/2018] aspirin  81 mg Per Tube Daily  . Chlorhexidine Gluconate Cloth  6 each Topical Daily  . feeding supplement (PRO-STAT SUGAR FREE 64)  30 mL Per Tube BID  . feeding supplement (VITAL HIGH PROTEIN)  1,000 mL Per Tube Q24H  . fentaNYL      . furosemide  40 mg Intravenous BID  . heparin injection (subcutaneous)  5,000 Units Subcutaneous Q8H  . mouth rinse  15 mL Mouth Rinse BID  . midazolam      . pantoprazole (PROTONIX) IV  40 mg Intravenous Daily  . sodium chloride flush  10-40 mL Intracatheter Q12H   Continuous Infusions: . sodium chloride Stopped (04/23/18 1444)  .  ceFAZolin (ANCEF) IV Stopped (04/23/18 2219)     LOS: 15 days    Time spent: 35 minutes     Elmarie Shiley, MD [facility] Pager [phone]  If 7PM-7AM, please contact night-coverage [email or web address] Password [facility] 04/24/2018, 11:26 AM

## 2018-04-24 NOTE — Progress Notes (Addendum)
    [place] for Infectious Disease   Reason for visit: Follow up on AV endocarditis  Interval History: events noted and patient now found intubated after asystole.  Daughter at bedside.  No associated rash or diarrhea.     Physical Exam: Constitutional:  Vitals:   04/24/18 1036 04/24/18 1212  BP:    Pulse:    Resp:    Temp:    SpO2: 100% 99%   patient appears in nad, eyes open Eyes: anicteric HENT: + ET in place Respiratory: Normal respiratory effort; CTA B Cardiovascular: RRR with SEM GI: soft, nt, nd MS: no edema  Review of Systems: Unable to be assessed due to patient factors  Lab Results  Component Value Date   WBC 10.5 04/23/2018   HGB 8.6 (L) 04/23/2018   HCT 28.6 (L) 04/23/2018   MCV 97.9 04/23/2018   PLT 95 (L) 04/23/2018    Lab Results  Component Value Date   CREATININE 0.82 04/24/2018   BUN 24 (H) 04/24/2018   NA 139 04/24/2018   K 3.8 04/24/2018   CL 96 (L) 04/24/2018   CO2 32 04/24/2018    Lab Results  Component Value Date   ALT 13 04/11/2018   AST 25 04/11/2018   ALKPHOS 53 04/11/2018     Microbiology: Recent Results (from the past 240 hour(s))  Aerobic/Anaerobic Culture (surgical/deep wound)     Status: None   Collection Time: 04/17/18  7:52 PM  Result Value Ref Range Status   Specimen Description   Final    SYNOVIAL LEFT HIP Performed at [facility], [place] [address], [place], [place] [address]    Special Requests   Final    NONE Performed at [facility], [place] [address], [place], [place] [address]    Gram Stain   Final    FEW WBC PRESENT, PREDOMINANTLY PMN NO ORGANISMS SEEN    Culture   Final    No growth aerobically or anaerobically. Performed at [facility], [place] [address], [place], [place] [address]    Report Status 04/22/2018 FINAL  Final    Impression/Plan:  1. IE - on cefazolin for Staph aortic valve endocarditis.  Continue on cefazolin for 6 weeks through January  26th  2.  Heart block - now intubated after resuscitation.  Current care per CCM.   3.  Septic arthritis - no new growth from I and D.  Continue on cefazolin as above  I will continue to follow intermittently

## 2018-04-24 NOTE — Progress Notes (Signed)
PT Cancellation Note  Patient Details Name: Elizabeth [name] MRN: [id number] DOB: Dec 21, 1950   Cancelled Treatment:    Reason Eval/Treat Not Completed: Medical issues which prohibited therapy, patient intubated.   Claretha Cooper 04/24/2018, 10:31 AM [place] Pager [phone] Office [phone]

## 2018-04-24 NOTE — Progress Notes (Signed)
eLink Physician-Brief Progress Note Patient Name: Elizabeth [name] DOB: 28-Feb-1951 MRN: [id number]   Date of Service  04/24/2018  HPI/Events of Note  Staph bacteremia,Presumed endocarditis, severe AS H/o atrial tach , had AV block/arrest  Now in new onset a fibn - rate controlled on po amio   eICU Interventions  No e/o abscess on echo\ Cards to be informed - defer to them re: need for anticoagulation     Intervention Category Intermediate Interventions: Arrhythmia - evaluation and management  Taralee Marcus V. Lamarr Feenstra 04/24/2018, 10:05 PM

## 2018-04-24 NOTE — Procedures (Signed)
CPR Code Note  Patient became asystolic, code blue called, complete heart block and asystole, treated with epi and atropine, placed on bicarb drip.  Rush Farmer, M.D. [facility] Pulmonary/Critical Care Medicine. Pager: [phone]. After hours pager: [phone].

## 2018-04-24 NOTE — Consult Note (Addendum)
NAME:  Elizabeth [name], MRN:  [id number], DOB:  1951/05/03, LOS: 57 ADMISSION DATE:  04/09/2018, CONSULTATION DATE:  12/23 REFERRING MD:  Tyrell Antonio , CHIEF COMPLAINT:  Symptomatic bradycardia.    Brief History   66 year old white female admitted 12/8 with bacteremia, septic arthritis, and aortic valve endocarditis with severe aortic insufficiency with perforation.  Had been treated with IV antibiotics, I&D of left hip and supportive care.  Critical care consulted on 12/23 emergently with symptomatic bradycardia, chest pain, and shortness of breath.  History of present illness   67 year old female patient with history of osteoarthritis requiring steroid injections to the left hip.  Developed new worsening left hip pain admitted 12/9 w/ new onset septic shock w/ new bacteremia Staph lugdunensis.  She underwent I&D of of left hip by Ortho on 12/17. Further diagnostic evaluation indeed demonstrated aortic valve endocarditis with severe AI and perf as well as marked vegetation.   On 12/23 she developed acute symptomatic bradycardia and near arrest with associated worsening shortness of breath and substernal chest pain critical care was emergently consulted at bedside for ACLS  Past Medical History  Hypertension, hyperlipidemia, lung cancer status post lobectomy, mild aortic stenosis.   Significant Hospital Events   12/8 admitted abx started 12/16 I&D left hip; to triad 12/23 acute resp distress, symptomatic bradycardia; intubated for resp distress and chest pain   Consults:  Cardiology  PCCM   Procedures:  OETT 12/23>>> Right PICC 12/12>>>  Significant Diagnostic Tests:   Micro Data:   Surgical culture 12/16>>no orgs BCX2 12/10: neg Left hip synovial 12/9:  staph Lugdunensis BCX2 12/8: staph Lugdunensis Antimicrobials:  Ancef 12/9>>> vanc 12/8 to 12/9 Flagyl 12/8 to 12/9 2  Interim history/subjective:  New bradycardia w/ chest pain and rapid worsening of SOB  Objective   Blood  pressure (Abnormal) 115/55, pulse 97, temperature 98.4 F (36.9 C), temperature source Oral, resp. rate (Abnormal) 34, height _0  (1.626 m), weight 120.4 kg, SpO2 97 %.        Intake/Output Summary (Last 24 hours) at 04/24/2018 1009 Last data filed at 04/24/2018 8616 Gross per 24 hour  Intake 481.08 ml  Output 2425 ml  Net -1943.92 ml   Filed Weights   04/22/18 0500 04/23/18 0345 04/24/18 0115  Weight: 124.2 kg 122.8 kg 120.4 kg    Examination: General: Obese, lethargic white female, now intubated HENT: Poor dentition mucous membranes moist JVD absent Lungs: Diminished throughout tachypneic with accessory use Cardiovascular: Distant regular rate and rhythm, currently sinus tachycardia, did have bradycardia with third-degree heart block.  Has had intermittent first-degree, then second-degree type I and II since atropine Abdomen: Soft, not tender no organomegaly Extremities: Cool, brisk capillary refill, no edema Neuro: Awake oriented GU: Clear yellow  Resolved Hospital Problem list     Assessment & Plan:   Severe aortic insufficiency with aortic valve perforation in setting of aortic valve endocarditis secondary to septic arthritis. staph Lugdunensis growing in blood and left hip Plan Cont 4 weeks of planned ancef, then cardiac cath and CVTS eval  Acute respiratory distress w/ associated chest pain: ? Acute pulmonary edema -Remains volume overloaded weight down 5 pounds from the 22nd, still +6.6 L Plan Intubate/ventilate VAP bundle PAD protocol: RASS goal -1 Ck CVP Diurese as tolerated  Symptomatic bradycardia. Diastolic HF Had been treated for supraventricular tachycardia with amiodarone.  Treated with atropine x1+ empiric therapy for hyperkalemia Plan Discontinue amiodarone Continue telemetry Dopamine if hypotensive and bradycardia reoccurs Daily asa  Iatrogenic metabolic alk  s/p NaHCO3 Plan Trend chemistry and abg  Anemia of critical Illness w/ FESO4  def Plan Trend cbc  Thrombocytopenia  Plan Trend CBC   Best practice:  Diet: tubefeeds 12/23 Pain/Anxiety/Delirium protocol (if indicated): 12/23 VAP protocol (if indicated): 12/23 DVT prophylaxis: Saxon heparin resumed. Consider adding back IV heparin 12/23 if hgb stable  GI prophylaxis: PPI Glucose control: ssi Mobility: BR Code Status: full code  Family Communication: update  Disposition: Critically ill due to acute onset of dyspnea and symptomatic bradycardia now intubated will continue full ventilator support, antibiotics, and supportive care.  Labs   CBC: Recent Labs  Lab 04/19/18 0500 04/20/18 0407 04/21/18 0300 04/22/18 0912 04/23/18 0345  WBC 13.2* 12.4* 10.7* 10.7* 10.5  NEUTROABS 10.5*  --   --   --   --   HGB 8.1* 7.9* 7.7* 8.8* 8.6*  HCT 27.0* 27.3* 25.9* 29.2* 28.6*  MCV 100.7* 100.0 101.2* 98.3 97.9  PLT 71* 75* 74* 87* 95*    Basic Metabolic Panel: Recent Labs  Lab 04/18/18 0851  04/20/18 0407 04/21/18 0300 04/22/18 0540 04/23/18 0345 04/24/18 0448  NA 136   < > 141 141 140 139 139  K 4.0   < > 3.5 3.8 3.7 3.2* 3.8  CL 103   < > 104 101 102 98 96*  CO2 26   < > _0 32  GLUCOSE 190*   < > 118* 133* 94 108* 93  BUN 36*   < > 26* 22 23 25* 24*  CREATININE 0.95   < > 0.78 0.70 0.67 0.82 0.82  CALCIUM 7.9*   < > 7.6* 7.8* 7.9* 7.9* 7.9*  MG 2.4  --   --   --   --   --   --    < > = values in this interval not displayed.   GFR: Estimated Creatinine Clearance: 84 mL/min (by C-G formula based on SCr of 0.82 mg/dL). Recent Labs  Lab 04/20/18 0407 04/21/18 0300 04/22/18 0912 04/23/18 0345  WBC 12.4* 10.7* 10.7* 10.5    Liver Function Tests: No results for input(s): AST, ALT, ALKPHOS, BILITOT, PROT, ALBUMIN in the last 168 hours. No results for input(s): LIPASE, AMYLASE in the last 168 hours. No results for input(s): AMMONIA in the last 168 hours.  ABG    Component Value Date/Time   PHART 7.575 (H) 04/24/2018 0930   PCO2ART 40.3  04/24/2018 0930   PO2ART 67.6 (L) 04/24/2018 0930   HCO3 37.4 (H) 04/24/2018 0930   TCO2 30 10/19/2008 0354   O2SAT 95.4 04/24/2018 0930     Coagulation Profile: No results for input(s): INR, PROTIME in the last 168 hours.  Cardiac Enzymes: Recent Labs  Lab 04/17/18 1105  TROPONINI 0.06*    HbA1C: Hgb A1c MFr Bld  Date/Time Value Ref Range Status  11/23/2017 11:17 AM 5.5 4.6 - 6.5 % Final    Comment:    Glycemic Control Guidelines for People with Diabetes:Non Diabetic:  <6%Goal of Therapy: <7%Additional Action Suggested:  >8%   01/05/2017 11:31 AM 5.5 4.8 - 5.6 % Final    Comment:             Prediabetes: 5.7 - 6.4          Diabetes: >6.4          Glycemic control for adults with diabetes: <7.0     CBG: Recent Labs  Lab 04/24/18 0946  GLUCAP 283*    Review of Systems:  Unable   Past Medical History  She,  has a past medical history of Aortic stenosis, mild (01/08/2015), Dyspnea, Dysrhythmia, cancer of lung (6/10), Hyperlipidemia, Hypertension, Low back pain, Morbid obesity ([place]) (01/08/2015), Right foot injury, and Vitamin D deficiency.   Surgical History    Past Surgical History:  Procedure Laterality Date  . ANKLE FUSION Left   . CHOLECYSTECTOMY    . INCISION AND DRAINAGE HIP Left 04/17/2018   Procedure: IRRIGATION AND DEBRIDEMENT LEFT HIP (ANTERIOR);  Surgeon: Gaynelle Arabian, MD;  Location: [facility]S;  Service: Orthopedics;  Laterality: Left;  . LUNG REMOVAL, PARTIAL    . TEE WITHOUT CARDIOVERSION     04/17/2018  . TEE WITHOUT CARDIOVERSION N/A 04/17/2018   Procedure: TRANSESOPHAGEAL ECHOCARDIOGRAM (TEE);  Surgeon: Larey Dresser, MD;  Location: [facility] ENDOSCOPY;  Service: Cardiovascular;  Laterality: N/A;  . TOTAL KNEE ARTHROPLASTY Right 12/12/2017   Procedure: RIGHT TOTAL KNEE ARTHROPLASTY;  Surgeon: Gaynelle Arabian, MD;  Location: [facility] ORS;  Service: Orthopedics;  Laterality: Right;  . TUBAL LIGATION       Social History   reports that she quit smoking about  13 years ago. Her smoking use included cigarettes. She has never used smokeless tobacco. She reports that she does not drink alcohol or use drugs.   Family History   Her family history includes Cancer in her brother and mother; Heart disease in her father; Hypertension in her mother; Stroke in her mother.   Allergies Allergies  Allergen Reactions  . Penicillins Other (See Comments)    Fever -when she was 67 yrs old Has patient had a PCN reaction causing immediate rash, facial/tongue/throat swelling, SOB or lightheadedness with hypotension: NO Has patient had a PCN reaction causing severe rash involving mucus membranes or skin necrosis: Unknown Has patient had a PCN reaction that required hospitalization: Unknown Has patient had a PCN reaction occurring within the last 10 years: No Childhood allergy If all of the above answers are "NO", then may proceed with Cephalosporin use.      Home Medications  Prior to Admission medications   Medication Sig Start Date End Date Taking? Authorizing Provider  Cholecalciferol (DIALYVITE VITAMIN D 5000) 125 MCG (5000 UT) capsule Take 5,000 Units by mouth daily.   Yes [provider]  Homeopathic Products (LEG CRAMPS) TABS Take 3 tablets by mouth daily as needed (cramping).   Yes [provider]  HYDROcodone-acetaminophen (NORCO) 5-325 MG tablet Take 1 tablet by mouth every 4 (four) hours as needed (for pain). 04/04/18  Yes Molpus, John, MD  Multiple Vitamins-Minerals (HAIR SKIN AND NAILS FORMULA) TABS Take 2 tablets by mouth daily.   Yes [provider]  olmesartan-hydrochlorothiazide (BENICAR HCT) 20-12.5 MG tablet Take 1 tablet by mouth daily. 01/26/18  Yes Roma Schanz R, DO  oxyCODONE (OXY IR/ROXICODONE) 5 MG immediate release tablet Take 7.5 mg by mouth every 6 (six) hours as needed for moderate pain or severe pain.    Yes [provider]  predniSONE (DELTASONE) 10 MG tablet Take 10 mg by mouth as directed.  Take 6 tablet for 2 Days Take 5 tablet for 2 Days Take 4 tablets for 2 Days Take 3 tablets for 2 Days Take 2 tablets for 2 Days Take 1 tablet for 2 Days   Yes [provider]     Critical care time:  45 min     Erick Colace ACNP-BC [place] Pager # [phone] OR # [phone] if no answer  Attending Note:  67 year old female with native valve endocarditis that developed an asystolic cardiac arrest on 12/23.  Patient was stabilized via epi and atropine but continued to have chest pain.  RR on exam was 44 with crackles.  I reviewed CXR myself, s/p lobectomy and some pulmonary edema noted.  Discussed with PCCM-NP and TRH-MD.  Will intubate and stabilize vitals.  Cardiology recommending treatment with ancef x4 weeks then a cardiac cath prior to open heart surgery.  Will keep in [facility] for abx for now then likely transfer once abx course is complete.  Hope is that vent need will not be continued by then.  PCCM will assume primary service.  The patient is critically ill with multiple organ systems failure and requires high complexity decision making for assessment and support, frequent evaluation and titration of therapies, application of advanced monitoring technologies and extensive interpretation of multiple databases.   Critical Care Time devoted to patient care services described in this note is  34  Minutes. This time reflects time of care of this signee Dr Jennet Maduro. This critical care time does not reflect procedure time, or teaching time or supervisory time of PA/NP/Med student/Med Resident etc but could involve care discussion time.  Rush Farmer, M.D. [facility] Pulmonary/Critical Care Medicine. Pager: [phone]. After hours pager: [phone].

## 2018-04-24 NOTE — Progress Notes (Addendum)
Progress Note  Patient Name: Elizabeth [name] Date of Encounter: 04/24/2018  Primary Cardiologist: Skeet Latch, MD   Subjective   Patient feeling well today.  Denies chest pain or shortness of breath.  Reports not sleeping very well overnight.  Inpatient Medications    Scheduled Meds: . amiodarone  400 mg Oral BID  . aspirin  81 mg Oral Daily  . Chlorhexidine Gluconate Cloth  6 each Topical Daily  . docusate sodium  100 mg Oral BID  . feeding supplement (PRO-STAT SUGAR FREE 64)  30 mL Oral BID  . ferrous sulfate  325 mg Oral BID WC  . furosemide  40 mg Intravenous BID  . mouth rinse  15 mL Mouth Rinse BID  . midodrine  10 mg Oral TID WC  . multivitamin with minerals  1 tablet Oral Daily  . potassium chloride  40 mEq Oral Daily  . sodium chloride flush  10-40 mL Intracatheter Q12H   Continuous Infusions: . sodium chloride Stopped (04/23/18 1444)  .  ceFAZolin (ANCEF) IV Stopped (04/23/18 2219)   PRN Meds: acetaminophen, HYDROcodone-acetaminophen, metoCLOPramide **OR** metoCLOPramide (REGLAN) injection, ondansetron **OR** ondansetron (ZOFRAN) IV, promethazine   Vital Signs    Vitals:   04/24/18 0015 04/24/18 0115 04/24/18 0322 04/24/18 0400  BP: (!) 118/43   (!) 124/44  Pulse: 72   79  Resp: (!) 24   (!) 33  Temp:   98.4 F (36.9 C)   TempSrc:   Oral   SpO2: 93%   (!) 89%  Weight:  120.4 kg    Height:        Intake/Output Summary (Last 24 hours) at 04/24/2018 0757 Last data filed at 04/24/2018 0552 Gross per 24 hour  Intake 631.05 ml  Output 3225 ml  Net -2593.95 ml   Filed Weights   04/22/18 0500 04/23/18 0345 04/24/18 0115  Weight: 124.2 kg 122.8 kg 120.4 kg    Physical Exam   General: Obese, well developed, well nourished, NAD Skin: Warm, dry, intact  Head: Normocephalic, atraumatic, clear, moist mucus membranes. Neck: Negative for carotid bruits. No JVD Lungs:Clear to ausculation bilaterally. No wheezes, rales, or rhonchi. Breathing is  unlabored. Cardiovascular: RRR with S1 S2. + murmurs. No rubs, gallops, or LV heave appreciated. Abdomen: Soft, non-tender, non-distended with normoactive bowel sounds. No obvious abdominal masses. MSK: Strength and tone appear normal for age. 5/5 in all extremities Extremities: No edema. No clubbing or cyanosis. DP/PT pulses 2+ bilaterally Neuro: Alert and oriented. No focal deficits. No facial asymmetry. MAE spontaneously. Psych: Responds to questions appropriately with normal affect.    Labs    Chemistry Recent Labs  Lab 04/22/18 0540 04/23/18 0345 04/24/18 0448  NA 140 139 139  K 3.7 3.2* 3.8  CL 102 98 96*  CO2 31 31 32  GLUCOSE 94 108* 93  BUN 23 25* 24*  CREATININE 0.67 0.82 0.82  CALCIUM 7.9* 7.9* 7.9*  GFRNONAA >60 >60 >60  GFRAA >60 >60 >60  ANIONGAP 7 10 11      Hematology Recent Labs  Lab 04/21/18 0300 04/22/18 0912 04/23/18 0345  WBC 10.7* 10.7* 10.5  RBC 2.56* 2.97* 2.92*  HGB 7.7* 8.8* 8.6*  HCT 25.9* 29.2* 28.6*  MCV 101.2* 98.3 97.9  MCH 30.1 29.6 29.5  MCHC 29.7* 30.1 30.1  RDW 17.7* 17.8* 17.2*  PLT 74* 87* 95*    Cardiac Enzymes Recent Labs  Lab 04/17/18 1105  TROPONINI 0.06*   No results for input(s): TROPIPOC in  the last 168 hours.   BNPNo results for input(s): BNP, PROBNP in the last 168 hours.   DDimer No results for input(s): DDIMER in the last 168 hours.   Radiology    No results found.  Telemetry    04/21/2017 NSR with first-degree AV block- Personally Reviewed  ECG    New tracing of 04/24/2018- Personally Reviewed  Cardiac Studies   Echocardiogram 04/12/18: Study Conclusions  - Left ventricle: The cavity size was normal. Systolic function was normal. The estimated ejection fraction was in the range of 60% to 65%. Wall motion was normal; there were no regional wall motion abnormalities. - Aortic valve: Trileaflet; severely thickened, severely calcified leaflets. 1.5 x 2 cm calcified aortic mass noted  (possible old calcified vegetation). This is new when compared to prior echocardiogram. Valve mobility was restricted. There was mild regurgitation. - Mitral valve: There was moderate regurgitation. - Tricuspid valve: There was moderate regurgitation.  Transesophageal Echocardiogram 04/17/18: Findings: Please see echo section for full report. Normal LV size and wall thickness. EF 55-60% with normal wall motion. Normal right ventricular size and systolic function. Moderate left atrial enlargement, no LA appendage thrombus. Normal right atrium. No PFO or ASD by color doppler. Mild TR, peak RV-RA gradient 45 mmHg. No TV vegetation. There was moderate central mitral regurgitation. ERO by PISA was only 0.14 cm^2 but visually 3+ MR. There did not appear to be a mitral valve vegetation. No systolic flow reversal in the pulmonary vein doppler pattern. The aortic valve was trileaflet with a large vegetation that appeared to involve all leaflets. There did appear to be be perforation. There appeared to be severe, eccentric aortic insufficiency with holodiastolic flow reversal in the ascending thoracic aorta. Mean gradient across the aortic valve was 32 mmHg, suggesting moderate aortic stenosis (elevated gradient was likely due to a combination of high flow from severe AI and leaflet impingement from bulky vegetation.   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 3+ mitral regurgitation without vegetation noted.   Patient Profile     67 y.o. female with past medical history of hypertension, hyperlipidemia, previous lung cancer status post lobectomyandmild aortic stenosis.Patient admitted 04/09/2018 with left hip pain, fever and hypotension. Dx septic arthritis.Cardiology consulted for SVT.Also found to have an aortic mass noted on TTE (possible old calcified vegetation).  Assessment & Plan    1.   Aortic valve endocarditis: -Per echocardiogram 04/12/2017 and TEE, LVEF is 60 to 65% with no wall motion abnormalities however, there is aortic valve endocarditis, severe eccentric area and perforation with severely calcified aortic valve with a 1.5 x 2 cm calcified aortic mass noted when compared to prior echocardiogram with subsequent TEE performed for confirmation -Treatment for septic arthritis with staplugdunenesis (rare) seen in synovial fluid from 12/9, but blood cultures are negative so far. -Once antibiotic treatment is completed and patient is free from infection, plans for cardiac catheterization and CT surgery evaluation given -Plan for antibiotics x4 weeks>>> January appointment with cardiology  2.  Recurrent SVT: -Per telemetry review, SR with no signs of SVT in the past 48 hours -EP, Dr. Lovena Le saw 04/14/2018 in which the patient was on IV amiodarone for both SVT and atrial tachycardia>> transition to PO amiodarone 04/18/2017>> should transition to 400 mg daily on 05/01/2017.  See below -Plan is for amiodarone 400 mg twice daily x2 weeks then 400 mg daily for 2 weeks then 200 mg daily -Per chart review, will  follow after discharge and consider ablation  3.  Septic arthritis: -Per ID/Ortho/primary team -s/p I&D with washout 04/17/2017 -IV antibiotics per above  4.  Volume overload: -Weight, 265lb today, down from 270lb on 04/23/2017 -I&O, net +6.6 L since hospital mission however she is net -2.5 L in the last 24 hours -We will continue IV diuretics -Creatinine,  5.  Acute on chronic anemia: -Hemoglobin/HCT -Started on iron supplementation 04/20/2018 -Per primary team  Signed, Kathyrn Drown NP-C [facility] Pager: [phone] 04/24/2018, 7:57 AM     For questions or updates, please contact   Please consult [email or web address] for contact info under Cardiology/STEMI.  The patient was seen, examined and discussed with Kathyrn Drown, NP  and I agree with the above.   67  y.o. female with past medical history of hypertension, hyperlipidemia, previous lung cancer status post lobectomyandmild aortic stenosis was admitted on 04/09/2018 with left hip pain, fever and hypotension and diagnosed with septic arthritis and aortic valve endocarditis.  Her echocardiogram was performed on 04/12/2018 with finding of aortic valve endocarditis, this was followed by TEE on April 17, 2018 that showed normal LVEF.  There are large vegetations on at least 2 aortic valve leaflets with perforation of the left coronary cusp and severe eccentric aortic regurgitation.  She also has severe mitral regurgitation without any evidence of vegetation on the mitral valve.  The patient has been treated with antibiotics for aortic valve endocarditis and septic arthritis, organism that grew from synovial fluid from the left hip was staplugdunenesis (rare). The plan was that once antibiotic treatment is completed ( 4 weeks) and patient is free from infection, plans for cardiac catheterization and CT surgery.  She was also followed by recurrences VT but no incidence in the last 48-hour after loading with amiodarone.  The patient is also having significant anemia at this secondary to acute endocarditis. The patient will also treated for fluid overload with IV Lasix and diuresed 2.5 L overnight but overall +7 L since the hospital admission.    The patient reported improvement this morning however around 9:00 she developed acute respiratory failure baseline first-degree AV block progressed to 2-1 block and eventually surgically AV block, the patient was intubated and is currently in sinus tachycardia.  On physical exam she appears fluid overloaded, I would recommend to continue IV Lasix. I would recommend to repeat her echocardiogram to evaluate for possible perivalvular abscess that could be causing her AV block.  Patient is also to be evaluated by CT surgeons.   Ena Dawley, MD 04/24/2018

## 2018-04-24 NOTE — Progress Notes (Signed)
Paged Dr. Rondel Baton (on-call Cardiology) to inform that patient had a change in rhythm to A-fib/flutter with a rate of 72, per direction of PCCM [facility] ICU/SD RN4/ Care Coordinator / Rapid Response Nurse Rapid Response Number:  [id number]

## 2018-04-24 NOTE — Progress Notes (Signed)
Informed by central tele that pt was in A.fib/flutter. EKG done showing A.fib. CCM aware. Instructed to inform cardiology.

## 2018-04-24 NOTE — Procedures (Signed)
Intubation Procedure Note Elizabeth [name] [id number] 1950-07-13  Procedure: Intubation Indications: Respiratory insufficiency  Procedure Details Consent: Risks of procedure as well as the alternatives and risks of each were explained to the (patient/caregiver).  Consent for procedure obtained. Time Out: Verified patient identification, verified procedure, site/side was marked, verified correct patient position, special equipment/implants available, medications/allergies/relevent history reviewed, required imaging and test results available.  Performed  Maximum sterile technique was used including gloves, hand hygiene and mask.  MAC    Evaluation Hemodynamic Status: BP stable throughout; O2 sats: stable throughout Patient's Current Condition: stable Complications: No apparent complications Patient did tolerate procedure well. Chest X-ray ordered to verify placement.  CXR: pending.   Jennet Maduro 04/24/2018

## 2018-04-24 NOTE — Progress Notes (Signed)
  Echocardiogram 2D Echocardiogram with definity has been performed.  Darlina Sicilian M 04/24/2018, 3:23 PM

## 2018-04-25 ENCOUNTER — Inpatient Hospital Stay (HOSPITAL_COMMUNITY): Payer: Medicare Other

## 2018-04-25 DIAGNOSIS — R570 Cardiogenic shock: Secondary | ICD-10-CM

## 2018-04-25 DIAGNOSIS — I469 Cardiac arrest, cause unspecified: Secondary | ICD-10-CM

## 2018-04-25 LAB — GLUCOSE, CAPILLARY
Glucose-Capillary: 105 mg/dL — ABNORMAL HIGH (ref 70–99)
Glucose-Capillary: 106 mg/dL — ABNORMAL HIGH (ref 70–99)
Glucose-Capillary: 109 mg/dL — ABNORMAL HIGH (ref 70–99)
Glucose-Capillary: 113 mg/dL — ABNORMAL HIGH (ref 70–99)
Glucose-Capillary: 110 mg/dL — ABNORMAL HIGH (ref 70–99)
Glucose-Capillary: 106 mg/dL — ABNORMAL HIGH (ref 70–99)

## 2018-04-25 LAB — PHOSPHORUS
PHOSPHORUS: 3.5 mg/dL (ref 2.5–4.6)
Phosphorus: 4 mg/dL (ref 2.5–4.6)

## 2018-04-25 LAB — BASIC METABOLIC PANEL
Anion gap: 9 (ref 5–15)
BUN: 27 mg/dL — ABNORMAL HIGH (ref 8–23)
CO2: 36 mmol/L — ABNORMAL HIGH (ref 22–32)
Calcium: 8 mg/dL — ABNORMAL LOW (ref 8.9–10.3)
Chloride: 95 mmol/L — ABNORMAL LOW (ref 98–111)
Creatinine, Ser: 0.8 mg/dL (ref 0.44–1.00)
GFR calc Af Amer: >60 mL/min (ref >60)
GFR calc non Af Amer: >60 mL/min (ref >60)
GLUCOSE: 106 mg/dL — AB (ref 70–99)
Potassium: 3.2 mmol/L — ABNORMAL LOW (ref 3.5–5.1)
Sodium: 140 mmol/L (ref 135–145)

## 2018-04-25 LAB — BLOOD GAS, ARTERIAL
Acid-Base Excess: 14.1 mmol/L — ABNORMAL HIGH (ref 0.0–2.0)
Bicarbonate: 38 mmol/L — ABNORMAL HIGH (ref 20.0–28.0)
Drawn by: 225631
FIO2: 30
MECHVT: 0.44 mL
Mechanical Rate: 10
O2 Saturation: 98.3 %
PEEP: 5 cmH2O
Patient temperature: 101.1
RATE: 16 resp/min
pCO2 arterial: 44.8 mmHg (ref 32.0–48.0)
pH, Arterial: 7.543 — ABNORMAL HIGH (ref 7.350–7.450)
pO2, Arterial: 95.3 mmHg (ref 83.0–108.0)

## 2018-04-25 LAB — CBC
HCT: 26.3 % — ABNORMAL LOW (ref 36.0–46.0)
Hemoglobin: 7.9 g/dL — ABNORMAL LOW (ref 12.0–15.0)
MCH: 29.5 pg (ref 26.0–34.0)
MCHC: 30 g/dL (ref 30.0–36.0)
MCV: 98.1 fL (ref 80.0–100.0)
Platelets: 145 10*3/uL — ABNORMAL LOW (ref 150–400)
RBC: 2.68 MIL/uL — ABNORMAL LOW (ref 3.87–5.11)
RDW: 16.9 % — ABNORMAL HIGH (ref 11.5–15.5)
WBC: 10.5 10*3/uL (ref 4.0–10.5)
nRBC: 0 % (ref 0.0–0.2)

## 2018-04-25 LAB — MAGNESIUM
Magnesium: 1.8 mg/dL (ref 1.7–2.4)
Magnesium: 2.3 mg/dL (ref 1.7–2.4)

## 2018-04-25 MED ORDER — POTASSIUM CHLORIDE 20 MEQ/15ML (10%) PO SOLN
40.0000 meq | Freq: Once | ORAL | Status: AC
  Administered 2018-04-25: 40 meq
  Filled 2018-04-25: qty 30

## 2018-04-25 MED ORDER — VITAL HIGH PROTEIN PO LIQD
1000.0000 mL | ORAL | Status: DC
  Administered 2018-04-25: 1000 mL

## 2018-04-25 MED ORDER — POTASSIUM CHLORIDE 20 MEQ/15ML (10%) PO SOLN
40.0000 meq | ORAL | Status: AC
  Administered 2018-04-25 (×2): 40 meq
  Filled 2018-04-25 (×2): qty 30

## 2018-04-25 MED ORDER — ACETAMINOPHEN 160 MG/5ML PO SOLN
650.0000 mg | Freq: Four times a day (QID) | ORAL | Status: DC | PRN
  Administered 2018-04-25 – 2018-04-26 (×3): 650 mg
  Filled 2018-04-25 (×3): qty 20.3

## 2018-04-25 MED ORDER — PANTOPRAZOLE SODIUM 40 MG PO PACK
40.0000 mg | PACK | Freq: Every day | ORAL | Status: DC
  Administered 2018-04-25: 40 mg
  Filled 2018-04-25: qty 20

## 2018-04-25 MED ORDER — MAGNESIUM SULFATE 2 GM/50ML IV SOLN
2.0000 g | Freq: Once | INTRAVENOUS | Status: AC
  Administered 2018-04-25: 2 g via INTRAVENOUS
  Filled 2018-04-25: qty 50

## 2018-04-25 NOTE — Progress Notes (Signed)
Subjective: Events of yesterday noted. Intubated; awake and responsive this AM   Objective: Vital signs in last 24 hours: Temp:  [99.2 F (37.3 C)-101.3 F (38.5 C)] 101.1 F (38.4 C) (12/24 0600) Pulse Rate:  [45-128] 76 (12/24 0600) Resp:  [16-39] 18 (12/24 0600) BP: (74-155)/(26-67) 102/37 (12/24 0600) SpO2:  [89 %-100 %] 98 % (12/24 0600) FiO2 (%):  [30 %-100 %] 30 % (12/24 0312) Weight:  [114.3 kg] 114.3 kg (12/24 0410)  Intake/Output from previous day: 12/23 0701 - 12/24 0700 In: 227.7 [IV Piggyback:227.7] Out: 5400 [Urine:5400] Intake/Output this shift: No intake/output data recorded.  Recent Labs    04/22/18 0912 04/23/18 0345 04/25/18 0411  HGB 8.8* 8.6* 7.9*   Recent Labs    04/23/18 0345 04/25/18 0411  WBC 10.5 10.5  RBC 2.92* 2.68*  HCT 28.6* 26.3*  PLT 95* 145*   Recent Labs    04/24/18 1439 04/25/18 0411  NA 139 140  K 3.6 3.2*  CL 96* 95*  CO2 35* 36*  BUN 23 27*  CREATININE 0.86 0.80  GLUCOSE 111* 106*  CALCIUM 8.1* 8.0*   No results for input(s): LABPT, INR in the last 72 hours.  Incision: no drainage No cellulitis present Compartment soft   Assessment/Plan: Left hip irrigation and debridement- The hip looks fine but overall condition deteriorated yesterday. She is stable from an orthopaedic standpoint. The incision is well healed. WEe will sign off at this time. Please call with any questions/concerns   Gaynelle Arabian 04/25/2018, 7:38 AM

## 2018-04-25 NOTE — Progress Notes (Signed)
OT Cancellation Note and Discharge  Patient Details Name: [name] MRN: [id number] DOB: Aug 09, 1950   Cancelled Treatment:    Reason Eval/Treat Not Completed: Patient not medically ready. on ventilator as of yesterday due to asystole. Will need to re-order OT to resume therapy services once extubated.   [place], OTR/L Acute Rehab Services Pager [phone] Office [phone]     Almon Register 04/25/2018, 7:29 AM

## 2018-04-25 NOTE — Progress Notes (Addendum)
Progress Note  Patient Name: Elizabeth [name] Date of Encounter: 04/25/2018  Primary Cardiologist: Skeet Latch, MD  Subjective   Pt intubated but communicating. Denies chest pain. Feeling better today. Plan per PCCM is to continue with vent today and extubated tomorrow then move to [facility] to begin surgical workup with cath and TCTS consultation  Inpatient Medications    Scheduled Meds: . aspirin  81 mg Per Tube Daily  . chlorhexidine gluconate (MEDLINE KIT)  15 mL Mouth Rinse BID  . Chlorhexidine Gluconate Cloth  6 each Topical Daily  . feeding supplement (PRO-STAT SUGAR FREE 64)  30 mL Per Tube BID  . feeding supplement (VITAL HIGH PROTEIN)  1,000 mL Per Tube Q24H  . furosemide  40 mg Intravenous BID  . heparin injection (subcutaneous)  5,000 Units Subcutaneous Q8H  . mouth rinse  15 mL Mouth Rinse BID  . mouth rinse  15 mL Mouth Rinse 10 times per day  . pantoprazole (PROTONIX) IV  40 mg Intravenous Daily  . sodium chloride flush  10-40 mL Intracatheter Q12H   Continuous Infusions: . sodium chloride Stopped (04/23/18 1444)  .  ceFAZolin (ANCEF) IV Stopped (04/25/18 1610)   PRN Meds: acetaminophen (TYLENOL) oral liquid 160 mg/5 mL, fentaNYL (SUBLIMAZE) injection, fentaNYL (SUBLIMAZE) injection   Vital Signs    Vitals:   04/25/18 0600 04/25/18 0700 04/25/18 0800 04/25/18 0804  BP: (!) 102/37 (!) 105/41 (!) 101/33   Pulse: 76 75 73   Resp: _0 Temp: (!) 101.1 F (38.4 C) (!) 100.4 F (38 C) 100 F (37.8 C)   TempSrc:   Core   SpO2: 98% 98% 98% 98%  Weight:      Height:        Intake/Output Summary (Last 24 hours) at 04/25/2018 0829 Last data filed at 04/25/2018 0800 Gross per 24 hour  Intake 1074.53 ml  Output 5400 ml  Net -4325.47 ml   Filed Weights   04/23/18 0345 04/24/18 0115 04/25/18 0410  Weight: 122.8 kg 120.4 kg 114.3 kg    Physical Exam   General: Obese, well developed, well nourished, intubated sedated Skin: Warm, dry, intact    Head: Normocephalic, atraumatic, clear, moist mucus membranes. Neck: Negative for carotid bruits. No JVD Lungs:Clear to ausculation bilaterally. No wheezes, rales, or rhonchi. Breathing is unlabored. Cardiovascular: RRR with S1 S2. + murmur. No rubs, gallops, or LV heave appreciated. Abdomen: Soft, non-tender, non-distended with normoactive bowel sounds. No obvious abdominal masses. MSK: Strength and tone appear normal for age. 5/5 in all extremities Extremities: 1+ BLE edema. No clubbing or cyanosis. DP/PT pulses 1+ bilaterally Neuro: Alert and oriented. No focal deficits. No facial asymmetry. MAE spontaneously. Psych: Responds to questions appropriately with normal affect/writing   Labs    Chemistry Recent Labs  Lab 04/24/18 0448 04/24/18 1439 04/25/18 0411  NA 139 139 140  K 3.8 3.6 3.2*  CL 96* 96* 95*  CO2 32 35* 36*  GLUCOSE 93 111* 106*  BUN 24* 23 27*  CREATININE 0.82 0.86 0.80  CALCIUM 7.9* 8.1* 8.0*  GFRNONAA >60 >60 >60  GFRAA >60 >60 >60  ANIONGAP _1 Hematology Recent Labs  Lab 04/22/18 0912 04/23/18 0345 04/25/18 0411  WBC 10.7* 10.5 10.5  RBC 2.97* 2.92* 2.68*  HGB 8.8* 8.6* 7.9*  HCT 29.2* 28.6* 26.3*  MCV 98.3 97.9 98.1  MCH 29.6 29.5 29.5  MCHC 30.1 30.1 30.0  RDW 17.8* 17.2* 16.9*  PLT 87* 95* 145*    Cardiac EnzymesNo results for input(s): TROPONINI in the last 168 hours. No results for input(s): TROPIPOC in the last 168 hours.   BNPNo results for input(s): BNP, PROBNP in the last 168 hours.   DDimer No results for input(s): DDIMER in the last 168 hours.   Radiology    Dg Abd 1 View  Result Date: 04/24/2018 CLINICAL DATA:  Orogastric tube placement. EXAM: ABDOMEN - 1 VIEW COMPARISON:  None. FINDINGS: Orogastric tube enters the stomach, loops in the body and has the tip in the antrum. Mild ileus pattern of the intestine. No sign of obstruction. IMPRESSION: Orogastric tube tip in the antrum of the stomach. Mild ileus pattern.  Electronically Signed   By: Rudi Knippenberg Chimes M.D.   On: 04/24/2018 11:06   Portable Chest Xray  Result Date: 04/25/2018 CLINICAL DATA:  Respiratory distress. EXAM: PORTABLE CHEST 1 VIEW COMPARISON:  04/24/2018. FINDINGS: Unchanged cardiomegaly. Support tubes and lines are stable. BILATERAL pulmonary opacities are redemonstrated, and may be minimally improved, when technique differences are considered. Slight decreased LEFT effusion. RIGHT effusion also appears smaller. IMPRESSION: Slight improvement aeration. Electronically Signed   By: Staci Righter M.D.   On: 04/25/2018 07:07   Dg Chest Port 1 View  Result Date: 04/24/2018 CLINICAL DATA:  ETT placement EXAM: PORTABLE CHEST 1 VIEW COMPARISON:  04/16/2018 FINDINGS: Endotracheal tube with the tip 4 cm above the carina. Nasogastric tube projecting over the stomach. Right-sided PICC line with the tip projecting over the cavoatrial junction. Bilateral interstitial and alveolar airspace opacities with patchy airspace disease in the right upper lobe and right lower lobe. Bilateral small pleural effusions. No pneumothorax. Stable cardiomediastinal silhouette. IMPRESSION: Bilateral interstitial and alveolar airspace opacities which may reflect pulmonary edema versus multilobar pneumonia. Support lines and tubing in satisfactory position. Electronically Signed   By: Kathreen Devoid   On: 04/24/2018 11:06    Telemetry    04/25/18 NSR with  - Personally Reviewed  ECG    No new tracing as of 04/25/18- Personally Reviewed  Cardiac Studies   Echocardiogram 04/12/18: Study Conclusions  - Left ventricle: The cavity size was normal. Systolic function was normal. The estimated ejection fraction was in the range of 60% to 65%. Wall motion was normal; there were no regional wall motion abnormalities. - Aortic valve: Trileaflet; severely thickened, severely calcified leaflets. 1.5 x 2 cm calcified aortic mass noted (possible old calcified vegetation).  This is new when compared to prior echocardiogram. Valve mobility was restricted. There was mild regurgitation. - Mitral valve: There was moderate regurgitation. - Tricuspid valve: There was moderate regurgitation.  Transesophageal Echocardiogram 04/17/18: Findings: Please see echo section for full report. Normal LV size and wall thickness. EF 55-60% with normal wall motion. Normal right ventricular size and systolic function. Moderate left atrial enlargement, no LA appendage thrombus. Normal right atrium. No PFO or ASD by color doppler. Mild TR, peak RV-RA gradient 45 mmHg. No TV vegetation. There was moderate central mitral regurgitation. ERO by PISA was only 0.14 cm^2 but visually 3+ MR. There did not appear to be a mitral valve vegetation. No systolic flow reversal in the pulmonary vein doppler pattern. The aortic valve was trileaflet with a large vegetation that appeared to involve all leaflets. There did appear to be be perforation. There appeared to be severe, eccentric aortic insufficiency with holodiastolic flow reversal in the ascending thoracic aorta. Mean gradient across the aortic valve was 32 mmHg, suggesting moderate aortic stenosis (elevated gradient was  likely due to a combination of high flow from severe AI and leaflet impingement from bulky vegetation.   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 3+ mitral regurgitation without vegetation noted.   Patient Profile     67 y.o. female with past medical history of hypertension, hyperlipidemia, previous lung cancer status post lobectomyandmild aortic stenosis.Patient admitted 04/09/2018 with left hip pain, fever and hypotension. Dx septic arthritis.Cardiology consulted for SVT.Also found to have an aortic mass noted on TTE (possible old calcified vegetation).  Assessment & Plan    1.  Aortic valve endocarditis: -Per  echocardiogram 04/12/2017 and TEE, LVEF is 60 to 65% with no wall motion abnormalities however, there is aortic valve endocarditis, severe eccentric area and perforation with severely calcified aortic valve with a 1.5 x 2 cm calcified aortic mass noted when compared to prior echocardiogram with subsequent TEE performed for confirmation -Treatment for septic arthritis with staplugdunenesis (rare) seen in synovial fluid from 12/9, but blood cultures are negative so far -Tentative plan for antibiotics x4 weeks -04/24/18 pt decompensated secondary to first-degree AV block which progressed to 2-1 block>>PCCM consult and pt was intubated for respiratory safety>>follow up tele review showed ST -Repeat echocardiogram ordered 04/24/18 to assess for possible perivalvular abscess that could be causing AV block which showed nodular calcification of the leaflet tips with severe AS -Plan is to leave intubated on additional day, extubate tomorrow and transport to [facility] for surgical workup with cardiac catheterization and TCTS consultation for anticipated AS treatment   2.  Recurrent SVT>>>now with episodes of symptomatic AV block: -Per telemetry review, SR with no signs of SVT -EP, Dr. Lovena Le saw 04/14/2018 in which the patient was on IV amiodarone for both SVT and atrial tachycardia>> transitioned to PO amiodarone 04/18/2017>> should transition to 400 mg daily on 05/01/2017.  See below -Plan was for amiodarone 400 mg twice daily x2 weeks then 400 mg daily for 2 weeks then 200 mg daily -Amiodarone discontinued in the setting of severe bradycardia and 1st degree AV block which progressed to 2-1 AV block  -Currently with 1st degree block with adequate rate   3.  Septic arthritis: -Per ID/Ortho/primary team -s/p I&D with washout 04/17/2017 -IV antibiotics per above  4.  Volume overload: -Weight, 251lb today, down from 265lb yesterday -I&O, net positive 846L since admission>>>total 24H output of 5.1L  -Continue  IV Lasix 42m twice daily  -Creatinine, 0.86 today    Signed, JKathyrn DrownNP-C [facility] Pager: [phone]/24/2019, 8:29 AM    For questions or updates, please contact   Please consult [email or web address] for contact info under Cardiology/STEMI.  The patient was seen, examined and discussed with JKathyrn Drown NP  and I agree with the above.   67y.o. female with past medical history of hypertension, hyperlipidemia, previous lung cancer status post lobectomyandmild aortic stenosis was admitted on 04/09/2018 with left hip pain, fever and hypotension and diagnosed with septic arthritis and aortic valve endocarditis.  Her echocardiogram was performed on 04/12/2018 with finding of aortic valve endocarditis, this was followed by TEE on April 17, 2018 that showed normal LVEF.  There are large vegetations on at least 2 aortic valve leaflets with perforation of the left coronary cusp and severe eccentric aortic regurgitation.  She also has severe mitral regurgitation without any evidence of vegetation on the mitral valve.  The patient has been treated with antibiotics for aortic valve endocarditis and septic arthritis, organism that grew from  synovial fluid from the left hip was staplugdunenesis (rare). The patient is also having significant anemia at this secondary to acute endocarditis.  The patient developed acute respiratory failure yesterday with 2-1 block and eventually complete AV block, the patient was intubated, diuresed overnight 5 L. Overnight 3 hour episode of a-fib rate controlled, now in SR.   Plan: - continue diuresis with iv lasix, replace KCl - per CCM extubation tomorrow - transfer to [facility] for a cardiac cath and CT surgery of aortic valve endocarditis - continue iv ATB - during this hospitalization - nsVT, PAF (3 hours total) and 2:1 AVB, we will continue to follow  Ena Dawley, MD 04/25/2018

## 2018-04-25 NOTE — Progress Notes (Signed)
Physical Therapy Discharge Patient Details Name: Elizabeth [name] MRN: [id number] DOB: 26-Dec-1950 Today's Date: 04/25/2018 Time:  -     Patient discharged from PT services secondary to medical decline - on ventilator. Will need to re-order PT to resume therapy services once extubated.  Please see latest therapy progress note for current level of functioning and progress toward goals.     Claretha Cooper 04/25/2018, 6:55 AM [place] Pager [phone] Office [phone]

## 2018-04-25 NOTE — Progress Notes (Signed)
NAME:  Elizabeth [name], MRN:  [id number], DOB:  [birth year], LOS: 75 ADMISSION DATE:  04/09/2018, CONSULTATION DATE:  12/23 REFERRING MD:  Tyrell Antonio , CHIEF COMPLAINT:  Symptomatic bradycardia.    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  Had been treated with IV antibiotics, I&D of left hip and supportive care.  Critical care consulted on 12/23 emergently with symptomatic bradycardia, chest pain, and shortness of breath.  Past Medical History  Hypertension, hyperlipidemia, lung cancer status post lobectomy, mild aortic stenosis.   Significant Hospital Events   12/8 admitted abx started 12/16 I&D left hip; to triad 12/23 acute resp distress, symptomatic bradycardia; intubated for resp distress and chest pain  12/24 looks better. Working on weaning  12/25 weaning very well, will consider extubation today Consults:  Cardiology  PCCM   Procedures:  OETT 12/23>>>12/25 Right PICC 12/12>>>  Significant Diagnostic Tests:   Micro Data:  Surgical culture 12/16>>no orgs BCX2 12/10: neg Left hip synovial 12/9:  staph Lugdunensis BCX2 12/8: staph Lugdunensis  Antimicrobials:  Ancef 12/9>>> vanc 12/8 to 12/9 Flagyl 12/8 to 12/9  Interim history/subjective:  No events overnight, no new complaints Weaning well this AM  Objective   Blood pressure (!) 112/41, pulse 84, temperature (!) 100.9 F (38.3 C), resp. rate 17, height '5\' 4"'  (1.626 m), weight 114.8 kg, SpO2 97 %.    Vent Mode: PRVC FiO2 (%):  [30 %] 30 % Set Rate:  [10 bmp] 10 bmp Vt Set:  [440 mL] 440 mL PEEP:  [5 cmH20] 5 cmH20 Pressure Support:  [8 cmH20] 8 cmH20 Plateau Pressure:  [17 cmH20-20 cmH20] 20 cmH20   Intake/Output Summary (Last 24 hours) at 04/26/2018 0741 Last data filed at 04/26/2018 0526 Gross per 24 hour  Intake 1683.99 ml  Output 2865 ml  Net -1181.01 ml   Filed Weights   04/24/18 0115 04/25/18 0410 04/26/18 0428  Weight: 120.4 kg 114.3 kg 114.8 kg   Examination: General: Awake and interactive, moving all ext to command HEENT: [place]/AT, PERRL, EOM-I and MMM, ETT in place Pulmonary: Coarse BS diffusely Cardiac: RRR, 2/4 SEM at LUSB Abdomen: Soft, NT, ND and +BS Extremities: 1+ edema and -tenderness Neuro: Awake and interactive, moving all ext to command  Resolved Hospital Problem list     Assessment & Plan:   Severe aortic insufficiency with aortic valve perforation in setting of aortic valve endocarditis secondary to septic arthritis. staph Lugdunensis growing in blood and left hip Plan Continue 4 wks of ancef Will need valve replacement Extubate today Will move to [facility] for cardiology and CVTS to evaluate  Acute respiratory distress w/ associated chest pain 2/2 Acute pulmonary edema -excellent response to positive pressure ventilation and diuresis  Was - 5 liters yesterday but still net + 5 liters.  -pcxr personally reviewed aeration has improved some lines and tubes are in satisfactory position  Plan Extubate today Titrate O2 for sat of 88-92% Continue diureses, lasix 40 mg IV q6 x3 doses D/C sedation IS Ambulate Flutter valve Monitor for airway protection  Symptomatic bradycardia. Acute Diastolic HF Had been treated for supraventricular tachycardia with amiodarone. Now off rate control meds Plan Continue tele Lasix as above ASA as ordered  Iatrogenic metabolic alk s/p NaHCO3 Plan BMET in AM Replace electrolytes as indicated  Fluid and electrolyte imbalance: hypokalemia  Plan BMET in AM Replace electrolytes as indicated  Anemia of critical Illness w/ FESO4 def  Drifted down from 8/6 to 7.9 no clear source of bleeding Plan CBC in AM  Thrombocytopenia  -> improved Plan Trend CBC  Hypokalemia: due to lasix Plan: BMET in AM KCl 40 meq IV through PICC  Best practice:  Diet: tubefeeds 12/23 Pain/Anxiety/Delirium protocol  (if indicated): 12/23 VAP protocol (if indicated): 12/23 DVT prophylaxis:  heparin resumed. Consider adding back IV heparin 12/23 if hgb stable  GI prophylaxis: PPI Glucose control: ssi Mobility: BR Code Status: full code  Family Communication: update  Disposition remains critically ill d/t need for on-going titration of vent support, weaning and titration of medications for cardiac output optimization.  Looking better.   The patient is critically ill with multiple organ systems failure and requires high complexity decision making for assessment and support, frequent evaluation and titration of therapies, application of advanced monitoring technologies and extensive interpretation of multiple databases.   Critical Care Time devoted to patient care services described in this note is  33  Minutes. This time reflects time of care of this signee Dr Jennet Maduro. This critical care time does not reflect procedure time, or teaching time or supervisory time of PA/NP/Med student/Med Resident etc but could involve care discussion time.  Rush Farmer, M.D. [facility] Pulmonary/Critical Care Medicine. Pager: [phone]. After hours pager: [phone].

## 2018-04-25 NOTE — Progress Notes (Signed)
eLink Physician-Brief Progress Note Patient Name: Elizabeth [name] DOB: 05-Feb-1951 MRN: [id number]   Date of Service  04/25/2018  HPI/Events of Note    eICU Interventions  Hypokalemia -repleted      Intervention Category Minor Interventions: Electrolytes abnormality - evaluation and management  Rakesh V. Alva 04/25/2018, 5:13 AM

## 2018-04-25 NOTE — Progress Notes (Signed)
Nutrition Follow-up  DOCUMENTATION CODES:   Morbid obesity  INTERVENTION:   Vital High Protein @ 60 ml/hr (1440 ml) via OGT  Provides: 1440 kcals, 126 grams protein, 1204 ml free water. Meets 100% of needs.   NUTRITION DIAGNOSIS:   Increased nutrient needs related to acute illness, wound healing as evidenced by estimated needs.  Ongoing   GOAL:   Patient will meet greater than or equal to 90% of their needs  Not met- to adjust TF  MONITOR:   PO intake, Supplement acceptance, Weight trends, Labs, Skin  REASON FOR ASSESSMENT:   Other (Comment)(Pressure Injury report)    ASSESSMENT:   67 year old female with history of HTN and osteoarthritis. She received a steroid injection into L hip in mid-November and shortly after she started having severe L hip pain. She developed fever 1 week ago which resolved and recently returned. She presented to the ED with L hip pain and urinary frequency. She was found to be hypotensive and highly febrile with temperature of 105 degrees F.   12/23- chest pain, shortness of breath- intubated   Pt alert and oriented on vent. Per CCM plan to extubated in the next 24-48 hrs. Pt tolerating Vital High Protein @ 40 ml/hr without complication. RD to adjust TF to better meet needs utilizing EDW of 111.5 kg. Weight trending down from 125.4 kg on 12/20 to 114.3 kg today. Will continue to monitor trends.   Once pt is extubated, plan to [facility]/valve replacement.   Patient is currently intubated on ventilator support MV: 7.5 L/min Temp (24hrs), Avg:100.5 F (38.1 C), Min:99.2 F (37.3 C), Max:101.3 F (38.5 C)  I/O: net +5 L  UOP: 5.4 L x 24 hrs- continues to diurese    Medications reviewed and include: 40 mg ;lasix BID, Mg sulfate  Labs reviewed: K 3.2 (L) Phos & Mg wdl   Diet Order:   Diet Order            Diet NPO time specified  Diet effective now              EDUCATION NEEDS:   Education needs have been  addressed  Skin:  Skin Assessment: Skin Integrity Issues: Skin Integrity Issues:: Incisions Stage II: both sides of sacrum Incisions: L hip (12/16)  Last BM:  04/23/18  Height:   Ht Readings from Last 1 Encounters:  04/24/18 '5\' 4"'  (1.626 m)    Weight:   Wt Readings from Last 1 Encounters:  04/25/18 114.3 kg    Ideal Body Weight:  54.54 kg  BMI:  Body mass index is 43.25 kg/m.  Estimated Nutritional Needs:   Kcal:  1227-1561 kcal  Protein:  110-125 grams  Fluid:  >/= 1.5 L/day   Mariana Single RD, LDN Clinical Nutrition Pager # - [phone]

## 2018-04-25 NOTE — Progress Notes (Addendum)
NAME:  Elizabeth [name], MRN:  [id number], DOB:  1950/09/17, LOS: 63 ADMISSION DATE:  04/09/2018, CONSULTATION DATE:  12/23 REFERRING MD:  Tyrell Antonio , CHIEF COMPLAINT:  Symptomatic bradycardia.    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  Had been treated with IV antibiotics, I&D of left hip and supportive care.  Critical care consulted on 12/23 emergently with symptomatic bradycardia, chest pain, and shortness of breath.  Past Medical History  Hypertension, hyperlipidemia, lung cancer status post lobectomy, mild aortic stenosis.   Significant Hospital Events   12/8 admitted abx started 12/16 I&D left hip; to [facility] 12/23 acute resp distress, symptomatic bradycardia; intubated for resp distress and chest pain  12/24 looks better. Working on Solicitor:  Cardiology  PCCM   Procedures:  OETT 12/23>>> Right PICC 12/12>>>  Significant Diagnostic Tests:   Micro Data:   Surgical culture 12/16>>no orgs BCX2 12/10: neg Left hip synovial 12/9:  staph Lugdunensis BCX2 12/8: staph Lugdunensis Antimicrobials:  Ancef 12/9>>> vanc 12/8 to 12/9 Flagyl 12/8 to 12/9 2  Interim history/subjective:  New bradycardia w/ chest pain and rapid worsening of SOB  Objective   Blood pressure (Abnormal) 108/29, pulse 75, temperature 99.9 F (37.7 C), resp. rate 19, height _0  (1.626 m), weight 114.3 kg, SpO2 99 %.    Vent Mode: PSV;CPAP FiO2 (%):  [30 %-100 %] 30 % Set Rate:  [10 bmp-14 bmp] 10 bmp Vt Set:  [440 mL] 440 mL PEEP:  [5 cmH20] 5 cmH20 Pressure Support:  [8 cmH20] 8 cmH20 Plateau Pressure:  [17 KKX38-18 cmH20] 17 cmH20   Intake/Output Summary (Last 24 hours) at 04/25/2018 0928 Last data filed at 04/25/2018 0800 Gross per 24 hour  Intake 1074.53 ml  Output 5400 ml  Net -4325.47 ml   Filed Weights   04/23/18 0345 04/24/18 0115 04/25/18 0410    Weight: 122.8 kg 120.4 kg 114.3 kg    Examination: General: Awake, no distress on pressure support ventilation HEENT normocephalic atraumatic no jugular venous distention orally intubated Pulmonary: Diminished bases no accessory use of basis ports Cardiac: Murmur rub Abdomen: Soft nontender no organomegaly Extremities: Warm dry brisk cap refill, dependent edema present is improved. Neuro: Awake, follows commands, no focal deficits.  Resolved Hospital Problem list     Assessment & Plan:   Severe aortic insufficiency with aortic valve perforation in setting of aortic valve endocarditis secondary to septic arthritis. staph Lugdunensis growing in blood and left hip Plan Cont 4 weeks abx w/ ancef Will need valve replacement  Hope to extubate in 24 hours then can move to [facility] where she can be monitored more easily by cardiology and cardio-thoracic surgery Next step will be cardiac cath and then valve replacement   Acute respiratory distress w/ associated chest pain 2/2 Acute pulmonary edema -excellent response to positive pressure ventilation and diuresis  Was - 5 liters yesterday but still net + 5 liters.  -pcxr personally reviewed aeration has improved some lines and tubes are in satisfactory position  Plan Cont full vent support another 24 hrs Cont VAP bundle Lasix PAD protocol RASS goal 0 to -1 Hope to extubate in am 12/25  Symptomatic bradycardia. Acute Diastolic HF Had been treated for supraventricular tachycardia with amiodarone. Now off rate control meds Plan Cont tele  Cont lasix Cont asa   Iatrogenic metabolic alk s/p NaHCO3 Plan Trending Chem and abg  Fluid and electrolyte  imbalance: hypokalemia  Plan Replace and recheck  Anemia of critical Illness w/ FESO4 def Drifted down from 8/6 to 7.9 no clear source of bleeding Plan Trend cbc  Thrombocytopenia  -> improved Plan Trend CBC   Best practice:  Diet: tubefeeds 12/23 Pain/Anxiety/Delirium protocol  (if indicated): 12/23 VAP protocol (if indicated): 12/23 DVT prophylaxis: [place] heparin resumed. Consider adding back IV heparin 12/23 if hgb stable  GI prophylaxis: PPI Glucose control: ssi Mobility: BR Code Status: full code  Family Communication: update  Disposition remains critically ill d/t need for on-going titration of vent support, weaning and titration of medications for cardiac output optimization.  Looking better.   Erick Colace ACNP-BC [place] Pager # [phone] OR # [phone] if no answer  Attending Note:  67 year old female with infective endocarditis that suffered a bradycardic cardiac arrest on 12/23 and was intubated.  Patient was intubated, placed on dopamine and diuresed overnight.  Appears much better this AM on exam with coarse BS diffusely.  I reviewed CXR myself, ETT is in a good position and pulmonary edema noted.  Discussed with PCCM-NP.  Will continue diureses.  Start weaning.  Anticipate extubation in AM.  Expect to extubate in AM and likely transfer to [facility] for observation and to be seen by cards and CVTS.  PCCM will continue to follow.  The patient is critically ill with multiple organ systems failure and requires high complexity decision making for assessment and support, frequent evaluation and titration of therapies, application of advanced monitoring technologies and extensive interpretation of multiple databases.   Critical Care Time devoted to patient care services described in this note is  32  Minutes. This time reflects time of care of this signee Dr Jennet Maduro. This critical care time does not reflect procedure time, or teaching time or supervisory time of PA/NP/Med student/Med Resident etc but could involve care discussion time.  Rush Farmer, M.D. [facility] Pulmonary/Critical Care Medicine. Pager: [phone]. After hours pager: [phone].

## 2018-04-26 ENCOUNTER — Inpatient Hospital Stay (HOSPITAL_COMMUNITY): Payer: Medicare Other

## 2018-04-26 LAB — CBC
HEMATOCRIT: 26.4 % — AB (ref 36.0–46.0)
Hemoglobin: 7.9 g/dL — ABNORMAL LOW (ref 12.0–15.0)
MCH: 29.8 pg (ref 26.0–34.0)
MCHC: 29.9 g/dL — ABNORMAL LOW (ref 30.0–36.0)
MCV: 99.6 fL (ref 80.0–100.0)
Platelets: 163 10*3/uL (ref 150–400)
RBC: 2.65 MIL/uL — ABNORMAL LOW (ref 3.87–5.11)
RDW: 17 % — ABNORMAL HIGH (ref 11.5–15.5)
WBC: 10.6 10*3/uL — ABNORMAL HIGH (ref 4.0–10.5)
nRBC: 0 % (ref 0.0–0.2)

## 2018-04-26 LAB — COMPREHENSIVE METABOLIC PANEL
ALT: 8 U/L (ref 0–44)
AST: 16 U/L (ref 15–41)
Albumin: 2.5 g/dL — ABNORMAL LOW (ref 3.5–5.0)
Alkaline Phosphatase: 41 U/L (ref 38–126)
Anion gap: 8 (ref 5–15)
BUN: 33 mg/dL — ABNORMAL HIGH (ref 8–23)
CO2: 37 mmol/L — ABNORMAL HIGH (ref 22–32)
Calcium: 7.9 mg/dL — ABNORMAL LOW (ref 8.9–10.3)
Chloride: 97 mmol/L — ABNORMAL LOW (ref 98–111)
Creatinine, Ser: 0.81 mg/dL (ref 0.44–1.00)
GFR calc Af Amer: >60 mL/min (ref >60)
GFR calc non Af Amer: >60 mL/min (ref >60)
Glucose, Bld: 110 mg/dL — ABNORMAL HIGH (ref 70–99)
Potassium: 3.7 mmol/L (ref 3.5–5.1)
Sodium: 142 mmol/L (ref 135–145)
Total Bilirubin: 1.3 mg/dL — ABNORMAL HIGH (ref 0.3–1.2)
Total Protein: 5.8 g/dL — ABNORMAL LOW (ref 6.5–8.1)

## 2018-04-26 LAB — GLUCOSE, CAPILLARY
GLUCOSE-CAPILLARY: 117 mg/dL — AB (ref 70–99)
Glucose-Capillary: 106 mg/dL — ABNORMAL HIGH (ref 70–99)
Glucose-Capillary: 112 mg/dL — ABNORMAL HIGH (ref 70–99)
Glucose-Capillary: 116 mg/dL — ABNORMAL HIGH (ref 70–99)
Glucose-Capillary: 116 mg/dL — ABNORMAL HIGH (ref 70–99)
Glucose-Capillary: 95 mg/dL (ref 70–99)

## 2018-04-26 MED ORDER — ORAL CARE MOUTH RINSE: 15.0000 mL | Freq: Two times a day (BID) | OROMUCOSAL | Status: DC

## 2018-04-26 MED ORDER — ASPIRIN EC 81 MG PO TBEC
81.0000 mg | DELAYED_RELEASE_TABLET | Freq: Every day | ORAL | Status: DC
  Administered 2018-04-26 – 2018-04-27 (×2): 81 mg via ORAL
  Filled 2018-04-26 (×3): qty 1

## 2018-04-26 MED ORDER — CHLORHEXIDINE GLUCONATE 0.12 % MT SOLN
15.0000 mL | Freq: Two times a day (BID) | OROMUCOSAL | Status: DC
  Administered 2018-04-26 – 2018-04-27 (×2): 15 mL via OROMUCOSAL
  Filled 2018-04-26 (×3): qty 15

## 2018-04-26 MED ORDER — FUROSEMIDE 10 MG/ML IJ SOLN
20.0000 mg | Freq: Four times a day (QID) | INTRAMUSCULAR | Status: AC
  Administered 2018-04-26 (×3): 20 mg via INTRAVENOUS
  Filled 2018-04-26 (×3): qty 2

## 2018-04-26 MED ORDER — PANTOPRAZOLE SODIUM 40 MG PO TBEC
40.0000 mg | DELAYED_RELEASE_TABLET | Freq: Every day | ORAL | Status: DC
  Administered 2018-04-26 – 2018-04-27 (×2): 40 mg via ORAL
  Filled 2018-04-26 (×3): qty 1

## 2018-04-26 MED ORDER — POTASSIUM CHLORIDE 10 MEQ/50ML IV SOLN
10.0000 meq | INTRAVENOUS | Status: AC
  Administered 2018-04-26 (×4): 10 meq via INTRAVENOUS
  Filled 2018-04-26 (×4): qty 50

## 2018-04-26 NOTE — Procedures (Signed)
Extubation Procedure Note  Patient Details:   Name: BERENICE OEHLERT DOB: [birth year] MRN: [id number]   Airway Documentation:  Airway 7.5 mm (Active)  Secured at (cm) 22 cm 04/26/2018  7:28 AM  Measured From Lips 04/26/2018  7:28 AM  Secured Location Left 04/26/2018  7:28 AM  Secured By [employer] 04/26/2018  7:28 AM  Tube Holder Repositioned Yes 04/26/2018  7:28 AM  Cuff Pressure (cm H2O) 25 cm H2O 04/25/2018  8:04 AM  Site Condition Dry 04/26/2018  7:28 AM   Vent end date: 04/26/18 Vent end time: 0748   Evaluation  O2 sats: stable throughout  Complications: No apparent complications Patient did tolerate procedure well. Bilateral Breath Sounds: Diminished, Rhonchi   Yes  Lamonte Sakai 04/26/2018, 7:49 AM

## 2018-04-26 NOTE — Progress Notes (Addendum)
Progress Note  Patient Name: Elizabeth [name] Date of Encounter: 04/26/2018  Primary Cardiologist: Skeet Latch, MD  Subjective   Pt extubated, oriented, smiling. Feels mild chest pressure and feels warm. Plan per PCCM is to continue with vent today and extubated tomorrow then move to [facility] to begin surgical workup with cath and TCTS consultation  Inpatient Medications    Scheduled Meds: . aspirin EC  81 mg Oral Daily  . chlorhexidine  15 mL Mouth Rinse BID  . Chlorhexidine Gluconate Cloth  6 each Topical Daily  . furosemide  20 mg Intravenous Q6H  . heparin injection (subcutaneous)  5,000 Units Subcutaneous Q8H  . mouth rinse  15 mL Mouth Rinse q12n4p  . pantoprazole  40 mg Oral Daily  . sodium chloride flush  10-40 mL Intracatheter Q12H   Continuous Infusions: . sodium chloride Stopped (04/23/18 1444)  .  ceFAZolin (ANCEF) IV Stopped (04/26/18 0556)  . potassium chloride 10 mEq (04/26/18 1055)   PRN Meds: acetaminophen (TYLENOL) oral liquid 160 mg/5 mL, fentaNYL (SUBLIMAZE) injection, fentaNYL (SUBLIMAZE) injection   Vital Signs    Vitals:   04/26/18 0600 04/26/18 0700 04/26/18 0728 04/26/18 0800  BP: (!) 109/41 (!) 112/41  (!) 114/38  Pulse: 78 82 84 88  Resp: 20 18 17 15   Temp: (!) 100.9 F (38.3 C) (!) 100.9 F (38.3 C) (!) 100.8 F (38.2 C) (!) 100.9 F (38.3 C)  TempSrc:   Oral   SpO2: 97% 97% 97% 99%  Weight:      Height:        Intake/Output Summary (Last 24 hours) at 04/26/2018 1158 Last data filed at 04/26/2018 1131 Gross per 24 hour  Intake 896.6 ml  Output 1965 ml  Net -1068.4 ml   Filed Weights   04/24/18 0115 04/25/18 0410 04/26/18 0428  Weight: 120.4 kg 114.3 kg 114.8 kg    Physical Exam   General: Obese, well developed, well nourished, intubated sedated Skin: Warm, dry, intact  Head: Normocephalic, atraumatic, clear, moist mucus membranes. Neck: Negative for carotid bruits. No JVD Lungs:Clear to ausculation bilaterally. No  wheezes, rales, or rhonchi. Breathing is unlabored. Cardiovascular: RRR with S1 S2. + murmur. No rubs, gallops, or LV heave appreciated. Abdomen: Soft, non-tender, non-distended with normoactive bowel sounds. No obvious abdominal masses. MSK: Strength and tone appear normal for age. 5/5 in all extremities Extremities: 1+ BLE edema. No clubbing or cyanosis. DP/PT pulses 1+ bilaterally Neuro: Alert and oriented. No focal deficits. No facial asymmetry. MAE spontaneously. Psych: Responds to questions appropriately with normal affect/writing   Labs    Chemistry Recent Labs  Lab 04/24/18 1439 04/25/18 0411 04/26/18 0430  NA 139 140 142  K 3.6 3.2* 3.7  CL 96* 95* 97*  CO2 35* 36* 37*  GLUCOSE 111* 106* 110*  BUN 23 27* 33*  CREATININE 0.86 0.80 0.81  CALCIUM 8.1* 8.0* 7.9*  PROT  --   --  5.8*  ALBUMIN  --   --  2.5*  AST  --   --  16  ALT  --   --  8  ALKPHOS  --   --  41  BILITOT  --   --  1.3*  GFRNONAA >60 >60 >60  GFRAA >60 >60 >60  ANIONGAP 8 9 8      Hematology Recent Labs  Lab 04/23/18 0345 04/25/18 0411 04/26/18 0430  WBC 10.5 10.5 10.6*  RBC 2.92* 2.68* 2.65*  HGB 8.6* 7.9* 7.9*  HCT 28.6*  26.3* 26.4*  MCV 97.9 98.1 99.6  MCH 29.5 29.5 29.8  MCHC 30.1 30.0 29.9*  RDW 17.2* 16.9* 17.0*  PLT 95* 145* 163   Cardiac EnzymesNo results for input(s): TROPONINI in the last 168 hours. No results for input(s): TROPIPOC in the last 168 hours.   BNPNo results for input(s): BNP, PROBNP in the last 168 hours.   DDimer No results for input(s): DDIMER in the last 168 hours.   Radiology    Dg Chest Port 1 View  Result Date: 04/26/2018 CLINICAL DATA:  Respiratory failure EXAM: PORTABLE CHEST 1 VIEW COMPARISON:  04/25/2018 FINDINGS: Endotracheal tube terminates 3.9 cm above carina. Nasogastric tube extends beyond the inferior aspect of the film. Right PICC line tip at low SVC. Numerous leads and wires project over the chest. Reverse apical lordotic positioning with the  chin overlying the apices. Normal heart size for level of inspiration. Probable layering bilateral pleural effusions. No pneumothorax. Slight worsening aeration, with right greater than left interstitial and airspace disease. Somewhat more confluent, progressive right infrahilar and left lower lobe consolidation. IMPRESSION: 1. Worsened aeration, with increased interstitial and airspace disease. Pulmonary edema with possible infection. 2. Probable layering bilateral pleural effusions. Electronically Signed   By: Abigail Miyamoto M.D.   On: 04/26/2018 07:39   Portable Chest Xray  Result Date: 04/25/2018 CLINICAL DATA:  Respiratory distress. EXAM: PORTABLE CHEST 1 VIEW COMPARISON:  04/24/2018. FINDINGS: Unchanged cardiomegaly. Support tubes and lines are stable. BILATERAL pulmonary opacities are redemonstrated, and may be minimally improved, when technique differences are considered. Slight decreased LEFT effusion. RIGHT effusion also appears smaller. IMPRESSION: Slight improvement aeration. Electronically Signed   By: Staci Righter M.D.   On: 04/25/2018 07:07    Telemetry    04/25/18 NSR with  - Personally Reviewed  ECG    No new tracing as of 04/25/18- Personally Reviewed  Cardiac Studies   Echocardiogram 04/12/18: Study Conclusions  - Left ventricle: The cavity size was normal. Systolic function was normal. The estimated ejection fraction was in the range of 60% to 65%. Wall motion was normal; there were no regional wall motion abnormalities. - Aortic valve: Trileaflet; severely thickened, severely calcified leaflets. 1.5 x 2 cm calcified aortic mass noted (possible old calcified vegetation). This is new when compared to prior echocardiogram. Valve mobility was restricted. There was mild regurgitation. - Mitral valve: There was moderate regurgitation. - Tricuspid valve: There was moderate regurgitation.  Transesophageal Echocardiogram 04/17/18: Findings: Please see echo  section for full report. Normal LV size and wall thickness. EF 55-60% with normal wall motion. Normal right ventricular size and systolic function. Moderate left atrial enlargement, no LA appendage thrombus. Normal right atrium. No PFO or ASD by color doppler. Mild TR, peak RV-RA gradient 45 mmHg. No TV vegetation. There was moderate central mitral regurgitation. ERO by PISA was only 0.14 cm^2 but visually 3+ MR. There did not appear to be a mitral valve vegetation. No systolic flow reversal in the pulmonary vein doppler pattern. The aortic valve was trileaflet with a large vegetation that appeared to involve all leaflets. There did appear to be be perforation. There appeared to be severe, eccentric aortic insufficiency with holodiastolic flow reversal in the ascending thoracic aorta. Mean gradient across the aortic valve was 32 mmHg, suggesting moderate aortic stenosis (elevated gradient was likely due to a combination of high flow from severe AI and leaflet impingement from bulky vegetation.   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 3+ mitral regurgitation without vegetation noted.   Patient Profile     67 y.o. female with past medical history of hypertension, hyperlipidemia, previous lung cancer status post lobectomyandmild aortic stenosis was admitted on 04/09/2018 with left hip pain, fever and hypotension and diagnosed with septic arthritis and aortic valve endocarditis.  Her echocardiogram was performed on 04/12/2018 with finding of aortic valve endocarditis, this was followed by TEE on April 17, 2018 that showed normal LVEF.  There are large vegetations on at least 2 aortic valve leaflets with perforation of the left coronary cusp and severe eccentric aortic regurgitation.  She also has severe mitral regurgitation without any evidence of vegetation on the mitral valve.  The patient has been treated  with antibiotics for aortic valve endocarditis and septic arthritis, organism that grew from synovial fluid from the left hip was staplugdunenesis (rare). The patient is also having significant anemia at this secondary to acute endocarditis.  The patient developed acute respiratory failure yesterday with 2-1 block and eventually complete AV block, the patient was intubated, diuresed overnight 5 L. Overnight 3 hour episode of a-fib rate controlled, now in SR.   Assessment & Plan    1.  Aortic valve endocarditis: -Per echocardiogram 04/12/2017 and TEE, LVEF is 60 to 65% with no wall motion abnormalities however, there is aortic valve endocarditis, severe eccentric area and perforation with severely calcified aortic valve with a 1.5 x 2 cm calcified aortic mass noted when compared to prior echocardiogram with subsequent TEE performed for confirmation -Treatment for septic arthritis with staplugdunenesis (rare) seen in synovial fluid from 12/9, but blood cultures are negative so far -Tentative plan for antibiotics x4 weeks -04/24/18 pt decompensated secondary to first-degree AV block which progressed to 2-1 block>>PCCM consult and pt was intubated for respiratory safety>>follow up tele review showed ST -Repeat echocardiogram ordered 04/24/18 to assess for possible perivalvular abscess that could be causing AV block which showed nodular calcification of the leaflet tips with severe AS -Plan is to leave intubated on additional day, extubate tomorrow and transport to [facility] for surgical workup with cardiac catheterization and TCTS consultation for anticipated AS treatment   2.  Recurrent SVT>>>now with episodes of symptomatic AV block: -Per telemetry review, SR with no signs of SVT -EP, Dr. Lovena Le saw 04/14/2018 in which the patient was on IV amiodarone for both SVT and atrial tachycardia>> transitioned to PO amiodarone 04/18/2017>> should transition to 400 mg daily on 05/01/2017.  See below -Plan was for  amiodarone 400 mg twice daily x2 weeks then 400 mg daily for 2 weeks then 200 mg daily -Amiodarone discontinued in the setting of severe bradycardia and 1st degree AV block which progressed to 2-1 AV block  -Currently with 1st degree block with adequate rate   3.  Septic arthritis: -Per ID/Ortho/primary team -s/p I&D with washout 04/17/2017 -IV antibiotics per above  4.  Volume overload: -Weight, 251lb today, down from 265lb yesterday -I&O, net positive 846L since admission>>>total 24H output of 5.1L  -Continue IV Lasix 40mg  twice daily  -Creatinine, 0.86 today    Plan: - continue diuresis with iv lasix, replace KCl, she has diuresed 6 L in the last 48 hours but is still fluid overloaded, Crea is stable, vitals are stable - anemia stable Hb 7.9 - extubated today, doing great - transfer to [facility] for a cardiac cath and CT surgery of aortic valve endocarditis - continue iv ATB - during this hospitalization - nsVT, PAF (3 hours total) and  2:1 AVB, we will continue to follow, nothing in the last 48 hours, currently 1.AVB with long PR interval, occasional blocked P wave  Ena Dawley, MD 04/26/2018

## 2018-04-26 NOTE — H&P (View-Only) (Signed)
Progress Note  Patient Name: Elizabeth [name] Date of Encounter: 04/26/2018  Primary Cardiologist: Skeet Latch, MD  Subjective   Pt extubated, oriented, smiling. Feels mild chest pressure and feels warm. Plan per PCCM is to continue with vent today and extubated tomorrow then move to [facility] to begin surgical workup with cath and TCTS consultation  Inpatient Medications    Scheduled Meds: . aspirin EC  81 mg Oral Daily  . chlorhexidine  15 mL Mouth Rinse BID  . Chlorhexidine Gluconate Cloth  6 each Topical Daily  . furosemide  20 mg Intravenous Q6H  . heparin injection (subcutaneous)  5,000 Units Subcutaneous Q8H  . mouth rinse  15 mL Mouth Rinse q12n4p  . pantoprazole  40 mg Oral Daily  . sodium chloride flush  10-40 mL Intracatheter Q12H   Continuous Infusions: . sodium chloride Stopped (04/23/18 1444)  .  ceFAZolin (ANCEF) IV Stopped (04/26/18 0556)  . potassium chloride 10 mEq (04/26/18 1055)   PRN Meds: acetaminophen (TYLENOL) oral liquid 160 mg/5 mL, fentaNYL (SUBLIMAZE) injection, fentaNYL (SUBLIMAZE) injection   Vital Signs    Vitals:   04/26/18 0600 04/26/18 0700 04/26/18 0728 04/26/18 0800  BP: (!) 109/41 (!) 112/41  (!) 114/38  Pulse: 78 82 84 88  Resp: 20 18 17 15   Temp: (!) 100.9 F (38.3 C) (!) 100.9 F (38.3 C) (!) 100.8 F (38.2 C) (!) 100.9 F (38.3 C)  TempSrc:   Oral   SpO2: 97% 97% 97% 99%  Weight:      Height:        Intake/Output Summary (Last 24 hours) at 04/26/2018 1158 Last data filed at 04/26/2018 1131 Gross per 24 hour  Intake 896.6 ml  Output 1965 ml  Net -1068.4 ml   Filed Weights   04/24/18 0115 04/25/18 0410 04/26/18 0428  Weight: 120.4 kg 114.3 kg 114.8 kg    Physical Exam   General: Obese, well developed, well nourished, intubated sedated Skin: Warm, dry, intact  Head: Normocephalic, atraumatic, clear, moist mucus membranes. Neck: Negative for carotid bruits. No JVD Lungs:Clear to ausculation bilaterally. No  wheezes, rales, or rhonchi. Breathing is unlabored. Cardiovascular: RRR with S1 S2. + murmur. No rubs, gallops, or LV heave appreciated. Abdomen: Soft, non-tender, non-distended with normoactive bowel sounds. No obvious abdominal masses. MSK: Strength and tone appear normal for age. 5/5 in all extremities Extremities: 1+ BLE edema. No clubbing or cyanosis. DP/PT pulses 1+ bilaterally Neuro: Alert and oriented. No focal deficits. No facial asymmetry. MAE spontaneously. Psych: Responds to questions appropriately with normal affect/writing   Labs    Chemistry Recent Labs  Lab 04/24/18 1439 04/25/18 0411 04/26/18 0430  NA 139 140 142  K 3.6 3.2* 3.7  CL 96* 95* 97*  CO2 35* 36* 37*  GLUCOSE 111* 106* 110*  BUN 23 27* 33*  CREATININE 0.86 0.80 0.81  CALCIUM 8.1* 8.0* 7.9*  PROT  --   --  5.8*  ALBUMIN  --   --  2.5*  AST  --   --  16  ALT  --   --  8  ALKPHOS  --   --  41  BILITOT  --   --  1.3*  GFRNONAA >60 >60 >60  GFRAA >60 >60 >60  ANIONGAP 8 9 8      Hematology Recent Labs  Lab 04/23/18 0345 04/25/18 0411 04/26/18 0430  WBC 10.5 10.5 10.6*  RBC 2.92* 2.68* 2.65*  HGB 8.6* 7.9* 7.9*  HCT 28.6*  26.3* 26.4*  MCV 97.9 98.1 99.6  MCH 29.5 29.5 29.8  MCHC 30.1 30.0 29.9*  RDW 17.2* 16.9* 17.0*  PLT 95* 145* 163   Cardiac EnzymesNo results for input(s): TROPONINI in the last 168 hours. No results for input(s): TROPIPOC in the last 168 hours.   BNPNo results for input(s): BNP, PROBNP in the last 168 hours.   DDimer No results for input(s): DDIMER in the last 168 hours.   Radiology    Dg Chest Port 1 View  Result Date: 04/26/2018 CLINICAL DATA:  Respiratory failure EXAM: PORTABLE CHEST 1 VIEW COMPARISON:  04/25/2018 FINDINGS: Endotracheal tube terminates 3.9 cm above carina. Nasogastric tube extends beyond the inferior aspect of the film. Right PICC line tip at low SVC. Numerous leads and wires project over the chest. Reverse apical lordotic positioning with the  chin overlying the apices. Normal heart size for level of inspiration. Probable layering bilateral pleural effusions. No pneumothorax. Slight worsening aeration, with right greater than left interstitial and airspace disease. Somewhat more confluent, progressive right infrahilar and left lower lobe consolidation. IMPRESSION: 1. Worsened aeration, with increased interstitial and airspace disease. Pulmonary edema with possible infection. 2. Probable layering bilateral pleural effusions. Electronically Signed   By: Abigail Miyamoto M.D.   On: 04/26/2018 07:39   Portable Chest Xray  Result Date: 04/25/2018 CLINICAL DATA:  Respiratory distress. EXAM: PORTABLE CHEST 1 VIEW COMPARISON:  04/24/2018. FINDINGS: Unchanged cardiomegaly. Support tubes and lines are stable. BILATERAL pulmonary opacities are redemonstrated, and may be minimally improved, when technique differences are considered. Slight decreased LEFT effusion. RIGHT effusion also appears smaller. IMPRESSION: Slight improvement aeration. Electronically Signed   By: Staci Righter M.D.   On: 04/25/2018 07:07    Telemetry    04/25/18 NSR with  - Personally Reviewed  ECG    No new tracing as of 04/25/18- Personally Reviewed  Cardiac Studies   Echocardiogram 04/12/18: Study Conclusions  - Left ventricle: The cavity size was normal. Systolic function was normal. The estimated ejection fraction was in the range of 60% to 65%. Wall motion was normal; there were no regional wall motion abnormalities. - Aortic valve: Trileaflet; severely thickened, severely calcified leaflets. 1.5 x 2 cm calcified aortic mass noted (possible old calcified vegetation). This is new when compared to prior echocardiogram. Valve mobility was restricted. There was mild regurgitation. - Mitral valve: There was moderate regurgitation. - Tricuspid valve: There was moderate regurgitation.  Transesophageal Echocardiogram 04/17/18: Findings: Please see echo  section for full report. Normal LV size and wall thickness. EF 55-60% with normal wall motion. Normal right ventricular size and systolic function. Moderate left atrial enlargement, no LA appendage thrombus. Normal right atrium. No PFO or ASD by color doppler. Mild TR, peak RV-RA gradient 45 mmHg. No TV vegetation. There was moderate central mitral regurgitation. ERO by PISA was only 0.14 cm^2 but visually 3+ MR. There did not appear to be a mitral valve vegetation. No systolic flow reversal in the pulmonary vein doppler pattern. The aortic valve was trileaflet with a large vegetation that appeared to involve all leaflets. There did appear to be be perforation. There appeared to be severe, eccentric aortic insufficiency with holodiastolic flow reversal in the ascending thoracic aorta. Mean gradient across the aortic valve was 32 mmHg, suggesting moderate aortic stenosis (elevated gradient was likely due to a combination of high flow from severe AI and leaflet impingement from bulky vegetation.   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 3+ mitral regurgitation without vegetation noted.   Patient Profile     67 y.o. female with past medical history of hypertension, hyperlipidemia, previous lung cancer status post lobectomyandmild aortic stenosis was admitted on 04/09/2018 with left hip pain, fever and hypotension and diagnosed with septic arthritis and aortic valve endocarditis.  Her echocardiogram was performed on 04/12/2018 with finding of aortic valve endocarditis, this was followed by TEE on April 17, 2018 that showed normal LVEF.  There are large vegetations on at least 2 aortic valve leaflets with perforation of the left coronary cusp and severe eccentric aortic regurgitation.  She also has severe mitral regurgitation without any evidence of vegetation on the mitral valve.  The patient has been treated  with antibiotics for aortic valve endocarditis and septic arthritis, organism that grew from synovial fluid from the left hip was staplugdunenesis (rare). The patient is also having significant anemia at this secondary to acute endocarditis.  The patient developed acute respiratory failure yesterday with 2-1 block and eventually complete AV block, the patient was intubated, diuresed overnight 5 L. Overnight 3 hour episode of a-fib rate controlled, now in SR.   Assessment & Plan    1.  Aortic valve endocarditis: -Per echocardiogram 04/12/2017 and TEE, LVEF is 60 to 65% with no wall motion abnormalities however, there is aortic valve endocarditis, severe eccentric area and perforation with severely calcified aortic valve with a 1.5 x 2 cm calcified aortic mass noted when compared to prior echocardiogram with subsequent TEE performed for confirmation -Treatment for septic arthritis with staplugdunenesis (rare) seen in synovial fluid from 12/9, but blood cultures are negative so far -Tentative plan for antibiotics x4 weeks -04/24/18 pt decompensated secondary to first-degree AV block which progressed to 2-1 block>>PCCM consult and pt was intubated for respiratory safety>>follow up tele review showed ST -Repeat echocardiogram ordered 04/24/18 to assess for possible perivalvular abscess that could be causing AV block which showed nodular calcification of the leaflet tips with severe AS -Plan is to leave intubated on additional day, extubate tomorrow and transport to [facility] for surgical workup with cardiac catheterization and TCTS consultation for anticipated AS treatment   2.  Recurrent SVT>>>now with episodes of symptomatic AV block: -Per telemetry review, SR with no signs of SVT -EP, Dr. Lovena Le saw 04/14/2018 in which the patient was on IV amiodarone for both SVT and atrial tachycardia>> transitioned to PO amiodarone 04/18/2017>> should transition to 400 mg daily on 05/01/2017.  See below -Plan was for  amiodarone 400 mg twice daily x2 weeks then 400 mg daily for 2 weeks then 200 mg daily -Amiodarone discontinued in the setting of severe bradycardia and 1st degree AV block which progressed to 2-1 AV block  -Currently with 1st degree block with adequate rate   3.  Septic arthritis: -Per ID/Ortho/primary team -s/p I&D with washout 04/17/2017 -IV antibiotics per above  4.  Volume overload: -Weight, 251lb today, down from 265lb yesterday -I&O, net positive 846L since admission>>>total 24H output of 5.1L  -Continue IV Lasix 40mg  twice daily  -Creatinine, 0.86 today    Plan: - continue diuresis with iv lasix, replace KCl, she has diuresed 6 L in the last 48 hours but is still fluid overloaded, Crea is stable, vitals are stable - anemia stable Hb 7.9 - extubated today, doing great - transfer to [facility] for a cardiac cath and CT surgery of aortic valve endocarditis - continue iv ATB - during this hospitalization - nsVT, PAF (3 hours total) and  2:1 AVB, we will continue to follow, nothing in the last 48 hours, currently 1.AVB with long PR interval, occasional blocked P wave  Ena Dawley, MD 04/26/2018

## 2018-04-27 DIAGNOSIS — I498 Other specified cardiac arrhythmias: Secondary | ICD-10-CM

## 2018-04-27 DIAGNOSIS — D72829 Elevated white blood cell count, unspecified: Secondary | ICD-10-CM

## 2018-04-27 DIAGNOSIS — B9561 Methicillin susceptible Staphylococcus aureus infection as the cause of diseases classified elsewhere: Secondary | ICD-10-CM

## 2018-04-27 DIAGNOSIS — J810 Acute pulmonary edema: Secondary | ICD-10-CM

## 2018-04-27 LAB — GLUCOSE, CAPILLARY
Glucose-Capillary: 110 mg/dL — ABNORMAL HIGH (ref 70–99)
Glucose-Capillary: 105 mg/dL — ABNORMAL HIGH (ref 70–99)
Glucose-Capillary: 115 mg/dL — ABNORMAL HIGH (ref 70–99)

## 2018-04-27 LAB — BASIC METABOLIC PANEL
Anion gap: 9 (ref 5–15)
BUN: 29 mg/dL — ABNORMAL HIGH (ref 8–23)
CO2: 34 mmol/L — ABNORMAL HIGH (ref 22–32)
Calcium: 8 mg/dL — ABNORMAL LOW (ref 8.9–10.3)
Chloride: 95 mmol/L — ABNORMAL LOW (ref 98–111)
Creatinine, Ser: 1 mg/dL (ref 0.44–1.00)
GFR calc Af Amer: >60 mL/min (ref >60)
GFR calc non Af Amer: 58 mL/min — ABNORMAL LOW (ref >60)
Glucose, Bld: 109 mg/dL — ABNORMAL HIGH (ref 70–99)
Potassium: 4.2 mmol/L (ref 3.5–5.1)
Sodium: 138 mmol/L (ref 135–145)

## 2018-04-27 LAB — CBC
HCT: 26.7 % — ABNORMAL LOW (ref 36.0–46.0)
HEMOGLOBIN: 7.9 g/dL — AB (ref 12.0–15.0)
MCH: 30 pg (ref 26.0–34.0)
MCHC: 29.6 g/dL — ABNORMAL LOW (ref 30.0–36.0)
MCV: 101.5 fL — ABNORMAL HIGH (ref 80.0–100.0)
Platelets: 176 10*3/uL (ref 150–400)
RBC: 2.63 MIL/uL — ABNORMAL LOW (ref 3.87–5.11)
RDW: 17.2 % — ABNORMAL HIGH (ref 11.5–15.5)
WBC: 13.3 10*3/uL — ABNORMAL HIGH (ref 4.0–10.5)
nRBC: 0 % (ref 0.0–0.2)

## 2018-04-27 LAB — MAGNESIUM: Magnesium: 2.3 mg/dL (ref 1.7–2.4)

## 2018-04-27 LAB — PHOSPHORUS: PHOSPHORUS: 3.7 mg/dL (ref 2.5–4.6)

## 2018-04-27 MED ORDER — SODIUM CHLORIDE 0.9% FLUSH: 3.0000 mL | Freq: Two times a day (BID) | INTRAVENOUS | Status: DC

## 2018-04-27 MED ORDER — SODIUM CHLORIDE 0.9% FLUSH: 3.0000 mL | INTRAVENOUS | Status: DC | PRN

## 2018-04-27 MED ORDER — ALPRAZOLAM 0.25 MG PO TABS
0.2500 mg | ORAL_TABLET | Freq: Three times a day (TID) | ORAL | Status: DC | PRN
  Administered 2018-04-27 – 2018-05-11 (×8): 0.25 mg via ORAL
  Filled 2018-04-27 (×8): qty 1

## 2018-04-27 MED ORDER — ASPIRIN 81 MG PO CHEW
81.0000 mg | CHEWABLE_TABLET | ORAL | Status: AC
  Administered 2018-04-28: 81 mg via ORAL
  Filled 2018-04-27: qty 1

## 2018-04-27 MED ORDER — POTASSIUM CHLORIDE CRYS ER 20 MEQ PO TBCR
40.0000 meq | EXTENDED_RELEASE_TABLET | Freq: Four times a day (QID) | ORAL | Status: AC
  Administered 2018-04-27 (×2): 40 meq via ORAL
  Filled 2018-04-27 (×3): qty 2

## 2018-04-27 MED ORDER — FUROSEMIDE 10 MG/ML IJ SOLN
40.0000 mg | Freq: Four times a day (QID) | INTRAMUSCULAR | Status: AC
  Administered 2018-04-27 (×3): 40 mg via INTRAVENOUS
  Filled 2018-04-27 (×3): qty 4

## 2018-04-27 MED ORDER — SODIUM CHLORIDE 0.9 % IV SOLN: 250.0000 mL | INTRAVENOUS | Status: DC | PRN

## 2018-04-27 MED ORDER — SODIUM CHLORIDE 0.9 % IV SOLN
INTRAVENOUS | Status: DC
  Administered 2018-04-28: 07:00:00 via INTRAVENOUS

## 2018-04-27 MED ORDER — FENTANYL CITRATE (PF) 100 MCG/2ML IJ SOLN
50.0000 ug | INTRAMUSCULAR | Status: DC | PRN
  Administered 2018-04-27: 50 ug via INTRAVENOUS
  Filled 2018-04-27: qty 2

## 2018-04-27 NOTE — Plan of Care (Signed)
Patients vitals remained stable through night.  Pressures were a little soft but required no intervention.  PICC line dressing was changed with no complication.  [facility] for further evaluation of heart valve vegetation.

## 2018-04-27 NOTE — Progress Notes (Signed)
Received Elizabeth [name] from [facility] ICU via [place].  Awake, alert and oriented x 4.  MOE x4.  Denies any pain at this time.  Placed on cardiac monitor, and contacted CCMD, VVS.  Skin care given, foam dressing to sacrum with small skin tear.  Surgical wound to left hip with moderate bruising noted around site, site clean and dry.  PICC line to right upper arm intact, connections checked, site dressing CDI.  Orientation to room given, verbalized understanding.

## 2018-04-27 NOTE — Clinical Social Work Note (Signed)
Clinical Social Work Assessment  Patient Details  Name: Elizabeth [name] MRN: [id number] Date of Birth: 02-Aug-1950  Date of referral:  04/27/18               Reason for consult:  Facility Placement                Permission sought to share information with:  Family Supports Permission granted to share information::  Yes, Verbal Permission Granted  Name::     Ambulance person::  [employer]  Relationship::  Daughter  Contact Information:     Housing/Transportation Living arrangements for the past 2 months:  Single Family Home Source of Information:  Patient, Adult Children Patient Interpreter Needed:  None Criminal Activity/Legal Involvement Pertinent to Current Situation/Hospitalization:  No - Comment as needed Significant Relationships:  Adult Children Lives with:  Self Do you feel safe going back to the place where you live?  Yes Need for family participation in patient care:  Yes (Comment)  Care giving concerns:    Patient recently coded and is scared that something else may happen to her. Patient and daughter would like for the patient to go to a skilled nursing facility so that the patient is able to regain some of her strength. Patient's daughter is willing to help out her mother but she is moving out of the home that they shared. Patient's daughter is concerned for her mothers help. She reported that her mother was having pain in her hip and she went to urgent care and received a shot in her hip before they went on vacation. They came back from vacation and the patient rapidly declined.   CSW met with them both at bedside. Patient was alert and oriented. Patient's daughter was asking questions. CSW explained about skilled nursing facilities and explained the process. CSW provided the patients daughter a copy of a SNF list for [facility] and put a copy in her shadow chart.    Social Worker assessment / plan:    CSW met with the family at bedside and completed her assessment. CSW  completed the Fl2 note and will send out to [employer]. CSW will need to follow back up with the patient and her daughter for possibly more facility choices.   Employment status:  Disabled (Comment on whether or not currently receiving Disability) Insurance information:  Medicare PT Recommendations:  [place] / Referral to community resources:     Patient/Family's Response to care:  Patient and daughter are understanding about the patient's current health condition. Patient wants to get back to baseline.   Patient/Family's Understanding of and Emotional Response to Diagnosis, Current Treatment, and Prognosis:  Patient and daughter are both understanding. Patient wants to regain her strength and get back home as quickly as possible.   Emotional Assessment Appearance:  Appears stated age Attitude/Demeanor/Rapport:    Affect (typically observed):  Calm, Pleasant Orientation:  Oriented to Self, Oriented to Place, Oriented to  Time, Oriented to Situation Alcohol / Substance use:  Not Applicable Psych involvement (Current and /or in the community):  No (Comment)  Discharge Needs  Concerns to be addressed:  No discharge needs identified Readmission within the last 30 days:  No Current discharge risk:  None Barriers to Discharge:  Continued Medical Work up, [place], [place] 04/27/2018, 3:24 PM

## 2018-04-27 NOTE — Progress Notes (Addendum)
NAME:  HUNTER PINKARD, MRN:  [id number], DOB:  [birth year], LOS: 6 ADMISSION DATE:  04/09/2018, CONSULTATION DATE:  12/23 REFERRING MD:  Tyrell Antonio , CHIEF COMPLAINT:  Symptomatic bradycardia.    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  Had been treated with IV antibiotics, I&D of left hip and supportive care.  Critical care consulted on 12/23 emergently with symptomatic bradycardia, chest pain, and shortness of breath.  Past Medical History  Hypertension, hyperlipidemia, lung cancer status post lobectomy, mild aortic stenosis.   Significant Hospital Events   12/8 admitted abx started 12/16 I&D left hip; to [facility] 12/23 acute resp distress, symptomatic bradycardia; intubated for resp distress and chest pain  12/24 looks better. Working on weaning  12/25 weaning very well, will consider extubation today 12/26: stable to [facility]   Consults:  Cardiology  PCCM   Procedures:  OETT 12/23>>>12/25 Right PICC 12/12>>>  Significant Diagnostic Tests:   Micro Data:  Surgical culture 12/16>>no orgs BCX2 12/10: neg Left hip synovial 12/9:  staph Lugdunensis BCX2 12/8: staph Lugdunensis  Antimicrobials:  Ancef 12/9>>> vanc 12/8 to 12/9 Flagyl 12/8 to 12/9  Interim history/subjective:  Extubated yesterday on 12/25.  Still has intermittent chest discomfort and shortness of breath  Objective   Blood pressure (Abnormal) 107/46, pulse 91, temperature 98.3 F (36.8 C), temperature source Oral, resp. rate (Abnormal) 29, height 5\' 4"  (1.626 m), weight 115.4 kg, SpO2 98 %.        Intake/Output Summary (Last 24 hours) at 04/27/2018 1043 Last data filed at 04/27/2018 0753 Gross per 24 hour  Intake 480.61 ml  Output 2150 ml  Net -1669.39 ml   Filed Weights   04/25/18 0410 04/26/18 0428 04/27/18 0500  Weight: 114.3 kg 114.8 kg 115.4 kg   Examination: General:  Very pleasant awake 67 year old female resting in bed she is in no acute distress HEENT normocephalic atraumatic mucous membranes moist Cardiac: Regular rate and rhythm Abdomen: Soft nontender Pulmonary: Diminished bases no accessory use Extremities: Diminished edema brisk cap refill Neuro: Awake oriented no focal deficits  Resolved Hospital Problem list   Thrombocytopenia Symptomatic bradycardia Assessment & Plan:   Severe aortic insufficiency with aortic valve perforation in setting of aortic valve endocarditis secondary to septic arthritis. staph Lugdunensis growing in blood and left hip Plan She will moved to [facility] today Eventually needs cardiac catheterization and eventually valve replacement Aggressive diuresis to avoid volume overload Complete 4 weeks of Ancef  Acute respiratory distress w/ associated chest pain 2/2 Acute pulmonary edema -Volume status down to 1.6 L positive -Extubated 12/25 -I think she would benefit from nocturnal CPAP, to support possible underlying sleep apnea but also decrease cardiac workload Portable chest x-ray personally reviewed: Persistent right greater than left pulmonary edema noted on chest x-ray 12/25 Plan Continue to wean oxygen CPAP at at bedtime Mobilize Continue Lasix daily  Symptomatic bradycardia. Acute Diastolic HF Had been treated for supraventricular tachycardia with amiodarone. Now off rate control meds Plan Telemetry monitoring Repeat Lasix today  Anemia of critical Illness w/ FESO4 def Drifted down from 8/6 to 7.9 no clear source of bleeding Plan Transfuse as needed for hemoglobin less than 7 A.m. chemistry  intermittent fluid and electrolyte imbalance: Iatrogenic metabolic alkalosis, intermittent hypokalemia Plan Daily chemistries with ongoing diuresis  Mild leukocytosis Plan Trend fever curve watch CBC Further recommendations per infectious disease Best practice:  Diet: tubefeeds  12/23 Pain/Anxiety/Delirium protocol (  if indicated): 12/23 VAP protocol (if indicated): 12/23 DVT prophylaxis: [place] heparin resumed. Consider adding back IV heparin 12/23 if hgb stable  GI prophylaxis: PPI Glucose control: ssi Mobility: Out of bed Code Status: full code  Family Communication: update  Disposition no longer critically ill, will transfer her to [facility] stepdown unit.  Continue medical titration, continue antibiotics, thoracic surgery to follow.  Erick Colace ACNP-BC [facility] Pulmonary/Critical Care Pager # [phone] OR # [phone] if no answer  Attending Note:  67 year old female with endocarditis that developed respiratory failure due to pulmonary edema.  Patient was extubated on 12/25 and is doing much better this AM with bibasilar crackles.  I reviewed CXR myself, pulmonary edema noted.  Discussed with PCCM-NP.    Pulmonary edema:  - Lasix  - Strict I/O  Respiratory failure:  - CPAP at night  - Diureses  Hypoxemia:  - Titrate O2 for sat of 88-92%  - May need an ambulatory desat study prior to discharge for home O2  Endocarditis:  - Abx as ordered  - ID following  CAD:  - [facility]  Dispo: transfer to [facility] for cards and CVTS and to [facility] service with PCCM off 12/27.  Patient seen and examined, agree with above note.  I dictated the care and orders written for this patient under my direction.  Rush Farmer, [place]

## 2018-04-27 NOTE — Evaluation (Signed)
Physical Therapy Re-Evaluation Patient Details Name: Elizabeth [name] MRN: [id number] DOB: [birth year] Today's Date: 04/27/2018   History of Present Illness  67 yo female s/p R TKA 8/19, admitted 12/16 with septic arthritis of left hip, s/P I and D on 04/17/18.TEE 12/16.Marland Kitchen  Pt intubated 04/24/18 and extubated 04/26/18  Clinical Impression  Pt for re-evaluation following extubation.  Pt very cooperative and motivated to participate but fatigues easily.  Pt up to EOB with assist of 2 and balanced in bedside sitting x 15 min before requesting back to bed 2* fatigue.  Pt denies dizziness but unwilling to attempt further activity. Pt would benefit from follow up rehab at SNF level to maximize IND and safety prior to return home.    Follow Up Recommendations SNF    Equipment Recommendations  None recommended by PT    Recommendations for Other Services       Precautions / Restrictions Precautions Precautions: Fall;Knee Precaution Comments: left ant. hip incision; pt fatigues very easily Restrictions Weight Bearing Restrictions: No      Mobility  Bed Mobility Overal bed mobility: Needs Assistance Bed Mobility: Supine to Sit;Sit to Supine     Supine to sit: Mod assist;+2 for safety/equipment Sit to supine: Mod assist;+2 for safety/equipment   General bed mobility comments: cues for sequence; physical assist to manage LEs and to bring trunk up to sitting and to control descent back to bed  Transfers                 General transfer comment: deferred at pt request 2* fatigue  Ambulation/Gait                Stairs            Wheelchair Mobility    Modified Rankin (Stroke Patients Only)       Balance                                             Pertinent Vitals/Pain Pain Assessment: No/denies pain    Home Living Family/patient expects to be discharged to:: Private residence Living Arrangements: Children Available Help at Discharge:  Family Type of Home: House Home Access: Stairs to enter Entrance Stairs-Rails: None Entrance Stairs-Number of Steps: 2 small steps Home Layout: One level Home Equipment: Environmental consultant - 2 wheels;Bedside commode;Cane - single point;Shower seat      Prior Function Level of Independence: Needs assistance      ADL's / Homemaking Assistance Needed: family assisted with LB ADLs and to get onto tub seat.  Used reacher for pants  Comments: had completed OPPT, ambulated x 15' with cane     Hand Dominance   Dominant Hand: Right    Extremity/Trunk Assessment   Upper Extremity Assessment Upper Extremity Assessment: Generalized weakness    Lower Extremity Assessment Lower Extremity Assessment: Generalized weakness LLE Deficits / Details: required assistanc eto move the leg to bed edge    Cervical / Trunk Assessment Cervical / Trunk Assessment: Normal  Communication   Communication: No difficulties  Cognition Arousal/Alertness: Awake/alert Behavior During Therapy: Flat affect Overall Cognitive Status: Within Functional Limits for tasks assessed                                 General Comments: Pt relieved to sit up and get off back  for first time in days but fatigues easily and unwilling to attempt further activity      General Comments      Exercises General Exercises - Lower Extremity Ankle Circles/Pumps: AROM;Both;15 reps;Supine   Assessment/Plan    PT Assessment Patient needs continued PT services  PT Problem List Decreased strength;Decreased range of motion;Decreased activity tolerance;Decreased mobility;Decreased knowledge of use of DME;Obesity       PT Treatment Interventions DME instruction;Therapeutic exercise;Gait training;Functional mobility training;Therapeutic activities;Patient/family education    PT Goals (Current goals can be found in the Care Plan section)  Acute Rehab PT Goals Patient Stated Goal: Regain IND PT Goal Formulation: With  patient Time For Goal Achievement: 05/11/18 Potential to Achieve Goals: Good    Frequency Min 2X/week   Barriers to discharge        Co-evaluation               AM-PAC PT "6 Clicks" Mobility  Outcome Measure Help needed turning from your back to your side while in a flat bed without using bedrails?: A Lot Help needed moving from lying on your back to sitting on the side of a flat bed without using bedrails?: A Lot Help needed moving to and from a bed to a chair (including a wheelchair)?: A Lot Help needed standing up from a chair using your arms (e.g., wheelchair or bedside chair)?: A Lot Help needed to walk in hospital room?: Total Help needed climbing 3-5 steps with a railing? : Total 6 Click Score: 10    End of Session   Activity Tolerance: Patient limited by fatigue Patient left: in bed;with call bell/phone within reach Nurse Communication: Mobility status;Need for lift equipment PT Visit Diagnosis: Unsteadiness on feet (R26.81)    Time: 0935-1010 PT Time Calculation (min) (ACUTE ONLY): 35 min   Charges:   PT Evaluation $PT Re-evaluation: 1 Re-eval PT Treatments $Therapeutic Activity: 8-22 mins        Debe Coder PT Acute Rehabilitation Services Pager [phone] Office [phone]   Habib Kise 04/27/2018, 1:54 PM

## 2018-04-27 NOTE — Progress Notes (Signed)
    [place] for Infectious Disease   Reason for visit: Follow up on endocarditis  Interval History: WBC with small elevation of 13.3, afebrile > 24 hours; now extubated, on nasal cannula.  No associated rash or diarrhea.  Cefazolin day 18 Total antibiotics day 19   Physical Exam: Constitutional:  Vitals:   04/27/18 0500 04/27/18 0800  BP: (!) 108/40 (!) 107/46  Pulse: 93 91  Resp: (!) 32 (!) 29  Temp:  98.3 F (36.8 C)  SpO2: 99% 98%   patient appears in NAD Eyes: anicteric HENT: no thrush Respiratory: Normal respiratory effort; CTA B Cardiovascular: RRR GI: soft, nt, nd  Review of Systems: Constitutional: negative for fevers and chills Gastrointestinal: negative for nausea and diarrhea Integument/breast: negative for rash  Lab Results  Component Value Date   WBC 13.3 (H) 04/27/2018   HGB 7.9 (L) 04/27/2018   HCT 26.7 (L) 04/27/2018   MCV 101.5 (H) 04/27/2018   PLT 176 04/27/2018    Lab Results  Component Value Date   CREATININE 1.00 04/27/2018   BUN 29 (H) 04/27/2018   NA 138 04/27/2018   K 4.2 04/27/2018   CL 95 (L) 04/27/2018   CO2 34 (H) 04/27/2018    Lab Results  Component Value Date   ALT 8 04/26/2018   AST 16 04/26/2018   ALKPHOS 41 04/26/2018     Microbiology: Recent Results (from the past 240 hour(s))  Aerobic/Anaerobic Culture (surgical/deep wound)     Status: None   Collection Time: 04/17/18  7:52 PM  Result Value Ref Range Status   Specimen Description   Final    SYNOVIAL LEFT HIP Performed at [facility], [place] [address], [place], [place] [address]    Special Requests   Final    NONE Performed at [facility], [place] [address] [place], [place] [address]    Gram Stain   Final    FEW WBC PRESENT, PREDOMINANTLY PMN NO ORGANISMS SEEN    Culture   Final    No growth aerobically or anaerobically. Performed at [facility], [place] [address], [place],  [address]    Report Status  04/22/2018 FINAL  Final    Impression/Plan:  1. IE with MSSA - continuing on cefazolin.  Will need prolonged course. Improving leukocytosis.  No changes.  2. Leukocytosis - improving now and down to 13.2.    3.  Septic arthritis - stable joint and on appropriate treatment as above.    4.  AV dysfunction - going to [facility] soon for cath, will need dental evaluation, CVTS consultation.   Dr. Baxter Flattery on tomorrow [facility] and Dirk Dress)

## 2018-04-27 NOTE — Plan of Care (Signed)
Care plans reviewed and patient is progressing.  

## 2018-04-27 NOTE — Progress Notes (Signed)
Patient setup on CPAP for first time. Setup for Auto mode with range of 4 cmH2O and 14 cmH2O with a 4L O2 Bleed in. Patient has no prior sleep study or occurrence of wearing CPAP noted, so she per protocol is to wear Nasal CPAP and is current;y tolerating well. Explained if she started having issues to let RN know and we would accommodate as much as we could within protocol limits. Patient currently doing well and trying to relax to see how she will do tonight.

## 2018-04-27 NOTE — Progress Notes (Addendum)
Progress Note  Patient Name: Elizabeth [name] Date of Encounter: 04/27/2018  Primary Cardiologist: Skeet Latch, MD   Subjective   Pt reports she is overall feeling much better. No current CP or dyspnea.   Inpatient Medications    Scheduled Meds: . aspirin EC  81 mg Oral Daily  . chlorhexidine  15 mL Mouth Rinse BID  . Chlorhexidine Gluconate Cloth  6 each Topical Daily  . heparin injection (subcutaneous)  5,000 Units Subcutaneous Q8H  . mouth rinse  15 mL Mouth Rinse q12n4p  . pantoprazole  40 mg Oral Daily  . sodium chloride flush  10-40 mL Intracatheter Q12H   Continuous Infusions: . sodium chloride Stopped (04/23/18 1444)  .  ceFAZolin (ANCEF) IV Stopped (04/27/18 0544)   PRN Meds: acetaminophen (TYLENOL) oral liquid 160 mg/5 mL, fentaNYL (SUBLIMAZE) injection, fentaNYL (SUBLIMAZE) injection   Vital Signs    Vitals:   04/27/18 0300 04/27/18 0400 04/27/18 0500 04/27/18 0800  BP: (!) 109/41 (!) 107/38 (!) 108/40 (!) 107/46  Pulse: 99 95 93 91  Resp: (!) 31 (!) 28 (!) 32 (!) 29  Temp:  98.2 F (36.8 C)  98.3 F (36.8 C)  TempSrc:  Oral  Oral  SpO2: 97% 98% 99% 98%  Weight:   115.4 kg   Height:        Intake/Output Summary (Last 24 hours) at 04/27/2018 0903 Last data filed at 04/27/2018 0753 Gross per 24 hour  Intake 480.61 ml  Output 2150 ml  Net -1669.39 ml   Filed Weights   04/25/18 0410 04/26/18 0428 04/27/18 0500  Weight: 114.3 kg 114.8 kg 115.4 kg    Telemetry    NSR, HR in the 80s, 1 missed beat, pause of 2.19 secs no SVT- Personally Reviewed  ECG    Not performed today - Personally Reviewed  Physical Exam   GEN: Morbidly obese female, in no acute distress.   Neck: No JVD Cardiac: RRR, + murmur   Respiratory: Clear to auscultation bilaterally. GI: Soft, nontender, non-distended  MS: obese lower extremities, No edema; No deformity. Neuro:  Nonfocal  Psych: Normal affect   Labs    Chemistry Recent Labs  Lab 04/25/18 0411  04/26/18 0430 04/27/18 0516  NA 140 142 138  K 3.2* 3.7 4.2  CL 95* 97* 95*  CO2 36* 37* 34*  GLUCOSE 106* 110* 109*  BUN 27* 33* 29*  CREATININE 0.80 0.81 1.00  CALCIUM 8.0* 7.9* 8.0*  PROT  --  5.8*  --   ALBUMIN  --  2.5*  --   AST  --  16  --   ALT  --  8  --   ALKPHOS  --  41  --   BILITOT  --  1.3*  --   GFRNONAA >60 >60 58*  GFRAA >60 >60 >60  ANIONGAP 9 8 9      Hematology Recent Labs  Lab 04/25/18 0411 04/26/18 0430 04/27/18 0516  WBC 10.5 10.6* 13.3*  RBC 2.68* 2.65* 2.63*  HGB 7.9* 7.9* 7.9*  HCT 26.3* 26.4* 26.7*  MCV 98.1 99.6 101.5*  MCH 29.5 29.8 30.0  MCHC 30.0 29.9* 29.6*  RDW 16.9* 17.0* 17.2*  PLT 145* 163 176    Cardiac EnzymesNo results for input(s): TROPONINI in the last 168 hours. No results for input(s): TROPIPOC in the last 168 hours.   BNPNo results for input(s): BNP, PROBNP in the last 168 hours.   DDimer No results for input(s): DDIMER in the last 168  hours.   Radiology    Dg Chest Port 1 View  Result Date: 04/26/2018 CLINICAL DATA:  Respiratory failure EXAM: PORTABLE CHEST 1 VIEW COMPARISON:  04/25/2018 FINDINGS: Endotracheal tube terminates 3.9 cm above carina. Nasogastric tube extends beyond the inferior aspect of the film. Right PICC line tip at low SVC. Numerous leads and wires project over the chest. Reverse apical lordotic positioning with the chin overlying the apices. Normal heart size for level of inspiration. Probable layering bilateral pleural effusions. No pneumothorax. Slight worsening aeration, with right greater than left interstitial and airspace disease. Somewhat more confluent, progressive right infrahilar and left lower lobe consolidation. IMPRESSION: 1. Worsened aeration, with increased interstitial and airspace disease. Pulmonary edema with possible infection. 2. Probable layering bilateral pleural effusions. Electronically Signed   By: Abigail Miyamoto M.D.   On: 04/26/2018 07:39    Cardiac Studies   Echocardiogram  04/12/18: Study Conclusions  - Left ventricle: The cavity size was normal. Systolic function was normal. The estimated ejection fraction was in the range of 60% to 65%. Wall motion was normal; there were no regional wall motion abnormalities. - Aortic valve: Trileaflet; severely thickened, severely calcified leaflets. 1.5 x 2 cm calcified aortic mass noted (possible old calcified vegetation). This is new when compared to prior echocardiogram. Valve mobility was restricted. There was mild regurgitation. - Mitral valve: There was moderate regurgitation. - Tricuspid valve: There was moderate regurgitation.  Transesophageal Echocardiogram 04/17/18: Findings: Please see echo section for full report. Normal LV size and wall thickness. EF 55-60% with normal wall motion. Normal right ventricular size and systolic function. Moderate left atrial enlargement, no LA appendage thrombus. Normal right atrium. No PFO or ASD by color doppler. Mild TR, peak RV-RA gradient 45 mmHg. No TV vegetation. There was moderate central mitral regurgitation. ERO by PISA was only 0.14 cm^2 but visually 3+ MR. There did not appear to be a mitral valve vegetation. No systolic flow reversal in the pulmonary vein doppler pattern. The aortic valve was trileaflet with a large vegetation that appeared to involve all leaflets. There did appear to be be perforation. There appeared to be severe, eccentric aortic insufficiency with holodiastolic flow reversal in the ascending thoracic aorta. Mean gradient across the aortic valve was 32 mmHg, suggesting moderate aortic stenosis (elevated gradient was likely due to a combination of high flow from severe AI and leaflet impingement from bulky vegetation.   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 3+ mitral regurgitation without vegetation noted.   Patient Profile     67  y.o.femalewith past medical history of hypertension, hyperlipidemia, previous lung cancer status post lobectomyandmild aortic stenosiswas admitted on 12/8/2019with left hip pain, fever and hypotensionand diagnosed with septic arthritis and aortic valve endocarditis. Her echocardiogram was performed on 12/11/2019with finding of aortic valve endocarditis, this was followed by TEE on April 17, 2018 that showed normal LVEF. There are large vegetations on at least 2 aortic valve leaflets with perforation of the left coronary cusp and severe eccentric aortic regurgitation. She also has severe mitral regurgitation without any evidence of vegetation on the mitral valve. The patient has been treated with antibiotics for aortic valve endocarditis and septic arthritis, organism that grew from synovial fluid from the left hip wasstaplugdunenesis (rare). The patient is also having significant anemia at this secondary to acute endocarditis.  Hospital course also notable for SVT,  development of acute respiratory failure with 2-1 block and eventually complete AV block  requiring intubation.   Assessment & Plan    1. Aortic Valve Endocarditis: -Per echocardiogram 04/12/2017 and TEE, LVEF is 60 to 65% with no wall motion abnormalities however,there is aortic valve endocarditis, severe eccentric area and perforationwithseverely calcified aortic valve with a 1.5 x 2 cm calcified aortic mass noted when compared to prior echocardiogram with subsequent TEE performed for confirmation -Treatment for septic arthritis with staplugdunenesis (rare) seen in synovial fluid from 12/9, but blood cultures are negative so far -Tentative plan for antibiotics x4 weeks -04/24/18 pt decompensated secondary to first-degree AV block which progressed to 2-1 block>>PCCM consult and pt was intubated for respiratory safety>>follow up tele review showed ST -Repeat echocardiogram ordered 04/24/18 to assess for possible perivalvular  abscess that could be causing AV block which showed nodular calcification of the leaflet tips with severe AS -Plan is for transport to [facility] for surgical workup with cardiac catheterization and TCTS consultation for anticipated AS treatment   2. Recurrent SVT: -Per telemetry review,SR with no signs of SVT - pt was on amiodarone but this was discontinued in the setting of severe bradycardia and 1st degree AV block which progressed to 2-1 AV block  -Currently with 1st degree block with adequate rate, 90s currently -Continue to monitor.   3. Septic Arthritis:  -Per ID/Ortho/primary team -s/pI&D with washout 04/17/2017 -IV antibiotics per above  4. Volume Overload:   - Pt has been extubated. Breathing stable.  -Significant diuresis, I&Os net negative 10.47 L since admit. 2.1L out in past 24 hrs - renal function and K WNL.  -Creatinine, 0.86 today  - No Lasix ordered today. Continue to monitor volume status closely.   Plan is for pt to be transferred to [facility] today. She is scheduled for [facility] with Dr.Cooper tomorrow @1 :30  and CT surgical consult to follow.   For questions or updates, please contact [place] Please consult [email or web address] for contact info under     Signed, Lyda Jester, PA-C  04/27/2018, 9:03 AM    The patient was seen, examined and discussed with Brittainy M. Rosita Fire, PA-C and I agree with the above.   - she continues to improve with SOB, appears less fluid overloaded but would continue diuresis with iv lasix, K normal toady,  she has diuresed 7.5 L in the last 48 hours but is still fluid overloaded, Crea is stable, vitals are stable - anemia stable Hb 7.9 - extubated yesterday and doing great - transfer to [facility] today, we will arrange for a left and right cardiac cath and CT surgery consult for aortic valve endocarditis tomorrow - continue iv ATB - during this hospitalization - nsVT, PAF (3 hours total) and 2:1 AVB, we will continue to follow, nothing in the  last 48 hours, currently 1. AVB with long PR interval, occasional blocked P wave  Ena Dawley, MD 04/27/2018

## 2018-04-27 NOTE — NC FL2 (Addendum)
[place] MEDICAID FL2 LEVEL OF CARE SCREENING TOOL     IDENTIFICATION  Patient Name: Elizabeth [name] Birthdate: January 11, 1951 Sex: female Admission Date (Current Location): 04/09/2018  [facility] and [place] Number:  Herbalist and Address:  [facility], [place] [address], [place], [place] [address]      Provider Number: [id number]  Attending Physician Name and Address:  Rush Farmer, MD  Relative Name and Phone Number:  Alver Sorrow, Daughter, [phone]    Current Level of Care: Hospital Recommended Level of Care: [place] Prior Approval Number: [id number]  Date Approved/Denied: 04/20/18 PASRR Number: [id number]  Discharge Plan: SNF    Current Diagnoses: Patient Active Problem List   Diagnosis Date Noted  . Acute pulmonary edema (HCC)   . Acute respiratory failure ([place])   . Cardiac arrest, cause unspecified ([place])   . Cardiogenic shock ([place])   . Atrioventricular block, complete ([place])   . Nonrheumatic aortic (valve) insufficiency   . SVT (supraventricular tachycardia) ([place])   . 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 Infective endocarditis   . Thrombocytopenia ([place])   . Anemia of chronic disease   . Chest tightness   . Fever   . Pressure injury of skin 04/10/2018  . Septic arthritis ([place]) 04/09/2018  . Acute kidney injury (AKI) with acute tubular necrosis (ATN) (HCC)   . Arterial hypotension   . Sepsis with acute organ dysfunction and septic shock (HCC)   . OA (osteoarthritis) of knee 12/12/2017  . Idiopathic chronic venous hypertension of both lower extremities with inflammation 06/29/2016  . Subtalar joint instability, left 06/29/2016  . Arthritis of right knee 01/30/2016  . Lower extremity edema 10/21/2015  . Leg cramps 08/21/2015  . Left ankle pain 04/09/2015  . Anxiety 01/20/2015  . Nonrheumatic aortic insufficiency with aortic stenosis 01/08/2015  . Obesity, Class III, BMI 40-49.9 (morbid obesity)  ([place]) 01/08/2015  . Varicose veins 01/02/2015  . Snoring 12/06/2014  . Viral gastroenteritis 04/06/2013  . Insect bite of ankle, right, infected 07/31/2012  . Primary cancer of right upper lobe of lung ([place]) 10/18/2011  . Left hip pain 06/29/2011  . Pain in joint, lower leg 03/17/2009  . GASTROENTERITIS 07/11/2008  . PULMONARY NODULE, SOLITARY 04/18/2008  . LIVER FUNCTION TESTS, ABNORMAL, HX OF 04/01/2008  . B12 DEFICIENCY 11/13/2007  . COUGH 11/13/2007  . UNSPECIFIED VITAMIN D DEFICIENCY 11/07/2007  . TINEA PEDIS 07/07/2007  . LYMPHADENOPATHY 11/16/2006  . Hyperlipidemia 09/15/2006  . Essential hypertension 09/15/2006  . LOW BACK PAIN 09/15/2006    Orientation RESPIRATION BLADDER Height & Weight     Self, Time, Situation, Place  Normal Continent Weight: 254 lb 6.6 oz (115.4 kg) Height:  5\' 4"  (162.6 cm)  BEHAVIORAL SYMPTOMS/MOOD NEUROLOGICAL BOWEL NUTRITION STATUS      Continent Diet(NPO was soft foods and thin liquids)  AMBULATORY STATUS COMMUNICATION OF NEEDS Skin   Independent Verbally Normal, Surgical wounds(Surgical incision on groin, buttocks, dressing treated with foam and changed daily)                       Personal Care Assistance Level of Assistance  Bathing, Feeding, Dressing, Total care Bathing Assistance: Maximum assistance Feeding assistance: Independent Dressing Assistance: Maximum assistance Total Care Assistance: Maximum assistance   Functional Limitations Info  Sight, Hearing, Speech Sight Info: Impaired(Has glasses) Hearing Info: Adequate Speech Info: Adequate    SPECIAL CARE FACTORS FREQUENCY  PT (By licensed PT), OT (By licensed  OT)     PT Frequency: 5x/wk OT Frequency: 5x/wk            Contractures Contractures Info: Not present    Additional Factors Info  Code Status, Allergies   Has O2: 97 Nasal cannula  Flow rate 4 Code Status Info: Full Code Allergies Info: Penicillins           Current Medications (04/27/2018):   This is the current hospital active medication list Current Facility-Administered Medications  Medication Dose Route Frequency Provider Last Rate Last Dose  . 0.9 %  sodium chloride infusion   Intravenous Continuous Regalado, Belkys A, MD   Stopped at 04/23/18 1444  . acetaminophen (TYLENOL) solution 650 mg  650 mg Per Tube Q6H PRN Rigoberto Noel, MD   650 mg at 04/26/18 1214  . aspirin EC tablet 81 mg  81 mg Oral Daily Rush Farmer, MD   81 mg at 04/27/18 1009  . ceFAZolin (ANCEF) IVPB 2g/100 mL premix  2 g Intravenous Q8H Gaynelle Arabian, MD   Stopped at 04/27/18 0544  . chlorhexidine (PERIDEX) 0.12 % solution 15 mL  15 mL Mouth Rinse BID Rush Farmer, MD   15 mL at 04/27/18 1009  . Chlorhexidine Gluconate Cloth 2 % PADS 6 each  6 each Topical Daily Gaynelle Arabian, MD   6 each at 04/26/18 2249  . furosemide (LASIX) injection 40 mg  40 mg Intravenous Q6H Erick Colace, NP   40 mg at 04/27/18 1221  . heparin injection 5,000 Units  5,000 Units Subcutaneous Q8H Erick Colace, NP   5,000 Units at 04/27/18 5465  . MEDLINE mouth rinse  15 mL Mouth Rinse q12n4p Rush Farmer, MD      . pantoprazole (PROTONIX) EC tablet 40 mg  40 mg Oral Daily Rush Farmer, MD   40 mg at 04/27/18 1009  . potassium chloride SA (K-DUR,KLOR-CON) CR tablet 40 mEq  40 mEq Oral Q6H Erick Colace, NP   40 mEq at 04/27/18 1221  . sodium chloride flush (NS) 0.9 % injection 10-40 mL  10-40 mL Intracatheter Q12H Gaynelle Arabian, MD   10 mL at 04/27/18 1010     Discharge Medications: Please see discharge summary for a list of discharge medications.  Relevant Imaging Results:  Relevant Lab Results:   Additional Information SSN: [id number]  Philippa Chester Jalie Eiland, LCSWA

## 2018-04-28 ENCOUNTER — Encounter (HOSPITAL_COMMUNITY)
Admission: EM | Disposition: A | Discharge status: Skilled Nursing Facility | Payer: Self-pay | Source: Home / Self Care | Attending: Surgery

## 2018-04-28 ENCOUNTER — Inpatient Hospital Stay (HOSPITAL_COMMUNITY): Payer: Medicare Other

## 2018-04-28 ENCOUNTER — Encounter (HOSPITAL_COMMUNITY): Payer: Self-pay | Admitting: Internal Medicine

## 2018-04-28 DIAGNOSIS — I339 Acute and subacute endocarditis, unspecified: Secondary | ICD-10-CM

## 2018-04-28 DIAGNOSIS — R1030 Lower abdominal pain, unspecified: Secondary | ICD-10-CM

## 2018-04-28 DIAGNOSIS — I2510 Atherosclerotic heart disease of native coronary artery without angina pectoris: Secondary | ICD-10-CM

## 2018-04-28 DIAGNOSIS — R109 Unspecified abdominal pain: Secondary | ICD-10-CM

## 2018-04-28 DIAGNOSIS — I509 Heart failure, unspecified: Secondary | ICD-10-CM

## 2018-04-28 DIAGNOSIS — Z8674 Personal history of sudden cardiac arrest: Secondary | ICD-10-CM

## 2018-04-28 DIAGNOSIS — I483 Typical atrial flutter: Secondary | ICD-10-CM

## 2018-04-28 DIAGNOSIS — I350 Nonrheumatic aortic (valve) stenosis: Secondary | ICD-10-CM

## 2018-04-28 HISTORY — PX: RIGHT/LEFT HEART CATH AND CORONARY ANGIOGRAPHY: CATH118266

## 2018-04-28 LAB — POCT I-STAT 3, VENOUS BLOOD GAS (G3P V)
ACID-BASE EXCESS: 10 mmol/L — AB (ref 0.0–2.0)
Acid-Base Excess: 9 mmol/L — ABNORMAL HIGH (ref 0.0–2.0)
Acid-Base Excess: 9 mmol/L — ABNORMAL HIGH (ref 0.0–2.0)
BICARBONATE: 33.2 mmol/L — AB (ref 20.0–28.0)
Bicarbonate: 33.9 mmol/L — ABNORMAL HIGH (ref 20.0–28.0)
Bicarbonate: 33.9 mmol/L — ABNORMAL HIGH (ref 20.0–28.0)
O2 Saturation: 47 %
O2 Saturation: 42 %
O2 Saturation: 43 %
PO2 VEN: 24 mmHg — AB (ref 32.0–45.0)
TCO2: 35 mmol/L — ABNORMAL HIGH (ref 22–32)
TCO2: 35 mmol/L — ABNORMAL HIGH (ref 22–32)
TCO2: 35 mmol/L — ABNORMAL HIGH (ref 22–32)
pCO2, Ven: 44.4 mmHg (ref 44.0–60.0)
pCO2, Ven: 44.9 mmHg (ref 44.0–60.0)
pCO2, Ven: 45.3 mmHg (ref 44.0–60.0)
pH, Ven: 7.482 — ABNORMAL HIGH (ref 7.250–7.430)
pH, Ven: 7.482 — ABNORMAL HIGH (ref 7.250–7.430)
pH, Ven: 7.486 — ABNORMAL HIGH (ref 7.250–7.430)
pO2, Ven: 22 mmHg — CL (ref 32.0–45.0)
pO2, Ven: 23 mmHg — CL (ref 32.0–45.0)

## 2018-04-28 LAB — CBC
HCT: 26.1 % — ABNORMAL LOW (ref 36.0–46.0)
Hemoglobin: 8.1 g/dL — ABNORMAL LOW (ref 12.0–15.0)
MCH: 30.8 pg (ref 26.0–34.0)
MCHC: 31 g/dL (ref 30.0–36.0)
MCV: 99.2 fL (ref 80.0–100.0)
Platelets: 198 10*3/uL (ref 150–400)
RBC: 2.63 MIL/uL — AB (ref 3.87–5.11)
RDW: 17.1 % — ABNORMAL HIGH (ref 11.5–15.5)
WBC: 15.8 10*3/uL — ABNORMAL HIGH (ref 4.0–10.5)
nRBC: 0 % (ref 0.0–0.2)

## 2018-04-28 LAB — POCT I-STAT 3, ART BLOOD GAS (G3+)
Acid-Base Excess: 8 mmol/L — ABNORMAL HIGH (ref 0.0–2.0)
Bicarbonate: 31.6 mmol/L — ABNORMAL HIGH (ref 20.0–28.0)
O2 Saturation: 94 %
TCO2: 33 mmol/L — ABNORMAL HIGH (ref 22–32)
pCO2 arterial: 39.4 mmHg (ref 32.0–48.0)
pH, Arterial: 7.513 — ABNORMAL HIGH (ref 7.350–7.450)
pO2, Arterial: 66 mmHg — ABNORMAL LOW (ref 83.0–108.0)

## 2018-04-28 LAB — BASIC METABOLIC PANEL
Anion gap: 12 (ref 5–15)
BUN: 34 mg/dL — ABNORMAL HIGH (ref 8–23)
CHLORIDE: 95 mmol/L — AB (ref 98–111)
CO2: 32 mmol/L (ref 22–32)
Calcium: 8 mg/dL — ABNORMAL LOW (ref 8.9–10.3)
Creatinine, Ser: 1.32 mg/dL — ABNORMAL HIGH (ref 0.44–1.00)
GFR calc Af Amer: 48 mL/min — ABNORMAL LOW (ref >60)
GFR calc non Af Amer: 41 mL/min — ABNORMAL LOW (ref >60)
Glucose, Bld: 107 mg/dL — ABNORMAL HIGH (ref 70–99)
Potassium: 4.8 mmol/L (ref 3.5–5.1)
Sodium: 139 mmol/L (ref 135–145)

## 2018-04-28 LAB — COOXEMETRY PANEL
Carboxyhemoglobin: 2.1 % — ABNORMAL HIGH (ref 0.5–1.5)
Methemoglobin: 1.6 % — ABNORMAL HIGH (ref 0.0–1.5)
O2 Saturation: 64.7 %
Total hemoglobin: 8.6 g/dL — ABNORMAL LOW (ref 12.0–16.0)

## 2018-04-28 LAB — APTT: aPTT: 34 seconds (ref 24–36)

## 2018-04-28 LAB — GLUCOSE, CAPILLARY: Glucose-Capillary: 127 mg/dL — ABNORMAL HIGH (ref 70–99)

## 2018-04-28 LAB — PREPARE RBC (CROSSMATCH)

## 2018-04-28 SURGERY — RIGHT/LEFT HEART CATH AND CORONARY ANGIOGRAPHY
Anesthesia: LOCAL

## 2018-04-28 MED ORDER — TRANEXAMIC ACID 1000 MG/10ML IV SOLN
1.5000 mg/kg/h | INTRAVENOUS | Status: AC
  Administered 2018-04-29: 1.5 mg/kg/h via INTRAVENOUS
  Filled 2018-04-28: qty 25

## 2018-04-28 MED ORDER — SODIUM CHLORIDE 0.9 % IV SOLN
0.0000 ug/min | INTRAVENOUS | Status: DC
  Administered 2018-04-28: 125 ug/min via INTRAVENOUS
  Administered 2018-04-28 (×2): 80 ug/min via INTRAVENOUS
  Filled 2018-04-28: qty 40
  Filled 2018-04-28: qty 4
  Filled 2018-04-28: qty 40

## 2018-04-28 MED ORDER — HEPARIN SODIUM (PORCINE) 1000 UNIT/ML IJ SOLN
INTRAMUSCULAR | Status: DC | PRN
  Administered 2018-04-28: 5000 [IU] via INTRAVENOUS

## 2018-04-28 MED ORDER — TRANEXAMIC ACID (OHS) BOLUS VIA INFUSION
15.0000 mg/kg | INTRAVENOUS | Status: AC
  Administered 2018-04-29: 1722 mg via INTRAVENOUS
  Filled 2018-04-28: qty 1722

## 2018-04-28 MED ORDER — INSULIN REGULAR(HUMAN) IN NACL 100-0.9 UT/100ML-% IV SOLN
INTRAVENOUS | Status: AC
  Administered 2018-04-29: 1 [IU]/h via INTRAVENOUS
  Filled 2018-04-28: qty 100

## 2018-04-28 MED ORDER — HEPARIN (PORCINE) 25000 UT/250ML-% IV SOLN
1250.0000 [IU]/h | INTRAVENOUS | Status: DC
  Administered 2018-04-28: 1250 [IU]/h via INTRAVENOUS
  Filled 2018-04-28 (×2): qty 250

## 2018-04-28 MED ORDER — IOHEXOL 350 MG/ML SOLN
INTRAVENOUS | Status: DC | PRN
  Administered 2018-04-28: 35 mL via INTRA_ARTERIAL

## 2018-04-28 MED ORDER — PLASMA-LYTE 148 IV SOLN
INTRAVENOUS | Status: DC
  Filled 2018-04-28: qty 2.5

## 2018-04-28 MED ORDER — SODIUM CHLORIDE 0.9% FLUSH
3.0000 mL | Freq: Two times a day (BID) | INTRAVENOUS | Status: DC
  Administered 2018-04-28: 3 mL via INTRAVENOUS

## 2018-04-28 MED ORDER — TEMAZEPAM 15 MG PO CAPS
15.0000 mg | ORAL_CAPSULE | Freq: Once | ORAL | Status: AC | PRN
  Administered 2018-04-28: 15 mg via ORAL
  Filled 2018-04-28: qty 1

## 2018-04-28 MED ORDER — POTASSIUM CHLORIDE 2 MEQ/ML IV SOLN
80.0000 meq | INTRAVENOUS | Status: DC
  Filled 2018-04-28: qty 40

## 2018-04-28 MED ORDER — HEPARIN SODIUM (PORCINE) 1000 UNIT/ML IJ SOLN
INTRAMUSCULAR | Status: AC
  Filled 2018-04-28: qty 1

## 2018-04-28 MED ORDER — TRANEXAMIC ACID (OHS) PUMP PRIME SOLUTION
2.0000 mg/kg | INTRAVENOUS | Status: DC
  Filled 2018-04-28: qty 2.3

## 2018-04-28 MED ORDER — ONDANSETRON HCL 4 MG/2ML IJ SOLN: 4.0000 mg | Freq: Four times a day (QID) | INTRAMUSCULAR | Status: DC | PRN

## 2018-04-28 MED ORDER — NITROGLYCERIN IN D5W 200-5 MCG/ML-% IV SOLN
2.0000 ug/min | INTRAVENOUS | Status: DC
  Filled 2018-04-28: qty 250

## 2018-04-28 MED ORDER — FUROSEMIDE 10 MG/ML IJ SOLN
40.0000 mg | Freq: Four times a day (QID) | INTRAMUSCULAR | Status: DC
  Administered 2018-04-28 (×2): 40 mg via INTRAVENOUS
  Filled 2018-04-28 (×2): qty 4

## 2018-04-28 MED ORDER — HEPARIN (PORCINE) IN NACL 1000-0.9 UT/500ML-% IV SOLN
INTRAVENOUS | Status: DC | PRN
  Administered 2018-04-28 (×2): 500 mL

## 2018-04-28 MED ORDER — VANCOMYCIN HCL 10 G IV SOLR
1500.0000 mg | INTRAVENOUS | Status: AC
  Administered 2018-04-29: 1500 mg via INTRAVENOUS
  Filled 2018-04-28: qty 1500

## 2018-04-28 MED ORDER — PHENYLEPHRINE HCL-NACL 20-0.9 MG/250ML-% IV SOLN
30.0000 ug/min | INTRAVENOUS | Status: DC
  Filled 2018-04-28: qty 250

## 2018-04-28 MED ORDER — VERAPAMIL HCL 2.5 MG/ML IV SOLN
INTRAVENOUS | Status: AC
  Filled 2018-04-28: qty 2

## 2018-04-28 MED ORDER — EPINEPHRINE PF 1 MG/ML IJ SOLN
0.0000 ug/min | INTRAVENOUS | Status: DC
  Filled 2018-04-28: qty 4

## 2018-04-28 MED ORDER — VERAPAMIL HCL 2.5 MG/ML IV SOLN
INTRAVENOUS | Status: DC | PRN
  Administered 2018-04-28: 10 mL via INTRA_ARTERIAL

## 2018-04-28 MED ORDER — MORPHINE SULFATE (PF) 2 MG/ML IV SOLN
1.0000 mg | INTRAVENOUS | Status: DC | PRN
  Filled 2018-04-28: qty 1

## 2018-04-28 MED ORDER — MILRINONE LACTATE IN DEXTROSE 20-5 MG/100ML-% IV SOLN
0.3000 ug/kg/min | INTRAVENOUS | Status: AC
  Administered 2018-04-29: .2 ug/kg/min via INTRAVENOUS
  Filled 2018-04-28: qty 100

## 2018-04-28 MED ORDER — SODIUM CHLORIDE 0.9 % IV SOLN: INTRAVENOUS | Status: AC

## 2018-04-28 MED ORDER — FUROSEMIDE 10 MG/ML IJ SOLN
INTRAMUSCULAR | Status: AC
  Filled 2018-04-28: qty 8

## 2018-04-28 MED ORDER — CHLORHEXIDINE GLUCONATE 0.12 % MT SOLN
15.0000 mL | Freq: Once | OROMUCOSAL | Status: AC
  Administered 2018-04-29: 15 mL via OROMUCOSAL
  Filled 2018-04-28: qty 15

## 2018-04-28 MED ORDER — HEPARIN (PORCINE) IN NACL 1000-0.9 UT/500ML-% IV SOLN
INTRAVENOUS | Status: AC
  Filled 2018-04-28: qty 1000

## 2018-04-28 MED ORDER — CHLORHEXIDINE GLUCONATE CLOTH 2 % EX PADS
6.0000 | MEDICATED_PAD | Freq: Once | CUTANEOUS | Status: AC
  Administered 2018-04-29: 6 via TOPICAL

## 2018-04-28 MED ORDER — MAGNESIUM SULFATE 50 % IJ SOLN
40.0000 meq | INTRAMUSCULAR | Status: DC
  Filled 2018-04-28: qty 9.85

## 2018-04-28 MED ORDER — DOPAMINE-DEXTROSE 3.2-5 MG/ML-% IV SOLN
0.0000 ug/kg/min | INTRAVENOUS | Status: DC
  Filled 2018-04-28: qty 250

## 2018-04-28 MED ORDER — NOREPINEPHRINE BITARTRATE 1 MG/ML IV SOLN
0.0000 ug/min | INTRAVENOUS | Status: DC
  Administered 2018-04-29: 2 ug/min via INTRAVENOUS
  Filled 2018-04-28 (×2): qty 4

## 2018-04-28 MED ORDER — FUROSEMIDE 10 MG/ML IJ SOLN
INTRAMUSCULAR | Status: DC | PRN
  Administered 2018-04-28: 80 mg via INTRAVENOUS

## 2018-04-28 MED ORDER — SODIUM CHLORIDE 0.9 % IV SOLN: 250.0000 mL | INTRAVENOUS | Status: DC | PRN

## 2018-04-28 MED ORDER — ~~LOC~~ CARDIAC SURGERY, PATIENT & FAMILY EDUCATION
Freq: Once | Status: AC
  Administered 2018-04-29
  Filled 2018-04-28: qty 1

## 2018-04-28 MED ORDER — LEVOFLOXACIN IN D5W 500 MG/100ML IV SOLN
500.0000 mg | INTRAVENOUS | Status: AC
  Administered 2018-04-29: 500 mg via INTRAVENOUS
  Filled 2018-04-28: qty 100

## 2018-04-28 MED ORDER — SODIUM CHLORIDE 0.9% FLUSH: 3.0000 mL | INTRAVENOUS | Status: DC | PRN

## 2018-04-28 MED ORDER — NOREPINEPHRINE BITARTRATE 1 MG/ML IV SOLN
0.0000 ug/min | INTRAVENOUS | Status: DC
  Filled 2018-04-28 (×2): qty 4

## 2018-04-28 MED ORDER — ACETAMINOPHEN 325 MG PO TABS: 650.0000 mg | ORAL_TABLET | ORAL | Status: DC | PRN

## 2018-04-28 MED ORDER — LIDOCAINE HCL (PF) 1 % IJ SOLN
INTRAMUSCULAR | Status: DC | PRN
  Administered 2018-04-28 (×2): 2 mL

## 2018-04-28 MED ORDER — DEXMEDETOMIDINE HCL IN NACL 400 MCG/100ML IV SOLN
0.1000 ug/kg/h | INTRAVENOUS | Status: AC
  Administered 2018-04-29: .3 ug/kg/h via INTRAVENOUS
  Filled 2018-04-28: qty 100

## 2018-04-28 MED ORDER — PHENYLEPHRINE HCL-NACL 10-0.9 MG/250ML-% IV SOLN
0.0000 ug/min | INTRAVENOUS | Status: DC
  Administered 2018-04-28: 20 ug/min via INTRAVENOUS
  Filled 2018-04-28: qty 250

## 2018-04-28 MED ORDER — SODIUM CHLORIDE 0.9 % IV SOLN: INTRAVENOUS | Status: DC

## 2018-04-28 MED ORDER — CHLORHEXIDINE GLUCONATE CLOTH 2 % EX PADS
6.0000 | MEDICATED_PAD | Freq: Once | CUTANEOUS | Status: AC
  Administered 2018-04-28: 6 via TOPICAL

## 2018-04-28 MED ORDER — SODIUM CHLORIDE 0.9 % IV SOLN
INTRAVENOUS | Status: DC
  Filled 2018-04-28: qty 30

## 2018-04-28 MED ORDER — LIDOCAINE HCL (PF) 1 % IJ SOLN
INTRAMUSCULAR | Status: AC
  Filled 2018-04-28: qty 30

## 2018-04-28 MED ORDER — BISACODYL 5 MG PO TBEC
5.0000 mg | DELAYED_RELEASE_TABLET | Freq: Once | ORAL | Status: AC
  Administered 2018-04-28: 5 mg via ORAL
  Filled 2018-04-28: qty 1

## 2018-04-28 SURGICAL SUPPLY — 14 items
CATH 5FR JL3.5 JR4 ANG PIG MP (CATHETERS) ×2 IMPLANT
CATH BALLN WEDGE 5F 110CM (CATHETERS) ×2 IMPLANT
DEVICE RAD COMP TR BAND LRG (VASCULAR PRODUCTS) ×2 IMPLANT
GLIDESHEATH SLEND SS 6F .021 (SHEATH) ×2 IMPLANT
GUIDEWIRE INQWIRE 1.5J.035X260 (WIRE) ×1 IMPLANT
HOVERMATT SINGLE USE (MISCELLANEOUS) ×2 IMPLANT
INQWIRE 1.5J .035X260CM (WIRE) ×2
KIT HEART LEFT (KITS) ×2 IMPLANT
KIT MICROPUNCTURE NIT STIFF (SHEATH) ×2 IMPLANT
PACK CARDIAC CATHETERIZATION (CUSTOM PROCEDURE TRAY) ×2 IMPLANT
SHEATH GLIDE SLENDER 4/5FR (SHEATH) ×2 IMPLANT
SHEATH PROBE COVER 6X72 (BAG) ×2 IMPLANT
TRANSDUCER W/STOPCOCK (MISCELLANEOUS) ×2 IMPLANT
TUBING CIL FLEX 10 FLL-RA (TUBING) ×2 IMPLANT

## 2018-04-28 NOTE — Progress Notes (Addendum)
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  She had been treated with IV antibiotics and had I&D of the left hip and supportive care.  On 12/23 she went into acute respiratory distress with symptomatic bradycardia and was intubated for respiratory distress and chest pain and was managed by critical care.  She was weaned off the ventilator and extubated by 12/25 and was transferred to progressive care by 12/26.  She has been followed by cardiology as well as infectious disease.  Plans are for cardiac catheterization today and eventual CT surgery consultation due to aortic valve endocarditis.  Her shortness of breath continues to improve and she is less volume overloaded with use of IV Lasix.  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 Severe aortic insufficiency with aortic valve perforation in the setting of endocarditis secondary to septic hip arthritis with staph lugdunensis.  Plan is to complete 4 weeks of Ancef per ID recommendations.  Cardiac catheterization per cardiology this  afternoon with eventual plans for aortic valve intervention with CV surgery soon thereafter.  Patient has been n.p.o. after midnight. 2. Mild leukocytosis-secondary to above.  Continue to trend with repeat CBC in a.m.  No fever noted overnight.  Continue on Ancef as recommended by ID. 3. Acute respiratory distress secondary to acute pulmonary edema-improved.  Continue to wean oxygen with CPAP at bedtime.  Patient did appear to rest well overnight.  There has not been much of a change to her volume status recorded overnight and she has diuresed approximately 7.5 L since 12/26.  She appears to be less volume overloaded-monitor status closely. 4. Recurrent SVT-currently in sinus rhythm.  Continue to monitor on telemetry. 5. Anemia of critical illness with iron deficiency-stable.  Currently stable with hemoglobin 8.1 this morning.  Transfuse as needed for hemoglobin less than 7.  Repeat CBC in a.m.  No overt bleeding identified.   DVT prophylaxis:Heparin Code Status: Full Family Communication: None at bedside Disposition Plan: Plans are for cardiac catheterization this afternoon along with eventual CT surgery evaluation.  Continue on Ancef as noted for total 4 weeks.   Consultants:   ID  PCCM  Cardiology  Procedures:   Left hip I&D with washout 12/16  Right PICC line 12/12  ETT 12/23-12/25  Antimicrobials:   Vancomycin 12/8-12/9  Flagyl 12/8-12/9  Ancef 12/9->   Subjective: Patient seen and evaluated today with no new acute complaints or concerns. No acute concerns or events noted overnight.  She has been n.p.o. overnight and states that she feels as though she is breathing much better and has slept  well overnight.  Objective: Vitals:   04/28/18 0400 04/28/18 0600 04/28/18 0620 04/28/18 0645  BP: (!) 123/56 107/62    Pulse: 89 87 88 87  Resp: (!) 36 (!) 29 (!) 31 (!) 26  Temp: 97.6 F (36.4 C)     TempSrc: Oral     SpO2: 97% 97% 95% 92%  Weight:   114.8 kg   Height:         Intake/Output Summary (Last 24 hours) at 04/28/2018 0656 Last data filed at 04/28/2018 1017 Gross per 24 hour  Intake 671.21 ml  Output 701 ml  Net -29.79 ml   Filed Weights   04/26/18 0428 04/27/18 0500 04/28/18 0620  Weight: 114.8 kg 115.4 kg 114.8 kg    Examination:  General exam: Appears calm and comfortable, obese Respiratory system: Clear to auscultation. Respiratory effort normal.  Currently on 3 L nasal cannula with oxygen saturation 92-95%. Cardiovascular system: S1 & S2 heard, RRR. No JVD, murmurs, rubs, gallops or clicks.  Scant pedal edema noted. Gastrointestinal system: Abdomen is nondistended, soft and nontender. No organomegaly or masses felt. Normal bowel sounds heard. Central nervous system: Alert and oriented. No focal neurological deficits. Extremities: Symmetric 5 x 5 power. Skin: No rashes, lesions or ulcers Psychiatry: Judgement and insight appear normal. Mood & affect appropriate.     Data Reviewed: I have personally reviewed following labs and imaging studies  CBC: Recent Labs  Lab 04/23/18 0345 04/25/18 0411 04/26/18 0430 04/27/18 0516 04/28/18 0355  WBC 10.5 10.5 10.6* 13.3* 15.8*  HGB 8.6* 7.9* 7.9* 7.9* 8.1*  HCT 28.6* 26.3* 26.4* 26.7* 26.1*  MCV 97.9 98.1 99.6 101.5* 99.2  PLT 95* 145* 163 176 510   Basic Metabolic Panel: Recent Labs  Lab 04/24/18 1103 04/24/18 1439 04/25/18 0411 04/25/18 1625 04/26/18 0430 04/27/18 0516 04/28/18 0355  NA  --  139 140  --  142 138 139  K  --  3.6 3.2*  --  3.7 4.2 4.8  CL  --  96* 95*  --  97* 95* 95*  CO2  --  35* 36*  --  37* 34* 32  GLUCOSE  --  111* 106*  --  110* 109* 107*  BUN  --  23 27*  --  33* 29* 34*  CREATININE  --  0.86 0.80  --  0.81 1.00 1.32*  CALCIUM  --  8.1* 8.0*  --  7.9* 8.0* 8.0*  MG 2.0  --  1.8 2.3  --  2.3  --   PHOS 4.0  --  4.0 3.5  --  3.7  --    GFR: Estimated Creatinine Clearance: 50.7 mL/min (A) (by C-G formula based on SCr of 1.32 mg/dL (H)). Liver  Function Tests: Recent Labs  Lab 04/26/18 0430  AST 16  ALT 8  ALKPHOS 41  BILITOT 1.3*  PROT 5.8*  ALBUMIN 2.5*   No results for input(s): LIPASE, AMYLASE in the last 168 hours. No results for input(s): AMMONIA in the last 168 hours. Coagulation Profile: No results for input(s): INR, PROTIME in the last 168 hours. Cardiac Enzymes: No results for input(s): CKTOTAL, CKMB, CKMBINDEX, TROPONINI in the last 168 hours. BNP (last 3 results) No results for input(s): PROBNP in the last 8760 hours. HbA1C: No results for input(s): HGBA1C in the last 72 hours. CBG: Recent Labs  Lab 04/26/18 1952 04/26/18 2331 04/27/18 0339 04/27/18 0804 04/27/18 1150  GLUCAP 116* 106* 110* 105* 115*   Lipid Profile: No results for  input(s): CHOL, HDL, LDLCALC, TRIG, CHOLHDL, LDLDIRECT in the last 72 hours. Thyroid Function Tests: No results for input(s): TSH, T4TOTAL, FREET4, T3FREE, THYROIDAB in the last 72 hours. Anemia Panel: No results for input(s): VITAMINB12, FOLATE, FERRITIN, TIBC, IRON, RETICCTPCT in the last 72 hours. Sepsis Labs: No results for input(s): PROCALCITON, LATICACIDVEN in the last 168 hours.  No results found for this or any previous visit (from the past 240 hour(s)).       Radiology Studies: No results found.      Scheduled Meds: . aspirin EC  81 mg Oral Daily  . Chlorhexidine Gluconate Cloth  6 each Topical Daily  . heparin injection (subcutaneous)  5,000 Units Subcutaneous Q8H  . pantoprazole  40 mg Oral Daily  . sodium chloride flush  10-40 mL Intracatheter Q12H   Continuous Infusions: . sodium chloride Stopped (04/23/18 1444)  . sodium chloride 10 mL/hr at 04/28/18 5916  .  ceFAZolin (ANCEF) IV Stopped (04/28/18 0433)     LOS: 19 days    Time spent: 30 minutes    Morse Brueggemann Darleen Crocker, DO [facility] Pager [phone]  If 7PM-7AM, please contact night-coverage [email or web address] Password [facility] 04/28/2018, 6:56 AM

## 2018-04-28 NOTE — Progress Notes (Addendum)
Progress Note  Patient Name: Elizabeth [name] Date of Encounter: 04/28/2018  Primary Cardiologist: Skeet Latch, MD   Subjective   SOB post cath. No CP.   Inpatient Medications    Scheduled Meds: . aspirin EC  81 mg Oral Daily  . Chlorhexidine Gluconate Cloth  6 each Topical Daily  . heparin injection (subcutaneous)  5,000 Units Subcutaneous Q8H  . pantoprazole  40 mg Oral Daily  . sodium chloride flush  10-40 mL Intracatheter Q12H   Continuous Infusions: . sodium chloride Stopped (04/23/18 1444)  . sodium chloride 100 mL/hr at 04/28/18 0745  .  ceFAZolin (ANCEF) IV Stopped (04/28/18 0433)   PRN Meds: acetaminophen (TYLENOL) oral liquid 160 mg/5 mL, ALPRAZolam, morphine injection   Vital Signs    Vitals:   04/28/18 1010 04/28/18 1015 04/28/18 1020 04/28/18 1114  BP: (!) 97/37 (!) 97/37 (!) 89/36   Pulse: 90 (!) 50 66   Resp: (!) 21 (!) 22 20   Temp:    97.8 F (36.6 C)  TempSrc:    Axillary  SpO2: 93% 94% 94%   Weight:      Height:        Intake/Output Summary (Last 24 hours) at 04/28/2018 1144 Last data filed at 04/28/2018 0900 Gross per 24 hour  Intake 640 ml  Output 701 ml  Net -61 ml   Filed Weights   04/26/18 0428 04/27/18 0500 04/28/18 0620  Weight: 114.8 kg 115.4 kg 114.8 kg    Telemetry    afib CVR 90s- Personally Reviewed  ECG    Atrial fib RBB - Personally Reviewed  Physical Exam   GEN: morbidly obese No acute distress.   Neck: No JVD Cardiac: afib, regular rate. Murmur noted Respiratory: Clear to auscultation bilaterally. GI: Soft, nontender, non-distended  MS: No edema; No deformity. Neuro:  Nonfocal  Psych: Normal affect   Labs    Chemistry Recent Labs  Lab 04/26/18 0430 04/27/18 0516 04/28/18 0355  NA 142 138 139  K 3.7 4.2 4.8  CL 97* 95* 95*  CO2 37* 34* 32  GLUCOSE 110* 109* 107*  BUN 33* 29* 34*  CREATININE 0.81 1.00 1.32*  CALCIUM 7.9* 8.0* 8.0*  PROT 5.8*  --   --   ALBUMIN 2.5*  --   --   AST 16  --    --   ALT 8  --   --   ALKPHOS 41  --   --   BILITOT 1.3*  --   --   GFRNONAA >60 58* 41*  GFRAA >60 >60 48*  ANIONGAP 8 9 12      Hematology Recent Labs  Lab 04/26/18 0430 04/27/18 0516 04/28/18 0355  WBC 10.6* 13.3* 15.8*  RBC 2.65* 2.63* 2.63*  HGB 7.9* 7.9* 8.1*  HCT 26.4* 26.7* 26.1*  MCV 99.6 101.5* 99.2  MCH 29.8 30.0 30.8  MCHC 29.9* 29.6* 31.0  RDW 17.0* 17.2* 17.1*  PLT 163 176 198    Cardiac EnzymesNo results for input(s): TROPONINI in the last 168 hours. No results for input(s): TROPIPOC in the last 168 hours.   BNPNo results for input(s): BNP, PROBNP in the last 168 hours.   DDimer No results for input(s): DDIMER in the last 168 hours.   Radiology    No results found.  Cardiac Studies     Patient Profile   67 y.o.femalewith past medical history of hypertension, hyperlipidemia, previous lung cancer status post lobectomyandmild aortic stenosiswas admitted on 12/8/2019with left hip pain,  fever and hypotensionand diagnosed with septic arthritis and aortic valve endocarditis. Her echocardiogram was performed on 12/11/2019with finding of aortic valve endocarditis, this was followed by TEE on April 17, 2018 that showed normal LVEF. There are large vegetations on at least 2 aortic valve leaflets with perforation of the left coronary cusp and severe eccentric aortic regurgitation. She also has severe mitral regurgitation without any evidence of vegetation on the mitral valve. The patient has been treated with antibiotics for aortic valve endocarditis and septic arthritis, organism that grew from synovial fluid from the left hip wasstaplugdunenesis (rare). The patient is also having significant anemia at this secondary to acute endocarditis.  The patient developed acute respiratory failure yesterday with 2-1 block and eventually complete AV block, the patient was intubated. She was extubated 12/25. She was in NSR but now back in afib again.    Assessment & Plan    1. Aortic valve endocarditis w/ Severe AI: -Source: Septic Arthritis of the Hip -Tentative plan for antibiotics x4 weeks - RHC today showed mild-moderate non-obstructive CAD w/ 50% proximal RCA lesions.  - CT surgical consult pending for AVR.   2. Arrhthymias: Recurrent SVT and symptomatic AV block: -Initial hospital course c/b persistent SVT requiring amiodarone, however Amiodarone was discontinued in the setting of severe bradycardia and 1st degree AV block which progressed to 2-1 AV block - this has resolved.  She is now back in atrial fibrillation w/ CVR in the 90s.  -Observe on Tele   3. Septic arthritis: -Per ID/Ortho/primary team -s/pI&D with washout 04/17/2017 -IV antibiotics per above  4. Volume overload: RHC today showed elevated biventricular pressures.  - Will give IV Lasix.   For questions or updates, please contact [place] Please consult [email or web address] for contact info under     Signed, Lyda Jester, PA-C  04/28/2018, 11:44 AM    The patient was seen, examined and discussed with Brittainy M. Rosita Fire, PA-C and I agree with the above.   67 y.o.femalewith past medical history of hypertension, hyperlipidemia, previous lung cancer status post lobectomyandmild aortic stenosiswas admitted on 12/8/2019with left hip pain, fever and hypotensionand diagnosed with septic arthritis and aortic valve endocarditis. Her echocardiogram was performed on 12/11/2019with finding of aortic valve endocarditis, this was followed by TEE on April 17, 2018 that showed normal LVEF. There are large vegetations on at least 2 aortic valve leaflets with perforation of the left coronary cusp and severe eccentric aortic regurgitation. She also has severe mitral regurgitation without any evidence of vegetation on the mitral valve. The patient has been treated with antibiotics for aortic valve endocarditis and septic arthritis, organism that grew  from synovial fluid from the left hip wasstaplugdunenesis (rare). The patient is also having significant anemia at this secondary to acute endocarditis.  The patient developed acute respiratory failure with intermittent bradycardia on 04/24/2018 requiring intubation and was ] extubated on 04/26/2018 after 6 L diuresis. She was transferred to [facility] yesterday and underwent right and left cardiac catheterization today that showed 50% of the ostial to proximal RCA lesion otherwise minimal irregularities. Her right-sided cath showed elevated LVEDP, elevated RVSP 62/16 mmHg, cardiac output is normal however PA sats are quite low at 45%, there is severe AI.  The patient develop atrial flutter after the cath.  The patient currently feels short of breath, she appears pale, she is tachypneic, she developed acute lower abdominal pain that has resolved after bowel movement.  Her blood pressure is soft and 90s, she was in atrial flutter  with variable block and controlled ventricular rate earlier, currently in what appears to be accelerated junctional rhythm with ventricular rate of 90 bpm.  Plan: -Start IV Lasix 40 mg every 6 hours x3 doses then reevaluate, follow creatinine closely -Start Levophed drip, obtain Coox, she has a PICC line, possibly start milrinone later today -Transfer to CCU -Call PCM to continue to follow, -CT surgery consult pending - start IV heparin for atrial flutter, follow Hb closely, she might require transfusion prior to surgery Hb 7.9-8.1  This is a critically il patient that requires transfer to CCU, initiation of pressors, possibly inotropes, total time spent with the patient 1 hour.  Ena Dawley, MD 04/28/2018

## 2018-04-28 NOTE — Progress Notes (Signed)
PT Cancellation Note  Patient Details Name: Elizabeth [name] MRN: [id number] DOB: [birth year]   Cancelled Treatment:    Reason Eval/Treat Not Completed: Patient at procedure or test/unavailable. Pt in cath lab.   Shary Decamp [facility] 04/28/2018, 10:09 AM [place] Pager [phone] Office [phone]

## 2018-04-28 NOTE — Progress Notes (Addendum)
NAME:  Elizabeth [name], MRN:  [id number], DOB:  [birth year], LOS: 60 ADMISSION DATE:  04/09/2018, CONSULTATION DATE:  12/23 REFERRING MD:  Tyrell Antonio , CHIEF COMPLAINT:  Symptomatic bradycardia.    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  Had been treated with IV antibiotics, I&D of left hip and supportive care.  Critical care consulted on 12/23 emergently with symptomatic bradycardia, chest pain, and shortness of breath.  Past Medical History  Hypertension, hyperlipidemia, lung cancer status post lobectomy, mild aortic stenosis.   Significant Hospital Events   12/8 admitted abx started 12/16 I&D left hip; to [facility] 12/23 acute resp distress, symptomatic bradycardia; intubated for resp distress and chest pain  12/24 looks better. Working on weaning  12/25 weaning very well, will consider extubation today 12/26: stable to cone  12/27:-Right and left heart cath showing mild to moderate nonobstructive coronary artery disease involving 50% of the proximal RCA, elevated biventricular pressures, low cardiac output, severe aortic insufficiency.  Towards the end of the case developed symptomatic bradycardia once again, complicated by worsening shortness of breath and hypoxia received IV Lasix supplemental oxygen and transferred to ICU placed on Neo-Synephrine for cardiogenic shock Consults:  Cardiology  PCCM   Procedures:  OETT 12/23>>>12/25 Right PICC 12/12>>>  Significant Diagnostic Tests:   Micro Data:  Surgical culture 12/16>>no orgs BCX2 12/10: neg Left hip synovial 12/9:  staph Lugdunensis BCX2 12/8: staph Lugdunensis  Antimicrobials:  Ancef 12/9>>> vanc 12/8 to 12/9 Flagyl 12/8 to 12/9  Interim history/subjective:  Less short of breath currently, developed bradycardia worsening chest pain and shortness of breath towards the end of cardiac catheterization  case Objective   Blood pressure (Abnormal) 99/44, pulse 93, temperature 97.8 F (36.6 C), temperature source Axillary, resp. rate (Abnormal) 24, height 5\' 4"  (1.626 m), weight 114.8 kg, SpO2 98 %.        Intake/Output Summary (Last 24 hours) at 04/28/2018 1350 Last data filed at 04/28/2018 1312 Gross per 24 hour  Intake 700 ml  Output 801 ml  Net -101 ml   Filed Weights   04/26/18 0428 04/27/18 0500 04/28/18 0620  Weight: 114.8 kg 115.4 kg 114.8 kg   Examination: General: Pleasant 67 year old female she is resting comfortably in bed now she did have abdominal discomfort this is subsided HEENT normocephalic atraumatic no jugular venous distention Pulmonary: Diminished, crackles bases, no accessory use somewhat tachypneic. Cardiac: Regular irregular no murmur rub or gallop Abdomen: Soft nontender no organomegaly Extremities: Slightly cool, clammy, strong pulses. GU: Clear yellow Neuro: Intact  Resolved Hospital Problem list   Thrombocytopenia Symptomatic bradycardia Assessment & Plan:   Severe aortic insufficiency with aortic valve perforation in setting of aortic valve endocarditis secondary to septic arthritis. staph Lugdunensis growing in blood and left hip -Right and left heart cath showing mild to moderate nonobstructive coronary artery disease involving 50% of the proximal RCA, elevated biventricular pressures, low cardiac output, severe aortic insufficiency Plan Complete 4 weeks total abx (ancef) Needs valve replacement   Acute respiratory distress w/ associated chest pain 2/2 Acute pulmonary edema She was initially 12 liters positive. Now 1.7 Plan CPAP as needed and at HS Wean oxygen  CXR now  Scheduled Lasix  Acute Diastolic HF now w/ mild cardiogenic shock Tachy/brady w/ Bradycardia during cardiac cath then  PAF (recurrent again on 12/27) Had been treated for supraventricular tachycardia with amiodarone. Now off rate control meds Plan Cont tele  Cont  scheduled lasix (w/ aortic disease will need to be mindful to not over shoot her preload reduction) Repeat co-ox now (suspect that this was more hypoxia driven) Neo for MAP >22. Given her problems w/ rate control worried that norepi will precipitate tachycardia which would be poorly tolerated w/ AI  IV heparin  Mild AKI -Has been aggressively diuresed Plan Continuing Lasix for now Repeating chemistry a.m., may need to reevaluate diuretic regimen  Anemia of critical Illness w/ FESO4 def Drifted down from 8/6 to 7.9 no clear source of bleeding; still holding 8.1 Plan Trend cbc  intermittent fluid and electrolyte imbalance: Iatrogenic metabolic alkalosis, intermittent hypokalemia Plan Serial chemistries  Replace as needed.   Mild leukocytosis Plan Trend fever and WBC curve   Best practice:  Diet: heart healthy 12/26 Pain/Anxiety/Delirium protocol d/c'd 12/25 VAP protocol (if indicated): stopped 12/25 DVT prophylaxis: [place] heparin resumed. Consider adding back IV heparin 12/23 if hgb stable  GI prophylaxis: PPI Glucose control: ssi Mobility: Out of bed Code Status: full code  Family Communication: update  Disposition is critically ill due to life-threatening cardiogenic shock, and recurrent pulmonary edema, seems to be improving with volume reduction will need vaso-pressor support with titration to ensure mean arterial pressure greater than [place] ACNP-BC [place] Pager # [phone] OR # [phone] if no answer  Attending Note:  67 year old female with PMH of aortic valve endocarditis who developed flash pulmonary edema and was intubated to diurese and then extubate.  Patient was sent to [facility] for cath and today post cath developed bradycardia, hypotensive and hypoxemic respiratory failure.  PCCM was reconsulted.  On exam, patient is alert and interactive, moving all ext to command with coarse BS diffusely.  I reviewed CXR myself, pulmonary edema  noted.  Discussed with PCCM-NP.  Will admit to the ICU.  Start neo for BP support.  Hold IVF.  BiPAP for respiratory and hemodynamic support.  PCCM will continue to follow.  The patient is critically ill with multiple organ systems failure and requires high complexity decision making for assessment and support, frequent evaluation and titration of therapies, application of advanced monitoring technologies and extensive interpretation of multiple databases.   Critical Care Time devoted to patient care services described in this note is  36  Minutes. This time reflects time of care of this signee Dr Jennet Maduro. This critical care time does not reflect procedure time, or teaching time or supervisory time of PA/NP/Med student/Med Resident etc but could involve care discussion time.  Rush Farmer, M.D. [facility] Pulmonary/Critical Care Medicine. Pager: [phone]. After hours pager: [phone].

## 2018-04-28 NOTE — Progress Notes (Signed)
Patient is being transferred to [facility] per provider orders. Called and gave report to [facility].

## 2018-04-28 NOTE — Progress Notes (Addendum)
ANTICOAGULATION CONSULT NOTE - Initial Consult  Pharmacy Consult for Heparin Indication: atrial fibrillation  Allergies  Allergen Reactions  . Penicillins Other (See Comments)    Fever -when she was 67 yrs old Has patient had a PCN reaction causing immediate rash, facial/tongue/throat swelling, SOB or lightheadedness with hypotension: NO Has patient had a PCN reaction causing severe rash involving mucus membranes or skin necrosis: Unknown Has patient had a PCN reaction that required hospitalization: Unknown Has patient had a PCN reaction occurring within the last 10 years: No Childhood allergy If all of the above answers are "NO", then may proceed with Cephalosporin use.     Patient Measurements: Height: 5\' 4"  (162.6 cm) Weight: 253 lb 1.4 oz (114.8 kg)(bed doesn't appear to have been zeroed prior to pt) IBW/kg (Calculated) : 54.7 Heparin Dosing Weight: 85.2  Vital Signs: Temp: 97.8 F (36.6 C) (12/27 1114) Temp Source: Axillary (12/27 1114) BP: 103/47 (12/27 1300) Pulse Rate: 94 (12/27 1300)  Labs: Recent Labs    04/26/18 0430 04/27/18 0516 04/28/18 0355  HGB 7.9* 7.9* 8.1*  HCT 26.4* 26.7* 26.1*  PLT 163 176 198  CREATININE 0.81 1.00 1.32*    Estimated Creatinine Clearance: 50.7 mL/min (A) (by C-G formula based on SCr of 1.32 mg/dL (H)).   Medical History: Past Medical History:  Diagnosis Date  . Aortic stenosis, mild 01/08/2015  . Dyspnea    with rest   . Dysrhythmia   . Hx of cancer of lung 6/10   Removed June 2010  . Hyperlipidemia   . Hypertension   . Low back pain   . Morbid obesity ([place]) 01/08/2015  . Right foot injury    report she sustained a fracture to her right , present with ortho boot on affected extremity   . Vitamin D deficiency     Assessment: 67 yo female with aortic valve endocarditis that has been being treated at [facility] since 12/8.  Endocarditis thought to be secondary to septic joint which was debrided on 12/16.  Patient  transferred to [facility] on 12/27 for cardiac cath and possible surgical intervention for aortic valve endocarditis.  Patient developed atrial flutter after cath.  Pharmacy was consulted to start heparin 8 hours after the sheath removal.  Sheath is scheduled to be removed at 1400 today; therefore we will begin heparin drip at 2200 tonight.  Goal of Therapy:  Heparin level 0.3-0.7 units/ml Monitor platelets by anticoagulation protocol: Yes   Plan:  Start heparin infusion at 1250 units/hr Check anti-Xa level in 6 hours and daily while on heparin Continue to monitor H&H and platelets  Alanda Slim, PharmD, [facility] Clinical Pharmacist Please see AMION for all Pharmacists' Contact Phone Numbers 04/28/2018, 1:20 PM

## 2018-04-28 NOTE — Progress Notes (Signed)
[facility] for Infectious Disease    Date of Admission:  04/09/2018   Total days of antibiotics 19 cefazolin           ID: Elizabeth [name] is a 67 y.o. female with staph ludg left hip septic arthritis s/p I x D - complicated by native AV endocarditis with  1st  decompensated heart failure and  CHB with asystole s/p resuscitation. 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 Decompensated on 12/23 with symptomatic bradycardia/asystole 2/2 CHB requiring intubation for resp distress and chest pain Also, she also was febrile with increased leukocytosis from 12/23-12/25. 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  She continues to be on iv diuretics and neosynephrine for management of cardiogenic shock. Currently denies chest pain, denies hip pain  Medications:  . aspirin EC  81 mg Oral Daily  . Chlorhexidine Gluconate Cloth  6 each Topical Daily  . furosemide  40 mg Intravenous Q6H  . pantoprazole  40 mg Oral Daily  . sodium chloride flush  10-40 mL Intracatheter Q12H  . sodium chloride flush  3 mL Intravenous Q12H    Objective: Vital signs in last 24 hours: Temp:  [97.6 F (36.4 C)-98 F (36.7  C)] 97.9 F (36.6 C) (12/27 1552) Pulse Rate:  [50-101] 88 (12/27 1615) Resp:  [11-49] 36 (12/27 1615) BP: (89-126)/(30-92) 109/43 (12/27 1615) SpO2:  [91 %-100 %] 98 % (12/27 1615) Weight:  [114.8 kg] 114.8 kg (12/27 3154) Physical Exam  Constitutional:  oriented to person, place, and time. appears well-developed and well-nourished. No distress.  HENT: [place]/AT, PERRLA, no scleral icterus Mouth/Throat: Oropharynx is clear and moist. No oropharyngeal exudate.  Cardiovascular: irreg reg +murmur Pulmonary/Chest: Effort normal and breath sounds normal. No respiratory distress.  has no wheezes.  Neck = supple, no nuchal rigidity Abdominal: Soft. Bowel sounds are normal.  exhibits no distension. There is no tenderness.  Lymphadenopathy: no cervical adenopathy. No axillary adenopathy Neurological: alert and oriented to person, place, and time.  Skin: Skin is warm and dry.scattered echymosis Ext: anasarca pitting edema Psychiatric: a normal mood and affect.  behavior is normal.   Lab Results Recent Labs    04/27/18 0516 04/28/18 0355  WBC 13.3* 15.8*  HGB 7.9* 8.1*  HCT 26.7* 26.1*  NA 138 139  K 4.2 4.8  CL 95* 95*  CO2 34* 32  BUN 29* 34*  CREATININE 1.00 1.32*   Liver Panel Recent Labs    04/26/18 0430  PROT 5.8*  ALBUMIN 2.5*  AST 16  ALT 8  ALKPHOS 41  BILITOT 1.3*    Microbiology: 12/16 synovial fluid cx ngtd 12/10 blood cx  ngtd 12/9 synovial fluid staph ludg 12/8 blood cx staph ludg Studies/Results: Dg Abd 1 View  Result Date: 04/28/2018 CLINICAL DATA:  Acute onset of LOWER abdominal pain and pelvic pain which improved after a bowel movement. EXAM: ABDOMEN - 1 VIEW COMPARISON:  04/24/2018. FINDINGS: Near complete resolution of the previously identified small bowel ileus since the examination 4 days ago. Only 2 or 3 residual mildly distended loops of small bowel persist in the LEFT UPPER QUADRANT. Gas and stool throughout decompressed colon. No suggestion of free  air on the supine image. Surgical clips in the RIGHT UPPER QUADRANT from prior cholecystectomy. Degenerative changes involving the lumbar spine and both hips. IMPRESSION: Near complete resolution of small bowel ileus since the examination 4 days ago. Electronically Signed   By: Evangeline Dakin M.D.   On: 04/28/2018 13:42   Dg Chest Port 1 View  Result Date: 04/28/2018 CLINICAL DATA:  History of pulmonary edema, follow-up EXAM: PORTABLE CHEST 1 VIEW COMPARISON:  Portable chest x-ray of 04/26/2017 FINDINGS: The endotracheal tube has been removed. Aeration has improved somewhat. However there are patchy areas of airspace disease bilaterally most consistent with either multifocal pneumonia or residual edema. There may be a small right effusion present. Mild cardiomegaly is stable. No bony abnormality is seen. IMPRESSION: 1. Somewhat better aeration, but there are still patchy areas of airspace disease bilaterally. These may represent multifocal pneumonia or less likely residual edema. 2. Cardiomegaly and small right pleural effusion. Electronically Signed   By: Ivar Drape M.D.   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 Only completed nearly 17 day of abtx prior to having heart valve replacement  - to undergo heart valve replacement tomorrow, please send tissue for aerobic/anaerobic culture  - given her recent fever, would plan to restart abtx clock as of 12/28 being day 1 and restart treatment for AV endocarditis - will follow up post surgery   [facility] for Infectious Diseases Cell: [phone] Pager: [phone]  04/28/2018, 5:06 PM

## 2018-04-28 NOTE — Significant Event (Signed)
Rapid Response Event Note  Overview: Time Called: 3845 Arrival Time: 1110 Event Type: Cardiac, Respiratory  Initial Focused Assessment: Pt lying supine in bed, skin pale, cool and clammy, lower abd pain, 100% on NRB. Dr. Manuella Ghazi at bedside. [place] PA and Dr. Meda Coffee with cardiology to bedside. Transferred to 2h   Interventions: EKG Transfer to 2h    Event Summary: Name of Physician Notified: Dr. Manuella Ghazi  at (PTA RRT )  Name of Consulting Physician Notified: Dr. Meda Coffee at 1110  Outcome: Transferred (Comment)(2h)     Gevena Mart, Sela Hua

## 2018-04-28 NOTE — Interval H&P Note (Signed)
History and Physical Interval Note:  04/28/2018 9:22 AM  Elizabeth [name]  has presented today for surgery, with the diagnosis of Aortic indocardisis  The various methods of treatment have been discussed with the patient and family. After consideration of risks, benefits and other options for treatment, the patient has consented to  Procedure(s): RIGHT/LEFT HEART CATH AND CORONARY ANGIOGRAPHY (N/A) as a surgical intervention .  The patient's history has been reviewed, patient examined, no change in status, stable for surgery.  I have reviewed the patient's chart and labs.  Questions were answered to the patient's satisfaction.     [name]

## 2018-04-28 NOTE — Progress Notes (Signed)
PT Cancellation Note  Patient Details Name: Elizabeth [name] MRN: [id number] DOB: 11/11/1950   Cancelled Treatment:    Reason Eval/Treat Not Completed: Patient not medically ready. Pt with cardiac issues requiring transfer to ICU.   [place] 04/28/2018, 1:15 PM [place] Pager [phone] Office [phone]

## 2018-04-28 NOTE — Progress Notes (Signed)
Patient arrived from cath lab. Procedure Rt. radial Lft. Brachial. She is complaining of being hot and she is clammy. Patient states she is having a lot of pain in her abdominal area 8 on scale from 0-10. Pt. Placed on bedpan and she had a bowel movement.  B/p 113/55, RR 39, O2 100, HR 95, Temp 98.7  EKG 1113- AFIB rt. BBB EKG 1132-Wide QRS rt BBB Blood Sugar 127 Placed Non Re-breather on patient as she was short of breath and RR 49 Called Rapid Response Called Dr. Manuella Ghazi and asked him to come see patient. Called PA Rosita Fire- She and Dr. Meda Coffee came to see patient. Dr. Meda Coffee would like to transfer patient to [facility]

## 2018-04-28 NOTE — Consult Note (Signed)
[address]       [place] [address]             [phone]      Cardiothoracic Surgery Consultation  Reason for Consult: aortic valve endocarditis with severe AI, severe MR Referring Physician: Dr. Ena Dawley  Elizabeth [name] is an 67 y.o. female.  HPI:   The patient is a 67 year old woman with a history of mild aortic stenosis by echocardiogram in 2016, hypertension, hyperlipidemia, morbid obesity, a history of lung cancer status post lower lobectomy by Dr. Arlyce Dice in June 2010, degenerative joint disease status post right total knee replacement in August 2019 who said that she had her left hip injected with steroids at [facility] radiology at [facility] around the middle of November 2019.  She had this performed so that she would be able to go on a cruise with her family.  She said that within a couple days her hip was hurting her a lot but she went on a cruise anyway and had a miserable time and barely made it back home.  She then developed a fever and felt poorly about a week prior to presentation on 04/09/2018 with a temperature of 105 with hypotension.  She had positive blood cultures for Staphylococcus Lugdenensis.  An echocardiogram showed a calcified aortic valve with possible vegetation.  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  She was taken the operating room on 04/17/2018 and underwent irrigation and debridement of her septic left hip and cultures grew out the same bacteria.  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  The mean gradient across aortic valve was 32 mmHg suggesting moderate aortic stenosis.  Left ventricular ejection fraction was 55 to 60%.  She was treated with antibiotics under the direction of infectious disease and cared for by the hospitalist and cardiologist at [facility].  On 1223  she developed acute symptomatic bradycardia with worsening shortness of breath and chest pain requiring intubation.  She improved with diuresis and was extubated.  She was transferred to [facility] yesterday for catheterization today.  Cardiac catheterization shows an ostial to proximal 50% right coronary stenosis.  Right heart catheterization showed PA pressure of 61/28 with a mean of 38.  Pulmonary capillary wedge pressure was 22.  Cardiac index was 2.5.  PA saturation was 43% and 47% with a arterial saturation 94%.  She developed worsening shortness of breath and hypoxemia after catheterization today.  She was started on Neo-Synephrine for hypotension.  She was given intravenous Lasix with improvement.  Past Medical History:  Diagnosis Date  . Aortic stenosis, mild 01/08/2015  . Dyspnea    with rest   . Dysrhythmia   . Hx of cancer of lung 6/10   Removed June 2010  . Hyperlipidemia   . Hypertension   . Low back pain   . Morbid obesity ([place]) 01/08/2015  . Right foot injury    report she sustained a fracture to her right , present with ortho boot on affected extremity   . Vitamin D deficiency     Past Surgical History:  Procedure Laterality Date  . ANKLE FUSION Left   . CHOLECYSTECTOMY    . INCISION AND DRAINAGE HIP Left 04/17/2018   Procedure: IRRIGATION AND DEBRIDEMENT LEFT HIP (ANTERIOR);  Surgeon: Gaynelle Arabian, MD;  Location: [facility];  Service: Orthopedics;  Laterality: Left;  . LUNG REMOVAL, PARTIAL    . RIGHT/LEFT HEART CATH AND CORONARY ANGIOGRAPHY N/A 04/28/2018   Procedure: RIGHT/LEFT HEART CATH AND CORONARY ANGIOGRAPHY;  Surgeon: Jolaine Artist, MD;  Location: [facility];  Service: Cardiovascular;  Laterality: N/A;  . TEE WITHOUT CARDIOVERSION     04/17/2018  . TEE WITHOUT CARDIOVERSION N/A 04/17/2018   Procedure: TRANSESOPHAGEAL ECHOCARDIOGRAM (TEE);  Surgeon: Larey Dresser, MD;  Location: [facility] ENDOSCOPY;  Service: Cardiovascular;  Laterality: N/A;  . TOTAL KNEE  ARTHROPLASTY Right 12/12/2017   Procedure: RIGHT TOTAL KNEE ARTHROPLASTY;  Surgeon: Gaynelle Arabian, MD;  Location: [facility];  Service: Orthopedics;  Laterality: Right;  . TUBAL LIGATION      Family History  Problem Relation Age of Onset  . Hypertension Mother   . Stroke Mother   . Cancer Mother        lung  . Heart disease Father        cabg  . Cancer Brother        skin    Social History:  reports that she quit smoking about 13 years ago. Her smoking use included cigarettes. She has never used smokeless tobacco. She reports that she does not drink alcohol or use drugs.  Allergies:  Allergies  Allergen Reactions  . Penicillins Other (See Comments)    Fever -when she was 67 yrs old Has patient had a PCN reaction causing immediate rash, facial/tongue/throat swelling, SOB or lightheadedness with hypotension: NO Has patient had a PCN reaction causing severe rash involving mucus membranes or skin necrosis: Unknown Has patient had a PCN reaction that required hospitalization: Unknown Has patient had a PCN reaction occurring within the last 10 years: No Childhood allergy If all of the above answers are "NO", then may proceed with Cephalosporin use.     Medications:  I have reviewed the patient's current medications. Prior to Admission:  Medications Prior to Admission  Medication Sig Dispense Refill Last Dose  . Cholecalciferol (DIALYVITE VITAMIN D 5000) 125 MCG (5000 UT) capsule Take 5,000 Units by mouth daily.   04/08/2018 at Unknown time  . Homeopathic Products (LEG CRAMPS) TABS Take 3 tablets by mouth daily as needed (cramping).   04/09/2018 at Unknown time  . HYDROcodone-acetaminophen (NORCO) 5-325 MG tablet Take 1 tablet by mouth every 4 (four) hours as needed (for pain). 20 tablet 0 Past Week at Unknown time  . Multiple Vitamins-Minerals (HAIR SKIN AND NAILS FORMULA) TABS Take 2 tablets by mouth daily.   unknown  . olmesartan-hydrochlorothiazide (BENICAR HCT) 20-12.5 MG tablet Take  1 tablet by mouth daily. 30 tablet 3 04/08/2018 at Unknown time  . oxyCODONE (OXY IR/ROXICODONE) 5 MG immediate release tablet Take 7.5 mg by mouth every 6 (six) hours as needed for moderate pain or severe pain.    04/09/2018 at Unknown time  . predniSONE (DELTASONE) 10 MG tablet Take 10 mg by mouth as directed. Take 6 tablet for 2 Days Take 5 tablet for 2 Days Take 4 tablets for 2 Days Take 3 tablets for 2 Days Take 2 tablets for 2 Days Take 1 tablet for 2 Days   04/08/2018 at Unknown time   Scheduled: . aspirin EC  81 mg Oral Daily  . Chlorhexidine Gluconate Cloth  6 each Topical Daily  . furosemide  40 mg Intravenous Q6H  . pantoprazole  40 mg Oral Daily  . sodium chloride flush  10-40 mL Intracatheter Q12H  . sodium chloride flush  3 mL Intravenous Q12H   Continuous: . sodium chloride Stopped (04/23/18 1444)  . sodium chloride 5 mL/hr at 04/28/18 1700  . sodium chloride 10 mL/hr at 04/28/18 1411  .  ceFAZolin (ANCEF) IV 2 g (04/28/18 1309)  . heparin    . norepinephrine (LEVOPHED) Adult infusion Stopped (04/28/18 1312)  . phenylephrine (NEO-SYNEPHRINE) Adult infusion 80 mcg/min (04/28/18 1700)   VOH:YWVPXT chloride, acetaminophen, ALPRAZolam, morphine injection, ondansetron (ZOFRAN) IV, sodium chloride flush Anti-infectives (From admission, onward)   Start     Dose/Rate Route Frequency Ordered Stop   04/11/18 1000  vancomycin (VANCOCIN) IVPB 1000 mg/200 mL premix  Status:  Discontinued     1,000 mg 200 mL/hr over 60 Minutes Intravenous Every 36 hours 04/09/18 2107 04/10/18 1532   04/11/18 1000  vancomycin (VANCOCIN) 1,250 mg in sodium chloride 0.9 % 250 mL IVPB  Status:  Discontinued     1,250 mg 166.7 mL/hr over 90 Minutes Intravenous Every 36 hours 04/10/18 1532 04/11/18 1143   04/10/18 1600  ceFAZolin (ANCEF) IVPB 2g/100 mL premix     2 g 200 mL/hr over 30 Minutes Intravenous Every 8 hours 04/10/18 1545     04/10/18 0600  ceFEPIme (MAXIPIME) 2 g in sodium chloride 0.9 % 100  mL IVPB  Status:  Discontinued     2 g 200 mL/hr over 30 Minutes Intravenous Every 12 hours 04/09/18 2107 04/10/18 1144   04/09/18 2200  vancomycin (VANCOCIN) 500 mg in sodium chloride 0.9 % 100 mL IVPB     500 mg 100 mL/hr over 60 Minutes Intravenous  Once 04/09/18 2107 04/09/18 2349   04/09/18 1800  ceFEPIme (MAXIPIME) 2 g in sodium chloride 0.9 % 100 mL IVPB     2 g 200 mL/hr over 30 Minutes Intravenous  Once 04/09/18 1752 04/09/18 1841   04/09/18 1800  metroNIDAZOLE (FLAGYL) IVPB 500 mg  Status:  Discontinued     500 mg 100 mL/hr over 60 Minutes Intravenous Every 8 hours 04/09/18 1752 04/10/18 1537   04/09/18 1800  vancomycin (VANCOCIN) IVPB 1000 mg/200 mL premix     1,000 mg 200 mL/hr over 60 Minutes Intravenous  Once 04/09/18 1752 04/09/18 2156      Results for orders placed or performed during the hospital encounter of 04/09/18 (from the past 48 hour(s))  Glucose, capillary     Status: Abnormal   Collection Time: 04/26/18  7:52 PM  Result Value Ref Range   Glucose-Capillary 116 (H) 70 - 99 mg/dL   Comment 1 Notify RN    Comment 2 Document in Chart   Glucose, capillary     Status: Abnormal   Collection Time: 04/26/18 11:31 PM  Result Value Ref Range   Glucose-Capillary 106 (H) 70 - 99 mg/dL   Comment 1 Notify RN    Comment 2 Document in Chart   Glucose, capillary     Status: Abnormal   Collection Time: 04/27/18  3:39 AM  Result Value Ref Range   Glucose-Capillary 110 (H) 70 - 99 mg/dL   Comment 1 Notify RN    Comment 2 Document in Chart   CBC     Status: Abnormal   Collection Time: 04/27/18  5:16 AM  Result Value Ref Range   WBC 13.3 (H) 4.0 - 10.5 K/uL   RBC 2.63 (L) 3.87 - 5.11 MIL/uL   Hemoglobin 7.9 (L) 12.0 - 15.0 g/dL   HCT 26.7 (L) 36.0 - 46.0 %   MCV 101.5 (H) 80.0 - 100.0 fL  MCH 30.0 26.0 - 34.0 pg   MCHC 29.6 (L) 30.0 - 36.0 g/dL   RDW 17.2 (H) 11.5 - 15.5 %   Platelets 176 150 - 400 K/uL   nRBC 0.0 0.0 - 0.2 %    Comment: Performed at [facility], [place] [address], [place], [place] [address]  Basic metabolic panel     Status: Abnormal   Collection Time: 04/27/18  5:16 AM  Result Value Ref Range   Sodium 138 135 - 145 mmol/L   Potassium 4.2 3.5 - 5.1 mmol/L   Chloride 95 (L) 98 - 111 mmol/L   CO2 34 (H) 22 - 32 mmol/L   Glucose, Bld 109 (H) 70 - 99 mg/dL   BUN 29 (H) 8 - 23 mg/dL   Creatinine, Ser 1.00 0.44 - 1.00 mg/dL   Calcium 8.0 (L) 8.9 - 10.3 mg/dL   GFR calc non Af Amer 58 (L) >60 mL/min   GFR calc Af Amer >60 >60 mL/min   Anion gap 9 5 - 15    Comment: Performed at [facility], [place] [address] [place], [place] [address]  Magnesium     Status: None   Collection Time: 04/27/18  5:16 AM  Result Value Ref Range   Magnesium 2.3 1.7 - 2.4 mg/dL    Comment: Performed at [facility], [place] [address] [place], [place] [address]  Phosphorus     Status: None   Collection Time: 04/27/18  5:16 AM  Result Value Ref Range   Phosphorus 3.7 2.5 - 4.6 mg/dL    Comment: Performed at [facility], [place] [address], [place], [place] [address]  Glucose, capillary     Status: Abnormal   Collection Time: 04/27/18  8:04 AM  Result Value Ref Range   Glucose-Capillary 105 (H) 70 - 99 mg/dL   Comment 1 Notify RN    Comment 2 Document in Chart   Glucose, capillary     Status: Abnormal   Collection Time: 04/27/18 11:50 AM  Result Value Ref Range   Glucose-Capillary 115 (H) 70 - 99 mg/dL   Comment 1 Notify RN    Comment 2 Document in Chart   Basic metabolic panel     Status: Abnormal   Collection Time: 04/28/18  3:55 AM  Result Value Ref Range   Sodium 139 135 - 145 mmol/L   Potassium 4.8 3.5 - 5.1 mmol/L   Chloride 95 (L) 98 - 111 mmol/L   CO2 32 22 - 32 mmol/L   Glucose, Bld 107 (H) 70 - 99 mg/dL   BUN 34 (H) 8 - 23 mg/dL   Creatinine, Ser 1.32 (H) 0.44 - 1.00 mg/dL   Calcium 8.0 (L) 8.9 - 10.3 mg/dL   GFR calc non Af Amer 41 (L) >60 mL/min   GFR calc Af  Amer 48 (L) >60 mL/min   Anion gap 12 5 - 15    Comment: Performed at [place] [address], [place], [place] [address]  CBC     Status: Abnormal   Collection Time: 04/28/18  3:55 AM  Result Value Ref Range   WBC 15.8 (H) 4.0 - 10.5 K/uL   RBC 2.63 (L) 3.87 - 5.11 MIL/uL   Hemoglobin 8.1 (L) 12.0 - 15.0 g/dL   HCT 26.1 (L) 36.0 - 46.0 %   MCV 99.2 80.0 - 100.0 fL   MCH 30.8 26.0 - 34.0 pg   MCHC 31.0 30.0 -  36.0 g/dL   RDW 17.1 (H) 11.5 - 15.5 %   Platelets 198 150 - 400 K/uL   nRBC 0.0 0.0 - 0.2 %    Comment: Performed at [facility], [place] [address] [place], [place] [address]  I-STAT 3, arterial blood gas (G3+)     Status: Abnormal   Collection Time: 04/28/18 10:00 AM  Result Value Ref Range   pH, Arterial 7.513 (H) 7.350 - 7.450   pCO2 arterial 39.4 32.0 - 48.0 mmHg   pO2, Arterial 66.0 (L) 83.0 - 108.0 mmHg   Bicarbonate 31.6 (H) 20.0 - 28.0 mmol/L   TCO2 33 (H) 22 - 32 mmol/L   O2 Saturation 94.0 %   Acid-Base Excess 8.0 (H) 0.0 - 2.0 mmol/L   Patient temperature HIDE    Sample type ARTERIAL   I-STAT 3, venous blood gas (G3P V)     Status: Abnormal   Collection Time: 04/28/18 10:06 AM  Result Value Ref Range   pH, Ven 7.486 (H) 7.250 - 7.430   pCO2, Ven 44.9 44.0 - 60.0 mmHg   pO2, Ven 22.0 (LL) 32.0 - 45.0 mmHg   Bicarbonate 33.9 (H) 20.0 - 28.0 mmol/L   TCO2 35 (H) 22 - 32 mmol/L   O2 Saturation 42.0 %   Acid-Base Excess 10.0 (H) 0.0 - 2.0 mmol/L   Patient temperature HIDE    Sample type VENOUS    Comment NOTIFIED PHYSICIAN   I-STAT 3, venous blood gas (G3P V)     Status: Abnormal   Collection Time: 04/28/18 10:17 AM  Result Value Ref Range   pH, Ven 7.482 (H) 7.250 - 7.430   pCO2, Ven 44.4 44.0 - 60.0 mmHg   pO2, Ven 24.0 (LL) 32.0 - 45.0 mmHg   Bicarbonate 33.2 (H) 20.0 - 28.0 mmol/L   TCO2 35 (H) 22 - 32 mmol/L   O2 Saturation 47.0 %   Acid-Base Excess 9.0 (H) 0.0 - 2.0 mmol/L   Patient temperature HIDE    Sample type VENOUS     Comment NOTIFIED PHYSICIAN   I-STAT 3, venous blood gas (G3P V)     Status: Abnormal   Collection Time: 04/28/18 10:17 AM  Result Value Ref Range   pH, Ven 7.482 (H) 7.250 - 7.430   pCO2, Ven 45.3 44.0 - 60.0 mmHg   pO2, Ven 23.0 (LL) 32.0 - 45.0 mmHg   Bicarbonate 33.9 (H) 20.0 - 28.0 mmol/L   TCO2 35 (H) 22 - 32 mmol/L   O2 Saturation 43.0 %   Acid-Base Excess 9.0 (H) 0.0 - 2.0 mmol/L   Patient temperature HIDE    Sample type VENOUS    Comment NOTIFIED PHYSICIAN   Glucose, capillary     Status: Abnormal   Collection Time: 04/28/18 11:08 AM  Result Value Ref Range   Glucose-Capillary 127 (H) 70 - 99 mg/dL  .Cooxemetry Panel (carboxy, met, total hgb, O2 sat)     Status: Abnormal   Collection Time: 04/28/18  1:00 PM  Result Value Ref Range   Total hemoglobin 8.6 (L) 12.0 - 16.0 g/dL   O2 Saturation 64.7 %   Carboxyhemoglobin 2.1 (H) 0.5 - 1.5 %   Methemoglobin 1.6 (H) 0.0 - 1.5 %  APTT     Status: None   Collection Time: 04/28/18  1:30 PM  Result Value Ref Range   aPTT 34 24 - 36 seconds    Comment: Performed at [facility] [address] [place], [place] [address]  Dg Abd 1 View  Result Date: 04/28/2018 CLINICAL DATA:  Acute onset of LOWER abdominal pain and pelvic pain which improved after a bowel movement. EXAM: ABDOMEN - 1 VIEW COMPARISON:  04/24/2018. FINDINGS: Near complete resolution of the previously identified small bowel ileus since the examination 4 days ago. Only 2 or 3 residual mildly distended loops of small bowel persist in the LEFT UPPER QUADRANT. Gas and stool throughout decompressed colon. No suggestion of free air on the supine image. Surgical clips in the RIGHT UPPER QUADRANT from prior cholecystectomy. Degenerative changes involving the lumbar spine and both hips. IMPRESSION: Near complete resolution of small bowel ileus since the examination 4 days ago. Electronically Signed   By: Evangeline Dakin M.D.   On: 04/28/2018 13:42   Dg Chest Port 1  View  Result Date: 04/28/2018 CLINICAL DATA:  History of pulmonary edema, follow-up EXAM: PORTABLE CHEST 1 VIEW COMPARISON:  Portable chest x-ray of 04/26/2017 FINDINGS: The endotracheal tube has been removed. Aeration has improved somewhat. However there are patchy areas of airspace disease bilaterally most consistent with either multifocal pneumonia or residual edema. There may be a small right effusion present. Mild cardiomegaly is stable. No bony abnormality is seen. IMPRESSION: 1. Somewhat better aeration, but there are still patchy areas of airspace disease bilaterally. These may represent multifocal pneumonia or less likely residual edema. 2. Cardiomegaly and small right pleural effusion. Electronically Signed   By: Ivar Drape M.D.   On: 04/28/2018 15:34    Review of Systems  Constitutional: Positive for chills, fever and malaise/fatigue.  HENT:       Does not see her dentist regularly.  She did have a tooth pulled in August prior to her knee replacement.  She denies any pain in her teeth or jaw.  Eyes: Negative.   Respiratory: Positive for shortness of breath.   Cardiovascular: Positive for palpitations, orthopnea and leg swelling. Negative for chest pain.  Gastrointestinal: Positive for abdominal pain.  Genitourinary: Negative.   Musculoskeletal: Positive for joint pain.  Skin: Negative.   Neurological: Negative for dizziness, loss of consciousness and headaches.  Endo/Heme/Allergies: Negative.   Psychiatric/Behavioral: Negative.    Blood pressure (!) 109/43, pulse 88, temperature 97.9 F (36.6 C), temperature source Oral, resp. rate (!) 36, height '5\' 4"'  (1.626 m), weight 114.8 kg, SpO2 98 %. Physical Exam  Constitutional: She is oriented to person, place, and time.  Morbidly obese woman in no distress  HENT:  Head: Normocephalic and atraumatic.  Mouth/Throat: Oropharynx is clear and moist.  8 remaining teeth.  Eyes: Pupils are equal, round, and reactive to light. Conjunctivae  and EOM are normal.  Neck: Normal range of motion. Neck supple. No JVD present.  Cardiovascular: Normal rate and regular rhythm.  Murmur heard. 2/6 systolic murmur along the right sternal border, 2/6 diastolic murmur at the apex.  Respiratory: Effort normal. No respiratory distress. She has rales.  GI: Soft. Bowel sounds are normal. She exhibits no distension. There is no abdominal tenderness.  Musculoskeletal:        General: Edema present.  Lymphadenopathy:    She has no cervical adenopathy.  Neurological: She is alert and oriented to person, place, and time.  Skin: Skin is warm and dry.  Psychiatric: She has a normal mood and affect.   Physicians   Panel Physicians Referring Physician Case Authorizing Physician  Bensimhon, Shaune Pascal, MD (Primary)    Procedures   RIGHT/LEFT HEART CATH AND CORONARY ANGIOGRAPHY  Conclusion  Ost RCA to Prox RCA lesion is 50% stenosed.  Mid RCA lesion is 20% stenosed.   Findings:  Ao = 95/40 (63)  LV = 123/26 RA =  12 RV = 62/16 PA = 61/28 (38) PCW = 22 Fick cardiac output/index = 5.33/2.5 PVR = 3.0 WU Ao sat = 94% PA sat = 43%, 47%  Assessment: 1. 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 Elevated biventricular pressures 3. Cardiac output is normal however PA sat is quite low at 45% 4. 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 Patient developed atrial flutter in the 50s at the end of the case.  Plan/Discussion:  Surgical eval for AVR. Start heparin for AFL.   Glori Bickers, MD  10:28 AM     Surgeon Notes     04/23/2018 11:05 PM Operative Note signed by Gaynelle Arabian, MD  Indications   Nonrheumatic aortic valve insufficiency [I35.1 (ICD-10-CM)]  Endocarditis of aortic valve [I35.8 (ICD-10-CM)]  Procedural Details   Technical Details The risks and indication of the procedure were explained. Consent was signed and placed on the chart. An appropriate timeout was taken  prior to the procedure.   The left AC fossa was prepped and draped in the routine sterile fashion and anesthetized with 1% local lidocaine. Using a micropuncture kit and u/s guidance a 5 FR venous sheath was placed in the left brachial vein using a modified Seldinger technique. A standard Swan-Ganz catheter was used for the procedure.   After a normal Allen's test was confirmed, the right wrist was prepped and draped in the routine sterile fashion and anesthetized with 1% local lidocaine. A 5 FR arterial sheath was then placed in the right radial artery using a modified Seldinger technique. 69m IV verapamil was given through the sheath. Systemic heparin was administered. Standard catheters including a JL 3.5 and a JR 4 were used. All catheter exchanges were made over a wire. We intended not to cross the AoV due to endocarditis but while trying to cannulate the RCA the JR4 catheter dropped across the valve and a pressure was sampled.  Estimated blood loss <50 mL.   During this procedure no sedation was administered.  Medications  (Filter: Administrations occurring from 04/28/18 0920 to 04/28/18 1041)  Medication Rate/Dose/Volume Action  Date Time   lidocaine (PF) (XYLOCAINE) 1 % injection (mL) 2 mL Given 04/28/18 0949   Total dose as of 04/28/18 1727 2 mL Given 0951   4 mL        Radial Cocktail/Verapamil only (mL) 10 mL Given 04/28/18 0957   Total dose as of 04/28/18 1727        10 mL        heparin injection (Units) 5,000 Units Given 04/28/18 1006   Total dose as of 04/28/18 1727        5,000 Units        Heparin (Porcine) in NaCl 1000-0.9 UT/500ML-% SOLN (mL) 500 mL Given 04/28/18 1006   Total dose as of 04/28/18 1727 500 mL Given 1006   1,000 mL        iohexol (OMNIPAQUE) 350 MG/ML injection (mL) 35 mL Given 04/28/18 1021   Total dose as of 04/28/18 1727        35 mL        furosemide (LASIX) injection (mg) 80 mg Given 04/28/18 1030   Total dose as of 04/28/18 1727        80 mg  ALPRAZolam (XANAX) tablet 0.25 mg (mg) *Not included in total [facility] Hold 04/28/18 0925   Dosing weight:  115.4 kg        Total dose as of 04/28/18 1727        Cannot be calculated        aspirin EC tablet 81 mg (mg) *Not included in total [facility] Hold 04/28/18 0925   Dosing weight:  114.8 kg *0 mg - 0  Duplicate 9450   Total dose as of 04/28/18 1727        Cannot be calculated        ceFAZolin (ANCEF) IVPB 2g/100 mL premix (mL/hr) *Not included in total [facility] Hold 04/28/18 0925   Dosing weight:  111.5 kg        Total dose as of 04/28/18 1727        Cannot be calculated        Chlorhexidine Gluconate Cloth 2 % PADS 6 each (each) *Not included in total [facility] Hold 04/28/18 0925   Dosing weight:  121.1 kg        Total dose as of 04/28/18 1727        Cannot be calculated        pantoprazole (PROTONIX) EC tablet 40 mg (mg) *Not included in total [facility] Hold 04/28/18 0925   Dosing weight:  114.8 kg *Not included in total Automatically Held 1000   Total dose as of 04/28/18 1727        Cannot be calculated        sodium chloride flush (NS) 0.9 % injection 10-40 mL (mL) *Not included in total [facility] Hold 04/28/18 0925   Dosing weight:  121.1 kg *Not included in total Automatically Held 1000   Total dose as of 04/28/18 1727        Cannot be calculated        acetaminophen (TYLENOL) solution 650 mg (mg) *Not included in total [facility] Hold 04/28/18 0925   Dosing weight:  114.3 kg        Total dose as of 04/28/18 1727        Cannot be calculated        fentaNYL (SUBLIMAZE) injection 50 mcg (mcg) *Not included in total [facility] Hold 04/28/18 0925   Dosing weight:  115.4 kg        Total dose as of 04/28/18 1727        Cannot be calculated        heparin injection 5,000 Units (Units) *Not included in total [facility] Hold 04/28/18 0925   Dosing weight:  120.4 kg        Total dose as of 04/28/18 1727        Cannot be calculated        Coronary Findings   Diagnostic  Dominance: Right  Left Main  Vessel is angiographically  normal.  Left Anterior Descending  Vessel is angiographically normal.  Left Circumflex  Vessel is angiographically normal.  Right Coronary Artery  Ost RCA to Prox RCA lesion 50% stenosed  Ost RCA to Prox RCA lesion is 50% stenosed.  Mid RCA lesion 20% stenosed  Mid RCA lesion is 20% stenosed.  Intervention   No interventions have been documented.  Coronary Diagrams   Diagnostic  Dominance: Right    Intervention   Implants    No implant documentation for this case.  MERGE Images   Show images for CARDIAC CATHETERIZATION   Link to Procedure Log   Procedure Log  Hemo Data    Most Recent Value  Fick Cardiac Output 5.33 L/min  Fick Cardiac Output Index 2.46 (L/min)/BSA  RA A Wave 13 mmHg  RA V Wave 16 mmHg  RA Mean 12 mmHg  RV Systolic Pressure 62 mmHg  RV Diastolic Pressure 2 mmHg  RV EDP 16 mmHg  PA Systolic Pressure 61 mmHg  PA Diastolic Pressure 28 mmHg  PA Mean 38 mmHg  PW A Wave 24 mmHg  PW V Wave 28 mmHg  PW Mean 22 mmHg  AO Systolic Pressure 95 mmHg  AO Diastolic Pressure 40 mmHg  AO Mean 63 mmHg  LV Systolic Pressure 914 mmHg  LV Diastolic Pressure 11 mmHg  LV EDP 26 mmHg  LVp Systolic Pressure 782 mmHg  LVp Diastolic Pressure 10 mmHg  LVp EDP Pressure 25 mmHg  QP/QS 1  TPVR Index 15.43 HRUI  TSVR Index 25.57 HRUI  PVR SVR Ratio 0.31  TPVR/TSVR Ratio 0.6    Assessment/Plan:  This 67 year old woman presented with a septic left hip with positive blood cultures growing staph Lugdenensis which is the same bacteria that grew from the incision and drainage of her left hip.  This is most likely related to a left hip steroid injection given in November 2019.  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  I do not see any evidence of perivalvular infection on her TEE from 04/17/2018 although she has had some heart block.  She also had severe mitral regurgitation with a  structurally normal mitral valve and no evidence of vegetation.  I suspect that her mitral regurgitation is probably related to left ventricular distention and mitral annular dilation.  Cardiac catheterization today shows severe pulmonary hypertension likely related to the combination of severe AI and severe MR.  Coronary angiography shows a 50% ostial and proximal right coronary stenosis which I do not think is clinically significant at this time.  She has had recurrent shortness of breath and hypoxemia and a chest x-ray showing pulmonary edema.  She has improved with some diuresis this afternoon and her oxygen saturation now is 97% on 3 L.  She does have a few rales on exam.  She has required Neo-Synephrine to support her blood pressure and has a low diastolic blood pressure.  Her mixed venous oxygen saturation was 47% at the time of catheterization but a repeat was 65%.  She does have some signs of cardiogenic shock and I think it would be best to proceed with aortic valve replacement and possible mitral valve repair tomorrow morning assuming that she continues to diurese and her chest x-ray looks satisfactory early in the morning.  With her morbid obesity and prior lower lobectomy I do not think she should go to the operating room with significant pulmonary edema and hypoxemia.  I discussed the echo and catheterization findings with the patient and her daughter at the bedside.  They understand the high risk nature of the surgery. I discussed the operative procedure with the patient and daughter including alternatives, benefits and risks; including but not limited to bleeding, blood transfusion, infection, stroke, myocardial infarction, graft failure, heart block requiring a permanent pacemaker, organ dysfunction, and death.  Elizabeth [name] understands and agrees to proceed.  We will schedule surgery for tomorrow morning  I spent 60 minutes performing this consultation and > 50% of this time was spent face to  face counseling and coordinating the care of this patient's aortic valve endocarditis with severe  AI and severe MR.  Gaye Pollack 04/28/2018, 5:06 PM

## 2018-04-29 ENCOUNTER — Inpatient Hospital Stay (HOSPITAL_COMMUNITY): Payer: Medicare Other | Admitting: Certified Registered Nurse Anesthetist

## 2018-04-29 ENCOUNTER — Inpatient Hospital Stay (HOSPITAL_COMMUNITY): Payer: Medicare Other

## 2018-04-29 ENCOUNTER — Encounter (HOSPITAL_COMMUNITY): Payer: Self-pay | Admitting: Anesthesiology

## 2018-04-29 ENCOUNTER — Encounter (HOSPITAL_COMMUNITY)
Admission: EM | Disposition: A | Discharge status: Skilled Nursing Facility | Payer: Self-pay | Source: Home / Self Care | Attending: Surgery

## 2018-04-29 DIAGNOSIS — I340 Nonrheumatic mitral (valve) insufficiency: Secondary | ICD-10-CM

## 2018-04-29 DIAGNOSIS — Z952 Presence of prosthetic heart valve: Secondary | ICD-10-CM

## 2018-04-29 DIAGNOSIS — Z9889 Other specified postprocedural states: Secondary | ICD-10-CM

## 2018-04-29 HISTORY — PX: AORTIC VALVE REPLACEMENT: SHX41

## 2018-04-29 HISTORY — PX: MITRAL VALVE REPLACEMENT: SHX147

## 2018-04-29 LAB — POCT I-STAT 3, ART BLOOD GAS (G3+)
Acid-Base Excess: 8 mmol/L — ABNORMAL HIGH (ref 0.0–2.0)
Acid-Base Excess: 1 mmol/L (ref 0.0–2.0)
Acid-Base Excess: 2 mmol/L (ref 0.0–2.0)
Bicarbonate: 32.8 mmol/L — ABNORMAL HIGH (ref 20.0–28.0)
Bicarbonate: 29 mmol/L — ABNORMAL HIGH (ref 20.0–28.0)
Bicarbonate: 28.3 mmol/L — ABNORMAL HIGH (ref 20.0–28.0)
O2 Saturation: 100 %
O2 Saturation: 92 %
O2 Saturation: 97 %
PH ART: 7.462 — AB (ref 7.350–7.450)
Patient temperature: 37.1
Patient temperature: 36.8
TCO2: 34 mmol/L — ABNORMAL HIGH (ref 22–32)
TCO2: 31 mmol/L (ref 22–32)
TCO2: 30 mmol/L (ref 22–32)
pCO2 arterial: 45.9 mmHg (ref 32.0–48.0)
pCO2 arterial: 61.4 mmHg — ABNORMAL HIGH (ref 32.0–48.0)
pCO2 arterial: 51.6 mmHg — ABNORMAL HIGH (ref 32.0–48.0)
pH, Arterial: 7.283 — ABNORMAL LOW (ref 7.350–7.450)
pH, Arterial: 7.346 — ABNORMAL LOW (ref 7.350–7.450)
pO2, Arterial: 320 mmHg — ABNORMAL HIGH (ref 83.0–108.0)
pO2, Arterial: 75 mmHg — ABNORMAL LOW (ref 83.0–108.0)
pO2, Arterial: 100 mmHg (ref 83.0–108.0)

## 2018-04-29 LAB — BASIC METABOLIC PANEL
Anion gap: 13 (ref 5–15)
BUN: 31 mg/dL — ABNORMAL HIGH (ref 8–23)
CALCIUM: 7.5 mg/dL — AB (ref 8.9–10.3)
CO2: 30 mmol/L (ref 22–32)
Chloride: 96 mmol/L — ABNORMAL LOW (ref 98–111)
Creatinine, Ser: 0.93 mg/dL (ref 0.44–1.00)
GFR calc Af Amer: >60 mL/min (ref >60)
GFR calc non Af Amer: >60 mL/min (ref >60)
Glucose, Bld: 88 mg/dL (ref 70–99)
Potassium: 3.2 mmol/L — ABNORMAL LOW (ref 3.5–5.1)
Sodium: 139 mmol/L (ref 135–145)

## 2018-04-29 LAB — POCT I-STAT, CHEM 8
BUN: 29 mg/dL — ABNORMAL HIGH (ref 8–23)
BUN: 30 mg/dL — ABNORMAL HIGH (ref 8–23)
BUN: 27 mg/dL — ABNORMAL HIGH (ref 8–23)
BUN: 29 mg/dL — ABNORMAL HIGH (ref 8–23)
BUN: 29 mg/dL — ABNORMAL HIGH (ref 8–23)
BUN: 29 mg/dL — ABNORMAL HIGH (ref 8–23)
BUN: 28 mg/dL — ABNORMAL HIGH (ref 8–23)
BUN: 28 mg/dL — ABNORMAL HIGH (ref 8–23)
CALCIUM ION: 0.97 mmol/L — AB (ref 1.15–1.40)
CHLORIDE: 97 mmol/L — AB (ref 98–111)
CREATININE: 0.8 mg/dL (ref 0.44–1.00)
Calcium, Ion: 1.03 mmol/L — ABNORMAL LOW (ref 1.15–1.40)
Calcium, Ion: 1.07 mmol/L — ABNORMAL LOW (ref 1.15–1.40)
Calcium, Ion: 0.94 mmol/L — ABNORMAL LOW (ref 1.15–1.40)
Calcium, Ion: 0.97 mmol/L — ABNORMAL LOW (ref 1.15–1.40)
Calcium, Ion: 0.97 mmol/L — ABNORMAL LOW (ref 1.15–1.40)
Calcium, Ion: 1.04 mmol/L — ABNORMAL LOW (ref 1.15–1.40)
Calcium, Ion: 1.03 mmol/L — ABNORMAL LOW (ref 1.15–1.40)
Chloride: 94 mmol/L — ABNORMAL LOW (ref 98–111)
Chloride: 96 mmol/L — ABNORMAL LOW (ref 98–111)
Chloride: 96 mmol/L — ABNORMAL LOW (ref 98–111)
Chloride: 96 mmol/L — ABNORMAL LOW (ref 98–111)
Chloride: 98 mmol/L (ref 98–111)
Chloride: 100 mmol/L (ref 98–111)
Chloride: 100 mmol/L (ref 98–111)
Creatinine, Ser: 0.9 mg/dL (ref 0.44–1.00)
Creatinine, Ser: 0.9 mg/dL (ref 0.44–1.00)
Creatinine, Ser: 0.8 mg/dL (ref 0.44–1.00)
Creatinine, Ser: 0.8 mg/dL (ref 0.44–1.00)
Creatinine, Ser: 0.8 mg/dL (ref 0.44–1.00)
Creatinine, Ser: 0.7 mg/dL (ref 0.44–1.00)
Creatinine, Ser: 0.8 mg/dL (ref 0.44–1.00)
Glucose, Bld: 113 mg/dL — ABNORMAL HIGH (ref 70–99)
Glucose, Bld: 94 mg/dL (ref 70–99)
Glucose, Bld: 113 mg/dL — ABNORMAL HIGH (ref 70–99)
Glucose, Bld: 110 mg/dL — ABNORMAL HIGH (ref 70–99)
Glucose, Bld: 117 mg/dL — ABNORMAL HIGH (ref 70–99)
Glucose, Bld: 119 mg/dL — ABNORMAL HIGH (ref 70–99)
Glucose, Bld: 161 mg/dL — ABNORMAL HIGH (ref 70–99)
Glucose, Bld: 147 mg/dL — ABNORMAL HIGH (ref 70–99)
HCT: 21 % — ABNORMAL LOW (ref 36.0–46.0)
HCT: 22 % — ABNORMAL LOW (ref 36.0–46.0)
HCT: 26 % — ABNORMAL LOW (ref 36.0–46.0)
HCT: 26 % — ABNORMAL LOW (ref 36.0–46.0)
HCT: 26 % — ABNORMAL LOW (ref 36.0–46.0)
HCT: 32 % — ABNORMAL LOW (ref 36.0–46.0)
HEMATOCRIT: 25 % — AB (ref 36.0–46.0)
HEMATOCRIT: 27 % — AB (ref 36.0–46.0)
HEMOGLOBIN: 7.5 g/dL — AB (ref 12.0–15.0)
HEMOGLOBIN: 9.2 g/dL — AB (ref 12.0–15.0)
HEMOGLOBIN: 10.9 g/dL — AB (ref 12.0–15.0)
Hemoglobin: 7.1 g/dL — ABNORMAL LOW (ref 12.0–15.0)
Hemoglobin: 8.5 g/dL — ABNORMAL LOW (ref 12.0–15.0)
Hemoglobin: 8.8 g/dL — ABNORMAL LOW (ref 12.0–15.0)
Hemoglobin: 8.8 g/dL — ABNORMAL LOW (ref 12.0–15.0)
Hemoglobin: 8.8 g/dL — ABNORMAL LOW (ref 12.0–15.0)
POTASSIUM: 4.4 mmol/L (ref 3.5–5.1)
Potassium: 2.9 mmol/L — ABNORMAL LOW (ref 3.5–5.1)
Potassium: 3.8 mmol/L (ref 3.5–5.1)
Potassium: 3.7 mmol/L (ref 3.5–5.1)
Potassium: 3.9 mmol/L (ref 3.5–5.1)
Potassium: 3.6 mmol/L (ref 3.5–5.1)
Potassium: 4 mmol/L (ref 3.5–5.1)
Potassium: 4.1 mmol/L (ref 3.5–5.1)
SODIUM: 137 mmol/L (ref 135–145)
SODIUM: 137 mmol/L (ref 135–145)
SODIUM: 138 mmol/L (ref 135–145)
Sodium: 138 mmol/L (ref 135–145)
Sodium: 138 mmol/L (ref 135–145)
Sodium: 138 mmol/L (ref 135–145)
Sodium: 137 mmol/L (ref 135–145)
Sodium: 139 mmol/L (ref 135–145)
TCO2: 34 mmol/L — AB (ref 22–32)
TCO2: 36 mmol/L — ABNORMAL HIGH (ref 22–32)
TCO2: 32 mmol/L (ref 22–32)
TCO2: 36 mmol/L — ABNORMAL HIGH (ref 22–32)
TCO2: 36 mmol/L — ABNORMAL HIGH (ref 22–32)
TCO2: 36 mmol/L — ABNORMAL HIGH (ref 22–32)
TCO2: 31 mmol/L (ref 22–32)
TCO2: 27 mmol/L (ref 22–32)

## 2018-04-29 LAB — POCT I-STAT 4, (NA,K, GLUC, HGB,HCT)
Glucose, Bld: 131 mg/dL — ABNORMAL HIGH (ref 70–99)
HCT: 32 % — ABNORMAL LOW (ref 36.0–46.0)
Hemoglobin: 10.9 g/dL — ABNORMAL LOW (ref 12.0–15.0)
POTASSIUM: 3.6 mmol/L (ref 3.5–5.1)
Sodium: 139 mmol/L (ref 135–145)

## 2018-04-29 LAB — PREPARE RBC (CROSSMATCH)

## 2018-04-29 LAB — GLUCOSE, CAPILLARY
Glucose-Capillary: 131 mg/dL — ABNORMAL HIGH (ref 70–99)
Glucose-Capillary: 131 mg/dL — ABNORMAL HIGH (ref 70–99)
Glucose-Capillary: 132 mg/dL — ABNORMAL HIGH (ref 70–99)
Glucose-Capillary: 110 mg/dL — ABNORMAL HIGH (ref 70–99)
Glucose-Capillary: 142 mg/dL — ABNORMAL HIGH (ref 70–99)
Glucose-Capillary: 127 mg/dL — ABNORMAL HIGH (ref 70–99)

## 2018-04-29 LAB — HEMOGLOBIN AND HEMATOCRIT, BLOOD
HCT: 24 % — ABNORMAL LOW (ref 36.0–46.0)
Hemoglobin: 7.7 g/dL — ABNORMAL LOW (ref 12.0–15.0)

## 2018-04-29 LAB — CBC
HCT: 26.2 % — ABNORMAL LOW (ref 36.0–46.0)
HCT: 35 % — ABNORMAL LOW (ref 36.0–46.0)
HCT: 32.6 % — ABNORMAL LOW (ref 36.0–46.0)
HEMOGLOBIN: 11 g/dL — AB (ref 12.0–15.0)
Hemoglobin: 7.7 g/dL — ABNORMAL LOW (ref 12.0–15.0)
Hemoglobin: 10.3 g/dL — ABNORMAL LOW (ref 12.0–15.0)
MCH: 29.2 pg (ref 26.0–34.0)
MCH: 29 pg (ref 26.0–34.0)
MCH: 28.8 pg (ref 26.0–34.0)
MCHC: 29.4 g/dL — ABNORMAL LOW (ref 30.0–36.0)
MCHC: 31.4 g/dL (ref 30.0–36.0)
MCHC: 31.6 g/dL (ref 30.0–36.0)
MCV: 99.2 fL (ref 80.0–100.0)
MCV: 92.3 fL (ref 80.0–100.0)
MCV: 91.1 fL (ref 80.0–100.0)
Platelets: 227 10*3/uL (ref 150–400)
Platelets: 96 10*3/uL — ABNORMAL LOW (ref 150–400)
Platelets: 93 10*3/uL — ABNORMAL LOW (ref 150–400)
RBC: 2.64 MIL/uL — ABNORMAL LOW (ref 3.87–5.11)
RBC: 3.79 MIL/uL — ABNORMAL LOW (ref 3.87–5.11)
RBC: 3.58 MIL/uL — ABNORMAL LOW (ref 3.87–5.11)
RDW: 16.8 % — ABNORMAL HIGH (ref 11.5–15.5)
RDW: 16.5 % — ABNORMAL HIGH (ref 11.5–15.5)
RDW: 17.1 % — ABNORMAL HIGH (ref 11.5–15.5)
WBC: 21 10*3/uL — ABNORMAL HIGH (ref 4.0–10.5)
WBC: 22.9 10*3/uL — ABNORMAL HIGH (ref 4.0–10.5)
WBC: 17.2 10*3/uL — ABNORMAL HIGH (ref 4.0–10.5)
nRBC: 0.6 % — ABNORMAL HIGH (ref 0.0–0.2)
nRBC: 0.8 % — ABNORMAL HIGH (ref 0.0–0.2)
nRBC: 0.9 % — ABNORMAL HIGH (ref 0.0–0.2)

## 2018-04-29 LAB — CREATININE, SERUM
Creatinine, Ser: 0.89 mg/dL (ref 0.44–1.00)
GFR calc Af Amer: >60 mL/min (ref >60)
GFR calc non Af Amer: >60 mL/min (ref >60)

## 2018-04-29 LAB — MAGNESIUM: Magnesium: 2.9 mg/dL — ABNORMAL HIGH (ref 1.7–2.4)

## 2018-04-29 LAB — COOXEMETRY PANEL
Carboxyhemoglobin: 2.2 % — ABNORMAL HIGH (ref 0.5–1.5)
Methemoglobin: 1 % (ref 0.0–1.5)
O2 Saturation: 54.3 %
Total hemoglobin: 7.9 g/dL — ABNORMAL LOW (ref 12.0–16.0)

## 2018-04-29 LAB — PROTIME-INR
INR: 2.15
Prothrombin Time: 23.7 seconds — ABNORMAL HIGH (ref 11.4–15.2)

## 2018-04-29 LAB — HEPARIN LEVEL (UNFRACTIONATED): Heparin Unfractionated: <0.1 IU/mL — ABNORMAL LOW (ref 0.30–0.70)

## 2018-04-29 LAB — APTT: aPTT: 35 seconds (ref 24–36)

## 2018-04-29 LAB — PLATELET COUNT: Platelets: 90 10*3/uL — ABNORMAL LOW (ref 150–400)

## 2018-04-29 SURGERY — REPLACEMENT, AORTIC VALVE, OPEN
Anesthesia: General | Site: Chest

## 2018-04-29 MED ORDER — THROMBIN 5000 UNITS EX SOLR
CUTANEOUS | Status: AC
  Filled 2018-04-29: qty 10000

## 2018-04-29 MED ORDER — CALCIUM CHLORIDE 10 % IV SOLN
INTRAVENOUS | Status: DC | PRN
  Administered 2018-04-29 (×5): 100 mg via INTRAVENOUS

## 2018-04-29 MED ORDER — ROCURONIUM BROMIDE 50 MG/5ML IV SOSY
PREFILLED_SYRINGE | INTRAVENOUS | Status: AC
  Filled 2018-04-29: qty 10

## 2018-04-29 MED ORDER — ASPIRIN 81 MG PO CHEW: 324.0000 mg | CHEWABLE_TABLET | Freq: Every day | ORAL | Status: DC

## 2018-04-29 MED ORDER — FENTANYL CITRATE (PF) 250 MCG/5ML IJ SOLN
INTRAMUSCULAR | Status: DC | PRN
  Administered 2018-04-29 (×3): 100 ug via INTRAVENOUS
  Administered 2018-04-29: 200 ug via INTRAVENOUS
  Administered 2018-04-29: 125 ug via INTRAVENOUS
  Administered 2018-04-29 (×2): 50 ug via INTRAVENOUS
  Administered 2018-04-29: 150 ug via INTRAVENOUS
  Administered 2018-04-29 (×2): 50 ug via INTRAVENOUS
  Administered 2018-04-29: 125 ug via INTRAVENOUS

## 2018-04-29 MED ORDER — MIDAZOLAM HCL (PF) 10 MG/2ML IJ SOLN
INTRAMUSCULAR | Status: AC
  Filled 2018-04-29: qty 2

## 2018-04-29 MED ORDER — INSULIN REGULAR BOLUS VIA INFUSION
0.0000 [IU] | Freq: Three times a day (TID) | INTRAVENOUS | Status: DC
  Filled 2018-04-29: qty 10

## 2018-04-29 MED ORDER — ACETAMINOPHEN 160 MG/5ML PO SOLN: 650.0000 mg | Freq: Once | ORAL | Status: AC

## 2018-04-29 MED ORDER — ALBUMIN HUMAN 5 % IV SOLN
250.0000 mL | INTRAVENOUS | Status: AC | PRN
  Administered 2018-04-29 – 2018-04-30 (×4): 12.5 g via INTRAVENOUS
  Filled 2018-04-29 (×2): qty 250

## 2018-04-29 MED ORDER — MIDAZOLAM HCL 5 MG/5ML IJ SOLN
INTRAMUSCULAR | Status: DC | PRN
  Administered 2018-04-29: 3 mg via INTRAVENOUS
  Administered 2018-04-29: 2 mg via INTRAVENOUS
  Administered 2018-04-29 (×2): 1 mg via INTRAVENOUS

## 2018-04-29 MED ORDER — 0.9 % SODIUM CHLORIDE (POUR BTL) OPTIME
TOPICAL | Status: DC | PRN
  Administered 2018-04-29: 5000 mL

## 2018-04-29 MED ORDER — BISACODYL 10 MG RE SUPP: 10.0000 mg | Freq: Every day | RECTAL | Status: DC

## 2018-04-29 MED ORDER — HEPARIN SODIUM (PORCINE) 1000 UNIT/ML IJ SOLN
INTRAMUSCULAR | Status: AC
  Filled 2018-04-29: qty 1

## 2018-04-29 MED ORDER — INSULIN REGULAR(HUMAN) IN NACL 100-0.9 UT/100ML-% IV SOLN
INTRAVENOUS | Status: DC
  Administered 2018-04-29: 1.4 [IU]/h via INTRAVENOUS

## 2018-04-29 MED ORDER — NITROGLYCERIN IN D5W 200-5 MCG/ML-% IV SOLN: 0.0000 ug/min | INTRAVENOUS | Status: DC

## 2018-04-29 MED ORDER — LACTATED RINGERS IV SOLN
INTRAVENOUS | Status: DC | PRN
  Administered 2018-04-29: 08:00:00 via INTRAVENOUS

## 2018-04-29 MED ORDER — HEMOSTATIC AGENTS (NO CHARGE) OPTIME
TOPICAL | Status: DC | PRN
  Administered 2018-04-29: 1 via TOPICAL
  Administered 2018-04-29: 3 via TOPICAL

## 2018-04-29 MED ORDER — FAMOTIDINE IN NACL 20-0.9 MG/50ML-% IV SOLN
20.0000 mg | Freq: Two times a day (BID) | INTRAVENOUS | Status: AC
  Administered 2018-04-29 – 2018-04-30 (×2): 20 mg via INTRAVENOUS
  Filled 2018-04-29 (×2): qty 50

## 2018-04-29 MED ORDER — PROTAMINE SULFATE 10 MG/ML IV SOLN
INTRAVENOUS | Status: DC | PRN
  Administered 2018-04-29: 10 mg via INTRAVENOUS
  Administered 2018-04-29: 340 mg via INTRAVENOUS

## 2018-04-29 MED ORDER — POTASSIUM CHLORIDE 10 MEQ/50ML IV SOLN
10.0000 meq | INTRAVENOUS | Status: DC
  Administered 2018-04-29 (×2): 10 meq via INTRAVENOUS
  Filled 2018-04-29 (×3): qty 50

## 2018-04-29 MED ORDER — ACETAMINOPHEN 160 MG/5ML PO SOLN
1000.0000 mg | Freq: Four times a day (QID) | ORAL | Status: DC
  Filled 2018-04-29: qty 40.6

## 2018-04-29 MED ORDER — POTASSIUM CHLORIDE 10 MEQ/50ML IV SOLN
10.0000 meq | INTRAVENOUS | Status: AC
  Administered 2018-04-29 (×3): 10 meq via INTRAVENOUS

## 2018-04-29 MED ORDER — MAGNESIUM SULFATE 4 GM/100ML IV SOLN
4.0000 g | Freq: Once | INTRAVENOUS | Status: AC
  Administered 2018-04-29: 4 g via INTRAVENOUS
  Filled 2018-04-29: qty 100

## 2018-04-29 MED ORDER — TRAMADOL HCL 50 MG PO TABS: 50.0000 mg | ORAL_TABLET | ORAL | Status: DC | PRN

## 2018-04-29 MED ORDER — PHENYLEPHRINE 40 MCG/ML (10ML) SYRINGE FOR IV PUSH (FOR BLOOD PRESSURE SUPPORT)
PREFILLED_SYRINGE | INTRAVENOUS | Status: AC
  Filled 2018-04-29: qty 10

## 2018-04-29 MED ORDER — ACETAMINOPHEN 500 MG PO TABS
1000.0000 mg | ORAL_TABLET | Freq: Four times a day (QID) | ORAL | Status: DC
  Administered 2018-04-30 – 2018-05-02 (×9): 1000 mg via ORAL
  Filled 2018-04-29 (×10): qty 2

## 2018-04-29 MED ORDER — DEXMEDETOMIDINE HCL IN NACL 400 MCG/100ML IV SOLN
0.0000 ug/kg/h | INTRAVENOUS | Status: DC
  Administered 2018-04-29: 0.7 ug/kg/h via INTRAVENOUS
  Filled 2018-04-29: qty 200

## 2018-04-29 MED ORDER — TRANEXAMIC ACID-NACL 1000-0.7 MG/100ML-% IV SOLN
1000.0000 mg | INTRAVENOUS | Status: DC
  Filled 2018-04-29: qty 100

## 2018-04-29 MED ORDER — PROTAMINE SULFATE 10 MG/ML IV SOLN
INTRAVENOUS | Status: AC
  Filled 2018-04-29: qty 25

## 2018-04-29 MED ORDER — ROCURONIUM BROMIDE 10 MG/ML (PF) SYRINGE
PREFILLED_SYRINGE | INTRAVENOUS | Status: DC | PRN
  Administered 2018-04-29: 70 mg via INTRAVENOUS
  Administered 2018-04-29: 30 mg via INTRAVENOUS
  Administered 2018-04-29: 20 mg via INTRAVENOUS
  Administered 2018-04-29 (×2): 30 mg via INTRAVENOUS

## 2018-04-29 MED ORDER — DOCUSATE SODIUM 100 MG PO CAPS
200.0000 mg | ORAL_CAPSULE | Freq: Every day | ORAL | Status: DC
  Administered 2018-04-30 – 2018-05-05 (×6): 200 mg via ORAL
  Filled 2018-04-29 (×6): qty 2

## 2018-04-29 MED ORDER — PROPOFOL 10 MG/ML IV BOLUS
INTRAVENOUS | Status: AC
  Filled 2018-04-29: qty 20

## 2018-04-29 MED ORDER — METOPROLOL TARTRATE 12.5 MG HALF TABLET: 12.5000 mg | ORAL_TABLET | Freq: Two times a day (BID) | ORAL | Status: DC

## 2018-04-29 MED ORDER — LEVOFLOXACIN IN D5W 750 MG/150ML IV SOLN
750.0000 mg | INTRAVENOUS | Status: AC
  Administered 2018-04-30: 750 mg via INTRAVENOUS
  Filled 2018-04-29: qty 150

## 2018-04-29 MED ORDER — ONDANSETRON HCL 4 MG/2ML IJ SOLN
4.0000 mg | Freq: Four times a day (QID) | INTRAMUSCULAR | Status: DC | PRN
  Administered 2018-04-30: 4 mg via INTRAVENOUS
  Filled 2018-04-29: qty 2

## 2018-04-29 MED ORDER — SODIUM CHLORIDE 0.9 % IV SOLN
INTRAVENOUS | Status: DC
  Administered 2018-04-29: 20 mL/h via INTRAVENOUS

## 2018-04-29 MED ORDER — PANTOPRAZOLE SODIUM 40 MG PO TBEC
40.0000 mg | DELAYED_RELEASE_TABLET | Freq: Every day | ORAL | Status: DC
  Administered 2018-05-01 – 2018-05-06 (×6): 40 mg via ORAL
  Filled 2018-04-29 (×6): qty 1

## 2018-04-29 MED ORDER — VASOPRESSIN 20 UNIT/ML IV SOLN
INTRAVENOUS | Status: DC | PRN
  Administered 2018-04-29 (×4): 1 [IU] via INTRAVENOUS

## 2018-04-29 MED ORDER — BISACODYL 5 MG PO TBEC
10.0000 mg | DELAYED_RELEASE_TABLET | Freq: Every day | ORAL | Status: DC
  Administered 2018-04-30 – 2018-05-03 (×4): 10 mg via ORAL
  Filled 2018-04-29 (×4): qty 2

## 2018-04-29 MED ORDER — SODIUM CHLORIDE 0.9% FLUSH: 3.0000 mL | INTRAVENOUS | Status: DC | PRN

## 2018-04-29 MED ORDER — VASOPRESSIN 20 UNIT/ML IV SOLN
INTRAVENOUS | Status: AC
  Filled 2018-04-29: qty 1

## 2018-04-29 MED ORDER — CHLORHEXIDINE GLUCONATE 0.12% ORAL RINSE (MEDLINE KIT)
15.0000 mL | Freq: Two times a day (BID) | OROMUCOSAL | Status: DC
  Administered 2018-04-29: 15 mL via OROMUCOSAL

## 2018-04-29 MED ORDER — MORPHINE SULFATE (PF) 2 MG/ML IV SOLN
1.0000 mg | INTRAVENOUS | Status: DC | PRN
  Administered 2018-04-29 – 2018-05-06 (×6): 2 mg via INTRAVENOUS
  Filled 2018-04-29 (×6): qty 1

## 2018-04-29 MED ORDER — PHENYLEPHRINE HCL-NACL 20-0.9 MG/250ML-% IV SOLN
0.0000 ug/min | INTRAVENOUS | Status: DC
  Administered 2018-04-29: 40 ug/min via INTRAVENOUS
  Filled 2018-04-29: qty 250

## 2018-04-29 MED ORDER — LACTATED RINGERS IV SOLN
INTRAVENOUS | Status: DC | PRN
  Administered 2018-04-29 (×2): via INTRAVENOUS

## 2018-04-29 MED ORDER — OXYCODONE HCL 5 MG PO TABS
5.0000 mg | ORAL_TABLET | ORAL | Status: DC | PRN
  Administered 2018-04-30: 10 mg via ORAL
  Administered 2018-05-01 (×2): 5 mg via ORAL
  Administered 2018-05-02: 10 mg via ORAL
  Administered 2018-05-02 – 2018-05-05 (×6): 5 mg via ORAL
  Administered 2018-05-05 – 2018-05-11 (×6): 10 mg via ORAL
  Filled 2018-04-29 (×4): qty 1
  Filled 2018-04-29: qty 2
  Filled 2018-04-29 (×3): qty 1
  Filled 2018-04-29 (×3): qty 2
  Filled 2018-04-29: qty 1
  Filled 2018-04-29 (×4): qty 2

## 2018-04-29 MED ORDER — LACTATED RINGERS IV SOLN
INTRAVENOUS | Status: DC
  Administered 2018-04-29: 20 mL/h via INTRAVENOUS

## 2018-04-29 MED ORDER — METOPROLOL TARTRATE 5 MG/5ML IV SOLN: 2.5000 mg | INTRAVENOUS | Status: DC | PRN

## 2018-04-29 MED ORDER — ASPIRIN EC 325 MG PO TBEC
325.0000 mg | DELAYED_RELEASE_TABLET | Freq: Every day | ORAL | Status: DC
  Administered 2018-04-30: 325 mg via ORAL
  Filled 2018-04-29: qty 1

## 2018-04-29 MED ORDER — PROPOFOL 10 MG/ML IV BOLUS
INTRAVENOUS | Status: DC | PRN
  Administered 2018-04-29: 90 mg via INTRAVENOUS

## 2018-04-29 MED ORDER — MIDAZOLAM HCL 2 MG/2ML IJ SOLN: 2.0000 mg | INTRAMUSCULAR | Status: DC | PRN

## 2018-04-29 MED ORDER — PROTAMINE SULFATE 10 MG/ML IV SOLN
INTRAVENOUS | Status: AC
  Filled 2018-04-29: qty 5

## 2018-04-29 MED ORDER — DEXMEDETOMIDINE HCL IN NACL 200 MCG/50ML IV SOLN
0.0000 ug/kg/h | INTRAVENOUS | Status: DC
  Administered 2018-04-29: 0.7 ug/kg/h via INTRAVENOUS
  Filled 2018-04-29: qty 50

## 2018-04-29 MED ORDER — ALBUMIN HUMAN 5 % IV SOLN
INTRAVENOUS | Status: DC | PRN
  Administered 2018-04-29: 14:00:00 via INTRAVENOUS

## 2018-04-29 MED ORDER — METOPROLOL TARTRATE 25 MG/10 ML ORAL SUSPENSION: 12.5000 mg | Freq: Two times a day (BID) | ORAL | Status: DC

## 2018-04-29 MED ORDER — SODIUM CHLORIDE 0.9% IV SOLUTION: Freq: Once | INTRAVENOUS | Status: DC

## 2018-04-29 MED ORDER — ORAL CARE MOUTH RINSE
15.0000 mL | OROMUCOSAL | Status: DC
  Administered 2018-04-29 – 2018-04-30 (×3): 15 mL via OROMUCOSAL

## 2018-04-29 MED ORDER — ACETAMINOPHEN 650 MG RE SUPP
650.0000 mg | Freq: Once | RECTAL | Status: AC
  Administered 2018-04-29: 650 mg via RECTAL

## 2018-04-29 MED ORDER — LACTATED RINGERS IV SOLN
INTRAVENOUS | Status: DC
  Administered 2018-05-03: 19:00:00 via INTRAVENOUS

## 2018-04-29 MED ORDER — SODIUM CHLORIDE 0.45 % IV SOLN
INTRAVENOUS | Status: DC | PRN
  Administered 2018-04-29: 20 mL/h via INTRAVENOUS

## 2018-04-29 MED ORDER — LACTATED RINGERS IV SOLN
INTRAVENOUS | Status: DC | PRN
  Administered 2018-04-29: 07:00:00 via INTRAVENOUS

## 2018-04-29 MED ORDER — LACTATED RINGERS IV SOLN: 500.0000 mL | Freq: Once | INTRAVENOUS | Status: DC | PRN

## 2018-04-29 MED ORDER — FENTANYL CITRATE (PF) 250 MCG/5ML IJ SOLN
INTRAMUSCULAR | Status: AC
  Filled 2018-04-29: qty 25

## 2018-04-29 MED ORDER — CHLORHEXIDINE GLUCONATE 0.12 % MT SOLN
15.0000 mL | OROMUCOSAL | Status: AC
  Administered 2018-04-29: 15 mL via OROMUCOSAL

## 2018-04-29 MED ORDER — VANCOMYCIN HCL IN DEXTROSE 1-5 GM/200ML-% IV SOLN
1000.0000 mg | Freq: Once | INTRAVENOUS | Status: AC
  Administered 2018-04-29: 1000 mg via INTRAVENOUS
  Filled 2018-04-29: qty 200

## 2018-04-29 MED ORDER — SODIUM CHLORIDE 0.9% FLUSH
3.0000 mL | Freq: Two times a day (BID) | INTRAVENOUS | Status: DC
  Administered 2018-04-30 – 2018-05-05 (×10): 3 mL via INTRAVENOUS

## 2018-04-29 MED ORDER — HEPARIN SODIUM (PORCINE) 1000 UNIT/ML IJ SOLN
INTRAMUSCULAR | Status: DC | PRN
  Administered 2018-04-29: 5000 [IU] via INTRAVENOUS
  Administered 2018-04-29: 35000 [IU] via INTRAVENOUS

## 2018-04-29 MED ORDER — SODIUM CHLORIDE 0.9 % IV SOLN
250.0000 mL | INTRAVENOUS | Status: DC
  Administered 2018-05-08: 10 mL via INTRAVENOUS

## 2018-04-29 MED ORDER — THROMBIN 5000 UNITS EX SOLR
CUTANEOUS | Status: DC | PRN
  Administered 2018-04-29: 10000 [IU] via TOPICAL

## 2018-04-29 SURGICAL SUPPLY — 97 items
ADAPTER CARDIO PERF ANTE/RETRO (ADAPTER) ×3 IMPLANT
ADPR PRFSN 84XANTGRD RTRGD (ADAPTER) ×1
BLADE STERNUM SYSTEM 6 (BLADE) ×3 IMPLANT
BLADE SURG 15 STRL LF DISP TIS (BLADE) ×1 IMPLANT
BLADE SURG 15 STRL SS (BLADE) ×3
CANISTER SUCT 3000ML PPV (MISCELLANEOUS) ×3 IMPLANT
CANN PRFSN 3/8X14X24FR PCFC (MISCELLANEOUS) ×1
CANNULA AORTIC ROOT 9FR (CANNULA) ×3 IMPLANT
CANNULA ARTERIAL NVNT 3/8 20FR (MISCELLANEOUS) ×3 IMPLANT
CANNULA ARTERIAL NVNT 3/8 22FR (MISCELLANEOUS) ×3 IMPLANT
CANNULA GUNDRY RCSP 15FR (MISCELLANEOUS) ×3 IMPLANT
CANNULA MC2 2 STG 36/46 NON-V (CANNULA) ×1 IMPLANT
CANNULA PRFSN 3/8X14X24FR PCFC (MISCELLANEOUS) ×1 IMPLANT
CANNULA SUMP PERICARDIAL (CANNULA) ×3 IMPLANT
CANNULA VEN MTL TIP RT (MISCELLANEOUS) ×3
CANNULA VENOUS 2 STG 34/46 (CANNULA) ×2
CANNULA VRC MALB SNGL STG 36FR (MISCELLANEOUS) ×1 IMPLANT
CATH HEART VENT LEFT (CATHETERS) ×1 IMPLANT
CATH ROBINSON RED A/P 18FR (CATHETERS) ×12 IMPLANT
CATH THORACIC 28FR (CATHETERS) ×3 IMPLANT
CATH THORACIC 36FR (CATHETERS) ×3 IMPLANT
CATH THORACIC 36FR RT ANG (CATHETERS) ×3 IMPLANT
CONN 1/2X1/2X1/2  BEN (MISCELLANEOUS) ×4
CONN 1/2X1/2X1/2 BEN (MISCELLANEOUS) ×2 IMPLANT
CONN 3/8X1/2 ST GISH (MISCELLANEOUS) ×9 IMPLANT
CONN ST 1/2X1/2  BEN (MISCELLANEOUS) ×2
CONN ST 1/2X1/2 BEN (MISCELLANEOUS) ×1 IMPLANT
CONT SPEC 4OZ CLIKSEAL STRL BL (MISCELLANEOUS) ×6 IMPLANT
CRADLE DONUT ADULT HEAD (MISCELLANEOUS) ×3 IMPLANT
DRAPE CARDIOVASCULAR INCISE (DRAPES) ×3
DRAPE SLUSH/WARMER DISC (DRAPES) ×3 IMPLANT
DRAPE SRG 135X102X78XABS (DRAPES) ×1 IMPLANT
DRSG COVADERM 4X14 (GAUZE/BANDAGES/DRESSINGS) ×3 IMPLANT
ELECT CAUTERY BLADE 6.4 (BLADE) ×3 IMPLANT
ELECT REM PT RETURN 9FT ADLT (ELECTROSURGICAL) ×6
ELECTRODE REM PT RTRN 9FT ADLT (ELECTROSURGICAL) ×2 IMPLANT
FELT TEFLON 1X6 (MISCELLANEOUS) ×9 IMPLANT
GAUZE SPONGE 4X4 12PLY STRL (GAUZE/BANDAGES/DRESSINGS) ×3 IMPLANT
GAUZE SPONGE 4X4 12PLY STRL LF (GAUZE/BANDAGES/DRESSINGS) ×3 IMPLANT
GLOVE BIO SURGEON STRL SZ 6 (GLOVE) ×6 IMPLANT
GLOVE BIO SURGEON STRL SZ 6.5 (GLOVE) IMPLANT
GLOVE BIO SURGEON STRL SZ7 (GLOVE) IMPLANT
GLOVE BIO SURGEON STRL SZ7.5 (GLOVE) IMPLANT
GLOVE BIO SURGEONS STRL SZ 6.5 (GLOVE)
GLOVE EUDERMIC 7 POWDERFREE (GLOVE) ×6 IMPLANT
GOWN STRL REUS W/ TWL LRG LVL3 (GOWN DISPOSABLE) ×4 IMPLANT
GOWN STRL REUS W/ TWL XL LVL3 (GOWN DISPOSABLE) ×1 IMPLANT
GOWN STRL REUS W/TWL LRG LVL3 (GOWN DISPOSABLE) ×12
GOWN STRL REUS W/TWL XL LVL3 (GOWN DISPOSABLE) ×3
HEMOSTAT POWDER SURGIFOAM 1G (HEMOSTASIS) ×9 IMPLANT
HEMOSTAT SURGICEL 2X14 (HEMOSTASIS) ×3 IMPLANT
IV CATH 18G X1.75 CATHLON (IV SOLUTION) ×3 IMPLANT
KIT BASIN OR (CUSTOM PROCEDURE TRAY) ×3 IMPLANT
KIT CATH CPB BARTLE (MISCELLANEOUS) IMPLANT
KIT SUCTION CATH 14FR (SUCTIONS) ×3 IMPLANT
KIT TURNOVER KIT B (KITS) ×3 IMPLANT
LINE VENT (MISCELLANEOUS) ×3 IMPLANT
LOOP VESSEL SUPERMAXI WHITE (MISCELLANEOUS) ×3 IMPLANT
NS IRRIG 1000ML POUR BTL (IV SOLUTION) ×18 IMPLANT
PACK E OPEN HEART (SUTURE) ×3 IMPLANT
PACK OPEN HEART (CUSTOM PROCEDURE TRAY) ×3 IMPLANT
PAD ARMBOARD 7.5X6 YLW CONV (MISCELLANEOUS) ×6 IMPLANT
PAD CARDIAC INSULATION (MISCELLANEOUS) ×3 IMPLANT
RING MITRAL MEMO 4D 28 (Prosthesis & Implant Heart) ×3 IMPLANT
SET CARDIOPLEGIA MPS 5001102 (MISCELLANEOUS) ×3 IMPLANT
SUCKER WEIGHTED FLEX (MISCELLANEOUS) IMPLANT
SUT BONE WAX W31G (SUTURE) ×3 IMPLANT
SUT ETHIBON 2 0 V 52N 30 (SUTURE) ×6 IMPLANT
SUT ETHIBON EXCEL 2-0 V-5 (SUTURE) ×3 IMPLANT
SUT ETHIBOND (SUTURE) ×6 IMPLANT
SUT ETHIBOND 2 0 SH (SUTURE) ×3 IMPLANT
SUT ETHIBOND 2 0 SH 36X2 (SUTURE) ×1 IMPLANT
SUT ETHIBOND 2 0 V4 (SUTURE) IMPLANT
SUT ETHIBOND 2 0V4 GREEN (SUTURE) IMPLANT
SUT ETHIBOND 2-0 RB-1 WHT (SUTURE) ×6 IMPLANT
SUT ETHIBOND V-5 VALVE (SUTURE) ×3 IMPLANT
SUT PROLENE 3 0 SH DA (SUTURE) IMPLANT
SUT PROLENE 3 0 SH1 36 (SUTURE) ×6 IMPLANT
SUT PROLENE 4 0 RB 1 (SUTURE) ×12
SUT PROLENE 4 0 SH DA (SUTURE) ×3 IMPLANT
SUT PROLENE 4-0 RB1 .5 CRCL 36 (SUTURE) ×4 IMPLANT
SUT PROLENE 6 0 C 1 30 (SUTURE) ×6 IMPLANT
SUT SILK 2 0 SH CR/8 (SUTURE) ×3 IMPLANT
SUT STEEL 6MS V (SUTURE) IMPLANT
SUT VIC AB 1 CTX 36 (SUTURE) ×4
SUT VIC AB 1 CTX36XBRD ANBCTR (SUTURE) ×2 IMPLANT
SYSTEM SAHARA CHEST DRAIN ATS (WOUND CARE) ×6 IMPLANT
TAPE CLOTH SURG 4X10 WHT LF (GAUZE/BANDAGES/DRESSINGS) ×3 IMPLANT
TOWEL GREEN STERILE (TOWEL DISPOSABLE) ×3 IMPLANT
TOWEL GREEN STERILE FF (TOWEL DISPOSABLE) ×3 IMPLANT
TRAY FOLEY SLVR 16FR TEMP STAT (SET/KITS/TRAYS/PACK) ×3 IMPLANT
TUBE SUCT INTRACARD DLP 20F (MISCELLANEOUS) ×3 IMPLANT
UNDERPAD 30X30 (UNDERPADS AND DIAPERS) ×3 IMPLANT
VALVE AORTIC SZ19 INSP/RESIL (Valve) ×3 IMPLANT
VENT LEFT HEART 12002 (CATHETERS) ×3
VRC MALLEABLE SINGLE STG 36FR (MISCELLANEOUS) ×3
WATER STERILE IRR 1000ML POUR (IV SOLUTION) ×6 IMPLANT

## 2018-04-29 NOTE — Anesthesia Procedure Notes (Signed)
Procedure Name: Intubation Date/Time: 04/29/2018 8:19 AM Performed by: Clearnce Sorrel, CRNA Pre-anesthesia Checklist: Patient identified, Emergency Drugs available, Suction available, Patient being monitored and Timeout performed Patient Re-evaluated:Patient Re-evaluated prior to induction Oxygen Delivery Method: Circle system utilized Preoxygenation: Pre-oxygenation with 100% oxygen Induction Type: IV induction Ventilation: Mask ventilation without difficulty and Oral airway inserted - appropriate to patient size Laryngoscope Size: Mac and 3 Grade View: Grade I Tube type: Subglottic suction tube Tube size: 8.5 mm Number of attempts: 1 Airway Equipment and Method: Stylet Placement Confirmation: ETT inserted through vocal cords under direct vision,  positive ETCO2 and breath sounds checked- equal and bilateral Secured at: 22 cm Tube secured with: Tape Dental Injury: Teeth and Oropharynx as per pre-operative assessment

## 2018-04-29 NOTE — Progress Notes (Signed)
RN verified the presence of a signed informed consent that matches stated procedure by patient. Verified armband matches patient's stated name and birth date. Verified NPO status and that all jewelry, contact, glasses, dentures, and partials had been removed (if applicable). Patient placed on heart monitor in Short Stay 1st degree AV block rate 77, 131/46, 93% on 2 lpm oxygen. Alert and oriented x 4.

## 2018-04-29 NOTE — Brief Op Note (Signed)
04/09/2018 - 04/29/2018  9:40 PM  PATIENT:  Elizabeth [name]  67 y.o. female  PRE-OPERATIVE DIAGNOSIS:  AV Endocarditis With Severe AI Sever MR  POST-OPERATIVE DIAGNOSIS:  AV Endocarditis With Severe AI Sever MR  PROCEDURE:  Procedure(s):  AORTIC VALVE REPLACEMENT -19 mm Edwards Inspiris Resilia Tissue Valve  MITRAL VALVE REPAIR -Ring Annuloplasty with a 28 mm Memo 4D  SURGEON:  Surgeon(s) and Role:    * Bartle, Fernande Boyden, MD - Primary  PHYSICIAN ASSISTANT: Ellwood Handler PA-C  ASSISTANTS: Dineen Kid RNFA   ANESTHESIA:   general  EBL:  1045 mL   BLOOD ADMINISTERED: CELLSAVER  DRAINS: Mediastinal Chest drains   LOCAL MEDICATIONS USED:  NONE  SPECIMEN:  Source of Specimen:  Aortic Valve Vegetations, Leaflets  DISPOSITION OF SPECIMEN:  PATHOLOGY  COUNTS:  YES  TOURNIQUET:  * No tourniquets in log *  DICTATION: .Dragon Dictation  PLAN OF CARE: Admit to inpatient   PATIENT DISPOSITION:  ICU - intubated and hemodynamically stable.   Delay start of Pharmacological VTE agent (>24hrs) due to surgical blood loss or risk of bleeding: yes

## 2018-04-29 NOTE — Progress Notes (Signed)
[facility] PROGRESS NOTE  Patient: Elizabeth [name] TMB:[id number]   PCP: Ann Held, DO DOB: [birth year]   DOA: 04/09/2018   DOS: 04/29/2018    Subjective: Patient seen after the surgery.  Intubated.  Unable to follow any commands.  Objective:  Vitals:   04/29/18 0515 04/29/18 0600  BP: (!) 117/43 (!) 127/47  Pulse: 77 76  Resp: (!) 31 (!) 36  Temp:    SpO2: 99% 97%    S1-S2 present. Clear to auscultation.  Assessment and plan: Patient is intubated and currently in the ICU under cardiothoracic surgery. Management per cardiothoracic surgery team.   Author: Berle Mull, MD [facility] Pager: [phone] 04/29/2018 4:26 PM   If 7PM-7AM, please contact night-coverage at [email or web address], password [facility]

## 2018-04-29 NOTE — Transfer of Care (Signed)
Immediate Anesthesia Transfer of Care Note  Patient: Elizabeth [name]  Procedure(s) Performed: AORTIC VALVE REPLACEMENT (AVR) (N/A Chest) MITRAL VALVE (MV) REPLACEMENT (N/A Chest)  Patient Location: SICU  Anesthesia Type:General  Level of Consciousness: Patient remains intubated per anesthesia plan  Airway & Oxygen Therapy: Patient remains intubated per anesthesia plan  Post-op Assessment: Report given to RN and Post -op Vital signs reviewed and stable  Post vital signs: Reviewed and stable  Last Vitals:  Vitals Value Taken Time  BP 72/56 04/29/2018  4:00 PM  Temp 37.1 C 04/29/2018  4:12 PM  Pulse 80 04/29/2018  4:12 PM  Resp 22 04/29/2018  4:12 PM  SpO2 94 % 04/29/2018  4:12 PM  Vitals shown include unvalidated device data.  Last Pain:  Vitals:   04/29/18 0400  TempSrc: Axillary  PainSc: 0-No pain      Patients Stated Pain Goal: 0 (20/81/38 8719)  Complications: No apparent anesthesia complications

## 2018-04-29 NOTE — Progress Notes (Signed)
  Echocardiogram Echocardiogram Transesophageal has been performed.  Darlina Sicilian M 04/29/2018, 8:54 AM

## 2018-04-29 NOTE — Progress Notes (Signed)
RN gave report to CRNA.

## 2018-04-29 NOTE — Progress Notes (Signed)
Pt self extubated, RN made RT aware. Pt is on 4L [place], vitals signs are stable at this time. RT will continue to monitor as needed.

## 2018-04-29 NOTE — Anesthesia Preprocedure Evaluation (Addendum)
Anesthesia Evaluation  Patient identified by MRN, date of birth, ID band Patient awake    Reviewed: Allergy & Precautions, NPO status , Patient's Chart, lab work & pertinent test results  Airway Mallampati: II  TM Distance: <3 FB Neck ROM: Full    Dental  (+) Edentulous Upper, Poor Dentition, Chipped,    Pulmonary former smoker,     + decreased breath sounds      Cardiovascular hypertension, Pt. on medications + dysrhythmias  Rhythm:Regular Rate:Normal     Neuro/Psych Anxiety    GI/Hepatic negative GI ROS, Neg liver ROS,   Endo/Other    Renal/GU      Musculoskeletal  (+) Arthritis ,   Abdominal (+) + obese,   Peds  Hematology negative hematology ROS (+)   Anesthesia Other Findings - HLD  Reproductive/Obstetrics                            Lab Results  Component Value Date   WBC 21.0 (H) 04/29/2018   HGB 7.7 (L) 04/29/2018   HCT 26.2 (L) 04/29/2018   MCV 99.2 04/29/2018   PLT 227 04/29/2018   Lab Results  Component Value Date   CREATININE 0.93 04/29/2018   BUN 31 (H) 04/29/2018   NA 139 04/29/2018   K 3.2 (L) 04/29/2018   CL 96 (L) 04/29/2018   CO2 30 04/29/2018   Lab Results  Component Value Date   INR 0.94 12/09/2017   INR 1.0 10/16/2008   EKG:  sinus rhythm.  Cath: 1. 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 Elevated biventricular pressures 3. Cardiac output is normal however PA sat is quite low at 45% 4. 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 Patient developed atrial flutter in the 50s at the end of the case.  Echo: - Left ventricle: Wall thickness was increased in a pattern of mild   LVH. Left ventricular diastolic function parameters were normal. - Aortic valve: Nodular calcification of the leaflet tips with   severe AS. There was severe stenosis. There was mild   regurgitation. Valve area (VTI): 0.73 cm^2. Valve area  (Vmax):   0.69 cm^2. Valve area (Vmean): 0.74 cm^2. - Mitral valve: There was moderate regurgitation. - Left atrium: The atrium was moderately dilated. - Right atrium: The atrium was mildly dilated. - Tricuspid valve: There was moderate regurgitation. - Pulmonary arteries: PA peak pressure: 56 mm Hg (S).  Anesthesia Physical Anesthesia Plan  ASA: IV  Anesthesia Plan: General   Post-op Pain Management:    Induction: Intravenous  PONV Risk Score and Plan: 4 or greater and Ondansetron and Treatment may vary due to age or medical condition  Airway Management Planned: Oral ETT  Additional Equipment: Arterial line, CVP, PA Cath, TEE and Ultrasound Guidance Line Placement  Intra-op Plan:   Post-operative Plan: Post-operative intubation/ventilation  Informed Consent: I have reviewed the patients History and Physical, chart, labs and discussed the procedure including the risks, benefits and alternatives for the proposed anesthesia with the patient or authorized representative who has indicated his/her understanding and acceptance.   Dental advisory given  Plan Discussed with: CRNA  Anesthesia Plan Comments:        Anesthesia Quick Evaluation

## 2018-04-29 NOTE — Anesthesia Procedure Notes (Signed)
Central Venous Catheter Insertion Performed by: Effie Berkshire, MD, anesthesiologist Start/End12/28/2019 7:30 AM, 04/29/2018 7:40 AM Patient location: Pre-op. Preanesthetic checklist: patient identified, IV checked, site marked, risks and benefits discussed, surgical consent, monitors and equipment checked, pre-op evaluation, timeout performed and anesthesia consent Position: Trendelenburg Lidocaine 1% used for infiltration and patient sedated Hand hygiene performed , maximum sterile barriers used  and Seldinger technique used Catheter size: 9 Fr Total catheter length 10. Central line was placed.MAC introducer Swan type:thermodilution PA Cath depth:50 Procedure performed using ultrasound guided technique. Ultrasound Notes:anatomy identified, needle tip was noted to be adjacent to the nerve/plexus identified, no ultrasound evidence of intravascular and/or intraneural injection and image(s) printed for medical record Attempts: 1 Following insertion, line sutured and dressing applied. Post procedure assessment: blood return through all ports, free fluid flow and no air  Patient tolerated the procedure well with no immediate complications.

## 2018-04-29 NOTE — Op Note (Signed)
CARDIOVASCULAR SURGERY OPERATIVE NOTE  04/29/2018 Elizabeth [name] [id number]  Surgeon:  Gaye Pollack, MD  First Assistant: Ellwood Handler,  PA-C   Preoperative Diagnosis:   Aortic valve endocarditis with severe aortic insufficiency and severe mitral regurgitation.    Postoperative Diagnosis:  Same   Procedure:  1. Median Sternotomy 2. Extracorporeal circulation 3.   Aortic valve replacement using a 19 mm  Edwards INSPIRIS RESILIA pericardial valve. 4.   Mitral valve annuloplasty using a 28 mm Sorin Memo 4D ring.  Anesthesia:  General Endotracheal   Clinical History/Surgical Indication:  This 67 year old woman presented with a septic left hip with positive blood cultures growing staph Lugdenensis which is the same bacteria that grew from the incision and drainage of her left hip.  This is most likely related to a left hip steroid injection given in November 2019.  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  I do not see any evidence of perivalvular infection on her TEE from 04/17/2018 although she has had some heart block.  She also had severe mitral regurgitation with a structurally normal mitral valve and no evidence of vegetation.  I suspect that her mitral regurgitation is probably related to left ventricular distention and mitral annular dilation.  Cardiac catheterization today shows severe pulmonary hypertension likely related to the combination of severe AI and severe MR.  Coronary angiography shows a 50% ostial and proximal right coronary stenosis which I do not think is clinically significant at this time.  She has had recurrent shortness of breath and hypoxemia and a chest x-ray showing pulmonary edema.  She has improved with some diuresis this afternoon and her oxygen saturation now is 97% on 3 L.  She does have a few rales on exam.  She has required Neo-Synephrine to support her blood pressure and has a  low diastolic blood pressure.  Her mixed venous oxygen saturation was 47% at the time of catheterization but a repeat was 65%.  She does have some signs of cardiogenic shock and I think it would be best to proceed with aortic valve replacement and possible mitral valve repair. discussed the echo and catheterization findings with the patient and her daughter at the bedside. They understand the high risk nature of the surgery. I discussed the operative procedure with the patient and daughter including alternatives, benefits and risks; including but not limited to bleeding, blood transfusion, infection, stroke, myocardial infarction, graft failure, heart block requiring a permanent pacemaker, organ dysfunction, and death.  Elizabeth [name] understands and agrees to proceed.    Preparation:  The patient was seen in the preoperative holding area and the correct patient, correct operation were confirmed with the patient after reviewing the medical record and catheterization. The consent was signed by me. Preoperative antibiotics were given. A pulmonary arterial line and radial arterial line were placed by the anesthesia team. The patient was taken back to the operating room and positioned supine on the operating room table. After being placed under general endotracheal anesthesia by the anesthesia team a foley catheter was placed. The neck, chest, abdomen, and both legs were prepped with betadine soap and solution and draped in the usual sterile manner. A surgical time-out was taken and the correct patient and operative procedure were confirmed with the nursing and anesthesia staff.   Pre-bypass TEE:   Complete TEE assessment was performed by Dr. Suella Broad.   Result status: Final result    Left atrium: Normal  size, no evidence of LAA thrombus with PWD velocities > 40 mm/s in LAA  Left ventricle: Normal chamber size and systolic function, LVEF 18-56%, no significant WMAs noted. Mild concentric  LVH.  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 Severe valve calcification and thickening present. Severely decreased leaflet motion. Severe stenosis (Peak > 54mmhg, Mean > 30-19mmhg). Severe AI. Abscess vs false tract near the R coronary cusp with flow throughout cardiac cycle. Cannot rule out perivalvular pathology vs defect at proximal aspect of R coronary leaflet  Mitral valve: Moderate to severe regurgitation. Normal leaflet motion, unable to coapt likely due to dilation of the LV.  Right ventricle: Normal cavity size, wall thickness and ejection fraction. No thrombus present. No mass present.  Tricuspid valve: Mild to moderate regurgitation with central jet..  Pulmonic Valve: No insufficiency seen on color doppler.  Atrial Septum: no evidence of PFO or ASD on color flow doppler.  Aorta: Minimal calcification of the descending aorta and aortic arch, no evidence of dissection.  Pericardium: no significant pericardial effusion.       Post-bypass TEE:  Tricuspid, Pulmonic valve unchanged. Mitral valve repaired with 28 mm Memo 4D ring, trivial MR noted. Aortic valve replaced with 19 mm Edwards Inspiris Resilia Tissue Valve, no perivalvular leak noted. Valve appears to be well seated with no rocking noted. AV gradient normal. Former abscess cavity contains flow does not appear to traverse the new valve. RV function mildly reduced. LV function preserved. LV cavity and wall function unchanged  (CO>3.2, CI>1.7). No aortic dissection noted after cannula removed.    Elizabeth Sickles Smith Robert, MD, [facility] Anesthesiology   Cardiopulmonary Bypass:  A median sternotomy was performed. The pericardium was opened in the midline. Right ventricular function appeared normal. The ascending aorta was small and short and had no palpable plaque. There were no contraindications to aortic cannulation or cross-clamping. The patient was fully systemically heparinized and the ACT was  maintained > 400 sec. The proximal aortic arch was cannulated with a 42 F aortic cannula for arterial inflow. Bi-caval venous cannulation was performed via the SVC using a 24 F metal tip right angle venous cannula and the IVC using a 36 F plastic malleable cannula. An antegrade cardioplegia/vent cannula was inserted into the mid-ascending aorta. A left ventricular vent was placed via the right superior pulmonary vein. A retrograde cardioplegia cannnula was placed into the coronary sinus via the right atrium. Aortic occlusion was performed with a single cross-clamp. Systemic cooling to 32 degrees Centigrade and topical cooling of the heart with iced saline were used. Hyperkalemic antegrade cold blood cardioplegia was used to induce diastolic arrest but due to the AI there was low root pressure and then cold blood retrograde cardioplegia was given with good cooling of the heart to 10 degrees centigrade. Then additional doses of retrograde cardioplegia were given at about 20 minute intervals throughout the period of arrest to maintain myocardial temperature at or below 10 degrees centigrade. A temperature probe was inserted into the interventricular septum and an insulating pad was placed in the pericardium. Carbon dioxide was insufflated into the pericardium at 5L/min throughout the procedure to minimize intracardiac air.   Aortic Valve Replacement:   A transverse aortotomy was performed 1 cm above the take-off of the right coronary artery. The aorta was edematous. The native valve was destroyed by the endocarditis. There were three leaflets.  The ostia of the coronary arteries were in normal position and were not  obstructed but there was calcified plaque encircling both ostia. The native valve leaflets were excised along with the attached vegetation. Specimens were sent for culture. Care was taken to remove all particulate debris. The annular tissue appeared intact but edematous as was the aortic wall. There did  not appear to be any periannular abscess. The left ventricle was directly inspected for debris and then irrigated with ice saline solution. Then attention was turned to mitral valve annuloplasty and after that was completed the aortic valve procedure was completed.   The annulus was sized and a size 19 mm  Edwards INSPIRIS RESILIA  Pericardial  valve was chosen. The model number was [id number] and the serial number was [id number]. This is a small sized valve for this morbidly obese woman with a BSA of 2.2 but the largest size that would fit the small annulus. I did not think it wise to try to perform an annular enlarging procedure with the edema of the aortic wall and annulus as well as severe MR requiring repair with a ring. I also did not think aortic root replacement was indicated due to the increased complexity in this critically ill woman with calcification of her coronary ostia. 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 While the valve was being prepared 2-0 Ethibond pledgeted horizontal mattress sutures were placed around the annulus with the pledgets in a sub-annular position. The annular tissue appeared strong. The sutures were placed through the sewing ring and the valve lowered into place. The sutures were tied sequentially. The valve seated nicely and the coronary ostia were not obstructed. The prosthetic valve leaflets moved normally and there was no sub-valvular obstruction. The aortotomy was closed using 4-0 Prolene suture in 2 layers with felt strips to reinforce the closure.   Mitral Valve Repair:   The left atrium was opened through a vertical incision in the interatrial groove. Exposure was sub-optimal due to her morbid obesity and small atrium. Valve inspection showed normal leaflets with no sign of vegetation.  There was no prolapse and the regurgitation appeared to be related to a loss of coaptation from annular enlargement. A series of  pledgetted 2-0 Ethibond sutures were placed around the mitral annulus. The anterior leaflet was sized and a 28 mm Sorin 4D Memo annuloplasty ring was chosen ( [id number] [id number], [place] [id number]). The sutures were placed through the ring and it was lowered into place. The sutures were tied. The valve was tested with saline with complete distension of the LV and there was no regurgitation. The atrium was closed with 2 layers of continuous 3-0 prolene suture.     Completion:  The patient was rewarmed to 37 degrees Centigrade. De-airing maneuvers were performed and the head placed in trendelenburg position. The crossclamp was removed with a time of 220 minutes. There was spontaneous return of sinus bradycardia rhythm. The aortotomy was checked for hemostasis. Two temporary epicardial pacing wires were placed on the right atrium and two on the right ventricle. The left ventricular vent and retrograde cardioplegia cannulas were removed. The patient was weaned from CPB without difficulty on Milrinone and levophed.  CPB time was 253 minutes. Cardiac output was 4 LPM. Heparin was fully reversed with protamine and the aortic and venous cannulas removed. Hemostasis was achieved. Mediastinal and bilateral pleural drainage tubes were placed. The sternum was closed with double #6 stainless steel wires. The fascia was closed with continuous # 1 vicryl suture. The  subcutaneous tissue was closed with 2-0 vicryl continuous suture. The skin was closed with 3-0 vicryl subcuticular suture. All sponge, needle, and instrument counts were reported correct at the end of the case. Dry sterile dressings were placed over the incisions and around the chest tubes which were connected to pleurevac suction. The patient was then transported to the surgical intensive care unit in critical but stable condition.

## 2018-04-29 NOTE — Anesthesia Procedure Notes (Signed)
Arterial Line Insertion Start/End12/28/2019 7:30 AM, 04/29/2018 7:35 AM Performed by: CRNA  Patient location: Pre-op. Preanesthetic checklist: patient identified, IV checked, site marked, risks and benefits discussed, surgical consent, monitors and equipment checked, pre-op evaluation, timeout performed and anesthesia consent Lidocaine 1% used for infiltration Left, radial was placed Catheter size: 20 Fr Hand hygiene performed  and maximum sterile barriers used   Attempts: 1 Procedure performed without using ultrasound guided technique. Following insertion, dressing applied. Post procedure assessment: normal and unchanged

## 2018-04-29 NOTE — Progress Notes (Signed)
K 3.2 this AM on morning labs. Dr. Cyndia Bent notified. Orders for 3 runs of potassium. RN will follow orders and continue to monitor

## 2018-04-29 NOTE — Anesthesia Procedure Notes (Signed)
Central Venous Catheter Insertion Performed by: Effie Berkshire, MD, anesthesiologist Start/End12/28/2019 7:40 AM, 04/29/2018 7:45 AM Patient location: Pre-op. Preanesthetic checklist: patient identified, IV checked, site marked, risks and benefits discussed, surgical consent, monitors and equipment checked, pre-op evaluation, timeout performed and anesthesia consent Hand hygiene performed  and maximum sterile barriers used  PA cath was placed.Swan type:thermodilution Procedure performed without using ultrasound guided technique. Attempts: 1 Patient tolerated the procedure well with no immediate complications.

## 2018-04-30 ENCOUNTER — Inpatient Hospital Stay (HOSPITAL_COMMUNITY): Payer: Medicare Other

## 2018-04-30 LAB — BPAM PLATELET PHERESIS
BLOOD PRODUCT EXPIRATION DATE: 201912282359
ISSUE DATE / TIME: 201912281414
Unit Type and Rh: 2800

## 2018-04-30 LAB — GLUCOSE, CAPILLARY
Glucose-Capillary: 100 mg/dL — ABNORMAL HIGH (ref 70–99)
Glucose-Capillary: 103 mg/dL — ABNORMAL HIGH (ref 70–99)
Glucose-Capillary: 103 mg/dL — ABNORMAL HIGH (ref 70–99)
Glucose-Capillary: 105 mg/dL — ABNORMAL HIGH (ref 70–99)
Glucose-Capillary: 105 mg/dL — ABNORMAL HIGH (ref 70–99)
Glucose-Capillary: 107 mg/dL — ABNORMAL HIGH (ref 70–99)
Glucose-Capillary: 110 mg/dL — ABNORMAL HIGH (ref 70–99)
Glucose-Capillary: 110 mg/dL — ABNORMAL HIGH (ref 70–99)
Glucose-Capillary: 113 mg/dL — ABNORMAL HIGH (ref 70–99)
Glucose-Capillary: 117 mg/dL — ABNORMAL HIGH (ref 70–99)
Glucose-Capillary: 132 mg/dL — ABNORMAL HIGH (ref 70–99)
Glucose-Capillary: 154 mg/dL — ABNORMAL HIGH (ref 70–99)

## 2018-04-30 LAB — TYPE AND SCREEN
ABO/RH(D): O POS
Antibody Screen: NEGATIVE
Unit division: 0
Unit division: 0
Unit division: 0
Unit division: 0

## 2018-04-30 LAB — BPAM RBC
Blood Product Expiration Date: 202001172359
Blood Product Expiration Date: 202001182359
Blood Product Expiration Date: 202001182359
Blood Product Expiration Date: 202001182359
ISSUE DATE / TIME: 201912280850
ISSUE DATE / TIME: 201912280850
ISSUE DATE / TIME: 201912280850
ISSUE DATE / TIME: 201912280850
Unit Type and Rh: 5100
Unit Type and Rh: 5100
Unit Type and Rh: 5100
Unit Type and Rh: 5100

## 2018-04-30 LAB — PREPARE PLATELET PHERESIS: Unit division: 0

## 2018-04-30 LAB — POCT I-STAT, CHEM 8
BUN: 24 mg/dL — ABNORMAL HIGH (ref 8–23)
Calcium, Ion: 1.05 mmol/L — ABNORMAL LOW (ref 1.15–1.40)
Chloride: 98 mmol/L (ref 98–111)
Creatinine, Ser: 0.8 mg/dL (ref 0.44–1.00)
Glucose, Bld: 115 mg/dL — ABNORMAL HIGH (ref 70–99)
HCT: 28 % — ABNORMAL LOW (ref 36.0–46.0)
Hemoglobin: 9.5 g/dL — ABNORMAL LOW (ref 12.0–15.0)
Potassium: 3.1 mmol/L — ABNORMAL LOW (ref 3.5–5.1)
Sodium: 136 mmol/L (ref 135–145)
TCO2: 27 mmol/L (ref 22–32)

## 2018-04-30 LAB — POCT I-STAT 3, ART BLOOD GAS (G3+)
Acid-Base Excess: 2 mmol/L (ref 0.0–2.0)
Acid-Base Excess: 3 mmol/L — ABNORMAL HIGH (ref 0.0–2.0)
Bicarbonate: 26 mmol/L (ref 20.0–28.0)
Bicarbonate: 28.4 mmol/L — ABNORMAL HIGH (ref 20.0–28.0)
O2 Saturation: 100 %
O2 Saturation: 98 %
PCO2 ART: 46.8 mmHg (ref 32.0–48.0)
Patient temperature: 38.4
Patient temperature: 38.5
TCO2: 27 mmol/L (ref 22–32)
TCO2: 30 mmol/L (ref 22–32)
pCO2 arterial: 39 mmHg (ref 32.0–48.0)
pH, Arterial: 7.397 (ref 7.350–7.450)
pH, Arterial: 7.438 (ref 7.350–7.450)
pO2, Arterial: 102 mmHg (ref 83.0–108.0)
pO2, Arterial: 336 mmHg — ABNORMAL HIGH (ref 83.0–108.0)

## 2018-04-30 LAB — CBC
HCT: 30.2 % — ABNORMAL LOW (ref 36.0–46.0)
HCT: 27.5 % — ABNORMAL LOW (ref 36.0–46.0)
Hemoglobin: 9.7 g/dL — ABNORMAL LOW (ref 12.0–15.0)
Hemoglobin: 8.9 g/dL — ABNORMAL LOW (ref 12.0–15.0)
MCH: 29.2 pg (ref 26.0–34.0)
MCH: 30 pg (ref 26.0–34.0)
MCHC: 32.1 g/dL (ref 30.0–36.0)
MCHC: 32.4 g/dL (ref 30.0–36.0)
MCV: 91 fL (ref 80.0–100.0)
MCV: 92.6 fL (ref 80.0–100.0)
Platelets: 94 10*3/uL — ABNORMAL LOW (ref 150–400)
Platelets: 62 10*3/uL — ABNORMAL LOW (ref 150–400)
RBC: 3.32 MIL/uL — ABNORMAL LOW (ref 3.87–5.11)
RBC: 2.97 MIL/uL — ABNORMAL LOW (ref 3.87–5.11)
RDW: 17.2 % — ABNORMAL HIGH (ref 11.5–15.5)
RDW: 17.3 % — AB (ref 11.5–15.5)
WBC: 15.3 10*3/uL — ABNORMAL HIGH (ref 4.0–10.5)
WBC: 10.4 10*3/uL (ref 4.0–10.5)
nRBC: 1.4 % — ABNORMAL HIGH (ref 0.0–0.2)
nRBC: 0.4 % — ABNORMAL HIGH (ref 0.0–0.2)

## 2018-04-30 LAB — BASIC METABOLIC PANEL
Anion gap: 10 (ref 5–15)
BUN: 27 mg/dL — ABNORMAL HIGH (ref 8–23)
CO2: 25 mmol/L (ref 22–32)
Calcium: 7.3 mg/dL — ABNORMAL LOW (ref 8.9–10.3)
Chloride: 102 mmol/L (ref 98–111)
Creatinine, Ser: 1.01 mg/dL — ABNORMAL HIGH (ref 0.44–1.00)
GFR calc Af Amer: >60 mL/min (ref >60)
GFR, EST NON AFRICAN AMERICAN: 57 mL/min — AB (ref >60)
Glucose, Bld: 112 mg/dL — ABNORMAL HIGH (ref 70–99)
Potassium: 3.8 mmol/L (ref 3.5–5.1)
Sodium: 137 mmol/L (ref 135–145)

## 2018-04-30 LAB — CREATININE, SERUM
Creatinine, Ser: 0.87 mg/dL (ref 0.44–1.00)
GFR calc Af Amer: >60 mL/min (ref >60)
GFR calc non Af Amer: >60 mL/min (ref >60)

## 2018-04-30 LAB — COOXEMETRY PANEL
Carboxyhemoglobin: 2.5 % — ABNORMAL HIGH (ref 0.5–1.5)
METHEMOGLOBIN: 1.2 % (ref 0.0–1.5)
O2 Saturation: 81.7 %
Total hemoglobin: 9.2 g/dL — ABNORMAL LOW (ref 12.0–16.0)

## 2018-04-30 LAB — MAGNESIUM
Magnesium: 2.8 mg/dL — ABNORMAL HIGH (ref 1.7–2.4)
Magnesium: 2.2 mg/dL (ref 1.7–2.4)

## 2018-04-30 MED ORDER — AMIODARONE HCL IN DEXTROSE 360-4.14 MG/200ML-% IV SOLN
60.0000 mg/h | INTRAVENOUS | Status: AC
  Administered 2018-04-30 (×2): 60 mg/h via INTRAVENOUS
  Filled 2018-04-30 (×2): qty 200

## 2018-04-30 MED ORDER — WARFARIN SODIUM 2.5 MG PO TABS
2.5000 mg | ORAL_TABLET | Freq: Every day | ORAL | Status: DC
  Administered 2018-04-30 – 2018-05-01 (×2): 2.5 mg via ORAL
  Filled 2018-04-30 (×2): qty 1

## 2018-04-30 MED ORDER — CEFAZOLIN SODIUM-DEXTROSE 2-4 GM/100ML-% IV SOLN
2.0000 g | Freq: Three times a day (TID) | INTRAVENOUS | Status: DC
  Administered 2018-04-30 – 2018-05-11 (×33): 2 g via INTRAVENOUS
  Filled 2018-04-30 (×40): qty 100

## 2018-04-30 MED ORDER — GUAIFENESIN ER 600 MG PO TB12
600.0000 mg | ORAL_TABLET | Freq: Two times a day (BID) | ORAL | Status: DC
  Administered 2018-04-30 – 2018-05-11 (×22): 600 mg via ORAL
  Filled 2018-04-30 (×22): qty 1

## 2018-04-30 MED ORDER — NOREPINEPHRINE BITARTRATE 1 MG/ML IV SOLN
0.0000 ug/min | INTRAVENOUS | Status: DC
  Administered 2018-04-30: 9 ug/min via INTRAVENOUS
  Administered 2018-05-01: 3 ug/min via INTRAVENOUS
  Filled 2018-04-30 (×2): qty 4

## 2018-04-30 MED ORDER — TRAMADOL HCL 50 MG PO TABS
50.0000 mg | ORAL_TABLET | ORAL | Status: DC | PRN
  Administered 2018-05-02 – 2018-05-09 (×8): 50 mg via ORAL
  Filled 2018-04-30 (×8): qty 1

## 2018-04-30 MED ORDER — ORAL CARE MOUTH RINSE
15.0000 mL | Freq: Two times a day (BID) | OROMUCOSAL | Status: DC
  Administered 2018-04-30 (×2): 15 mL via OROMUCOSAL

## 2018-04-30 MED ORDER — FUROSEMIDE 10 MG/ML IJ SOLN
40.0000 mg | Freq: Once | INTRAMUSCULAR | Status: AC
  Administered 2018-04-30: 40 mg via INTRAVENOUS
  Filled 2018-04-30: qty 4

## 2018-04-30 MED ORDER — AMIODARONE HCL IN DEXTROSE 360-4.14 MG/200ML-% IV SOLN
30.0000 mg/h | INTRAVENOUS | Status: DC
  Administered 2018-04-30 – 2018-05-03 (×6): 30 mg/h via INTRAVENOUS
  Filled 2018-04-30 (×5): qty 200

## 2018-04-30 MED ORDER — ASPIRIN 81 MG PO CHEW: 81.0000 mg | CHEWABLE_TABLET | Freq: Every day | ORAL | Status: DC

## 2018-04-30 MED ORDER — POTASSIUM CHLORIDE 10 MEQ/50ML IV SOLN
10.0000 meq | INTRAVENOUS | Status: AC
  Administered 2018-04-30 (×3): 10 meq via INTRAVENOUS
  Filled 2018-04-30 (×2): qty 50

## 2018-04-30 MED ORDER — ASPIRIN EC 81 MG PO TBEC
81.0000 mg | DELAYED_RELEASE_TABLET | Freq: Every day | ORAL | Status: DC
  Administered 2018-05-01: 81 mg via ORAL
  Filled 2018-04-30: qty 1

## 2018-04-30 MED ORDER — MILRINONE LACTATE IN DEXTROSE 20-5 MG/100ML-% IV SOLN
0.1000 ug/kg/min | INTRAVENOUS | Status: DC
  Administered 2018-04-29 – 2018-05-01 (×4): 0.2 ug/kg/min via INTRAVENOUS
  Administered 2018-05-02: 0.1 ug/kg/min via INTRAVENOUS
  Filled 2018-04-30 (×5): qty 100

## 2018-04-30 MED ORDER — CHLORHEXIDINE GLUCONATE 0.12 % MT SOLN
15.0000 mL | Freq: Two times a day (BID) | OROMUCOSAL | Status: DC
  Administered 2018-04-30: 15 mL via OROMUCOSAL
  Filled 2018-04-30: qty 15

## 2018-04-30 MED ORDER — INSULIN ASPART 100 UNIT/ML ~~LOC~~ SOLN
0.0000 [IU] | SUBCUTANEOUS | Status: DC
  Administered 2018-04-30: 3 [IU] via SUBCUTANEOUS
  Administered 2018-04-30 – 2018-05-01 (×2): 2 [IU] via SUBCUTANEOUS

## 2018-04-30 MED ORDER — AMIODARONE LOAD VIA INFUSION
150.0000 mg | Freq: Once | INTRAVENOUS | Status: AC
  Administered 2018-04-30: 150 mg via INTRAVENOUS
  Filled 2018-04-30: qty 83.34

## 2018-04-30 MED ORDER — WARFARIN - PHYSICIAN DOSING INPATIENT
Freq: Every day | Status: DC
  Administered 2018-05-04 – 2018-05-07 (×3)

## 2018-04-30 NOTE — Progress Notes (Signed)
Progress Note  Patient Name: Elizabeth [name] Date of Encounter: 04/30/2018  Primary Cardiologist:   Skeet Latch, MD   Subjective   She has some post op pain but otherwise looks good.  No SOB.   Inpatient Medications    Scheduled Meds: . acetaminophen  1,000 mg Oral Q6H   Or  . acetaminophen (TYLENOL) oral liquid 160 mg/5 mL  1,000 mg Per Tube Q6H  . aspirin EC  325 mg Oral Daily   Or  . aspirin  324 mg Per Tube Daily  . bisacodyl  10 mg Oral Daily   Or  . bisacodyl  10 mg Rectal Daily  . chlorhexidine gluconate (MEDLINE KIT)  15 mL Mouth Rinse BID  . docusate sodium  200 mg Oral Daily  . insulin aspart  0-15 Units Subcutaneous Q4H  . mouth rinse  15 mL Mouth Rinse 10 times per day  . [START ON 05/01/2018] pantoprazole  40 mg Oral Daily  . sodium chloride flush  3 mL Intravenous Q12H   Continuous Infusions: . sodium chloride 20 mL/hr at 04/30/18 0900  . sodium chloride    . sodium chloride 20 mL/hr (04/29/18 1650)  .  ceFAZolin (ANCEF) IV Stopped (04/30/18 0150)  . lactated ringers    . lactated ringers 20 mL/hr at 04/30/18 0900  . levofloxacin (LEVAQUIN) IV 750 mg (04/30/18 0908)  . milrinone 0.2 mcg/kg/min (04/30/18 0900)  . nitroGLYCERIN    . norepinephrine (LEVOPHED) Adult infusion 9 mcg/min (04/30/18 0655)  . norepinephrine (LEVOPHED) Adult infusion 7 mcg/min (04/30/18 0900)   PRN Meds: sodium chloride, ALPRAZolam, morphine injection, ondansetron (ZOFRAN) IV, oxyCODONE, sodium chloride flush, traMADol   Vital Signs    Vitals:   04/30/18 0600 04/30/18 0700 04/30/18 0800 04/30/18 0900  BP:  118/64 (!) 118/57 (!) 121/57  Pulse: 89 80 90 91  Resp: (!) 28 (!) 25 (!) 28 (!) 23  Temp:  98.8 F (37.1 C) 98.6 F (37 C) 98.6 F (37 C)  TempSrc:      SpO2: 95% 96% 97% 100%  Weight: 119.3 kg     Height:        Intake/Output Summary (Last 24 hours) at 04/30/2018 0945 Last data filed at 04/30/2018 0900 Gross per 24 hour  Intake 6841.47 ml  Output 4100  ml  Net 2741.47 ml   Filed Weights   04/28/18 1830 04/29/18 0500 04/30/18 0600  Weight: 114.5 kg 113.3 kg 119.3 kg    Telemetry    AV paced - Personally Reviewed  ECG    NA - Personally Reviewed  Physical Exam   GEN: No acute distress.   Neck: No  JVD Cardiac: RRR, soft apical systolic murmur, no diastolic murmurs, rubs, or gallops.  Respiratory: Decreased breath sounds GI: Soft, nontender, non-distended  MS:    Diffuse mild edema; No deformity. Neuro:  Nonfocal  Psych: Normal affect   Labs    Chemistry Recent Labs  Lab 04/26/18 0430  04/28/18 0355 04/29/18 0350  04/29/18 1425 04/29/18 1601 04/29/18 2220 04/29/18 2239 04/30/18 0334  NA 142   < > 139 139   < > 139 139 138  --  137  K 3.7   < > 4.8 3.2*   < > 4.4 3.6 4.1  --  3.8  CL 97*   < > 95* 96*   < > 100  --  100  --  102  CO2 37*   < > 32 30  --   --   --   --   --  25  GLUCOSE 110*   < > 107* 88   < > 161* 131* 147*  --  112*  BUN 33*   < > 34* 31*   < > 28*  --  28*  --  27*  CREATININE 0.81   < > 1.32* 0.93   < > 0.70  --  0.80 0.89 1.01*  CALCIUM 7.9*   < > 8.0* 7.5*  --   --   --   --   --  7.3*  PROT 5.8*  --   --   --   --   --   --   --   --   --   ALBUMIN 2.5*  --   --   --   --   --   --   --   --   --   AST 16  --   --   --   --   --   --   --   --   --   ALT 8  --   --   --   --   --   --   --   --   --   ALKPHOS 41  --   --   --   --   --   --   --   --   --   BILITOT 1.3*  --   --   --   --   --   --   --   --   --   GFRNONAA >60   < > 41* >60  --   --   --   --  >60 57*  GFRAA >60   < > 48* >60  --   --   --   --  >60 >60  ANIONGAP 8   < > 12 13  --   --   --   --   --  10   < > = values in this interval not displayed.     Hematology Recent Labs  Lab 04/29/18 1605 04/29/18 2220 04/29/18 2239 04/30/18 0334  WBC 22.9*  --  17.2* 15.3*  RBC 3.79*  --  3.58* 3.32*  HGB 11.0* 10.9* 10.3* 9.7*  HCT 35.0* 32.0* 32.6* 30.2*  MCV 92.3  --  91.1 91.0  MCH 29.0  --  28.8 29.2  MCHC  31.4  --  31.6 32.1  RDW 16.5*  --  17.1* 17.2*  PLT 96*  --  93* 94*    Cardiac EnzymesNo results for input(s): TROPONINI in the last 168 hours. No results for input(s): TROPIPOC in the last 168 hours.   BNPNo results for input(s): BNP, PROBNP in the last 168 hours.   DDimer No results for input(s): DDIMER in the last 168 hours.   Radiology    Dg Abd 1 View  Result Date: 04/28/2018 CLINICAL DATA:  Acute onset of LOWER abdominal pain and pelvic pain which improved after a bowel movement. EXAM: ABDOMEN - 1 VIEW COMPARISON:  04/24/2018. FINDINGS: Near complete resolution of the previously identified small bowel ileus since the examination 4 days ago. Only 2 or 3 residual mildly distended loops of small bowel persist in the LEFT UPPER QUADRANT. Gas and stool throughout decompressed colon. No suggestion of free air on the supine image. Surgical clips in the RIGHT UPPER QUADRANT from prior cholecystectomy. Degenerative changes involving the lumbar spine and both hips. IMPRESSION: Near complete resolution of small  bowel ileus since the examination 4 days ago. Electronically Signed   By: Evangeline Dakin M.D.   On: 04/28/2018 13:42   Dg Chest Port 1 View  Result Date: 04/30/2018 CLINICAL DATA:  67 year old female with history of aortic valve replacement and mitral valve repair. Followup study. EXAM: PORTABLE CHEST 1 VIEW COMPARISON:  Chest x-ray 04/29/2018. FINDINGS: Patient has been extubated. Previously noted right upper extremity PICC, Swan-Ganz catheter, chest tubes and mediastinal drains appear in similar position to the prior study. Lung volumes remain slightly low. Patchy multifocal interstitial and airspace disease noted throughout the lungs bilaterally, with slight improvement in aeration compared to yesterday's examination. Small bilateral pleural effusions. No appreciable pneumothorax. Heart size is mildly enlarged. Upper mediastinal contours are widened, within normal limits for a  postoperative patient. IMPRESSION: 1. Stable support apparatus and postoperative changes. Overall appearance the chest is very similar to the prior study, with slight improved aeration, favored to reflect slightly improved pulmonary edema. Electronically Signed   By: Vinnie Langton M.D.   On: 04/30/2018 09:07   Dg Chest Port 1 View  Result Date: 04/29/2018 CLINICAL DATA:  67 year old female with history of aortic valve and mitral valve replacement. EXAM: PORTABLE CHEST 1 VIEW COMPARISON:  Chest x-ray 04/29/2018. FINDINGS: An endotracheal tube is in place with tip 3.8 cm above the carina. There is a right upper extremity PICC with tip terminating in the superior cavoatrial junction. Right IJ Cordis through which a Swan-Ganz catheter has been passed into the proximal right main pulmonary artery. Nasogastric tube extending into the upper abdomen. Bilateral chest tubes in place. Mediastinal drain projecting over the mid to lower thorax. Lung volumes are low. Diffuse interstitial and airspace opacities throughout the lungs bilaterally, favored to reflect underlying pulmonary edema. No appreciable pneumothorax. Trace amount of pleural fluid bilaterally. Heart size is mildly enlarged. Upper mediastinal contours are widened. New median sternotomy wires. Bioprosthetic aortic valve. Postoperative changes of mitral annuloplasty. IMPRESSION: 1. Postoperative changes and support apparatus, as above. 2. The appearance of the lungs suggests pulmonary edema, as above. 3. Trace bilateral pleural effusions. Electronically Signed   By: Vinnie Langton M.D.   On: 04/29/2018 16:18   Dg Chest Port 1 View  Result Date: 04/29/2018 CLINICAL DATA:  Pulmonary edema.  Endocarditis. EXAM: PORTABLE CHEST 1 VIEW COMPARISON:  Yesterday FINDINGS: Right upper extremity PICC with tip at the upper cavoatrial junction. Bilateral airspace disease is improved but there is still extensive interstitial and airspace opacity. This could be edema  and/or infection given the clinical setting. Postoperative right hilum. Chronic cardiomegaly. Right costophrenic sulcus blunting which may be scarring based on September 2019 CT. IMPRESSION: 1. Mild improvement in airspace disease. 2. Stable cardiomegaly. Electronically Signed   By: Monte Fantasia M.D.   On: 04/29/2018 08:21   Dg Chest Port 1 View  Result Date: 04/28/2018 CLINICAL DATA:  History of pulmonary edema, follow-up EXAM: PORTABLE CHEST 1 VIEW COMPARISON:  Portable chest x-ray of 04/26/2017 FINDINGS: The endotracheal tube has been removed. Aeration has improved somewhat. However there are patchy areas of airspace disease bilaterally most consistent with either multifocal pneumonia or residual edema. There may be a small right effusion present. Mild cardiomegaly is stable. No bony abnormality is seen. IMPRESSION: 1. Somewhat better aeration, but there are still patchy areas of airspace disease bilaterally. These may represent multifocal pneumonia or less likely residual edema. 2. Cardiomegaly and small right pleural effusion. Electronically Signed   By: Ivar Drape M.D.   On: 04/28/2018 15:34  Cardiac Studies   04/29/18  Result status: Final result    Left atrium: Normal size, no evidence of LAA thrombus with PWD velocities > 40 mm/s in LAA  Left ventricle: Normal chamber size and systolic function, LVEF 16-01%, no significant WMAs noted. Mild concentric LVH.  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 Severe valve calcification and thickening present. Severely decreased leaflet motion. Severe stenosis (Peak > 40mhg, Mean > 30-413mg). Severe AI. Abscess vs false tract near the R coronary cusp with flow throughout cardiac cycle. Cannot rule out perivalvular pathology vs defect at proximal aspect of R coronary leaflet  Mitral valve: Moderate to severe regurgitation. Normal leaflet motion, unable to coapt likely due to dilation of the LV.  Right  ventricle: Normal cavity size, wall thickness and ejection fraction. No thrombus present. No mass present.  Tricuspid valve: Mild to moderate regurgitation with central jet..  Pulmonic Valve: No insufficiency seen on color doppler.  Atrial Septum: no evidence of PFO or ASD on color flow doppler.  Aorta: Minimal calcification of the descending aorta and aortic arch, no evidence of dissection.  Pericardium: no significant pericardial effusion.   Post Bypass:  Tricuspid, Pulmonic valve unchanged. Mitral valve repaired with 28 mm Memo 4D ring, trivial MR noted. Aortic valve replaced with 19 mm Edwards Inspiris Resilia Tissue Valve, no perivalvular leak noted. Valve appears to be well seated with no rocking noted. AV gradient normal. Former abscess cavity contains flow does not appear to traverse the new valve. RV function mildly reduced. LV function preserved. LV cavity and wall function unchanged  (CO>3.2, CI>1.7). No aortic dissection noted after cannula removed.       Patient Profile     [age].o. 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  Now status post AVReplacment/MVRepair.    Assessment & Plan    AVReplacement (tissue valve) /MEli Hoseor SBE.  Staph species.  Day 2 of antibiotics by ID count.  Continue current therapy and rehab  TACHYBRADY:  SVT.  AV paced rhythm.  No change in therapy.   ACUTE ON CHRONIC DIASTOLIC HF: Oxygenating well.  Will start to mobilize fluid and likely need diuretic in the days to come.    CAD: Non obstructive.  Medical management.   For questions or updates, please contact [facility] consult [email or web address] for contact info under Cardiology/STEMI.   Signed, JaMinus BreedingMD  04/30/2018, 9:45 AM

## 2018-04-30 NOTE — Progress Notes (Signed)
PROGRESS NOTE    Elizabeth [name]  WUJ:[id number] DOB: 01-01-1951 DOA: 04/09/2018 PCP: Ann Held, DO   Brief Narrative: Patient is a 67 year old female with history of hypertension, osteoarthritis who was admitted on 12/8 when she presented with complaints of left hip pain, fever.  She was hypotensive on presentation.  Septic arthritis was suspected.  She was admitted under pulmonary critical care service for septic shock.Apparently she had received a steroid injection on her left hip in November after which she developed severe left hip pain and fever.  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  She was being managed with IV antibiotics and also had left hip I&D.  On 12/23 she went into acute respiratory distress with symptomatic bradycardia and was intubated for respiratory distress.  She was weaned off ventilator and extubated on 12/25.  Patient was being followed by cardiology and infectious disease.  She underwent aortic valve replacement , mitral valve annuloplasty through midline sternotomy by CT surgery on 04/29/2018 for aortic valve endocarditis.  She was intubated during the procedure but self extubated herself last night.  Currently she is hemodynamically stable.  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  Sepsis/septic arthritis/endocarditis: ID following.  Continue current antibiotics,Ancef  Follow-up aerobic/anaerobic cultures.  ID considering 12/28 being day 1 of antibiotic and restarted treatment for AV endocarditis. Ortho was also following earlier.  Acute respiratory distress secondary to acute pulmonary edema: Currently her respiratory status stable.  Off vent.  Saturating fine on supplemental oxygen.  She was also on CPAP at bedtime.  Heart failure: Underwent right and left heart catheterization showing mild to moderate nonobstructive coronary disease involving 50% of the proximal RCA.  Heart failure team following.  She had acute diastolic heart failure with cardiogenic shock, tachy/brady syndrome during cardiac cath and paroxysmal atrial fibrillation.  She was treated for supraventricular tachycardia with amiodarone but had to be stopped in the setting of severe bradycardia and first-degree AV block . cardiology following Echocardiogram on 04/12/2017 showed ejection fraction of 60-65%, no wall motion abnormalities.  Patient also underwent TEE.   Current SVT: Currently she is on sinus rhythm.  Acute kidney injury: Had issues with volume overload and was started on Lasix. Lasix stopped. Continue to monitor Renal function .  Anemia: Currently H&H stable.  We will continue to monitor the trend.  Mild leukocytosis: Secondary to sepsis, reactive from surgery.  Continue to monitor the trend.  Currently she is afebrile.  Nutrition Problem: Increased nutrient  needs Etiology: acute illness, wound healing      DVT prophylaxis:SCD Code Status: Full Family Communication: Daughter present at the bedside Disposition Plan: Undetermined at this point.  Anticipating several days of hospitalization before discharge.   Consultants: Cardiothoracic surgery, cardiology, ID, orthopedics  Procedures: Aortic valve replacement, mitral valve repair  Antimicrobials: Ancef  Subjective: Patient seen and examined the bedside this morning in ICU.  She was reported to have self extubated herself last night.  Currently she is hemodynamically stable.  Alert and oriented.  Denies any chest pain or shortness of breath.  Objective: Vitals:   04/30/18 0400 04/30/18 0500 04/30/18 0600 04/30/18 0700  BP: (!) 92/48 (!) 101/44  118/64  Pulse: 92 80 89 80  Resp: (!) 26 (!) 24 (!) 28 (!) 25  Temp: 99.9 F (37.7 C) 99.3 F (37.4 C)  98.8 F (37.1 C)  TempSrc:      SpO2: 97% 98% 95% 96%  Weight:   119.3 kg   Height:        Intake/Output Summary (Last 24 hours) at 04/30/2018 0811 Last data filed at 04/30/2018 0800 Gross per 24 hour  Intake 6598.4 ml  Output 4175 ml  Net 2423.4 ml   Filed Weights   04/28/18 1830 04/29/18 0500 04/30/18 0600  Weight: 114.5 kg 113.3 kg 119.3 kg    Examination:  General exam: Not in distress,average built HEENT:PERRL,Oral mucosa moist, Ear/Nose normal on gross exam, central line on the right neck Respiratory system: Bilateral decreased air entry on the bases Cardiovascular system: S1 & S2 heard, RRR. No JVD, murmurs, rubs, gallops or clicks. No pedal edema. Gastrointestinal system: Abdomen is nondistended, soft and nontender. No organomegaly or masses felt. Normal bowel sounds heard.  Upper abdominal drains from CT surgery Central nervous system: Alert and oriented. No focal neurological deficits. Extremities: Trace edema, no clubbing ,no cyanosis, distal peripheral pulses palpable.  PICC line in the right arm Skin: No  rashes, lesions or ulcers,no icterus ,no pallor      Data Reviewed: I have personally reviewed following labs and imaging studies  CBC: Recent Labs  Lab 04/28/18 0355 04/29/18 0350  04/29/18 1245  04/29/18 1601 04/29/18 1605 04/29/18 2220 04/29/18 2239 04/30/18 0334  WBC 15.8* 21.0*  --   --   --   --  22.9*  --  17.2* 15.3*  HGB 8.1* 7.7*   < > 7.7*   < > 10.9* 11.0* 10.9* 10.3* 9.7*  HCT 26.1* 26.2*   < > 24.0*   < > 32.0* 35.0* 32.0* 32.6* 30.2*  MCV 99.2 99.2  --   --   --   --  92.3  --  91.1 91.0  PLT 198 227  --  90*  --   --  96*  --  93* 94*   < > = values in this interval not displayed.   Basic Metabolic Panel: Recent Labs  Lab 04/24/18 1103  04/25/18 0411 04/25/18 1625 04/26/18 0430 04/27/18 0516 04/28/18 0355 04/29/18 0350  04/29/18 1224 04/29/18 1247 04/29/18 1425 04/29/18 1601 04/29/18 2220 04/29/18 2239 04/30/18 0334  NA  --    < > 140  --  142 138 139 139   < > 138 137 139 139 138  --  137  K  --    < > 3.2*  --  3.7 4.2 4.8 3.2*   < > 3.6 4.0  4.4 3.6 4.1  --  3.8  CL  --    < > 95*  --  97* 95* 95* 96*   < > 97* 98 100  --  100  --  102  CO2  --    < > 36*  --  37* 34* 32 30  --   --   --   --   --   --   --  25  GLUCOSE  --    < > 106*  --  110* 109* 107* 88   < > 117* 119* 161* 131* 147*  --  112*  BUN  --    < > 27*  --  33* 29* 34* 31*   < > 29* 29* 28*  --  28*  --  27*  CREATININE  --    < > 0.80  --  0.81 1.00 1.32* 0.93   < > 0.80 0.80 0.70  --  0.80 0.89 1.01*  CALCIUM  --    < > 8.0*  --  7.9* 8.0* 8.0* 7.5*  --   --   --   --   --   --   --  7.3*  MG 2.0  --  1.8 2.3  --  2.3  --   --   --   --   --   --   --   --  2.9* 2.8*  PHOS 4.0  --  4.0 3.5  --  3.7  --   --   --   --   --   --   --   --   --   --    < > = values in this interval not displayed.   GFR: Estimated Creatinine Clearance: 67.7 mL/min (A) (by C-G formula based on SCr of 1.01 mg/dL (H)). Liver Function Tests: Recent Labs  Lab 04/26/18 0430  AST 16  ALT 8    ALKPHOS 41  BILITOT 1.3*  PROT 5.8*  ALBUMIN 2.5*   No results for input(s): LIPASE, AMYLASE in the last 168 hours. No results for input(s): AMMONIA in the last 168 hours. Coagulation Profile: Recent Labs  Lab 04/29/18 1605  INR 2.15   Cardiac Enzymes: No results for input(s): CKTOTAL, CKMB, CKMBINDEX, TROPONINI in the last 168 hours. BNP (last 3 results) No results for input(s): PROBNP in the last 8760 hours. HbA1C: No results for input(s): HGBA1C in the last 72 hours. CBG: Recent Labs  Lab 04/30/18 0209 04/30/18 0327 04/30/18 0433 04/30/18 0542 04/30/18 0645  GLUCAP 100* 110* 107* 103* 117*   Lipid Profile: No results for input(s): CHOL, HDL, LDLCALC, TRIG, CHOLHDL, LDLDIRECT in the last 72 hours. Thyroid Function Tests: No results for input(s): TSH, T4TOTAL, FREET4, T3FREE, THYROIDAB in the last 72 hours. Anemia Panel: No results for input(s): VITAMINB12, FOLATE, FERRITIN, TIBC, IRON, RETICCTPCT in the last 72 hours. Sepsis Labs: No results for input(s): PROCALCITON, LATICACIDVEN in the last 168 hours.  Recent Results (from the past 240 hour(s))  Aerobic/Anaerobic Culture (surgical/deep wound)     Status: None (Preliminary result)   Collection Time: 04/29/18 10:09 AM  Result Value Ref Range Status   Specimen Description TISSUE AORTA VEGETATION  Final   Special Requests NONE  Final   Gram Stain   Final    RARE WBC PRESENT, PREDOMINANTLY PMN NO ORGANISMS SEEN Performed at [facility], [address], [place], [place] [address]    Culture PENDING  Incomplete  Report Status PENDING  Incomplete         Radiology Studies: Dg Abd 1 View  Result Date: 04/28/2018 CLINICAL DATA:  Acute onset of LOWER abdominal pain and pelvic pain which improved after a bowel movement. EXAM: ABDOMEN - 1 VIEW COMPARISON:  04/24/2018. FINDINGS: Near complete resolution of the previously identified small bowel ileus since the examination 4 days ago. Only 2 or 3 residual  mildly distended loops of small bowel persist in the LEFT UPPER QUADRANT. Gas and stool throughout decompressed colon. No suggestion of free air on the supine image. Surgical clips in the RIGHT UPPER QUADRANT from prior cholecystectomy. Degenerative changes involving the lumbar spine and both hips. IMPRESSION: Near complete resolution of small bowel ileus since the examination 4 days ago. Electronically Signed   By: Evangeline Dakin M.D.   On: 04/28/2018 13:42   Dg Chest Port 1 View  Result Date: 04/29/2018 CLINICAL DATA:  67 year old female with history of aortic valve and mitral valve replacement. EXAM: PORTABLE CHEST 1 VIEW COMPARISON:  Chest x-ray 04/29/2018. FINDINGS: An endotracheal tube is in place with tip 3.8 cm above the carina. There is a right upper extremity PICC with tip terminating in the superior cavoatrial junction. Right IJ Cordis through which a Swan-Ganz catheter has been passed into the proximal right main pulmonary artery. Nasogastric tube extending into the upper abdomen. Bilateral chest tubes in place. Mediastinal drain projecting over the mid to lower thorax. Lung volumes are low. Diffuse interstitial and airspace opacities throughout the lungs bilaterally, favored to reflect underlying pulmonary edema. No appreciable pneumothorax. Trace amount of pleural fluid bilaterally. Heart size is mildly enlarged. Upper mediastinal contours are widened. New median sternotomy wires. Bioprosthetic aortic valve. Postoperative changes of mitral annuloplasty. IMPRESSION: 1. Postoperative changes and support apparatus, as above. 2. The appearance of the lungs suggests pulmonary edema, as above. 3. Trace bilateral pleural effusions. Electronically Signed   By: Vinnie Langton M.D.   On: 04/29/2018 16:18   Dg Chest Port 1 View  Result Date: 04/29/2018 CLINICAL DATA:  Pulmonary edema.  Endocarditis. EXAM: PORTABLE CHEST 1 VIEW COMPARISON:  Yesterday FINDINGS: Right upper extremity PICC with tip at  the upper cavoatrial junction. Bilateral airspace disease is improved but there is still extensive interstitial and airspace opacity. This could be edema and/or infection given the clinical setting. Postoperative right hilum. Chronic cardiomegaly. Right costophrenic sulcus blunting which may be scarring based on September 2019 CT. IMPRESSION: 1. Mild improvement in airspace disease. 2. Stable cardiomegaly. Electronically Signed   By: Monte Fantasia M.D.   On: 04/29/2018 08:21   Dg Chest Port 1 View  Result Date: 04/28/2018 CLINICAL DATA:  History of pulmonary edema, follow-up EXAM: PORTABLE CHEST 1 VIEW COMPARISON:  Portable chest x-ray of 04/26/2017 FINDINGS: The endotracheal tube has been removed. Aeration has improved somewhat. However there are patchy areas of airspace disease bilaterally most consistent with either multifocal pneumonia or residual edema. There may be a small right effusion present. Mild cardiomegaly is stable. No bony abnormality is seen. IMPRESSION: 1. Somewhat better aeration, but there are still patchy areas of airspace disease bilaterally. These may represent multifocal pneumonia or less likely residual edema. 2. Cardiomegaly and small right pleural effusion. Electronically Signed   By: Ivar Drape M.D.   On: 04/28/2018 15:34        Scheduled Meds: . acetaminophen  1,000 mg Oral Q6H   Or  . acetaminophen (TYLENOL) oral liquid 160 mg/5 mL  1,000 mg Per Tube Q6H  .  aspirin EC  325 mg Oral Daily   Or  . aspirin  324 mg Per Tube Daily  . bisacodyl  10 mg Oral Daily   Or  . bisacodyl  10 mg Rectal Daily  . chlorhexidine gluconate (MEDLINE KIT)  15 mL Mouth Rinse BID  . docusate sodium  200 mg Oral Daily  . insulin regular  0-10 Units Intravenous TID WC  . mouth rinse  15 mL Mouth Rinse 10 times per day  . metoprolol tartrate  12.5 mg Oral BID   Or  . metoprolol tartrate  12.5 mg Per Tube BID  . [START ON 05/01/2018] pantoprazole  40 mg Oral Daily  . sodium chloride  flush  3 mL Intravenous Q12H   Continuous Infusions: . sodium chloride 20 mL/hr at 04/30/18 0600  . sodium chloride    . sodium chloride 20 mL/hr (04/29/18 1650)  .  ceFAZolin (ANCEF) IV Stopped (04/30/18 0150)  . dexmedetomidine (PRECEDEX) IV infusion Stopped (04/29/18 2252)  . insulin 1.3 mL/hr at 04/30/18 0600  . lactated ringers    . lactated ringers    . lactated ringers 20 mL/hr at 04/30/18 0600  . levofloxacin (LEVAQUIN) IV    . milrinone 0.2 mcg/kg/min (04/30/18 0600)  . nitroGLYCERIN    . norepinephrine (LEVOPHED) Adult infusion 9 mcg/min (04/30/18 0600)  . norepinephrine (LEVOPHED) Adult infusion 9 mcg/min (04/30/18 0700)  . phenylephrine (NEO-SYNEPHRINE) Adult infusion Stopped (04/30/18 0131)     LOS: 21 days    Time spent:35 mins. More than 50% of that time was spent in counseling and/or coordination of care.      Shelly Coss, MD [facility] Pager [phone]  If 7PM-7AM, please contact night-coverage [email or web address] Password [email or web address] 04/30/2018, 8:11 AM

## 2018-04-30 NOTE — Progress Notes (Signed)
Pharmacy Antibiotic Note  Elizabeth [name] is a 67 y.o. female admitted on 04/09/2018 with Staph aortic valve endocarditis.  Pharmacy has been consulted for Ancef dosing.  Plan: Continue cefazolin 2g IV q8h Duration of antibiotics to be determined by ID, per note on 12/27, restarting ABX clock   Height: 5\' 4"  (162.6 cm) Weight: 263 lb 0.1 oz (119.3 kg) IBW/kg (Calculated) : 54.7  Temp (24hrs), Avg:99.3 F (37.4 C), Min:98.2 F (36.8 C), Max:102 F (38.9 C)  Recent Labs  Lab 04/28/18 0355 04/29/18 0350  04/29/18 1247 04/29/18 1425 04/29/18 1605 04/29/18 2220 04/29/18 2239 04/30/18 0334  WBC 15.8* 21.0*  --   --   --  22.9*  --  17.2* 15.3*  CREATININE 1.32* 0.93   < > 0.80 0.70  --  0.80 0.89 1.01*   < > = values in this interval not displayed.    Estimated Creatinine Clearance: 67.7 mL/min (A) (by C-G formula based on SCr of 1.01 mg/dL (H)).    Allergies  Allergen Reactions  . Penicillins Other (See Comments)    Fever -when she was 67 yrs old Has patient had a PCN reaction causing immediate rash, facial/tongue/throat swelling, SOB or lightheadedness with hypotension: NO Has patient had a PCN reaction causing severe rash involving mucus membranes or skin necrosis: Unknown Has patient had a PCN reaction that required hospitalization: Unknown Has patient had a PCN reaction occurring within the last 10 years: No Childhood allergy If all of the above answers are "NO", then may proceed with Cephalosporin use.     Antimicrobials this admission: Cefazolin 12/9 >>    Microbiology results: 12/28 tissue: * 12/16 synovial fluid cx ngtd 12/10 blood cx ngtd 12/9 synovial fluid staph ludg 12/8 blood cx staph ludg  Thank you for allowing pharmacy to be a part of this patient's care.  Claiborne Billings, PharmD PGY2 Cardiology Pharmacy Resident Phone [phone] Please check AMION for all Pharmacist numbers by unit 04/30/2018 8:32 AM

## 2018-04-30 NOTE — Progress Notes (Signed)
Patient ID: Elizabeth [name], female   DOB: Oct 06, 1950, 67 y.o.   MRN: [id number] TCTS Evening Rounds:  Hemodynamically stable off levophed. Remains on milrinone 0.2. CI 2.1. PA 41/22. Rhythm is atrial fib/flutter with vent rate 60's under pacer. I tried to rapid atrial pace into sinus but could not capture. Will start amio and follow. Continue pacer VVI 80. Amio may put her in CHB but will try to establish sinus.  Start Coumadin slowly tonight for mitral repair and atrial fib. DC swan. BMET    Component Value Date/Time   NA 136 04/30/2018 1719   NA 143 01/05/2017 1131   K 3.1 (L) 04/30/2018 1719   K 4.4 01/05/2017 1131   CL 98 04/30/2018 1719   CL 104 10/02/2012 0949   CO2 25 04/30/2018 0334   CO2 31 (H) 01/05/2017 1131   GLUCOSE 115 (H) 04/30/2018 1719   GLUCOSE 99 01/05/2017 1131   GLUCOSE 104 (H) 10/02/2012 0949   BUN 24 (H) 04/30/2018 1719   BUN 10.2 01/05/2017 1131   CREATININE 0.80 04/30/2018 1719   CREATININE 0.83 11/11/2017 1636   CREATININE 0.9 01/05/2017 1131   CALCIUM 7.3 (L) 04/30/2018 0334   CALCIUM 10.2 01/05/2017 1131   GFRNONAA >60 04/30/2018 1712   GFRAA >60 04/30/2018 1712   Replace K+

## 2018-04-30 NOTE — Anesthesia Postprocedure Evaluation (Signed)
Anesthesia Post Note  Patient: Elizabeth [name]  Procedure(s) Performed: AORTIC VALVE REPLACEMENT (AVR) (N/A Chest) MITRAL VALVE (MV) REPLACEMENT (N/A Chest)     Patient location during evaluation: SICU Anesthesia Type: General Level of consciousness: awake Pain management: pain level controlled Vital Signs Assessment: post-procedure vital signs reviewed and stable Respiratory status: spontaneous breathing Cardiovascular status: stable Postop Assessment: no apparent nausea or vomiting Anesthetic complications: no Comments: Self extubated overnight, mild pain.     Last Vitals:  Vitals:   04/30/18 0600 04/30/18 0700  BP:  118/64  Pulse: 89 80  Resp: (!) 28 (!) 25  Temp:  37.1 C  SpO2: 95% 96%    Last Pain:  Vitals:   04/30/18 0658  TempSrc:   PainSc: [place]

## 2018-04-30 NOTE — Progress Notes (Signed)
1 Day Post-Op Procedure(s) (LRB): AORTIC VALVE REPLACEMENT (AVR) (N/A) MITRAL VALVE (MV) REPLACEMENT (N/A) Subjective:  No complaints. Self-extubated overnight.  Objective: Vital signs in last 24 hours: Temp:  [98.2 F (36.8 C)-102 F (38.9 C)] 98.8 F (37.1 C) (12/29 0700) Pulse Rate:  [76-92] 80 (12/29 0700) Cardiac Rhythm: A-V Sequential paced (12/29 0008) Resp:  [12-35] 25 (12/29 0700) BP: (57-147)/(33-128) 118/64 (12/29 0700) SpO2:  [91 %-100 %] 96 % (12/29 0700) Arterial Line BP: (87-155)/(38-74) 122/51 (12/29 0700) FiO2 (%):  [50 %-55 %] 55 % (12/29 0100) Weight:  [119.3 kg] 119.3 kg (12/29 0600)  Hemodynamic parameters for last 24 hours: PAP: (37-64)/(14-29) 64/20 CO:  [3.3 L/min-4.3 L/min] 4.3 L/min CI:  [1.7 L/min/m2-2 L/min/m2] 2 L/min/m2  Intake/Output from previous day: 12/28 0701 - 12/29 0700 In: 6598.4 [P.O.:60; I.V.:3765.4; LZJQB:3419; IV Piggyback:1127] Out: 4065 [Urine:2720; Blood:1045; Chest Tube:300] Intake/Output this shift: Total I/O In: -  Out: 110 [Urine:30; Chest Tube:80]  General appearance: alert and cooperative Neurologic: intact Heart: regular rate and rhythm, S1, S2 normal, no murmur, click, rub or gallop Lungs: clear to auscultation bilaterally Abdomen: soft, non-tender; bowel sounds normal; no masses,  no organomegaly Extremities: edema moderate Wound: dressing dry  Lab Results: Recent Labs    04/29/18 2239 04/30/18 0334  WBC 17.2* 15.3*  HGB 10.3* 9.7*  HCT 32.6* 30.2*  PLT 93* 94*   BMET:  Recent Labs    04/29/18 0350  04/29/18 2220 04/29/18 2239 04/30/18 0334  NA 139   < > 138  --  137  K 3.2*   < > 4.1  --  3.8  CL 96*   < > 100  --  102  CO2 30  --   --   --  25  GLUCOSE 88   < > 147*  --  112*  BUN 31*   < > 28*  --  27*  CREATININE 0.93   < > 0.80 0.89 1.01*  CALCIUM 7.5*  --   --   --  7.3*   < > = values in this interval not displayed.    PT/INR:  Recent Labs    04/29/18 1605  LABPROT 23.7*  INR 2.15    ABG    Component Value Date/Time   PHART 7.438 04/30/2018 0210   HCO3 26.0 04/30/2018 0210   TCO2 27 04/30/2018 0210   O2SAT 81.7 04/30/2018 0625   CBG (last 3)  Recent Labs    04/30/18 0433 04/30/18 0542 04/30/18 0645  GLUCAP 107* 103* 117*   CXR: stable pulmonary edema and atelectasis right lung.   Assessment/Plan: S/P Procedure(s) (LRB): AORTIC VALVE REPLACEMENT (AVR) (N/A) MITRAL VALVE (MV) annuloplasty  POD 1 AVR and Mitral annuloplasty for 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 Will resume Ancef per ID rec for 6 weeks postop.  Hemodynamically stable on milrinone 0.2 and levophed 9. CI 2.5 and Co-ox 82 this am. Wean levophed as tolerated.   Rhythm appears junctional brady under pacer so will continue AV pacing at 90 for now. Hold off on beta blocker. High risk for CHB due to endocarditis with preop heart block, s/p AVR and Mitral annuloplasty.   Keep chest tubes in this am and may remove later today.  PT/OT consults tomorrow.  Discussed clinical status and plans at bedside.   LOS: 21 days    Elizabeth [name] 04/30/2018

## 2018-04-30 NOTE — Progress Notes (Signed)
[facility] for Infectious Disease    Date of Admission:  04/09/2018   Total days of antibiotics 21-cefazolin           ID: Elizabeth [name] is a 67 y.o. female with  Initial presentation of sepsis due to Staph ludgunensis bacteremia originating from left hip septic arthritis -thought to be associated with recent steroid injection in Nov. 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 Had all 4 chest tube removed this afternoon. Has temp pacing wire in place. And right IJ. Denies chills, significant chest pain.  Medications:  . acetaminophen  1,000 mg Oral Q6H   Or  . acetaminophen (TYLENOL) oral liquid 160 mg/5 mL  1,000 mg Per Tube Q6H  . aspirin EC  325 mg Oral Daily   Or  . aspirin  324 mg Per Tube Daily  . bisacodyl  10 mg Oral Daily   Or    . bisacodyl  10 mg Rectal Daily  . chlorhexidine gluconate (MEDLINE KIT)  15 mL Mouth Rinse BID  . docusate sodium  200 mg Oral Daily  . insulin aspart  0-15 Units Subcutaneous Q4H  . mouth rinse  15 mL Mouth Rinse 10 times per day  . [START ON 05/01/2018] pantoprazole  40 mg Oral Daily  . sodium chloride flush  3 mL Intravenous Q12H    Objective: Vital signs in last 24 hours: Temp:  [98.1 F (36.7 C)-102 F (38.9 C)] 98.1 F (36.7 C) (12/29 1000) Pulse Rate:  [76-92] 88 (12/29 1000) Resp:  [12-35] 15 (12/29 1000) BP: (57-147)/(33-128) 111/46 (12/29 1000) SpO2:  [91 %-100 %] 97 % (12/29 1000) Arterial Line BP: (87-155)/(38-74) 121/57 (12/29 1000) FiO2 (%):  [50 %-55 %] 55 % (12/29 0100) Weight:  [119.3 kg] 119.3 kg (12/29 0600)  Physical Exam  Constitutional:  oriented to person, place, and time. appears well-developed and well-nourished. No distress.  HENT: [place]/AT, PERRLA, no scleral icterus Neck: right ij in palce Mouth/Throat: Oropharynx is clear and moist. No oropharyngeal exudate.  Cardiovascular: Normal rate, regular rhythm and normal heart sounds.no murmurs Pulmonary/Chest: Effort normal and breath sounds normal. No respiratory distress.  has no wheezes.  Abdominal: Soft. Bowel sounds are normal.  exhibits no distension. There is no tenderness.  Ext: +1 pitting edema, right UE picc line is  c/d/i Skin: Skin is warm and dry. No rash noted. No erythema.  Psychiatric: a normal mood and affect.  behavior is normal.     Lab Results Recent Labs    04/29/18 0350  04/29/18 2220 04/29/18 2239 04/30/18 0334  WBC 21.0*   < >  --  17.2* 15.3*  HGB 7.7*   < > 10.9* 10.3* 9.7*  HCT 26.2*   < > 32.0* 32.6* 30.2*  NA 139   < > 138  --  137  K 3.2*   < > 4.1  --  3.8  CL 96*   < > 100  --  102  CO2 30  --   --   --  25  BUN 31*   < > 28*  --  27*  CREATININE 0.93   < > 0.80 0.89 1.01*   < > = values in this interval not displayed.   Microbiology: 12/10 blood cx  ngtd 12/16 hip synovial fluid ngtd 12/28 tissue cx pending Studies/Results: Dg Abd 1 View  Result Date: 04/28/2018 CLINICAL DATA:  Acute onset of LOWER abdominal pain and pelvic pain which improved after a bowel movement. EXAM: ABDOMEN - 1 VIEW COMPARISON:  04/24/2018. FINDINGS: Near complete resolution of the previously identified small bowel ileus since the examination 4 days ago. Only 2 or 3 residual mildly distended loops of small bowel persist in the LEFT UPPER QUADRANT. Gas and stool throughout decompressed colon. No suggestion of free air on the supine image. Surgical clips in the RIGHT UPPER QUADRANT from prior cholecystectomy. Degenerative changes involving the lumbar spine and both hips. IMPRESSION: Near complete resolution of small bowel ileus since the examination 4 days ago. Electronically Signed   By: Evangeline Dakin M.D.   On: 04/28/2018 13:42   Dg Chest Port 1 View  Result Date: 04/30/2018 CLINICAL DATA:  68 year old female with history of aortic valve replacement and mitral valve repair. Followup study. EXAM: PORTABLE CHEST 1 VIEW COMPARISON:  Chest x-ray 04/29/2018. FINDINGS: Patient has been extubated. Previously noted right upper extremity PICC, Swan-Ganz catheter, chest tubes and mediastinal drains appear in similar position to the prior study. Lung volumes remain slightly low. Patchy multifocal interstitial and airspace disease noted throughout the lungs bilaterally, with slight improvement in aeration compared to yesterday's examination. Small bilateral pleural effusions. No appreciable pneumothorax. Heart size is mildly enlarged. Upper mediastinal contours are widened, within normal limits for a postoperative patient. IMPRESSION: 1. Stable support apparatus and postoperative changes. Overall appearance the chest is very similar to the prior study, with slight improved aeration, favored to reflect slightly improved pulmonary edema. Electronically Signed   By: Vinnie Langton M.D.    On: 04/30/2018 09:07   Dg Chest Port 1 View  Result Date: 04/29/2018 CLINICAL DATA:  67 year old female with history of aortic valve and mitral valve replacement. EXAM: PORTABLE CHEST 1 VIEW COMPARISON:  Chest x-ray 04/29/2018. FINDINGS: An endotracheal tube is in place with tip 3.8 cm above the carina. There is a right upper extremity PICC with tip terminating in the superior cavoatrial junction. Right IJ Cordis through which a Swan-Ganz catheter has been passed into the proximal right main pulmonary artery. Nasogastric tube extending into the upper abdomen. Bilateral chest tubes in place. Mediastinal drain projecting over the mid to lower thorax. Lung volumes are low. Diffuse interstitial and airspace opacities throughout the lungs bilaterally, favored to reflect underlying pulmonary edema. No appreciable pneumothorax. Trace amount of pleural fluid bilaterally. Heart size is mildly enlarged. Upper  mediastinal contours are widened. New median sternotomy wires. Bioprosthetic aortic valve. Postoperative changes of mitral annuloplasty. IMPRESSION: 1. Postoperative changes and support apparatus, as above. 2. The appearance of the lungs suggests pulmonary edema, as above. 3. Trace bilateral pleural effusions. Electronically Signed   By: Vinnie Langton M.D.   On: 04/29/2018 16:18   Dg Chest Port 1 View  Result Date: 04/29/2018 CLINICAL DATA:  Pulmonary edema.  Endocarditis. EXAM: PORTABLE CHEST 1 VIEW COMPARISON:  Yesterday FINDINGS: Right upper extremity PICC with tip at the upper cavoatrial junction. Bilateral airspace disease is improved but there is still extensive interstitial and airspace opacity. This could be edema and/or infection given the clinical setting. Postoperative right hilum. Chronic cardiomegaly. Right costophrenic sulcus blunting which may be scarring based on September 2019 CT. IMPRESSION: 1. Mild improvement in airspace disease. 2. Stable cardiomegaly. Electronically Signed   By:  Monte Fantasia M.D.   On: 04/29/2018 08:21   Dg Chest Port 1 View  Result Date: 04/28/2018 CLINICAL DATA:  History of pulmonary edema, follow-up EXAM: PORTABLE CHEST 1 VIEW COMPARISON:  Portable chest x-ray of 04/26/2017 FINDINGS: The endotracheal tube has been removed. Aeration has improved somewhat. However there are patchy areas of airspace disease bilaterally most consistent with either multifocal pneumonia or residual edema. There may be a small right effusion present. Mild cardiomegaly is stable. No bony abnormality is seen. IMPRESSION: 1. Somewhat better aeration, but there are still patchy areas of airspace disease bilaterally. These may represent multifocal pneumonia or less likely residual edema. 2. Cardiomegaly and small right pleural effusion. Electronically Signed   By: Ivar Drape M.D.   On: 04/28/2018 15:34     Assessment/Plan: Staph lugdunensis AV endocarditis with severe aortic insufficiency and severe mitral regurgitation with CHB, cardiogenic shock. Had been on nearly 3 wk of iv abtx prior to surgery. Last positive cx on 12/10. POD #1 s/p AVR and MV repair  - remain on cefazolin 2gm IV Q 8hr. Plan to treat for 6 wk - question remains to whether we need to add rifampin since now has prosthetic material in place - will wait to see if any growth from tissue valve sent on 12/28. If positive, would recommend to add rifampin if no drug interaction with other materials.  Leukocytosis = continues to trend down  CHB = still may need pacemaker if not improving.  Dr Johnnye Sima to see tomorrow  [facility] Cell: [phone] Pager: [phone]  04/30/2018, 10:14 AM

## 2018-05-01 ENCOUNTER — Inpatient Hospital Stay (HOSPITAL_COMMUNITY): Payer: Medicare Other

## 2018-05-01 DIAGNOSIS — A412 Sepsis due to unspecified staphylococcus: Secondary | ICD-10-CM

## 2018-05-01 DIAGNOSIS — Z952 Presence of prosthetic heart valve: Secondary | ICD-10-CM

## 2018-05-01 DIAGNOSIS — Z9889 Other specified postprocedural states: Secondary | ICD-10-CM

## 2018-05-01 LAB — PROTIME-INR
INR: 1.98
PROTHROMBIN TIME: 22.3 s — AB (ref 11.4–15.2)

## 2018-05-01 LAB — BASIC METABOLIC PANEL
Anion gap: 10 (ref 5–15)
BUN: 24 mg/dL — ABNORMAL HIGH (ref 8–23)
CO2: 27 mmol/L (ref 22–32)
Calcium: 7.5 mg/dL — ABNORMAL LOW (ref 8.9–10.3)
Chloride: 97 mmol/L — ABNORMAL LOW (ref 98–111)
Creatinine, Ser: 0.89 mg/dL (ref 0.44–1.00)
Glucose, Bld: 138 mg/dL — ABNORMAL HIGH (ref 70–99)
Potassium: 3.5 mmol/L (ref 3.5–5.1)
Sodium: 134 mmol/L — ABNORMAL LOW (ref 135–145)

## 2018-05-01 LAB — COOXEMETRY PANEL
Carboxyhemoglobin: 1.6 % — ABNORMAL HIGH (ref 0.5–1.5)
Methemoglobin: 1.6 % — ABNORMAL HIGH (ref 0.0–1.5)
O2 Saturation: 71.4 %
Total hemoglobin: 9.4 g/dL — ABNORMAL LOW (ref 12.0–16.0)

## 2018-05-01 LAB — CBC WITH DIFFERENTIAL/PLATELET
Abs Immature Granulocytes: 0.23 10*3/uL — ABNORMAL HIGH (ref 0.00–0.07)
Basophils Absolute: 0 10*3/uL (ref 0.0–0.1)
Basophils Relative: 0 %
Eosinophils Absolute: 0.2 10*3/uL (ref 0.0–0.5)
Eosinophils Relative: 1 %
HCT: 31.2 % — ABNORMAL LOW (ref 36.0–46.0)
Hemoglobin: 9.6 g/dL — ABNORMAL LOW (ref 12.0–15.0)
Immature Granulocytes: 2 %
Lymphocytes Relative: 11 %
Lymphs Abs: 1.5 10*3/uL (ref 0.7–4.0)
MCH: 28.8 pg (ref 26.0–34.0)
MCHC: 30.8 g/dL (ref 30.0–36.0)
MCV: 93.7 fL (ref 80.0–100.0)
Monocytes Absolute: 0.8 10*3/uL (ref 0.1–1.0)
Monocytes Relative: 6 %
NEUTROS PCT: 80 %
Neutro Abs: 10.7 10*3/uL — ABNORMAL HIGH (ref 1.7–7.7)
Platelets: 63 10*3/uL — ABNORMAL LOW (ref 150–400)
RBC: 3.33 MIL/uL — ABNORMAL LOW (ref 3.87–5.11)
RDW: 17.5 % — ABNORMAL HIGH (ref 11.5–15.5)
WBC: 13.4 10*3/uL — ABNORMAL HIGH (ref 4.0–10.5)
nRBC: 0.4 % — ABNORMAL HIGH (ref 0.0–0.2)

## 2018-05-01 LAB — GLUCOSE, CAPILLARY
GLUCOSE-CAPILLARY: 141 mg/dL — AB (ref 70–99)
GLUCOSE-CAPILLARY: 98 mg/dL (ref 70–99)
Glucose-Capillary: 105 mg/dL — ABNORMAL HIGH (ref 70–99)
Glucose-Capillary: 99 mg/dL (ref 70–99)

## 2018-05-01 MED ORDER — FUROSEMIDE 10 MG/ML IJ SOLN
80.0000 mg | Freq: Once | INTRAMUSCULAR | Status: AC
  Administered 2018-05-01: 80 mg via INTRAVENOUS
  Filled 2018-05-01: qty 8

## 2018-05-01 MED ORDER — POTASSIUM CHLORIDE CRYS ER 20 MEQ PO TBCR
40.0000 meq | EXTENDED_RELEASE_TABLET | Freq: Two times a day (BID) | ORAL | Status: AC
  Administered 2018-05-01 (×2): 40 meq via ORAL
  Filled 2018-05-01 (×2): qty 2

## 2018-05-01 MED ORDER — POTASSIUM CHLORIDE CRYS ER 20 MEQ PO TBCR
20.0000 meq | EXTENDED_RELEASE_TABLET | ORAL | Status: AC | PRN
  Administered 2018-05-01 – 2018-05-05 (×3): 20 meq via ORAL
  Filled 2018-05-01 (×2): qty 1

## 2018-05-01 MED ORDER — METOLAZONE 5 MG PO TABS
5.0000 mg | ORAL_TABLET | Freq: Once | ORAL | Status: AC
  Administered 2018-05-01: 5 mg via ORAL
  Filled 2018-05-01: qty 1

## 2018-05-01 MED ORDER — RIFAMPIN 300 MG PO CAPS
300.0000 mg | ORAL_CAPSULE | Freq: Two times a day (BID) | ORAL | Status: DC
  Administered 2018-05-01 – 2018-05-11 (×21): 300 mg via ORAL
  Filled 2018-05-01 (×22): qty 1

## 2018-05-01 MED FILL — Potassium Chloride Inj 2 mEq/ML: INTRAVENOUS | Qty: 40 | Status: AC

## 2018-05-01 MED FILL — Heparin Sodium (Porcine) Inj 1000 Unit/ML: INTRAMUSCULAR | Qty: 2500 | Status: AC

## 2018-05-01 MED FILL — Magnesium Sulfate Inj 50%: INTRAMUSCULAR | Qty: 10 | Status: AC

## 2018-05-01 MED FILL — Heparin Sodium (Porcine) Inj 1000 Unit/ML: INTRAMUSCULAR | Qty: 30 | Status: AC

## 2018-05-01 NOTE — Evaluation (Signed)
Physical Therapy Evaluation Patient Details Name: Elizabeth [name] MRN: [id number] DOB: [birth year] Today's Date: 05/01/2018   History of Present Illness  67 y.o. 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 During this hospitalization she had wash out of hip on 12/16 and is now s/p Median Sternotomy, Aortic valve replacement pericardial valve and MR repair with Mitral valve annuloplasty.  Clinical Impression  Orders received for PT re-evaluation. Patient demonstrates significant deficits in functional mobility as indicated below complicated now by further deconditioning and restrictions from sternal precautions. Attempted to encourage patient for standing trials, patient reluctant due to weakness and effort to perform getting OOB, agreeable to attempt lateral scoot transfer. Patient also able to perform LE and trunk activation exercises while in recliner. Given current deficits, continue to feel SNF is appropriate. Will see as indicated and progress as tolerated.      Follow Up Recommendations SNF    Equipment Recommendations  (tbd)    Recommendations for Other Services       Precautions / Restrictions Precautions Precautions: Fall;Knee Precaution Comments: left ant. hip incision; pt fatigues very easily Restrictions Weight Bearing Restrictions: Yes(sternal precautions) LLE Weight Bearing: Weight bearing as tolerated      Mobility  Bed Mobility Overal bed mobility: Needs Assistance         Sit to supine: Mod assist;+2 for safety/equipment   General bed mobility comments: cues for sequence; physical assist to manage LEs and to bring trunk up to sitting and to control descent back to bed  Transfers Overall transfer level: Needs assistance   Transfers: Lateral/Scoot Transfers           Lateral/Scoot Transfers: Max assist;+2 physical assistance;+2 safety/equipment General transfer comment: attempted to encourage patient to perform sit <> stand transfer, in discussion patient weary of this and states she feels that she is unable to without being able to support her weighr on UEs due to significant LE weakness. Performed lateral scoot transfer with chuck pad and momentum technique from recliner drop arm side back to bed. Patient able to perform with 2 person max assist and segmental movements.   Ambulation/Gait             General Gait Details: unable to perform  Stairs            Wheelchair Mobility    Modified Rankin (Stroke Patients Only)       Balance                                             Pertinent Vitals/Pain Pain Assessment: Faces Faces Pain Scale: Hurts little more Pain Location: left hip with pressure Pain Descriptors / Indicators: Aching Pain Intervention(s): Monitored during session    Home Living Family/patient expects to be discharged to:: Private residence Living Arrangements: Children Available Help at Discharge: Family Type of Home: House Home Access: Stairs to enter Entrance Stairs-Rails: None Entrance Stairs-Number of Steps: 2 small steps Home Layout: One level Home Equipment: Environmental consultant - 2 wheels;Bedside commode;Cane - single point;Shower seat      Prior Function Level of Independence: Needs assistance      ADL's / Homemaking Assistance Needed: family assisted with LB ADLs and to get onto tub seat.  Used reacher for  pants  Comments: had completed OPPT, ambulated x 15' with cane     Hand Dominance   Dominant Hand: Right    Extremity/Trunk Assessment        Lower Extremity Assessment Lower Extremity Assessment: Generalized weakness RLE Deficits / Details: increased LE edema noted LLE Deficits / Details: required assistanc eto move the leg to bed edge    Cervical / Trunk  Assessment Cervical / Trunk Assessment: (increased body habitus)  Communication   Communication: No difficulties  Cognition Arousal/Alertness: Awake/alert Behavior During Therapy: Flat affect Overall Cognitive Status: Within Functional Limits for tasks assessed                                        General Comments      Exercises Total Joint Exercises Ankle Circles/Pumps: AROM;Both;10 reps Quad Sets: AROM;Both;5 reps(cues for performance in recliner)   Assessment/Plan    PT Assessment Patient needs continued PT services  PT Problem List Decreased strength;Decreased range of motion;Decreased activity tolerance;Decreased mobility;Decreased knowledge of use of DME;Obesity       PT Treatment Interventions DME instruction;Therapeutic exercise;Gait training;Functional mobility training;Therapeutic activities;Patient/family education    PT Goals (Current goals can be found in the Care Plan section)  Acute Rehab PT Goals Patient Stated Goal: regain independence PT Goal Formulation: With patient Time For Goal Achievement: 05/22/18 Potential to Achieve Goals: Good    Frequency Min 2X/week   Barriers to discharge        Co-evaluation PT/OT/SLP Co-Evaluation/Treatment: Yes Reason for Co-Treatment: Complexity of the patient's impairments (multi-system involvement);To address functional/ADL transfers PT goals addressed during session: Mobility/safety with mobility OT goals addressed during session: ADL's and self-care       AM-PAC PT "6 Clicks" Mobility  Outcome Measure Help needed turning from your back to your side while in a flat bed without using bedrails?: A Lot Help needed moving from lying on your back to sitting on the side of a flat bed without using bedrails?: A Lot Help needed moving to and from a bed to a chair (including a wheelchair)?: A Lot Help needed standing up from a chair using your arms (e.g., wheelchair or bedside chair)?: A Lot Help  needed to walk in hospital room?: Total Help needed climbing 3-5 steps with a railing? : Total 6 Click Score: 10    End of Session Equipment Utilized During Treatment: Oxygen Activity Tolerance: Patient limited by fatigue Patient left: in bed;with call bell/phone within reach Nurse Communication: Mobility status;Need for lift equipment PT Visit Diagnosis: Unsteadiness on feet (R26.81)    Time: 8937-3428 PT Time Calculation (min) (ACUTE ONLY): 19 min   Charges:     PT Treatments $Therapeutic Activity: 8-22 mins        Alben Deeds, PT DPT  Board Certified Neurologic Specialist [place] Pager [phone] Office [place] 05/01/2018, 10:19 AM

## 2018-05-01 NOTE — Progress Notes (Signed)
Elizabeth [name] TEAM 1 - Stepdown/ICU TEAM  Elizabeth [name]  ZOX:[id number] DOB: [birth year] DOA: 04/09/2018 PCP: Ann Held, DO    Brief Narrative:  67yo F w/ a hx of HTN and OA who presented 12/8 with complaints of left hip pain and fever. She was hypotensive on presentation. Septic arthritis was suspected as she had received a steroid injection of the left hip in November after which she developed severe left hip pain and fever.   Her workup ultimately revealed bacteremia, septic arthritis, and aortic valve endocarditis with severe aortic insufficiency with perforation. She underwent a left hip I&D. On 12/23 she went into acute respiratory distress with symptomatic bradycardia and was intubated for respiratory distress.  She was weaned off ventilator and extubated on 12/25.  Patient was being followed by Cardiology and ID. She underwent aortic valve replacement , mitral valve annuloplasty through midline sternotomy by CT surgery on 04/29/2018 for aortic valve endocarditis.   Subjective: All active issues are currently being addressed by [facility].   Assessment & Plan:  Aortic valve endocarditis/severe aortic insufficiency with aortic valve perforation  Sepsis/septic arthritis/endocarditis  Acute respiratory distress due to acute pulmonary edema  Valvular Congestive Heart failure Underwent right and left heart catheterization showing mild to moderate nonobstructive coronary disease involving 50% of the proximal RCA.  Heart failure team following.  She had acute diastolic heart failure with cardiogenic shock, tachy/brady syndrome during cardiac cath and paroxysmal atrial fibrillation.  She was treated for supraventricular tachycardia with amiodarone but had to be stopped in the setting of severe bradycardia and first-degree AV block . cardiology following Echocardiogram on 04/12/2017 showed ejection fraction of 60-65%, no wall motion abnormalities.  Patient also underwent TEE.    SVT  Acute kidney injury Resolved   Anemia    DVT prophylaxis: SCDs Code Status: FULL CODE Family Communication: no family present at time of exam  Disposition Plan:   Consultants:  [facility] Cardiology ID  Orthopedics   Antimicrobials:  Cefazolin    Objective: Blood pressure (!) 104/51, pulse (!) 41, temperature 97.6 F (36.4 C), temperature source Oral, resp. rate (!) 22, height 5\' 4"  (1.626 m), weight 119.3 kg, SpO2 98 %.  Intake/Output Summary (Last 24 hours) at 05/01/2018 1355 Last data filed at 05/01/2018 1300 Gross per 24 hour  Intake 1688.22 ml  Output 895 ml  Net 793.22 ml   Filed Weights   04/28/18 1830 04/29/18 0500 04/30/18 0600  Weight: 114.5 kg 113.3 kg 119.3 kg    Examination: No exam from [facility] today   CBC: Recent Labs  Lab 04/30/18 0334 04/30/18 1712 04/30/18 1719 05/01/18 0437  WBC 15.3* 10.4  --  13.4*  NEUTROABS  --   --   --  10.7*  HGB 9.7* 8.9* 9.5* 9.6*  HCT 30.2* 27.5* 28.0* 31.2*  MCV 91.0 92.6  --  93.7  PLT 94* 62*  --  63*   Basic Metabolic Panel: Recent Labs  Lab 04/25/18 0411 04/25/18 1625  04/27/18 0516  04/29/18 0350  04/29/18 2239 04/30/18 0334 04/30/18 1712 04/30/18 1719 05/01/18 0437  NA 140  --    < > 138   < > 139   < >  --  137  --  136 134*  K 3.2*  --    < > 4.2   < > 3.2*   < >  --  3.8  --  3.1* 3.5  CL 95*  --    < > 95*   < >  96*   < >  --  102  --  98 97*  CO2 36*  --    < > 34*   < > 30  --   --  25  --   --  27  GLUCOSE 106*  --    < > 109*   < > 88   < >  --  112*  --  115* 138*  BUN 27*  --    < > 29*   < > 31*   < >  --  27*  --  24* 24*  CREATININE 0.80  --    < > 1.00   < > 0.93   < > 0.89 1.01* 0.87 0.80 0.89  CALCIUM 8.0*  --    < > 8.0*   < > 7.5*  --   --  7.3*  --   --  7.5*  MG 1.8 2.3  --  2.3  --   --   --  2.9* 2.8* 2.2  --   --   PHOS 4.0 3.5  --  3.7  --   --   --   --   --   --   --   --    < > = values in this interval not displayed.   GFR: Estimated Creatinine  Clearance: 76.9 mL/min (by C-G formula based on SCr of 0.89 mg/dL).  Liver Function Tests: Recent Labs  Lab 04/26/18 0430  AST 16  ALT 8  ALKPHOS 41  BILITOT 1.3*  PROT 5.8*  ALBUMIN 2.5*   Coagulation Profile: Recent Labs  Lab 04/29/18 1605 05/01/18 0437  INR 2.15 1.98    HbA1C: Hgb A1c MFr Bld  Date/Time Value Ref Range Status  11/23/2017 11:17 AM 5.5 4.6 - 6.5 % Final    Comment:    Glycemic Control Guidelines for People with Diabetes:Non Diabetic:  <6%Goal of Therapy: <7%Additional Action Suggested:  >8%   01/05/2017 11:31 AM 5.5 4.8 - 5.6 % Final    Comment:             Prediabetes: 5.7 - 6.4          Diabetes: >6.4          Glycemic control for adults with diabetes: <7.0     CBG: Recent Labs  Lab 04/30/18 1134 04/30/18 1558 04/30/18 1958 05/01/18 0650 05/01/18 1137  GLUCAP 154* 103* 132* 141* 105*    Recent Results (from the past 240 hour(s))  Aerobic/Anaerobic Culture (surgical/deep wound)     Status: None (Preliminary result)   Collection Time: 04/29/18 10:09 AM  Result Value Ref Range Status   Specimen Description TISSUE AORTA VEGETATION  Final   Special Requests NONE  Final   Gram Stain   Final    RARE WBC PRESENT, PREDOMINANTLY PMN NO ORGANISMS SEEN    Culture   Final    NO GROWTH 2 DAYS NO ANAEROBES ISOLATED; CULTURE IN PROGRESS FOR 5 DAYS Performed at [facility], [place] [address] [place], [place] [address]    Report Status PENDING  Incomplete     Scheduled Meds: . acetaminophen  1,000 mg Oral Q6H  . aspirin EC  81 mg Oral Daily  . bisacodyl  10 mg Oral Daily  . docusate sodium  200 mg Oral Daily  . guaiFENesin  600 mg Oral BID  . insulin aspart  0-15 Units Subcutaneous Q4H  . pantoprazole  40 mg Oral Daily  .  potassium chloride  40 mEq Oral BID  . rifampin  300 mg Oral Q12H  . sodium chloride flush  3 mL Intravenous Q12H  . warfarin  2.5 mg Oral Daily  . Warfarin - Physician Dosing Inpatient   Does not apply q1800    Continuous Infusions: . sodium chloride Stopped (04/30/18 1844)  . amiodarone 30 mg/hr (05/01/18 1300)  .  ceFAZolin (ANCEF) IV 2 g (05/01/18 0905)  . lactated ringers    . lactated ringers Stopped (04/30/18 1024)  . milrinone 0.2 mcg/kg/min (05/01/18 1300)  . norepinephrine (LEVOPHED) Adult infusion Stopped (05/01/18 0730)     LOS: 22 days   Cherene Altes, MD [facility] Office  [phone] Pager - Text Page per Amion  If 7PM-7AM, please contact night-coverage per Amion 05/01/2018, 1:55 PM

## 2018-05-01 NOTE — Evaluation (Signed)
Occupational Therapy Evaluation Patient Details Name: Elizabeth [name] MRN: [id number] DOB: 10/24/1950 Today's Date: 05/01/2018    History of Present Illness 67 y.o. 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 During this hospitalization she had wash out of hip on 12/16 and is now s/p Median Sternotomy, Aortic valve replacement pericardial valve and MR repair with Mitral valve annuloplasty.   Clinical Impression   Orders received for OT re-evaluation. Patient demonstrates significant deficits in functional transfers as well as independence in ADL with new sternal precautions. Pt presents with deconditioning, decreased balance, decreased access to LB for ADL, decreased activity tolerance.Pt simulated BSC transfer with  lateral scoot transfer. Educated Pt in movement, elevation, and overall edema management of RUE. Given current deficits, continue to feel SNF is essential and appropriate to maximize safety and independence in ADL and transfers. Will follow acutely - next session to focus on functional use of RUE and continued transfer training.    Follow Up Recommendations  SNF    Equipment Recommendations  None recommended by OT    Recommendations for Other Services Other (comment)     Precautions / Restrictions Precautions Precautions: Fall;Knee;Sternal Precaution Booklet Issued: No Precaution Comments: left ant. hip incision; pt fatigues very easily Restrictions Weight Bearing Restrictions: Yes(sternal precautions) LLE Weight Bearing: Weight bearing as tolerated      Mobility Bed Mobility Overal bed mobility: Needs Assistance Bed Mobility: Rolling;Sit to Sidelying Rolling: Mod assist;+2 for safety/equipment     Sit to supine: Mod assist;+2 for safety/equipment Sit to sidelying: Mod  assist;+2 for safety/equipment General bed mobility comments: cues for sequence; physical assist to manage LEs and to bring trunk up to sitting and to control descent back to bed  Transfers Overall transfer level: Needs assistance   Transfers: Lateral/Scoot Transfers          Lateral/Scoot Transfers: Max assist;+2 physical assistance;+2 safety/equipment General transfer comment: attempted to encourage patient to perform sit <> stand transfer, in discussion patient weary of this and states she feels that she is unable to without being able to support her weighr on UEs due to significant LE weakness. Performed lateral scoot transfer with chuck pad and momentum technique from recliner drop arm side back to bed. Patient able to perform with 2 person max assist and segmental movements.     Balance Overall balance assessment: Needs assistance Sitting-balance support: Single extremity supported;Feet supported Sitting balance-Leahy Scale: Fair                                     ADL either performed or assessed with clinical judgement   ADL Overall ADL's : Needs assistance/impaired Eating/Feeding: Set up Eating/Feeding Details (indicate cue type and reason): has been self-feeding with L hand Grooming: Sitting;Minimal assistance Grooming Details (indicate cue type and reason): uses L hand, encouraged use of R Upper Body Bathing: Moderate assistance   Lower Body Bathing: Maximal assistance;Total assistance   Upper Body Dressing : Maximal assistance;Sitting   Lower Body Dressing: Maximal assistance;+2 for physical assistance;+2 for safety/equipment   Toilet Transfer: Maximal assistance;+2 for physical assistance;+2 for safety/equipment;Squat-pivot;BSC;Requires wide/bariatric Toilet Transfer Details (indicate cue type and reason): lateral scoot simulated through chair to bed transfer [place] and Hygiene: Total assistance;Bed level       Functional  mobility during ADLs: Maximal assistance;+2 for physical assistance;+2 for safety/equipment(squat pivot)       Vision Baseline Vision/History: Wears glasses Wears Glasses: At all times Patient Visual Report: No change from baseline       Perception     Praxis      Pertinent Vitals/Pain Pain Assessment: Faces Faces Pain Scale: Hurts little more Pain Location: left hip with pressure Pain Descriptors / Indicators: Aching Pain Intervention(s): Monitored during session;Repositioned     Hand Dominance Right   Extremity/Trunk Assessment Upper Extremity Assessment Upper Extremity Assessment: Generalized weakness;RUE deficits/detail RUE Deficits / Details: edema throughout RUE RUE Sensation: WNL RUE Coordination: decreased fine motor;decreased gross motor LUE Deficits / Details: generalized weakness, non-dominant LUE Sensation: WNL LUE Coordination: decreased gross motor   Lower Extremity Assessment Lower Extremity Assessment: Defer to PT evaluation RLE Deficits / Details: increased LE edema noted LLE Deficits / Details: required assistanc eto move the leg to bed edge   Cervical / Trunk Assessment Cervical / Trunk Assessment: (increased body habitus)   Communication Communication Communication: No difficulties   Cognition Arousal/Alertness: Awake/alert Behavior During Therapy: Flat affect Overall Cognitive Status: Within Functional Limits for tasks assessed                                     General Comments  VSS, RN present throughout transfer    Exercises Exercises: Other exercises Total Joint Exercises Ankle Circles/Pumps: AROM;Both;10 reps Quad Sets: AROM;Both;5 reps(cues for performance in recliner) Other Exercises Other Exercises: R Hand flexion/extension for edema management as well as elevation   Shoulder Instructions      Home Living Family/patient expects to be discharged to:: Private residence Living Arrangements: Children Available  Help at Discharge: Family Type of Home: House Home Access: Stairs to enter [employer] of Steps: 2 small steps Entrance Stairs-Rails: None Home Layout: One level     Bathroom Shower/Tub: Teacher, early years/pre: Handicapped height     Home Equipment: Environmental consultant - 2 wheels;Bedside commode;Cane - single point;Shower seat          Prior Functioning/Environment Level of Independence: Needs assistance    ADL's / Homemaking Assistance Needed: family assisted with LB ADLs and to get onto tub seat.  Used reacher for pants   Comments: had completed OPPT, ambulated x 15' with cane        OT Problem List: Decreased strength;Decreased activity tolerance;Pain;Decreased knowledge of use of DME or AE;Decreased knowledge of precautions;Increased edema;Cardiopulmonary status limiting activity;Obesity;Impaired UE functional use      OT Treatment/Interventions: Self-care/ADL training;DME and/or AE instruction;Therapeutic activities;Patient/family education;Balance training;Therapeutic exercise;Energy conservation(edema management)    OT Goals(Current goals can be found in the care plan section) Acute Rehab OT Goals Patient Stated Goal: regain independence, get to walk on her own OT Goal Formulation: With patient Time For Goal Achievement: 05/15/18 Potential to Achieve Goals: Good ADL Goals Pt Will Perform Grooming: sitting;with set-up  OT Frequency: Min 2X/week   Barriers to D/C:            Co-evaluation PT/OT/SLP Co-Evaluation/Treatment: Yes Reason for Co-Treatment: Complexity of the patient's impairments (multi-system involvement);To address functional/ADL transfers PT goals addressed during session: Mobility/safety with mobility OT goals addressed during session: ADL's and self-care      AM-PAC OT "6 Clicks" Daily Activity     Outcome Measure Help from another person eating meals?: A Little Help from another person taking care of  personal grooming?: A  Little Help from another person toileting, which includes using toliet, bedpan, or urinal?: A Lot Help from another person bathing (including washing, rinsing, drying)?: A Lot Help from another person to put on and taking off regular upper body clothing?: A Lot Help from another person to put on and taking off regular lower body clothing?: A Lot 6 Click Score: 14   End of Session Equipment Utilized During Treatment: Oxygen Nurse Communication: Precautions  Activity Tolerance: Patient limited by fatigue Patient left: in bed;with call bell/phone within reach;with bed alarm set;with nursing/sitter in room  OT Visit Diagnosis: Pain Pain - Right/Left: Left Pain - part of body: Hip(and R knee, and sternum)                Time: 2992-4268 OT Time Calculation (min): 23 min Charges:  OT General Charges $OT Visit: 1 Visit OT Evaluation $OT Re-eval: 1 Re-eval  Hulda Humphrey OTR/L Acute Rehabilitation Services Pager: [phone] Office: [place] 05/01/2018, 11:57 AM

## 2018-05-01 NOTE — Progress Notes (Signed)
PHARMACY CONSULT NOTE FOR:  OUTPATIENT  PARENTERAL ANTIBIOTIC THERAPY (OPAT)  Indication: Staph Lugdunensis endocarditis  Regimen: ancef 2gm IV q8h End date: 06/09/2018  IV antibiotic discharge orders are pended. To discharging provider:  please sign these orders via discharge navigator,  Select New Orders & click on the button choice - Manage This Unsigned Work.     Thank you for allowing pharmacy to be a part of this patient's care.  Hildred Laser, PharmD Clinical Pharmacist **Pharmacist phone directory can now be found on [email or web address] ([email or web address]).  Listed under [place].

## 2018-05-01 NOTE — Progress Notes (Signed)
2 Days Post-Op Procedure(s) (LRB): AORTIC VALVE REPLACEMENT (AVR) (N/A) MITRAL VALVE (MV) REPLACEMENT (N/A) Subjective: Feels tired  Got up to chair with 4 person assist. Could not stand to weigh  Objective: Vital signs in last 24 hours: Temp:  [96.8 F (36 C)-98.6 F (37 C)] 97.6 F (36.4 C) (12/30 0400) Pulse Rate:  [73-98] 80 (12/30 0400) Cardiac Rhythm: Ventricular paced (12/30 0400) Resp:  [0-28] 23 (12/30 0400) BP: (92-123)/(46-74) 117/74 (12/30 0400) SpO2:  [93 %-100 %] 99 % (12/30 0400) Arterial Line BP: (66-147)/(45-123) 130/123 (12/30 0400)  Hemodynamic parameters for last 24 hours: PAP: (39-56)/(21-29) 39/21 CO:  [4.8 L/min] 4.8 L/min CI:  [2.2 L/min/m2] 2.2 L/min/m2  Intake/Output from previous day: 12/29 0701 - 12/30 0700 In: 1659.3 [P.O.:330; I.V.:847.7; IV Piggyback:481.6] Out: 790 [Urine:690; Chest Tube:100] Intake/Output this shift: No intake/output data recorded.  General appearance: alert and cooperative Neurologic: intact Heart: regular rate and rhythm, S1, S2 normal, no murmur, click, rub or gallop Lungs: rales bilaterally Extremities: edema moderate  Wound: dressings dry  Lab Results: Recent Labs    04/30/18 1712 04/30/18 1719 05/01/18 0437  WBC 10.4  --  13.4*  HGB 8.9* 9.5* 9.6*  HCT 27.5* 28.0* 31.2*  PLT 62*  --  63*   BMET:  Recent Labs    04/30/18 0334  04/30/18 1719 05/01/18 0437  NA 137  --  136 134*  K 3.8  --  3.1* 3.5  CL 102  --  98 97*  CO2 25  --   --  27  GLUCOSE 112*  --  115* 138*  BUN 27*  --  24* 24*  CREATININE 1.01*   < > 0.80 0.89  CALCIUM 7.3*  --   --  7.5*   < > = values in this interval not displayed.    PT/INR:  Recent Labs    05/01/18 0437  LABPROT 22.3*  INR 1.98   ABG    Component Value Date/Time   PHART 7.438 04/30/2018 0210   HCO3 26.0 04/30/2018 0210   TCO2 27 04/30/2018 1719   O2SAT 71.4 05/01/2018 0426   CBG (last 3)  Recent Labs    04/30/18 1558 04/30/18 1958 05/01/18 0650    GLUCAP 103* 132* 141*   CXR: bilateral pulmonary edema  Assessment/Plan: S/P Procedure(s) (LRB): AORTIC VALVE REPLACEMENT (AVR) (N/A) MITRAL VALVE (MV) REPLACEMENT (N/A)  POD 2   , Fernande Boyden, MD  Physician  Cardiothoracic Surgery  Progress Notes  Signed  Date of Service:  04/30/2018 8:51 AM          Signed      Expand All Collapse All    1 Day Post-Op Procedure(s) (LRB): AORTIC VALVE REPLACEMENT (AVR) (N/A) MITRAL VALVE (MV) REPLACEMENT (N/A) Subjective:  No complaints. Self-extubated overnight.  Objective: Vital signs in last 24 hours: Temp:  [98.2 F (36.8 C)-102 F (38.9 C)] 98.8 F (37.1 C) (12/29 0700) Pulse Rate:  [76-92] 80 (12/29 0700) Cardiac Rhythm: A-V Sequential paced (12/29 0008) Resp:  [12-35] 25 (12/29 0700) BP: (57-147)/(33-128) 118/64 (12/29 0700) SpO2:  [91 %-100 %] 96 % (12/29 0700) Arterial Line BP: (87-155)/(38-74) 122/51 (12/29 0700) FiO2 (%):  [50 %-55 %] 55 % (12/29 0100) Weight:  [119.3 kg] 119.3 kg (12/29 0600)  Hemodynamic parameters for last 24 hours: PAP: (37-64)/(14-29) 64/20 CO:  [3.3 L/min-4.3 L/min] 4.3 L/min CI:  [1.7 L/min/m2-2 L/min/m2] 2 L/min/m2  Intake/Output from previous day: 12/28 0701 - 12/29 0700 In: 6598.4 [P.O.:60; I.V.:3765.4; UJWJX:9147; IV Piggyback:1127]  Out: 7741 [Urine:2720; Blood:1045; Chest Tube:300] Intake/Output this shift: Total I/O In: -  Out: 110 [Urine:30; Chest Tube:80]  General appearance: alert and cooperative Neurologic: intact Heart: regular rate and rhythm, S1, S2 normal, no murmur, click, rub or gallop Lungs: clear to auscultation bilaterally Abdomen: soft, non-tender; bowel sounds normal; no masses,  no organomegaly Extremities: edema moderate Wound: dressing dry  Lab Results: RecentLabs(last2labs)  Recent Labs    04/29/18 2239 04/30/18 0334  WBC 17.2* 15.3*  HGB 10.3* 9.7*  HCT 32.6* 30.2*  PLT 93* 94*     BMET:  RecentLabs(last2labs)  Recent  Labs    04/29/18 0350  04/29/18 2220 04/29/18 2239 04/30/18 0334  NA 139   < > 138  --  137  K 3.2*   < > 4.1  --  3.8  CL 96*   < > 100  --  102  CO2 30  --   --   --  25  GLUCOSE 88   < > 147*  --  112*  BUN 31*   < > 28*  --  27*  CREATININE 0.93   < > 0.80 0.89 1.01*  CALCIUM 7.5*  --   --   --  7.3*   < > = values in this interval not displayed.      PT/INR:  RecentLabs(last2labs)     Recent Labs    04/29/18 1605  LABPROT 23.7*  INR 2.15     ABG Labs(Brief)     Component Value Date/Time   PHART 7.438 04/30/2018 0210   HCO3 26.0 04/30/2018 0210   TCO2 27 04/30/2018 0210   O2SAT 81.7 04/30/2018 0625     CBG (last 3)  RecentLabs(last2labs)       Recent Labs    04/30/18 0433 04/30/18 0542 04/30/18 0645  GLUCAP 107* 103* 117*     CXR: stable pulmonary edema and atelectasis right lung.   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 Continue Ancef per ID rec for 6 weeks postop. Considering adding Rifampin per ID.  Hemodynamically stable on milrinone 0.2 and levophed 2.  Co-ox 71 this am. Wean levophed as tolerated. Continue milrinone for now.  Rhythm is atrial flutter with vent rate 60's on IV amio. I rapid atrial paced this am into sinus but only lasted less than 30 seconds and back to atrial flutter. Will continue amio and put pacer on backup VVI 50. Hopefully will convert as amio loads.  High risk for CHB due to endocarditis with preop heart block, s/p AVR and Mitral annuloplasty.   Volume excess: pulmonary edema on CXR and peripheral edema. Will give lasix and metolazone this am. Replace K+  PT/OT consults      LOS: 21 days    Gaye Pollack 04/30/2018            LOS: 22  days    Gaye Pollack 05/01/2018

## 2018-05-01 NOTE — Progress Notes (Signed)
Progress Note  Patient Name: Elizabeth [name] Date of Encounter: 05/01/2018  Primary Cardiologist: Skeet Latch, MD   Subjective   Tired this AM. Feels bloated when she eats, which was problematic even before surgery. Pain well controlled.  Inpatient Medications    Scheduled Meds: . acetaminophen  1,000 mg Oral Q6H  . aspirin EC  81 mg Oral Daily  . bisacodyl  10 mg Oral Daily  . docusate sodium  200 mg Oral Daily  . guaiFENesin  600 mg Oral BID  . insulin aspart  0-15 Units Subcutaneous Q4H  . pantoprazole  40 mg Oral Daily  . potassium chloride  40 mEq Oral BID  . sodium chloride flush  3 mL Intravenous Q12H  . warfarin  2.5 mg Oral Daily  . Warfarin - Physician Dosing Inpatient   Does not apply q1800   Continuous Infusions: . sodium chloride Stopped (04/30/18 1844)  . amiodarone 30 mg/hr (05/01/18 1700)  .  ceFAZolin (ANCEF) IV Stopped (05/01/18 0238)  . lactated ringers    . lactated ringers Stopped (04/30/18 1024)  . milrinone 0.2 mcg/kg/min (05/01/18 0600)  . norepinephrine (LEVOPHED) Adult infusion Stopped (05/01/18 0730)   PRN Meds: ALPRAZolam, morphine injection, ondansetron (ZOFRAN) IV, oxyCODONE, potassium chloride, sodium chloride flush, traMADol   Vital Signs    Vitals:   05/01/18 0300 05/01/18 0400 05/01/18 0750 05/01/18 0800  BP:  117/74  117/67  Pulse: 80 80  (!) 34  Resp: 15 (!) 23  (!) 22  Temp:  97.6 F (36.4 C) 98.7 F (37.1 C)   TempSrc:  Oral    SpO2: 99% 99%  100%  Weight:      Height:        Intake/Output Summary (Last 24 hours) at 05/01/2018 0855 Last data filed at 05/01/2018 0800 Gross per 24 hour  Intake 1508.43 ml  Output 680 ml  Net 828.43 ml   Filed Weights   04/28/18 1830 04/29/18 0500 04/30/18 0600  Weight: 114.5 kg 113.3 kg 119.3 kg    Telemetry    Atrial flutter, attempt to rapid pace, but now backup paced - Personally Reviewed  ECG    Atrial flutter - Personally Reviewed  Physical Exam   GEN: No acute  distress.   Neck: No JVD, central line in R IJ. Cardiac: RRR at 50 bpm, no murmurs, rubs, or gallops.  Respiratory: bilateral basilar rales GI: Soft, nontender, non-distended. +BS MS: 1+ bilateral LE edema; No deformity. Neuro:  Nonfocal  Psych: Normal affect   Labs    Chemistry Recent Labs  Lab 04/26/18 0430  04/29/18 0350  04/30/18 0334 04/30/18 1712 04/30/18 1719 05/01/18 0437  NA 142   < > 139   < > 137  --  136 134*  K 3.7   < > 3.2*   < > 3.8  --  3.1* 3.5  CL 97*   < > 96*   < > 102  --  98 97*  CO2 37*   < > 30  --  25  --   --  27  GLUCOSE 110*   < > 88   < > 112*  --  115* 138*  BUN 33*   < > 31*   < > 27*  --  24* 24*  CREATININE 0.81   < > 0.93   < > 1.01* 0.87 0.80 0.89  CALCIUM 7.9*   < > 7.5*  --  7.3*  --   --  7.5*  PROT 5.8*  --   --   --   --   --   --   --   ALBUMIN 2.5*  --   --   --   --   --   --   --   AST 16  --   --   --   --   --   --   --   ALT 8  --   --   --   --   --   --   --   ALKPHOS 41  --   --   --   --   --   --   --   BILITOT 1.3*  --   --   --   --   --   --   --   GFRNONAA >60   < > >60   < > 57* >60  --  >60  GFRAA >60   < > >60   < > >60 >60  --  >60  ANIONGAP 8   < > 13  --  10  --   --  10   < > = values in this interval not displayed.     Hematology Recent Labs  Lab 04/30/18 0334 04/30/18 1712 04/30/18 1719 05/01/18 0437  WBC 15.3* 10.4  --  13.4*  RBC 3.32* 2.97*  --  3.33*  HGB 9.7* 8.9* 9.5* 9.6*  HCT 30.2* 27.5* 28.0* 31.2*  MCV 91.0 92.6  --  93.7  MCH 29.2 30.0  --  28.8  MCHC 32.1 32.4  --  30.8  RDW 17.2* 17.3*  --  17.5*  PLT 94* 62*  --  63*    Cardiac EnzymesNo results for input(s): TROPONINI in the last 168 hours. No results for input(s): TROPIPOC in the last 168 hours.   BNPNo results for input(s): BNP, PROBNP in the last 168 hours.   DDimer No results for input(s): DDIMER in the last 168 hours.   Radiology    Dg Chest Port 1 View  Result Date: 05/01/2018 CLINICAL DATA:  Sore chest status  post AVR. EXAM: PORTABLE CHEST 1 VIEW COMPARISON:  04/30/2018. FINDINGS: Interim removal of mediastinal drainage catheter, Swan-Ganz catheter, left chest tube. Stable mild mediastinal widening. Prior median sternotomy and cardiac valve replacements. Cardiomegaly with diffuse bilateral pulmonary infiltrates suggesting pulmonary edema again noted. Postsurgical changes right lung. Similar findings on prior exam. Small right pleural effusion. No pneumothorax. IMPRESSION: 1. Interim removal of mediastinal drainage catheter, Swan-Ganz catheter, left chest tube. No pneumothorax. 2. Prior median sternotomy and cardiac valve replacements. Cardiomegaly with diffuse bilateral pulmonary infiltrates consistent with pulmonary edema. Small right pleural effusion. These findings are unchanged from prior exam. 3.  Postsurgical changes right lung. Electronically Signed   By: Marcello Moores  Register   On: 05/01/2018 06:40   Dg Chest Port 1 View  Result Date: 04/30/2018 CLINICAL DATA:  67 year old female with history of aortic valve replacement and mitral valve repair. Followup study. EXAM: PORTABLE CHEST 1 VIEW COMPARISON:  Chest x-ray 04/29/2018. FINDINGS: Patient has been extubated. Previously noted right upper extremity PICC, Swan-Ganz catheter, chest tubes and mediastinal drains appear in similar position to the prior study. Lung volumes remain slightly low. Patchy multifocal interstitial and airspace disease noted throughout the lungs bilaterally, with slight improvement in aeration compared to yesterday's examination. Small bilateral pleural effusions. No appreciable pneumothorax. Heart size is mildly enlarged. Upper mediastinal contours are widened, within normal limits for a postoperative  patient. IMPRESSION: 1. Stable support apparatus and postoperative changes. Overall appearance the chest is very similar to the prior study, with slight improved aeration, favored to reflect slightly improved pulmonary edema. Electronically  Signed   By: Vinnie Langton M.D.   On: 04/30/2018 09:07   Dg Chest Port 1 View  Result Date: 04/29/2018 CLINICAL DATA:  66 year old female with history of aortic valve and mitral valve replacement. EXAM: PORTABLE CHEST 1 VIEW COMPARISON:  Chest x-ray 04/29/2018. FINDINGS: An endotracheal tube is in place with tip 3.8 cm above the carina. There is a right upper extremity PICC with tip terminating in the superior cavoatrial junction. Right IJ Cordis through which a Swan-Ganz catheter has been passed into the proximal right main pulmonary artery. Nasogastric tube extending into the upper abdomen. Bilateral chest tubes in place. Mediastinal drain projecting over the mid to lower thorax. Lung volumes are low. Diffuse interstitial and airspace opacities throughout the lungs bilaterally, favored to reflect underlying pulmonary edema. No appreciable pneumothorax. Trace amount of pleural fluid bilaterally. Heart size is mildly enlarged. Upper mediastinal contours are widened. New median sternotomy wires. Bioprosthetic aortic valve. Postoperative changes of mitral annuloplasty. IMPRESSION: 1. Postoperative changes and support apparatus, as above. 2. The appearance of the lungs suggests pulmonary edema, as above. 3. Trace bilateral pleural effusions. Electronically Signed   By: Vinnie Langton M.D.   On: 04/29/2018 16:18    Cardiac Studies   TTE 04/24/18 - Left ventricle: Wall thickness was increased in a pattern of mild   LVH. Left ventricular diastolic function parameters were normal. - Aortic valve: Nodular calcification of the leaflet tips with   severe AS. There was severe stenosis. There was mild   regurgitation. Valve area (VTI): 0.73 cm^2. Valve area (Vmax):   0.69 cm^2. Valve area (Vmean): 0.74 cm^2. - Mitral valve: There was moderate regurgitation. - Left atrium: The atrium was moderately dilated. - Right atrium: The atrium was mildly dilated. - Tricuspid valve: There was moderate  regurgitation. - Pulmonary arteries: PA peak pressure: 56 mm Hg (S).  04/18/18 R/LHC  Ost RCA to Prox RCA lesion is 50% stenosed.  Mid RCA lesion is 20% stenosed.   Findings:  Ao = 95/40 (63)  LV = 123/26 RA =  12 RV = 62/16 PA = 61/28 (38) PCW = 22 Fick cardiac output/index = 5.33/2.5 PVR = 3.0 WU Ao sat = 94% PA sat = 43%, 47%  Assessment: 1. 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 Elevated biventricular pressures 3. Cardiac output is normal however PA sat is quite low at 45% 4. 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 Patient developed atrial flutter in the 50s at the end of the case.  TEE 04/17/18 There was moderate central mitral regurgitation.  ERO by PISA was only 0.14 cm^2 but visually 3+ MR.  There did not appear to be a mitral valve vegetation.  No systolic flow reversal in the pulmonary vein doppler pattern.  The aortic valve was trileaflet with a large vegetation that appeared to involve all leaflets.  There did appear to be be perforation.  There appeared to be severe, eccentric aortic insufficiency with holodiastolic flow reversal in the ascending thoracic aorta.  Mean gradient across the aortic valve was 32 mmHg, suggesting moderate aortic stenosis (elevated gradient was likely due to a combination of high flow from severe AI and leaflet impingement from bulky vegetation.   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  3+ mitral regurgitation without vegetation noted.   OR TEE 04/19/18  Left atrium: Normal size, no evidence of LAA thrombus with PWD velocities > 40 mm/s in LAA  Left ventricle: Normal chamber size and systolic function, LVEF 92-11%, no significant WMAs noted. Mild concentric LVH.  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 Severe valve calcification and thickening present. Severely  decreased leaflet motion. Severe stenosis (Peak > 22mmhg, Mean > 30-43mmhg). Severe AI. Abscess vs false tract near the R coronary cusp with flow throughout cardiac cycle. Cannot rule out perivalvular pathology vs defect at proximal aspect of R coronary leaflet  Mitral valve: Moderate to severe regurgitation. Normal leaflet motion, unable to coapt likely due to dilation of the LV.  Right ventricle: Normal cavity size, wall thickness and ejection fraction. No thrombus present. No mass present.  Tricuspid valve: Mild to moderate regurgitation with central jet..  Pulmonic Valve: No insufficiency seen on color doppler.  Atrial Septum: no evidence of PFO or ASD on color flow doppler.  Aorta: Minimal calcification of the descending aorta and aortic arch, no evidence of dissection.  Pericardium: no significant pericardial effusion.   Post Bypass:  Tricuspid, Pulmonic valve unchanged. Mitral valve repaired with 28 mm Memo 4D ring, trivial MR noted. Aortic valve replaced with 19 mm Edwards Inspiris Resilia Tissue Valve, no perivalvular leak noted. Valve appears to be well seated with no rocking noted. AV gradient normal. Former abscess cavity contains flow does not appear to traverse the new valve. RV function mildly reduced. LV function preserved. LV cavity and wall function unchanged  (CO>3.2, CI>1.7). No aortic dissection noted after cannula removed.    Patient Profile     67 y.o. 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 Will need to continue to monitor to determine if she will need  permanent device. Not without risk given her bacteremia. -on warfarin, amiodarone  Acute on chronic diastolic heart failure -on milrinone post op -admission weight 111.5 kg, weight today 119.3 (though not a standing weight). Weight yesterday 113.3 -received 80 mg IV lasix this AM with metolazone, will monitor output  CAD: nonobstructive, medical management -on aspirin 81 mg, would like statin but given rifampin will hold on starting to avoid confusion if LFTs become elevated  For questions or updates, please contact [facility] Please consult [email or web address] for contact info under    CRITICAL CARE Patient is critically ill and requires high complexity decision making. Total critical care time: 32 minutes. This time includes gathering of history, evaluation of patient's response to treatment, examination of patient, review of laboratory and imaging studies, and coordination with consultants.     Signed, Buford Dresser, MD  05/01/2018, 8:55 AM

## 2018-05-01 NOTE — Progress Notes (Signed)
INFECTIOUS DISEASE PROGRESS NOTE  ID: Elizabeth [name] is a 67 y.o. 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  Denies SOB  Abtx:  Anti-infectives (From admission, onward)   Start     Dose/Rate Route Frequency Ordered Stop   04/30/18 1800  ceFAZolin (ANCEF) IVPB 2g/100 mL premix     2 g 200 mL/hr over 30 Minutes Intravenous Every 8 hours 04/30/18 1734     04/30/18 1000  levofloxacin (LEVAQUIN) IVPB 750 mg     750 mg 100 mL/hr over 90 Minutes Intravenous Every 24 hours 04/29/18 1538 04/30/18 1032   04/29/18 2145  vancomycin (VANCOCIN) IVPB 1000 mg/200 mL premix     1,000 mg 200 mL/hr over 60 Minutes Intravenous  Once 04/29/18 1538 04/29/18 2228   04/29/18 0400  vancomycin (VANCOCIN) 1,500 mg in sodium chloride 0.9 % 250 mL IVPB     1,500 mg 125 mL/hr over 120 Minutes Intravenous To Surgery 04/28/18 1817 04/29/18 0830   04/29/18 0400  levofloxacin (LEVAQUIN) IVPB 500 mg     500 mg 100 mL/hr over 60 Minutes Intravenous To Surgery 04/28/18 1817 04/29/18 0830   04/11/18 1000  vancomycin (VANCOCIN) IVPB 1000 mg/200 mL premix  Status:  Discontinued     1,000 mg 200 mL/hr over 60 Minutes Intravenous Every 36 hours 04/09/18  2107 04/10/18 1532   04/11/18 1000  vancomycin (VANCOCIN) 1,250 mg in sodium chloride 0.9 % 250 mL IVPB  Status:  Discontinued     1,250 mg 166.7 mL/hr over 90 Minutes Intravenous Every 36 hours 04/10/18 1532 04/11/18 1143   04/10/18 1600  ceFAZolin (ANCEF) IVPB 2g/100 mL premix  Status:  Discontinued     2 g 200 mL/hr over 30 Minutes Intravenous Every 8 hours 04/10/18 1545 04/30/18 1735   04/10/18 0600  ceFEPIme (MAXIPIME) 2 g in sodium chloride 0.9 % 100 mL IVPB  Status:  Discontinued     2 g 200 mL/hr over 30 Minutes Intravenous Every 12 hours 04/09/18 2107 04/10/18 1144   04/09/18 2200  vancomycin (VANCOCIN) 500 mg in sodium chloride 0.9 % 100 mL IVPB     500 mg 100 mL/hr over 60 Minutes Intravenous  Once 04/09/18 2107 04/09/18 2349   04/09/18 1800  ceFEPIme (MAXIPIME) 2 g in sodium chloride 0.9 % 100 mL IVPB     2 g 200 mL/hr over 30 Minutes Intravenous  Once 04/09/18 1752 04/09/18 1841   04/09/18 1800  metroNIDAZOLE (FLAGYL) IVPB 500 mg  Status:  Discontinued     500 mg 100 mL/hr over 60 Minutes Intravenous Every 8 hours 04/09/18 1752 04/10/18 1537   04/09/18 1800  vancomycin (VANCOCIN) IVPB 1000 mg/200 mL premix  1,000 mg 200 mL/hr over 60 Minutes Intravenous  Once 04/09/18 1752 04/09/18 2156      Medications:  Scheduled: . acetaminophen  1,000 mg Oral Q6H  . aspirin EC  81 mg Oral Daily  . bisacodyl  10 mg Oral Daily  . docusate sodium  200 mg Oral Daily  . guaiFENesin  600 mg Oral BID  . insulin aspart  0-15 Units Subcutaneous Q4H  . pantoprazole  40 mg Oral Daily  . potassium chloride  40 mEq Oral BID  . sodium chloride flush  3 mL Intravenous Q12H  . warfarin  2.5 mg Oral Daily  . Warfarin - Physician Dosing Inpatient   Does not apply q1800    Objective: Vital signs in last 24 hours: Temp:  [96.8 F (36 C)-98.7 F (37.1 C)] 98.7 F (37.1 C) (12/30 0750) Pulse Rate:  [34-98] 57 (12/30 0900) Resp:  [0-26] 19 (12/30 0900) BP: (92-123)/(46-74) 116/67 (12/30  0900) SpO2:  [93 %-100 %] 100 % (12/30 0900) Arterial Line BP: (66-139)/(45-123) 130/123 (12/30 0400)   General appearance: alert, cooperative and no distress Resp: clear to auscultation bilaterally Cardio: regular rate and rhythm GI: normal findings: bowel sounds normal and soft, non-tender Extremities: edema anasarca  Lab Results Recent Labs    04/30/18 0334 04/30/18 1712 04/30/18 1719 05/01/18 0437  WBC 15.3* 10.4  --  13.4*  HGB 9.7* 8.9* 9.5* 9.6*  HCT 30.2* 27.5* 28.0* 31.2*  NA 137  --  136 134*  K 3.8  --  3.1* 3.5  CL 102  --  98 97*  CO2 25  --   --  27  BUN 27*  --  24* 24*  CREATININE 1.01* 0.87 0.80 0.89   Liver Panel No results for input(s): PROT, ALBUMIN, AST, ALT, ALKPHOS, BILITOT, BILIDIR, IBILI in the last 72 hours. Sedimentation Rate No results for input(s): ESRSEDRATE in the last 72 hours. C-Reactive Protein No results for input(s): CRP in the last 72 hours.  Microbiology: Recent Results (from the past 240 hour(s))  Aerobic/Anaerobic Culture (surgical/deep wound)     Status: None (Preliminary result)   Collection Time: 04/29/18 10:09 AM  Result Value Ref Range Status   Specimen Description TISSUE AORTA VEGETATION  Final   Special Requests NONE  Final   Gram Stain   Final    RARE WBC PRESENT, PREDOMINANTLY PMN NO ORGANISMS SEEN    Culture   Final    NO GROWTH 1 DAY Performed at [facility], [address], [place], [place] [address]    Report Status PENDING  Incomplete    Studies/Results: Dg Chest Port 1 View  Result Date: 05/01/2018 CLINICAL DATA:  Sore chest status post AVR. EXAM: PORTABLE CHEST 1 VIEW COMPARISON:  04/30/2018. FINDINGS: Interim removal of mediastinal drainage catheter, Swan-Ganz catheter, left chest tube. Stable mild mediastinal widening. Prior median sternotomy and cardiac valve replacements. Cardiomegaly with diffuse bilateral pulmonary infiltrates suggesting pulmonary edema again noted. Postsurgical changes right  lung. Similar findings on prior exam. Small right pleural effusion. No pneumothorax. IMPRESSION: 1. Interim removal of mediastinal drainage catheter, Swan-Ganz catheter, left chest tube. No pneumothorax. 2. Prior median sternotomy and cardiac valve replacements. Cardiomegaly with diffuse bilateral pulmonary infiltrates consistent with pulmonary edema. Small right pleural effusion. These findings are unchanged from prior exam. 3.  Postsurgical changes right lung. Electronically Signed   By: Marcello Moores  Register   On: 05/01/2018 06:40   Dg Chest Port 1 View  Result Date: 04/30/2018 CLINICAL DATA:  67 year old female with history of aortic valve replacement and mitral valve repair. Followup study. EXAM: PORTABLE CHEST 1 VIEW COMPARISON:  Chest x-ray 04/29/2018. FINDINGS: Patient has been extubated. Previously noted right upper extremity PICC, Swan-Ganz catheter, chest tubes and mediastinal drains appear in similar position to the prior study. Lung volumes remain slightly low. Patchy multifocal interstitial and airspace disease noted throughout the lungs bilaterally, with slight improvement in aeration compared to yesterday's examination. Small bilateral pleural effusions. No appreciable pneumothorax. Heart size is mildly enlarged. Upper mediastinal contours are widened, within normal limits for a postoperative patient. IMPRESSION: 1. Stable support apparatus and postoperative changes. Overall appearance the chest is very similar to the prior study, with slight improved aeration, favored to reflect slightly improved pulmonary edema. Electronically Signed   By: Vinnie Langton M.D.   On: 04/30/2018 09:07   Dg Chest Port 1 View  Result Date: 04/29/2018 CLINICAL DATA:  67 year old female with history of aortic valve and mitral valve replacement. EXAM: PORTABLE CHEST 1 VIEW COMPARISON:  Chest x-ray 04/29/2018. FINDINGS: An endotracheal tube is in place with tip 3.8 cm above the carina. There is a right upper  extremity PICC with tip terminating in the superior cavoatrial junction. Right IJ Cordis through which a Swan-Ganz catheter has been passed into the proximal right main pulmonary artery. Nasogastric tube extending into the upper abdomen. Bilateral chest tubes in place. Mediastinal drain projecting over the mid to lower thorax. Lung volumes are low. Diffuse interstitial and airspace opacities throughout the lungs bilaterally, favored to reflect underlying pulmonary edema. No appreciable pneumothorax. Trace amount of pleural fluid bilaterally. Heart size is mildly enlarged. Upper mediastinal contours are widened. New median sternotomy wires. Bioprosthetic aortic valve. Postoperative changes of mitral annuloplasty. IMPRESSION: 1. Postoperative changes and support apparatus, as above. 2. The appearance of the lungs suggests pulmonary edema, as above. 3. Trace bilateral pleural effusions. Electronically Signed   By: Vinnie Langton M.D.   On: 04/29/2018 16:18     Assessment/Plan: Staph lugdenensis bacteremia L hip septic arthritis, I & D 12-16 AVR and MVR 12-28 Complete heart block  Total days of antibiotics: 22 ancef  She appears to be doing well Not sure if she will get permanent pacer yet Will add rifampin, discussed side effects with pt (red urine/saliva/tears).  Ast/alt normal 12-25         Bobby Rumpf MD, FACP Infectious Diseases (pager) [phone] [email or web address] 05/01/2018, 9:58 AM  LOS: 22 days

## 2018-05-01 NOTE — Progress Notes (Signed)
CT surgery p.m. Rounds  Patient resting quietly without complaint Sinus rhythm on IV amiodarone Saturation 95% on 1 L nasal cannula Continue current care

## 2018-05-02 ENCOUNTER — Encounter (HOSPITAL_COMMUNITY): Payer: Self-pay | Admitting: Surgery

## 2018-05-02 ENCOUNTER — Inpatient Hospital Stay (HOSPITAL_COMMUNITY): Payer: Medicare Other

## 2018-05-02 DIAGNOSIS — R7989 Other specified abnormal findings of blood chemistry: Secondary | ICD-10-CM

## 2018-05-02 LAB — COMPREHENSIVE METABOLIC PANEL
ALT: 55 U/L — ABNORMAL HIGH (ref 0–44)
AST: 153 U/L — ABNORMAL HIGH (ref 15–41)
Albumin: 2.8 g/dL — ABNORMAL LOW (ref 3.5–5.0)
Alkaline Phosphatase: 94 U/L (ref 38–126)
Anion gap: 11 (ref 5–15)
BUN: 20 mg/dL (ref 8–23)
CO2: 26 mmol/L (ref 22–32)
Calcium: 8.1 mg/dL — ABNORMAL LOW (ref 8.9–10.3)
Chloride: 98 mmol/L (ref 98–111)
Creatinine, Ser: 0.87 mg/dL (ref 0.44–1.00)
GFR calc Af Amer: >60 mL/min (ref >60)
GFR calc non Af Amer: >60 mL/min (ref >60)
Glucose, Bld: 109 mg/dL — ABNORMAL HIGH (ref 70–99)
Potassium: 3.8 mmol/L (ref 3.5–5.1)
Sodium: 135 mmol/L (ref 135–145)
Total Bilirubin: 2.6 mg/dL — ABNORMAL HIGH (ref 0.3–1.2)
Total Protein: 5.8 g/dL — ABNORMAL LOW (ref 6.5–8.1)

## 2018-05-02 LAB — CBC
HCT: 31.1 % — ABNORMAL LOW (ref 36.0–46.0)
HEMOGLOBIN: 10 g/dL — AB (ref 12.0–15.0)
MCH: 29.7 pg (ref 26.0–34.0)
MCHC: 32.2 g/dL (ref 30.0–36.0)
MCV: 92.3 fL (ref 80.0–100.0)
Platelets: 87 10*3/uL — ABNORMAL LOW (ref 150–400)
RBC: 3.37 MIL/uL — ABNORMAL LOW (ref 3.87–5.11)
RDW: 17.6 % — ABNORMAL HIGH (ref 11.5–15.5)
WBC: 15.7 10*3/uL — AB (ref 4.0–10.5)
nRBC: 0.3 % — ABNORMAL HIGH (ref 0.0–0.2)

## 2018-05-02 LAB — GLUCOSE, CAPILLARY
GLUCOSE-CAPILLARY: 108 mg/dL — AB (ref 70–99)
Glucose-Capillary: 132 mg/dL — ABNORMAL HIGH (ref 70–99)

## 2018-05-02 LAB — PROTIME-INR
INR: 7.75 — AB
INR: 2.57
PROTHROMBIN TIME: 27.2 s — AB (ref 11.4–15.2)
Prothrombin Time: 64.1 seconds — ABNORMAL HIGH (ref 11.4–15.2)

## 2018-05-02 LAB — ACID FAST SMEAR (AFB, MYCOBACTERIA): Acid Fast Smear: NEGATIVE

## 2018-05-02 MED ORDER — POTASSIUM CHLORIDE CRYS ER 20 MEQ PO TBCR
40.0000 meq | EXTENDED_RELEASE_TABLET | Freq: Two times a day (BID) | ORAL | Status: AC
  Administered 2018-05-02 (×2): 40 meq via ORAL
  Filled 2018-05-02 (×2): qty 2

## 2018-05-02 MED ORDER — FUROSEMIDE 10 MG/ML IJ SOLN
80.0000 mg | Freq: Two times a day (BID) | INTRAMUSCULAR | Status: AC
  Administered 2018-05-02 (×2): 80 mg via INTRAVENOUS
  Filled 2018-05-02 (×2): qty 8

## 2018-05-02 MED ORDER — METOLAZONE 5 MG PO TABS
5.0000 mg | ORAL_TABLET | Freq: Once | ORAL | Status: AC
  Administered 2018-05-02: 5 mg via ORAL
  Filled 2018-05-02: qty 1

## 2018-05-02 MED ORDER — VITAMIN K1 10 MG/ML IJ SOLN
1.0000 mg | Freq: Once | INTRAVENOUS | Status: AC
  Administered 2018-05-02: 1 mg via INTRAVENOUS
  Filled 2018-05-02: qty 0.1

## 2018-05-02 MED ORDER — ENSURE ENLIVE PO LIQD
237.0000 mL | Freq: Two times a day (BID) | ORAL | Status: DC
  Administered 2018-05-02 – 2018-05-11 (×14): 237 mL via ORAL

## 2018-05-02 MED FILL — Lidocaine HCl(Cardiac) IV PF Soln Pref Syr 100 MG/5ML (2%): INTRAVENOUS | Qty: 5 | Status: AC

## 2018-05-02 MED FILL — Mannitol IV Soln 20%: INTRAVENOUS | Qty: 500 | Status: AC

## 2018-05-02 MED FILL — Sodium Bicarbonate IV Soln 8.4%: INTRAVENOUS | Qty: 50 | Status: AC

## 2018-05-02 MED FILL — Electrolyte-R (PH 7.4) Solution: INTRAVENOUS | Qty: 4000 | Status: AC

## 2018-05-02 MED FILL — Heparin Sodium (Porcine) Inj 1000 Unit/ML: INTRAMUSCULAR | Qty: 40 | Status: AC

## 2018-05-02 MED FILL — Sodium Chloride IV Soln 0.9%: INTRAVENOUS | Qty: 3000 | Status: AC

## 2018-05-02 NOTE — Progress Notes (Signed)
3 Days Post-Op Procedure(s) (LRB): AORTIC VALVE REPLACEMENT (AVR) (N/A) MITRAL VALVE (MV) REPLACEMENT (N/A) Subjective: Feels ok Not much appetite Up in chair  Objective: Vital signs in last 24 hours: Temp:  [97.4 F (36.3 C)-98.1 F (36.7 C)] 98.1 F (36.7 C) (12/31 0400) Pulse Rate:  [41-69] 49 (12/31 0800) Cardiac Rhythm: Atrial fibrillation;Atrial flutter (12/31 0801) Resp:  [16-24] 24 (12/31 0800) BP: (95-119)/(32-79) 104/41 (12/31 0500) SpO2:  [96 %-100 %] 98 % (12/31 0800) Weight:  [119.3 kg] 119.3 kg (12/31 0500)  Hemodynamic parameters for last 24 hours:    Intake/Output from previous day: 12/30 0701 - 12/31 0700 In: 1568.1 [P.O.:600; I.V.:668.1; IV Piggyback:300.1] Out: 2820 [Urine:2820] Intake/Output this shift: Total I/O In: 93.7 [I.V.:93.7] Out: 40 [Urine:40]  General appearance: alert and cooperative Neurologic: intact Heart: irregularly irregular rhythm, no murmur Lungs: clear to auscultation bilaterally Extremities: edema moderate Wound: incision ok  Lab Results: Recent Labs    05/01/18 0437 05/02/18 0647  WBC 13.4* 15.7*  HGB 9.6* 10.0*  HCT 31.2* 31.1*  PLT 63* 87*   BMET:  Recent Labs    05/01/18 0437 05/02/18 0647  NA 134* 135  K 3.5 3.8  CL 97* 98  CO2 27 26  GLUCOSE 138* 109*  BUN 24* 20  CREATININE 0.89 0.87  CALCIUM 7.5* 8.1*    PT/INR:  Recent Labs    05/02/18 0647  LABPROT 64.1*  INR 7.75*   ABG    Component Value Date/Time   PHART 7.438 04/30/2018 0210   HCO3 26.0 04/30/2018 0210   TCO2 27 04/30/2018 1719   O2SAT 71.4 05/01/2018 0426   CBG (last 3)  Recent Labs    05/01/18 1548 05/01/18 2238 05/02/18 0654  GLUCAP 99 98 108*   CXR: improvement in effusions and pulmonary edema  Assessment/Plan:  POD 3  AVR and Mitral annuloplasty for 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 Continue Ancef and Rifampin per  ID rec for 6 weeks postop.   INR jumped from 1.98 to 7.75 this am after 2.5 mg Coumadin probably due to vitamin K def from poor po intake preop, amiodarone for atrial fib, and rifampin started yesterday per ID. Will give her a dose of vit K this am and repeat INR later. Hold Coumadin for now. She needs to be therapeutic on Coumadin with AVR, MV ring and atrial fib. Use of amio and Rifampin will make anticoagulation complicated.  Hemodynamically stable on milrinone 0.2. Will decrease to 0.1.  Has been sinus some and atrial fib/flutter some IV amio. Will continue for now. Check ECG for QT. Pacer on DDD 70 backup.  Hopefully will maintain sinus as amio loads.  High risk for CHB due to endocarditis with preop heart block, s/p AVR and Mitral annuloplasty.   Volume excess: pulmonary edema on CXR and peripheral edema improved. Will continue lasix and metolazone this am. Replace K+  PT/OT    LOS: 23 days    Gaye Pollack 05/02/2018

## 2018-05-02 NOTE — Progress Notes (Signed)
INFECTIOUS DISEASE PROGRESS NOTE  ID: Elizabeth [name] is a 67 y.o. female with  Active Problems:   Left hip pain   Nonrheumatic aortic insufficiency with aortic stenosis   Obesity, Class III, BMI 40-49.9 (morbid obesity) (HCC)   Septic arthritis (HCC)   Sepsis with acute organ dysfunction and septic shock (HCC)   Pressure injury of skin   Chest tightness   Fever   Endocarditis due to Staphylococcus   Infective endocarditis   Thrombocytopenia (HCC)   Anemia of chronic disease   SVT (supraventricular tachycardia) (HCC)   Severe aortic regurgitation   Acute respiratory failure (HCC)   Cardiac arrest, cause unspecified (HCC)   Cardiogenic shock (HCC)   Atrioventricular block, complete (HCC)   Acute pulmonary edema (HCC)   Abdominal pain   Typical atrial flutter (HCC)   S/P AVR (aortic valve replacement)   S/P MVR (mitral valve repair)   Staphylococcal sepsis (HCC)  Subjective: No complaints.   Abtx:  Anti-infectives (From admission, onward)   Start     Dose/Rate Route Frequency Ordered Stop   05/01/18 1030  rifampin (RIFADIN) capsule 300 mg     300 mg Oral Every 12 hours 05/01/18 1020     04/30/18 1800  ceFAZolin (ANCEF) IVPB 2g/100 mL premix     2 g 200 mL/hr over 30 Minutes Intravenous Every 8 hours 04/30/18 1734     04/30/18 1000  levofloxacin (LEVAQUIN) IVPB 750 mg     750 mg 100 mL/hr over 90 Minutes Intravenous Every 24 hours 04/29/18 1538 04/30/18 1032   04/29/18 2145  vancomycin (VANCOCIN) IVPB 1000 mg/200 mL premix     1,000 mg 200 mL/hr over 60 Minutes Intravenous  Once 04/29/18 1538 04/29/18 2228   04/29/18 0400  vancomycin (VANCOCIN) 1,500 mg in sodium chloride 0.9 % 250 mL IVPB     1,500 mg 125 mL/hr over 120 Minutes Intravenous To Surgery 04/28/18 1817 04/29/18 0830   04/29/18 0400  levofloxacin (LEVAQUIN) IVPB 500 mg     500 mg 100 mL/hr over 60 Minutes Intravenous To Surgery 04/28/18 1817 04/29/18 0830   04/11/18 1000  vancomycin (VANCOCIN) IVPB 1000  mg/200 mL premix  Status:  Discontinued     1,000 mg 200 mL/hr over 60 Minutes Intravenous Every 36 hours 04/09/18 2107 04/10/18 1532   04/11/18 1000  vancomycin (VANCOCIN) 1,250 mg in sodium chloride 0.9 % 250 mL IVPB  Status:  Discontinued     1,250 mg 166.7 mL/hr over 90 Minutes Intravenous Every 36 hours 04/10/18 1532 04/11/18 1143   04/10/18 1600  ceFAZolin (ANCEF) IVPB 2g/100 mL premix  Status:  Discontinued     2 g 200 mL/hr over 30 Minutes Intravenous Every 8 hours 04/10/18 1545 04/30/18 1735   04/10/18 0600  ceFEPIme (MAXIPIME) 2 g in sodium chloride 0.9 % 100 mL IVPB  Status:  Discontinued     2 g 200 mL/hr over 30 Minutes Intravenous Every 12 hours 04/09/18 2107 04/10/18 1144   04/09/18 2200  vancomycin (VANCOCIN) 500 mg in sodium chloride 0.9 % 100 mL IVPB     500 mg 100 mL/hr over 60 Minutes Intravenous  Once 04/09/18 2107 04/09/18 2349   04/09/18 1800  ceFEPIme (MAXIPIME) 2 g in sodium chloride 0.9 % 100 mL IVPB     2 g 200 mL/hr over 30 Minutes Intravenous  Once 04/09/18 1752 04/09/18 1841   04/09/18 1800  metroNIDAZOLE (FLAGYL) IVPB 500 mg  Status:  Discontinued     500  mg 100 mL/hr over 60 Minutes Intravenous Every 8 hours 04/09/18 1752 04/10/18 1537   04/09/18 1800  vancomycin (VANCOCIN) IVPB 1000 mg/200 mL premix     1,000 mg 200 mL/hr over 60 Minutes Intravenous  Once 04/09/18 1752 04/09/18 2156      Medications:  Scheduled: . bisacodyl  10 mg Oral Daily  . docusate sodium  200 mg Oral Daily  . feeding supplement (ENSURE ENLIVE)  237 mL Oral BID BM  . furosemide  80 mg Intravenous BID  . guaiFENesin  600 mg Oral BID  . pantoprazole  40 mg Oral Daily  . potassium chloride  40 mEq Oral BID  . rifampin  300 mg Oral Q12H  . sodium chloride flush  3 mL Intravenous Q12H  . Warfarin - Physician Dosing Inpatient   Does not apply q1800    Objective: Vital signs in last 24 hours: Temp:  [97.4 F (36.3 C)-98.1 F (36.7 C)] 98.1 F (36.7 C) (12/31 0400) Pulse  Rate:  [41-98] 69 (12/31 1100) Resp:  [16-24] 17 (12/31 1100) BP: (95-122)/(32-92) 107/92 (12/31 1100) SpO2:  [96 %-100 %] 98 % (12/31 1100) Weight:  [119.3 kg] 119.3 kg (12/31 0500)   General appearance: alert, cooperative and no distress Resp: clear to auscultation bilaterally Cardio: regular rate and rhythm GI: normal findings: bowel sounds normal and soft, non-tender Extremities: edema +  Lab Results Recent Labs    05/01/18 0437 05/02/18 0647  WBC 13.4* 15.7*  HGB 9.6* 10.0*  HCT 31.2* 31.1*  NA 134* 135  K 3.5 3.8  CL 97* 98  CO2 27 26  BUN 24* 20  CREATININE 0.89 0.87   Liver Panel Recent Labs    05/02/18 0647  PROT 5.8*  ALBUMIN 2.8*  AST 153*  ALT 55*  ALKPHOS 94  BILITOT 2.6*   Sedimentation Rate No results for input(s): ESRSEDRATE in the last 72 hours. C-Reactive Protein No results for input(s): CRP in the last 72 hours.  Microbiology: Recent Results (from the past 240 hour(s))  Aerobic/Anaerobic Culture (surgical/deep wound)     Status: None (Preliminary result)   Collection Time: 04/29/18 10:09 AM  Result Value Ref Range Status   Specimen Description TISSUE AORTA VEGETATION  Final   Special Requests NONE  Final   Gram Stain   Final    RARE WBC PRESENT, PREDOMINANTLY PMN NO ORGANISMS SEEN    Culture   Final    NO GROWTH 3 DAYS NO ANAEROBES ISOLATED; CULTURE IN PROGRESS FOR 5 DAYS Performed at [facility], [address] [place], [place] [address]    Report Status PENDING  Incomplete  Acid Fast Smear (AFB)     Status: None   Collection Time: 04/29/18 10:09 AM  Result Value Ref Range Status   AFB Specimen Processing Comment  Final    Comment: Tissue Grinding and Digestion/Decontamination   Acid Fast Smear Negative  Final    Comment: (NOTE) Performed At: [facility] [place], [place] [id number] Rush Farmer MD [id number]    Source (AFB) TISSUE  Final    Comment: AORTA VEGETATION Performed at [facility], [place] [address] [place],  [address]     Studies/Results: Dg Chest Port 1 View  Result Date: 05/02/2018 CLINICAL DATA:  Sore chest.  Followup AVR. EXAM: PORTABLE CHEST 1 VIEW COMPARISON:  05/01/2018. FINDINGS: Interim removal right IJ line. Right PICC line stable position. Prior cardiac valve replacements. Cardiomegaly with bilateral pulmonary infiltrates/edema and bilateral  pleural effusions. These findings have slightly improved from prior exam. Surgical sutures noted over the right lung. No pneumothorax. IMPRESSION: 1. Interim removal of right IJ line. Right PICC line stable position. 2. Prior cardiac valve replacements. Cardiomegaly. Bilateral pulmonary infiltrates/edema bilateral pleural effusions. These findings have slightly improved from prior exam. Electronically Signed   By: [place]   On: 05/02/2018 06:34   Dg Chest Port 1 View  Result Date: 05/01/2018 CLINICAL DATA:  Sore chest status post AVR. EXAM: PORTABLE CHEST 1 VIEW COMPARISON:  04/30/2018. FINDINGS: Interim removal of mediastinal drainage catheter, Swan-Ganz catheter, left chest tube. Stable mild mediastinal widening. Prior median sternotomy and cardiac valve replacements. Cardiomegaly with diffuse bilateral pulmonary infiltrates suggesting pulmonary edema again noted. Postsurgical changes right lung. Similar findings on prior exam. Small right pleural effusion. No pneumothorax. IMPRESSION: 1. Interim removal of mediastinal drainage catheter, Swan-Ganz catheter, left chest tube. No pneumothorax. 2. Prior median sternotomy and cardiac valve replacements. Cardiomegaly with diffuse bilateral pulmonary infiltrates consistent with pulmonary edema. Small right pleural effusion. These findings are unchanged from prior exam. 3.  Postsurgical changes right lung. Electronically Signed   By: Marcello Moores  Register   On: 05/01/2018 06:40     Assessment/Plan: Staph lugdenensis bacteremia L hip septic arthritis, I & D  12-16 AVR and MVR 12-28 Complete heart block  Total days of antibiotics: 23 ancef, rifampin 1  She appears to be doing well Not sure if she will get permanent pacer yet Tolerating rif Ast/alt normal 12-25 AST/ALT 153/55 (12-31). Not sure the increase in her LFTs can be attributed to rifampin after 1 dose.  Pharm will check on 05-05-2018.  Will be available as needed.          Bobby Rumpf MD, FACP Infectious Diseases (pager) [phone] [email or web address] 05/02/2018, 11:37 AM  LOS: 23 days

## 2018-05-02 NOTE — Progress Notes (Signed)
CRITICAL VALUE ALERT  Critical Value: INR 7.75  Date & Time Notied: 12/31 0808  Provider Notified: Cyndia Bent, MD  Orders Received/Actions taken: Vitamin K

## 2018-05-02 NOTE — Progress Notes (Signed)
Patient ID: Elizabeth [name], female   DOB: 09-08-1950, 67 y.o.   MRN: [id number] [facility] Evening Rounds:  Hemodynamically stable on milrinone 0.1  Rhythm is atrial flutter with vent rate 66 on amio drip.  Good diuresis today.  INR 7 this am and received 1 mg vit K. Repeat pending. No Coumadin tonight.

## 2018-05-02 NOTE — Progress Notes (Signed)
Nutrition Follow-up  DOCUMENTATION CODES:   Morbid obesity  INTERVENTION:   Ensure Enlive po BID, each supplement provides 350 kcal and 20 grams of protein   NUTRITION DIAGNOSIS:   Increased nutrient needs related to acute illness, wound healing as evidenced by estimated needs.  Being addressed via supplements  GOAL:   Patient will meet greater than or equal to 90% of their needs  Progressing  MONITOR:   PO intake, Supplement acceptance, Weight trends, Labs, Skin  REASON FOR ASSESSMENT:   Other (Comment)(Pressure Injury report)    ASSESSMENT:   67 year old female with history of HTN and osteoarthritis. She received a steroid injection into L hip in mid-November and shortly after she started having severe L hip pain. She developed fever 1 week ago which resolved and recently returned. She presented to the ED with L hip pain and urinary frequency. She was found to be hypotensive and highly febrile with temperature of 105 degrees F.  12/28 Aortic valve endocarditis with severe aortic insufficiency and severe MR; OR for AVR, mitral valve annuloplasty, median sternotomy 12/28 Self-Extubated 12/29 CL diet 12/30 Heart healthy Diet  Recorded po intake 15-90% post surgery. Pt reports appetite is poor, complains of nausea at times. Ensure Enlive ordered BID, pt drank 2/3 one this AM. Pt requesting extra condiments such as pepper to season food; orders placed in system  Labs: reviewed Meds : lasix, KCl   Diet Order:   Diet Order            Diet Heart Room service appropriate? Yes; Fluid consistency: Thin  Diet effective now              EDUCATION NEEDS:   Education needs have been addressed  Skin:  Skin Assessment: Skin Integrity Issues: Skin Integrity Issues:: Incisions Stage II: sacrum Incisions: L hip (12/16)  Last BM:  12/27  Height:   Ht Readings from Last 1 Encounters:  04/28/18 5\' 4"  (1.626 m)    Weight:   Wt Readings from Last 1 Encounters:   05/02/18 119.3 kg    Ideal Body Weight:  54.54 kg  BMI:  Body mass index is 45.15 kg/m.  Estimated Nutritional Needs:   Kcal:  1800-2000 kcals   Protein:  90-100 g  Fluid:  >/= 1.5 L/day    Kerman Passey MS, RD, LDN, CNSC [phone] Pager  [phone] Weekend/On-Call Pager

## 2018-05-02 NOTE — Progress Notes (Signed)
  [place] TEAM 1 - Stepdown/ICU TEAM  The pt is now post-op from a complicated open heart 2 valve surgery. All active issues are being managed by [facility].   [facility] will sign off at this time, as there are no other active medical issues that are not being managed by [facility]. If there is any way we can further assist in the care of this patient we will be happy to do so.  Cherene Altes, MD [facility] Office  [phone] Pager - Text Page per Amion as per below:  On-Call/Text Page:      [email or web address]      password [email or web address]  If 7PM-7AM, please contact night-coverage [email or web address] Password [email or web address] 05/02/2018, 9:26 AM

## 2018-05-02 NOTE — Progress Notes (Signed)
Progress Note  Patient Name: Elizabeth [name] Date of Encounter: 05/02/2018  Primary Cardiologist: Skeet Latch, MD   Subjective   Feeling about the same as yesterday. No chest pain, breathing stable. Pain well controlled.   Inpatient Medications    Scheduled Meds: . bisacodyl  10 mg Oral Daily  . docusate sodium  200 mg Oral Daily  . feeding supplement (ENSURE ENLIVE)  237 mL Oral BID BM  . furosemide  80 mg Intravenous BID  . guaiFENesin  600 mg Oral BID  . pantoprazole  40 mg Oral Daily  . potassium chloride  40 mEq Oral BID  . rifampin  300 mg Oral Q12H  . sodium chloride flush  3 mL Intravenous Q12H  . Warfarin - Physician Dosing Inpatient   Does not apply q1800   Continuous Infusions: . sodium chloride Stopped (04/30/18 1844)  . amiodarone 30 mg/hr (05/02/18 0900)  .  ceFAZolin (ANCEF) IV Stopped (05/02/18 0137)  . lactated ringers    . lactated ringers Stopped (04/30/18 1024)  . milrinone 0.1 mcg/kg/min (05/02/18 0900)   PRN Meds: ALPRAZolam, morphine injection, ondansetron (ZOFRAN) IV, oxyCODONE, potassium chloride, sodium chloride flush, traMADol   Vital Signs    Vitals:   05/02/18 0500 05/02/18 0700 05/02/18 0800 05/02/18 0900  BP: (!) 104/41  118/62 122/66  Pulse: (!) 52 63 (!) 49 98  Resp: 16 20 (!) 24 20  Temp:      TempSrc:      SpO2: 98% 96% 98% 98%  Weight: 119.3 kg     Height:        Intake/Output Summary (Last 24 hours) at 05/02/2018 1011 Last data filed at 05/02/2018 0900 Gross per 24 hour  Intake 1220.21 ml  Output 3160 ml  Net -1939.79 ml   Filed Weights   04/29/18 0500 04/30/18 0600 05/02/18 0500  Weight: 113.3 kg 119.3 kg 119.3 kg    Telemetry    Brief sinus rhythm with 1st degree AV block, but predominantly atrial flutter vs. fib with pacing- Personally Reviewed  ECG    Atrial flutter 12/30 - Personally Reviewed  Physical Exam   GEN: No acute distress.   Neck: JVD at low neck sitting upright, dressing over prior R  IJ site Cardiac: largely regular with occasional early beats, bradycardic, no murmurs, rubs, or gallops.  Respiratory: bilateral basilar rales GI: Soft, nontender, non-distended. +BS MS: 1+ bilateral LE edema; No deformity. Neuro:  Nonfocal  Psych: Normal affect   Labs    Chemistry Recent Labs  Lab 04/26/18 0430  04/30/18 0334 04/30/18 1712 04/30/18 1719 05/01/18 0437 05/02/18 0647  NA 142   < > 137  --  136 134* 135  K 3.7   < > 3.8  --  3.1* 3.5 3.8  CL 97*   < > 102  --  98 97* 98  CO2 37*   < > 25  --   --  27 26  GLUCOSE 110*   < > 112*  --  115* 138* 109*  BUN 33*   < > 27*  --  24* 24* 20  CREATININE 0.81   < > 1.01* 0.87 0.80 0.89 0.87  CALCIUM 7.9*   < > 7.3*  --   --  7.5* 8.1*  PROT 5.8*  --   --   --   --   --  5.8*  ALBUMIN 2.5*  --   --   --   --   --  2.8*  AST 16  --   --   --   --   --  153*  ALT 8  --   --   --   --   --  55*  ALKPHOS 41  --   --   --   --   --  94  BILITOT 1.3*  --   --   --   --   --  2.6*  GFRNONAA >60   < > 57* >60  --  >60 >60  GFRAA >60   < > >60 >60  --  >60 >60  ANIONGAP 8   < > 10  --   --  10 11   < > = values in this interval not displayed.     Hematology Recent Labs  Lab 04/30/18 1712 04/30/18 1719 05/01/18 0437 05/02/18 0647  WBC 10.4  --  13.4* 15.7*  RBC 2.97*  --  3.33* 3.37*  HGB 8.9* 9.5* 9.6* 10.0*  HCT 27.5* 28.0* 31.2* 31.1*  MCV 92.6  --  93.7 92.3  MCH 30.0  --  28.8 29.7  MCHC 32.4  --  30.8 32.2  RDW 17.3*  --  17.5* 17.6*  PLT 62*  --  63* 87*    Cardiac EnzymesNo results for input(s): TROPONINI in the last 168 hours. No results for input(s): TROPIPOC in the last 168 hours.   BNPNo results for input(s): BNP, PROBNP in the last 168 hours.   DDimer No results for input(s): DDIMER in the last 168 hours.   Radiology    Dg Chest Port 1 View  Result Date: 05/02/2018 CLINICAL DATA:  Sore chest.  Followup AVR. EXAM: PORTABLE CHEST 1 VIEW COMPARISON:  05/01/2018. FINDINGS: Interim removal right IJ  line. Right PICC line stable position. Prior cardiac valve replacements. Cardiomegaly with bilateral pulmonary infiltrates/edema and bilateral pleural effusions. These findings have slightly improved from prior exam. Surgical sutures noted over the right lung. No pneumothorax. IMPRESSION: 1. Interim removal of right IJ line. Right PICC line stable position. 2. Prior cardiac valve replacements. Cardiomegaly. Bilateral pulmonary infiltrates/edema bilateral pleural effusions. These findings have slightly improved from prior exam. Electronically Signed   By: [place]   On: 05/02/2018 06:34   Dg Chest Port 1 View  Result Date: 05/01/2018 CLINICAL DATA:  Sore chest status post AVR. EXAM: PORTABLE CHEST 1 VIEW COMPARISON:  04/30/2018. FINDINGS: Interim removal of mediastinal drainage catheter, Swan-Ganz catheter, left chest tube. Stable mild mediastinal widening. Prior median sternotomy and cardiac valve replacements. Cardiomegaly with diffuse bilateral pulmonary infiltrates suggesting pulmonary edema again noted. Postsurgical changes right lung. Similar findings on prior exam. Small right pleural effusion. No pneumothorax. IMPRESSION: 1. Interim removal of mediastinal drainage catheter, Swan-Ganz catheter, left chest tube. No pneumothorax. 2. Prior median sternotomy and cardiac valve replacements. Cardiomegaly with diffuse bilateral pulmonary infiltrates consistent with pulmonary edema. Small right pleural effusion. These findings are unchanged from prior exam. 3.  Postsurgical changes right lung. Electronically Signed   By: Marcello Moores  Register   On: 05/01/2018 06:40    Cardiac Studies   TTE 04/24/18 - Left ventricle: Wall thickness was increased in a pattern of mild   LVH. Left ventricular diastolic function parameters were normal. - Aortic valve: Nodular calcification of the leaflet tips with   severe AS. There was severe stenosis. There was mild   regurgitation. Valve area (VTI): 0.73 cm^2. Valve  area (Vmax):   0.69 cm^2. Valve area (Vmean): 0.74 cm^2. - Mitral valve: There  was moderate regurgitation. - Left atrium: The atrium was moderately dilated. - Right atrium: The atrium was mildly dilated. - Tricuspid valve: There was moderate regurgitation. - Pulmonary arteries: PA peak pressure: 56 mm Hg (S).  04/18/18 R/LHC  Ost RCA to Prox RCA lesion is 50% stenosed.  Mid RCA lesion is 20% stenosed.   Findings:  Ao = 95/40 (63)  LV = 123/26 RA =  12 RV = 62/16 PA = 61/28 (38) PCW = 22 Fick cardiac output/index = 5.33/2.5 PVR = 3.0 WU Ao sat = 94% PA sat = 43%, 47%  Assessment: 1. 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 Elevated biventricular pressures 3. Cardiac output is normal however PA sat is quite low at 45% 4. 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 Patient developed atrial flutter in the 50s at the end of the case.  TEE 04/17/18 There was moderate central mitral regurgitation.  ERO by PISA was only 0.14 cm^2 but visually 3+ MR.  There did not appear to be a mitral valve vegetation.  No systolic flow reversal in the pulmonary vein doppler pattern.  The aortic valve was trileaflet with a large vegetation that appeared to involve all leaflets.  There did appear to be be perforation.  There appeared to be severe, eccentric aortic insufficiency with holodiastolic flow reversal in the ascending thoracic aorta.  Mean gradient across the aortic valve was 32 mmHg, suggesting moderate aortic stenosis (elevated gradient was likely due to a combination of high flow from severe AI and leaflet impingement from bulky vegetation.   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  3+ mitral regurgitation without vegetation noted.   OR TEE 04/19/18  Left atrium: Normal size, no evidence of LAA thrombus with PWD velocities > 40 mm/s in LAA  Left ventricle: Normal  chamber size and systolic function, LVEF 73-41%, no significant WMAs noted. Mild concentric LVH.  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 Severe valve calcification and thickening present. Severely decreased leaflet motion. Severe stenosis (Peak > 21mmhg, Mean > 30-40mmhg). Severe AI. Abscess vs false tract near the R coronary cusp with flow throughout cardiac cycle. Cannot rule out perivalvular pathology vs defect at proximal aspect of R coronary leaflet  Mitral valve: Moderate to severe regurgitation. Normal leaflet motion, unable to coapt likely due to dilation of the LV.  Right ventricle: Normal cavity size, wall thickness and ejection fraction. No thrombus present. No mass present.  Tricuspid valve: Mild to moderate regurgitation with central jet..  Pulmonic Valve: No insufficiency seen on color doppler.  Atrial Septum: no evidence of PFO or ASD on color flow doppler.  Aorta: Minimal calcification of the descending aorta and aortic arch, no evidence of dissection.  Pericardium: no significant pericardial effusion.   Post Bypass:  Tricuspid, Pulmonic valve unchanged. Mitral valve repaired with 28 mm Memo 4D ring, trivial MR noted. Aortic valve replaced with 19 mm Edwards Inspiris Resilia Tissue Valve, no perivalvular leak noted. Valve appears to be well seated with no rocking noted. AV gradient normal. Former abscess cavity contains flow does not appear to traverse the new valve. RV function mildly reduced. LV function preserved. LV cavity and wall function unchanged  (CO>3.2, CI>1.7). No aortic dissection noted after cannula removed.    Patient Profile     67 y.o. 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 -ideally would like around 5 days post op to determine if she will need permanent device. Not without risk given her bacteremia. -on warfarin, amiodarone. INR jumped from 1.98 to 7.75 today, AST/ALT also mildly elevated today. CBC stable. Holding warfarin, received a dose of vitamin K.  Acute on chronic diastolic heart failure -on milrinone post op, slowly coming down -admission weight 111.5 kg, weight again today 119.3  -on 80 mg IV lasix BID, good urine output. Received metolazone as well today.  -Cr stable  CAD: nonobstructive, medical management -on aspirin 81 mg, would like statin but given rifampin will hold on starting to avoid confusion if LFTs become elevated  For questions or updates, please contact [facility] Please consult [email or web address] for contact info under    CRITICAL CARE Patient is critically ill and requires high complexity decision making. Total critical care time: 30 minutes. This time includes gathering of history, evaluation of patient's response to treatment, examination of patient, review of laboratory and imaging studies, and coordination with consultants.     Signed, Buford Dresser, MD  05/02/2018, 10:11 AM

## 2018-05-03 ENCOUNTER — Inpatient Hospital Stay (HOSPITAL_COMMUNITY): Payer: Medicare Other

## 2018-05-03 DIAGNOSIS — I4892 Unspecified atrial flutter: Secondary | ICD-10-CM

## 2018-05-03 LAB — BASIC METABOLIC PANEL
Anion gap: 9 (ref 5–15)
BUN: 21 mg/dL (ref 8–23)
CO2: 32 mmol/L (ref 22–32)
Calcium: 8 mg/dL — ABNORMAL LOW (ref 8.9–10.3)
Chloride: 92 mmol/L — ABNORMAL LOW (ref 98–111)
Creatinine, Ser: 0.85 mg/dL (ref 0.44–1.00)
GFR calc Af Amer: >60 mL/min (ref >60)
Glucose, Bld: 147 mg/dL — ABNORMAL HIGH (ref 70–99)
Potassium: 3.4 mmol/L — ABNORMAL LOW (ref 3.5–5.1)
Sodium: 133 mmol/L — ABNORMAL LOW (ref 135–145)

## 2018-05-03 LAB — PROTIME-INR
INR: 2.58
Prothrombin Time: 27.3 seconds — ABNORMAL HIGH (ref 11.4–15.2)

## 2018-05-03 LAB — CBC
HCT: 32.9 % — ABNORMAL LOW (ref 36.0–46.0)
Hemoglobin: 10.5 g/dL — ABNORMAL LOW (ref 12.0–15.0)
MCH: 29.2 pg (ref 26.0–34.0)
MCHC: 31.9 g/dL (ref 30.0–36.0)
MCV: 91.4 fL (ref 80.0–100.0)
Platelets: 113 10*3/uL — ABNORMAL LOW (ref 150–400)
RBC: 3.6 MIL/uL — ABNORMAL LOW (ref 3.87–5.11)
RDW: 17.5 % — ABNORMAL HIGH (ref 11.5–15.5)
WBC: 12.8 10*3/uL — ABNORMAL HIGH (ref 4.0–10.5)
nRBC: 0.4 % — ABNORMAL HIGH (ref 0.0–0.2)

## 2018-05-03 LAB — COOXEMETRY PANEL
Carboxyhemoglobin: 2.4 % — ABNORMAL HIGH (ref 0.5–1.5)
Methemoglobin: 1.6 % — ABNORMAL HIGH (ref 0.0–1.5)
O2 Saturation: 95.4 %
Total hemoglobin: 10.8 g/dL — ABNORMAL LOW (ref 12.0–16.0)

## 2018-05-03 MED ORDER — AMIODARONE HCL 200 MG PO TABS
200.0000 mg | ORAL_TABLET | Freq: Two times a day (BID) | ORAL | Status: DC
  Administered 2018-05-03 (×2): 200 mg via ORAL
  Filled 2018-05-03 (×2): qty 1

## 2018-05-03 MED ORDER — POTASSIUM CHLORIDE CRYS ER 20 MEQ PO TBCR
20.0000 meq | EXTENDED_RELEASE_TABLET | Freq: Three times a day (TID) | ORAL | Status: AC
  Administered 2018-05-03 (×3): 20 meq via ORAL
  Filled 2018-05-03 (×3): qty 1

## 2018-05-03 NOTE — Progress Notes (Signed)
Progress Note  Patient Name: Elizabeth [name] Date of Encounter: 05/03/2018  Primary Cardiologist: Skeet Latch, MD  Subjective   Soreness around sternotomy noted.  Feels like it is somewhat difficult to take a deep breath.  Otherwise feels well.  Was able to sit in recliner for several hours yesterday.  Inpatient Medications    Scheduled Meds: . bisacodyl  10 mg Oral Daily  . docusate sodium  200 mg Oral Daily  . feeding supplement (ENSURE ENLIVE)  237 mL Oral BID BM  . guaiFENesin  600 mg Oral BID  . pantoprazole  40 mg Oral Daily  . rifampin  300 mg Oral Q12H  . sodium chloride flush  3 mL Intravenous Q12H  . Warfarin - Physician Dosing Inpatient   Does not apply q1800   Continuous Infusions: . sodium chloride Stopped (04/30/18 1844)  . amiodarone 30 mg/hr (05/03/18 0416)  .  ceFAZolin (ANCEF) IV 2 g (05/03/18 0336)  . lactated ringers    . lactated ringers Stopped (04/30/18 1024)  . milrinone 0.1 mcg/kg/min (05/02/18 2000)   PRN Meds: ALPRAZolam, morphine injection, ondansetron (ZOFRAN) IV, oxyCODONE, potassium chloride, sodium chloride flush, traMADol   Vital Signs    Vitals:   05/02/18 2200 05/02/18 2300 05/03/18 0000 05/03/18 0500  BP: (!) 99/46 (!) 89/49 (!) 107/49 (!) 118/46  Pulse:   69 69  Resp: (!) 22 (!) 32 (!) 25 19  Temp:   98.5 F (36.9 C)   TempSrc:      SpO2:   97% 98%  Weight:      Height:        Intake/Output Summary (Last 24 hours) at 05/03/2018 0738 Last data filed at 05/03/2018 0000 Gross per 24 hour  Intake 785.13 ml  Output 2965 ml  Net -2179.87 ml   Filed Weights   04/29/18 0500 04/30/18 0600 05/02/18 0500  Weight: 113.3 kg 119.3 kg 119.3 kg    Telemetry    Atrial flutter with variable block.  Rare PVC's.  Minimal ventricular pacing over last 12 hours. - Personally Reviewed  ECG    Atrial flutter with 4:1 conduction.  Non-specific ST/T changes and mild QT prolongation - Personally Reviewed  Physical Exam   GEN: No acute  distress.   Neck: Bandages in place; difficult to assess JVP. Cardiac: RRR with 2/6 systolic murmur.  No rubs.  Median sternotomy incision intact. Respiratory: Clear anteriorly.  Normal WOB. GI: Soft, nontender, non-distended  MS: Trace pretibial edema; No deformity. Neuro:  Nonfocal  Psych: Normal affect   Labs    Chemistry Recent Labs  Lab 05/01/18 0437 05/02/18 0647 05/03/18 0432  NA 134* 135 133*  K 3.5 3.8 3.4*  CL 97* 98 92*  CO2 27 26 32  GLUCOSE 138* 109* 147*  BUN 24* 20 21  CREATININE 0.89 0.87 0.85  CALCIUM 7.5* 8.1* 8.0*  PROT  --  5.8*  --   ALBUMIN  --  2.8*  --   AST  --  153*  --   ALT  --  55*  --   ALKPHOS  --  94  --   BILITOT  --  2.6*  --   GFRNONAA >60 >60 >60  GFRAA >60 >60 >60  ANIONGAP 10 11 9      Hematology Recent Labs  Lab 05/01/18 0437 05/02/18 0647 05/03/18 0432  WBC 13.4* 15.7* 12.8*  RBC 3.33* 3.37* 3.60*  HGB 9.6* 10.0* 10.5*  HCT 31.2* 31.1* 32.9*  MCV 93.7 92.3  91.4  MCH 28.8 29.7 29.2  MCHC 30.8 32.2 31.9  RDW 17.5* 17.6* 17.5*  PLT 63* 87* 113*    Cardiac EnzymesNo results for input(s): TROPONINI in the last 168 hours. No results for input(s): TROPIPOC in the last 168 hours.   BNPNo results for input(s): BNP, PROBNP in the last 168 hours.   DDimer No results for input(s): DDIMER in the last 168 hours.   Radiology    Dg Chest Port 1 View  Result Date: 05/02/2018 CLINICAL DATA:  Sore chest.  Followup AVR. EXAM: PORTABLE CHEST 1 VIEW COMPARISON:  05/01/2018. FINDINGS: Interim removal right IJ line. Right PICC line stable position. Prior cardiac valve replacements. Cardiomegaly with bilateral pulmonary infiltrates/edema and bilateral pleural effusions. These findings have slightly improved from prior exam. Surgical sutures noted over the right lung. No pneumothorax. IMPRESSION: 1. Interim removal of right IJ line. Right PICC line stable position. 2. Prior cardiac valve replacements. Cardiomegaly. Bilateral pulmonary  infiltrates/edema bilateral pleural effusions. These findings have slightly improved from prior exam. Electronically Signed   By: [place]   On: 05/02/2018 06:34    Cardiac Studies   TTE 04/24/18 - Left ventricle: Wall thickness was increased in a pattern of mild LVH. Left ventricular diastolic function parameters were normal. - Aortic valve: Nodular calcification of the leaflet tips with severe AS. There was severe stenosis. There was mild regurgitation. Valve area (VTI): 0.73 cm^2. Valve area (Vmax): 0.69 cm^2. Valve area (Vmean): 0.74 cm^2. - Mitral valve: There was moderate regurgitation. - Left atrium: The atrium was moderately dilated. - Right atrium: The atrium was mildly dilated. - Tricuspid valve: There was moderate regurgitation. - Pulmonary arteries: PA peak pressure: 56 mm Hg (S).  04/18/18 R/LHC  Ost RCA to Prox RCA lesion is 50% stenosed.  Mid RCA lesion is 20% stenosed.  Findings:  Ao = 95/40 (63)  LV = 123/26 RA = 12 RV = 62/16 PA = 61/28 (38) PCW = 22 Fick cardiac output/index = 5.33/2.5 PVR = 3.0 WU Ao sat = 94% PA sat = 43%, 47%  Assessment: 1. 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 Elevated biventricular pressures 3. Cardiac output is normal however PA sat is quite low at 45% 4. 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 Patient developed atrial flutter in the 50s at the [place] of the case.  TEE 04/17/18 There was moderate central mitral regurgitation. ERO by PISA was only 0.14 cm^2 but visually 3+ MR. There did not appear to be a mitral valve vegetation. No systolic flow reversal in the pulmonary vein doppler pattern. The aortic valve was trileaflet with a large vegetation that appeared to involve all leaflets. There did appear to be be perforation. There appeared to be severe, eccentric aortic insufficiency with holodiastolic flow reversal in the ascending thoracic aorta. Mean  gradient across the aortic valve was 32 mmHg, suggesting moderate aortic stenosis (elevated gradient was likely due to a combination of high flow from severe AI and leaflet impingement from bulky vegetation.   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 3+ mitral regurgitation without vegetation noted.   OR TEE 04/19/18  Left atrium: Normal size, no evidence of LAA thrombus with PWD velocities > 40 mm/s in LAA  Left ventricle: Normal chamber size and systolic function, LVEF 37-16%, no significant WMAs noted. Mild concentric LVH.  Aortic valve: The valve is bicuspid with fusion of the  L & R coronary leaflets. Severe valve calcification and thickening present. Severely decreased leaflet motion. Severe stenosis (Peak > 89mmhg, Mean > 30-39mmhg). Severe AI. Abscess vs false tract near the R coronary cusp with flow throughout cardiac cycle. Cannot rule out perivalvular pathology vs defect at proximal aspect of R coronary leaflet  Mitral valve: Moderate to severe regurgitation. Normal leaflet motion, unable to coapt likely due to dilation of the LV.  Right ventricle: Normal cavity size, wall thickness and ejection fraction. No thrombus present. No mass present.  Tricuspid valve: Mild to moderate regurgitation with central jet..  Pulmonic Valve: No insufficiency seen on color doppler.  Atrial Septum: no evidence of PFO or ASD on color flow doppler.  Aorta: Minimal calcification of the descending aorta and aortic arch, no evidence of dissection.  Pericardium: no significant pericardial effusion.  Post Bypass:  Tricuspid, Pulmonic valve unchanged. Mitral valve repaired with 28 mm Memo 4D ring, trivial MR noted. Aortic valve replaced with 19 mm Edwards Inspiris Resilia Tissue Valve, no perivalvular leak noted. Valve appears to be well seated with no rocking noted. AV gradient normal. Former abscess cavity contains  flow does not appear to traverse the new valve. RV function mildly reduced. LV function preserved. LV cavity and wall function unchanged (CO>3.2, CI>1.7). No aortic dissection noted after cannula removed.   Patient Profile     68 y.o. 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  Assessment & Plan    Endocarditis of aortic and mitral valves Patient is s/p bioprosthetic AVR and MV repair on 04/29/18 (19 mm Edwards Inspiris resilia pericardial AVR, 28 mm sorin memo 4D MV ring)  Continue cefazolin and rifampin per ID  Continue warfarin; consider adding aspirin when INR stable.  Atrial flutter Remain in flutter with reasonable ventricular rate control.  Minimal ventricular pacing noted on telemetry over the last 12 hours.  Patient remains on amiodarone gtt and warfarin.  INR improved following vitamin K yesterday; therapeutic today at 2.6.  Continue amiodarone for now.  May benefit from [place] prior to discharge if remains in flutter.  Continue warfarin.  AV block appears to be improving.  Continue to monitor; hopefully PPM will not be needed.  Acute on chronic diastolic heart failure Trace edema on exam.  Patient remains on low-dose milrinone.  She is net negative 2.3L over the last 24 hours.  No weight yet today.  Continue to wean milrinone  Maintain slightly negative fluid balance.  Coronary artery disease Mild to moderate disease noted by preop catheterization.  Restart ASA when able.  Defer adding statin in the setting of transaminitis and ongoing rifampin use.  For questions or updates, please contact [facility] [facility] Please consult [email or web address] for contact info under   Signed, Nelva Bush, MD  05/03/2018, 7:38 AM

## 2018-05-03 NOTE — Progress Notes (Signed)
Patient ID: Elizabeth [name], female   DOB: Sep 27, 1950, 68 y.o.   MRN: [id number] [facility] Evening Rounds:  Hemodynamically stable off milrinone. Remain in atrial flutter with 4:1 conduction and ventricular rate 68.  sats 98 on RA  Had BM today.

## 2018-05-03 NOTE — Progress Notes (Signed)
4 Days Post-Op Procedure(s) (LRB): AORTIC VALVE REPLACEMENT (AVR) (N/A) MITRAL VALVE (MV) REPLACEMENT (N/A) Subjective: No complaints  Objective: Vital signs in last 24 hours: Temp:  [97.9 F (36.6 C)-99.4 F (37.4 C)] 98.2 F (36.8 C) (01/01 0748) Pulse Rate:  [64-98] 66 (01/01 0700) Cardiac Rhythm: Normal sinus rhythm (01/01 0000) Resp:  [17-32] 22 (01/01 0700) BP: (89-124)/(39-92) 93/39 (01/01 0600) SpO2:  [92 %-100 %] 100 % (01/01 0700) Weight:  [109.5 kg] 109.5 kg (01/01 0500)  Hemodynamic parameters for last 24 hours:    Intake/Output from previous day: 12/31 0701 - 01/01 0700 In: 785.1 [P.O.:160; I.V.:375.2; IV Piggyback:249.9] Out: 3315 [Urine:3315] Intake/Output this shift: No intake/output data recorded.  General appearance: alert and cooperative Neurologic: intact Heart: regular rate and rhythm, S1, S2 normal, no murmur, click, rub or gallop Lungs: clear to auscultation bilaterally Extremities: edema mostly resolved Wound: incision ok  Lab Results: Recent Labs    05/02/18 0647 05/03/18 0432  WBC 15.7* 12.8*  HGB 10.0* 10.5*  HCT 31.1* 32.9*  PLT 87* 113*   BMET:  Recent Labs    05/02/18 0647 05/03/18 0432  NA 135 133*  K 3.8 3.4*  CL 98 92*  CO2 26 32  GLUCOSE 109* 147*  BUN 20 21  CREATININE 0.87 0.85  CALCIUM 8.1* 8.0*    PT/INR:  Recent Labs    05/03/18 0432  LABPROT 27.3*  INR 2.58   ABG    Component Value Date/Time   PHART 7.438 04/30/2018 0210   HCO3 26.0 04/30/2018 0210   TCO2 27 04/30/2018 1719   O2SAT 95.4 05/03/2018 0441   CBG (last 3)  Recent Labs    05/01/18 2238 05/02/18 0654 05/02/18 1237  GLUCAP 98 108* 132*   CXR: pulmonary edema and pleural effusions resolved.  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  INR jumped from 1.98 to 7.75 due to vitamin K def from poor po intake preop, amiodarone for atrial fib, and rifampin.  INR decreased to 2.57 after 1 mg vitamin K.  She needs to be therapeutic on Coumadin with AVR, MV ring and atrial fib. Use of amio and Rifampin will make anticoagulation complicated but I agree that Rifampin is needed and amio seems to be controlling her atrial flutter and may help get back to sinus. Will hold off on Coumadin today since INR this am is the same as last pm at 2.58.  Hemodynamically stable on milrinone 0.1. Co-ox is 95 which is not accurate but will stop milrinone.   Has remained in atrial flutter with vent rate 68 on  IV amio. I rapid atrial paced into sinus 90's but it only help for a minute and went back to atrial flutter. Will continue amio but switch to po. QTc is 488 which should be ok. ECG confirms atrial flutter with 4:1 conduction and rate 67. Pacer on VVI  Backup 50.  Hopefully will maintain sinus as amio loads.High risk for CHB due to endocarditis with preop heart block, s/p AVR and Mitral annuloplasty but she has not had any heart block over the past several days.   Volume excess: pulmonary edema on CXR and peripheral edema much improved. Weight down 18 lbs from yesterday although probably not accurate.  I don't think she needs further diuresis at this time.  Continue to encourage nutrition, IS  Mobilization with PT. She may be a good candidate for CIR.   LOS: 24 days    Gaye Pollack 05/03/2018

## 2018-05-04 DIAGNOSIS — I5033 Acute on chronic diastolic (congestive) heart failure: Secondary | ICD-10-CM

## 2018-05-04 LAB — BASIC METABOLIC PANEL
Anion gap: 9 (ref 5–15)
BUN: 18 mg/dL (ref 8–23)
CO2: 32 mmol/L (ref 22–32)
Calcium: 8 mg/dL — ABNORMAL LOW (ref 8.9–10.3)
Chloride: 94 mmol/L — ABNORMAL LOW (ref 98–111)
Creatinine, Ser: 0.68 mg/dL (ref 0.44–1.00)
Glucose, Bld: 99 mg/dL (ref 70–99)
Potassium: 3.7 mmol/L (ref 3.5–5.1)
Sodium: 135 mmol/L (ref 135–145)

## 2018-05-04 LAB — AEROBIC/ANAEROBIC CULTURE (SURGICAL/DEEP WOUND): CULTURE: NO GROWTH

## 2018-05-04 LAB — AEROBIC/ANAEROBIC CULTURE W GRAM STAIN (SURGICAL/DEEP WOUND)

## 2018-05-04 LAB — PROTIME-INR
INR: 2.02
Prothrombin Time: 22.6 seconds — ABNORMAL HIGH (ref 11.4–15.2)

## 2018-05-04 MED ORDER — CHLORHEXIDINE GLUCONATE CLOTH 2 % EX PADS
6.0000 | MEDICATED_PAD | Freq: Every day | CUTANEOUS | Status: DC
  Administered 2018-05-04 – 2018-05-10 (×6): 6 via TOPICAL

## 2018-05-04 MED ORDER — FUROSEMIDE 40 MG PO TABS
40.0000 mg | ORAL_TABLET | Freq: Every day | ORAL | Status: DC
  Administered 2018-05-04: 40 mg via ORAL
  Filled 2018-05-04: qty 1

## 2018-05-04 MED ORDER — SODIUM CHLORIDE 0.9% FLUSH: 10.0000 mL | INTRAVENOUS | Status: DC | PRN

## 2018-05-04 MED ORDER — SODIUM CHLORIDE 0.9% FLUSH
10.0000 mL | Freq: Two times a day (BID) | INTRAVENOUS | Status: DC
  Administered 2018-05-05 (×2): 10 mL
  Administered 2018-05-06: 20 mL
  Administered 2018-05-06: 10 mL
  Administered 2018-05-07: 20 mL
  Administered 2018-05-07 – 2018-05-08 (×2): 10 mL
  Administered 2018-05-08: 20 mL
  Administered 2018-05-09 (×2): 10 mL
  Administered 2018-05-10: 20 mL
  Administered 2018-05-10 – 2018-05-11 (×2): 10 mL

## 2018-05-04 MED ORDER — POTASSIUM CHLORIDE CRYS ER 20 MEQ PO TBCR
20.0000 meq | EXTENDED_RELEASE_TABLET | Freq: Three times a day (TID) | ORAL | Status: DC
  Administered 2018-05-04 – 2018-05-06 (×6): 20 meq via ORAL
  Filled 2018-05-04 (×7): qty 1

## 2018-05-04 MED ORDER — WARFARIN SODIUM 1 MG PO TABS
1.0000 mg | ORAL_TABLET | Freq: Every day | ORAL | Status: DC
  Administered 2018-05-04: 1 mg via ORAL
  Filled 2018-05-04: qty 1

## 2018-05-04 MED ORDER — AMIODARONE HCL 200 MG PO TABS
200.0000 mg | ORAL_TABLET | Freq: Every day | ORAL | Status: DC
  Administered 2018-05-04 – 2018-05-11 (×8): 200 mg via ORAL
  Filled 2018-05-04 (×8): qty 1

## 2018-05-04 MED ORDER — METOLAZONE 2.5 MG PO TABS
2.5000 mg | ORAL_TABLET | Freq: Every day | ORAL | Status: DC
  Administered 2018-05-04: 2.5 mg via ORAL
  Filled 2018-05-04: qty 1

## 2018-05-04 MED ORDER — POTASSIUM CHLORIDE CRYS ER 20 MEQ PO TBCR: 20.0000 meq | EXTENDED_RELEASE_TABLET | Freq: Two times a day (BID) | ORAL | Status: DC

## 2018-05-04 NOTE — Progress Notes (Signed)
      [address]       [place] [address]             [phone]      POD # 5 AVR, MV repair  BP 133/62 (BP Location: Left Arm)   Pulse 62   Temp 98 F (36.7 C) (Oral)   Resp (!) 24   Ht 5\' 4"  (1.626 m)   Wt 117.7 kg   SpO2 95%   BMI 44.54 kg/m  Still atrial flutter   Intake/Output Summary (Last 24 hours) at 05/04/2018 1830 Last data filed at 05/04/2018 1700 Gross per 24 hour  Intake 1116.02 ml  Output 650 ml  Net 466.02 ml   Continue current Rx  Sharrod Achille C. Roxan Hockey, MD [facility] [phone]

## 2018-05-04 NOTE — Progress Notes (Addendum)
Physical Therapy Treatment Patient Details Name: Elizabeth [name] MRN: [id number] DOB: 1950/06/03 Today's Date: 05/04/2018    History of Present Illness 68 y.o. 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 During this hospitalization she had wash out of hip on 12/16 and is now s/p Median Sternotomy, Aortic valve replacement pericardial valve and MR repair with Mitral valve annuloplasty on 12/28. PMH - rt TKR (8/19), OA, HTN, lung CA    PT Comments    Pt making slow progress with mobility. Feel pt will continue to make slow, steady progress.    Follow Up Recommendations  SNF     Equipment Recommendations  Other (comment)(tbd)    Recommendations for Other Services       Precautions / Restrictions Precautions Precautions: Fall;Knee Precaution Comments: left ant. hip incision; pt fatigues very easily    Mobility  Bed Mobility           General bed mobility comments: Pt up in chair  Transfers Overall transfer level: Needs assistance Equipment used: Ambulation equipment used Transfers: Sit to/from Stand Sit to Stand: Max assist;+2 physical assistance;Mod assist         General transfer comment: +2 max assist to rise from low chair. +2 min assist to rise from seat of stedy. Used Stedy to transfer chair to bed to bsc. Stood x 5.  Ambulation/Gait             General Gait Details: unable to perform   Stairs             Wheelchair Mobility    Modified Rankin (Stroke Patients Only)       Balance Overall balance assessment: Needs assistance Sitting-balance support: Single extremity supported;Feet supported Sitting balance-Leahy Scale: Poor Sitting balance - Comments: UE support   Standing balance support: Bilateral upper extremity supported Standing balance-Leahy  Scale: Poor Standing balance comment: Pt performed static standing with +2 min assist on Stedy for 20-40 sec each time.                            Cognition Arousal/Alertness: Awake/alert Behavior During Therapy: WFL for tasks assessed/performed Overall Cognitive Status: Within Functional Limits for tasks assessed                                        Exercises General Exercises - Lower Extremity Long Arc Quad: AROM;Both;10 reps;Seated    General Comments        Pertinent Vitals/Pain      Home Living                      Prior Function            PT Goals (current goals can now be found in the care plan section) Progress towards PT goals: Progressing toward goals    Frequency    Min 2X/week      PT Plan Current plan remains appropriate    Co-evaluation              AM-PAC PT "6 Clicks" Mobility   Outcome Measure  Help needed turning from your back to your side while in a flat bed without using bedrails?: A Lot Help needed  moving from lying on your back to sitting on the side of a flat bed without using bedrails?: A Lot Help needed moving to and from a bed to a chair (including a wheelchair)?: Total Help needed standing up from a chair using your arms (e.g., wheelchair or bedside chair)?: Total Help needed to walk in hospital room?: Total Help needed climbing 3-5 steps with a railing? : Total 6 Click Score: 8    End of Session Equipment Utilized During Treatment: Oxygen Activity Tolerance: Patient tolerated treatment well Patient left: with call bell/phone within reach;Other (comment)(on bsc) Nurse Communication: Mobility status;Need for lift equipment PT Visit Diagnosis: Unsteadiness on feet (R26.81)     Time: 6067-7034 PT Time Calculation (min) (ACUTE ONLY): 18 min  Charges:  $Therapeutic Activity: 8-22 mins                     [place] Pager [phone] Office  [place] 05/04/2018, 11:09 AM

## 2018-05-04 NOTE — Progress Notes (Addendum)
Physical Therapy Treatment Patient Details Name: Elizabeth [name] MRN: [id number] DOB: 1951-04-26 Today's Date: 05/04/2018    History of Present Illness 68 y.o. 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 During this hospitalization she had wash out of hip on 12/16 and is now s/p Median Sternotomy, Aortic valve replacement pericardial valve and MR repair with Mitral valve annuloplasty on 12/28. PMH - rt TKR (8/19), OA, HTN, lung CA    PT Comments    Pt continues to make slow progress.    Follow Up Recommendations  SNF     Equipment Recommendations  Other (comment)(tbd)    Recommendations for Other Services       Precautions / Restrictions Precautions Precautions: Fall;Knee Precaution Comments: left ant. hip incision; pt fatigues very easily    Mobility  Bed Mobility Overal bed mobility: Needs Assistance Bed Mobility: Sit to Supine       Sit to supine: +2 for physical assistance;Max assist   General bed mobility comments: Assist to lower trunk and bring legs back up into bed  Transfers Overall transfer level: Needs assistance Equipment used: Ambulation equipment used Transfers: Sit to/from Stand Sit to Stand: Max assist;+2 physical assistance;Mod assist         General transfer comment: +2 max assist to rise from [facility]. +2 mod assist to rise from seat of stedy and +2 mod assist to rise from elevated bed. Pt stood x 3.   Ambulation/Gait             General Gait Details: unable to perform   Stairs             Wheelchair Mobility    Modified Rankin (Stroke Patients Only)       Balance Overall balance assessment: Needs assistance Sitting-balance support: Single extremity supported;Feet supported Sitting balance-Leahy Scale: Poor Sitting balance - Comments: UE  support   Standing balance support: Bilateral upper extremity supported Standing balance-Leahy Scale: Poor Standing balance comment: Pt performed static standing with +2 min assist on Stedy for 20-40 sec each time.                            Cognition Arousal/Alertness: Awake/alert Behavior During Therapy: WFL for tasks assessed/performed Overall Cognitive Status: Within Functional Limits for tasks assessed                                        Exercises   General Comments        Pertinent Vitals/Pain      Home Living                      Prior Function            PT Goals (current goals can now be found in the care plan section) Progress towards PT goals: Progressing toward goals    Frequency    Min 2X/week      PT Plan Current plan remains appropriate    Co-evaluation              AM-PAC PT "6 Clicks" Mobility   Outcome Measure  Help needed turning from your back to your side while in a flat bed without using bedrails?:  A Lot Help needed moving from lying on your back to sitting on the side of a flat bed without using bedrails?: A Lot Help needed moving to and from a bed to a chair (including a wheelchair)?: Total Help needed standing up from a chair using your arms (e.g., wheelchair or bedside chair)?: Total Help needed to walk in hospital room?: Total Help needed climbing 3-5 steps with a railing? : Total 6 Click Score: 8    End of Session Equipment Utilized During Treatment: Oxygen Activity Tolerance: Patient tolerated treatment well Patient left: with call bell/phone within reach;in bed;with nursing/sitter in room Nurse Communication: Mobility status;Need for lift equipment(nurse assisted with transfer) PT Visit Diagnosis: Unsteadiness on feet (R26.81)     Time: 3888-2800 PT Time Calculation (min) (ACUTE ONLY): 17 min  Charges:  $Therapeutic Activity: 8-22 mins                     [place] Pager [phone] Office [place] 05/04/2018, 11:18 AM

## 2018-05-04 NOTE — Progress Notes (Signed)
Progress Note  Patient Name: Elizabeth [name] Date of Encounter: 05/04/2018  Primary Cardiologist: Skeet Latch, MD   Subjective   Sitting in chair eating breakfast. Main issue is sacral pain from sitting in chair and prior pressure sore there. Also with some leg, knee pain. Sternal pain well controlled. Breathing stable.   Inpatient Medications    Scheduled Meds: . amiodarone  200 mg Oral Daily  . docusate sodium  200 mg Oral Daily  . feeding supplement (ENSURE ENLIVE)  237 mL Oral BID BM  . furosemide  40 mg Oral Daily  . guaiFENesin  600 mg Oral BID  . metolazone  2.5 mg Oral Daily  . pantoprazole  40 mg Oral Daily  . potassium chloride  20 mEq Oral TID  . rifampin  300 mg Oral Q12H  . sodium chloride flush  3 mL Intravenous Q12H  . warfarin  1 mg Oral q1800  . Warfarin - Physician Dosing Inpatient   Does not apply q1800   Continuous Infusions: . sodium chloride Stopped (04/30/18 1844)  .  ceFAZolin (ANCEF) IV Stopped (05/04/18 0221)  . lactated ringers 10 mL/hr at 05/04/18 0700   PRN Meds: ALPRAZolam, morphine injection, ondansetron (ZOFRAN) IV, oxyCODONE, potassium chloride, sodium chloride flush, traMADol   Vital Signs    Vitals:   05/04/18 0500 05/04/18 0600 05/04/18 0700 05/04/18 0800  BP: (!) 109/46 (!) 114/45 103/69 104/76  Pulse: 68 66 (!) 59 69  Resp: (!) 21 20 15 18   Temp:   98.1 F (36.7 C)   TempSrc:   Oral   SpO2: 96% 95% 97% 97%  Weight: 117.7 kg     Height:        Intake/Output Summary (Last 24 hours) at 05/04/2018 0903 Last data filed at 05/04/2018 0700 Gross per 24 hour  Intake 424 ml  Output 325 ml  Net 99 ml   Filed Weights   05/02/18 0500 05/03/18 0500 05/04/18 0500  Weight: 119.3 kg 109.5 kg 117.7 kg    Telemetry    Predominantly atrial flutter, less pacing required than prior. Noted rapid a pacing with brief sinus rhythm for several beats, then return to flutter. Personally Reviewed  ECG    Atrial flutter 12/30 - Personally  Reviewed  Physical Exam   GEN: No acute distress.   Neck: JVD at low neck sitting upright, similar to prior Cardiac: largely regular with occasional early beats, no murmurs, rubs, or gallops.  Respiratory: clear upper, coarse at lower fields GI: Soft, nontender, non-distended. +BS MS: 1+ bilateral LE edema, L somewhat >R Neuro:  Nonfocal  Psych: Normal affect   Labs    Chemistry Recent Labs  Lab 05/02/18 0647 05/03/18 0432 05/04/18 0331  NA 135 133* 135  K 3.8 3.4* 3.7  CL 98 92* 94*  CO2 26 32 32  GLUCOSE 109* 147* 99  BUN 20 21 18   CREATININE 0.87 0.85 0.68  CALCIUM 8.1* 8.0* 8.0*  PROT 5.8*  --   --   ALBUMIN 2.8*  --   --   AST 153*  --   --   ALT 55*  --   --   ALKPHOS 94  --   --   BILITOT 2.6*  --   --   GFRNONAA >60 >60 >60  GFRAA >60 >60 >60  ANIONGAP 11 9 9      Hematology Recent Labs  Lab 05/01/18 0437 05/02/18 0647 05/03/18 0432  WBC 13.4* 15.7* 12.8*  RBC 3.33* 3.37* 3.60*  HGB 9.6* 10.0* 10.5*  HCT 31.2* 31.1* 32.9*  MCV 93.7 92.3 91.4  MCH 28.8 29.7 29.2  MCHC 30.8 32.2 31.9  RDW 17.5* 17.6* 17.5*  PLT 63* 87* 113*    Cardiac EnzymesNo results for input(s): TROPONINI in the last 168 hours. No results for input(s): TROPIPOC in the last 168 hours.   BNPNo results for input(s): BNP, PROBNP in the last 168 hours.   DDimer No results for input(s): DDIMER in the last 168 hours.   Radiology    Dg Chest Port 1 View  Result Date: 05/03/2018 CLINICAL DATA:  Status post AVR EXAM: PORTABLE CHEST 1 VIEW COMPARISON:  Chest radiograph 05/02/2018 FINDINGS: Right upper extremity PICC line tip projects over the superior vena cava. Monitoring leads overlie the patient. Stable cardiomegaly. Similar-appearing small right pleural effusion with heterogeneous opacities within the right and left lower lungs. No pneumothorax. IMPRESSION: Similar-appearing small right pleural effusion with bibasilar heterogeneous opacities. Electronically Signed   By: Lovey Newcomer  M.D.   On: 05/03/2018 09:44    Cardiac Studies   TTE 04/24/18 - Left ventricle: Wall thickness was increased in a pattern of mild   LVH. Left ventricular diastolic function parameters were normal. - Aortic valve: Nodular calcification of the leaflet tips with   severe AS. There was severe stenosis. There was mild   regurgitation. Valve area (VTI): 0.73 cm^2. Valve area (Vmax):   0.69 cm^2. Valve area (Vmean): 0.74 cm^2. - Mitral valve: There was moderate regurgitation. - Left atrium: The atrium was moderately dilated. - Right atrium: The atrium was mildly dilated. - Tricuspid valve: There was moderate regurgitation. - Pulmonary arteries: PA peak pressure: 56 mm Hg (S).  04/18/18 R/LHC  Ost RCA to Prox RCA lesion is 50% stenosed.  Mid RCA lesion is 20% stenosed.   Findings:  Ao = 95/40 (63)  LV = 123/26 RA =  12 RV = 62/16 PA = 61/28 (38) PCW = 22 Fick cardiac output/index = 5.33/2.5 PVR = 3.0 WU Ao sat = 94% PA sat = 43%, 47%  Assessment: 1. 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 Elevated biventricular pressures 3. Cardiac output is normal however PA sat is quite low at 45% 4. 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 Patient developed atrial flutter in the 50s at the end of the case.  TEE 04/17/18 There was moderate central mitral regurgitation.  ERO by PISA was only 0.14 cm^2 but visually 3+ MR.  There did not appear to be a mitral valve vegetation.  No systolic flow reversal in the pulmonary vein doppler pattern.  The aortic valve was trileaflet with a large vegetation that appeared to involve all leaflets.  There did appear to be be perforation.  There appeared to be severe, eccentric aortic insufficiency with holodiastolic flow reversal in the ascending thoracic aorta.  Mean gradient across the aortic valve was 32 mmHg, suggesting moderate aortic stenosis (elevated gradient was likely due to a combination of high flow from  severe AI and leaflet impingement from bulky vegetation.   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  3+ mitral regurgitation without vegetation noted.   OR TEE 04/19/18  Left atrium: Normal size, no evidence of LAA thrombus with PWD velocities > 40 mm/s in LAA  Left ventricle: Normal chamber size and systolic function, LVEF 92-42%, no significant WMAs noted. Mild concentric LVH.  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 Severe valve calcification and thickening present. Severely decreased leaflet motion. Severe stenosis (Peak > 40mmhg, Mean > 30-23mmhg). Severe AI. Abscess vs false tract near the R coronary cusp with flow throughout cardiac cycle. Cannot rule out perivalvular pathology vs defect at proximal aspect of R coronary leaflet  Mitral valve: Moderate to severe regurgitation. Normal leaflet motion, unable to coapt likely due to dilation of the LV.  Right ventricle: Normal cavity size, wall thickness and ejection fraction. No thrombus present. No mass present.  Tricuspid valve: Mild to moderate regurgitation with central jet..  Pulmonic Valve: No insufficiency seen on color doppler.  Atrial Septum: no evidence of PFO or ASD on color flow doppler.  Aorta: Minimal calcification of the descending aorta and aortic arch, no evidence of dissection.  Pericardium: no significant pericardial effusion.   Post Bypass:  Tricuspid, Pulmonic valve unchanged. Mitral valve repaired with 28 mm Memo 4D ring, trivial MR noted. Aortic valve replaced with 19 mm Edwards Inspiris Resilia Tissue Valve, no perivalvular leak noted. Valve appears to be well seated with no rocking noted. AV gradient normal. Former abscess cavity contains flow does not appear to traverse the new valve. RV function mildly reduced. LV function preserved. LV cavity and wall function unchanged  (CO>3.2,  CI>1.7). No aortic dissection noted after cannula removed.    Patient Profile     68 y.o. female with staph lugdenensis bacteremia and mitral/aortic valve endocarditis, likely 2/2 septic hip, now s/p AVR and MV repair  Assessment & Plan    Endocarditis of aortic and mitral valves, now s/p AVR and MV repair 04/29/18 (19 mm Edwards Inspiris resilia pericardial AVR, 28 mm sorin memo 4D MV ring) -on ancef and rifampin per ID -LFTs being followed  Atrial flutter/fibrillation, with episodes of bradycardia/heart block: on amiodarone -has overall required less pacing in the last 48 hours, which hopefully suggests she is recovering her conduction system and will not need a permanent pacemaker. She is being loaded with amiodarone. Given that she does not remained in sinus rhythm for more than several beats, I think the utility of cardioversion now is low. As amiodarone loads, hope would be is that she can sustain sinus rhythm. -on warfarin for anticoagulation. INR has been labile given nutrition status, rifampin, and amiodarone concurrent use. If INR remains difficult to manage, could consider DOAC though this is less well studied that warfarin after bioprosthetic valve replacement and should be used only if warfarin dosing is diffiult. -should be on aspirin 81 mg for valve, will add once INR stable  Acute on chronic diastolic heart failure -off drips -admission weight 111.5 kg, weight today 117.7 (though has been fluctating). Slightly positive in last 24 hours -she is on oral lasix and metolazone. Given her edema and nutritional status, if she does not diurese well with this would put her back on IV lasix for a few days. -Cr normal  CAD: nonobstructive, medical management -will be back on aspirin 81 mg once INR stable -would like statin but given rifampin and amiodarone, will hold on starting  For questions or updates, please contact [place] Please consult [email or web address] for contact info  under   TIME SPENT WITH PATIENT: 25 minutes of direct patient care. More than 50% of that time was spent on coordination of care and counseling regarding progression post op and plans for management.  Buford Dresser, MD, PhD [facility]     Signed, [place],  MD  05/04/2018, 9:03 AM

## 2018-05-04 NOTE — Progress Notes (Signed)
5 Days Post-Op Procedure(s) (LRB): AORTIC VALVE REPLACEMENT (AVR) (N/A) MITRAL VALVE (MV) REPLACEMENT (N/A) Subjective:  Feels ok this am. Up in chair.  Objective: Vital signs in last 24 hours: Temp:  [97.8 F (36.6 C)-98.9 F (37.2 C)] 97.8 F (36.6 C) (01/02 0400) Pulse Rate:  [55-84] 59 (01/02 0700) Cardiac Rhythm: Atrial fibrillation (01/02 0600) Resp:  [7-29] 15 (01/02 0700) BP: (85-118)/(37-86) 103/69 (01/02 0700) SpO2:  [92 %-100 %] 97 % (01/02 0700) Weight:  [117.7 kg] 117.7 kg (01/02 0500)  Hemodynamic parameters for last 24 hours:    Intake/Output from previous day: 01/01 0701 - 01/02 0700 In: 833.8 [P.O.:100; I.V.:333.7; IV Piggyback:400.1] Out: 325 [Urine:325] Intake/Output this shift: No intake/output data recorded.  General appearance: alert and cooperative Neurologic: intact Heart: regular rate and rhythm, S1, S2 normal, no murmur, click, rub or gallop Lungs: clear to auscultation bilaterally Extremities: edema mild in legs Wound: incision ok  Lab Results: Recent Labs    05/02/18 0647 05/03/18 0432  WBC 15.7* 12.8*  HGB 10.0* 10.5*  HCT 31.1* 32.9*  PLT 87* 113*   BMET:  Recent Labs    05/03/18 0432 05/04/18 0331  NA 133* 135  K 3.4* 3.7  CL 92* 94*  CO2 32 32  GLUCOSE 147* 99  BUN 21 18  CREATININE 0.85 0.68  CALCIUM 8.0* 8.0*    PT/INR:  Recent Labs    05/04/18 0331  LABPROT 22.6*  INR 2.02   ABG    Component Value Date/Time   PHART 7.438 04/30/2018 0210   HCO3 26.0 04/30/2018 0210   TCO2 27 04/30/2018 1719   O2SAT 95.4 05/03/2018 0441   CBG (last 3)  Recent Labs    05/01/18 2238 05/02/18 0654 05/02/18 1237  GLUCAP 98 108* 132*    Assessment/Plan: S/P Procedure(s) (LRB): AORTIC VALVE REPLACEMENT (AVR) (N/A) MITRAL VALVE (MV) REPLACEMENT (N/A)  POD 5  AVR and Mitral annuloplasty for Staph Lugdunensis AV endocarditis with severe AI and severe MR probably secondary to septic left hip arthritis after steroid  injection.ContinueAncefand Rifampinper ID rec for 6 weeks postop.  INR jumped from 1.98 to 7.75 due to vitamin K def from poor po intake preop, amiodarone for atrial fib, and rifampin.  INR decreased to 2.57 after 1 mg vitamin K.  She needs to be therapeutic on Coumadin with AVR, MV ring and atrial fib. Use of amio and Rifampin will make anticoagulation complicated but I agree that Rifampin is needed and amio seems to be controlling her atrial flutter and may help get back to sinus. INR down to 2.0 this am with no Coumadin yesterday so will start 1 mg daily and follow.  Hemodynamically stable off milrinone.  Has remained in atrial flutter with vent rate 68 on  IV amio. I rapid atrial paced into sinus 90's again this am but it only held for a minute and went back to atrial flutter with higher degree block with V-pacing. Gradually went back to 4:1 block with vent rate 68. Will decrease amio to 200 mg daily.  Pacer on VVI  Backup 50.Hopefully will resume sinusas amio loads.High risk for CHB due to endocarditis with preop heart block, s/p AVR and Mitral annuloplasty but she has not had any heart block over the past several days.   Volume excess: weight still above preop and has some leg edema. Will keep on oral lasix and metolazone for a few more days. Replace K+.  Continue to encourage nutrition, IS  Mobilization with PT. She may  be a good candidate for CIR if she continues to improve. She had right knee replacement in Nov, has bad left hip and deconditioned from this illness. She seems motivated to get better though. Will need to see how she progresses.   LOS: 25 days    Gaye Pollack 05/04/2018

## 2018-05-05 LAB — COMPREHENSIVE METABOLIC PANEL
ALK PHOS: 58 U/L (ref 38–126)
ALT: 8 U/L (ref 0–44)
ANION GAP: 11 (ref 5–15)
AST: 24 U/L (ref 15–41)
Albumin: 2.3 g/dL — ABNORMAL LOW (ref 3.5–5.0)
BUN: 17 mg/dL (ref 8–23)
CALCIUM: 8.1 mg/dL — AB (ref 8.9–10.3)
CO2: 32 mmol/L (ref 22–32)
Chloride: 93 mmol/L — ABNORMAL LOW (ref 98–111)
Creatinine, Ser: 0.82 mg/dL (ref 0.44–1.00)
GFR calc Af Amer: >60 mL/min (ref >60)
GFR calc non Af Amer: >60 mL/min (ref >60)
Glucose, Bld: 92 mg/dL (ref 70–99)
Potassium: 3.8 mmol/L (ref 3.5–5.1)
Sodium: 136 mmol/L (ref 135–145)
Total Bilirubin: 2.1 mg/dL — ABNORMAL HIGH (ref 0.3–1.2)
Total Protein: 5.6 g/dL — ABNORMAL LOW (ref 6.5–8.1)

## 2018-05-05 LAB — PROTIME-INR
INR: 1.59
Prothrombin Time: 18.8 seconds — ABNORMAL HIGH (ref 11.4–15.2)

## 2018-05-05 MED ORDER — FUROSEMIDE 10 MG/ML IJ SOLN
80.0000 mg | Freq: Every day | INTRAMUSCULAR | Status: DC
  Administered 2018-05-05 – 2018-05-09 (×5): 80 mg via INTRAVENOUS
  Filled 2018-05-05 (×6): qty 8

## 2018-05-05 MED ORDER — METOLAZONE 5 MG PO TABS
5.0000 mg | ORAL_TABLET | Freq: Every day | ORAL | Status: AC
  Administered 2018-05-05 – 2018-05-06 (×2): 5 mg via ORAL
  Filled 2018-05-05 (×2): qty 1

## 2018-05-05 MED ORDER — WARFARIN SODIUM 2 MG PO TABS
2.0000 mg | ORAL_TABLET | Freq: Every day | ORAL | Status: DC
  Administered 2018-05-05: 2 mg via ORAL
  Filled 2018-05-05: qty 1

## 2018-05-05 NOTE — Progress Notes (Signed)
Occupational Therapy Treatment Patient Details Name: Elizabeth [name] MRN: [id number] DOB: 1950/10/20 Today's Date: 05/05/2018    History of present illness 68 y.o. 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 During this hospitalization she had wash out of hip on 12/16 and is now s/p Median Sternotomy, Aortic valve replacement pericardial valve and MR repair with Mitral valve annuloplasty on 12/28. PMH - rt TKR (8/19), OA, HTN, lung CA   OT comments  Pt continues to demonstrate significant weakness, requiring 2 person assist for standing with stedy. Pt performed grooming at sink while in stedy with min assist. Pt with frequent bowel movements, transferred to [facility] and assisted with pericare. SNF continues to be the optimal d/c environment for rehab. Will continue to follow.  Follow Up Recommendations  SNF    Equipment Recommendations  None recommended by OT    Recommendations for Other Services      Precautions / Restrictions Precautions Precautions: Fall;Sternal Precaution Comments: educated in sternal precautions, L hip incision, pacing wires Restrictions Weight Bearing Restrictions: Yes LLE Weight Bearing: Weight bearing as tolerated       Mobility Bed Mobility Overal bed mobility: Needs Assistance Bed Mobility: Sit to Supine     Supine to sit: +2 for physical assistance;Mod assist     General bed mobility comments: assist for all aspects, cues for technique  Transfers Overall transfer level: Needs assistance Equipment used: Ambulation equipment used Transfers: Sit to/from Stand Sit to Stand: +2 physical assistance;Mod assist;Min assist         General transfer comment: +2 mod from BSC, +2 min from bed and stedy, use of momentum, cues to avoid pulling with arms    Balance  Overall balance assessment: Needs assistance Sitting-balance support: No upper extremity supported Sitting balance-Leahy Scale: Fair       Standing balance-Leahy Scale: Poor Standing balance comment: performed static standing in stedy for pericare x 1 minute                           ADL either performed or assessed with clinical judgement   ADL Overall ADL's : Needs assistance/impaired     Grooming: Wash/dry face;Oral care;Brushing hair;Minimal assistance(at sink with stedy) Grooming Details (indicate cue type and reason): pt able to lead with her R hand             Lower Body Dressing: Total assistance;Bed level(socks)   Toilet Transfer: +2 for physical assistance;Total assistance;BSC(with stedy)   Toileting- Clothing Manipulation and Hygiene: Total assistance;+2 for physical assistance;Sit to/from stand(with stedy)         General ADL Comments: pt immediately willing to work with OT     Vision       Perception     Praxis      Cognition Arousal/Alertness: Awake/alert Behavior During Therapy: WFL for tasks assessed/performed Overall Cognitive Status: Within Functional Limits for tasks assessed                                          Exercises     Shoulder Instructions       General Comments      Pertinent Vitals/ Pain       Pain Assessment: Faces Faces  Pain Scale: Hurts little more Pain Location: L knee Pain Descriptors / Indicators: Aching Pain Intervention(s): Monitored during session;Repositioned  Home Living                                          Prior Functioning/Environment              Frequency  Min 2X/week        Progress Toward Goals  OT Goals(current goals can now be found in the care plan section)  Progress towards OT goals: Progressing toward goals  Acute Rehab OT Goals Patient Stated Goal: regain independence, get to walk on her own OT Goal Formulation: With  patient Time For Goal Achievement: 05/15/18 Potential to Achieve Goals: Good  Plan Discharge plan remains appropriate    Co-evaluation                 AM-PAC OT "6 Clicks" Daily Activity     Outcome Measure   Help from another person eating meals?: None Help from another person taking care of personal grooming?: A Little Help from another person toileting, which includes using toliet, bedpan, or urinal?: Total Help from another person bathing (including washing, rinsing, drying)?: A Lot Help from another person to put on and taking off regular upper body clothing?: A Lot Help from another person to put on and taking off regular lower body clothing?: Total 6 Click Score: 13    End of Session Equipment Utilized During Treatment: Gait belt(stedy)  OT Visit Diagnosis: Pain;Unsteadiness on feet (R26.81);Muscle weakness (generalized) (M62.81)   Activity Tolerance Patient tolerated treatment well   Patient Left in chair;with call bell/phone within reach;with nursing/sitter in room   Nurse Communication Mobility status        Time: 6599-3570 OT Time Calculation (min): 58 min  Charges: OT General Charges $OT Visit: 1 Visit OT Treatments $Self Care/Home Management : 53-67 mins    Malka So 05/05/2018, 10:21 AM  Nestor Lewandowsky, OTR/L [place] Pager: [phone] Office: [phone]

## 2018-05-05 NOTE — Progress Notes (Signed)
6 Days Post-Op Procedure(s) (LRB): AORTIC VALVE REPLACEMENT (AVR) (N/A) MITRAL VALVE (MV) REPLACEMENT (N/A) Subjective: No complaints  Objective: Vital signs in last 24 hours: Temp:  [97.9 F (36.6 C)-98.6 F (37 C)] 98.6 F (37 C) (01/03 0757) Pulse Rate:  [43-96] 68 (01/03 0600) Cardiac Rhythm: Atrial flutter (01/03 0400) Resp:  [17-26] 23 (01/03 0700) BP: (96-133)/(46-62) 118/53 (01/03 0700) SpO2:  [89 %-99 %] 92 % (01/03 0600) Weight:  [121.8 kg] 121.8 kg (01/03 0433)  Hemodynamic parameters for last 24 hours:    Intake/Output from previous day: 01/02 0701 - 01/03 0700 In: 1147.8 [P.O.:720; I.V.:117.5; IV Piggyback:310.3] Out: 1100 [Urine:1100] Intake/Output this shift: No intake/output data recorded.  General appearance: alert and cooperative Neurologic: intact Heart: regular rate and rhythm, S1, S2 normal, no murmur, click, rub or gallop Lungs: clear to auscultation bilaterally Extremities: edema moderate in legs and feet Wound: incision ok  Lab Results: Recent Labs    05/03/18 0432  WBC 12.8*  HGB 10.5*  HCT 32.9*  PLT 113*   BMET:  Recent Labs    05/04/18 0331 05/05/18 0423  NA 135 136  K 3.7 3.8  CL 94* 93*  CO2 32 32  GLUCOSE 99 92  BUN 18 17  CREATININE 0.68 0.82  CALCIUM 8.0* 8.1*    PT/INR:  Recent Labs    05/05/18 0423  LABPROT 18.8*  INR 1.59   ABG    Component Value Date/Time   PHART 7.438 04/30/2018 0210   HCO3 26.0 04/30/2018 0210   TCO2 27 04/30/2018 1719   O2SAT 95.4 05/03/2018 0441   CBG (last 3)  Recent Labs    05/02/18 1237  GLUCAP 132*    Assessment/Plan: S/P Procedure(s) (LRB): AORTIC VALVE REPLACEMENT (AVR) (N/A) MITRAL VALVE (MV) REPAIR  POD 6  AVR and Mitral annuloplasty for 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ContinueAncefand Rifampinper ID rec for 6 weeks postop.  INR jumped from 1.98 to 7.75  due to vitamin K def from poor po intake preop, amiodarone for atrial fib, and rifampin. INR decreased to 2.57 after 1 mg vitamin K. She needs to be therapeutic on Coumadin with AVR, MV ring and atrial fib. Use of amio and Rifampin will make anticoagulation complicated but I agree that Rifampin is needed and amio seems to be controlling her atrial flutter and may help get back to sinus. INR down to 1.59 this am and she received 1 mg Coumadin yesterday. Will increase to 2 mg and follow..  Hemodynamically stable off milrinone.  Hasremained in atrial flutter with vent rate 68 onIV amio. I rapid atrial paced into sinus 90's multiple times over the past week and it did not hold for more than a minute. Continue amio 200 mg daily. Pacer onVVIBackup 50.Hopefully will resume sinusas amio loads.High risk for CHB due to endocarditis with preop heart block, s/p AVR and Mitral annuloplasty but she has not had any heart block this week.   Volume excess: weight still above preop and has some leg edema. Will keep on IV lasix and metolazone for a few more days. Replace K+.  Continue to encourage nutrition, IS  Mobilization with PT. Not sure that she can improve enough to be a CIR candidate. Still requires 3 people to get her out of bed.  She had right knee replacement in Nov, has bad left hip and deconditioned from this illness. She seems motivated to get better though. Will  need to see how she progresses. Resolving edema will also help with mobilization.   LOS: 26 days    Gaye Pollack 05/05/2018

## 2018-05-05 NOTE — Progress Notes (Signed)
Progress Note  Patient Name: Elizabeth [name] Date of Encounter: 05/05/2018  Primary Cardiologist: Skeet Latch, MD   Subjective   In bed this AM, sitting in chair continues to hurt her. Feels more swollen today. Pain overall well controlled, though having some knee pain with repositioning.  Inpatient Medications    Scheduled Meds: . amiodarone  200 mg Oral Daily  . Chlorhexidine Gluconate Cloth  6 each Topical Daily  . docusate sodium  200 mg Oral Daily  . feeding supplement (ENSURE ENLIVE)  237 mL Oral BID BM  . furosemide  40 mg Oral Daily  . guaiFENesin  600 mg Oral BID  . metolazone  2.5 mg Oral Daily  . pantoprazole  40 mg Oral Daily  . potassium chloride  20 mEq Oral TID  . rifampin  300 mg Oral Q12H  . sodium chloride flush  10-40 mL Intracatheter Q12H  . sodium chloride flush  3 mL Intravenous Q12H  . warfarin  1 mg Oral q1800  . Warfarin - Physician Dosing Inpatient   Does not apply q1800   Continuous Infusions: . sodium chloride Stopped (04/30/18 1844)  .  ceFAZolin (ANCEF) IV 2 g (05/05/18 0121)  . lactated ringers 10 mL/hr at 05/04/18 1900   PRN Meds: ALPRAZolam, morphine injection, ondansetron (ZOFRAN) IV, oxyCODONE, potassium chloride, sodium chloride flush, sodium chloride flush, traMADol   Vital Signs    Vitals:   05/05/18 0433 05/05/18 0500 05/05/18 0516 05/05/18 0600  BP:  (!) 110/46  (!) 99/48  Pulse:  75 74 68  Resp:  18 (!) 24 (!) 22  Temp:      TempSrc:      SpO2:  90% 90% 92%  Weight: 121.8 kg     Height:        Intake/Output Summary (Last 24 hours) at 05/05/2018 0735 Last data filed at 05/05/2018 0400 Gross per 24 hour  Intake 1147.8 ml  Output 1100 ml  Net 47.8 ml   Filed Weights   05/03/18 0500 05/04/18 0500 05/05/18 0433  Weight: 109.5 kg 117.7 kg 121.8 kg    Telemetry    Predominantly atrial flutter, very little pacing required. Personally Reviewed  ECG    Atrial flutter 1/1 - Personally Reviewed  Physical Exam    GEN: No acute distress.   Neck: JVD at mid neck lying in bed Cardiac: largely regular with occasional early beats, no murmurs, rubs, or gallops.  Respiratory: clear upper, coarse at lower fields GI: Soft, nontender, non-distended. +BS MS: 2+ bilateral LE edema, L somewhat >R Neuro:  Nonfocal  Psych: Normal affect   Labs    Chemistry Recent Labs  Lab 05/02/18 0647 05/03/18 0432 05/04/18 0331 05/05/18 0423  NA 135 133* 135 136  K 3.8 3.4* 3.7 3.8  CL 98 92* 94* 93*  CO2 26 32 32 32  GLUCOSE 109* 147* 99 92  BUN 20 21 18 17   CREATININE 0.87 0.85 0.68 0.82  CALCIUM 8.1* 8.0* 8.0* 8.1*  PROT 5.8*  --   --  5.6*  ALBUMIN 2.8*  --   --  2.3*  AST 153*  --   --  24  ALT 55*  --   --  8  ALKPHOS 94  --   --  58  BILITOT 2.6*  --   --  2.1*  GFRNONAA >60 >60 >60 >60  GFRAA >60 >60 >60 >60  ANIONGAP 11 9 9 11      Hematology Recent Labs  Lab  05/01/18 0437 05/02/18 0647 05/03/18 0432  WBC 13.4* 15.7* 12.8*  RBC 3.33* 3.37* 3.60*  HGB 9.6* 10.0* 10.5*  HCT 31.2* 31.1* 32.9*  MCV 93.7 92.3 91.4  MCH 28.8 29.7 29.2  MCHC 30.8 32.2 31.9  RDW 17.5* 17.6* 17.5*  PLT 63* 87* 113*    Cardiac EnzymesNo results for input(s): TROPONINI in the last 168 hours. No results for input(s): TROPIPOC in the last 168 hours.   BNPNo results for input(s): BNP, PROBNP in the last 168 hours.   DDimer No results for input(s): DDIMER in the last 168 hours.   Radiology    No results found.  Cardiac Studies   TTE 04/24/18 - Left ventricle: Wall thickness was increased in a pattern of mild   LVH. Left ventricular diastolic function parameters were normal. - Aortic valve: Nodular calcification of the leaflet tips with   severe AS. There was severe stenosis. There was mild   regurgitation. Valve area (VTI): 0.73 cm^2. Valve area (Vmax):   0.69 cm^2. Valve area (Vmean): 0.74 cm^2. - Mitral valve: There was moderate regurgitation. - Left atrium: The atrium was moderately dilated. -  Right atrium: The atrium was mildly dilated. - Tricuspid valve: There was moderate regurgitation. - Pulmonary arteries: PA peak pressure: 56 mm Hg (S).  04/18/18 R/LHC  Ost RCA to Prox RCA lesion is 50% stenosed.  Mid RCA lesion is 20% stenosed.   Findings:  Ao = 95/40 (63)  LV = 123/26 RA =  12 RV = 62/16 PA = 61/28 (38) PCW = 22 Fick cardiac output/index = 5.33/2.5 PVR = 3.0 WU Ao sat = 94% PA sat = 43%, 47%  Assessment: 1. 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 Elevated biventricular pressures 3. Cardiac output is normal however PA sat is quite low at 45% 4. 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 Patient developed atrial flutter in the 50s at the end of the case.  TEE 04/17/18 There was moderate central mitral regurgitation.  ERO by PISA was only 0.14 cm^2 but visually 3+ MR.  There did not appear to be a mitral valve vegetation.  No systolic flow reversal in the pulmonary vein doppler pattern.  The aortic valve was trileaflet with a large vegetation that appeared to involve all leaflets.  There did appear to be be perforation.  There appeared to be severe, eccentric aortic insufficiency with holodiastolic flow reversal in the ascending thoracic aorta.  Mean gradient across the aortic valve was 32 mmHg, suggesting moderate aortic stenosis (elevated gradient was likely due to a combination of high flow from severe AI and leaflet impingement from bulky vegetation.   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  3+ mitral regurgitation without vegetation noted.   OR TEE 04/19/18  Left atrium: Normal size, no evidence of LAA thrombus with PWD velocities > 40 mm/s in LAA  Left ventricle: Normal chamber size and systolic function, LVEF 83-38%, no significant WMAs noted. Mild concentric LVH.  Aortic valve: The valve is bicuspid with fusion of the L & R  coronary leaflets. Severe valve calcification and thickening present. Severely decreased leaflet motion. Severe stenosis (Peak > 60mmhg, Mean > 30-62mmhg). Severe AI. Abscess vs false tract near the R coronary cusp with flow throughout cardiac cycle. Cannot rule out perivalvular pathology vs defect at proximal aspect of R coronary leaflet  Mitral valve: Moderate to severe regurgitation. Normal leaflet motion,  unable to coapt likely due to dilation of the LV.  Right ventricle: Normal cavity size, wall thickness and ejection fraction. No thrombus present. No mass present.  Tricuspid valve: Mild to moderate regurgitation with central jet..  Pulmonic Valve: No insufficiency seen on color doppler.  Atrial Septum: no evidence of PFO or ASD on color flow doppler.  Aorta: Minimal calcification of the descending aorta and aortic arch, no evidence of dissection.  Pericardium: no significant pericardial effusion.   Post Bypass:  Tricuspid, Pulmonic valve unchanged. Mitral valve repaired with 28 mm Memo 4D ring, trivial MR noted. Aortic valve replaced with 19 mm Edwards Inspiris Resilia Tissue Valve, no perivalvular leak noted. Valve appears to be well seated with no rocking noted. AV gradient normal. Former abscess cavity contains flow does not appear to traverse the new valve. RV function mildly reduced. LV function preserved. LV cavity and wall function unchanged  (CO>3.2, CI>1.7). No aortic dissection noted after cannula removed.    Patient Profile     68 y.o. female with staph lugdenensis bacteremia and mitral/aortic valve endocarditis, likely 2/2 septic hip, now s/p AVR and MV repair  Assessment & Plan    Endocarditis of aortic and mitral valves, now s/p AVR and MV repair 04/29/18 (19 mm Edwards Inspiris resilia pericardial AVR, 28 mm sorin memo 4D MV ring) -on ancef and rifampin per ID -LFTs being followed  Atrial flutter/fibrillation, with episodes of bradycardia/heart block: on  amiodarone -pacing burden very minimal now and has had some fast conduction. Optimistic that no pacemaker will be needed permanently. She is orally loading on amiodarone. Once more euvolemic, could try to rapid A pace into sinus again. If she remains in flutter, would attempt cardioversion prior to discharge (though the more amiodarone she has in, the better, so would do it near discharge). -on warfarin for anticoagulation. INR has been labile (1.59 today, peak 7.75), likely 2/2 nutrition status, rifampin, and amiodarone concurrent use. If INR remains difficult to manage, could consider DOAC though this is less well studied that warfarin after bioprosthetic valve replacement and should be used only if warfarin dosing is diffiult. -should be on aspirin 81 mg for valve, will add once INR stable  Acute on chronic diastolic heart failure -off drips -admission weight 111.5 kg, weight today 121.8. Weights have been highly labile and don't seem to match with I/O. -appears more volume up with oral lasix. Will change back to IV lasix 80 mg, continue metolazone -Cr normal  CAD: nonobstructive, medical management -will be back on aspirin 81 mg once INR stable -would like statin but given rifampin and amiodarone, will hold on starting  Overall very deconditioned. Expect that she will need significant physical therapy to recover strength.   For questions or updates, please contact [place] Please consult [email or web address] for contact info under   TIME SPENT WITH PATIENT: 15 minutes of direct patient care. More than 50% of that time was spent on coordination of care and counseling regarding progression post op and plans for management.  Buford Dresser, MD, PhD [facility]     Signed, Buford Dresser, MD  05/05/2018, 7:35 AM

## 2018-05-05 NOTE — Progress Notes (Signed)
Pharmacy Antibiotic Note  Elizabeth [name] is a 68 y.o. female admitted on 04/09/2018 with Staph aortic valve endocarditis.  Pharmacy has been consulted for Ancef dosing. Today patient is afebrile, and WBC has trended down to 12.8 on 01/01. Patient's Scr remains stable.  Plan: Continue cefazolin 2g IV q8h Cefazolin to be continued through 06/09/2018  Height: 5\' 4"  (162.6 cm) Weight: 268 lb 8.3 oz (121.8 kg) IBW/kg (Calculated) : 54.7  Temp (24hrs), Avg:98.2 F (36.8 C), Min:97.9 F (36.6 C), Max:98.6 F (37 C)  Recent Labs  Lab 04/30/18 0334 04/30/18 1712  05/01/18 0437 05/02/18 0647 05/03/18 0432 05/04/18 0331 05/05/18 0423  WBC 15.3* 10.4  --  13.4* 15.7* 12.8*  --   --   CREATININE 1.01* 0.87   < > 0.89 0.87 0.85 0.68 0.82   < > = values in this interval not displayed.    Estimated Creatinine Clearance: 84.5 mL/min (by C-G formula based on SCr of 0.82 mg/dL).    Allergies  Allergen Reactions  . Penicillins Other (See Comments)    Fever -when she was 68 yrs old Has patient had a PCN reaction causing immediate rash, facial/tongue/throat swelling, SOB or lightheadedness with hypotension: NO Has patient had a PCN reaction causing severe rash involving mucus membranes or skin necrosis: Unknown Has patient had a PCN reaction that required hospitalization: Unknown Has patient had a PCN reaction occurring within the last 10 years: No Childhood allergy If all of the above answers are "NO", then may proceed with Cephalosporin use.     Antimicrobials this admission: Cefazolin 12/9 >> (06/09/2018) Rifampin 12/30 >>  Microbiology results: 12/28 tissue: no growth (final) 12/16 synovial fluid cx ngtd 12/10 blood cx ngtd 12/9 synovial fluid staph ludg 12/8 blood cx staph ludg  Thank you for allowing pharmacy to be a part of this patient's care.  Leron Croak, PharmD PGY1 Pharmacy Resident Phone: [phone]  Please check AMION for all [place] phone  numbers 05/05/2018 12:35 PM

## 2018-05-05 NOTE — Progress Notes (Signed)
CT surgery p.m. Rounds  Mobilized from bed to chair Diuresing well Vital signs stable

## 2018-05-06 LAB — CBC
HCT: 33.3 % — ABNORMAL LOW (ref 36.0–46.0)
HEMOGLOBIN: 10 g/dL — AB (ref 12.0–15.0)
MCH: 28.4 pg (ref 26.0–34.0)
MCHC: 30 g/dL (ref 30.0–36.0)
MCV: 94.6 fL (ref 80.0–100.0)
Platelets: 146 10*3/uL — ABNORMAL LOW (ref 150–400)
RBC: 3.52 MIL/uL — ABNORMAL LOW (ref 3.87–5.11)
RDW: 18.4 % — ABNORMAL HIGH (ref 11.5–15.5)
WBC: 10.1 10*3/uL (ref 4.0–10.5)
nRBC: 0 % (ref 0.0–0.2)

## 2018-05-06 LAB — BASIC METABOLIC PANEL
Anion gap: 10 (ref 5–15)
BUN: 16 mg/dL (ref 8–23)
CO2: 31 mmol/L (ref 22–32)
Calcium: 7.8 mg/dL — ABNORMAL LOW (ref 8.9–10.3)
Chloride: 95 mmol/L — ABNORMAL LOW (ref 98–111)
Creatinine, Ser: 0.83 mg/dL (ref 0.44–1.00)
GFR calc Af Amer: >60 mL/min (ref >60)
GFR calc non Af Amer: >60 mL/min (ref >60)
Glucose, Bld: 94 mg/dL (ref 70–99)
Potassium: 3.2 mmol/L — ABNORMAL LOW (ref 3.5–5.1)
Sodium: 136 mmol/L (ref 135–145)

## 2018-05-06 LAB — PROTIME-INR
INR: 1.49
Prothrombin Time: 17.8 seconds — ABNORMAL HIGH (ref 11.4–15.2)

## 2018-05-06 MED ORDER — ALTEPLASE 2 MG IJ SOLR
2.0000 mg | Freq: Once | INTRAMUSCULAR | Status: AC
  Administered 2018-05-06: 2 mg
  Filled 2018-05-06: qty 2

## 2018-05-06 MED ORDER — FUROSEMIDE 40 MG PO TABS: 40.0000 mg | ORAL_TABLET | Freq: Every day | ORAL | Status: DC

## 2018-05-06 MED ORDER — METOPROLOL TARTRATE 5 MG/5ML IV SOLN
5.0000 mg | Freq: Once | INTRAVENOUS | Status: AC
  Administered 2018-05-06: 5 mg via INTRAVENOUS
  Filled 2018-05-06: qty 5

## 2018-05-06 MED ORDER — WARFARIN SODIUM 2.5 MG PO TABS
2.5000 mg | ORAL_TABLET | Freq: Every day | ORAL | Status: DC
  Administered 2018-05-06: 2.5 mg via ORAL
  Filled 2018-05-06 (×2): qty 1

## 2018-05-06 MED ORDER — AMIODARONE HCL 200 MG PO TABS
200.0000 mg | ORAL_TABLET | Freq: Once | ORAL | Status: AC
  Administered 2018-05-06: 200 mg via ORAL
  Filled 2018-05-06: qty 1

## 2018-05-06 MED ORDER — POTASSIUM CHLORIDE CRYS ER 20 MEQ PO TBCR
40.0000 meq | EXTENDED_RELEASE_TABLET | Freq: Two times a day (BID) | ORAL | Status: DC
  Administered 2018-05-06 – 2018-05-09 (×8): 40 meq via ORAL
  Filled 2018-05-06 (×8): qty 2

## 2018-05-06 NOTE — Progress Notes (Signed)
Pt went into SVT rate 130, EKG confirmed, contacted Dr.Van Kerby Less, he ordered PO amio 200mg  and 5mg  IV lopressor, if HR doesn't decrease in 62min give another dose of 5mg  IV Lopressor.  Will continue to monitor.

## 2018-05-06 NOTE — Progress Notes (Signed)
CT surgery p.m. Rounds  Patient examined and record reviewed.Hemodynamics stable,labs satisfactory.Patient had stable day.Continue current care. Elizabeth [name] 05/06/2018

## 2018-05-06 NOTE — Plan of Care (Signed)
  Problem: Education: Goal: Will demonstrate proper wound care and an understanding of methods to prevent future damage Outcome: Progressing Goal: Knowledge of disease or condition will improve Outcome: Progressing Goal: Knowledge of the prescribed therapeutic regimen will improve Outcome: Progressing Goal: Individualized Educational Video(s) Outcome: Progressing   Problem: Activity: Goal: Risk for activity intolerance will decrease Outcome: Progressing Note:  Remains slow to progress in regards to mobility. PT working with patient    Problem: Cardiac: Goal: Will achieve and/or maintain hemodynamic stability Outcome: Progressing   Problem: Clinical Measurements: Goal: Postoperative complications will be avoided or minimized Outcome: Progressing   Problem: Skin Integrity: Goal: Wound healing without signs and symptoms of infection Outcome: Progressing Goal: Risk for impaired skin integrity will decrease Outcome: Progressing   Problem: Urinary Elimination: Goal: Ability to achieve and maintain adequate renal perfusion and functioning will improve Outcome: Progressing

## 2018-05-06 NOTE — Progress Notes (Signed)
7 Days Post-Op Procedure(s) (LRB): AORTIC VALVE REPLACEMENT (AVR) (N/A) MITRAL VALVE (MV) REPLACEMENT (N/A) Subjective: C/o L hip pain Walked in hall this am remains a-flutter Objective: Vital signs in last 24 hours: Temp:  [97.8 F (36.6 C)-98.9 F (37.2 C)] 97.8 F (36.6 C) (01/04 0700) Pulse Rate:  [70-102] 100 (01/04 0700) Cardiac Rhythm: (P) Atrial flutter (01/04 0815) Resp:  [20-28] 26 (01/04 0700) BP: (89-113)/(48-61) 99/61 (01/04 0700) SpO2:  [89 %-97 %] 89 % (01/04 0700) Weight:  [113.8 kg] 113.8 kg (01/04 0500)  Hemodynamic parameters for last 24 hours:  stable  Intake/Output from previous day: 01/03 0701 - 01/04 0700 In: 563.7 [P.O.:240; I.V.:123.5; IV Piggyback:200.2] Out: 3000 [Urine:3000] Intake/Output this shift: Total I/O In: 480 [P.O.:480] Out: -  Lungs clear no murmur Sternal incision healing L hip incision with erythema, macerated skin fold  Lab Results: Recent Labs    05/06/18 0609  WBC 10.1  HGB 10.0*  HCT 33.3*  PLT 146*   BMET:  Recent Labs    05/05/18 0423 05/06/18 0609  NA 136 136  K 3.8 3.2*  CL 93* 95*  CO2 32 31  GLUCOSE 92 94  BUN 17 16  CREATININE 0.82 0.83  CALCIUM 8.1* 7.8*    PT/INR:  Recent Labs    05/06/18 0609  LABPROT 17.8*  INR 1.49   ABG    Component Value Date/Time   PHART 7.438 04/30/2018 0210   HCO3 26.0 04/30/2018 0210   TCO2 27 04/30/2018 1719   O2SAT 95.4 05/03/2018 0441   CBG (last 3)  No results for input(s): GLUCAP in the last 72 hours.  Assessment/Plan: S/P Procedure(s) (LRB): AORTIC VALVE REPLACEMENT (AVR) (N/A) MITRAL VALVE (MV) REPLACEMENT (N/A) Increase coumadin dose Wound care nurse cont mobilization CIR next week   LOS: 27 days    Elizabeth [name] 05/06/2018

## 2018-05-07 LAB — CBC
HCT: 31.7 % — ABNORMAL LOW (ref 36.0–46.0)
Hemoglobin: 9.5 g/dL — ABNORMAL LOW (ref 12.0–15.0)
MCH: 28.6 pg (ref 26.0–34.0)
MCHC: 30 g/dL (ref 30.0–36.0)
MCV: 95.5 fL (ref 80.0–100.0)
Platelets: 178 10*3/uL (ref 150–400)
RBC: 3.32 MIL/uL — ABNORMAL LOW (ref 3.87–5.11)
RDW: 18.1 % — ABNORMAL HIGH (ref 11.5–15.5)
WBC: 10.9 10*3/uL — ABNORMAL HIGH (ref 4.0–10.5)
nRBC: 0 % (ref 0.0–0.2)

## 2018-05-07 LAB — BASIC METABOLIC PANEL
Anion gap: 10 (ref 5–15)
BUN: 18 mg/dL (ref 8–23)
CO2: 33 mmol/L — ABNORMAL HIGH (ref 22–32)
Calcium: 8.2 mg/dL — ABNORMAL LOW (ref 8.9–10.3)
Chloride: 91 mmol/L — ABNORMAL LOW (ref 98–111)
Creatinine, Ser: 0.98 mg/dL (ref 0.44–1.00)
GFR calc Af Amer: >60 mL/min (ref >60)
GFR calc non Af Amer: 59 mL/min — ABNORMAL LOW (ref >60)
Glucose, Bld: 142 mg/dL — ABNORMAL HIGH (ref 70–99)
Potassium: 3.9 mmol/L (ref 3.5–5.1)
Sodium: 134 mmol/L — ABNORMAL LOW (ref 135–145)

## 2018-05-07 LAB — PROTIME-INR
INR: 1.81
PROTHROMBIN TIME: 20.7 s — AB (ref 11.4–15.2)

## 2018-05-07 MED ORDER — RISAQUAD PO CAPS
1.0000 | ORAL_CAPSULE | Freq: Two times a day (BID) | ORAL | Status: DC
  Administered 2018-05-07 – 2018-05-11 (×9): 1 via ORAL
  Filled 2018-05-07 (×9): qty 1

## 2018-05-07 MED ORDER — KETOROLAC TROMETHAMINE 15 MG/ML IJ SOLN: 15.0000 mg | Freq: Four times a day (QID) | INTRAMUSCULAR | Status: DC

## 2018-05-07 MED ORDER — METOPROLOL TARTRATE 5 MG/5ML IV SOLN
INTRAVENOUS | Status: AC
  Administered 2018-05-07: 5 mg via INTRAVENOUS
  Filled 2018-05-07: qty 5

## 2018-05-07 MED ORDER — AMIODARONE HCL 200 MG PO TABS: 200.0000 mg | ORAL_TABLET | Freq: Every day | ORAL | Status: DC

## 2018-05-07 MED ORDER — METOPROLOL TARTRATE 5 MG/5ML IV SOLN
5.0000 mg | Freq: Once | INTRAVENOUS | Status: AC
  Administered 2018-05-07: 5 mg via INTRAVENOUS

## 2018-05-07 MED ORDER — FLUCONAZOLE 100MG IVPB
100.0000 mg | INTRAVENOUS | Status: AC
  Administered 2018-05-07 – 2018-05-09 (×3): 100 mg via INTRAVENOUS
  Filled 2018-05-07 (×3): qty 50

## 2018-05-07 MED ORDER — PANTOPRAZOLE SODIUM 40 MG PO TBEC
40.0000 mg | DELAYED_RELEASE_TABLET | Freq: Every day | ORAL | Status: DC
  Administered 2018-05-07 – 2018-05-11 (×5): 40 mg via ORAL
  Filled 2018-05-07 (×5): qty 1

## 2018-05-07 MED ORDER — KETOROLAC TROMETHAMINE 15 MG/ML IJ SOLN
15.0000 mg | Freq: Four times a day (QID) | INTRAMUSCULAR | Status: AC
  Administered 2018-05-07 – 2018-05-08 (×4): 15 mg via INTRAVENOUS
  Filled 2018-05-07 (×4): qty 1

## 2018-05-07 MED ORDER — DULOXETINE HCL 20 MG PO CPEP
20.0000 mg | ORAL_CAPSULE | Freq: Every day | ORAL | Status: DC
  Administered 2018-05-07: 20 mg via ORAL
  Filled 2018-05-07 (×2): qty 1

## 2018-05-07 MED ORDER — METOPROLOL TARTRATE 5 MG/5ML IV SOLN: 5.0000 mg | INTRAVENOUS | Status: DC | PRN

## 2018-05-07 MED ORDER — DOCUSATE SODIUM 100 MG PO CAPS: 200.0000 mg | ORAL_CAPSULE | Freq: Every day | ORAL | Status: DC | PRN

## 2018-05-07 MED ORDER — METOPROLOL TARTRATE 25 MG PO TABS
25.0000 mg | ORAL_TABLET | Freq: Two times a day (BID) | ORAL | Status: DC
  Administered 2018-05-07: 25 mg via ORAL
  Filled 2018-05-07: qty 1

## 2018-05-07 MED ORDER — METOPROLOL TARTRATE 12.5 MG HALF TABLET
12.5000 mg | ORAL_TABLET | Freq: Two times a day (BID) | ORAL | Status: DC
  Administered 2018-05-08 – 2018-05-11 (×7): 12.5 mg via ORAL
  Filled 2018-05-07 (×8): qty 1

## 2018-05-07 MED ORDER — METOPROLOL TARTRATE 12.5 MG HALF TABLET: 12.5000 mg | ORAL_TABLET | Freq: Two times a day (BID) | ORAL | Status: DC

## 2018-05-07 MED ORDER — WARFARIN SODIUM 2 MG PO TABS
2.0000 mg | ORAL_TABLET | Freq: Every day | ORAL | Status: DC
  Administered 2018-05-07 – 2018-05-11 (×5): 2 mg via ORAL
  Filled 2018-05-07 (×4): qty 1

## 2018-05-07 NOTE — Progress Notes (Signed)
Contacted daughter multiple times to get facility choice about SNF choice. SW will facilitate discharge appropriately once bed choice provided.

## 2018-05-07 NOTE — Progress Notes (Signed)
End of shift report. Patient has denied pain since 1400. Patient received a dose of Toradol around 1300 and has helped with her pain significantly. Elizabeth [name] has not received any oxycodone this shift. She sat in the chair until 1130 ish and then transferred back to bed with hoyer lift. Patient complained of her tailbone hurting the whole time she was sitting in the chair. Hopefully, with the addition of Toradol she will tolerate better. HR has remained controlled since around 1300, but remains in A-flutter. She continues to eat all her food and drink 2 Ensures a day. Cymbalta was added today to help control her chronic pain. Her skin folds and perineal area are red, raw in some areas, and have a strong smell of yeast. Dr. Prescott Gum started her on Diflucan and a probiotic since she will need IV antibiotics for 4 more weeks. Will continue to encourage her to sit in recliner.

## 2018-05-07 NOTE — Progress Notes (Signed)
CT surgery p.m. Rounds  Patient resting comfortably, was up in chair Patient has had multiple complaints and can do only minimal activity without assistance Atrial flutter heart rate controlled 70/min on oral beta-blocker Diffuse Candida in all skin folds, will need short course of Diflucan and careful monitoring of INR

## 2018-05-07 NOTE — Progress Notes (Signed)
8 Days Post-Op Procedure(s) (LRB): AORTIC VALVE REPLACEMENT (AVR) (N/A) MITRAL VALVE (MV) REPLACEMENT (N/A) Subjective: Back in SVT Seen by cards, rec lopressor for now, may need    ablation Will start po lopressor bid and use prn iv lopressor for HR control UP in chair - BP stable Objective: Vital signs in last 24 hours: Temp:  [97.8 F (36.6 C)-98.8 F (37.1 C)] 98.5 F (36.9 C) (01/05 0757) Pulse Rate:  [45-131] 125 (01/05 0700) Cardiac Rhythm: Sinus tachycardia (01/05 0800) Resp:  [21-31] 26 (01/05 0700) BP: (91-122)/(50-77) 94/66 (01/05 0700) SpO2:  [93 %-100 %] 98 % (01/05 0700)  Hemodynamic parameters for last 24 hours:  svt afebrile  Intake/Output from previous day: 01/04 0701 - 01/05 0700 In: 901.4 [P.O.:480; IV Piggyback:421.4] Out: 900 [Urine:900] Intake/Output this shift: No intake/output data recorded.  Incisions clean Edema better  Lab Results: Recent Labs    05/06/18 0609 05/07/18 0406  WBC 10.1 10.9*  HGB 10.0* 9.5*  HCT 33.3* 31.7*  PLT 146* 178   BMET:  Recent Labs    05/06/18 0609 05/07/18 0406  NA 136 134*  K 3.2* 3.9  CL 95* 91*  CO2 31 33*  GLUCOSE 94 142*  BUN 16 18  CREATININE 0.83 0.98  CALCIUM 7.8* 8.2*    PT/INR:  Recent Labs    05/07/18 0406  LABPROT 20.7*  INR 1.81   ABG    Component Value Date/Time   PHART 7.438 04/30/2018 0210   HCO3 26.0 04/30/2018 0210   TCO2 27 04/30/2018 1719   O2SAT 95.4 05/03/2018 0441   CBG (last 3)  No results for input(s): GLUCAP in the last 72 hours.  Assessment/Plan: S/P Procedure(s) (LRB): AORTIC VALVE REPLACEMENT (AVR) (N/A) MITRAL VALVE (MV) REPLACEMENT (N/A) INR 1.8 , cont 2.5 mg coumadin Bid lopressor 25mg  po and prn iv lopressor 5mg  iv   LOS: 28 days    Tharon Aquas Trigt III 05/07/2018

## 2018-05-07 NOTE — Progress Notes (Signed)
Progress Note  Patient Name: Elizabeth [name] Date of Encounter: 05/07/2018  Primary Cardiologist: Skeet Latch, MD   Subjective   Complains of weakness.  No dyspnea or chest pain.  Inpatient Medications    Scheduled Meds: . amiodarone  200 mg Oral Daily  . Chlorhexidine Gluconate Cloth  6 each Topical Daily  . docusate sodium  200 mg Oral Daily  . feeding supplement (ENSURE ENLIVE)  237 mL Oral BID BM  . furosemide  80 mg Intravenous Daily  . guaiFENesin  600 mg Oral BID  . potassium chloride  40 mEq Oral BID  . rifampin  300 mg Oral Q12H  . sodium chloride flush  10-40 mL Intracatheter Q12H  . warfarin  2.5 mg Oral q1800  . Warfarin - Physician Dosing Inpatient   Does not apply q1800   Continuous Infusions: . sodium chloride Stopped (04/30/18 1844)  .  ceFAZolin (ANCEF) IV Stopped (05/07/18 0251)  . lactated ringers Stopped (05/05/18 0120)   PRN Meds: ALPRAZolam, morphine injection, ondansetron (ZOFRAN) IV, oxyCODONE, sodium chloride flush, traMADol   Vital Signs    Vitals:   05/07/18 0600 05/07/18 0630 05/07/18 0700 05/07/18 0757  BP: 91/60 (!) 93/56 94/66   Pulse: (!) 121 (!) 120 (!) 125   Resp: (!) 25 (!) 24 (!) 26   Temp:    98.5 F (36.9 C)  TempSrc:    Oral  SpO2: 98% 96% 98%   Weight:      Height:        Intake/Output Summary (Last 24 hours) at 05/07/2018 0932 Last data filed at 05/07/2018 0400 Gross per 24 hour  Intake 421.41 ml  Output 200 ml  Net 221.41 ml   Filed Weights   05/04/18 0500 05/05/18 0433 05/06/18 0500  Weight: 117.7 kg 121.8 kg 113.8 kg    Telemetry    Supraventricular tachycardia rate approximately 130.- Personally Reviewed  Physical Exam   GEN: No acute distress.   Neck: No JVD Cardiac:  Regular and tachycardic Respiratory: diminished BS bases GI: Soft, nontender, non-distended  MS: 1+ edema Neuro:  Nonfocal  Psych: Normal affect   Labs    Chemistry Recent Labs  Lab 05/02/18 0647  05/05/18 0423 05/06/18 0609  05/07/18 0406  NA 135   < > 136 136 134*  K 3.8   < > 3.8 3.2* 3.9  CL 98   < > 93* 95* 91*  CO2 26   < > 32 31 33*  GLUCOSE 109*   < > 92 94 142*  BUN 20   < > 17 16 18   CREATININE 0.87   < > 0.82 0.83 0.98  CALCIUM 8.1*   < > 8.1* 7.8* 8.2*  PROT 5.8*  --  5.6*  --   --   ALBUMIN 2.8*  --  2.3*  --   --   AST 153*  --  24  --   --   ALT 55*  --  8  --   --   ALKPHOS 94  --  58  --   --   BILITOT 2.6*  --  2.1*  --   --   GFRNONAA >60   < > >60 >60 59*  GFRAA >60   < > >60 >60 >60  ANIONGAP 11   < > 11 10 10    < > = values in this interval not displayed.     Hematology Recent Labs  Lab 05/03/18 [phone] 05/06/18 2248 05/07/18 0406  WBC 12.8* 10.1 10.9*  RBC 3.60* 3.52* 3.32*  HGB 10.5* 10.0* 9.5*  HCT 32.9* 33.3* 31.7*  MCV 91.4 94.6 95.5  MCH 29.2 28.4 28.6  MCHC 31.9 30.0 30.0  RDW 17.5* 18.4* 18.1*  PLT 113* 146* 178    Patient Profile     68 y.o. 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  Heart rate approximately 130.  She had incessant SVT preoperatively that was treated with IV amiodarone.  She was seen by Dr. Lovena Le and this was felt to be atrial tachycardia.  She has been fully loaded with amiodarone and we will continue at present dose.  Add metoprolol 12.5 mg twice daily.  Note blood pressure is borderline.  Also note she has had bradycardia earlier in the hospitalization and we will need to follow HR closely.  She may ultimately require ablation of her atrial tachycardia.  2 postoperative atrial flutter-patient now in SVT.  Continue amiodarone and Coumadin.  3 status post aortic valve replacement and mitral valve repair secondary to endocarditis-continue antibiotics.  4 postoperative volume excess-remains volume overloaded.  Continue IV Lasix and follow renal function.  For questions  or updates, please contact [place] Please consult [email or web address] for contact info under        Signed, Kirk Ruths, MD  05/07/2018, 9:32 AM

## 2018-05-08 LAB — PROTIME-INR
INR: 2.1
Prothrombin Time: 23.3 seconds — ABNORMAL HIGH (ref 11.4–15.2)

## 2018-05-08 LAB — BASIC METABOLIC PANEL
Anion gap: 4 — ABNORMAL LOW (ref 5–15)
BUN: 24 mg/dL — ABNORMAL HIGH (ref 8–23)
CO2: 37 mmol/L — ABNORMAL HIGH (ref 22–32)
Calcium: 8.1 mg/dL — ABNORMAL LOW (ref 8.9–10.3)
Chloride: 95 mmol/L — ABNORMAL LOW (ref 98–111)
Creatinine, Ser: 0.9 mg/dL (ref 0.44–1.00)
GFR calc Af Amer: >60 mL/min (ref >60)
GFR calc non Af Amer: >60 mL/min (ref >60)
Glucose, Bld: 105 mg/dL — ABNORMAL HIGH (ref 70–99)
Potassium: 4.3 mmol/L (ref 3.5–5.1)
Sodium: 136 mmol/L (ref 135–145)

## 2018-05-08 NOTE — Care Management Note (Signed)
Case Management Note Initial CM Note documented by Wallace  Patient Details  Name: Elizabeth [name] MRN: [id number] Date of Birth: 20-Sep-1950  Subjective/Objective:                  Subjective: 4 Days Post-Op Procedure(s) (LRB): IRRIGATION AND DEBRIDEMENT LEFT HIP (ANTERIOR) (Left) Patient reports pain as mild.   Patient seen in rounds for Dr. Wynelle Link. Patient is well this AM, states fatigue is slowly improving. Got up with therapy yesterday to the [facility] and recliner.  Action/Plan: New dressing applied. Continue working with therapy.  Disposition: SNF once medically stable.   Expected Discharge Date:                  Expected Discharge Plan:  Skilled Nursing Facility  In-House Referral:  Clinical Social Work  Discharge planning Services  CM Consult  Post Acute Care Choice:    Choice offered to:     DME Arranged:    DME Agency:     HH Arranged:    [place] Agency:     Status of Service:  In process, will continue to follow  If discussed at Long Length of Stay Meetings, dates discussed:    Additional Comments: 05/08/18 @ 0950-Alexios Keown RNCM-CM consult acknowledged. CSW is following for ST SNF placement with choice list provided by CSW and awaiting facility choice from patients daughter per progress note. CM team will continue to follow.   Midge Minium RN, BSN, NCM-BC, ACM-RN [phone] 05/08/2018, 9:51 AM

## 2018-05-08 NOTE — Progress Notes (Signed)
Patient ID: Elizabeth [name], female   DOB: 03-Jul-1950, 68 y.o.   MRN: [id number] EVENING ROUNDS NOTE :     [address]       [place] [address]             [phone]                 9 Days Post-Op Procedure(s) (LRB): AORTIC VALVE REPLACEMENT (AVR) (N/A) MITRAL VALVE (MV) REPLACEMENT (N/A)  Total Length of Stay:  LOS: 29 days  BP (!) 94/48   Pulse 65   Temp 99.1 F (37.3 C)   Resp (!) 22   Ht 5\' 4"  (1.626 m)   Wt 112.7 kg   SpO2 94%   BMI 42.65 kg/m   .Intake/Output      01/05 0701 - 01/06 0700 01/06 0701 - 01/07 0700   P.O. 1317 440   IV Piggyback 497.9 100   Total Intake(mL/kg) 1814.9 (15.9) 540 (4.8)   Urine (mL/kg/hr) 1000 (0.4) 600 (0.6)   Stool 1 0   Total Output 1001 600   Net +813.9 -60        Urine Occurrence 1 x 1 x   Stool Occurrence  1 x     . sodium chloride Stopped (04/30/18 1844)  .  ceFAZolin (ANCEF) IV 2 g (05/08/18 0920)  . fluconazole (DIFLUCAN) IV Stopped (05/07/18 1611)     Lab Results  Component Value Date   WBC 10.9 (H) 05/07/2018   HGB 9.5 (L) 05/07/2018   HCT 31.7 (L) 05/07/2018   PLT 178 05/07/2018   GLUCOSE 105 (H) 05/08/2018   CHOL 141 11/11/2017   TRIG 114 11/11/2017   HDL 35 (L) 11/11/2017   LDLCALC 85 11/11/2017   ALT 8 05/05/2018   AST 24 05/05/2018   NA 136 05/08/2018   K 4.3 05/08/2018   CL 95 (L) 05/08/2018   CREATININE 0.90 05/08/2018   BUN 24 (H) 05/08/2018   CO2 37 (H) 05/08/2018   TSH 1.031 04/12/2018   INR 2.10 05/08/2018   HGBA1C 5.5 11/23/2017   Stable day, tubes and lines out Walked 4 feet, mobility major issue now    Grace Isaac MD  Beeper [phone] Office [phone] 05/08/2018 4:26 PM

## 2018-05-08 NOTE — Progress Notes (Addendum)
TCTS DAILY ICU PROGRESS NOTE                   [address]            [place] [address]          [phone]   9 Days Post-Op Procedure(s) (LRB): AORTIC VALVE REPLACEMENT (AVR) (N/A) MITRAL VALVE (MV) REPLACEMENT (N/A)  Total Length of Stay:  LOS: 29 days   Subjective:  No new complaints.  She is worn out trying to get up and out of bed.    Objective: Vital signs in last 24 hours: Temp:  [97.7 F (36.5 C)-98.4 F (36.9 C)] 98.4 F (36.9 C) (01/05 2000) Pulse Rate:  [53-129] 65 (01/06 0600) Cardiac Rhythm: Atrial flutter (01/06 0400) Resp:  [20-27] 24 (01/06 0600) BP: (85-109)/(46-66) 89/60 (01/06 0600) SpO2:  [92 %-98 %] 94 % (01/06 0600)  Filed Weights   05/04/18 0500 05/05/18 0433 05/06/18 0500  Weight: 117.7 kg 121.8 kg 113.8 kg    Weight change:    Intake/Output from previous day: 01/05 0701 - 01/06 0700 In: 1714.9 [P.O.:1317; IV Piggyback:397.9] Out: 1001 [Urine:1000; Stool:1]  Current Meds: Scheduled Meds: . acidophilus  1 capsule Oral BID  . amiodarone  200 mg Oral Daily  . Chlorhexidine Gluconate Cloth  6 each Topical Daily  . DULoxetine  20 mg Oral Daily  . feeding supplement (ENSURE ENLIVE)  237 mL Oral BID BM  . furosemide  80 mg Intravenous Daily  . guaiFENesin  600 mg Oral BID  . metoprolol tartrate  12.5 mg Oral BID  . pantoprazole  40 mg Oral Q1200  . potassium chloride  40 mEq Oral BID  . rifampin  300 mg Oral Q12H  . sodium chloride flush  10-40 mL Intracatheter Q12H  . warfarin  2 mg Oral q1800  . Warfarin - Physician Dosing Inpatient   Does not apply q1800   Continuous Infusions: . sodium chloride Stopped (04/30/18 1844)  .  ceFAZolin (ANCEF) IV 2 g (05/08/18 0200)  . fluconazole (DIFLUCAN) IV Stopped (05/07/18 1611)   PRN Meds:.ALPRAZolam, docusate sodium, metoprolol tartrate, ondansetron (ZOFRAN) IV, oxyCODONE, sodium chloride flush, traMADol  General appearance: alert, cooperative and no distress Heart:  regular rate and rhythm Lungs: clear to auscultation bilaterally Abdomen: soft, non-tender; bowel sounds normal; no masses,  no organomegaly Extremities: edema trace Wound: clean and dry  Lab Results: CBC: Recent Labs    05/06/18 0609 05/07/18 0406  WBC 10.1 10.9*  HGB 10.0* 9.5*  HCT 33.3* 31.7*  PLT 146* 178   BMET:  Recent Labs    05/07/18 0406 05/08/18 0308  NA 134* 136  K 3.9 4.3  CL 91* 95*  CO2 33* 37*  GLUCOSE 142* 105*  BUN 18 24*  CREATININE 0.98 0.90  CALCIUM 8.2* 8.1*    CMET: Lab Results  Component Value Date   WBC 10.9 (H) 05/07/2018   HGB 9.5 (L) 05/07/2018   HCT 31.7 (L) 05/07/2018   PLT 178 05/07/2018   GLUCOSE 105 (H) 05/08/2018   CHOL 141 11/11/2017   TRIG 114 11/11/2017   HDL 35 (L) 11/11/2017   LDLCALC 85 11/11/2017   ALT 8 05/05/2018   AST 24 05/05/2018   NA 136 05/08/2018   K 4.3 05/08/2018   CL 95 (L) 05/08/2018   CREATININE 0.90 05/08/2018   BUN 24 (H) 05/08/2018   CO2 37 (H) 05/08/2018   TSH 1.031 04/12/2018   INR 2.10 05/08/2018  HGBA1C 5.5 11/23/2017      PT/INR:  Recent Labs    05/08/18 0308  LABPROT 23.3*  INR 2.10   Radiology: No results found.   Assessment/Plan: S/P Procedure(s) (LRB): AORTIC VALVE REPLACEMENT (AVR) (N/A) MITRAL VALVE (MV) REPLACEMENT (N/A)  1. CV- SVT, BP remains low-on Amiodarone, Lopressor at 12.5 mg BID, but her BP will not tolerate increase in Lopressor due to blood pressure 2. INR is therapeutic at 2.10, will continue low dose at 2 mg daily 3. Pulm- wean oxygen as tolerated, continue IS 4. Renal- creatinine WNL, weight is trending down, on IV Lasix 5. Deconditioning- needs aggressive PT/OT 6. Dispo- patient stable, remains in SVT, will need to get wires out as INR is therapeutic, will continue low dose coumadin, continue aggressive PT/OT, OR cultures   Ellwood Handler 05/08/2018 8:27 AM    Chart reviewed, patient examined, agree with above. Main issue at this point is debilitation  and deconditioning due to morbid obesity, recent right TKR, left hip septic arthritis and several week hospitalization and major heart surgery for endocarditis. It is going to be a long course getting her moving again. Need to work on SNF placement. She is still edematous and weights in bed have been all over the place so hard to tell if she is diuresing adequately. Continue IV lasix and Kdur.  Encourage nutrition. DC pacing wires.

## 2018-05-08 NOTE — Progress Notes (Signed)
Physical Therapy Treatment Patient Details Name: Elizabeth [name] MRN: [id number] DOB: 05-May-1950 Today's Date: 05/08/2018    History of Present Illness 68 y.o. 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 During this hospitalization she had wash out of hip on 12/16 and is now s/p Median Sternotomy, Aortic valve replacement pericardial valve and MR repair with Mitral valve annuloplasty on 12/28. PMH - rt TKR (8/19), OA, HTN, lung CA    PT Comments    Pt making steady progress. Able to amb short distance in the room.    Follow Up Recommendations  SNF     Equipment Recommendations  Other (comment)(tbd)    Recommendations for Other Services       Precautions / Restrictions Precautions Precautions: Fall;Knee Precaution Comments: left ant. hip incision; pt fatigues very easily    Mobility  Bed Mobility Overal bed mobility: Needs Assistance Bed Mobility: Supine to Sit     Supine to sit: +2 for physical assistance;Mod assist Sit to supine: +2 for physical assistance;Max assist   General bed mobility comments: Assist to bring legs off of bed, elevate trunk into sitting, and bring hips to EOB. Assist to lower trunk and bring legs back up into bed.  Transfers Overall transfer level: Needs assistance Equipment used: Rolling walker (2 wheeled);Ambulation equipment used Transfers: Sit to/from [facility] Sit to Stand: +2 physical assistance;Min assist Stand pivot transfers: +2 physical assistance;Min assist       General transfer comment: Assist to bring hips up. Pt stood from bed x 2 and from bsc   Ambulation/Gait Ambulation/Gait assistance: +2 physical assistance;Min assist Gait Distance (Feet): 5 Feet Assistive device: Rolling walker (2 wheeled) Gait Pattern/deviations:  Step-to pattern;Decreased step length - right;Decreased step length - left;Shuffle;Trunk flexed Gait velocity: decr Gait velocity interpretation: <1.31 ft/sec, indicative of household ambulator General Gait Details: Assist for balance and support.   Stairs             Wheelchair Mobility    Modified Rankin (Stroke Patients Only)       Balance Overall balance assessment: Needs assistance Sitting-balance support: Single extremity supported;Feet supported Sitting balance-Leahy Scale: Poor     Standing balance support: Bilateral upper extremity supported Standing balance-Leahy Scale: Poor Standing balance comment: Walker or stedy and min assist                            Cognition Arousal/Alertness: Awake/alert Behavior During Therapy: WFL for tasks assessed/performed Overall Cognitive Status: Within Functional Limits for tasks assessed                                        Exercises      General Comments        Pertinent Vitals/Pain Pain Assessment: No/denies pain    Home Living                      Prior Function            PT Goals (current goals can now be found in the care plan section) Progress towards PT goals: Progressing toward goals    Frequency    Min 2X/week      PT Plan Current plan  remains appropriate    Co-evaluation              AM-PAC PT "6 Clicks" Mobility   Outcome Measure  Help needed turning from your back to your side while in a flat bed without using bedrails?: A Lot Help needed moving from lying on your back to sitting on the side of a flat bed without using bedrails?: A Lot Help needed moving to and from a bed to a chair (including a wheelchair)?: A Lot Help needed standing up from a chair using your arms (e.g., wheelchair or bedside chair)?: A Little Help needed to walk in hospital room?: A Lot Help needed climbing 3-5 steps with a railing? : Total 6 Click Score: 12     End of Session Equipment Utilized During Treatment: Gait belt Activity Tolerance: Patient tolerated treatment well Patient left: with call bell/phone within reach;in bed Nurse Communication: Mobility status;Need for lift equipment(nurse assisted with transfer) PT Visit Diagnosis: Unsteadiness on feet (R26.81)     Time: 2297-9892 PT Time Calculation (min) (ACUTE ONLY): 39 min  Charges:  $Therapeutic Activity: 23-37 mins                     [place] Pager [phone] Office [place] 05/08/2018, 2:10 PM

## 2018-05-08 NOTE — Discharge Summary (Addendum)
Physician Discharge Summary  Patient ID: Elizabeth [name] MRN: [id number] DOB/AGE: May 01, 1951 68 y.o.  Admit date: 04/09/2018 Discharge date: 05/11/2018  Admission Diagnoses:  Patient Active Problem List   Diagnosis Date Noted  . Abdominal pain   . Typical atrial flutter ([place])   . Acute pulmonary edema (HCC)   . Acute respiratory failure ([place])   . Cardiac arrest, cause unspecified ([place])   . Cardiogenic shock ([place])   . Atrioventricular block, complete ([place])   . Severe aortic regurgitation   . SVT (supraventricular tachycardia) ([place])   . 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 Infective endocarditis   . Thrombocytopenia ([place])   . Anemia of chronic disease   . Chest tightness   . Fever   . Pressure injury of skin 04/10/2018  . Septic arthritis ([place]) 04/09/2018  . Acute kidney injury (AKI) with acute tubular necrosis (ATN) (HCC)   . Arterial hypotension   . Sepsis with acute organ dysfunction and septic shock (HCC)   . OA (osteoarthritis) of knee 12/12/2017  . Idiopathic chronic venous hypertension of both lower extremities with inflammation 06/29/2016  . Subtalar joint instability, left 06/29/2016  . Arthritis of right knee 01/30/2016  . Lower extremity edema 10/21/2015  . Leg cramps 08/21/2015  . Left ankle pain 04/09/2015  . Anxiety 01/20/2015  . Nonrheumatic aortic insufficiency with aortic stenosis 01/08/2015  . Obesity, Class III, BMI 40-49.9 (morbid obesity) ([place]) 01/08/2015  . Varicose veins 01/02/2015  . Snoring 12/06/2014  . Viral gastroenteritis 04/06/2013  . Insect bite of ankle, right, infected 07/31/2012  . Primary cancer of right upper lobe of lung ([place]) 10/18/2011  . Left hip pain 06/29/2011  . Pain in joint, lower leg 03/17/2009  . GASTROENTERITIS 07/11/2008  . PULMONARY NODULE, SOLITARY 04/18/2008  . LIVER FUNCTION TESTS, ABNORMAL, HX OF 04/01/2008  . B12 DEFICIENCY 11/13/2007  . COUGH 11/13/2007  . UNSPECIFIED VITAMIN D DEFICIENCY 11/07/2007  . TINEA  PEDIS 07/07/2007  . LYMPHADENOPATHY 11/16/2006  . Hyperlipidemia 09/15/2006  . Essential hypertension 09/15/2006  . LOW BACK PAIN 09/15/2006   Discharge Diagnoses:   Patient Active Problem List   Diagnosis Date Noted  . Benign essential HTN   . History of lung cancer   . Tachypnea   . Leukocytosis   . Acute blood loss anemia   . Staphylococcal sepsis ([place])   . S/P AVR (aortic valve replacement) 04/29/2018  . S/P MVR (mitral valve repair) 04/29/2018  . Abdominal pain   . Typical atrial flutter ([place])   . Acute pulmonary edema (HCC)   . Acute respiratory failure ([place])   . Cardiac arrest, cause unspecified ([place])   . Cardiogenic shock ([place])   . Atrioventricular block, complete ([place])   . Severe aortic regurgitation   . SVT (supraventricular tachycardia) ([place])   . 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 Infective endocarditis   . Thrombocytopenia ([place])   . Anemia of chronic disease   . Chest tightness   . Fever   . Pressure injury of skin 04/10/2018  . Septic arthritis ([place]) 04/09/2018  . Acute kidney injury (AKI) with acute tubular necrosis (ATN) (HCC)   . Arterial hypotension   . Sepsis with acute organ dysfunction and septic shock (HCC)   . OA (osteoarthritis) of knee 12/12/2017  . Idiopathic chronic venous hypertension of both lower extremities with inflammation 06/29/2016  . Subtalar joint instability, left 06/29/2016  . Arthritis of right knee 01/30/2016  . Lower extremity edema 10/21/2015  . Leg cramps  08/21/2015  . Left ankle pain 04/09/2015  . Anxiety 01/20/2015  . Nonrheumatic aortic insufficiency with aortic stenosis 01/08/2015  . Obesity, Class III, BMI 40-49.9 (morbid obesity) ([place]) 01/08/2015  . Varicose veins 01/02/2015  . Snoring 12/06/2014  . Viral gastroenteritis 04/06/2013  . Insect bite of ankle, right, infected 07/31/2012  . Primary cancer of right upper lobe of lung ([place]) 10/18/2011  . Left hip pain 06/29/2011  . Pain in joint, lower leg 03/17/2009   . GASTROENTERITIS 07/11/2008  . PULMONARY NODULE, SOLITARY 04/18/2008  . LIVER FUNCTION TESTS, ABNORMAL, HX OF 04/01/2008  . B12 DEFICIENCY 11/13/2007  . COUGH 11/13/2007  . UNSPECIFIED VITAMIN D DEFICIENCY 11/07/2007  . TINEA PEDIS 07/07/2007  . LYMPHADENOPATHY 11/16/2006  . Hyperlipidemia 09/15/2006  . Essential hypertension 09/15/2006  . LOW BACK PAIN 09/15/2006   Discharged Condition: good  History of Present Illness:  Mrs. Stenzel is a 68 yo morbidly obese white female with history of mild aortic stenosis, hypertension, hyperlipidemia, and H/O lung cancer S/P lobectomy by Dr. Arlyce Dice in 2010.  She recently had a total knee replacement in August 2019.  She has been having her left hip injected with steroids at [facility].  This was done in the middle of November.  She went on a cruise with her family but ended up having a miserable time and barely made it back home.  Then she developed a fever and felt poorly for a week.  She presented to the ED on 04/09/2018 at which time she was found to be febrile at 105 and was hypotensive.  She had positive blood cultures for Staph Lugdenesis.  Echocardiogram was obtained and showed a calcific aortic valve with possible vegetation.  She was also diagnosed with septic arthritis of her left hip.  On 04/17/2018 she underwent irrigation and debridement in the operating room.  Cultures were obtained and grew the same bacteria.  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  EF was preserved.  She was treated with antibiotics as recommended by infectious disease and cared for by cardiology and internal medicine at [facility].  Unfortunately she developed acute symptomatic bradycardia with worsening shortness of breath requiring intubation  She was treated for acute heart failure with aggressive diuretics and was able to be extubated.  She  was transferred to [facility] for further care.    Hospital Course:   She underwent cardiac catheterization which showed no significant CAD.  It showed elevated PA pressures.  She remained hypotensive despite Neo-synephrine drip.  She was again treated with IV lasix with some improvement of symptoms.  It was felt surgical intervention would be required and TCTS consult was obtained.  She was evaluated by Dr. Cyndia Bent who felt the patient would require surgical intervention.  The risks and benefits of the procedure were explained to the patient and she was agreeable to proceed.  She was taken to the operating room on 04/29/2018.  She underwent Mitral Valve Repair and Aortic Valve Replacement.  She tolerated the procedure without difficulty and was taken to the SICU in stable condition.  The patient self extubated overnight.  During her stay in the SICU the patient was restarted on Ancef per ID recommendations.  She would require 6 weeks of therapy post operatively.  She was weaned off Milrinone and Levophed as hemodynamics allowed.  She developed Atrial Flutter.  Rapid atrial pacing was attempted but  was unsuccessful.  She was started on Amiodarone therapy to try and achieve sinus rhythm.  She was started on low dose coumadin for her mitral valve repair.  Due to antibiotic use and Amiodarone use need to be cautious with dosing as she is quite sensitive to coumadin.  She was treated aggressively with IV diuretics for volume overload status.  She has been monitored by Cardiology who felt it would be best to try and control her rate with Lopressor and anticoagulation.  If she remains in Atrial flutter she may require cardioversion in the future.  However rate control has been achieved.  She is very deconditioned and will require aggressive PT/OT.  She was evaluated by CIR and felt to be a candidate for SNF placement.  She remains on IV Antibiotics for her endocarditis.  OR cultures came back negative for  bacteria.  She will require 6 weeks of therapy post surgery with end date of 06/09/2018.  We ask the SNF to please remove the PICC line on 06/10/2018 as IV antibiotics should be completed. Her INR is 2.13 today and she will be discharged on 2 mg of Coumadin daily.  Her goal INR will be 2.5-3.0.  We ask the SNF to please draw a PT and INR on Monday 05/15/2018 and call or fax results to Dr. Blenda Mounts office([phone],cardiologist). Her incisions are healing without evidence of infection.  She is ambulating with maximum assist.  As discussed with Dr. Cyndia Bent, she will be discharged on Lasix 40 mg daily with a potassium supplement. Chest tube sutures will be removed today. She is medically stable for discharge to SNF today.            We ask the SNF to please do the following: 1. Please obtain vital signs at least one time daily 2.Please weigh the patient daily. If he or she continues to gain weight or develops lower extremity edema, contact the office at [phone]. 3. Ambulate patient at least three times daily and please use sternal precautions.  Consults: cardiology and ID  Significant Diagnostic Studies: angiography:   - Left ventricle: Wall thickness was increased in a pattern of mild   LVH. Left ventricular diastolic function parameters were normal. - Aortic valve: Nodular calcification of the leaflet tips with   severe AS. There was severe stenosis. There was mild   regurgitation. Valve area (VTI): 0.73 cm^2. Valve area (Vmax):   0.69 cm^2. Valve area (Vmean): 0.74 cm^2. - Mitral valve: There was moderate regurgitation. - Left atrium: The atrium was moderately dilated. - Right atrium: The atrium was mildly dilated. - Tricuspid valve: There was moderate regurgitation. - Pulmonary arteries: PA peak pressure: 56 mm Hg (S).  Treatments: surgery:   1. Median Sternotomy 2. Extracorporeal circulation 3.   Aortic valve replacement using a 19 mm  Edwards INSPIRIS RESILIA pericardial  valve. 4.   Mitral valve annuloplasty using a 28 mm Sorin Memo 4D ring.  Discharge Exam: Blood pressure 107/62, pulse 89, temperature 98.3 F (36.8 C), temperature source Oral, resp. rate (!) 33, height 5\' 4"  (1.626 m), weight 110.2 kg, SpO2 93 %.  Disposition: Discharge disposition: 03-Skilled Nursing Facility     SNF  Discharge Medications:  Discharge Instructions    Home infusion instructions [facility] May follow [place] Dosing Protocol; May administer Cathflo as needed to maintain patency of vascular access device.; Flushing of vascular access device: per [facility] Protocol: 0.9% NaCl pre/post medica...   Complete by:  As directed  Instructions:  May follow [place] Dosing Protocol   Instructions:  May administer Cathflo as needed to maintain patency of vascular access device.   Instructions:  Flushing of vascular access device: per [facility] Protocol: 0.9% NaCl pre/post medication administration and prn patency; Heparin 100 u/ml, 109ml for implanted ports and Heparin 10u/ml, 87ml for all other central venous catheters.   Instructions:  May follow [facility] Anaphylaxis Protocol for First Dose Administration in the home: 0.9% NaCl at 25-50 ml/hr to maintain IV access for protocol meds. Epinephrine 0.3 ml IV/IM PRN and Benadryl 25-50 IV/IM PRN s/s of anaphylaxis.   Instructions:  [facility] Infusion Coordinator (RN) to assist per patient IV care needs in the home PRN.     Allergies as of 05/11/2018      Reactions   Penicillins Other (See Comments)   Fever -when she was 68 yrs old Has patient had a PCN reaction causing immediate rash, facial/tongue/throat swelling, SOB or lightheadedness with hypotension: NO Has patient had a PCN reaction causing severe rash involving mucus membranes or skin necrosis: Unknown Has patient had a PCN reaction that required hospitalization: Unknown Has patient had a PCN reaction occurring within the last 10 years: No Childhood allergy If all of  the above answers are "NO", then may proceed with Cephalosporin use.      Medication List    STOP taking these medications   HYDROcodone-acetaminophen 5-325 MG tablet Commonly known as:  NORCO   LEG CRAMPS Tabs   olmesartan-hydrochlorothiazide 20-12.5 MG tablet Commonly known as:  BENICAR HCT   predniSONE 10 MG tablet Commonly known as:  DELTASONE     TAKE these medications   acidophilus Caps capsule Take 1 capsule by mouth 2 (two) times daily.   amiodarone 200 MG tablet Commonly known as:  PACERONE Take 1 tablet (200 mg total) by mouth daily.   aspirin 81 MG EC tablet Take 1 tablet (81 mg total) by mouth daily. Start taking on:  May 12, 2018   ceFAZolin 2-4 GM/100ML-% IVPB Commonly known as:  ANCEF Inject 100 mLs (2 g total) into the vein every 8 (eight) hours.   DIALYVITE VITAMIN D 5000 125 MCG (5000 UT) capsule Generic drug:  Cholecalciferol Take 5,000 Units by mouth daily.   feeding supplement (ENSURE ENLIVE) Liqd Take 237 mLs by mouth 2 (two) times daily between meals.   furosemide 40 MG tablet Commonly known as:  LASIX Take 1 tablet (40 mg total) by mouth daily. Start taking on:  May 12, 2018   HAIR SKIN AND NAILS FORMULA Tabs Take 2 tablets by mouth daily.   metoprolol tartrate 25 MG tablet Commonly known as:  LOPRESSOR Take 0.5 tablets (12.5 mg total) by mouth 2 (two) times daily.   oxyCODONE 5 MG immediate release tablet Commonly known as:  Oxy IR/ROXICODONE Take 5 mg every 4-6 hours PRN severe pain What changed:    how much to take  how to take this  when to take this  reasons to take this  additional instructions   potassium chloride SA 20 MEQ tablet Commonly known as:  K-DUR,KLOR-CON Take 1 tablet (20 mEq total) by mouth daily. Start taking on:  May 12, 2018   rifampin 300 MG capsule Commonly known as:  RIFADIN Take 1 capsule (300 mg total) by mouth every 12 (twelve) hours.   warfarin 2 MG tablet Commonly known as:   COUMADIN Take 1 tablet (2 mg total) by mouth daily at 6 PM. Or as directed  Home Infusion Instuctions  (From admission, onward)         Start     Ordered   05/11/18 0000  Home infusion instructions [facility] May follow [place] Dosing Protocol; May administer Cathflo as needed to maintain patency of vascular access device.; Flushing of vascular access device: per [facility] Protocol: 0.9% NaCl pre/post medica...    Question Answer Comment  Instructions May follow [place] Dosing Protocol   Instructions May administer Cathflo as needed to maintain patency of vascular access device.   Instructions Flushing of vascular access device: per [facility] Protocol: 0.9% NaCl pre/post medication administration and prn patency; Heparin 100 u/ml, 65ml for implanted ports and Heparin 10u/ml, 10ml for all other central venous catheters.   Instructions May follow [facility] Anaphylaxis Protocol for First Dose Administration in the home: 0.9% NaCl at 25-50 ml/hr to maintain IV access for protocol meds. Epinephrine 0.3 ml IV/IM PRN and Benadryl 25-50 IV/IM PRN s/s of anaphylaxis.   Instructions [facility] Infusion Coordinator (RN) to assist per patient IV care needs in the home PRN.      05/11/18 1226        The patient has been discharged on:   1.Beta Blocker:  Yes [   x]                              No   [   ]                              If No, reason:  2.Ace Inhibitor/ARB: Yes [   ]                                     No  [    ]                                     If No, reason:Labile BP  3.Statin:   Yes [   ]                  No  [ x  ]                  If No, reason:No CAD  4.Shela CommonsLauro Regulus   ]                  No   [   ]                  If No, reason:  Follow-up Information    Gaynelle Arabian, MD. Call on 05/02/2018.   Specialty:  Orthopedic Surgery Why:  for a follow up appointment after discharge from [facility] information: [address] [place] [address] [place] [address] [phone]        Erlene Quan, PA-C Follow up on 05/25/2018.   Specialties:  Cardiology, Radiology Why:  Appointment is at 10:00 Contact information: [place] [address] [phone]        Gaye Pollack, MD Follow up on 06/07/2018.   Specialty:  Cardiothoracic Surgery Why:  Appointment is at 12:00, please get CXR at 11:30 at [facility] located  on first floor of our office building Contact information: [place] [address] [phone]        SNF Follow up.   Why:  Please obtain a PT and INR on Monday 05/15/2018 (on Coumadin, INR goal 2-3);Please fax results to Dr. Blenda Mounts office (cardiology, [phone]). Remove PICC line on 06/10/2018 as IV antibiotics will be finished          Signed: Original note dictated by Ellwood Handler PA-C Destiney Sanabia Liston Alba PA-C 05/11/2018, 12:55 PM

## 2018-05-08 NOTE — Progress Notes (Signed)
Progress Note  Patient Name: Elizabeth [name] Date of Encounter: 05/08/2018  Primary Cardiologist: Skeet Latch, MD   Subjective   Some improvement this AM but remains weak; no CP or dyspnea  Inpatient Medications    Scheduled Meds: . acidophilus  1 capsule Oral BID  . amiodarone  200 mg Oral Daily  . Chlorhexidine Gluconate Cloth  6 each Topical Daily  . DULoxetine  20 mg Oral Daily  . feeding supplement (ENSURE ENLIVE)  237 mL Oral BID BM  . furosemide  80 mg Intravenous Daily  . guaiFENesin  600 mg Oral BID  . metoprolol tartrate  12.5 mg Oral BID  . pantoprazole  40 mg Oral Q1200  . potassium chloride  40 mEq Oral BID  . rifampin  300 mg Oral Q12H  . sodium chloride flush  10-40 mL Intracatheter Q12H  . warfarin  2 mg Oral q1800  . Warfarin - Physician Dosing Inpatient   Does not apply q1800   Continuous Infusions: . sodium chloride Stopped (04/30/18 1844)  .  ceFAZolin (ANCEF) IV 2 g (05/08/18 0200)  . fluconazole (DIFLUCAN) IV Stopped (05/07/18 1611)   PRN Meds: ALPRAZolam, docusate sodium, metoprolol tartrate, ondansetron (ZOFRAN) IV, oxyCODONE, sodium chloride flush, traMADol   Vital Signs    Vitals:   05/08/18 0200 05/08/18 0400 05/08/18 0500 05/08/18 0600  BP: (!) 99/51 (!) 98/48 (!) 98/48 (!) 89/60  Pulse: 60 60 62 65  Resp: (!) 21 (!) 21 (!) 23 (!) 24  Temp:      TempSrc:      SpO2: 92% 95% 95% 94%  Weight:      Height:        Intake/Output Summary (Last 24 hours) at 05/08/2018 0829 Last data filed at 05/07/2018 2000 Gross per 24 hour  Intake 1714.93 ml  Output 1001 ml  Net 713.93 ml   Filed Weights   05/04/18 0500 05/05/18 0433 05/06/18 0500  Weight: 117.7 kg 121.8 kg 113.8 kg    Telemetry    Atrial flutter with improved rate- Personally Reviewed  Physical Exam   GEN: WD, obese, NAD Neck: supple Cardiac:  irrregular Respiratory: diminished BS bases; s/p sternotomy GI: Soft, NT/ND MS: 1+ edema Neuro:  Grossly intact   Labs      Chemistry Recent Labs  Lab 05/02/18 0647  05/05/18 0423 05/06/18 0609 05/07/18 0406 05/08/18 0308  NA 135   < > 136 136 134* 136  K 3.8   < > 3.8 3.2* 3.9 4.3  CL 98   < > 93* 95* 91* 95*  CO2 26   < > 32 31 33* 37*  GLUCOSE 109*   < > 92 94 142* 105*  BUN 20   < > 17 16 18  24*  CREATININE 0.87   < > 0.82 0.83 0.98 0.90  CALCIUM 8.1*   < > 8.1* 7.8* 8.2* 8.1*  PROT 5.8*  --  5.6*  --   --   --   ALBUMIN 2.8*  --  2.3*  --   --   --   AST 153*  --  24  --   --   --   ALT 55*  --  8  --   --   --   ALKPHOS 94  --  58  --   --   --   BILITOT 2.6*  --  2.1*  --   --   --   GFRNONAA >60   < > >  60 >60 59* >60  GFRAA >60   < > >60 >60 >60 >60  ANIONGAP 11   < > 11 10 10  4*   < > = values in this interval not displayed.     Hematology Recent Labs  Lab 05/03/18 0432 05/06/18 0609 05/07/18 0406  WBC 12.8* 10.1 10.9*  RBC 3.60* 3.52* 3.32*  HGB 10.5* 10.0* 9.5*  HCT 32.9* 33.3* 31.7*  MCV 91.4 94.6 95.5  MCH 29.2 28.4 28.6  MCHC 31.9 30.0 30.0  RDW 17.5* 18.4* 18.1*  PLT 113* 146* 178    Patient Profile     68 y.o. female with staph lugdenensis bacteremia and mitral/aortic valve endocarditis, likely 2/2 septic hip, now s/p AVR and MV repair  Assessment & Plan    1 supraventricular tachycardia/atrial flutter-patient remains in atrial flutter this morning.  However heart rate is much improved.  Continue present dose of amiodarone.  Continue metoprolol 12.5 mg twice daily.  Blood pressure is borderline so I will not advance this morning.  Continue Coumadin.  Would plan rate control and anticoagulation at this point.  If atrial flutter persists in 6 to 8 weeks could proceed with cardioversion at that time.    2 status post aortic valve replacement and mitral valve repair secondary to endocarditis-continue antibiotics.  3 postoperative volume excess-plan to continue present dose of IV Lasix.  Follow renal function.  For questions or updates, please contact [facility] [place] Please consult [email or web address] for contact info under        Signed, Kirk Ruths, MD  05/08/2018, 8:29 AM

## 2018-05-08 NOTE — Consult Note (Signed)
[facility] Nurse wound consult note Reason for Consult: moisture associated skin damage (MASD), specifically intertriginous dermatitis (ITD) in the subpannicular and inguinal skin folds Wound type: moisture Pressure Injury POA: N/A Measurement: 0.2cm x 10cm x 0.1cm at left subpannicular skin fold.  Pooling moisture (sweat) in left inguinal).  Right inguinal: 0.2cm x 6cm x 0.1cm Wound FPO:[id number] areas of partial thickness tissue loss, but nystatin powder is caked as well. Drainage (amount, consistency, odor) serous, scant Periwound: dry and intact Dressing procedure/placement/frequency: It is noted that systemic diflucan (antifungal) was initiated on 05/07/2018 for 3 days and that areas are not as "moist" as the nurse caring for her recalls it being at the end of last week. We will clean up the orders since topical powder orders are still in place despite antimicrobial textile requiring direct contact with skin. Additionally, the systemic antifungal (Diflucan) will expedite resolution of the ITD.  Recommendation:  Continue InterDry Ag+ for skin fold moisture management until the problem is resolved. Discontinue use of antifungal powders or creams.  May need to consider repeat 3-day order of systemic antifungal (Diflucan) when this order expires on 05/09/2018.    [facility] nursing team will not follow, but will remain available to this patient, the nursing and medical teams.  Please re-consult if needed. Thanks, Maudie Flakes, MSN, RN, [place], Arther Abbott  Pager# [phone]

## 2018-05-09 DIAGNOSIS — I10 Essential (primary) hypertension: Secondary | ICD-10-CM

## 2018-05-09 DIAGNOSIS — R0682 Tachypnea, not elsewhere classified: Secondary | ICD-10-CM

## 2018-05-09 DIAGNOSIS — D72829 Elevated white blood cell count, unspecified: Secondary | ICD-10-CM

## 2018-05-09 DIAGNOSIS — D62 Acute posthemorrhagic anemia: Secondary | ICD-10-CM

## 2018-05-09 DIAGNOSIS — Z85118 Personal history of other malignant neoplasm of bronchus and lung: Secondary | ICD-10-CM

## 2018-05-09 DIAGNOSIS — A412 Sepsis due to unspecified staphylococcus: Secondary | ICD-10-CM

## 2018-05-09 LAB — PROTIME-INR
INR: 2.29
Prothrombin Time: 24.9 seconds — ABNORMAL HIGH (ref 11.4–15.2)

## 2018-05-09 NOTE — Progress Notes (Addendum)
Physical Therapy Treatment Patient Details Name: TRINKA KESHISHYAN MRN: [id number] DOB: 1951/01/25 Today's Date: 05/09/2018    History of Present Illness 68 y.o. 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 During this hospitalization she had wash out of hip on 12/16 and is now s/p Median Sternotomy, Aortic valve replacement pericardial valve and MR repair with Mitral valve annuloplasty on 12/28. PMH - rt TKR (8/19), OA, HTN, lung CA    PT Comments    Pt making slow, steady progress. Pt fatigues quickly with mobility and is anxious about falling. HR to 140's with very short ambulation. Continue to recommend SNF to give pt extended time to make progress to return home.    Follow Up Recommendations  SNF     Equipment Recommendations  Other (comment)(tbd)    Recommendations for Other Services       Precautions / Restrictions Precautions Precautions: Fall Precaution Booklet Issued: No Precaution Comments: left ant. hip incision; pt fatigues very easily    Mobility  Bed Mobility Overal bed mobility: Needs Assistance Bed Mobility: Sit to Sidelying        Sit to sidelying: +2 for physical assistance;Mod assist General bed mobility comments: Assist to lower trunk and bring legs up into bed  Transfers Overall transfer level: Needs assistance Equipment used: Rolling walker (2 wheeled);Ambulation equipment used Transfers: Sit to/from [facility] Sit to Stand: +2 physical assistance;Min assist Stand pivot transfers: +2 physical assistance;Min assist       General transfer comment: Assist to bring hips up and for balance. Pt stood from bedside commode with Stedy. Used Stedy to go bsc to [employer]. Stood from Psychologist, occupational with walker x3. Stand pivot with walker from recliner to bed.  Verbal cues to stand more erect.   Ambulation/Gait Ambulation/Gait assistance: +2 physical assistance;Min assist Gait Distance (Feet): 3 Feet(x 2) Assistive device: Rolling walker (2 wheeled) Gait Pattern/deviations: Step-to pattern;Decreased step length - right;Decreased step length - left;Shuffle;Trunk flexed Gait velocity: decr Gait velocity interpretation: <1.31 ft/sec, indicative of household ambulator General Gait Details: Assist for balance and support. Pt fatigued quickly and HR to 140's. Sat in recliner to rest between 2 walks.   Stairs             Wheelchair Mobility    Modified Rankin (Stroke Patients Only)       Balance Overall balance assessment: Needs assistance Sitting-balance support: Single extremity supported;Feet supported Sitting balance-Leahy Scale: Poor Sitting balance - Comments: UE support   Standing balance support: Bilateral upper extremity supported Standing balance-Leahy Scale: Poor Standing balance comment: Walker or stedy and min assist for static standing                            Cognition Arousal/Alertness: Awake/alert Behavior During Therapy: Anxious Overall Cognitive Status: Within Functional Limits for tasks assessed                                        Exercises      General Comments General comments (skin integrity, edema, etc.): HR to 144       Pertinent Vitals/Pain Pain Assessment: Faces Faces Pain Scale: Hurts little more Pain Location: generalized  Pain Descriptors / Indicators: Grimacing Pain Intervention(s):  Monitored during session    Home Living                      Prior Function            PT Goals (current goals can now be found in the care plan section) Progress towards PT goals: Progressing toward goals    Frequency    Min 2X/week      PT Plan Current plan remains appropriate    Co-evaluation PT/OT/SLP Co-Evaluation/Treatment: Yes Reason for  Co-Treatment: Complexity of the patient's impairments (multi-system involvement);For patient/therapist safety PT goals addressed during session: Mobility/safety with mobility OT goals addressed during session: ADL's and self-care      AM-PAC PT "6 Clicks" Mobility   Outcome Measure  Help needed turning from your back to your side while in a flat bed without using bedrails?: A Lot Help needed moving from lying on your back to sitting on the side of a flat bed without using bedrails?: A Lot Help needed moving to and from a bed to a chair (including a wheelchair)?: A Lot Help needed standing up from a chair using your arms (e.g., wheelchair or bedside chair)?: A Lot Help needed to walk in hospital room?: A Lot Help needed climbing 3-5 steps with a railing? : Total 6 Click Score: 11    End of Session Equipment Utilized During Treatment: Gait belt Activity Tolerance: Patient limited by fatigue Patient left: with call bell/phone within reach;in bed(in chair position) Nurse Communication: Mobility status;Need for lift equipment PT Visit Diagnosis: Unsteadiness on feet (R26.81);Muscle weakness (generalized) (M62.81)     Time: 8921-1941 PT Time Calculation (min) (ACUTE ONLY): 29 min  Charges:  $Therapeutic Activity: 8-22 mins                     [place] Pager [phone] Office [place] 05/09/2018, 3:31 PM

## 2018-05-09 NOTE — Progress Notes (Signed)
Occupational therapy Progress Note  Pt seen second time with PT and rehab tech.  Pt transferred off of BSC with use of stedy and +2 mod A.  She then transferred to recliner and  Then ambulated 3-4' x 2 with RW and min A +2 and third person for chair following - see PT notes.  Pt transferred back to bed due to fatigue.  HR to 144.   Pt is making steady progress.  Continues to be anxious with activity.  Recommend SNF.    05/09/18 1514  OT Visit Information  Last OT Received On 05/09/18  Assistance Needed +2  PT/OT/SLP Co-Evaluation/Treatment Yes  Reason for Co-Treatment Complexity of the patient's impairments (multi-system involvement);For patient/therapist safety;To address functional/ADL transfers  OT goals addressed during session ADL's and self-care  History of Present Illness 68 y.o. 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 During this hospitalization she had wash out of hip on 12/16 and is now s/p Median Sternotomy, Aortic valve replacement pericardial valve and MR repair with Mitral valve annuloplasty on 12/28. PMH - rt TKR (8/19), OA, HTN, lung CA  Precautions  Precautions Fall;Knee;Sternal  Precaution Booklet Issued No  Precaution Comments left ant. hip incision; pt fatigues very easily  Pain Assessment  Pain Assessment Faces  Faces Pain Scale 4  Pain Location generalized   Pain Descriptors / Indicators Grimacing  Pain Intervention(s) Monitored during session  Cognition  Arousal/Alertness Awake/alert  Behavior During Therapy Anxious  Overall Cognitive Status Within Functional Limits for tasks assessed  Upper Extremity Assessment  Upper Extremity Assessment Generalized weakness  Lower Extremity Assessment  Lower Extremity Assessment Defer to PT evaluation  ADL  Overall ADL's  Needs  assistance/impaired  Toilet Transfer Moderate assistance;Stand-pivot;BSC;+2 for physical assistance (stedy )  Toilet Transfer Details (indicate cue type and reason) Pt assisted off of BSC with +2 assist due to lower surface   Toileting- Clothing Manipulation and Hygiene Total assistance;+2 for physical assistance;Sit to/from stand  Functional mobility during ADLs Moderate assistance;+2 for physical assistance (stedy )  Balance  Overall balance assessment Needs assistance  Sitting-balance support Single extremity supported  Sitting balance-Leahy Scale Poor  Sitting balance - Comments initially requires min A   Standing balance support Bilateral upper extremity supported  Standing balance-Leahy Scale Poor  Standing balance comment requires UE support and mod A   Transfers  Overall transfer level Needs assistance  Equipment used Ambulation equipment used;Rolling walker (2 wheeled)  Transfer via Actuary  Transfers Sit to/from [facility]  Sit to Stand +2 physical assistance;Mod assist;+2 safety/equipment  Stand pivot transfers Mod assist;+2 physical assistance;+2 safety/equipment  General transfer comment assist to power up into standing and assist for balance.  third person present with chair close by once pt progressed to use of RW   General Comments  General comments (skin integrity, edema, etc.) HR to 144   OT - End of Session  Equipment Utilized During Treatment Gait belt (stedy )  Activity Tolerance Patient limited by fatigue  Patient left Other (comment) [facility] )  Nurse Communication Mobility status;Need for lift equipment  OT Assessment/Plan  OT Plan Discharge plan remains appropriate  OT Visit Diagnosis Pain;Unsteadiness on feet (R26.81);Muscle weakness (generalized) (M62.81)  OT Frequency (ACUTE ONLY) Min 2X/week  Follow Up Recommendations SNF  OT Equipment None recommended by OT  AM-PAC OT "6 Clicks"  Daily Activity Outcome Measure (Version 2)   Help from another person eating meals? 4  Help from another person taking care of personal grooming? 3  Help from another person toileting, which includes using toliet, bedpan, or urinal? 2  Help from another person bathing (including washing, rinsing, drying)? 2  Help from another person to put on and taking off regular upper body clothing? 2  Help from another person to put on and taking off regular lower body clothing? 1  6 Click Score 14  OT Goal Progression  Progress towards OT goals Progressing toward goals  OT Time Calculation  OT Start Time (ACUTE ONLY) 1155  OT Stop Time (ACUTE ONLY) 1218  OT Time Calculation (min) 23 min  OT General Charges  $OT Visit 1 Visit  OT Treatments  $Therapeutic Activity 8-22 mins  Lucille Passy, OTR/L Acute Rehabilitation Services Pager [phone] Office [phone]

## 2018-05-09 NOTE — Progress Notes (Signed)
Pharmacy Antibiotic Note  Elizabeth [name] is a 68 y.o. female admitted on 04/09/2018 with Staph aortic valve endocarditis.  Pharmacy has been consulted for Ancef dosing. Today patient is afebrile, WBC remains wnl, and patient's Scr remains stable.  Plan: Continue cefazolin 2g IV q8h Cefazolin to be continued through 06/09/2018 Pharmacy will continue following  Height: 5\' 4"  (162.6 cm) Weight: 247 lb 2.2 oz (112.1 kg) IBW/kg (Calculated) : 54.7  Temp (24hrs), Avg:98.6 F (37 C), Min:98.5 F (36.9 C), Max:98.7 F (37.1 C)  Recent Labs  Lab 05/03/18 0432 05/04/18 0331 05/05/18 0423 05/06/18 0609 05/07/18 0406 05/08/18 0308  WBC 12.8*  --   --  10.1 10.9*  --   CREATININE 0.85 0.68 0.82 0.83 0.98 0.90    Estimated Creatinine Clearance: 73.4 mL/min (by C-G formula based on SCr of 0.9 mg/dL).    Allergies  Allergen Reactions  . Penicillins Other (See Comments)    Fever -when she was 68 yrs old Has patient had a PCN reaction causing immediate rash, facial/tongue/throat swelling, SOB or lightheadedness with hypotension: NO Has patient had a PCN reaction causing severe rash involving mucus membranes or skin necrosis: Unknown Has patient had a PCN reaction that required hospitalization: Unknown Has patient had a PCN reaction occurring within the last 10 years: No Childhood allergy If all of the above answers are "NO", then may proceed with Cephalosporin use.     Antimicrobials this admission: Cefazolin 12/9 >> (06/09/2018) Rifampin 12/30 >>(06/09/2018) Fluconazole 1/5 >> (05/10/2018)  Microbiology results: 12/28 tissue: no growth (final) 12/16 synovial fluid cx ngtd 12/10 blood cx ngtd 12/9 synovial fluid staph ludg 12/8 blood cx staph ludg  Thank you for allowing pharmacy to be a part of this patient's care.  Leron Croak, PharmD PGY1 Pharmacy Resident Phone: [phone]  Please check AMION for all [place] phone numbers 05/09/2018 3:32 PM

## 2018-05-09 NOTE — Consult Note (Signed)
Physical Medicine and Rehabilitation Consult Reason for Consult:  Decreased functional mobility Referring Physician: Dr. Cyndia Bent  HPI: Elizabeth [name] is a 68 y.o.right handed female with known history of hypertension, right TKA 12/12/2017, hyperlipidemia, history of lung cancer 2010, remote tobacco abuse. Per chart review and patient, lives with daughter and grandson, however, they work full time and are planning on moving out. One level home two steps to entry. Admitted 04/09/2018 by report patient with osteoarthritis of left hip had received a steroid injection. She later developed fevers, hypotension. WBC 15,400, creatinine 1.40, blood cultures Staphylococcus Lugdunensis, sedimentation rate 62, influenza panel negative, urinalysis negative, lactic acid 1.30. Chest x-ray no evidence of acute pulmonary disease. Echocardiogram with ejection fraction of 65% no wall motion abnormalities, aortic valve severely thickened calcified leaflets 1.5 x 2 cm calcified aortic mass possible old calcified vegetation. There were concerns of septic arthritis of left hip underwent irrigation and debridement 04/18/2018 per Dr. Hector Shade. Follow-up cardiothoracic surgery and underwent aortic valve replacement/mitral valve annuloplasty 04/29/2018 per Dr. Cyndia Bent. Hospital course complicated by supraventricular tachycardia atrial flutter follow-up cardiology services placed on metoprolol as well as Coumadin. Consideration be made for possible need for cardioversion.Infectious disease follow-up patient remains on Cefzil 12 g every 8 hours 6 week course. Therapy evaluation completed M.D. has requested physical medicine rehabilitation consult.  Review of Systems  Constitutional: Positive for malaise/fatigue.  HENT: Negative for hearing loss.   Eyes: Negative for blurred vision and double vision.  Respiratory: Negative for cough and shortness of breath.   Cardiovascular: Positive for leg swelling. Negative for chest  pain.  Gastrointestinal: Positive for constipation. Negative for nausea and vomiting.  Genitourinary: Negative for dysuria, flank pain and hematuria.  Musculoskeletal: Positive for joint pain and myalgias.  Skin: Negative for rash.  Neurological: Positive for weakness.  All other systems reviewed and are negative.  Past Medical History:  Diagnosis Date  . Aortic stenosis, mild 01/08/2015  . Dyspnea    with rest   . Dysrhythmia   . Hx of cancer of lung 6/10   Removed June 2010  . Hyperlipidemia   . Hypertension   . Low back pain   . Morbid obesity ([place]) 01/08/2015  . Right foot injury    report she sustained a fracture to her right , present with ortho boot on affected extremity   . Vitamin D deficiency    Past Surgical History:  Procedure Laterality Date  . ANKLE FUSION Left   . AORTIC VALVE REPLACEMENT N/A 04/29/2018   Procedure: AORTIC VALVE REPLACEMENT (AVR);  Surgeon: Gaye Pollack, MD;  Location: [facility];  Service: Open Heart Surgery;  Laterality: N/A;  . CHOLECYSTECTOMY    . INCISION AND DRAINAGE HIP Left 04/17/2018   Procedure: IRRIGATION AND DEBRIDEMENT LEFT HIP (ANTERIOR);  Surgeon: Gaynelle Arabian, MD;  Location: [facility];  Service: Orthopedics;  Laterality: Left;  . LUNG REMOVAL, PARTIAL    . MITRAL VALVE REPLACEMENT N/A 04/29/2018   Procedure: MITRAL VALVE (MV) REPLACEMENT;  Surgeon: Gaye Pollack, MD;  Location: [facility];  Service: Open Heart Surgery;  Laterality: N/A;  . RIGHT/LEFT HEART CATH AND CORONARY ANGIOGRAPHY N/A 04/28/2018   Procedure: RIGHT/LEFT HEART CATH AND CORONARY ANGIOGRAPHY;  Surgeon: Jolaine Artist, MD;  Location: [facility];  Service: Cardiovascular;  Laterality: N/A;  . TEE WITHOUT CARDIOVERSION     04/17/2018  . TEE WITHOUT CARDIOVERSION N/A 04/17/2018   Procedure: TRANSESOPHAGEAL ECHOCARDIOGRAM (TEE);  Surgeon: Loralie Champagne  S, MD;  Location: [facility];  Service: Cardiovascular;  Laterality: N/A;  . TOTAL KNEE ARTHROPLASTY Right  12/12/2017   Procedure: RIGHT TOTAL KNEE ARTHROPLASTY;  Surgeon: Gaynelle Arabian, MD;  Location: [facility];  Service: Orthopedics;  Laterality: Right;  . TUBAL LIGATION     Family History  Problem Relation Age of Onset  . Hypertension Mother   . Stroke Mother   . Cancer Mother        lung  . Heart disease Father        cabg  . Cancer Brother        skin   Social History:  reports that she quit smoking about 13 years ago. Her smoking use included cigarettes. She has never used smokeless tobacco. She reports that she does not drink alcohol or use drugs. Allergies:  Allergies  Allergen Reactions  . Penicillins Other (See Comments)    Fever -when she was 68 yrs old Has patient had a PCN reaction causing immediate rash, facial/tongue/throat swelling, SOB or lightheadedness with hypotension: NO Has patient had a PCN reaction causing severe rash involving mucus membranes or skin necrosis: Unknown Has patient had a PCN reaction that required hospitalization: Unknown Has patient had a PCN reaction occurring within the last 10 years: No Childhood allergy If all of the above answers are "NO", then may proceed with Cephalosporin use.    Medications Prior to Admission  Medication Sig Dispense Refill  . Cholecalciferol (DIALYVITE VITAMIN D 5000) 125 MCG (5000 UT) capsule Take 5,000 Units by mouth daily.    . Homeopathic Products (LEG CRAMPS) TABS Take 3 tablets by mouth daily as needed (cramping).    Marland Kitchen HYDROcodone-acetaminophen (NORCO) 5-325 MG tablet Take 1 tablet by mouth every 4 (four) hours as needed (for pain). 20 tablet 0  . Multiple Vitamins-Minerals (HAIR SKIN AND NAILS FORMULA) TABS Take 2 tablets by mouth daily.    Marland Kitchen olmesartan-hydrochlorothiazide (BENICAR HCT) 20-12.5 MG tablet Take 1 tablet by mouth daily. 30 tablet 3  . oxyCODONE (OXY IR/ROXICODONE) 5 MG immediate release tablet Take 7.5 mg by mouth every 6 (six) hours as needed for moderate pain or severe pain.     . predniSONE  (DELTASONE) 10 MG tablet Take 10 mg by mouth as directed. Take 6 tablet for 2 Days Take 5 tablet for 2 Days Take 4 tablets for 2 Days Take 3 tablets for 2 Days Take 2 tablets for 2 Days Take 1 tablet for 2 Days      Home: Home Living Family/patient expects to be discharged to:: Private residence Living Arrangements: Children Available Help at Discharge: Family Type of Home: House Home Access: Stairs to enter Technical brewer of Steps: 2 small steps Entrance Stairs-Rails: None Home Layout: One level Bathroom Shower/Tub: Chiropodist: Handicapped height Home Equipment: Environmental consultant - 2 wheels, Bedside commode, Cane - single point, Careers adviser History: Prior Function Level of Independence: Needs assistance ADL's / Homemaking Assistance Needed: family assisted with LB ADLs and to get onto tub seat.  Used reacher for pants Comments: had completed OPPT, ambulated x 15' with cane Functional Status:  Mobility: Bed Mobility Overal bed mobility: Needs Assistance Bed Mobility: Supine to Sit Rolling: Mod assist, +2 for safety/equipment Supine to sit: +2 for physical assistance, Mod assist Sit to supine: +2 for physical assistance, Max assist Sit to sidelying: Mod assist, +2 for safety/equipment General bed mobility comments: Assist to bring legs off of bed, elevate trunk into sitting, and bring hips to  EOB. Assist to lower trunk and bring legs back up into bed. Transfers Overall transfer level: Needs assistance Equipment used: Rolling walker (2 wheeled), Ambulation equipment used Transfer via Lift Equipment: Stedy Transfers: Sit to/from Stand, Risk manager Sit to Stand: +2 physical assistance, Min assist Stand pivot transfers: +2 physical assistance, Min assist  Lateral/Scoot Transfers: Max assist, +2 physical assistance, +2 safety/equipment General transfer comment: Assist to bring hips up. Pt stood from bed x 2 and from bsc   Ambulation/Gait Ambulation/Gait assistance: +2 physical assistance, Min assist Gait Distance (Feet): 5 Feet Assistive device: Rolling walker (2 wheeled) Gait Pattern/deviations: Step-to pattern, Decreased step length - right, Decreased step length - left, Shuffle, Trunk flexed General Gait Details: Assist for balance and support. Gait velocity: decr Gait velocity interpretation: <1.31 ft/sec, indicative of household ambulator    ADL: ADL Overall ADL's : Needs assistance/impaired Eating/Feeding: Set up Eating/Feeding Details (indicate cue type and reason): has been self-feeding with L hand Grooming: Wash/dry face, Oral care, Brushing hair, Minimal assistance(at sink with stedy) Grooming Details (indicate cue type and reason): pt able to lead with her R hand Upper Body Bathing: Moderate assistance Lower Body Bathing: Maximal assistance, Total assistance Upper Body Dressing : Maximal assistance, Sitting Lower Body Dressing: Total assistance, Bed level(socks) Toilet Transfer: +2 for physical assistance, Total assistance, BSC(with stedy) Toilet Transfer Details (indicate cue type and reason): lateral scoot simulated through chair to bed transfer Winn and Hygiene: Total assistance, +2 for physical assistance, Sit to/from stand(with stedy) Functional mobility during ADLs: Maximal assistance, +2 for physical assistance, +2 for safety/equipment(squat pivot) General ADL Comments: pt immediately willing to work with OT  Cognition: Cognition Overall Cognitive Status: Within Functional Limits for tasks assessed Orientation Level: Oriented X4 Cognition Arousal/Alertness: Awake/alert Behavior During Therapy: WFL for tasks assessed/performed Overall Cognitive Status: Within Functional Limits for tasks assessed General Comments: Pt relieved to sit up and get off back for first time in days but fatigues easily and unwilling to attempt further activity  Blood pressure  (!) 113/53, pulse 84, temperature 98.7 F (37.1 C), temperature source Oral, resp. rate (!) 28, height 5\' 4"  (1.626 m), weight 112.1 kg, SpO2 93 %. Physical Exam  Vitals reviewed. Constitutional: She is oriented to person, place, and time. She appears well-developed.  Morbidly obese  HENT:  Head: Normocephalic and atraumatic.  Eyes: EOM are normal. Right eye exhibits no discharge. Left eye exhibits no discharge.  Neck: Normal range of motion. Neck supple. No thyromegaly present.  Cardiovascular: Normal rate and regular rhythm.  Respiratory: Effort normal and breath sounds normal. No respiratory distress.  GI: Soft. Bowel sounds are normal. She exhibits no distension.  Musculoskeletal:     Comments: LE edema  Neurological: She is alert and oriented to person, place, and time.  Motor: 4/5 grossly throughout  Skin: Skin is warm and dry.  Psychiatric: She has a normal mood and affect. Her behavior is normal.    Results for orders placed or performed during the hospital encounter of 04/09/18 (from the past 24 hour(s))  Protime-INR     Status: Abnormal   Collection Time: 05/09/18  5:42 AM  Result Value Ref Range   Prothrombin Time 24.9 (H) 11.4 - 15.2 seconds   INR 2.29    No results found.  Assessment/Plan: Diagnosis: Debility Labs independently reviewed.  Records reviewed and summated above.  1. Does the need for close, 24 hr/day medical supervision in concert with the patient's rehab needs make it unreasonable for this  patient to be served in a less intensive setting? Potentially  2. Co-Morbidities requiring supervision/potential complications: supraventricular tachycardia and atrial flutter (cont meds, monitor HR with increased activity), HTN (monitor and provide prns in accordance with increased physical exertion and pain), right TKA 12/12/2017, hyperlipidemia, history of lung cancer 2010, remote tobacco abuse, tachypnea (monitor RR and O2 Sats with increased physical exertion),  leukocytosis (repeat labs, cont to monitor for signs and symptoms of infection, further workup if indicated), ABLA (repeat labs, transfuse to ensure appropriate perfusion for increased activity tolerance) 3. Due to safety, skin/wound care, disease management, pain management and patient education, does the patient require 24 hr/day rehab nursing? Yes 4. Does the patient require coordinated care of a physician, rehab nurse, PT (1-2 hrs/day, 5 days/week) and OT (1-2 hrs/day, 5 days/week) to address physical and functional deficits in the context of the above medical diagnosis(es)? Yes Addressing deficits in the following areas: balance, endurance, locomotion, strength, transferring, bathing, dressing, toileting and psychosocial support 5. Can the patient actively participate in an intensive therapy program of at least 3 hrs of therapy per day at least 5 days per week? No 6. The potential for patient to make measurable gains while on inpatient rehab is good 7. Anticipated functional outcomes upon discharge from inpatient rehab are min assist  with PT, min assist and mod assist with OT, n/a with SLP. 8. Estimated rehab length of stay to reach the above functional goals is: 18-22 days. 9. Anticipated D/C setting: Other 10. Anticipated post D/C treatments: HH therapy and Home excercise program 11. Overall Rehab/Functional Prognosis: good  RECOMMENDATIONS: This patient's condition is appropriate for continued rehabilitative care in the following setting: Patient will likely benefit from less intense, more prolonged therapies, especially given lack of support at discharge.  Agree with therapies, recommend SNF. Patient has agreed to participate in recommended program. Potentially Note that insurance prior authorization may be required for reimbursement for recommended care.  Comment: Rehab Admissions Coordinator to follow up.   I have personally performed a face to face diagnostic evaluation, including,  but not limited to relevant history and physical exam findings, of this patient and developed relevant assessment and plan.  Additionally, I have reviewed and concur with the physician assistant's documentation above.   Delice Lesch, MD, ABPMR Lavon Paganini [place], PA-C 05/09/2018

## 2018-05-09 NOTE — Progress Notes (Signed)
10 Days Post-Op Procedure(s) (LRB): AORTIC VALVE REPLACEMENT (AVR) (N/A) MITRAL VALVE (MV) REPLACEMENT (N/A) Subjective: Feels ok. Bowels working. Ambulated in room 5 ft with PT yesterday.  Objective: Vital signs in last 24 hours: Temp:  [98.5 F (36.9 C)-99.1 F (37.3 C)] 98.7 F (37.1 C) (01/07 0800) Pulse Rate:  [51-131] 84 (01/07 0800) Cardiac Rhythm: Normal sinus rhythm;Atrial flutter (01/07 0800) Resp:  [21-31] 28 (01/07 0800) BP: (83-121)/(42-75) 113/53 (01/07 0800) SpO2:  [92 %-97 %] 93 % (01/07 0800) Weight:  [112.1 kg-112.7 kg] 112.1 kg (01/07 0500)  Hemodynamic parameters for last 24 hours:    Intake/Output from previous day: 01/06 0701 - 01/07 0700 In: 1372 [P.O.:880; I.V.:42.1; IV Piggyback:449.8] Out: 1300 [Urine:1300] Intake/Output this shift: No intake/output data recorded.  General appearance: alert and cooperative Heart: regular rate and rhythm, S1, S2 normal, no murmur, click, rub or gallop Lungs: clear to auscultation bilaterally Extremities: edema mild edema in legs and feet. Wound: incision ok  Lab Results: Recent Labs    05/07/18 0406  WBC 10.9*  HGB 9.5*  HCT 31.7*  PLT 178   BMET:  Recent Labs    05/07/18 0406 05/08/18 0308  NA 134* 136  K 3.9 4.3  CL 91* 95*  CO2 33* 37*  GLUCOSE 142* 105*  BUN 18 24*  CREATININE 0.98 0.90  CALCIUM 8.2* 8.1*    PT/INR:  Recent Labs    05/09/18 0542  LABPROT 24.9*  INR 2.29   ABG    Component Value Date/Time   PHART 7.438 04/30/2018 0210   HCO3 26.0 04/30/2018 0210   TCO2 27 04/30/2018 1719   O2SAT 95.4 05/03/2018 0441   CBG (last 3)  No results for input(s): GLUCAP in the last 72 hours.  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  INR therapeutic and slowly rising. Will see where it is tomorrow and may need to decrease coumadin dose a little.  Atrial flutter with controlled rate: continue amio and lopressor.  Volume excess: continue diuresis.  IS, OOB  Physical therapy.  Will transfer to 4E.   Social work or SNF planning in progress.  LOS: 30 days    Elizabeth [name] 05/09/2018

## 2018-05-09 NOTE — Progress Notes (Signed)
Occupational Therapy Treatment Patient Details Name: Elizabeth [name] MRN: [id number] DOB: 10/01/1950 Today's Date: 05/09/2018    History of present illness 68 y.o. 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 During this hospitalization she had wash out of hip on 12/16 and is now s/p Median Sternotomy, Aortic valve replacement pericardial valve and MR repair with Mitral valve annuloplasty on 12/28. PMH - rt TKR (8/19), OA, HTN, lung CA   OT comments  Pt able to move to EOB with max A +1 person, and able to transfer to [facility] with +1 assist with use of steady.  Pt very anxious and requires encouragement to build confidence.  She requires increased time for all aspects of activity.  HR to 128.   Follow Up Recommendations  SNF    Equipment Recommendations  None recommended by OT    Recommendations for Other Services      Precautions / Restrictions Precautions Precautions: Fall;Knee;Sternal Precaution Booklet Issued: No Precaution Comments: left ant. hip incision; pt fatigues very easily       Mobility Bed Mobility Overal bed mobility: Needs Assistance Bed Mobility: Supine to Sit     Supine to sit: Max assist     General bed mobility comments: Pt assisted with moving LEs off bed, requires increased assist for the Lt.  Assist to lift trunk, with pt helping very little, cuing for pt to lean forward   Transfers Overall transfer level: Needs assistance Equipment used: Ambulation equipment used Transfers: Sit to/from [employer] Transfers Sit to Stand: Mod assist Stand pivot transfers: Mod assist       General transfer comment: Pt required 2 attempts to achieve standing.  Assist to power up into standing and assist     Balance Overall balance assessment: Needs  assistance Sitting-balance support: Single extremity supported Sitting balance-Leahy Scale: Poor Sitting balance - Comments: initially requires min A    Standing balance support: Bilateral upper extremity supported Standing balance-Leahy Scale: Poor Standing balance comment: requires UE support and mod A                            ADL either performed or assessed with clinical judgement   ADL Overall ADL's : Needs assistance/impaired                         Toilet Transfer: Moderate assistance;Stand-pivot;BSC(stedy ) Toilet Transfer Details (indicate cue type and reason): Pt assisted to [facility] using stedy.  Requires assist to power up into standing and assist to control descent          Functional mobility during ADLs: Moderate assistance(stedy )       Vision       Perception     Praxis      Cognition Arousal/Alertness: Awake/alert Behavior During Therapy: Anxious Overall Cognitive Status: Within Functional Limits for tasks assessed                                          Exercises     Shoulder Instructions       General Comments HR 10 128    Pertinent Vitals/ Pain       Pain Assessment: Faces Faces  Pain Scale: Hurts little more Pain Location: generalized  Pain Descriptors / Indicators: Grimacing Pain Intervention(s): Monitored during session;Repositioned  Home Living                                          Prior Functioning/Environment              Frequency  Min 2X/week        Progress Toward Goals  OT Goals(current goals can now be found in the care plan section)  Progress towards OT goals: Progressing toward goals     Plan Discharge plan remains appropriate    Co-evaluation                 AM-PAC OT "6 Clicks" Daily Activity     Outcome Measure   Help from another person eating meals?: None Help from another person taking care of personal grooming?: A Little Help  from another person toileting, which includes using toliet, bedpan, or urinal?: A Lot Help from another person bathing (including washing, rinsing, drying)?: A Lot Help from another person to put on and taking off regular upper body clothing?: A Lot Help from another person to put on and taking off regular lower body clothing?: Total 6 Click Score: 14    End of Session Equipment Utilized During Treatment: Gait belt(stedy )  OT Visit Diagnosis: Pain;Unsteadiness on feet (R26.81);Muscle weakness (generalized) (M62.81)   Activity Tolerance Patient limited by fatigue   Patient Left Other (comment)(BSC )   Nurse Communication Mobility status;Need for lift equipment        Time: 1779-3903 OT Time Calculation (min): 26 min  Charges: OT General Charges $OT Visit: 1 Visit OT Treatments $Self Care/Home Management : 23-37 mins  Lucille Passy, OTR/L [place] Pager [phone] Office [phone]    Lucille Passy M 05/09/2018, 3:13 PM

## 2018-05-09 NOTE — Progress Notes (Addendum)
Nutrition Follow-up  DOCUMENTATION CODES:   Morbid obesity  INTERVENTION:  Continue Ensure Enlive po BID, each supplement provides 350 kcal and 20 grams of protein  Continue encourage PO intake  NUTRITION DIAGNOSIS:   Increased nutrient needs related to acute illness, wound healing as evidenced by estimated needs.  Progressing with supplements  GOAL:   Patient will meet greater than or equal to 90% of their needs  Progressing  MONITOR:   PO intake, Supplement acceptance, Weight trends, Labs, Skin  REASON FOR ASSESSMENT:   Other (Comment)(Pressure Injury report)    ASSESSMENT:   68 year old female with history of HTN and osteoarthritis. She received a steroid injection into L hip in mid-November and shortly after she started having severe L hip pain. She developed fever 1 week ago which resolved and recently returned. She presented to the ED with L hip pain and urinary frequency. She was found to be hypotensive and highly febrile with temperature of 105 degrees F.  Pt on heart healthy diet since 12/30  Plans to d/c to Inpatient Rehab after functional mobility increases   Discussed pt with RN while pt with Physical therapy. RN reported pt eating depended on meals and if they brought foods she likes. Per the flowsheet, pt intake 75-100%. RN reported pt amenable with drinking ensure nutrition supplement some days but no others, it really depends on the day. RN reported no N/V and that her wounds were doing alright.  Pt alert and oriented when DI entered the room. Pt stated that she had eaten the entire [place] sandwich, 3/4 of cottage cheese and 1/2 of the [place] ice which is consistent with Per the flowsheet, pt intake 75-100%. DI encouraged pt to order meals that she likes. Pt reported that she didn't have an appetite and was making her self eat. Pt reported drinking the ensure but not really liking it. Pt amenable to changing to the butter pecan flavor. Pt stated nurse has  been putting it over ice the thin it down some bc she didn't like the thickness.  No N/V. Pt reported eat 2 meals a day at home, usually lunch and dinner but didn't elaborate on what types of foods. Pt normally has a good appetite at home.   UBW around 240#. Pt reported losing 60# for knee surgery in August 2019. Pt admit weight Current weight 246#.   Net Negative 4.4L.   DI encouraged pt to continue eating while in the hospital to maintain muscle mass and nutrition status.   Medications reviewed and include: Coumadin  Potassium Chloride  Protonix  Lasix  Acidophilus  Labs reviewed: BUN 24 (H)  CBGs: 105, 99, 98, 108, 132 X 12 hrs   Diet Order:   Diet Order            Diet Heart Room service appropriate? Yes; Fluid consistency: Thin  Diet effective now              EDUCATION NEEDS:   Education needs have been addressed  Skin:  Skin Assessment: Skin Integrity Issues: Skin Integrity Issues:: Incisions Stage II: sacrum Incisions: L hip (12/16)  Last BM:  1/6- Type 5  Height:   Ht Readings from Last 1 Encounters:  04/28/18 5\' 4"  (1.626 m)    Weight:   Wt Readings from Last 1 Encounters:  05/09/18 112.1 kg    Ideal Body Weight:  54.54 kg  BMI:  Body mass index is 42.42 kg/m.  Estimated Nutritional Needs:   Kcal:  1800-2000 kcals  Protein:  90-100 g  Fluid:  >/= 1.5 L/day    Mauricia Area, MS, Dietetic Intern Pager: [phone] After hours Pager: [phone]

## 2018-05-09 NOTE — Progress Notes (Signed)
Patient ID: Elizabeth [name], female   DOB: January 08, 1951, 68 y.o.   MRN: [id number] T[facility] Evening Rounds:  Hemodynamically stable. Remains in atrial flutter with vent rate 74.  Worked with PT/OT today.   Waiting on bed on 4E.

## 2018-05-10 ENCOUNTER — Inpatient Hospital Stay: Payer: Self-pay | Admitting: Internal Medicine

## 2018-05-10 LAB — BASIC METABOLIC PANEL
ANION GAP: 9 (ref 5–15)
BUN: 18 mg/dL (ref 8–23)
CO2: 30 mmol/L (ref 22–32)
Calcium: 8.3 mg/dL — ABNORMAL LOW (ref 8.9–10.3)
Chloride: 98 mmol/L (ref 98–111)
Creatinine, Ser: 0.84 mg/dL (ref 0.44–1.00)
GFR calc Af Amer: >60 mL/min (ref >60)
GFR calc non Af Amer: >60 mL/min (ref >60)
Glucose, Bld: 84 mg/dL (ref 70–99)
Potassium: 4 mmol/L (ref 3.5–5.1)
Sodium: 137 mmol/L (ref 135–145)

## 2018-05-10 LAB — PROTIME-INR
INR: 2.14
PROTHROMBIN TIME: 23.6 s — AB (ref 11.4–15.2)

## 2018-05-10 MED ORDER — FUROSEMIDE 80 MG PO TABS
80.0000 mg | ORAL_TABLET | Freq: Every day | ORAL | Status: DC
  Administered 2018-05-10 – 2018-05-11 (×2): 80 mg via ORAL
  Filled 2018-05-10 (×2): qty 1

## 2018-05-10 MED ORDER — POTASSIUM CHLORIDE CRYS ER 20 MEQ PO TBCR
40.0000 meq | EXTENDED_RELEASE_TABLET | Freq: Every day | ORAL | Status: DC
  Administered 2018-05-10 – 2018-05-11 (×2): 40 meq via ORAL
  Filled 2018-05-10 (×2): qty 2

## 2018-05-10 NOTE — Progress Notes (Addendum)
IP rehab admissions - Please see rehab consult done by Dr Posey Pronto recommending SNF placement.  Agree with need for SNF.  I will sign off for inpatient rehab at this time.  Call me for questions.  [phone]

## 2018-05-10 NOTE — Progress Notes (Signed)
11 Days Post-Op Procedure(s) (LRB): AORTIC VALVE REPLACEMENT (AVR) (N/A) MITRAL VALVE (MV) REPLACEMENT (N/A) Subjective: No complaints. Slept well  Objective: Vital signs in last 24 hours: Temp:  [98.6 F (37 C)-98.9 F (37.2 C)] 98.8 F (37.1 C) (01/08 0700) Pulse Rate:  [50-106] 81 (01/08 0800) Cardiac Rhythm: Atrial flutter (01/08 0800) Resp:  [19-32] 26 (01/08 0800) BP: (87-121)/(48-82) 107/67 (01/08 0800) SpO2:  [92 %-96 %] 93 % (01/08 0800)  Hemodynamic parameters for last 24 hours:    Intake/Output from previous day: 01/07 0701 - 01/08 0700 In: 382.9 [I.V.:32.8; IV Piggyback:350.1] Out: 2050 [Urine:2050] Intake/Output this shift: No intake/output data recorded.  General appearance: alert and cooperative Heart: regularly irregular rhythm, no murmur Lungs: clear to auscultation bilaterally Extremities: edema essentially resolved Wound: incision ok  Lab Results: No results for input(s): WBC, HGB, HCT, PLT in the last 72 hours. BMET:  Recent Labs    05/08/18 0308 05/10/18 0520  NA 136 137  K 4.3 4.0  CL 95* 98  CO2 37* 30  GLUCOSE 105* 84  BUN 24* 18  CREATININE 0.90 0.84  CALCIUM 8.1* 8.3*    PT/INR:  Recent Labs    05/10/18 0520  LABPROT 23.6*  INR 2.14   ABG    Component Value Date/Time   PHART 7.438 04/30/2018 0210   HCO3 26.0 04/30/2018 0210   TCO2 27 04/30/2018 1719   O2SAT 95.4 05/03/2018 0441   CBG (last 3)  No results for input(s): GLUCAP in the last 72 hours.  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  INR therapeutic and stable. Continue 2 mg Coumadin daily  Atrial flutter with controlled rate: continue amio and  lopressor.  Volume excess: edema resolved and wt only about 1 lb over admission wt. Will switch lasix to po.  IS, OOB  Physical therapy.  Awaiting transfer to 4E.   Social work for SNF planning in progress. I think she is medically stable when a bed is available.   LOS: 31 days    Gaye Pollack 05/10/2018

## 2018-05-11 DIAGNOSIS — Z452 Encounter for adjustment and management of vascular access device: Secondary | ICD-10-CM | POA: Diagnosis not present

## 2018-05-11 DIAGNOSIS — Y798 Miscellaneous orthopedic devices associated with adverse incidents, not elsewhere classified: Secondary | ICD-10-CM | POA: Diagnosis not present

## 2018-05-11 DIAGNOSIS — Z23 Encounter for immunization: Secondary | ICD-10-CM | POA: Diagnosis not present

## 2018-05-11 DIAGNOSIS — A419 Sepsis, unspecified organism: Secondary | ICD-10-CM | POA: Diagnosis not present

## 2018-05-11 DIAGNOSIS — Z7902 Long term (current) use of antithrombotics/antiplatelets: Secondary | ICD-10-CM | POA: Diagnosis not present

## 2018-05-11 DIAGNOSIS — Y838 Other surgical procedures as the cause of abnormal reaction of the patient, or of later complication, without mention of misadventure at the time of the procedure: Secondary | ICD-10-CM | POA: Diagnosis not present

## 2018-05-11 DIAGNOSIS — E559 Vitamin D deficiency, unspecified: Secondary | ICD-10-CM | POA: Diagnosis not present

## 2018-05-11 DIAGNOSIS — R404 Transient alteration of awareness: Secondary | ICD-10-CM | POA: Diagnosis not present

## 2018-05-11 DIAGNOSIS — I350 Nonrheumatic aortic (valve) stenosis: Secondary | ICD-10-CM | POA: Diagnosis not present

## 2018-05-11 DIAGNOSIS — I483 Typical atrial flutter: Secondary | ICD-10-CM | POA: Diagnosis not present

## 2018-05-11 DIAGNOSIS — M00052 Staphylococcal arthritis, left hip: Secondary | ICD-10-CM | POA: Diagnosis not present

## 2018-05-11 DIAGNOSIS — N170 Acute kidney failure with tubular necrosis: Secondary | ICD-10-CM | POA: Diagnosis not present

## 2018-05-11 DIAGNOSIS — R41841 Cognitive communication deficit: Secondary | ICD-10-CM | POA: Diagnosis not present

## 2018-05-11 DIAGNOSIS — I352 Nonrheumatic aortic (valve) stenosis with insufficiency: Secondary | ICD-10-CM | POA: Diagnosis not present

## 2018-05-11 DIAGNOSIS — R262 Difficulty in walking, not elsewhere classified: Secondary | ICD-10-CM | POA: Diagnosis not present

## 2018-05-11 DIAGNOSIS — M7989 Other specified soft tissue disorders: Secondary | ICD-10-CM | POA: Diagnosis not present

## 2018-05-11 DIAGNOSIS — R2681 Unsteadiness on feet: Secondary | ICD-10-CM | POA: Diagnosis not present

## 2018-05-11 DIAGNOSIS — M6281 Muscle weakness (generalized): Secondary | ICD-10-CM | POA: Diagnosis not present

## 2018-05-11 DIAGNOSIS — J9601 Acute respiratory failure with hypoxia: Secondary | ICD-10-CM | POA: Diagnosis not present

## 2018-05-11 DIAGNOSIS — E161 Other hypoglycemia: Secondary | ICD-10-CM | POA: Diagnosis not present

## 2018-05-11 DIAGNOSIS — I351 Nonrheumatic aortic (valve) insufficiency: Secondary | ICD-10-CM | POA: Diagnosis not present

## 2018-05-11 DIAGNOSIS — M009 Pyogenic arthritis, unspecified: Secondary | ICD-10-CM | POA: Diagnosis not present

## 2018-05-11 DIAGNOSIS — Z9889 Other specified postprocedural states: Secondary | ICD-10-CM | POA: Diagnosis not present

## 2018-05-11 DIAGNOSIS — A412 Sepsis due to unspecified staphylococcus: Secondary | ICD-10-CM | POA: Diagnosis not present

## 2018-05-11 DIAGNOSIS — Z7901 Long term (current) use of anticoagulants: Secondary | ICD-10-CM | POA: Diagnosis not present

## 2018-05-11 DIAGNOSIS — E785 Hyperlipidemia, unspecified: Secondary | ICD-10-CM | POA: Diagnosis not present

## 2018-05-11 DIAGNOSIS — M792 Neuralgia and neuritis, unspecified: Secondary | ICD-10-CM | POA: Diagnosis not present

## 2018-05-11 DIAGNOSIS — I10 Essential (primary) hypertension: Secondary | ICD-10-CM | POA: Diagnosis not present

## 2018-05-11 DIAGNOSIS — R4020 Unspecified coma: Secondary | ICD-10-CM | POA: Diagnosis not present

## 2018-05-11 DIAGNOSIS — I499 Cardiac arrhythmia, unspecified: Secondary | ICD-10-CM | POA: Diagnosis not present

## 2018-05-11 DIAGNOSIS — E162 Hypoglycemia, unspecified: Secondary | ICD-10-CM | POA: Diagnosis not present

## 2018-05-11 DIAGNOSIS — L89152 Pressure ulcer of sacral region, stage 2: Secondary | ICD-10-CM | POA: Diagnosis not present

## 2018-05-11 DIAGNOSIS — I330 Acute and subacute infective endocarditis: Secondary | ICD-10-CM | POA: Diagnosis not present

## 2018-05-11 DIAGNOSIS — I442 Atrioventricular block, complete: Secondary | ICD-10-CM | POA: Diagnosis not present

## 2018-05-11 DIAGNOSIS — I4892 Unspecified atrial flutter: Secondary | ICD-10-CM | POA: Diagnosis not present

## 2018-05-11 DIAGNOSIS — Z952 Presence of prosthetic heart valve: Secondary | ICD-10-CM | POA: Diagnosis not present

## 2018-05-11 DIAGNOSIS — R7881 Bacteremia: Secondary | ICD-10-CM | POA: Diagnosis not present

## 2018-05-11 DIAGNOSIS — I2510 Atherosclerotic heart disease of native coronary artery without angina pectoris: Secondary | ICD-10-CM | POA: Diagnosis not present

## 2018-05-11 LAB — PROTIME-INR
INR: 2.13
Prothrombin Time: 23.6 seconds — ABNORMAL HIGH (ref 11.4–15.2)

## 2018-05-11 LAB — GLUCOSE, CAPILLARY: Glucose-Capillary: 89 mg/dL (ref 70–99)

## 2018-05-11 MED ORDER — FUROSEMIDE 40 MG PO TABS: 40.0000 mg | ORAL_TABLET | Freq: Every day | ORAL | Status: DC

## 2018-05-11 MED ORDER — ASPIRIN 81 MG PO TBEC: 81.0000 mg | DELAYED_RELEASE_TABLET | Freq: Every day | ORAL | Status: AC

## 2018-05-11 MED ORDER — WARFARIN SODIUM 2 MG PO TABS: 2.0000 mg | ORAL_TABLET | Freq: Every day | ORAL | 1 refills | Status: AC

## 2018-05-11 MED ORDER — METOPROLOL TARTRATE 25 MG PO TABS: 12.5000 mg | ORAL_TABLET | Freq: Two times a day (BID) | ORAL | Status: DC

## 2018-05-11 MED ORDER — POTASSIUM CHLORIDE CRYS ER 20 MEQ PO TBCR: 20.0000 meq | EXTENDED_RELEASE_TABLET | Freq: Every day | ORAL | Status: DC

## 2018-05-11 MED ORDER — CEFAZOLIN IV (FOR PTA / DISCHARGE USE ONLY): 2.0000 g | Freq: Three times a day (TID) | INTRAVENOUS | 0 refills | Status: DC

## 2018-05-11 MED ORDER — HEPARIN SOD (PORK) LOCK FLUSH 100 UNIT/ML IV SOLN
250.0000 [IU] | INTRAVENOUS | Status: AC | PRN
  Administered 2018-05-11: 250 [IU]

## 2018-05-11 MED ORDER — RISAQUAD PO CAPS: 1.0000 | ORAL_CAPSULE | Freq: Two times a day (BID) | ORAL | Status: DC

## 2018-05-11 MED ORDER — CEFAZOLIN SODIUM-DEXTROSE 2-4 GM/100ML-% IV SOLN: 2.0000 g | Freq: Three times a day (TID) | INTRAVENOUS | 0 refills | Status: DC

## 2018-05-11 MED ORDER — CEFAZOLIN SODIUM-DEXTROSE 2-4 GM/100ML-% IV SOLN: 2.0000 g | Freq: Three times a day (TID) | INTRAVENOUS | 0 refills | Status: AC

## 2018-05-11 MED ORDER — CEFAZOLIN SODIUM-DEXTROSE 2-4 GM/100ML-% IV SOLN: 2.0000 g | Freq: Three times a day (TID) | INTRAVENOUS | 20 refills | Status: DC

## 2018-05-11 MED ORDER — RIFAMPIN 300 MG PO CAPS: 300.0000 mg | ORAL_CAPSULE | Freq: Two times a day (BID) | ORAL | 0 refills | Status: DC

## 2018-05-11 MED ORDER — OXYCODONE HCL 5 MG PO TABS: ORAL_TABLET | ORAL | 0 refills | Status: DC

## 2018-05-11 MED ORDER — RIFAMPIN 300 MG PO CAPS: 300.0000 mg | ORAL_CAPSULE | Freq: Two times a day (BID) | ORAL | 0 refills | Status: AC

## 2018-05-11 MED ORDER — WARFARIN SODIUM 2 MG PO TABS: 2.0000 mg | ORAL_TABLET | Freq: Every day | ORAL | 1 refills | Status: DC

## 2018-05-11 MED ORDER — ENSURE ENLIVE PO LIQD: 237.0000 mL | Freq: Two times a day (BID) | ORAL | 12 refills | Status: AC

## 2018-05-11 MED ORDER — AMIODARONE HCL 200 MG PO TABS: 200.0000 mg | ORAL_TABLET | Freq: Every day | ORAL | Status: AC

## 2018-05-11 MED ORDER — ASPIRIN EC 81 MG PO TBEC
81.0000 mg | DELAYED_RELEASE_TABLET | Freq: Every day | ORAL | Status: DC
  Administered 2018-05-11: 81 mg via ORAL
  Filled 2018-05-11: qty 1

## 2018-05-11 NOTE — Clinical Social Work Placement (Signed)
   CLINICAL SOCIAL WORK PLACEMENT  NOTE  Date:  05/11/2018  Patient Details  Name: Elizabeth [name] MRN: [id number] Date of Birth: [birth year]  Clinical Social Work is seeking post-discharge placement for this patient at the [place] level of care (*CSW will initial, date and re-position this form in  chart as items are completed):  Yes   Patient/family provided with [facility] Work Department's list of facilities offering this level of care within the geographic area requested by the patient (or if unable, by the patient's family).  Yes   Patient/family informed of their freedom to choose among providers that offer the needed level of care, that participate in Medicare, Medicaid or managed care program needed by the patient, have an available bed and are willing to accept the patient.  Yes   Patient/family informed of [facility]'s ownership interest in [facility] and [facility], as well as of the fact that they are under no obligation to receive care at these facilities.  PASRR submitted to EDS on       PASRR number received on 04/27/18     Existing PASRR number confirmed on       FL2 transmitted to all facilities in geographic area requested by pt/family on       FL2 transmitted to all facilities within larger geographic area on       Patient informed that his/her managed care company has contracts with or will negotiate with certain facilities, including the following:        Yes   Patient/family informed of bed offers received.  Patient chooses bed at [facility] and [facility] recommends and patient chooses bed at      Patient to be transferred to [facility] on  .  Patient to be transferred to facility by [facility]     Patient family notified on 05/11/18 of transfer.  Name of family member notified:  Ebony Hail, daughter     PHYSICIAN       Additional Comment:     _______________________________________________ Vinie Sill, [place] 05/11/2018, 2:47 PM

## 2018-05-11 NOTE — Progress Notes (Signed)
CARDIAC REHAB PHASE I   Brief ed completed with pt. Pt instructed to shower and monitor incisions daily. Encouraged continued IS use, walks, and sternal precautions. Pt given in-the-tube sheet, and heart healthy diet. Reviewed restrictions, and encouraged mobility as safely as able. Provided support and encouragement to pt. Pt states she is supposed to go to rehab after discharge, but has not been given a list of facilities to pick from. RN made aware.  [phone] Rufina Falco, RN BSN 05/11/2018 10:18 AM

## 2018-05-11 NOTE — Progress Notes (Signed)
CSW talked with patient and her daughter and confirmed will be going to [facility] once she is discharged. PA-C has been updated on SNF status.    Thurmond Butts, [place] Social Worker [phone]

## 2018-05-11 NOTE — Progress Notes (Signed)
Patient will DC to: [facility] Date:05/11/2018 Family Notified: Ebony Hail, daughter Transport AE:[facility]  RN, patient, and facility notified of DC. Discharge Summary sent to facility. RN given number for [phone]. DC packet on chart. Ambulance transport requested for patient.   Clinical Social Worker signing off. Thurmond Butts, [place] Social Worker [phone]

## 2018-05-11 NOTE — Progress Notes (Addendum)
      [address]       [place] [address]             [phone]        12 Days Post-Op Procedure(s) (LRB): AORTIC VALVE REPLACEMENT (AVR) (N/A) MITRAL VALVE (MV) REPLACEMENT (N/A)  Subjective: Patient's only complaint is a feeling of "being in a binder" across chest.  Objective: Vital signs in last 24 hours: Temp:  [97.9 F (36.6 C)-98.7 F (37.1 C)] 98.3 F (36.8 C) (01/09 0425) Pulse Rate:  [75-97] 89 (01/09 0425) Cardiac Rhythm: Atrial flutter (01/09 0703) Resp:  [17-33] 33 (01/09 0425) BP: (101-126)/(54-72) 107/62 (01/09 0425) SpO2:  [93 %-95 %] 93 % (01/09 0425) Weight:  [109.5 kg-110.2 kg] 110.2 kg (01/09 0425)  Pre op weight 113 kg Current Weight  05/11/18 110.2 kg       Intake/Output from previous day: 01/08 0701 - 01/09 0700 In: 640.1 [P.O.:340; IV Piggyback:300.1] Out: 200 [Urine:200]   Physical Exam:  Cardiovascular: IRRR IRRR Pulmonary: Clear to auscultation bilaterally Abdomen: Soft, non tender, bowel sounds present. Extremities: Mild bilateral lower extremity edema. Wounds: Clean and dry.  No erythema or signs of infection.  Lab Results: CBC:No results for input(s): WBC, HGB, HCT, PLT in the last 72 hours. BMET:  Recent Labs    05/10/18 0520  NA 137  K 4.0  CL 98  CO2 30  GLUCOSE 84  BUN 18  CREATININE 0.84  CALCIUM 8.3*    PT/INR:  Lab Results  Component Value Date   INR 2.13 05/11/2018   INR 2.14 05/10/2018   INR 2.29 05/09/2018   ABG:  INR: Will add last result for INR, ABG once components are confirmed Will add last 4 CBG results once components are confirmed  Assessment/Plan:  1. CV - A flutter with controlled rate. On Amiodarone 200 mg daily, Lopressor 12.5 mg bid, and Coumadin. INR 2.13 so will continue with 2 mg of Coumadin. Will start low dose ecasa 2.  Pulmonary - On room air. Encourage incentive spirometer.  3. Volume Overload - On Lasix 80 mg daily-will discuss with Dr. Cyndia Bent if will need  this dose at discharge 4.  Acute blood loss anemia - Last H and H 9.5 and 31.7 5. ID-on Ancefand Rifampin for Staph Lugdunensis AV endocarditis;per ID, will need for 6 weeks 6. Declined for CIR so will need SNF;waiting for bed  Pranathi Winfree M ZimmermanPA-C 05/11/2018,7:57 AM [phone]

## 2018-05-11 NOTE — Progress Notes (Signed)
[facility] arrived to transport pt to [facility]. AVS already provided to pt and all questions answered.

## 2018-05-11 NOTE — Care Management Important Message (Signed)
Important Message  Patient Details  Name: VANESA RENIER MRN: [id number] Date of Birth: 30-Sep-1950   Medicare Important Message Given:  Yes    Barb Merino Cortlandt Capuano 05/11/2018, 4:27 PM

## 2018-05-12 ENCOUNTER — Non-Acute Institutional Stay (SKILLED_NURSING_FACILITY): Payer: Medicare Other | Admitting: Internal Medicine

## 2018-05-12 ENCOUNTER — Encounter: Payer: Self-pay | Admitting: Internal Medicine

## 2018-05-12 DIAGNOSIS — Z9889 Other specified postprocedural states: Secondary | ICD-10-CM

## 2018-05-12 DIAGNOSIS — Z952 Presence of prosthetic heart valve: Secondary | ICD-10-CM | POA: Diagnosis not present

## 2018-05-12 DIAGNOSIS — A419 Sepsis, unspecified organism: Secondary | ICD-10-CM

## 2018-05-12 DIAGNOSIS — J9601 Acute respiratory failure with hypoxia: Secondary | ICD-10-CM | POA: Diagnosis not present

## 2018-05-12 DIAGNOSIS — I483 Typical atrial flutter: Secondary | ICD-10-CM | POA: Diagnosis not present

## 2018-05-12 DIAGNOSIS — A412 Sepsis due to unspecified staphylococcus: Secondary | ICD-10-CM

## 2018-05-12 DIAGNOSIS — I352 Nonrheumatic aortic (valve) stenosis with insufficiency: Secondary | ICD-10-CM | POA: Diagnosis not present

## 2018-05-12 DIAGNOSIS — I330 Acute and subacute infective endocarditis: Secondary | ICD-10-CM

## 2018-05-12 DIAGNOSIS — N170 Acute kidney failure with tubular necrosis: Secondary | ICD-10-CM | POA: Diagnosis not present

## 2018-05-12 DIAGNOSIS — B958 Unspecified staphylococcus as the cause of diseases classified elsewhere: Secondary | ICD-10-CM

## 2018-05-12 DIAGNOSIS — E559 Vitamin D deficiency, unspecified: Secondary | ICD-10-CM

## 2018-05-12 DIAGNOSIS — I442 Atrioventricular block, complete: Secondary | ICD-10-CM | POA: Diagnosis not present

## 2018-05-12 DIAGNOSIS — I351 Nonrheumatic aortic (valve) insufficiency: Secondary | ICD-10-CM | POA: Diagnosis not present

## 2018-05-12 DIAGNOSIS — M00052 Staphylococcal arthritis, left hip: Secondary | ICD-10-CM | POA: Diagnosis not present

## 2018-05-12 DIAGNOSIS — I4819 Other persistent atrial fibrillation: Secondary | ICD-10-CM

## 2018-05-12 DIAGNOSIS — R6521 Severe sepsis with septic shock: Secondary | ICD-10-CM

## 2018-05-12 NOTE — Progress Notes (Signed)
:  Location:  [place] Room Number: 263Z Place of Service:  SNF (31)  Makinzee Durley D. Sheppard Coil, MD  Patient Care Team: Carollee Herter, Alferd Apa, DO as PCP - General Skeet Latch, MD as PCP - Cardiology (Cardiology) Suella Broad, MD as Consulting Physician (Physical Medicine and Rehabilitation) Curt Bears, MD as Consulting Physician (Oncology) Harriett Sine, MD as Consulting Physician (Dermatology) Delila Pereyra, MD as Consulting Physician (Gynecology) Kelton Pillar, Junita Push, LCSW as Social Worker (Licensed Clinical Social Worker) Harriett Sine, MD as Consulting Physician (Dermatology)  Extended Emergency Contact Information Primary Emergency Contact: [place], [place] [address] Johnnette Litter of [place] Phone: [phone] Work Phone: [phone] Mobile Phone: [phone] Relation: Daughter Secondary Emergency Contact: B, [place] Phone: [phone] Relation: Daughter     Allergies: Penicillins  Chief Complaint  Patient presents with  . New Admit To SNF    Admit to [facility]    HPI: Patient is 68 y.o. female history of mild aortic stenosis, hypertension, hyperlipidemia, and history of lung cancer status post lobectomy by Dr. Arlyce Dice in 2010.  She recently had a knee replacement in August 2019.  She had been having her left hip injected with steroids at [facility] radiology in the middle of November.  Within the next 2 weeks she developed fever and felt weak.  She presented to [facility] long ED on 12/8 at which time she was found to be febrile at 105 and hypotensive.  She had positive blood cultures for staph lugdunensis and echo showed calcific aortic arch with possible vegetation.  Patient was admitted to [facility] long hospital and then [facility] from 12/8-1/9 she was also diagnosed with septic arthritis of her left hip.  On 12/16 patient underwent irrigation and debridement in the operating room of her  hip and cultures grew out the same bacteria.  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  EF was conserved.  She was treated antibiotics as recommended by ID and cared for by cardiology and internal medicine at [facility].  Unfortunately she developed acute symptomatic bradycardia with worsening shortness of breath requiring intubation she was treated for acute heart failure with aggressive diuretics and was able to be extubated and then was transferred to [facility] for further care.  At [facility] she underwent cardiac cath which showed no significant coronary artery disease.  It did show elevated PA pressures.  She remained hypotensive despite Neo-Synephrine drip.  She was again treated with IV Lasix with some symptomatic improvements.  It was felt in surgical intervention would be required NT CTS was consulted.  Patient was taken to the operating room on 12/28 where she underwent mitral valve repair and aortic valve replacement.  She tolerated the procedure without difficulty and was taken to the SICU in stable condition.  Patient extubated herself overnight.  During her stay in the ICU patient was started on Ancef per ID recommendations.  She would require 6 weeks of therapy postop.  She was weaned off milrinone and Levophed.  She did develop atrial flutter and rapid atrial pacing was attempted but unsuccessful.  She was started on amiodarone therapy to try and achieve sinus rhythm she was started on low-dose Coumadin for her mitral valve repair.  Due to antibiotic use and amrinone use caution is required as patient is quite sensitive  to Coumadin.  She was treated aggressively with IV diuretics for volume overload status.  She was monitored by cardiology who felt was best to try and control her rate with Lopressor and anticoagulation if she remains in  atrial flutter she may require cardioversion in the future.  However rate control has been achieved she is very deconditioned and will require aggressive PT OT patient will require 6 weeks of therapy post surgery with an end date of 06/09/2018.  She will be discharged on 2 mg of Coumadin daily with an INR goal of 2.5-3.0.  Patient is admitted to skilled nursing facility for OT/PT.  Past Medical History:  Diagnosis Date  . Aortic stenosis, mild 01/08/2015  . Dyspnea    with rest   . Dysrhythmia   . Hx of cancer of lung 6/10   Removed June 2010  . Hyperlipidemia   . Hypertension   . Low back pain   . Morbid obesity ([place]) 01/08/2015  . Right foot injury    report she sustained a fracture to her right , present with ortho boot on affected extremity   . Vitamin D deficiency     Past Surgical History:  Procedure Laterality Date  . ANKLE FUSION Left   . AORTIC VALVE REPLACEMENT N/A 04/29/2018   Procedure: AORTIC VALVE REPLACEMENT (AVR);  Surgeon: Gaye Pollack, MD;  Location: [place];  Service: Open Heart Surgery;  Laterality: N/A;  . CHOLECYSTECTOMY    . INCISION AND DRAINAGE HIP Left 04/17/2018   Procedure: IRRIGATION AND DEBRIDEMENT LEFT HIP (ANTERIOR);  Surgeon: Gaynelle Arabian, MD;  Location: [facility];  Service: Orthopedics;  Laterality: Left;  . LUNG REMOVAL, PARTIAL    . MITRAL VALVE REPLACEMENT N/A 04/29/2018   Procedure: MITRAL VALVE (MV) REPLACEMENT;  Surgeon: Gaye Pollack, MD;  Location: [place];  Service: Open Heart Surgery;  Laterality: N/A;  . RIGHT/LEFT HEART CATH AND CORONARY ANGIOGRAPHY N/A 04/28/2018   Procedure: RIGHT/LEFT HEART CATH AND CORONARY ANGIOGRAPHY;  Surgeon: Jolaine Artist, MD;  Location: [place] CV LAB;  Service: Cardiovascular;  Laterality: N/A;  . TEE WITHOUT CARDIOVERSION     04/17/2018  . TEE WITHOUT CARDIOVERSION N/A 04/17/2018   Procedure: TRANSESOPHAGEAL ECHOCARDIOGRAM (TEE);  Surgeon: Larey Dresser, MD;  Location: [facility] ENDOSCOPY;  Service:  Cardiovascular;  Laterality: N/A;  . TOTAL KNEE ARTHROPLASTY Right 12/12/2017   Procedure: RIGHT TOTAL KNEE ARTHROPLASTY;  Surgeon: Gaynelle Arabian, MD;  Location: [facility];  Service: Orthopedics;  Laterality: Right;  . TUBAL LIGATION      Allergies as of 05/12/2018      Reactions   Penicillins Other (See Comments)   Fever -when she was 68 yrs old Has patient had a PCN reaction causing immediate rash, facial/tongue/throat swelling, SOB or lightheadedness with hypotension: NO Has patient had a PCN reaction causing severe rash involving mucus membranes or skin necrosis: Unknown Has patient had a PCN reaction that required hospitalization: Unknown Has patient had a PCN reaction occurring within the last 10 years: No Childhood allergy If all of the above answers are "NO", then may proceed with Cephalosporin use.      Medication List       Accurate as of May 12, 2018  9:44 AM. Always use your most recent med list.        acidophilus Caps capsule Take 1 capsule by mouth 2 (two) times daily.   amiodarone 200 MG tablet Commonly known as:  PACERONE Take 1 tablet (200 mg total) by mouth  daily.   aspirin 81 MG EC tablet Take 1 tablet (81 mg total) by mouth daily.   ceFAZolin 2-4 GM/100ML-% IVPB Commonly known as:  ANCEF Inject 100 mLs (2 g total) into the vein every 8 (eight) hours.   DIALYVITE VITAMIN D 5000 125 MCG (5000 UT) capsule Generic drug:  Cholecalciferol Take 5,000 Units by mouth daily.   feeding supplement (ENSURE ENLIVE) Liqd Take 237 mLs by mouth 2 (two) times daily between meals.   furosemide 40 MG tablet Commonly known as:  LASIX Take 1 tablet (40 mg total) by mouth daily.   HAIR SKIN AND NAILS FORMULA Tabs Take 2 tablets by mouth daily.   metoprolol tartrate 25 MG tablet Commonly known as:  LOPRESSOR Take 0.5 tablets (12.5 mg total) by mouth 2 (two) times daily.   oxyCODONE 5 MG immediate release tablet Commonly known as:  Oxy IR/ROXICODONE Take 5 mg  every 4-6 hours PRN severe pain   potassium chloride SA 20 MEQ tablet Commonly known as:  K-DUR,KLOR-CON Take 1 tablet (20 mEq total) by mouth daily.   rifampin 300 MG capsule Commonly known as:  RIFADIN Take 1 capsule (300 mg total) by mouth every 12 (twelve) hours.   warfarin 2 MG tablet Commonly known as:  COUMADIN Take 1 tablet (2 mg total) by mouth daily at 6 PM. Or as directed       No orders of the defined types were placed in this encounter.   Immunization History  Administered Date(s) Administered  . Influenza Whole 04/06/2007  . Influenza, High Dose Seasonal PF 02/14/2017, 03/06/2018  . Influenza,inj,Quad PF,6+ Mos 01/20/2015, 03/09/2016  . Influenza-Unspecified 03/08/2012, 03/08/2014  . Pneumococcal Conjugate-13 10/17/2014  . Pneumococcal Polysaccharide-23 12/08/2015, 04/10/2018  . Tdap 10/11/2011  . Zoster 10/25/2011    Social History   Tobacco Use  . Smoking status: Former Smoker    Types: Cigarettes    Last attempt to quit: 02/01/2005    Years since quitting: 13.2  . Smokeless tobacco: Never Used  Substance Use Topics  . Alcohol use: No    Alcohol/week: 0.0 standard drinks    Family history is   Family History  Problem Relation Age of Onset  . Hypertension Mother   . Stroke Mother   . Cancer Mother        lung  . Heart disease Father        cabg  . Cancer Brother        skin      Review of Systems  DATA OBTAINED: from patient, nurse GENERAL:  no fevers, fatigue, appetite changes SKIN: No itching, or rash EYES: No eye pain, redness, discharge EARS: No earache, tinnitus, change in hearing NOSE: No congestion, drainage or bleeding  MOUTH/THROAT: No mouth or tooth pain, No sore throat RESPIRATORY: No cough, wheezing, SOB CARDIAC: No chest pain, palpitations, lower extremity edema  GI: No abdominal pain, No N/V/D or constipation, No heartburn or reflux  GU: No dysuria, frequency or urgency, or incontinence  MUSCULOSKELETAL: No  unrelieved bone/joint pain NEUROLOGIC: No headache, dizziness or focal weakness PSYCHIATRIC: No c/o anxiety or sadness   Vitals:   05/12/18 0942  BP: 116/69  Pulse: (!) 112  Resp: 18  Temp: 98.2 F (36.8 C)    SpO2 Readings from Last 1 Encounters:  05/11/18 93%   Body mass index is 41.71 kg/m.     Physical Exam  GENERAL APPEARANCE: Alert, conversant,  No acute distress.  SKIN: No diaphoresis rash HEAD: Normocephalic, atraumatic  EYES: Conjunctiva/lids clear. Pupils round, reactive. EOMs intact.  EARS: External exam WNL, canals clear. Hearing grossly normal.  NOSE: No deformity or discharge.  MOUTH/THROAT: Lips w/o lesions  RESPIRATORY: Breathing is even, unlabored. Lung sounds are clear   CARDIOVASCULAR: Heart irregular, 3/6 murmur no  rubs or gallops. No peripheral edema.   GASTROINTESTINAL: Abdomen is soft, non-tender, not distended w/ normal bowel sounds. GENITOURINARY: Bladder non tender, not distended  MUSCULOSKELETAL: No abnormal joints or musculature NEUROLOGIC:  Cranial nerves 2-12 grossly intact. Moves all extremities  PSYCHIATRIC: Mood and affect appropriate to situation, no behavioral issues  Patient Active Problem List   Diagnosis Date Noted  . Benign essential HTN   . History of lung cancer   . Tachypnea   . Leukocytosis   . Acute blood loss anemia   . Staphylococcal sepsis ([place])   . S/P AVR (aortic valve replacement) 04/29/2018  . S/P MVR (mitral valve repair) 04/29/2018  . Abdominal pain   . Typical atrial flutter ([place])   . Acute pulmonary edema (HCC)   . Acute respiratory failure ([place])   . Cardiac arrest, cause unspecified ([place])   . Cardiogenic shock ([place])   . Atrioventricular block, complete ([place])   . Severe aortic regurgitation   . SVT (supraventricular tachycardia) ([place])   . 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 Infective endocarditis   . Thrombocytopenia ([place])   . Anemia of chronic disease   . Chest tightness   . Fever   . Pressure  injury of skin 04/10/2018  . Septic arthritis ([place]) 04/09/2018  . Acute kidney injury (AKI) with acute tubular necrosis (ATN) (HCC)   . Arterial hypotension   . Sepsis with acute organ dysfunction and septic shock (HCC)   . OA (osteoarthritis) of knee 12/12/2017  . Idiopathic chronic venous hypertension of both lower extremities with inflammation 06/29/2016  . Subtalar joint instability, left 06/29/2016  . Arthritis of right knee 01/30/2016  . Lower extremity edema 10/21/2015  . Leg cramps 08/21/2015  . Left ankle pain 04/09/2015  . Anxiety 01/20/2015  . Nonrheumatic aortic insufficiency with aortic stenosis 01/08/2015  . Obesity, Class III, BMI 40-49.9 (morbid obesity) ([place]) 01/08/2015  . Varicose veins 01/02/2015  . Snoring 12/06/2014  . Viral gastroenteritis 04/06/2013  . Insect bite of ankle, right, infected 07/31/2012  . Primary cancer of right upper lobe of lung ([place]) 10/18/2011  . Left hip pain 06/29/2011  . Pain in joint, lower leg 03/17/2009  . GASTROENTERITIS 07/11/2008  . PULMONARY NODULE, SOLITARY 04/18/2008  . LIVER FUNCTION TESTS, ABNORMAL, HX OF 04/01/2008  . B12 DEFICIENCY 11/13/2007  . COUGH 11/13/2007  . UNSPECIFIED VITAMIN D DEFICIENCY 11/07/2007  . TINEA PEDIS 07/07/2007  . LYMPHADENOPATHY 11/16/2006  . Hyperlipidemia 09/15/2006  . Essential hypertension 09/15/2006  . LOW BACK PAIN 09/15/2006      Labs reviewed: Basic Metabolic Panel:    Component Value Date/Time   NA 137 05/10/2018 0520   NA 143 01/05/2017 1131   K 4.0 05/10/2018 0520   K 4.4 01/05/2017 1131   CL 98 05/10/2018 0520   CL 104 10/02/2012 0949   CO2 30 05/10/2018 0520   CO2 31 (H) 01/05/2017 1131   GLUCOSE 84 05/10/2018 0520   GLUCOSE 99 01/05/2017 1131   GLUCOSE 104 (H) 10/02/2012 0949   BUN 18 05/10/2018 0520   BUN 10.2 01/05/2017 1131   CREATININE 0.84 05/10/2018 0520   CREATININE 0.83 11/11/2017 1636   CREATININE 0.9 01/05/2017 1131   CALCIUM 8.3 (L)  05/10/2018 0520    CALCIUM 10.2 01/05/2017 1131   PROT 5.6 (L) 05/05/2018 0423   PROT 7.6 01/05/2017 1131   ALBUMIN 2.3 (L) 05/05/2018 0423   ALBUMIN 3.7 01/05/2017 1131   AST 24 05/05/2018 0423   AST 20 01/05/2017 1131   ALT 8 05/05/2018 0423   ALT 13 01/05/2017 1131   ALKPHOS 58 05/05/2018 0423   ALKPHOS 80 01/05/2017 1131   BILITOT 2.1 (H) 05/05/2018 0423   BILITOT 0.67 01/05/2017 1131   GFRNONAA >60 05/10/2018 0520   GFRAA >60 05/10/2018 0520    Recent Labs    04/25/18 0411 04/25/18 1625  04/27/18 0516  04/29/18 2239 04/30/18 0334 04/30/18 1712  05/07/18 0406 05/08/18 0308 05/10/18 0520  NA 140  --    < > 138   < >  --  137  --    < > 134* 136 137  K 3.2*  --    < > 4.2   < >  --  3.8  --    < > 3.9 4.3 4.0  CL 95*  --    < > 95*   < >  --  102  --    < > 91* 95* 98  CO2 36*  --    < > 34*   < >  --  25  --    < > 33* 37* 30  GLUCOSE 106*  --    < > 109*   < >  --  112*  --    < > 142* 105* 84  BUN 27*  --    < > 29*   < >  --  27*  --    < > 18 24* 18  CREATININE 0.80  --    < > 1.00   < > 0.89 1.01* 0.87   < > 0.98 0.90 0.84  CALCIUM 8.0*  --    < > 8.0*   < >  --  7.3*  --    < > 8.2* 8.1* 8.3*  MG 1.8 2.3  --  2.3  --  2.9* 2.8* 2.2  --   --   --   --   PHOS 4.0 3.5  --  3.7  --   --   --   --   --   --   --   --    < > = values in this interval not displayed.   Liver Function Tests: Recent Labs    04/26/18 0430 05/02/18 0647 05/05/18 0423  AST 16 153* 24  ALT 8 55* 8  ALKPHOS 41 94 58  BILITOT 1.3* 2.6* 2.1*  PROT 5.8* 5.8* 5.6*  ALBUMIN 2.5* 2.8* 2.3*   No results for input(s): LIPASE, AMYLASE in the last 8760 hours. No results for input(s): AMMONIA in the last 8760 hours. CBC: Recent Labs    04/16/18 0403  04/19/18 0500  05/01/18 0437  05/03/18 0432 05/06/18 0609 05/07/18 0406  WBC 17.8*   < > 13.2*   < > 13.4*   < > 12.8* 10.1 10.9*  NEUTROABS 14.6*  --  10.5*  --  10.7*  --   --   --   --   HGB 9.3*   < > 8.1*   < > 9.6*   < > 10.5* 10.0* 9.5*  HCT 30.0*    < > 27.0*   < > 31.2*   < > 32.9* 33.3* 31.7*  MCV 97.7   < >  100.7*   < > 93.7   < > 91.4 94.6 95.5  PLT 64*   < > 71*   < > 63*   < > 113* 146* 178   < > = values in this interval not displayed.   Lipid Recent Labs    11/11/17 1636  CHOL 141  HDL 35*  LDLCALC 85  TRIG 114    Cardiac Enzymes: Recent Labs    04/16/18 2320 04/17/18 0503 04/17/18 1105  TROPONINI 0.05* 0.05* 0.06*   BNP: No results for input(s): BNP in the last 8760 hours. No results found for: [facility] Lab Results  Component Value Date   HGBA1C 5.5 11/23/2017   Lab Results  Component Value Date   TSH 1.031 04/12/2018   Lab Results  Component Value Date   VITAMINB12 507 04/20/2018   Lab Results  Component Value Date   FOLATE 10.8 04/20/2018   Lab Results  Component Value Date   IRON 16 (L) 04/20/2018   TIBC 273 04/20/2018   FERRITIN 128 04/20/2018    Imaging and Procedures obtained prior to SNF admission: Dg Chest 2 View  Result Date: 04/09/2018 CLINICAL DATA:  Fever. Status post right knee replacement on November 11th. EXAM: CHEST - 2 VIEW COMPARISON:  CT chest dated 01/20/2018 FINDINGS: Trace right pleural effusion, chronic. Left lung is clear. No pneumothorax. Surgical clips in the right perihilar region. The heart is normal in size. Visualized osseous structures are within normal limits. IMPRESSION: Postsurgical changes right hemithorax with chronic trace right pleural effusion. No evidence of acute cardiopulmonary disease. Electronically Signed   By: Julian Hy M.D.   On: 04/09/2018 17:02   Dg Arthro Hip Left  Result Date: 04/11/2018 INDICATION: Severe left hip pain.  Clinical concern for septic arthritis. EXAM: ARTHROCENTESIS OF LEFT HIP JOINT UNDER FLUOROSCOPIC GUIDANCE COMPARISON:  Radiographs 04/09/2018 and MRI 04/04/2018 FLUOROSCOPY TIME:  Fluoroscopy Time:  54 seconds Radiation Exposure Index (if provided by the fluoroscopic device): 17.5 mGy Number of Acquired Spot Images: 0  COMPLICATIONS: None immediate. PROCEDURE: Informed written consent was obtained from the patient after discussion of the risks, benefits and alternatives to treatment. The patient was placed prone on the fluoroscopy table and the left extremity was placed in a slight degree of flexion and internal rotation. The left hip was localized with fluoroscopy. The skin overlying the anterior aspect of the hip was prepped and draped in usual sterile fashion. A 22 gauge spinal needle was advanced into the hip joint at the lateral aspect of the femoral head-neck junction, after the overlying soft tissues were anesthetized with 1% lidocaine. No fluid could be aspirated. Subsequently, 2 cc Isovue-300 was injected into the joint to document intra-articular location. A fluoroscopic image was saved and sent to PACs. 2 cc of fluid was then aspirated from the left hip joint. The aspirated fluid was sent for laboratory evaluation ordered by referring physician. The needle was removed and a dressing was placed. The patient tolerated procedure well without immediate postprocedural complication. IMPRESSION: Successful fluoroscopic guided aspiration of the left hip. Electronically Signed   By: Marijo Sanes M.D.   On: 04/11/2018 07:55   Dg Chest Port 1 View  Result Date: 04/10/2018 CLINICAL DATA:  Chest tightness EXAM: PORTABLE CHEST 1 VIEW COMPARISON:  04/09/2018 FINDINGS: Cardiac shadow is stable. Postsurgical changes are noted on the right. The lungs are clear. Persistent blunting of the right costophrenic angle is noted. No pneumothorax is seen. IMPRESSION: Stable blunting of the right costophrenic angle.  Postoperative changes without pneumothorax. Electronically Signed   By: Inez Catalina M.D.   On: 04/10/2018 16:32   Dg Hip Unilat W Or Wo Pelvis 2-3 Views Left  Result Date: 04/09/2018 CLINICAL DATA:  Left hip pain. EXAM: DG HIP (WITH OR WITHOUT PELVIS) 2-3V LEFT COMPARISON:  Radiographs 12020/05/317 FINDINGS: Bilateral hip joint  degenerative changes severe on the left and moderately severe on the right. There is joint space narrowing and osteophytic spurring. No definite fracture or AVN. The pubic symphysis and SI joints are intact. No pelvic fractures or bone lesions. IMPRESSION: Severe left and moderately severe right hip joint degenerative changes but no definite fracture or AVN. Electronically Signed   By: Marijo Sanes M.D.   On: 04/09/2018 17:00     Not all labs, radiology exams or other studies done during hospitalization come through on my EPIC note; however they are reviewed by me.    Assessment and Plan  Staph bacteremia/Endocarditis/septic arthritis left hip/septic shock/aortic insufficiency- blood cultures and hip cultures grew out staph lugdenensis; endocarditis diagnosed via TEE with aortic valve endocarditis and severe eccentric aortic insufficiency and probable perforation of the aortic leaflets with preserved ejection fraction.  Patient was started on IV antibiotics and will require 6 weeks of Ancef postop SNF- patient is admitted for OT/PT and we will continue Ancef for 6 weeks from 12/28 with an end date of 06/09/2018 and according discharge medication list on mention patient will be on rifampin 300 mg every 12 hours presumably for 6 weeks as well  Status post aortic valve replacement/mitral valve repair/atrial flutter/warfarin anticoagulation- tolerated procedure very well and self extubated overnight; she was started on her 6-week therapy of Ancef; patient developed atrial flutter which could not be successfully paced and she was started on amiodarone therapy to achieve sinus rhythm which did not work; [facility]all[facility] cardiology felt the Lopressor would be best to control her rate and she was started on anticoagulation-however she is extremely sensitive to warfarin; cardiology has asked [place] to draw PT on Monday 1/13 and send the results to cardiology's office to Dr. Oval Linsey SNF- we will continue warfarin 2 mg daily  and draw PT/INR on 1/13 and send results to Dr. Oval Linsey; continue Lasix 40 mg daily with a potassium supplement; will follow-up BMP in 5 to 7 days  Chronic atrial fib onset after surgery SNF- continue amiodarone 200 mg daily, and metoprolol 12.5 mg twice daily for rate control and warfarin 2 mg daily as prophylaxis-this will be actively titrated  Vitamin D deficiency SNF- continue replacement 5000 units daily   Time spent greater than 60 minutes;> 50% of time with patient was spent reviewing records, labs, tests and studies, counseling and developing plan of care  Webb Silversmith D. Sheppard Coil, MD

## 2018-05-13 ENCOUNTER — Encounter: Payer: Self-pay | Admitting: Internal Medicine

## 2018-05-13 DIAGNOSIS — I4891 Unspecified atrial fibrillation: Secondary | ICD-10-CM | POA: Insufficient documentation

## 2018-05-15 ENCOUNTER — Other Ambulatory Visit: Payer: Self-pay | Admitting: Internal Medicine

## 2018-05-15 LAB — POCT INR
INR: 1.9 — AB (ref 2.0–3.0)
INR: 1.9 — AB (ref 2.0–3.0)

## 2018-05-15 MED ORDER — OXYCODONE-ACETAMINOPHEN 7.5-325 MG PO TABS: 1.0000 | ORAL_TABLET | Freq: Three times a day (TID) | ORAL | 0 refills | Status: AC

## 2018-05-16 ENCOUNTER — Inpatient Hospital Stay: Payer: Medicare Other | Admitting: Internal Medicine

## 2018-05-17 ENCOUNTER — Encounter: Payer: Self-pay | Admitting: Cardiology

## 2018-05-17 ENCOUNTER — Ambulatory Visit: Payer: Self-pay | Admitting: Cardiology

## 2018-05-17 NOTE — Progress Notes (Signed)
This encounter was created in error - please disregard.

## 2018-05-17 NOTE — Progress Notes (Signed)
[facility] nurse faxed handwritten illegible inr result to coumadin on 1/13 with missing phone number I had to google the different [facility] locations and call them till I found the right one. Then RN Lenward Chancellor stated that Dr. Doy Mince from the rehab facility would cover it there without letting the coumadin clnic know of the change.

## 2018-05-21 ENCOUNTER — Other Ambulatory Visit: Payer: Self-pay | Admitting: Family Medicine

## 2018-05-21 DIAGNOSIS — I10 Essential (primary) hypertension: Secondary | ICD-10-CM

## 2018-05-22 ENCOUNTER — Non-Acute Institutional Stay (SKILLED_NURSING_FACILITY): Payer: Medicare Other | Admitting: Internal Medicine

## 2018-05-22 ENCOUNTER — Encounter: Payer: Self-pay | Admitting: Internal Medicine

## 2018-05-22 DIAGNOSIS — M792 Neuralgia and neuritis, unspecified: Secondary | ICD-10-CM

## 2018-05-22 DIAGNOSIS — M7989 Other specified soft tissue disorders: Secondary | ICD-10-CM | POA: Diagnosis not present

## 2018-05-22 NOTE — Progress Notes (Signed)
:   Location:  [place] Room Number: 353G Place of Service:  SNF (31)  Elizabeth Fentress D. Sheppard Coil, Elizabeth [name]  Patient Care Team: Carollee Herter, Alferd Apa, DO as PCP - General Skeet Latch, Elizabeth [name] as PCP - Cardiology (Cardiology) Suella Broad, Elizabeth [name] as Consulting Physician (Physical Medicine and Rehabilitation) Curt Bears, Elizabeth [name] as Consulting Physician (Oncology) Harriett Sine, Elizabeth [name] as Consulting Physician (Dermatology) Delila Pereyra, Elizabeth [name] as Consulting Physician (Gynecology) Kelton Pillar, Junita Push, LCSW as Social Worker (Licensed Clinical Social Worker) Harriett Sine, Elizabeth [name] as Consulting Physician (Dermatology)  Extended Emergency Contact Information Primary Emergency Contact: [place], [place] [address] Johnnette Litter of [place] Phone: [phone] Work Phone: [phone] Mobile Phone: [phone] Relation: Daughter Secondary Emergency Contact: B, [place] Phone: [phone] Relation: Daughter     Allergies: Penicillins  Chief Complaint  Patient presents with  . Acute Visit    Right arm swelling    HPI: Patient is 68 y.o. female who I am seeing for right upper extremity swelling.  Patient has a PICC line in that arm for IV antibiotics for endocarditis.  Patient complains of numbness and tingling from elbow to wrist and swelling in her arm.  Patient says the only reason she is taking oxycodone is for the pain in her arm and she would like to be off of that so she can participate in therapy a little better.  Pain is been ongoing for approximately 3 to 4 days but just told nursing yesterday.  Right upper extremity ultrasound was ordered yesterday per phone.  Past Medical History:  Diagnosis Date  . Aortic stenosis, mild 01/08/2015  . Dyspnea    with rest   . Dysrhythmia   . Hx of cancer of lung 6/10   Removed June 2010  . Hyperlipidemia   . Hypertension   . Low back pain   . Morbid obesity ([place]) 01/08/2015  . Right foot injury     report she sustained a fracture to her right , present with ortho boot on affected extremity   . Vitamin D deficiency     Past Surgical History:  Procedure Laterality Date  . ANKLE FUSION Left   . AORTIC VALVE REPLACEMENT N/A 04/29/2018   Procedure: AORTIC VALVE REPLACEMENT (AVR);  Surgeon: Gaye Pollack, Elizabeth [name];  Location: [facility];  Service: Open Heart Surgery;  Laterality: N/A;  . CHOLECYSTECTOMY    . INCISION AND DRAINAGE HIP Left 04/17/2018   Procedure: IRRIGATION AND DEBRIDEMENT LEFT HIP (ANTERIOR);  Surgeon: Gaynelle Arabian, Elizabeth [name];  Location: [facility];  Service: Orthopedics;  Laterality: Left;  . LUNG REMOVAL, PARTIAL    . MITRAL VALVE REPLACEMENT N/A 04/29/2018   Procedure: MITRAL VALVE (MV) REPLACEMENT;  Surgeon: Gaye Pollack, Elizabeth [name];  Location: [facility];  Service: Open Heart Surgery;  Laterality: N/A;  . RIGHT/LEFT HEART CATH AND CORONARY ANGIOGRAPHY N/A 04/28/2018   Procedure: RIGHT/LEFT HEART CATH AND CORONARY ANGIOGRAPHY;  Surgeon: Jolaine Artist, Elizabeth [name];  Location: [facility];  Service: Cardiovascular;  Laterality: N/A;  . TEE WITHOUT CARDIOVERSION     04/17/2018  . TEE WITHOUT CARDIOVERSION N/A 04/17/2018   Procedure: TRANSESOPHAGEAL ECHOCARDIOGRAM (TEE);  Surgeon: Larey Dresser, Elizabeth [name];  Location: [facility];  Service: Cardiovascular;  Laterality: N/A;  . TOTAL KNEE ARTHROPLASTY Right 12/12/2017   Procedure: RIGHT TOTAL KNEE ARTHROPLASTY;  Surgeon: Gaynelle Arabian, Elizabeth [name];  Location: [facility];  Service: Orthopedics;  Laterality: Right;  . TUBAL LIGATION  Allergies as of 05/22/2018      Reactions   Penicillins Other (See Comments)   Fever -when she was 68 yrs old Has patient had a PCN reaction causing immediate rash, facial/tongue/throat swelling, SOB or lightheadedness with hypotension: NO Has patient had a PCN reaction causing severe rash involving mucus membranes or skin necrosis: Unknown Has patient had a PCN reaction that required hospitalization: Unknown Has patient had  a PCN reaction occurring within the last 10 years: No Childhood allergy If all of the above answers are "NO", then may proceed with Cephalosporin use.      Medication List       Accurate as of May 22, 2018  2:57 PM. Always use your most recent med list.        amiodarone 200 MG tablet Commonly known as:  PACERONE Take 1 tablet (200 mg total) by mouth daily.   aspirin 81 MG EC tablet Take 1 tablet (81 mg total) by mouth daily.   ceFAZolin 2-4 GM/100ML-% IVPB Commonly known as:  ANCEF Inject 100 mLs (2 g total) into the vein every 8 (eight) hours.   DIALYVITE VITAMIN D 5000 125 MCG (5000 UT) capsule Generic drug:  Cholecalciferol Take 5,000 Units by mouth daily.   feeding supplement (ENSURE ENLIVE) Liqd Take 237 mLs by mouth 2 (two) times daily between meals.   furosemide 40 MG tablet Commonly known as:  LASIX Take 1 tablet (40 mg total) by mouth daily.   gabapentin 300 MG capsule Commonly known as:  NEURONTIN Take 300 mg by mouth. take 1 capsule po three times daily for right arm tingling   HAIR SKIN AND NAILS FORMULA Tabs Take 2 tablets by mouth daily.   metoprolol tartrate 25 MG tablet Commonly known as:  LOPRESSOR Take 0.5 tablets (12.5 mg total) by mouth 2 (two) times daily.   oxyCODONE-acetaminophen 7.5-325 MG tablet Commonly known as:  PERCOCET Take 1 tablet by mouth every 8 (eight) hours.   potassium chloride SA 20 MEQ tablet Commonly known as:  K-DUR,KLOR-CON Take 1 tablet (20 mEq total) by mouth daily.   rifampin 300 MG capsule Commonly known as:  RIFADIN Take 1 capsule (300 mg total) by mouth every 12 (twelve) hours.   saccharomyces boulardii 250 MG capsule Commonly known as:  FLORASTOR Take 250 mg by mouth. Give 1 capsule by mouth twice daily to restore normal flora d/t Abt Tx   warfarin 2.5 MG tablet Commonly known as:  COUMADIN Take 2.5 mg by mouth daily. TAKE 1 TAB PO ON MON- WED-FRIDAY   warfarin 2 MG tablet Commonly known as:   COUMADIN Take 1 tablet (2 mg total) by mouth daily at 6 PM. Or as directed       No orders of the defined types were placed in this encounter.   Immunization History  Administered Date(s) Administered  . Influenza Whole 04/06/2007  . Influenza, High Dose Seasonal PF 02/14/2017, 03/06/2018  . Influenza,inj,Quad PF,6+ Mos 01/20/2015, 03/09/2016  . Influenza-Unspecified 03/08/2012, 03/08/2014  . Pneumococcal Conjugate-13 10/17/2014  . Pneumococcal Polysaccharide-23 12/08/2015, 04/10/2018  . Tdap 10/11/2011  . Zoster 10/25/2011    Social History   Tobacco Use  . Smoking status: Former Smoker    Types: Cigarettes    Last attempt to quit: 02/01/2005    Years since quitting: 13.3  . Smokeless tobacco: Never Used  Substance Use Topics  . Alcohol use: No    Alcohol/week: 0.0 standard drinks    Family history is   Family History  Problem Relation Age of Onset  . Hypertension Mother   . Stroke Mother   . Cancer Mother        lung  . Heart disease Father        cabg  . Cancer Brother        skin      Review of Systems  DATA OBTAINED: from patient-as per history present illness GENERAL:  no fevers, fatigue, appetite changes SKIN: No itching, or rash EYES: No eye pain, redness, discharge EARS: No earache, tinnitus, change in hearing NOSE: No congestion, drainage or bleeding  MOUTH/THROAT: No mouth or tooth pain, No sore throat RESPIRATORY: No cough, wheezing, SOB CARDIAC: No chest pain, palpitations, right upper extremity edema  GI: No abdominal pain, No N/V/D or constipation, No heartburn or reflux  GU: No dysuria, frequency or urgency, or incontinence  MUSCULOSKELETAL: No unrelieved bone/joint pain NEUROLOGIC: No headache, dizziness or focal weakness; numbness tingling from elbow to wrist PSYCHIATRIC: No c/o anxiety or sadness   Vitals:   05/22/18 1446  BP: 134/73  Pulse: 88  Resp: 18  Temp: 97.8 F (36.6 C)  SpO2: 95%    SpO2 Readings from Last 1  Encounters:  05/22/18 95%   Body mass index is 40.82 kg/m.     Physical Exam  GENERAL APPEARANCE: Alert, conversant,  No acute distress.  SKIN: No diaphoresis rash HEAD: Normocephalic, atraumatic  EYES: Conjunctiva/lids clear. Pupils round, reactive. EOMs intact.  EARS: External exam WNL, canals clear. Hearing grossly normal.  NOSE: No deformity or discharge.  MOUTH/THROAT: Lips w/o lesions  RESPIRATORY: Breathing is even, unlabored. Lung sounds are clear   CARDIOVASCULAR: Heart RRR no murmurs, rubs or gallops; swelling right arm down to and including part of elbow, forearm does not seem to be swollen; good radial pulse GASTROINTESTINAL: Abdomen is soft, non-tender, not distended w/ normal bowel sounds. GENITOURINARY: Bladder non tender, not distended  MUSCULOSKELETAL: No abnormal joints or musculature NEUROLOGIC:  Cranial nerves 2-12 grossly intact. Moves all extremities  PSYCHIATRIC: Mood and affect appropriate to situation, no behavioral issues  Patient Active Problem List   Diagnosis Date Noted  . Benign essential HTN   . History of lung cancer   . Tachypnea   . Leukocytosis   . Acute blood loss anemia   . Staphylococcal sepsis ([place])   . S/P AVR (aortic valve replacement) 04/29/2018  . S/P MVR (mitral valve repair) 04/29/2018  . Abdominal pain   . Typical atrial flutter ([place])   . Acute pulmonary edema (H[facility])   . Acute respiratory failure ([place])   . Cardiac arrest, cause unspecified ([place])   . Cardiogenic shock ([place])   . Atrioventricular block, complete ([place])   . Severe aortic regurgitation   . SVT (supraventricular tachycardia) ([place])   . 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 Infective endocarditis   . Thrombocytopenia ([place])   . Anemia of chronic disease   . Chest tightness   . Fever   . Pressure injury of skin 04/10/2018  . Septic arthritis ([place]) 04/09/2018  . Acute kidney injury (AKI) with acute tubular necrosis (ATN) (HCC)   . Arterial hypotension   . Sepsis  with acute organ dysfunction and septic shock (HCC)   . OA (osteoarthritis) of knee 12/12/2017  . Idiopathic chronic venous hypertension of both lower extremities with inflammation 06/29/2016  . Subtalar joint instability, left 06/29/2016  . Arthritis of right knee 01/30/2016  . Lower extremity edema 10/21/2015  . Leg cramps 08/21/2015  . Left  ankle pain 04/09/2015  . Anxiety 01/20/2015  . Nonrheumatic aortic insufficiency with aortic stenosis 01/08/2015  . Obesity, Class III, BMI 40-49.9 (morbid obesity) ([place]) 01/08/2015  . Varicose veins 01/02/2015  . Snoring 12/06/2014  . Viral gastroenteritis 04/06/2013  . Insect bite of ankle, right, infected 07/31/2012  . Primary cancer of right upper lobe of lung ([place]) 10/18/2011  . Left hip pain 06/29/2011  . Pain in joint, lower leg 03/17/2009  . GASTROENTERITIS 07/11/2008  . PULMONARY NODULE, SOLITARY 04/18/2008  . LIVER FUNCTION TESTS, ABNORMAL, HX OF 04/01/2008  . B12 DEFICIENCY 11/13/2007  . COUGH 11/13/2007  . Vitamin D deficiency 11/07/2007  . TINEA PEDIS 07/07/2007  . LYMPHADENOPATHY 11/16/2006  . Hyperlipidemia 09/15/2006  . Essential hypertension 09/15/2006  . LOW BACK PAIN 09/15/2006      Labs reviewed: Basic Metabolic Panel:    Component Value Date/Time   NA 137 05/10/2018 0520   NA 143 01/05/2017 1131   K 4.0 05/10/2018 0520   K 4.4 01/05/2017 1131   CL 98 05/10/2018 0520   CL 104 10/02/2012 0949   CO2 30 05/10/2018 0520   CO2 31 (H) 01/05/2017 1131   GLUCOSE 84 05/10/2018 0520   GLUCOSE 99 01/05/2017 1131   GLUCOSE 104 (H) 10/02/2012 0949   BUN 18 05/10/2018 0520   BUN 10.2 01/05/2017 1131   CREATININE 0.84 05/10/2018 0520   CREATININE 0.83 11/11/2017 1636   CREATININE 0.9 01/05/2017 1131   CALCIUM 8.3 (L) 05/10/2018 0520   CALCIUM 10.2 01/05/2017 1131   PROT 5.6 (L) 05/05/2018 0423   PROT 7.6 01/05/2017 1131   ALBUMIN 2.3 (L) 05/05/2018 0423   ALBUMIN 3.7 01/05/2017 1131   AST 24 05/05/2018 0423   AST  20 01/05/2017 1131   ALT 8 05/05/2018 0423   ALT 13 01/05/2017 1131   ALKPHOS 58 05/05/2018 0423   ALKPHOS 80 01/05/2017 1131   BILITOT 2.1 (H) 05/05/2018 0423   BILITOT 0.67 01/05/2017 1131   GFRNONAA >60 05/10/2018 0520   GFRAA >60 05/10/2018 0520    Recent Labs    04/25/18 0411 04/25/18 1625  04/27/18 0516  04/29/18 2239 04/30/18 0334 04/30/18 1712  05/07/18 0406 05/08/18 0308 05/10/18 0520  NA 140  --    < > 138   < >  --  137  --    < > 134* 136 137  K 3.2*  --    < > 4.2   < >  --  3.8  --    < > 3.9 4.3 4.0  CL 95*  --    < > 95*   < >  --  102  --    < > 91* 95* 98  CO2 36*  --    < > 34*   < >  --  25  --    < > 33* 37* 30  GLUCOSE 106*  --    < > 109*   < >  --  112*  --    < > 142* 105* 84  BUN 27*  --    < > 29*   < >  --  27*  --    < > 18 24* 18  CREATININE 0.80  --    < > 1.00   < > 0.89 1.01* 0.87   < > 0.98 0.90 0.84  CALCIUM 8.0*  --    < > 8.0*   < >  --  7.3*  --    < >  8.2* 8.1* 8.3*  MG 1.8 2.3  --  2.3  --  2.9* 2.8* 2.2  --   --   --   --   PHOS 4.0 3.5  --  3.7  --   --   --   --   --   --   --   --    < > = values in this interval not displayed.   Liver Function Tests: Recent Labs    04/26/18 0430 05/02/18 0647 05/05/18 0423  AST 16 153* 24  ALT 8 55* 8  ALKPHOS 41 94 58  BILITOT 1.3* 2.6* 2.1*  PROT 5.8* 5.8* 5.6*  ALBUMIN 2.5* 2.8* 2.3*   No results for input(s): LIPASE, AMYLASE in the last 8760 hours. No results for input(s): AMMONIA in the last 8760 hours. CBC: Recent Labs    04/16/18 0403  04/19/18 0500  05/01/18 0437  05/03/18 0432 05/06/18 0609 05/07/18 0406  WBC 17.8*   < > 13.2*   < > 13.4*   < > 12.8* 10.1 10.9*  NEUTROABS 14.6*  --  10.5*  --  10.7*  --   --   --   --   HGB 9.3*   < > 8.1*   < > 9.6*   < > 10.5* 10.0* 9.5*  HCT 30.0*   < > 27.0*   < > 31.2*   < > 32.9* 33.3* 31.7*  MCV 97.7   < > 100.7*   < > 93.7   < > 91.4 94.6 95.5  PLT 64*   < > 71*   < > 63*   < > 113* 146* 178   < > = values in this interval  not displayed.   Lipid Recent Labs    11/11/17 1636  CHOL 141  HDL 35*  LDLCALC 85  TRIG 114    Cardiac Enzymes: Recent Labs    04/16/18 2320 04/17/18 0503 04/17/18 1105  TROPONINI 0.05* 0.05* 0.06*   BNP: No results for input(s): BNP in the last 8760 hours. No results found for: [facility] Lab Results  Component Value Date   HGBA1C 5.5 11/23/2017   Lab Results  Component Value Date   TSH 1.031 04/12/2018   Lab Results  Component Value Date   VITAMINB12 507 04/20/2018   Lab Results  Component Value Date   FOLATE 10.8 04/20/2018   Lab Results  Component Value Date   IRON 16 (L) 04/20/2018   TIBC 273 04/20/2018   FERRITIN 128 04/20/2018    Imaging and Procedures obtained prior to SNF admission: Dg Chest 2 View  Result Date: 04/09/2018 CLINICAL DATA:  Fever. Status post right knee replacement on November 11th. EXAM: CHEST - 2 VIEW COMPARISON:  CT chest dated 01/20/2018 FINDINGS: Trace right pleural effusion, chronic. Left lung is clear. No pneumothorax. Surgical clips in the right perihilar region. The heart is normal in size. Visualized osseous structures are within normal limits. IMPRESSION: Postsurgical changes right hemithorax with chronic trace right pleural effusion. No evidence of acute cardiopulmonary disease. Electronically Signed   By: Julian Hy M.D.   On: 04/09/2018 17:02   Dg Arthro Hip Left  Result Date: 04/11/2018 INDICATION: Severe left hip pain.  Clinical concern for septic arthritis. EXAM: ARTHROCENTESIS OF LEFT HIP JOINT UNDER FLUOROSCOPIC GUIDANCE COMPARISON:  Radiographs 04/09/2018 and MRI 04/04/2018 FLUOROSCOPY TIME:  Fluoroscopy Time:  54 seconds Radiation Exposure Index (if provided by the fluoroscopic device): 17.5 mGy Number of Acquired Spot Images: 0 COMPLICATIONS:  None immediate. PROCEDURE: Informed written consent was obtained from the patient after discussion of the risks, benefits and alternatives to treatment. The patient was  placed prone on the fluoroscopy table and the left extremity was placed in a slight degree of flexion and internal rotation. The left hip was localized with fluoroscopy. The skin overlying the anterior aspect of the hip was prepped and draped in usual sterile fashion. A 22 gauge spinal needle was advanced into the hip joint at the lateral aspect of the femoral head-neck junction, after the overlying soft tissues were anesthetized with 1% lidocaine. No fluid could be aspirated. Subsequently, 2 cc Isovue-300 was injected into the joint to document intra-articular location. A fluoroscopic image was saved and sent to PACs. 2 cc of fluid was then aspirated from the left hip joint. The aspirated fluid was sent for laboratory evaluation ordered by referring physician. The needle was removed and a dressing was placed. The patient tolerated procedure well without immediate postprocedural complication. IMPRESSION: Successful fluoroscopic guided aspiration of the left hip. Electronically Signed   By: Marijo Sanes M.D.   On: 04/11/2018 07:55   Dg Chest Port 1 View  Result Date: 04/10/2018 CLINICAL DATA:  Chest tightness EXAM: PORTABLE CHEST 1 VIEW COMPARISON:  04/09/2018 FINDINGS: Cardiac shadow is stable. Postsurgical changes are noted on the right. The lungs are clear. Persistent blunting of the right costophrenic angle is noted. No pneumothorax is seen. IMPRESSION: Stable blunting of the right costophrenic angle. Postoperative changes without pneumothorax. Electronically Signed   By: Inez Catalina M.D.   On: 04/10/2018 16:32   Dg Hip Unilat W Or Wo Pelvis 2-3 Views Left  Result Date: 04/09/2018 CLINICAL DATA:  Left hip pain. EXAM: DG HIP (WITH OR WITHOUT PELVIS) 2-3V LEFT COMPARISON:  Radiographs 1July 08, 202019 FINDINGS: Bilateral hip joint degenerative changes severe on the left and moderately severe on the right. There is joint space narrowing and osteophytic spurring. No definite fracture or AVN. The pubic symphysis  and SI joints are intact. No pelvic fractures or bone lesions. IMPRESSION: Severe left and moderately severe right hip joint degenerative changes but no definite fracture or AVN. Electronically Signed   By: Marijo Sanes M.D.   On: 04/09/2018 17:00     Not all labs, radiology exams or other studies done during hospitalization come through on my EPIC note; however they are reviewed by me.    Assessment and Plan  Right arm swelling/presence of PICC line/neuropathic pain- ultrasound that was done yesterday returned with no DVT, patient is already on Coumadin secondary to her multivalve heart surgery; patient symptoms sound like irritation to the ulnar nerve and since patient says that the oxycodone does not really help the pain I discussed Neurontin with her; will start Neurontin 300 mg 3 times daily; also need to elevate her arm on pillows while she is in bed and orders have been written for both; we did discuss removal of PICC line and placement elsewhere but ultimately decided to leave line in place for the moment; will follow response to Neurontin

## 2018-05-23 ENCOUNTER — Encounter: Payer: Self-pay | Admitting: Internal Medicine

## 2018-05-25 ENCOUNTER — Encounter: Payer: Self-pay | Admitting: Medical

## 2018-05-25 ENCOUNTER — Ambulatory Visit (INDEPENDENT_AMBULATORY_CARE_PROVIDER_SITE_OTHER): Payer: Medicare Other | Admitting: Medical

## 2018-05-25 VITALS — BP 101/64 | HR 131 | Ht 64.0 in | Wt 236.0 lb

## 2018-05-25 DIAGNOSIS — I10 Essential (primary) hypertension: Secondary | ICD-10-CM | POA: Diagnosis not present

## 2018-05-25 DIAGNOSIS — Z7901 Long term (current) use of anticoagulants: Secondary | ICD-10-CM | POA: Diagnosis not present

## 2018-05-25 DIAGNOSIS — I4892 Unspecified atrial flutter: Secondary | ICD-10-CM | POA: Diagnosis not present

## 2018-05-25 DIAGNOSIS — R7881 Bacteremia: Secondary | ICD-10-CM | POA: Diagnosis not present

## 2018-05-25 DIAGNOSIS — I2510 Atherosclerotic heart disease of native coronary artery without angina pectoris: Secondary | ICD-10-CM | POA: Diagnosis not present

## 2018-05-25 DIAGNOSIS — Z952 Presence of prosthetic heart valve: Secondary | ICD-10-CM | POA: Diagnosis not present

## 2018-05-25 MED ORDER — POTASSIUM CHLORIDE CRYS ER 20 MEQ PO TBCR: 20.0000 meq | EXTENDED_RELEASE_TABLET | Freq: Two times a day (BID) | ORAL | 1 refills | Status: AC

## 2018-05-25 MED ORDER — FUROSEMIDE 40 MG PO TABS: 40.0000 mg | ORAL_TABLET | Freq: Two times a day (BID) | ORAL | 1 refills | Status: AC

## 2018-05-25 MED ORDER — METOPROLOL TARTRATE 25 MG PO TABS: 25.0000 mg | ORAL_TABLET | Freq: Two times a day (BID) | ORAL | 1 refills | Status: AC

## 2018-05-25 NOTE — Progress Notes (Signed)
Cardiology Office Note   Date:  05/25/2018   ID:  Elizabeth [name] 1950-11-11, MRN [id number]  PCP:  Carollee Herter, Alferd Apa, DO  Cardiologist:  Skeet Latch, MD EP: None  Chief Complaint  Patient presents with  . Hospitalization Follow-up    s/p AVR/MVR, atrial flutter      History of Present Illness: Elizabeth [name] is a 68 y.o. female with PMH of HTN, HLD, and history of lung cancer s/p lobectomy, total knee replacement, with recent extensive hospital stay for bacteremia and endocarditis after receiving a left hip steroid injection, who ultimately underwent aortic valve replacement and mitral valve repair 04/29/18, with hospital course complicated by atrial flutter/SVT, who presents for post-hospital follow-up.  She was admitted to the hospital from 04/09/2018-05/11/2018 after presenting with sepsis. 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 Her hospital course was complicated by atrial flutter and SVT, for which she was started on amiodarone. Right/Left heart cath revealed mild-moderate non-obstructive CAD with 50% pRCA stenosis, and severe AI She underwent aortic valve replacement and mitral valve repair 04/29/2018 for management of endocarditis and valvulopathy. She was last seen by cardiology, Dr. Stanford Breed, on 05/08/2018 during hospital admission and recommended to continue amiodarone and metoprolol, and consider DCCV if persistent arrhythmias. She was discharged to a rehab facility where she continues to reside.   She returns today for post-hospital follow-up. She reports doing well since leaving the hospital. She has slowly been regaining her strength. She reports surgical site is healing well with some expected chest discomfort with certain movements/activities. No complaints of chest pressure/tightness. She denies SOB, DOE, orthopnea, PND, dizziness, lightheadedness, syncope, hematuria, melena, hematochezia, palpitations, or racing  heart beat sensation. Her INR is being monitored at her rehab facility currently and she does not recall  her last level.     Past Medical History:  Diagnosis Date  . Aortic stenosis, mild 01/08/2015  . Dyspnea    with rest   . Dysrhythmia   . Hx of cancer of lung 6/10   Removed June 2010  . Hyperlipidemia   . Hypertension   . Low back pain   . Morbid obesity ([place]) 01/08/2015  . Right foot injury    report she sustained a fracture to her right , present with ortho boot on affected extremity   . Vitamin D deficiency     Past Surgical History:  Procedure Laterality Date  . ANKLE FUSION Left   . AORTIC VALVE REPLACEMENT N/A 04/29/2018   Procedure: AORTIC VALVE REPLACEMENT (AVR);  Surgeon: Gaye Pollack, MD;  Location: [facility];  Service: Open Heart Surgery;  Laterality: N/A;  . CHOLECYSTECTOMY    . INCISION AND DRAINAGE HIP Left 04/17/2018   Procedure: IRRIGATION AND DEBRIDEMENT LEFT HIP (ANTERIOR);  Surgeon: Gaynelle Arabian, MD;  Location: [facility];  Service: Orthopedics;  Laterality: Left;  . LUNG REMOVAL, PARTIAL    . MITRAL VALVE REPLACEMENT N/A 04/29/2018   Procedure: MITRAL VALVE (MV) REPLACEMENT;  Surgeon: Gaye Pollack, MD;  Location: [facility];  Service: Open Heart Surgery;  Laterality: N/A;  . RIGHT/LEFT HEART CATH AND CORONARY ANGIOGRAPHY N/A 04/28/2018   Procedure: RIGHT/LEFT HEART CATH AND CORONARY ANGIOGRAPHY;  Surgeon: Jolaine Artist, MD;  Location: [facility];  Service: Cardiovascular;  Laterality: N/A;  . TEE WITHOUT CARDIOVERSION     04/17/2018  . TEE WITHOUT CARDIOVERSION N/A 04/17/2018   Procedure: TRANSESOPHAGEAL ECHOCARDIOGRAM (TEE);  Surgeon: Larey Dresser, MD;  Location: [facility];  Service: Cardiovascular;  Laterality: N/A;  . TOTAL KNEE ARTHROPLASTY Right 12/12/2017   Procedure: RIGHT TOTAL KNEE ARTHROPLASTY;  Surgeon: Gaynelle Arabian, MD;  Location: [facility];  Service: Orthopedics;  Laterality: Right;  . TUBAL LIGATION       Current Outpatient  Medications  Medication Sig Dispense Refill  . amiodarone (PACERONE) 200 MG tablet Take 1 tablet (200 mg total) by mouth daily.    Marland Kitchen aspirin EC 81 MG EC tablet Take 1 tablet (81 mg total) by mouth daily.    Marland Kitchen ceFAZolin (ANCEF) 2-4 GM/100ML-% IVPB Inject 100 mLs (2 g total) into the vein every 8 (eight) hours. 8700 mL 0  . Cholecalciferol (DIALYVITE VITAMIN D 5000) 125 MCG (5000 UT) capsule Take 5,000 Units by mouth daily.    . feeding supplement, ENSURE ENLIVE, (ENSURE ENLIVE) LIQD Take 237 mLs by mouth 2 (two) times daily between meals. 237 mL 12  . furosemide (LASIX) 40 MG tablet Take 1 tablet (40 mg total) by mouth 2 (two) times daily. 60 tablet 1  . gabapentin (NEURONTIN) 300 MG capsule Take 300 mg by mouth. take 1 capsule po three times daily for right arm tingling    . metoprolol tartrate (LOPRESSOR) 25 MG tablet Take 1 tablet (25 mg total) by mouth 2 (two) times daily. 60 tablet 1  . Multiple Vitamins-Minerals (HAIR SKIN AND NAILS FORMULA) TABS Take 2 tablets by mouth daily.    Marland Kitchen oxyCODONE-acetaminophen (PERCOCET) 7.5-325 MG tablet Take 1 tablet by mouth every 8 (eight) hours. 60 tablet 0  . potassium chloride SA (K-DUR,KLOR-CON) 20 MEQ tablet Take 1 tablet (20 mEq total) by mouth 2 (two) times daily. 60 tablet 1  . rifampin (RIFADIN) 300 MG capsule Take 1 capsule (300 mg total) by mouth every 12 (twelve) hours. 58 capsule 0  . saccharomyces boulardii (FLORASTOR) 250 MG capsule Take 250 mg by mouth. Give 1 capsule by mouth twice daily to restore normal flora d/t Abt Tx    . warfarin (COUMADIN) 2 MG tablet Take 1 tablet (2 mg total) by mouth daily at 6 PM. Or as directed (Patient taking differently: Take 2 mg by mouth daily at 6 PM. TAKE 1 TAB PO ON TUES, THRUS, SAT,SUN FOR MITRAL VALVE REPAIR) 30 tablet 1  . warfarin (COUMADIN) 2.5 MG tablet Take 2.5 mg by mouth daily. TAKE 1 TAB PO ON MON- WED-FRIDAY     No current facility-administered medications for this visit.     Allergies:    Penicillins    Social History:  The patient  reports that she quit smoking about 13 years ago. Her smoking use included cigarettes. She has never used smokeless tobacco. She reports that she does not drink alcohol or use drugs.   Family History:  The patient's family history includes Cancer in her brother and mother; Heart disease in her father; Hypertension in her mother; Stroke in her mother.    ROS:  Please see the history of present illness.   Otherwise, review of systems are positive for none.   All other systems are reviewed and negative.    PHYSICAL EXAM: VS:  BP 101/64   Pulse (!) 131   Ht 5\' 4"  (1.626 m)   Wt 236 lb (107 kg)   BMI 40.51 kg/m  , BMI Body mass index is 40.51 kg/m. GEN: Obese female sitting in wheelchair in no acute distress HEENT: sclera anicteric  Neck: no JVD, carotid bruits, or masses Cardiac: IRIR; no murmurs, rubs, or  gallops, 1+ LE edema  Respiratory:  Mild crackles at lung bases, otherwise clear to auscultation bilaterally, normal work of breathing GI: soft, nontender, nondistended, + BS MS: no deformity or atrophy Skin: warm and dry, no rash Neuro:  Strength and sensation are intact Psych: euthymic mood, full affect   EKG:  EKG is ordered today. The ekg ordered today demonstrates atrial flutter with rate 131 without STE/D.    Recent Labs: 04/12/2018: TSH 1.031 04/30/2018: Magnesium 2.2 05/05/2018: ALT 8 05/07/2018: Hemoglobin 9.5; Platelets 178 05/10/2018: BUN 18; Creatinine, Ser 0.84; Potassium 4.0; Sodium 137    Lipid Panel    Component Value Date/Time   CHOL 141 11/11/2017 1636   CHOL 164 01/05/2017 1131   TRIG 114 11/11/2017 1636   TRIG 73 04/14/2006 1429   HDL 35 (L) 11/11/2017 1636   HDL 44 01/05/2017 1131   CHOLHDL 4.0 11/11/2017 1636   VLDL 28.2 12/08/2015 1213   LDLCALC 85 11/11/2017 1636      Wt Readings from Last 3 Encounters:  05/25/18 236 lb (107 kg)  05/22/18 237 lb 12.8 oz (107.9 kg)  05/12/18 243 lb (110.2 kg)        Other studies Reviewed: Additional studies/ records that were reviewed today include:   Right/Left heart catheterization 04/28/2018:  Ost RCA to Prox RCA lesion is 50% stenosed.  Mid RCA lesion is 20% stenosed.   Findings:  Ao = 95/40 (63)  LV = 123/26 RA =  12 RV = 62/16 PA = 61/28 (38) PCW = 22 Fick cardiac output/index = 5.33/2.5 PVR = 3.0 WU Ao sat = 94% PA sat = 43%, 47%  Assessment: 1. 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 Elevated biventricular pressures 3. Cardiac output is normal however PA sat is quite low at 45% 4. 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 Patient developed atrial flutter in the 50s at the end of the case.  Plan/Discussion:  Surgical eval for AVR. Start heparin for AFL.   Echocardiogram 04/24/18: Study Conclusions  - Left ventricle: Wall thickness was increased in a pattern of mild   LVH. Left ventricular diastolic function parameters were normal. - Aortic valve: Nodular calcification of the leaflet tips with   severe AS. There was severe stenosis. There was mild   regurgitation. Valve area (VTI): 0.73 cm^2. Valve area (Vmax):   0.69 cm^2. Valve area (Vmean): 0.74 cm^2. - Mitral valve: There was moderate regurgitation. - Left atrium: The atrium was moderately dilated. - Right atrium: The atrium was mildly dilated. - Tricuspid valve: There was moderate regurgitation. - Pulmonary arteries: PA peak pressure: 56 mm Hg (S).  ASSESSMENT AND PLAN:   1. Atrial flutter with RVR: EKG with atrial flutter with rate 130.  - Take an extra metoprolol 25mg  upon return to rehab, then start metoprolol 25mg  BID - Will increase lasix to 40mg  BID through Sunday 24-Jun-2018 given 1-2+ LE edema and mild crackles on exam today. Take potassium BID while on BID lasix.  - Continue amiodarone 200mg  daily.  - Will have rehab facility fax INR results to our office - patient will need to be therapeutic on coumadin  prior to consideration for TEE/DCCV.   2. Non-obstructive CAD: noted to have mild-moderate CAD with 50% pRCA and 20% mRCA stenosis. No anginal complaints - Continue aspirin and metoprolol  - Will discuss initating a statin at next visit  3. S/p aortic valve replacement and mitral valve repair 04/29/2018: Incision site healing well.  No complaints of bleeding. Last INR in Epic 05/15/2018 was 1.9 - goal 2.5-3.0 - Will have the rehab facility fax most recent INR levels to our office - Continue coumadin  4. HTN: BP well controlled/on the soft side.  - Continue metoprolol  5. Bacteremia/endocarditis: continues on IV cefazolin and rifampin to complete a 6 weeks course. - Continue management per ID - follow-up scheduled 06/08/2018.   Case discussed with Dr. Oval Linsey.    Current medicines are reviewed at length with the patient today.  The patient does not have concerns regarding medicines.  The following changes have been made:  As above, will increase metoprolol to 25mg  BID and increase lasix to BID x3 days.   Labs/ tests ordered today include: None - will check a BMET at next visit for close monitoring of kidney function. Rehab facility to fax INR results today.  No orders of the defined types were placed in this encounter.    Disposition:   FU with Me/Luke Rosalyn Gess in 1 week  Signed, Abigail Butts, PA-C  05/25/2018 5:12 PM

## 2018-05-25 NOTE — Patient Instructions (Addendum)
Medication Instructions:  INCREASE Metoprolol to 25mg  twice daily TAKE AN ADDITIONAL 25MG  OF METOPROLOL TODAY INCREASE Lasix to 40mg  twice daily UNTIL Sunday (June 05, 2018) INCREASE Potassium to twice daily UNTIL Sunday (06-05-18) If you need a refill on your cardiac medications before your next appointment, please call your pharmacy.   Lab work: None  If you have labs (blood work) drawn today and your tests are completely normal, you will receive your results only by: Marland Kitchen MyChart Message (if you have MyChart) OR . A paper copy in the mail If you have any lab test that is abnormal or we need to change your treatment, we will call you to review the results.  Testing/Procedures: None   Follow-Up: At [facility], you and your health needs are our priority.  As part of our continuing mission to provide you with exceptional heart care, we have created designated Provider Care Teams.  These Care Teams include your primary Cardiologist (physician) and Advanced Practice Providers (APPs -  Physician Assistants and Nurse Practitioners) who all work together to provide you with the care you need, when you need it. . Your physician recommends that you schedule a follow-up appointment in: Monday with [facility] ONLY  Any Other Special Instructions Will Be Listed Below (If Applicable).

## 2018-05-29 ENCOUNTER — Ambulatory Visit: Payer: Self-pay | Admitting: Cardiology

## 2018-05-29 NOTE — Addendum Note (Signed)
Addended by: Vennie Homans on: 05/29/2018 04:35 PM   Modules accepted: Orders

## 2018-05-30 ENCOUNTER — Telehealth: Payer: Self-pay

## 2018-05-30 NOTE — Telephone Encounter (Signed)
Spoke with pt daughter Ebony Hail---- pt became sob and unresponsive while in rehab from aortic valve replacement  She became septic after receiving a joint injection after knee surgery

## 2018-05-30 NOTE — Telephone Encounter (Signed)
Copied from [place] [id number]. Topic: General - Deceased Patient >> 2018-06-27 12:13 PM Rutherford Nail, NT wrote: Reason for CRM: Patient's daughter, Ebony Hail, calling and states that the patient has passed away. Wanted to let Dr Etter Sjogren know that the funeral arrangements are for [facility] in [place] this Thursday (06/01/2018) at 2pm  Route to department's PEC Pool.

## 2018-05-31 LAB — FUNGUS CULTURE WITH STAIN

## 2018-05-31 LAB — FUNGAL ORGANISM REFLEX

## 2018-05-31 LAB — FUNGUS CULTURE RESULT

## 2018-06-03 DEATH — deceased

## 2018-06-07 ENCOUNTER — Ambulatory Visit: Payer: Self-pay | Admitting: Surgery

## 2018-06-08 ENCOUNTER — Inpatient Hospital Stay: Payer: Medicare Other | Admitting: Internal Medicine

## 2018-06-13 LAB — ACID FAST CULTURE WITH REFLEXED SENSITIVITIES (MYCOBACTERIA): Acid Fast Culture: NEGATIVE

## 2018-06-13 LAB — ACID FAST CULTURE WITH REFLEXED SENSITIVITIES

## 2018-07-02 DEATH — deceased

## 2018-07-28 ENCOUNTER — Other Ambulatory Visit: Payer: Self-pay

## 2018-08-04 ENCOUNTER — Encounter: Payer: Self-pay | Admitting: Adult Health

## 2018-08-16 LAB — BLOOD GAS, ARTERIAL
Acid-Base Excess: 10.6 mmol/L — ABNORMAL HIGH (ref 0.0–2.0)
Bicarbonate: 35.5 mmol/L — ABNORMAL HIGH (ref 20.0–28.0)
Drawn by: 295031
FIO2: 100
LHR: 14 {breaths}/min
PEEP: 5 cmH2O
Patient temperature: 98.6
VT: 440 mL
pCO2 arterial: 49.5 mmHg — ABNORMAL HIGH (ref 32.0–48.0)
pH, Arterial: 7.469 — ABNORMAL HIGH (ref 7.350–7.450)
pO2, Arterial: 411 mmHg — ABNORMAL HIGH (ref 83.0–108.0)

## 2018-10-12 ENCOUNTER — Other Ambulatory Visit: Payer: Self-pay

## 2019-06-27 IMAGING — DX DG CHEST 1V PORT
1 series · 1 of 1 positions shown · non-contrast
Comparison: 04/16/2018

CLINICAL DATA: ETT placement

EXAM:
PORTABLE CHEST 1 VIEW

[chest ap]
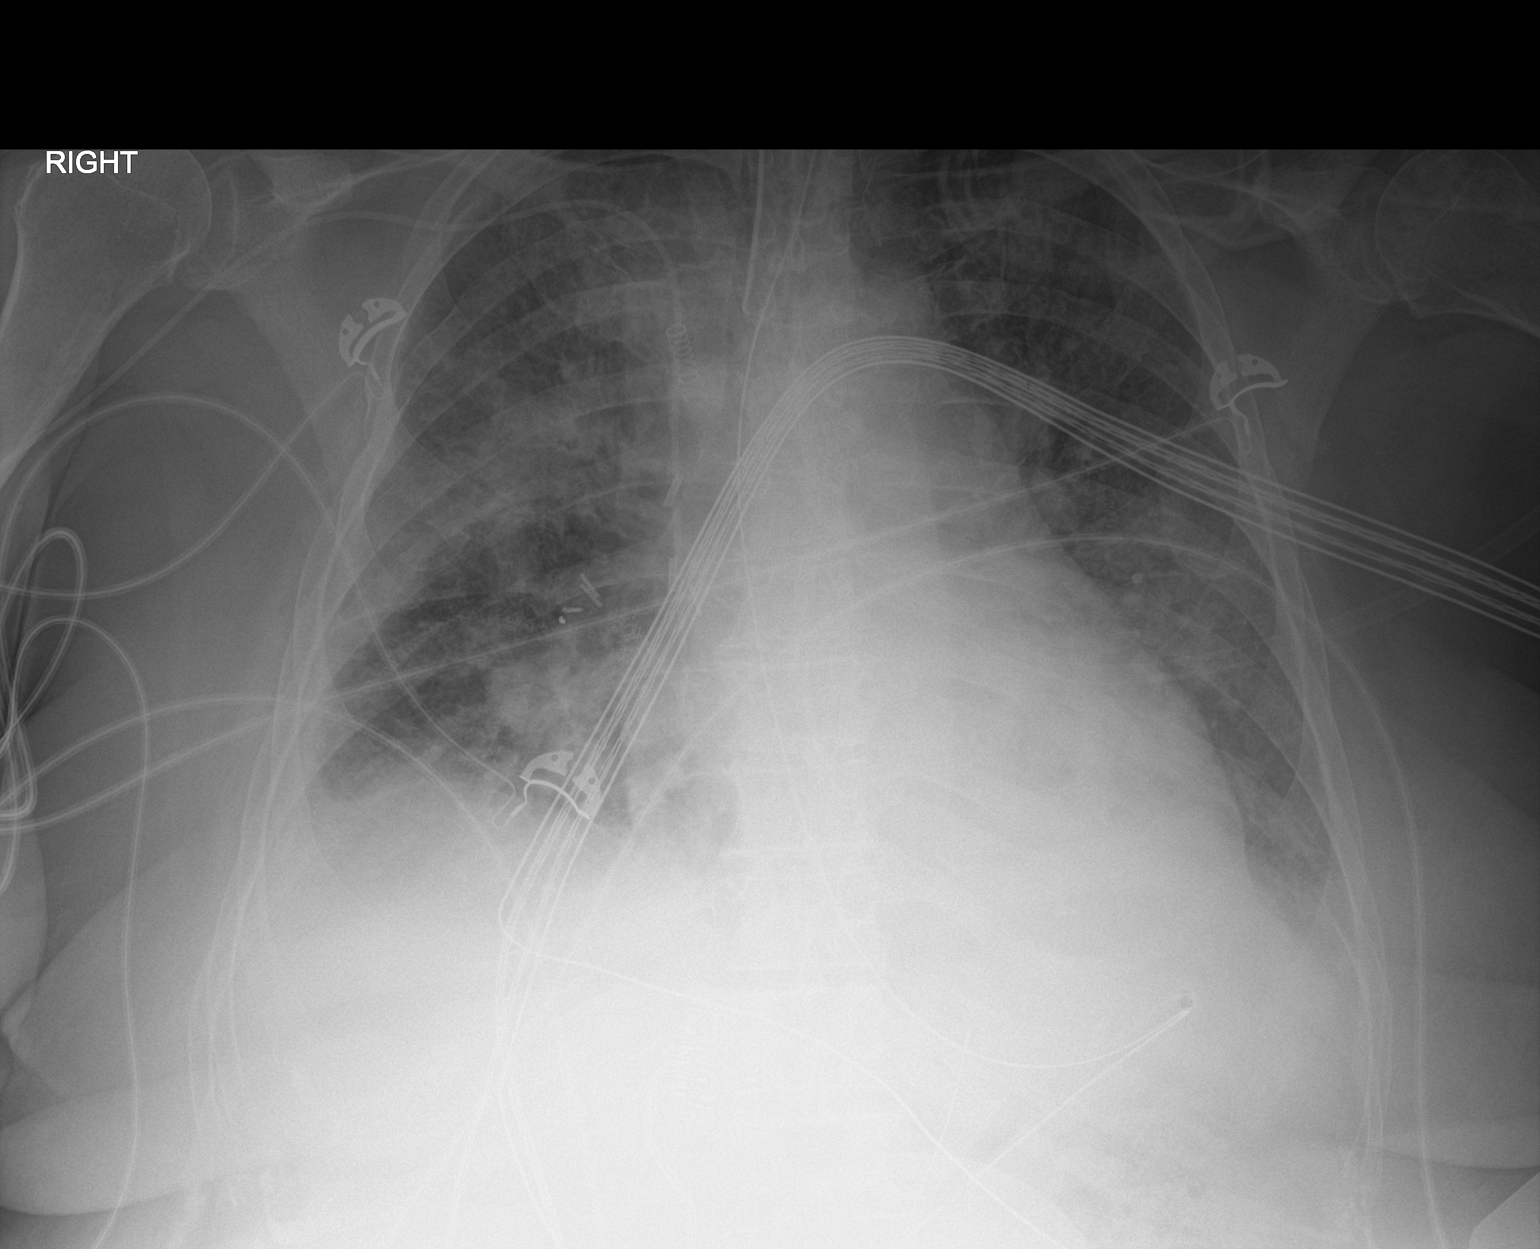

[1 of 1 positions shown; findings below may reference images not displayed]

FINDINGS: Endotracheal tube with the tip 4 cm above the carina. Nasogastric
tube projecting over the stomach. Right-sided PICC line with the tip
projecting over the cavoatrial junction.

Bilateral interstitial and alveolar airspace opacities with patchy
airspace disease in the right upper lobe and right lower lobe.
Bilateral small pleural effusions. No pneumothorax. Stable
cardiomediastinal silhouette.
IMPRESSION: Bilateral interstitial and alveolar airspace opacities which may
reflect pulmonary edema versus multilobar pneumonia.

Support lines and tubing in satisfactory position.

## 2019-07-01 IMAGING — DX DG CHEST 1V PORT
1 series · 1 of 1 positions shown · non-contrast
Comparison: Portable chest x-ray of 04/26/2017

CLINICAL DATA: History of pulmonary edema, follow-up

EXAM:
PORTABLE CHEST 1 VIEW

[chest]
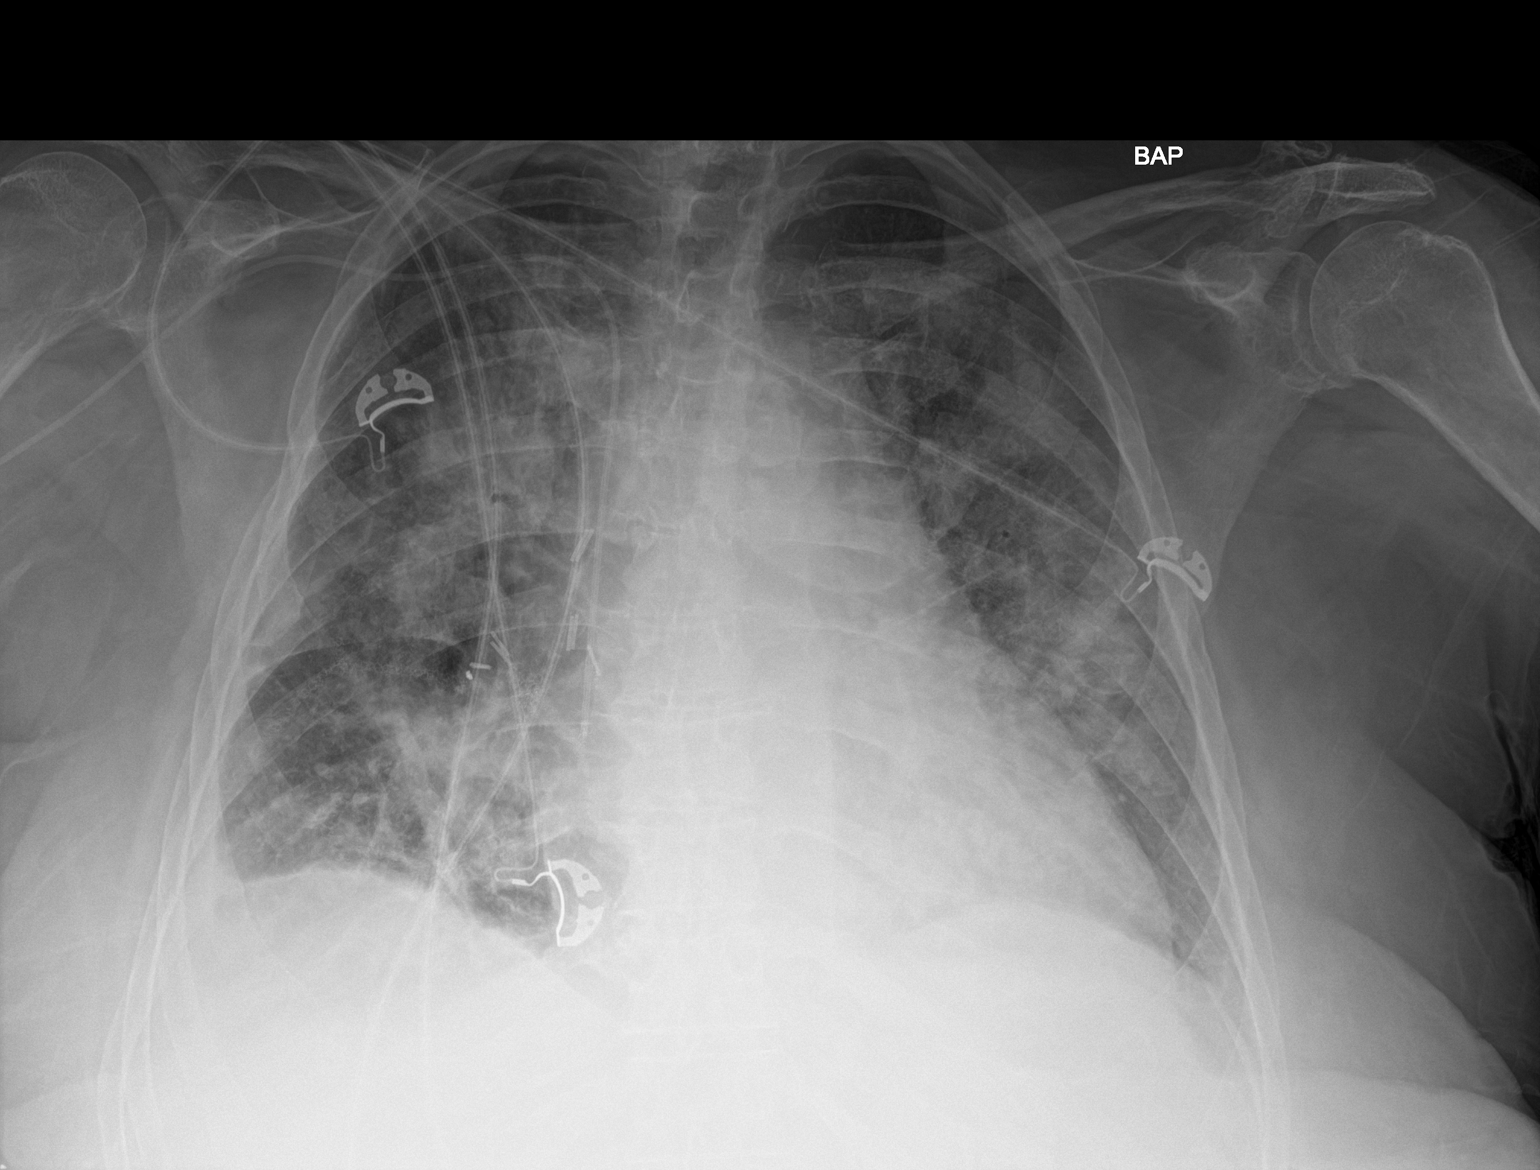

[1 of 1 positions shown; findings below may reference images not displayed]

FINDINGS: The endotracheal tube has been removed. Aeration has improved
somewhat. However there are patchy areas of airspace disease
bilaterally most consistent with either multifocal pneumonia or
residual edema. There may be a small right effusion present. Mild
cardiomegaly is stable. No bony abnormality is seen.
IMPRESSION: 1. Somewhat better aeration, but there are still patchy areas of
airspace disease bilaterally. These may represent multifocal
pneumonia or less likely residual edema.
2. Cardiomegaly and small right pleural effusion.

## 2019-07-02 IMAGING — DX DG CHEST 1V PORT
1 series · 1 of 1 positions shown · non-contrast
Comparison: Chest x-ray 04/29/2018.

CLINICAL DATA: 67-year-old female with history of aortic valve and
mitral valve replacement.

EXAM:
PORTABLE CHEST 1 VIEW

[chest]
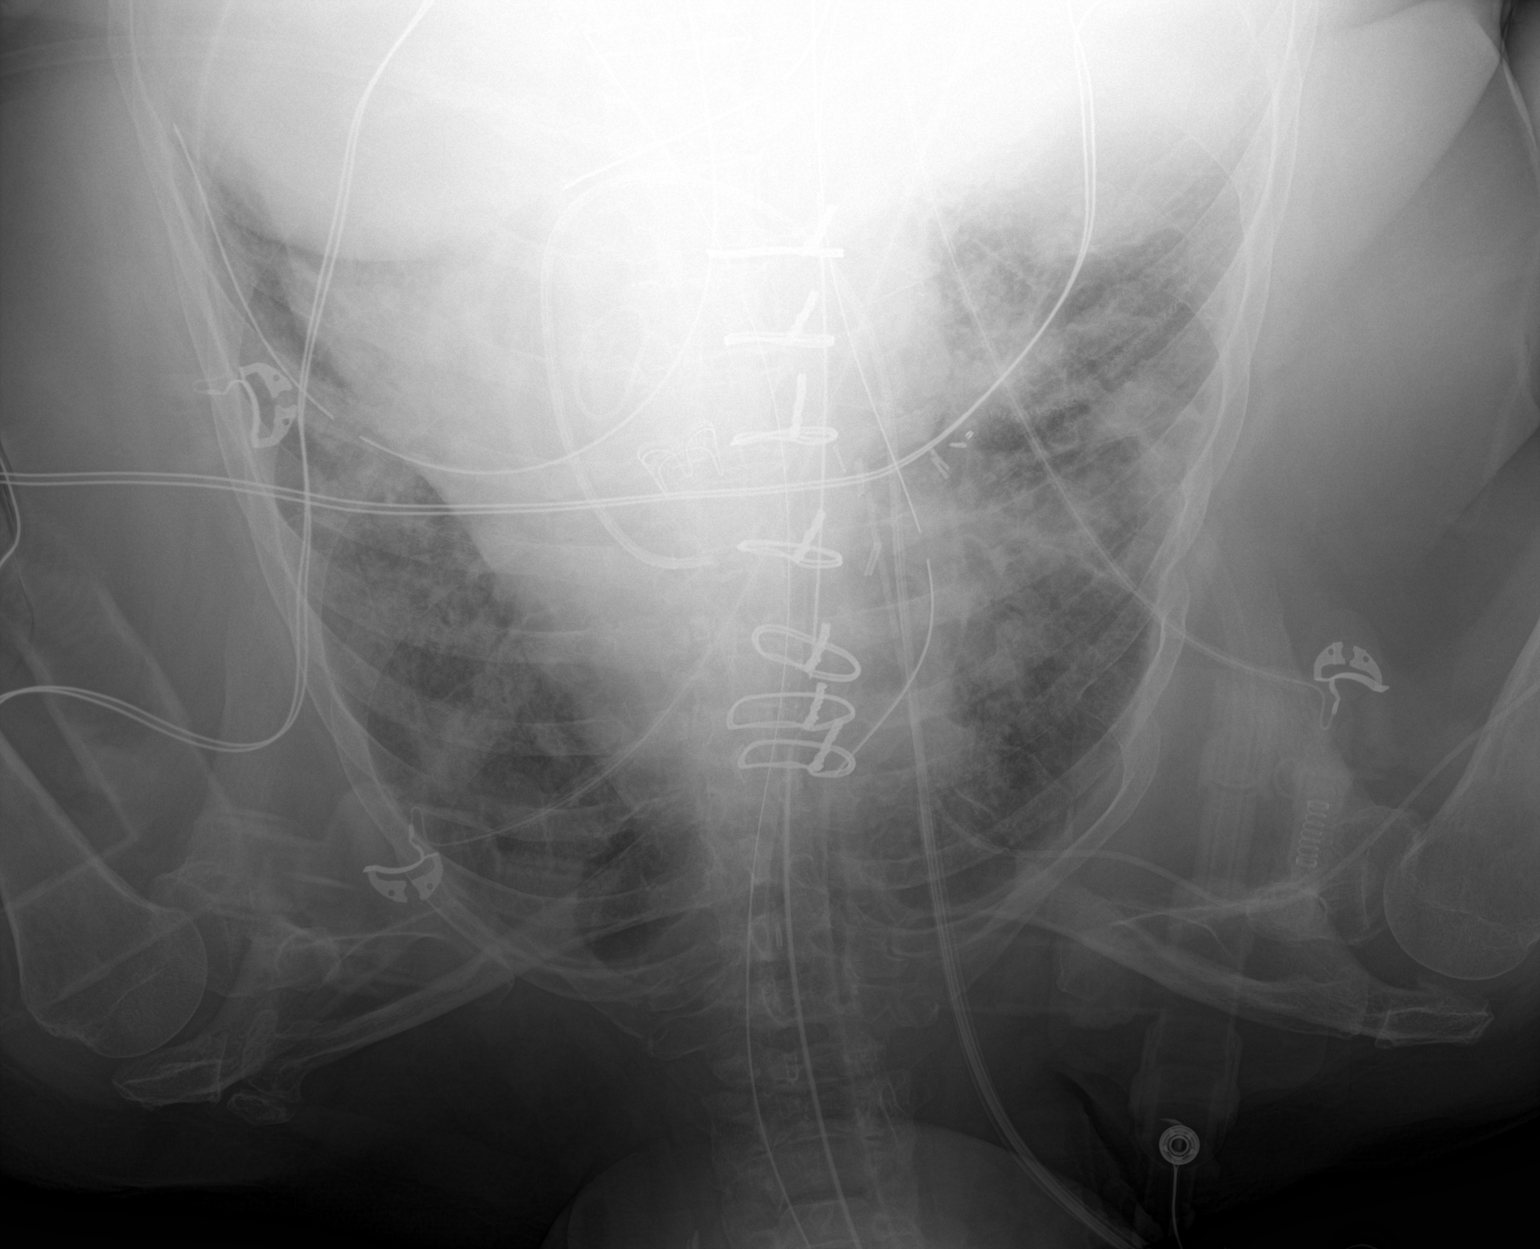

[1 of 1 positions shown; findings below may reference images not displayed]

FINDINGS: An endotracheal tube is in place with tip 3.8 cm above the carina.
There is a right upper extremity PICC with tip terminating in the
superior cavoatrial junction. Right IJ Cordis through which a
Swan-Ganz catheter has been passed into the proximal right main
pulmonary artery. Nasogastric tube extending into the upper abdomen.
Bilateral chest tubes in place. Mediastinal drain projecting over
the mid to lower thorax. Lung volumes are low. Diffuse interstitial
and airspace opacities throughout the lungs bilaterally, favored to
reflect underlying pulmonary edema. No appreciable pneumothorax.
Trace amount of pleural fluid bilaterally. Heart size is mildly
enlarged. Upper mediastinal contours are widened. New median
sternotomy wires. Bioprosthetic aortic valve. Postoperative changes
of mitral annuloplasty.
IMPRESSION: 1. Postoperative changes and support apparatus, as above.
2. The appearance of the lungs suggests pulmonary edema, as above.
3. Trace bilateral pleural effusions.

## 2019-07-02 IMAGING — DX DG CHEST 1V PORT
1 series · 1 of 1 positions shown · non-contrast
Comparison: Yesterday

CLINICAL DATA: Pulmonary edema.  Endocarditis.

EXAM:
PORTABLE CHEST 1 VIEW

[chest ap]
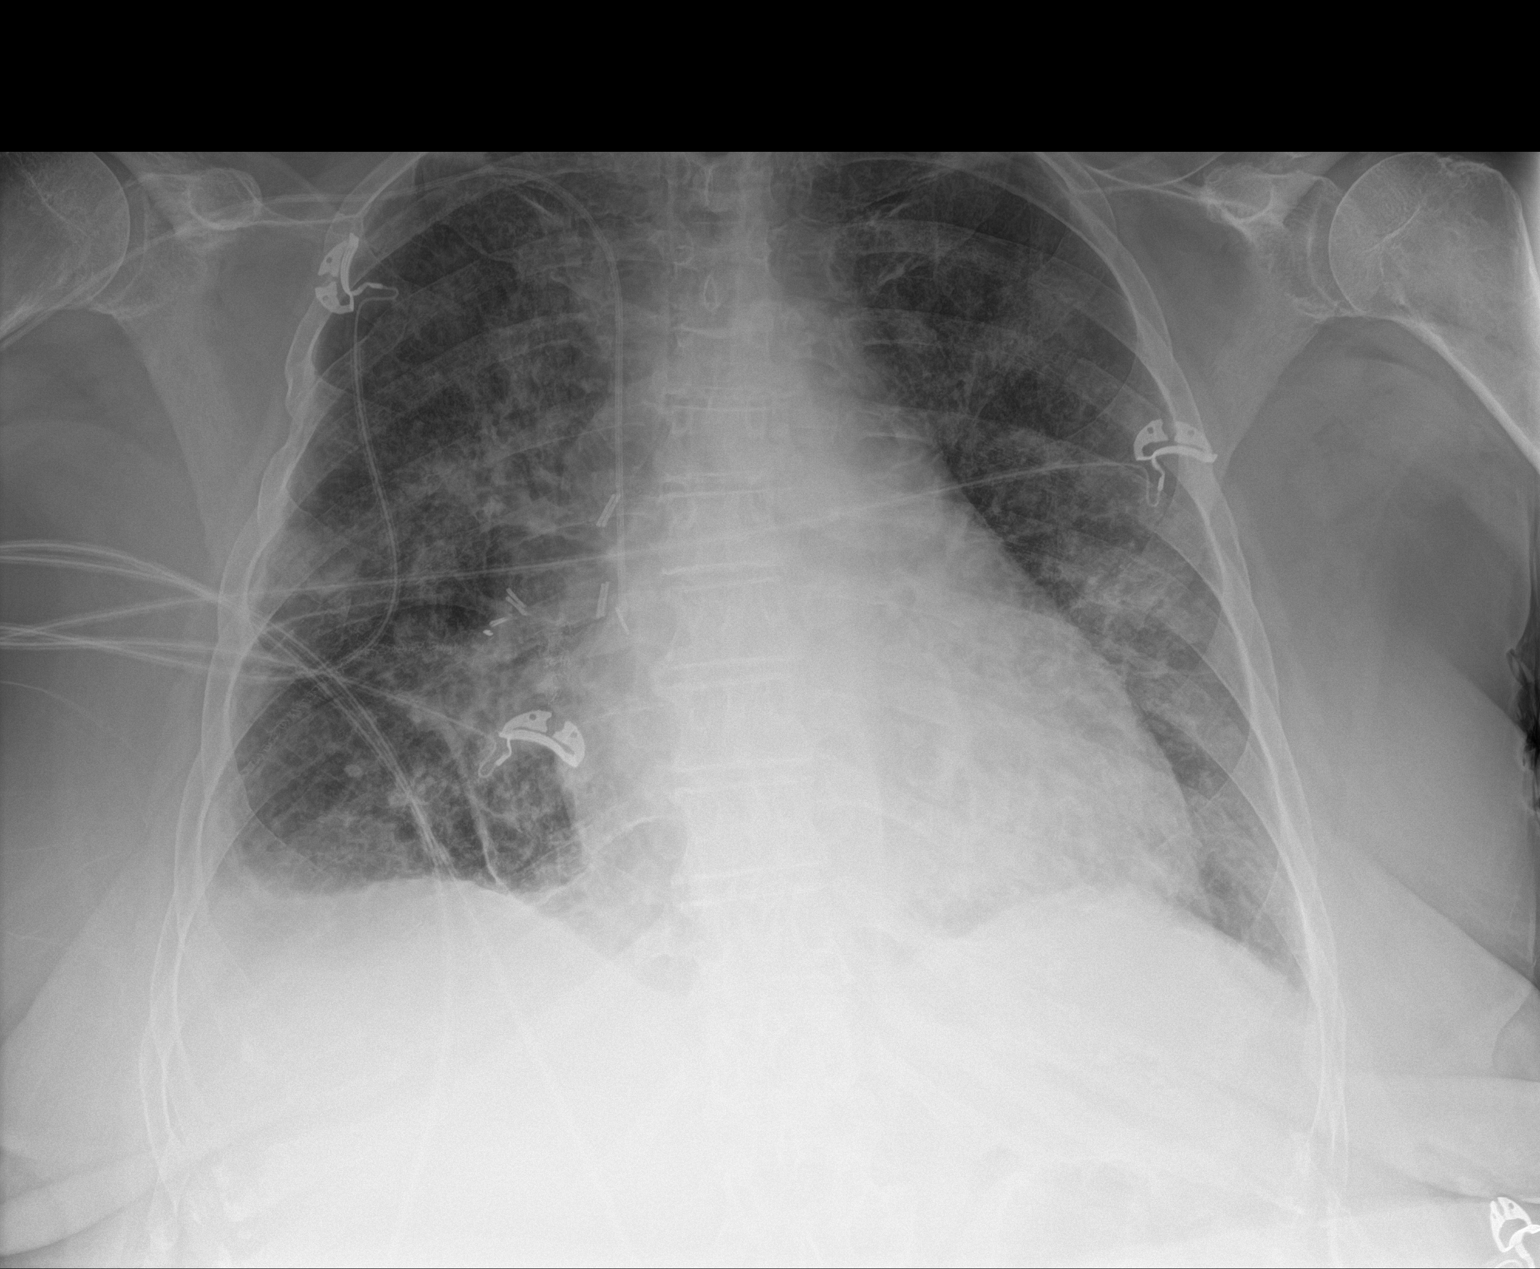

[1 of 1 positions shown; findings below may reference images not displayed]

FINDINGS: Right upper extremity PICC with tip at the upper cavoatrial
junction.

Bilateral airspace disease is improved but there is still extensive
interstitial and airspace opacity. This could be edema and/or
infection given the clinical setting. Postoperative right hilum.
Chronic cardiomegaly. Right costophrenic sulcus blunting which may
be scarring based on January 2018 CT.
IMPRESSION: 1. Mild improvement in airspace disease.
2. Stable cardiomegaly.

## 2019-07-03 IMAGING — DX DG CHEST 1V PORT
1 series · 1 of 1 positions shown · non-contrast
Comparison: Chest x-ray 04/29/2018.

CLINICAL DATA: 67-year-old female with history of aortic valve
replacement and mitral valve repair. Followup study.

EXAM:
PORTABLE CHEST 1 VIEW

[chest]
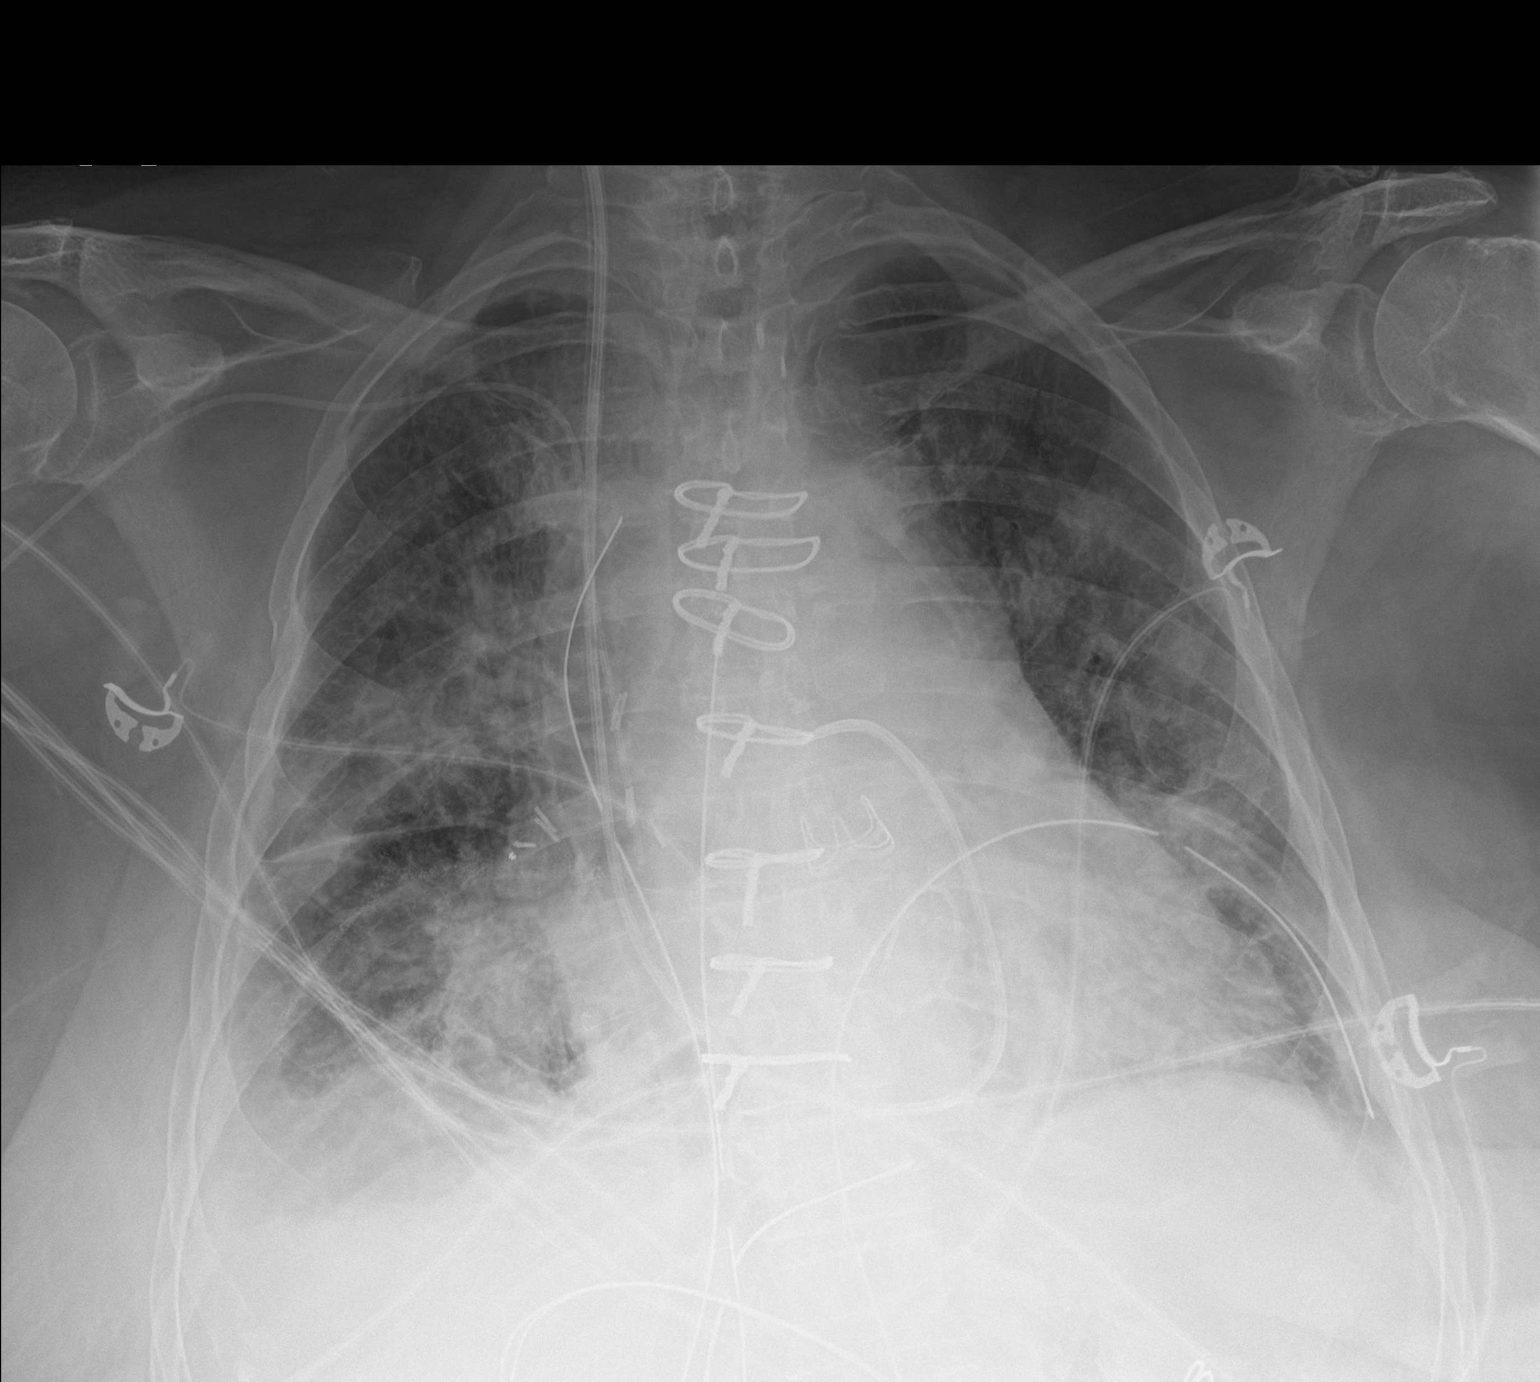

[1 of 1 positions shown; findings below may reference images not displayed]

FINDINGS: Patient has been extubated. Previously noted right upper extremity
PICC, Swan-Ganz catheter, chest tubes and mediastinal drains appear
in similar position to the prior study. Lung volumes remain slightly
low. Patchy multifocal interstitial and airspace disease noted
throughout the lungs bilaterally, with slight improvement in
aeration compared to yesterday's examination. Small bilateral
pleural effusions. No appreciable pneumothorax. Heart size is mildly
enlarged. Upper mediastinal contours are widened, within normal
limits for a postoperative patient.
IMPRESSION: 1. Stable support apparatus and postoperative changes. Overall
appearance the chest is very similar to the prior study, with slight
improved aeration, favored to reflect slightly improved pulmonary
edema.
